# Patient Record
Sex: Male | Born: 1955 | Race: Black or African American | Hispanic: No | Marital: Married | Smoking: Former smoker
Health system: Southern US, Community
[De-identification: ages and names within clinical notes are randomized; demographics above are authoritative.]

## PROBLEM LIST (undated history)

## (undated) DIAGNOSIS — J189 Pneumonia, unspecified organism: Secondary | ICD-10-CM

## (undated) DIAGNOSIS — N183 Chronic kidney disease, stage 3 unspecified: Secondary | ICD-10-CM

## (undated) DIAGNOSIS — I509 Heart failure, unspecified: Secondary | ICD-10-CM

## (undated) DIAGNOSIS — I10 Essential (primary) hypertension: Secondary | ICD-10-CM

## (undated) DIAGNOSIS — N186 End stage renal disease: Secondary | ICD-10-CM

## (undated) DIAGNOSIS — E1142 Type 2 diabetes mellitus with diabetic polyneuropathy: Secondary | ICD-10-CM

## (undated) DIAGNOSIS — I4891 Unspecified atrial fibrillation: Secondary | ICD-10-CM

## (undated) DIAGNOSIS — Z992 Dependence on renal dialysis: Secondary | ICD-10-CM

## (undated) DIAGNOSIS — G4730 Sleep apnea, unspecified: Secondary | ICD-10-CM

## (undated) DIAGNOSIS — R011 Cardiac murmur, unspecified: Secondary | ICD-10-CM

## (undated) DIAGNOSIS — E119 Type 2 diabetes mellitus without complications: Secondary | ICD-10-CM

## (undated) DIAGNOSIS — F1011 Alcohol abuse, in remission: Secondary | ICD-10-CM

## (undated) DIAGNOSIS — R569 Unspecified convulsions: Secondary | ICD-10-CM

## (undated) HISTORY — DX: Chronic kidney disease, stage 3 (moderate): N18.3

## (undated) HISTORY — DX: Chronic kidney disease, stage 3 unspecified: N18.30

## (undated) HISTORY — DX: Type 2 diabetes mellitus without complications: E11.9

## (undated) HISTORY — DX: Unspecified convulsions: R56.9

## (undated) HISTORY — DX: Alcohol abuse, in remission: F10.11

## (undated) HISTORY — DX: Unspecified atrial fibrillation: I48.91

## (undated) HISTORY — DX: End stage renal disease: N18.6

## (undated) HISTORY — DX: Heart failure, unspecified: I50.9

## (undated) HISTORY — DX: Essential (primary) hypertension: I10

## (undated) HISTORY — PX: COLONOSCOPY W/ BIOPSIES AND POLYPECTOMY: SHX1376

## (undated) HISTORY — DX: Dependence on renal dialysis: Z99.2

## (undated) HISTORY — DX: Type 2 diabetes mellitus with diabetic polyneuropathy: E11.42

---

## 1998-03-29 ENCOUNTER — Ambulatory Visit (HOSPITAL_COMMUNITY): Admission: RE | Admit: 1998-03-29 | Discharge: 1998-03-29 | Payer: Self-pay | Admitting: Cardiology

## 1998-03-29 ENCOUNTER — Encounter: Payer: Self-pay | Admitting: Cardiology

## 2000-01-05 ENCOUNTER — Emergency Department (HOSPITAL_COMMUNITY): Admission: EM | Admit: 2000-01-05 | Discharge: 2000-01-05 | Payer: Self-pay | Admitting: Emergency Medicine

## 2008-07-17 ENCOUNTER — Emergency Department (HOSPITAL_COMMUNITY): Admission: EM | Admit: 2008-07-17 | Discharge: 2008-07-18 | Payer: Self-pay | Admitting: Emergency Medicine

## 2009-08-26 ENCOUNTER — Ambulatory Visit (HOSPITAL_COMMUNITY): Admission: RE | Admit: 2009-08-26 | Discharge: 2009-08-26 | Payer: Self-pay | Admitting: Psychiatry

## 2009-08-28 ENCOUNTER — Other Ambulatory Visit (HOSPITAL_COMMUNITY): Admission: RE | Admit: 2009-08-28 | Discharge: 2009-10-31 | Payer: Self-pay | Admitting: Psychiatry

## 2009-09-25 ENCOUNTER — Ambulatory Visit: Payer: Self-pay | Admitting: Psychiatry

## 2010-07-06 LAB — URINE DRUGS OF ABUSE SCREEN W ALC, ROUTINE (REF LAB)
Amphetamine Screen, Ur: NEGATIVE
Amphetamine Screen, Ur: NEGATIVE
Amphetamine Screen, Ur: NEGATIVE
Barbiturate Quant, Ur: NEGATIVE
Barbiturate Quant, Ur: NEGATIVE
Barbiturate Quant, Ur: NEGATIVE
Benzodiazepines.: NEGATIVE
Benzodiazepines.: NEGATIVE
Benzodiazepines.: NEGATIVE
Cocaine Metabolites: NEGATIVE
Cocaine Metabolites: NEGATIVE
Cocaine Metabolites: NEGATIVE
Creatinine,U: 88.4 mg/dL
Creatinine,U: 102 mg/dL
Creatinine,U: 82.9 mg/dL
Ethyl Alcohol: <10 mg/dL (ref <10)
Ethyl Alcohol: <10 mg/dL (ref <10)
Ethyl Alcohol: <10 mg/dL (ref <10)
Marijuana Metabolite: NEGATIVE
Marijuana Metabolite: NEGATIVE
Marijuana Metabolite: NEGATIVE
Methadone: NEGATIVE
Methadone: NEGATIVE
Methadone: NEGATIVE
Opiate Screen, Urine: NEGATIVE
Opiate Screen, Urine: NEGATIVE
Opiate Screen, Urine: NEGATIVE
Phencyclidine (PCP): NEGATIVE
Phencyclidine (PCP): NEGATIVE
Phencyclidine (PCP): NEGATIVE
Propoxyphene: NEGATIVE
Propoxyphene: NEGATIVE
Propoxyphene: NEGATIVE

## 2010-07-07 LAB — URINE DRUGS OF ABUSE SCREEN W ALC, ROUTINE (REF LAB)
Amphetamine Screen, Ur: NEGATIVE
Amphetamine Screen, Ur: NEGATIVE
Amphetamine Screen, Ur: NEGATIVE
Amphetamine Screen, Ur: NEGATIVE
Amphetamine Screen, Ur: NEGATIVE
Barbiturate Quant, Ur: NEGATIVE
Barbiturate Quant, Ur: NEGATIVE
Barbiturate Quant, Ur: NEGATIVE
Barbiturate Quant, Ur: NEGATIVE
Benzodiazepines.: NEGATIVE
Benzodiazepines.: NEGATIVE
Benzodiazepines.: NEGATIVE
Benzodiazepines.: NEGATIVE
Benzodiazepines.: NEGATIVE
Cocaine Metabolites: NEGATIVE
Cocaine Metabolites: NEGATIVE
Cocaine Metabolites: NEGATIVE
Cocaine Metabolites: NEGATIVE
Cocaine Metabolites: NEGATIVE
Creatinine,U: 138.7 mg/dL
Creatinine,U: 88.4 mg/dL
Creatinine,U: 75.7 mg/dL
Creatinine,U: 84.7 mg/dL
Ethyl Alcohol: <10 mg/dL
Ethyl Alcohol: <10 mg/dL
Ethyl Alcohol: <10 mg/dL
Ethyl Alcohol: <10 mg/dL
Marijuana Metabolite: NEGATIVE
Marijuana Metabolite: NEGATIVE
Marijuana Metabolite: NEGATIVE
Marijuana Metabolite: NEGATIVE
Marijuana Metabolite: NEGATIVE
Methadone: NEGATIVE
Methadone: NEGATIVE
Methadone: NEGATIVE
Methadone: NEGATIVE
Methadone: NEGATIVE
Opiate Screen, Urine: NEGATIVE
Opiate Screen, Urine: NEGATIVE
Opiate Screen, Urine: NEGATIVE
Opiate Screen, Urine: NEGATIVE
Opiate Screen, Urine: NEGATIVE
Phencyclidine (PCP): NEGATIVE
Phencyclidine (PCP): NEGATIVE
Phencyclidine (PCP): NEGATIVE
Phencyclidine (PCP): NEGATIVE
Propoxyphene: NEGATIVE
Propoxyphene: NEGATIVE
Propoxyphene: NEGATIVE
Propoxyphene: NEGATIVE
Propoxyphene: NEGATIVE

## 2011-04-25 ENCOUNTER — Other Ambulatory Visit: Payer: Self-pay | Admitting: Cardiology

## 2011-06-13 ENCOUNTER — Other Ambulatory Visit: Payer: Self-pay | Admitting: Cardiology

## 2011-07-16 ENCOUNTER — Other Ambulatory Visit: Payer: Self-pay | Admitting: Cardiology

## 2011-09-21 ENCOUNTER — Encounter: Payer: Self-pay | Admitting: *Deleted

## 2011-09-24 ENCOUNTER — Ambulatory Visit (INDEPENDENT_AMBULATORY_CARE_PROVIDER_SITE_OTHER): Payer: Medicare HMO | Admitting: Internal Medicine

## 2011-09-24 ENCOUNTER — Encounter: Payer: Self-pay | Admitting: Internal Medicine

## 2011-09-24 VITALS — BP 118/86 | HR 91 | Ht 72.0 in | Wt 232.0 lb

## 2011-09-24 DIAGNOSIS — I4891 Unspecified atrial fibrillation: Secondary | ICD-10-CM

## 2011-09-24 DIAGNOSIS — I10 Essential (primary) hypertension: Secondary | ICD-10-CM

## 2011-09-24 DIAGNOSIS — E119 Type 2 diabetes mellitus without complications: Secondary | ICD-10-CM

## 2011-09-24 DIAGNOSIS — E1121 Type 2 diabetes mellitus with diabetic nephropathy: Secondary | ICD-10-CM | POA: Insufficient documentation

## 2011-09-24 DIAGNOSIS — R609 Edema, unspecified: Secondary | ICD-10-CM

## 2011-09-24 MED ORDER — APIXABAN 5 MG PO TABS: 5.0000 mg | ORAL_TABLET | Freq: Two times a day (BID) | ORAL | Status: DC

## 2011-09-24 MED ORDER — LISINOPRIL-HYDROCHLOROTHIAZIDE 20-12.5 MG PO TABS: ORAL_TABLET | ORAL | Status: DC

## 2011-09-24 NOTE — Assessment & Plan Note (Signed)
Pt identified with asymptomatic atrial fibrillation   His CHADS score is 2 and thus he is appropriatedly treated with oral anticoagulation and I would choose a NOAC as opposed to coumadin  Apparently his Cr may be about 2 so  apixoban or Rivaroxaban would be better than coumadin  We will review lab work and see if a primary cause is idenified, ie thyroid or anemia and do an echo to look at LV and LA structure  We discussed side effects of blood thinners and would plan DCCV in 4 weeks or so

## 2011-09-24 NOTE — Patient Instructions (Signed)
Your physician has recommended you make the following change in your medication:  1) Stop exforge. 2) Start lisinopril/hct 20/12.5 mg one tablet by mouth twice daily. 3) You are being samples of Eliquis 5 mg samples to take- this will be twice daily. **Do not start Eliquis until we are able to obtain your lab work from Dr. Magda Kiel office.  Your physician has requested that you have an echocardiogram. Echocardiography is a painless test that uses sound waves to create images of your heart. It provides your doctor with information about the size and shape of your heart and how well your heart's chambers and valves are working. This procedure takes approximately one hour. There are no restrictions for this procedure.  Your physician has recommended that you have a Cardioversion (DCCV). Electrical Cardioversion uses a jolt of electricity to your heart either through paddles or wired patches attached to your chest. This is a controlled, usually prescheduled, procedure. Defibrillation is done under light anesthesia in the hospital, and you usually go home the day of the procedure. This is done to get your heart back into a normal rhythm. You are not awake for the procedure. Please see the instruction sheet given to you today.- Dr. Odessa Fleming nurse will be in touch with you about scheduling this.

## 2011-09-24 NOTE — Assessment & Plan Note (Addendum)
This may be related to his amlodipine. We will change his blood pressure medications to discontinue the amlodipine and put him on lisinopril/HCT. We will begin this following use of his current prescription

## 2011-09-24 NOTE — Progress Notes (Signed)
CARDIOLOGY CONSULT NOTE  Patient ID: Joseph [name], MRN: [id number], DOB/AGE: 56-Jul-1957 56 y.o. Admit date: (Not on file) Date of Consult: 09/24/2011  Primary Physician: No primary provider on file. Primary Cardiologist: JSpruill  Chief Complaint: Afib   HPI Joseph [name] is a 56 y.o. male : Seen at the request of Dr. Shana Chute because of newly identified asymptomatic atrial fibrillation.  He is a past history of diabetes and hypertension which has been complicated by peripheral edema with the increasing dosing of his CHADS score is 2.  Been no change in his exercise tolerance. He snores but has no daytime somnolence or poor resting.    Past Medical History  Diagnosis Date  . Atrial fibrillation     newly diagnosed?  . H/O alcohol abuse   . Diabetes mellitus   . Hypertension       Surgical History: No past surgical history on file.   Home Meds: Prior to Admission medications   Medication Sig Start Date End Date Taking? Authorizing Provider  APIDRA 100 UNIT/ML injection Inject 25 Units into the skin daily.  09/09/11  Yes Historical Provider, MD  DUTOPROL 100-12.5 MG TB24 Take 1 tablet by mouth daily.  09/19/11  Yes Historical Provider, MD  EXFORGE 10-320 MG per tablet Take 1 tablet by mouth daily.  09/19/11  Yes Historical Provider, MD  LANTUS SOLOSTAR 100 UNIT/ML injection Inject into the skin. Sliding scale  09/19/11  Yes Historical Provider, MD  LIPITOR 20 MG tablet Take 20 mg by mouth daily.  09/12/11  Yes Historical Provider, MD    Allergies: No Known Allergies  History   Social History  . Marital Status: Married    Spouse Name: N/A    Number of Children: N/A  . Years of Education: N/A   Occupational History  . Not on file.   Social History Main Topics  . Smoking status: Former Games developer  . Smokeless tobacco: Not on file   Comment: some in college  . Alcohol Use: Yes     occasionally beer  . Drug Use: No  . Sexually Active: Not on file   Other Topics Concern  .  Not on file   Social History Narrative  . No narrative on file     Family History  Problem Relation Age of Onset  . Heart disease Mother      ROS:  Please see the history of present illness.   Negative except glasses  All other systems reviewed and negative.    Physical Exam: Blood pressure 118/86, pulse 91, height 6' (1.829 m), weight 232 lb (105.235 kg), SpO2 99.00%. General: Well developed, well nourished male in no acute distress. Head: Normocephalic, atraumatic, sclera non-icteric, no xanthomas, nares are without discharge. Lymph Nodes:  none Neck: Negative for carotid bruits. JVD not elevated. Lungs: Clear bilaterally to auscultation without wheezes, rales, or rhonchi. Breathing is unlabored. Heart: Irregular oh RR with S1 S2. No murmurs, rubs, or gallops appreciated. Abdomen: Soft, non-tender, non-distended with normoactive bowel sounds. No hepatomegaly. No rebound/guarding. No obvious abdominal masses. Msk:  Strength and tone appear normal for age. Extremities: No clubbing or cyanosis.  1+ edema.  Distal pedal pulses are 2+ and equal bilaterally. Skin: Warm and Dry Neuro: Alert and oriented X 3. CN III-XII intact Grossly normal sensory and motor function . Psych:  Responds to questions appropriately with a normal affect.       EKG: Atrial fibrillation with a controlled ventricular response   Assessment and Plan:  [name]

## 2011-09-24 NOTE — Assessment & Plan Note (Signed)
As above.

## 2011-09-28 ENCOUNTER — Telehealth: Payer: Self-pay | Admitting: *Deleted

## 2011-09-28 NOTE — Telephone Encounter (Signed)
I spoke with the patient about his lab results and that it is ok to start Eliquis 5 mg twice daily per Dr. Graciela Husbands. I have scheduled him for his echo to be done on 10/05/11. I will call him back tomorrow about setting up his DCCV for 4 weeks outl. He will see if 10/30/11 will work with his wife's schedule.

## 2011-09-28 NOTE — Telephone Encounter (Signed)
Left VM for patient to call to schedule Echocardiogram.

## 2011-09-29 ENCOUNTER — Emergency Department (HOSPITAL_COMMUNITY)
Admission: EM | Admit: 2011-09-29 | Discharge: 2011-09-29 | Disposition: A | Discharge status: Home / Self Care | Payer: Managed Care, Other (non HMO) | Attending: Emergency Medicine | Admitting: Emergency Medicine

## 2011-09-29 ENCOUNTER — Encounter (HOSPITAL_COMMUNITY): Payer: Self-pay | Admitting: *Deleted

## 2011-09-29 DIAGNOSIS — H3320 Serous retinal detachment, unspecified eye: Secondary | ICD-10-CM | POA: Insufficient documentation

## 2011-09-29 DIAGNOSIS — Z87891 Personal history of nicotine dependence: Secondary | ICD-10-CM | POA: Insufficient documentation

## 2011-09-29 DIAGNOSIS — H3322 Serous retinal detachment, left eye: Secondary | ICD-10-CM

## 2011-09-29 DIAGNOSIS — I10 Essential (primary) hypertension: Secondary | ICD-10-CM | POA: Insufficient documentation

## 2011-09-29 DIAGNOSIS — Z794 Long term (current) use of insulin: Secondary | ICD-10-CM | POA: Insufficient documentation

## 2011-09-29 DIAGNOSIS — E119 Type 2 diabetes mellitus without complications: Secondary | ICD-10-CM | POA: Insufficient documentation

## 2011-09-29 DIAGNOSIS — I4891 Unspecified atrial fibrillation: Secondary | ICD-10-CM | POA: Insufficient documentation

## 2011-09-29 LAB — GLUCOSE, CAPILLARY: Glucose-Capillary: 200 mg/dL — ABNORMAL HIGH (ref 70–99)

## 2011-09-29 NOTE — ED Provider Notes (Signed)
History     CSN: [id number]  Arrival date & time 09/29/11  4098   First MD Initiated Contact with Patient 09/29/11 0530      Chief Complaint  Patient presents with  . Eye Problem    (Consider location/radiation/quality/duration/timing/severity/associated sxs/prior treatment) HPI 56 yo male presents to the ER with complaint of left eye vision problems.  Pt reports he woke this morning around 3 am, and noticed while watching TV thought the screen was dirty.  When patient tried to wipe off the screen, he realized that it was his vision.  Pt reports a general haze in his left eye vision along with black lines.  Pt with h/o diabetes, has seen ophtho in the past and was seen by an eye specialist, but has not had recent visit and does not know their names.  Pt with poor vision at baseline, but does report his left eye is worse than usual.  No prior h/o floaters or retinal detachment.  Pt reports he feels like the arteries in his eye have "blown up".    Past Medical History  Diagnosis Date  . Atrial fibrillation     newly diagnosed?  . H/O alcohol abuse   . Hypertension   . Diabetes mellitus     History reviewed. No pertinent past surgical history.  Family History  Problem Relation Age of Onset  . Heart disease Mother     History  Substance Use Topics  . Smoking status: Former Games developer  . Smokeless tobacco: Not on file   Comment: some in college  . Alcohol Use: Yes     occasionally beer      Review of Systems  All other systems reviewed and are negative.    Allergies  Review of patient's allergies indicates no known allergies.  Home Medications   Current Outpatient Rx  Name Route Sig Dispense Refill  . AMLODIPINE BESYLATE-VALSARTAN 10-320 MG PO TABS Oral Take 1 tablet by mouth daily.    . APIDRA 100 UNIT/ML IJ SOLN Subcutaneous Inject 25 Units into the skin daily.     . APIXABAN 5 MG PO TABS Oral Take 1 tablet (5 mg total) by mouth 2 (two) times daily. 60 tablet 11    . DUTOPROL 100-12.5 MG PO TB24 Oral Take 1 tablet by mouth daily.     Marland Kitchen LANTUS SOLOSTAR 100 UNIT/ML  SOLN Subcutaneous Inject into the skin. Sliding scale     . LIPITOR 20 MG PO TABS Oral Take 20 mg by mouth daily.       BP 143/90  Pulse 69  Temp(Src) 97.5 F (36.4 C) (Oral)  Resp 19  SpO2 97%  Physical Exam  Constitutional: He is oriented to person, place, and time. He appears well-developed and well-nourished. No distress.  HENT:  Head: Normocephalic and atraumatic.  Nose: Nose normal.  Mouth/Throat: Oropharynx is clear and moist. No oropharyngeal exudate.  Eyes: Conjunctivae and EOM are normal. Pupils are equal, round, and reactive to light. Right eye exhibits no discharge. Left eye exhibits no discharge. No scleral icterus.       Fundoscopic exam shows av nicking bilaterally, cotton wool spots.  Left eye with possible retinal detachment, but pt not tolerating exam well  Neck: Normal range of motion. Neck supple. No JVD present. No tracheal deviation present. No thyromegaly present.  Pulmonary/Chest: No stridor.  Musculoskeletal: Normal range of motion. He exhibits no edema and no tenderness.  Lymphadenopathy:    He has no cervical adenopathy.  Neurological: He  is alert and oriented to person, place, and time. He exhibits normal muscle tone. Coordination normal.  Skin: Skin is warm and dry. No rash noted. He is not diaphoretic. No erythema. No pallor.    ED Course  Procedures (including critical care time)  Labs Reviewed - No data to display No results found.   1. Retinal detachment, left       MDM  56 yo male with acute onset of reduced visual acuity, concern for retinal detachment.  D/w Dr Luciana Axe who will see the patient in his office at 0830.  He requests the patient not eat or drink until being seen by him.  Pt requesting later appointment so that he can have breakfast, as he hasn't eaten all night.  Explained to patient the need for NPO given potential  intervention this am.  Will check cbg.         Olivia Mackie, MD 09/29/11 [phone]

## 2011-09-29 NOTE — ED Notes (Signed)
Pt ambulated with a steady gait; VSS; A&Ox3; no signs of distress; respirations even and unlabored; skin warm and dry; no questions at this time; pt will follow up today.

## 2011-09-29 NOTE — ED Notes (Signed)
Pt presented to ED with lt eye blurred vision .Pt was watching TV and experienced sudden change with vision of the left eye.No other complains.Pt wears eye glasses all the time.

## 2011-09-29 NOTE — Discharge Instructions (Signed)
Please go to Dr Rankin's office this morning to be seen at 0830.  It is very important to keep this appointment.  DO NOT EAT OR DRINK PRIOR TO THIS APPOINTMENT  Retinal Detachment The retina is a thin light-sensitive layer that lines the inside of the back of the eye. The retina changes visual images into nerve impulses which are sent to the brain by the optic nerve to produce sight. It must be attached to the back of the eye in order to function. All detailed vision and color perception happens at a precise pin size point of focus on the retina called the macula. The other 99.9% of retinal surface is for side vision (what your eye can see just off to the side of what you are looking at). A detached retina is the separation of the retina from the back of the eye. A total retinal detachment is when the entire retina separates from the layers below it, and includes the macula. This causes complete blindness in the affected eye. It is more common for just a portion of the retina to detach, in many cases leaving the macula in place. When this happens, central, focused vision is maintained, but a portion of side vision corresponding to the part of the retina that detached is lost. This results in a portion of side vision that is black - like a curtain covering a portion of side vision. CAUSES   One or many small holes or tears in the retinal surface. These holes and tears allow the fluids inside the eyeball (vitreous fluid) to get through the opening and lift the retina up and away from its underlying layers. Since the vitreous fluid is jelly-like in nature and constricts with time, it has the affect of pulling on the retina and can cause holes or tears. People who are very near sighted are at a higher risk for retinal holes or tears.   Trauma to the eye.   Certain diseases (diabetes, blood clotting disorders and others).   Certain physical abnormalities of the eye.  SYMPTOMS   Flashes of lights that are  caused by the vitreous pulling on the retina.   Floating specks or cobwebs (floaters) seen in front of your eye. These may be caused by bleeding in the eye from a hole or tear of the retina.   A black area or "curtain" that you cannot see through in any part of your side vision.   Detailed vision becomes fuzzy which may be caused by the vitreous pulling on the macula.   A floating empty circle in front of your vision which may be caused by the vitreous pulling away from the optic nerve. This is very common and usually does not affect vision (posterior vitreal detachment).  DIAGNOSIS  The diagnosis is made with an exam by an ophthalmologist. The pupils of the patient are made larger (dilated). Retinal detachment can be more easily found if the detachment is large. If it is small or flat, the detachment may be harder to find. Location of retinal holes and tears need an extremely high degree of training and skill. They may need the expertise of an ophthalmologist who specializes in diseases of the retina. Both eyes should be thoroughly examined since holes or tears may exist in both eyes. TREATMENT  Treatment depends on the location, size and nature of the retinal detachment.  Small, localized detachments may be treated on an outpatient basis using a laser.   Larger detachments need surgery to  drain the fluid and use a freezing probe to create scar tissue. The scar tissue will help make the retina adhere to its underlying tissues once it flattens out.   Methods also exist which involve the injection of fluid or air into the vitreous cavity.  Visual improvement or return depends on:  How long the retina was detached.   Whether or not the macula was involved.   How well any blood within the vitreous cavity clears with time - and many other factors.  HOME CARE INSTRUCTIONS   If you have a retinal detachment, and you have not been examined by an eye specialist (either an Ophthalmologist or a  Retinal Specialist), you must be examined by one of these specialists as soon as possible.   Home care instruction should be given by the retina specialist depending on the type of detachment and the surgery or method that was used to treat it.  SEEK IMMEDIATE MEDICAL CARE IF:   You see the sudden onset of flashing lights, floaters and/or a dark area (like a curtain) in any area of your field of version.   The dark area of visual loss is in the lower part of the visual field. This means that the upper retina may have detached. Since fluid runs down, the risk is greater in this type of detachment that the fluid may work its way downward to detach the macula. Any detachment that involves the macula makes the risk of permanent visual loss much higher, even with treatment.  Document Released: 04/06/2005 Document Revised: 03/26/2011 Document Reviewed: 03/08/2009 [facility] Patient Information 2012 [place], [place].

## 2011-09-29 NOTE — ED Notes (Signed)
The pt has floaters in his lt eye.  He has strings in his lt eye vision that appears to be blood

## 2011-09-29 NOTE — Telephone Encounter (Signed)
Per Soledad Gerlach at checkin, there should not be a co-pay for the patient's echo. I have left a message for him to call about this and to schedule a DCCV for 4 weeks out.

## 2011-10-05 ENCOUNTER — Ambulatory Visit (HOSPITAL_COMMUNITY): Payer: Managed Care, Other (non HMO) | Attending: Cardiology

## 2011-10-05 DIAGNOSIS — I4891 Unspecified atrial fibrillation: Secondary | ICD-10-CM

## 2011-10-05 DIAGNOSIS — E119 Type 2 diabetes mellitus without complications: Secondary | ICD-10-CM | POA: Insufficient documentation

## 2011-10-05 DIAGNOSIS — I10 Essential (primary) hypertension: Secondary | ICD-10-CM

## 2011-10-05 DIAGNOSIS — R609 Edema, unspecified: Secondary | ICD-10-CM

## 2011-10-05 DIAGNOSIS — F1010 Alcohol abuse, uncomplicated: Secondary | ICD-10-CM | POA: Insufficient documentation

## 2011-10-05 NOTE — Progress Notes (Signed)
Echocardiogram performed.  

## 2011-10-27 ENCOUNTER — Ambulatory Visit: Payer: Managed Care, Other (non HMO) | Admitting: Internal Medicine

## 2011-10-27 ENCOUNTER — Encounter: Payer: Self-pay | Admitting: Internal Medicine

## 2011-10-27 ENCOUNTER — Ambulatory Visit (INDEPENDENT_AMBULATORY_CARE_PROVIDER_SITE_OTHER): Payer: Managed Care, Other (non HMO) | Admitting: Internal Medicine

## 2011-10-27 ENCOUNTER — Encounter: Payer: Self-pay | Admitting: *Deleted

## 2011-10-27 VITALS — BP 132/90 | HR 88 | Resp 18 | Ht 72.0 in | Wt 227.8 lb

## 2011-10-27 DIAGNOSIS — I4891 Unspecified atrial fibrillation: Secondary | ICD-10-CM

## 2011-10-27 DIAGNOSIS — I10 Essential (primary) hypertension: Secondary | ICD-10-CM

## 2011-10-27 DIAGNOSIS — N289 Disorder of kidney and ureter, unspecified: Secondary | ICD-10-CM

## 2011-10-27 DIAGNOSIS — E785 Hyperlipidemia, unspecified: Secondary | ICD-10-CM

## 2011-10-27 DIAGNOSIS — G4733 Obstructive sleep apnea (adult) (pediatric): Secondary | ICD-10-CM

## 2011-10-27 DIAGNOSIS — G4730 Sleep apnea, unspecified: Secondary | ICD-10-CM

## 2011-10-27 NOTE — Patient Instructions (Addendum)
Your physician recommends that you have lab work today: bmp/tsh  Your physician has recommended that you have a sleep study (home study). This test records several body functions during sleep, including: brain activity, eye movement, oxygen and carbon dioxide blood levels, heart rate and rhythm, breathing rate and rhythm, the flow of air through your mouth and nose, snoring, body muscle movements, and chest and belly movement.  You have been referred to : Nephrology (renal).  Your physician has recommended that you have a Cardioversion (DCCV). Electrical Cardioversion uses a jolt of electricity to your heart either through paddles or wired patches attached to your chest. This is a controlled, usually prescheduled, procedure. Defibrillation is done under light anesthesia in the hospital, and you usually go home the day of the procedure. This is done to get your heart back into a normal rhythm. You are not awake for the procedure. Please see the instruction sheet given to you today.

## 2011-10-27 NOTE — Assessment & Plan Note (Signed)
Clinically has a diagnosis. We will try and see if he qualifies for home sleep study; otherwise we will send him to the hospital lab

## 2011-10-27 NOTE — Progress Notes (Signed)
  HPI  Joseph [name] is a 56 y.o. male  Seen in followup for atrial fibrillation newly identified in June. We anticipate cardioversion on the introduction of apixoban . Echocardiogram demonstrated normal left ventricular function with mild left atrial enlargement.   He is modest renal insufficiency with a creatinine of 2.2; estimated creatinine clearance is 53  When I asked his wife whether he snores, she simply frowned and laughted  Past Medical History  Diagnosis Date  . Atrial fibrillation     newly diagnosed?  . H/O alcohol abuse   . Hypertension   . Diabetes mellitus     No past surgical history on file.  Current Outpatient Prescriptions  Medication Sig Dispense Refill  . APIDRA 100 UNIT/ML injection Inject 25 Units into the skin daily.       . apixaban (ELIQUIS) 5 MG TABS tablet Take 1 tablet (5 mg total) by mouth 2 (two) times daily.  60 tablet  11  . DUTOPROL 100-12.5 MG TB24 Take 1 tablet by mouth daily.       . LANTUS SOLOSTAR 100 UNIT/ML injection Inject 50 Units into the skin. Sliding scale      . LIPITOR 20 MG tablet Take 20 mg by mouth daily.       . lisinopril-hydrochlorothiazide (PRINZIDE,ZESTORETIC) 20-12.5 MG per tablet         No Known Allergies  Review of Systems negative except from HPI and PMH  Physical Exam BP 132/90  Pulse 88  Resp 18  Ht 6' (1.829 m)  Wt 227 lb 12.8 oz (103.329 kg)  BMI 30.90 kg/m2  SpO2 99% Well developed and well nourished in no acute distress HENT normal E scleral and icterus clear Neck Supple JVP flat; carotids brisk and full Clear to ausculation irRegular rate and rhythm, no murmurs gallops or rub Soft with active bowel sounds No clubbing cyanosis none Edema Alert and oriented, grossly normal motor and sensory function Skin Warm and Dry    Assessment and  Plan  

## 2011-10-27 NOTE — Assessment & Plan Note (Signed)
Improved on current medications.

## 2011-10-27 NOTE — Assessment & Plan Note (Signed)
With his diabetic nephropathy, we will refer him to nephrology

## 2011-10-27 NOTE — Assessment & Plan Note (Signed)
LDL was over 200. We'll need to review his status

## 2011-10-27 NOTE — Assessment & Plan Note (Signed)
We'll check his TSH. We'll plan to proceed with cardioversion. He is currently taking apixoban and we need to keep track of his creatinine as it may affect his dosing

## 2011-10-28 LAB — BASIC METABOLIC PANEL
CO2: 26 mEq/L (ref 19–32)
Calcium: 8.4 mg/dL (ref 8.4–10.5)
Chloride: 106 mEq/L (ref 96–112)
Potassium: 4.3 mEq/L (ref 3.5–5.1)
Sodium: 139 mEq/L (ref 135–145)

## 2011-10-30 NOTE — Addendum Note (Signed)
Addended by: Lacie Scotts on: 10/30/2011 04:04 PM   Modules accepted: Orders

## 2011-11-02 ENCOUNTER — Encounter (HOSPITAL_COMMUNITY): Payer: Self-pay | Admitting: Pharmacy Technician

## 2011-11-02 ENCOUNTER — Encounter: Payer: Self-pay | Admitting: Internal Medicine

## 2011-11-03 ENCOUNTER — Telehealth: Payer: Self-pay | Admitting: Internal Medicine

## 2011-11-03 NOTE — Telephone Encounter (Signed)
I spoke with Victorino Dike at Dr. Ephriam Knuckles office. The patient is needing clearance to undergo a vitrectomy endo laser due to viseral hemorrhage and diabetic retinopathy. This would be done under local anesthesia and the patient would not need to stop his eliquis per Dr. Luciana Axe. Plans would be to do this next Wednesday. I advised I would discuss with Dr. Graciela Husbands for clearance and be back in touch with their office on Friday.

## 2011-11-03 NOTE — Telephone Encounter (Signed)
New problem:  Need cardiac clearance for rectum surgery.

## 2011-11-04 NOTE — Telephone Encounter (Signed)
Pt is currently on track for cardioversion  It is ok from cardiac perspective to have his retinal surgery, but if it is done, it will cause to reset his DCCV schedule to 4 weeks post resumpton of the anticoagulation

## 2011-11-06 ENCOUNTER — Encounter: Payer: Self-pay | Admitting: *Deleted

## 2011-11-06 NOTE — Telephone Encounter (Signed)
I called Dr. Ephriam Knuckles office to verify the fax number we have of [phone] is correct. This is the right number, but I have been unable to get this fax to go through for clearance x 2 tries. I will re-attempt.

## 2011-11-06 NOTE — Telephone Encounter (Signed)
Letter faxed.

## 2011-11-09 ENCOUNTER — Other Ambulatory Visit: Payer: Self-pay | Admitting: Internal Medicine

## 2011-11-09 DIAGNOSIS — I4891 Unspecified atrial fibrillation: Secondary | ICD-10-CM

## 2011-11-11 ENCOUNTER — Other Ambulatory Visit: Payer: Self-pay | Admitting: Internal Medicine

## 2011-11-11 HISTORY — PX: OTHER SURGICAL HISTORY: SHX169

## 2011-11-17 ENCOUNTER — Telehealth: Payer: Self-pay | Admitting: *Deleted

## 2011-11-17 MED ORDER — SODIUM CHLORIDE 0.9 % IV SOLN: INTRAVENOUS | Status: AC

## 2011-11-17 NOTE — Telephone Encounter (Signed)
PT AWARE OF TIME CHANGE FOR CARDIOVERSION  FROM 10:00 TO 11:00 AM  PT TO ARRIVE AT HOSPITAL AT 9:00 AM INSTEAD OF 8:00 AM PT SOUNDED UPSET OVER PHONE RE CHANGING PROCEDURE INFORMED PT THAT MD  DOING PROCEDURE COULD NOT BE THERE AT  10:00AM PT HUNG UP ON THIS NURSE  WHEN WAS ABOUT  TO ASK PT IF NEEDED TO  CHANGE TO ANOTHER DAY .

## 2011-11-18 ENCOUNTER — Ambulatory Visit (HOSPITAL_COMMUNITY)
Admission: RE | Admit: 2011-11-18 | Discharge: 2011-11-18 | Disposition: A | Discharge status: Home / Self Care | Payer: Managed Care, Other (non HMO) | Source: Ambulatory Visit | Attending: Cardiovascular Disease | Admitting: Cardiovascular Disease

## 2011-11-18 ENCOUNTER — Encounter (HOSPITAL_COMMUNITY): Payer: Self-pay | Admitting: Critical Care Medicine

## 2011-11-18 ENCOUNTER — Ambulatory Visit (HOSPITAL_COMMUNITY): Payer: Managed Care, Other (non HMO) | Admitting: Critical Care Medicine

## 2011-11-18 ENCOUNTER — Encounter (HOSPITAL_COMMUNITY)
Admission: RE | Disposition: A | Discharge status: Home / Self Care | Payer: Self-pay | Source: Ambulatory Visit | Attending: Cardiovascular Disease

## 2011-11-18 ENCOUNTER — Encounter (HOSPITAL_COMMUNITY): Payer: Self-pay | Admitting: Gastroenterology

## 2011-11-18 DIAGNOSIS — I4891 Unspecified atrial fibrillation: Secondary | ICD-10-CM | POA: Insufficient documentation

## 2011-11-18 DIAGNOSIS — I10 Essential (primary) hypertension: Secondary | ICD-10-CM | POA: Insufficient documentation

## 2011-11-18 DIAGNOSIS — E119 Type 2 diabetes mellitus without complications: Secondary | ICD-10-CM | POA: Insufficient documentation

## 2011-11-18 HISTORY — PX: CARDIOVERSION: SHX1299

## 2011-11-18 HISTORY — DX: Cardiac murmur, unspecified: R01.1

## 2011-11-18 SURGERY — CARDIOVERSION
Anesthesia: General

## 2011-11-18 MED ORDER — MIDAZOLAM HCL 10 MG/2ML IJ SOLN
INTRAMUSCULAR | Status: AC
  Filled 2011-11-18: qty 2

## 2011-11-18 MED ORDER — SODIUM CHLORIDE 0.9 % IV SOLN
INTRAVENOUS | Status: DC
  Administered 2011-11-18: 500 mL via INTRAVENOUS

## 2011-11-18 MED ORDER — FENTANYL CITRATE 0.05 MG/ML IJ SOLN
INTRAMUSCULAR | Status: AC
  Filled 2011-11-18: qty 2

## 2011-11-18 MED ORDER — PROPOFOL 10 MG/ML IV EMUL
INTRAVENOUS | Status: DC | PRN
  Administered 2011-11-18 (×2): 80 mg via INTRAVENOUS

## 2011-11-18 NOTE — CV Procedure (Signed)
NPO On Rx Eloquis BS 174 Rhythm Atrial fibrillation/flutter rate 110 Anesthesia Dr Randa Evens 120 mg Propofol Single 150J biphasic shock Converted to NSR rate 72 No immediate neurologic sequelae  F/U Dr Blaine Hamper in 3-4 weeks  Charlton Haws 11:26 AM 11/18/2011

## 2011-11-18 NOTE — Interval H&P Note (Signed)
History and Physical Interval Note:  11/18/2011 10:52 AM  [name]  has presented today for surgery, with the diagnosis of AFIB  The various methods of treatment have been discussed with the patient and family. After consideration of risks, benefits and other options for treatment, the patient has consented to  Procedure(s) (LRB): CARDIOVERSION (N/A) as a surgical intervention .  The patient's history has been reviewed, patient examined, no change in status, stable for surgery.  I have reviewed the patient's chart and labs.  Questions were answered to the patient's satisfaction.     Joseph [name]

## 2011-11-18 NOTE — Transfer of Care (Addendum)
Immediate Anesthesia Transfer of Care Note  Patient: Joseph [name]  Procedure(s) Performed: Procedure(s) (LRB): CARDIOVERSION (N/A)  Patient Location: endoscopy unit  Anesthesia Type: General  Level of Consciousness: awake, alert  and oriented  Airway & Oxygen Therapy: Patient Spontanous Breathing and Patient connected to nasal cannula oxygen  Post-op Assessment: Report given to PACU RN, Post -op Vital signs reviewed and stable and Patient moving all extremities X 4  Post vital signs: Reviewed and stable  Complications: No apparent anesthesia complications

## 2011-11-18 NOTE — Anesthesia Postprocedure Evaluation (Signed)
  Anesthesia Post-op Note  Patient: Joseph [name]  Procedure(s) Performed: Procedure(s) (LRB): CARDIOVERSION (N/A)  Patient Location: PACU and Endoscopy Unit  Anesthesia Type: General  Level of Consciousness: awake, alert  and oriented  Airway and Oxygen Therapy: Patient Spontanous Breathing and Patient connected to nasal cannula oxygen  Post-op Pain: none  Post-op Assessment: Post-op Vital signs reviewed, Patient's Cardiovascular Status Stable, Respiratory Function Stable, Patent Airway and No signs of Nausea or vomiting  Post-op Vital Signs: Reviewed and stable  Complications: No apparent anesthesia complications

## 2011-11-18 NOTE — Anesthesia Preprocedure Evaluation (Addendum)
Anesthesia Evaluation  Patient identified by MRN, date of birth, ID band Patient awake    Reviewed: Allergy & Precautions, H&P , NPO status , Patient's Chart, lab work & pertinent test results  Airway Mallampati: II TM Distance: >3 FB Neck ROM: Full    Dental  (+) Dental Advisory Given, Lower Dentures and Upper Dentures   Pulmonary sleep apnea ,  breath sounds clear to auscultation        Cardiovascular hypertension, Pt. on medications + dysrhythmias Atrial Fibrillation Rhythm:Regular Rate:Normal     Neuro/Psych    GI/Hepatic negative GI ROS, Neg liver ROS,   Endo/Other    Renal/GU negative Renal ROS     Musculoskeletal   Abdominal   Peds  Hematology   Anesthesia Other Findings   Reproductive/Obstetrics                          Anesthesia Physical Anesthesia Plan  ASA: III  Anesthesia Plan: General   Post-op Pain Management:    Induction: Intravenous  Airway Management Planned: Mask  Additional Equipment:   Intra-op Plan:   Post-operative Plan:   Informed Consent: I have reviewed the patients History and Physical, chart, labs and discussed the procedure including the risks, benefits and alternatives for the proposed anesthesia with the patient or authorized representative who has indicated his/her understanding and acceptance.   Dental advisory given  Plan Discussed with: Anesthesiologist, Surgeon and CRNA  Anesthesia Plan Comments:        Anesthesia Quick Evaluation

## 2011-11-18 NOTE — Preoperative (Signed)
Beta Blockers   Reason not to administer Beta Blockers:Not Applicable 

## 2011-11-18 NOTE — H&P (View-Only) (Signed)
  HPI  Joseph [name] is a 56 y.o. male  Seen in followup for atrial fibrillation newly identified in June. We anticipate cardioversion on the introduction of apixoban . Echocardiogram demonstrated normal left ventricular function with mild left atrial enlargement.   He is modest renal insufficiency with a creatinine of 2.2; estimated creatinine clearance is 53  When I asked his wife whether he snores, she simply frowned and laughted  Past Medical History  Diagnosis Date  . Atrial fibrillation     newly diagnosed?  . H/O alcohol abuse   . Hypertension   . Diabetes mellitus     No past surgical history on file.  Current Outpatient Prescriptions  Medication Sig Dispense Refill  . APIDRA 100 UNIT/ML injection Inject 25 Units into the skin daily.       Marland Kitchen apixaban (ELIQUIS) 5 MG TABS tablet Take 1 tablet (5 mg total) by mouth 2 (two) times daily.  60 tablet  11  . DUTOPROL 100-12.5 MG TB24 Take 1 tablet by mouth daily.       Marland Kitchen LANTUS SOLOSTAR 100 UNIT/ML injection Inject 50 Units into the skin. Sliding scale      . LIPITOR 20 MG tablet Take 20 mg by mouth daily.       Marland Kitchen lisinopril-hydrochlorothiazide (PRINZIDE,ZESTORETIC) 20-12.5 MG per tablet         No Known Allergies  Review of Systems negative except from HPI and PMH  Physical Exam BP 132/90  Pulse 88  Resp 18  Ht 6' (1.829 m)  Wt 227 lb 12.8 oz (103.329 kg)  BMI 30.90 kg/m2  SpO2 99% Well developed and well nourished in no acute distress HENT normal E scleral and icterus clear Neck Supple JVP flat; carotids brisk and full Clear to ausculation irRegular rate and rhythm, no murmurs gallops or rub Soft with active bowel sounds No clubbing cyanosis none Edema Alert and oriented, grossly normal motor and sensory function Skin Warm and Dry    Assessment and  Plan

## 2011-11-19 ENCOUNTER — Encounter (HOSPITAL_COMMUNITY): Payer: Self-pay | Admitting: Cardiovascular Disease

## 2011-12-01 ENCOUNTER — Telehealth: Payer: Self-pay | Admitting: *Deleted

## 2011-12-01 NOTE — Telephone Encounter (Signed)
The patient is aware of his sleep study results. 

## 2011-12-01 NOTE — Telephone Encounter (Signed)
I left a message at the patient's home and cell #'s to call regarding his sleep study. Reviewed by Dr. Graciela Husbands today- very mild sleep apnea. No further follow up of this needed.

## 2011-12-02 ENCOUNTER — Telehealth: Payer: Self-pay | Admitting: Internal Medicine

## 2011-12-02 ENCOUNTER — Other Ambulatory Visit: Payer: Self-pay | Admitting: *Deleted

## 2011-12-02 DIAGNOSIS — I4891 Unspecified atrial fibrillation: Secondary | ICD-10-CM

## 2011-12-02 MED ORDER — APIXABAN 5 MG PO TABS: 5.0000 mg | ORAL_TABLET | Freq: Two times a day (BID) | ORAL | Status: DC

## 2011-12-02 NOTE — Telephone Encounter (Signed)
Notified pt that 90 day RX has been sent to [facility] in [place].  He said thank you.     Nikea Settle, CMA

## 2011-12-02 NOTE — Telephone Encounter (Signed)
New Problem:    Patient called in needing a 90 day refill for his apixaban (ELIQUIS) 5 MG TABS tablet.  Please call once the order has been placed.

## 2011-12-16 ENCOUNTER — Telehealth: Payer: Self-pay | Admitting: *Deleted

## 2011-12-16 ENCOUNTER — Ambulatory Visit (INDEPENDENT_AMBULATORY_CARE_PROVIDER_SITE_OTHER): Payer: Managed Care, Other (non HMO) | Admitting: Nurse Practitioner

## 2011-12-16 ENCOUNTER — Encounter: Payer: Self-pay | Admitting: Nurse Practitioner

## 2011-12-16 VITALS — BP 184/102 | HR 102 | Ht 72.0 in | Wt 229.0 lb

## 2011-12-16 DIAGNOSIS — I10 Essential (primary) hypertension: Secondary | ICD-10-CM

## 2011-12-16 DIAGNOSIS — F1010 Alcohol abuse, uncomplicated: Secondary | ICD-10-CM

## 2011-12-16 DIAGNOSIS — I4891 Unspecified atrial fibrillation: Secondary | ICD-10-CM

## 2011-12-16 LAB — BASIC METABOLIC PANEL
BUN: 43 mg/dL — ABNORMAL HIGH (ref 6–23)
Creatinine, Ser: 2.4 mg/dL — ABNORMAL HIGH (ref 0.4–1.5)
GFR: 36.29 mL/min — ABNORMAL LOW (ref >60.00)
Glucose, Bld: 259 mg/dL — ABNORMAL HIGH (ref 70–99)
Potassium: 4.3 mEq/L (ref 3.5–5.1)

## 2011-12-16 MED ORDER — METOPROLOL TARTRATE 100 MG PO TABS: 100.0000 mg | ORAL_TABLET | Freq: Two times a day (BID) | ORAL | Status: DC

## 2011-12-16 NOTE — Progress Notes (Signed)
Patient Name: Joseph [name] Date of Encounter: 12/16/2011  Primary Care Provider:  Pola Corn, MD Primary Cardiologist:  Odessa Fleming, MD  Patient Profile  56 y/o male with h/o afib, dx in June 2013, who is s/p dccv and presents today for f/u.  He is back in afib.  Problem List   Past Medical History  Diagnosis Date  . Atrial fibrillation     a. 11/18/2011 s/p DCCV - 150J;  b. Anticoagulation w/ Apixaban;  c. 11/2011 back in afib->asymptomatic.  . H/O alcohol abuse     a. drinks heavily on the weekends.  . Hypertension   . Diabetes mellitus   . Heart murmur     a. 09/2011 Echo: EF 55-60%, Triv AI, Mild MR, mildly dil LA.  . Diabetic peripheral neuropathy   . CKD (chronic kidney disease), stage III    Past Surgical History  Procedure Date  . Eye sx 11/11/2011    left eye  . Cardioversion 11/18/2011    Procedure: CARDIOVERSION;  Surgeon: Wendall Stade, MD;  Location: [facility] ENDOSCOPY;  Service: Cardiovascular;  Laterality: N/A;    Allergies  No Known Allergies  HPI  56 y/o male with the above problem list.  Earlier this summer he was dx with afib, which was asymptomatic.  He was placed on apixaban 5mg  bid and maintained on dutoprol 100 daily.  He subsequently underwent successful DCCV on 7/31.  Since DCCV, he has done well.  He denies chest pain, doe, palpitations, presyncope/syncope, edema, or early satiety.  He continues to drink a six pack of beer on the weekends though says he doesn't drink during the week.  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  He is also back in afib today.  He is asymptomatic.  He reports compliance with his meds.  Home Medications  Prior to Admission medications   Medication Sig Start Date End Date Taking? Authorizing Provider  APIDRA 100 UNIT/ML injection Inject 25 Units into the skin daily.  09/09/11  Yes Historical Provider, MD  apixaban (ELIQUIS) 5 MG TABS tablet Take 1  tablet (5 mg total) by mouth 2 (two) times daily. 12/02/11  Yes Duke Salvia, MD  LANTUS SOLOSTAR 100 UNIT/ML injection Inject 50 Units into the skin. Sliding scale 09/19/11  Yes Historical Provider, MD  LIPITOR 20 MG tablet Take 20 mg by mouth daily.  09/12/11  Yes Historical Provider, MD  lisinopril-hydrochlorothiazide (PRINZIDE,ZESTORETIC) 20-12.5 MG per tablet  09/29/11  Yes Historical Provider, MD  metoprolol (LOPRESSOR) 100 MG tablet Take 1 tablet (100 mg total) by mouth 2 (two) times daily. 12/16/11 12/15/12  Ok Anis, NP    Review of Systems  Pt has been feeling well.  No chest pain, sob, n, v, dizziness, syncope, edema, early satiety, dysuria, dark stools, blood in stools, diarrhea, rash/skin changes, fevers, chills, wt loss/gain.  Otherwise all systems reviewed and negative.  Physical Exam  Blood pressure 184/102, pulse 102, height 6' (1.829 m), weight 229 lb (103.874 kg).  General: Pleasant, NAD Psych: Normal affect. Neuro: Alert and oriented X 3. Moves all extremities spontaneously. HEENT: Normal  Neck: Supple without bruits or JVD. Lungs:  Resp regular and unlabored, CTA. Heart: IR, IR, tachy, no s3, s4, or murmurs. Abdomen: Soft, non-tender, non-distended, BS + x 4.  Extremities: No clubbing, cyanosis or edema. DP/PT/Radials 2+ and equal bilaterally.  Accessory Clinical Findings  ECG -  afib, 102, inferolateral twi (old).  No acute st/t changes.  Assessment & Plan  1.  Atrial Fibrillation:  Pt presents today with recurrent, asymptomatic afib.  Rates are in the low 100's @ rest.  He is anticoagulated on apixaban therapy.  I discussed his case with Dr. Graciela Husbands.  We will d/c dutoprol in favor of metoprolol 100mg  bid (pt was receiving hctz both in dutoprol and lisinopril-hctz) with a goal of improved rate control.  I've counseled him on the importance of etoh cessation.  He has f/u with his PCP next week @ which time hr & bp can be reassessed.  He will f/u with Dr. Graciela Husbands in  4-6 wks.  If ultimately we cannot rate control him, we may consider an antiarrhythmic vs RFCA.  2.  HTN:  BP markedly elevated today.  Titrating bb as above.  He has f/u with pcp next week.  3.  ETOH abuse:  Cessation advised.  4.  DM: per pcp.  5.  CKD III:  Pt plan to see nephrology.  Will check bmet today.  Nicolasa Ducking, NP 12/16/2011, 1:22 PM

## 2011-12-16 NOTE — Patient Instructions (Addendum)
Your physician recommends that you schedule a follow-up appointment in: 6 weeks with Dr Graciela Husbands Your physician recommends that you have lab work drawn today (BMP) Your physician has recommended you make the following change in your medication: STOP Dutoprolol and START Metoprolol Tartrate 100 mg twice daily

## 2011-12-16 NOTE — Telephone Encounter (Signed)
Per Ward Givens, NP - labs are stable; left message on his voice mail if he had questions he could call us.

## 2012-01-22 ENCOUNTER — Telehealth: Payer: Self-pay | Admitting: Internal Medicine

## 2012-01-22 NOTE — Telephone Encounter (Signed)
New Problem:    Called in wanting to receive surgical clearance for eye surgery on 01/27/12.  Please call back.

## 2012-01-22 NOTE — Telephone Encounter (Signed)
Left a message tobcall back.

## 2012-01-25 NOTE — Telephone Encounter (Signed)
Dr Graciela Husbands and corey--this pt needs surgical clearance for eye surgery--please write clearance letter and fax to dr rankin--if pt needs appoint please let us know

## 2012-01-25 NOTE — Telephone Encounter (Signed)
F/U  Office calling regarding cardiac clearance

## 2012-01-25 NOTE — Telephone Encounter (Signed)
Mr. Joseph [name] is, in the absence of symptoms of chest pain or heart failure, acceptable risk to proceeding with his eye surgery. If he is unstable symptoms please have them call our office Jerrell Belfast, MD 01/25/2012 5:33 PM

## 2012-01-26 ENCOUNTER — Telehealth: Payer: Self-pay | Admitting: Internal Medicine

## 2012-01-26 NOTE — Telephone Encounter (Signed)
New problem:  Having surgery on tomorrow. Need to stop eliquis. Office is asking for answer today.

## 2012-01-26 NOTE — Telephone Encounter (Signed)
Per Dr. Graciela Husbands:  Pt is o.k. To hold Eliquis for 24 hours prior to surgery.

## 2012-01-28 ENCOUNTER — Ambulatory Visit (INDEPENDENT_AMBULATORY_CARE_PROVIDER_SITE_OTHER): Payer: Managed Care, Other (non HMO) | Admitting: Internal Medicine

## 2012-01-28 ENCOUNTER — Encounter: Payer: Self-pay | Admitting: Internal Medicine

## 2012-01-28 VITALS — BP 188/113 | HR 66 | Ht 72.0 in | Wt 232.6 lb

## 2012-01-28 DIAGNOSIS — I10 Essential (primary) hypertension: Secondary | ICD-10-CM

## 2012-01-28 DIAGNOSIS — I4891 Unspecified atrial fibrillation: Secondary | ICD-10-CM

## 2012-01-28 MED ORDER — SPIRONOLACTONE 25 MG PO TABS: 12.5000 mg | ORAL_TABLET | Freq: Every day | ORAL | Status: DC

## 2012-01-28 MED ORDER — AMLODIPINE BESYLATE 5 MG PO TABS: 5.0000 mg | ORAL_TABLET | Freq: Every day | ORAL | Status: DC

## 2012-01-28 NOTE — Assessment & Plan Note (Signed)
Refer him to nephrology; with his diabetes and renal insufficiency and hypertension unfortunately dialysis is likely in his future

## 2012-01-28 NOTE — Assessment & Plan Note (Signed)
Poorly controlled hypertension. His creatinine most recently was 2.4 and that this into guidelines acceptable for the use of Aldactone particularly emphasis HF. We will plan to follow his potassium carefully over the next 6 weeks or so. I should note that his potassium was in the low fours. In addition we have added low-dose amlodipine conjunction with his current medical regime.

## 2012-01-28 NOTE — Progress Notes (Signed)
Patient Care Team: Pola Corn, MD as PCP - General (Cardiology)   HPI  Joseph [name] is a 56 y.o. male Seen in followup for atrial fibrillation newly identified in June. We undertook cardioversion following introduction of apixoban; he was seen shortly thereafter with reversion to atrial fibrillation  it was elected at that time to pursue a strategy of rate control. He continues to drink  Echocardiogram demonstrated normal left ventricular function with mild left atrial enlargement.   He is modest renal insufficiency with a creatinine of 2.2; estimated creatinine clearance is 53     He did not end up seeing the renal team. He comes in today without complaints of shortness of breath. He is able to climb 9 flights of stairs at the [place] [employer] last weekend without difficulty. He has not had edema. His blood pressure however is very concerning to  Past Medical History  Diagnosis Date  . Atrial fibrillation     a. 11/18/2011 s/p DCCV - 150J;  b. Anticoagulation w/ Apixaban;  c. 11/2011 back in afib->asymptomatic.  . H/O alcohol abuse     a. drinks heavily on the weekends.  . Hypertension   . Diabetes mellitus   . Heart murmur     a. 09/2011 Echo: EF 55-60%, Triv AI, Mild MR, mildly dil LA.  . Diabetic peripheral neuropathy   . CKD (chronic kidney disease), stage III     Past Surgical History  Procedure Date  . Eye sx 11/11/2011    left eye  . Cardioversion 11/18/2011    Procedure: CARDIOVERSION;  Surgeon: Wendall Stade, MD;  Location: [facility] ENDOSCOPY;  Service: Cardiovascular;  Laterality: N/A;    Current Outpatient Prescriptions  Medication Sig Dispense Refill  . APIDRA 100 UNIT/ML injection Inject 25 Units into the skin daily.       Marland Kitchen apixaban (ELIQUIS) 5 MG TABS tablet Take 1 tablet (5 mg total) by mouth 2 (two) times daily.  180 tablet  2  . LANTUS SOLOSTAR 100 UNIT/ML injection Inject 50 Units into the skin. Sliding scale      . LIPITOR 20 MG tablet Take 20 mg by mouth  daily.       Marland Kitchen lisinopril-hydrochlorothiazide (PRINZIDE,ZESTORETIC) 20-12.5 MG per tablet       . metoprolol (LOPRESSOR) 100 MG tablet Take 1 tablet (100 mg total) by mouth 2 (two) times daily.  180 tablet  3    No Known Allergies  Review of Systems negative except from HPI and PMH  Physical Exam BP 188/113  Pulse 66  Ht 6' (1.829 m)  Wt 232 lb 9.6 oz (105.507 kg)  BMI 31.55 kg/m2 Well developed and well nourished in no acute distress HENT normal E scleral and icterus clear Neck Supple JVP flat; carotids brisk and full Clear to ausculation .irregular rate and rhythm, no murmurs gallops or rub Soft with active bowel sounds No clubbing cyanosis trace Edema Alert and oriented, grossly normal motor and sensory function Skin Warm and Dry    Assessment and  Plan

## 2012-01-28 NOTE — Assessment & Plan Note (Signed)
Atrial fibrillation is persistent. However, rate control seems to be adequate and he is anticoagulated with apixaban

## 2012-01-28 NOTE — Patient Instructions (Addendum)
Start Aldactone 12.5mg  daily.  Start Amlodipine 5mg  daily.  You have been referred to Nephrology for hypertension.  BMET:  October 17, October 31, and November 21.  Your physician recommends that you schedule a follow-up appointment in: 3 months with Dr. Graciela Husbands.

## 2012-02-04 ENCOUNTER — Other Ambulatory Visit (INDEPENDENT_AMBULATORY_CARE_PROVIDER_SITE_OTHER): Payer: Managed Care, Other (non HMO)

## 2012-02-04 DIAGNOSIS — I10 Essential (primary) hypertension: Secondary | ICD-10-CM

## 2012-02-04 LAB — BASIC METABOLIC PANEL
CO2: 29 mEq/L (ref 19–32)
Calcium: 8.7 mg/dL (ref 8.4–10.5)
Creatinine, Ser: 2.3 mg/dL — ABNORMAL HIGH (ref 0.4–1.5)
GFR: 37.35 mL/min — ABNORMAL LOW (ref >60.00)
Sodium: 141 mEq/L (ref 135–145)

## 2012-02-16 ENCOUNTER — Other Ambulatory Visit: Payer: Self-pay | Admitting: Nephrology

## 2012-02-17 NOTE — Progress Notes (Signed)
Notified. 

## 2012-02-18 ENCOUNTER — Other Ambulatory Visit (INDEPENDENT_AMBULATORY_CARE_PROVIDER_SITE_OTHER): Payer: Managed Care, Other (non HMO)

## 2012-02-18 DIAGNOSIS — I10 Essential (primary) hypertension: Secondary | ICD-10-CM

## 2012-02-18 LAB — BASIC METABOLIC PANEL
BUN: 59 mg/dL — ABNORMAL HIGH (ref 6–23)
Creatinine, Ser: 2.6 mg/dL — ABNORMAL HIGH (ref 0.4–1.5)
GFR: 32.33 mL/min — ABNORMAL LOW (ref >60.00)
Potassium: 4.4 mEq/L (ref 3.5–5.1)

## 2012-02-19 ENCOUNTER — Other Ambulatory Visit: Payer: Managed Care, Other (non HMO)

## 2012-02-22 ENCOUNTER — Ambulatory Visit
Admission: RE | Admit: 2012-02-22 | Discharge: 2012-02-22 | Disposition: A | Discharge status: Home / Self Care | Payer: Managed Care, Other (non HMO) | Source: Ambulatory Visit | Attending: Nephrology | Admitting: Nephrology

## 2012-03-08 ENCOUNTER — Encounter (HOSPITAL_COMMUNITY): Payer: Self-pay | Admitting: Emergency Medicine

## 2012-03-08 ENCOUNTER — Emergency Department (HOSPITAL_COMMUNITY)
Admission: EM | Admit: 2012-03-08 | Discharge: 2012-03-08 | Disposition: A | Discharge status: Home / Self Care | Payer: Managed Care, Other (non HMO) | Attending: Emergency Medicine | Admitting: Emergency Medicine

## 2012-03-08 DIAGNOSIS — I129 Hypertensive chronic kidney disease with stage 1 through stage 4 chronic kidney disease, or unspecified chronic kidney disease: Secondary | ICD-10-CM | POA: Insufficient documentation

## 2012-03-08 DIAGNOSIS — Z8679 Personal history of other diseases of the circulatory system: Secondary | ICD-10-CM | POA: Insufficient documentation

## 2012-03-08 DIAGNOSIS — Z79899 Other long term (current) drug therapy: Secondary | ICD-10-CM | POA: Insufficient documentation

## 2012-03-08 DIAGNOSIS — N183 Chronic kidney disease, stage 3 unspecified: Secondary | ICD-10-CM | POA: Insufficient documentation

## 2012-03-08 DIAGNOSIS — F1010 Alcohol abuse, uncomplicated: Secondary | ICD-10-CM | POA: Insufficient documentation

## 2012-03-08 DIAGNOSIS — E1149 Type 2 diabetes mellitus with other diabetic neurological complication: Secondary | ICD-10-CM | POA: Insufficient documentation

## 2012-03-08 DIAGNOSIS — S335XXA Sprain of ligaments of lumbar spine, initial encounter: Secondary | ICD-10-CM | POA: Insufficient documentation

## 2012-03-08 DIAGNOSIS — S139XXA Sprain of joints and ligaments of unspecified parts of neck, initial encounter: Secondary | ICD-10-CM | POA: Insufficient documentation

## 2012-03-08 DIAGNOSIS — Z87891 Personal history of nicotine dependence: Secondary | ICD-10-CM | POA: Insufficient documentation

## 2012-03-08 DIAGNOSIS — Y9389 Activity, other specified: Secondary | ICD-10-CM | POA: Insufficient documentation

## 2012-03-08 DIAGNOSIS — T148XXA Other injury of unspecified body region, initial encounter: Secondary | ICD-10-CM

## 2012-03-08 DIAGNOSIS — G909 Disorder of the autonomic nervous system, unspecified: Secondary | ICD-10-CM | POA: Insufficient documentation

## 2012-03-08 MED ORDER — METHOCARBAMOL 500 MG PO TABS: 1000.0000 mg | ORAL_TABLET | Freq: Four times a day (QID) | ORAL | Status: DC
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
## 2012-03-08 NOTE — ED Provider Notes (Signed)
Medical screening examination/treatment/procedure(s) were performed by non-physician practitioner and as supervising physician I was immediately available for consultation/collaboration.   Gavin Pound. Oletta Lamas, MD 03/08/12 [phone]
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
## 2012-03-10 ENCOUNTER — Other Ambulatory Visit (INDEPENDENT_AMBULATORY_CARE_PROVIDER_SITE_OTHER): Payer: Managed Care, Other (non HMO)

## 2012-03-10 DIAGNOSIS — I10 Essential (primary) hypertension: Secondary | ICD-10-CM

## 2012-03-10 LAB — BASIC METABOLIC PANEL
CO2: 28 mEq/L (ref 19–32)
Calcium: 8.9 mg/dL (ref 8.4–10.5)
GFR: 27.47 mL/min — ABNORMAL LOW (ref >60.00)
Potassium: 4.4 mEq/L (ref 3.5–5.1)
Sodium: 138 mEq/L (ref 135–145)

## 2012-03-14 ENCOUNTER — Other Ambulatory Visit: Payer: Self-pay | Admitting: *Deleted

## 2012-03-14 MED ORDER — LISINOPRIL 20 MG PO TABS: 20.0000 mg | ORAL_TABLET | Freq: Every day | ORAL | Status: DC

## 2012-04-29 ENCOUNTER — Ambulatory Visit: Payer: Managed Care, Other (non HMO) | Admitting: Internal Medicine

## 2012-05-04 ENCOUNTER — Ambulatory Visit: Payer: Managed Care, Other (non HMO) | Admitting: Internal Medicine

## 2012-06-10 ENCOUNTER — Encounter: Payer: Self-pay | Admitting: Internal Medicine

## 2012-06-10 ENCOUNTER — Ambulatory Visit (INDEPENDENT_AMBULATORY_CARE_PROVIDER_SITE_OTHER): Payer: Managed Care, Other (non HMO) | Admitting: Internal Medicine

## 2012-06-10 VITALS — BP 160/100 | HR 88 | Ht 72.0 in | Wt 237.4 lb

## 2012-06-10 NOTE — Patient Instructions (Signed)
Your physician wants you to follow-up in: 1 year with Dr. Klein. You will receive a reminder letter in the mail two months in advance. If you don't receive a letter, please call our office to schedule the follow-up appointment.  Your physician recommends that you continue on your current medications as directed. Please refer to the Current Medication list given to you today.  

## 2012-06-10 NOTE — Progress Notes (Signed)
Patient Care Team: Pola Corn, MD as PCP - General (Cardiology)   HPI  Joseph [name] is a 57 y.o. male Seen in followup for atrial fibrillation newly identified in June. We undertook cardioversion following introduction of apixoban; he was seen shortly thereafter with reversion to atrial fibrillation  it was elected at that time to pursue a strategy of rate control.    Echocardiogram 7/13 demonstrated normal left ventricular function with mild left atrial enlargement.   Renal function January 2014 and creatinine of 2.26 with a potassium of 4.7 he saw Dr. Lowell Guitar at that time. He is to see Dr. Shana Chute tomorrow. He currently has a cold. He is taking decongestants. Past Medical History  Diagnosis Date  . Atrial fibrillation     a. 11/18/2011 s/p DCCV - 150J;  b. Anticoagulation w/ Apixaban;  c. 11/2011 back in afib->asymptomatic.  . H/O alcohol abuse     a. drinks heavily on the weekends.  . Hypertension   . Diabetes mellitus   . Heart murmur     a. 09/2011 Echo: EF 55-60%, Triv AI, Mild MR, mildly dil LA.  . Diabetic peripheral neuropathy   . CKD (chronic kidney disease), stage III     Past Surgical History  Procedure Laterality Date  . Eye sx  11/11/2011    left eye  . Cardioversion  11/18/2011    Procedure: CARDIOVERSION;  Surgeon: Wendall Stade, MD;  Location: [facility] ENDOSCOPY;  Service: Cardiovascular;  Laterality: N/A;    Current Outpatient Prescriptions  Medication Sig Dispense Refill  . amLODipine (NORVASC) 5 MG tablet Take 1 tablet (5 mg total) by mouth daily.  180 tablet  3  . APIDRA 100 UNIT/ML injection Inject 25 Units into the skin daily.       Marland Kitchen apixaban (ELIQUIS) 5 MG TABS tablet Take 1 tablet (5 mg total) by mouth 2 (two) times daily.  180 tablet  2  . LANTUS SOLOSTAR 100 UNIT/ML injection Inject 50 Units into the skin. Sliding scale      . LIPITOR 20 MG tablet Take 20 mg by mouth daily.       Marland Kitchen lisinopril (PRINIVIL,ZESTRIL) 20 MG tablet Take 1 tablet (20 mg total)  by mouth daily.  90 tablet  3  . methocarbamol (ROBAXIN) 500 MG tablet Take 2 tablets (1,000 mg total) by mouth 4 (four) times daily.  20 tablet  0  . metoprolol (LOPRESSOR) 100 MG tablet Take 1 tablet (100 mg total) by mouth 2 (two) times daily.  180 tablet  3  . spironolactone (ALDACTONE) 25 MG tablet Take 0.5 tablets (12.5 mg total) by mouth daily.  90 tablet  3   No current facility-administered medications for this visit.    No Known Allergies  Review of Systems negative except from HPI and PMH  Physical Exam BP 186/111  Pulse 88  Ht 6' (1.829 m)  Wt 237 lb 6.4 oz (107.684 kg)  BMI 32.19 kg/m2 ToWell developed and well nourished in no acute distress HENT normal E scleral and icterus clear Neck Supple JVP flat; carotids brisk and full Clear to ausculation Irregularly irregular rate and rhythm, no murmurs gallops or rub Soft with active bowel sounds No clubbing cyanosis none Edema Alert and oriented, grossly normal motor and sensory function Skin Warm and Dry  ECG demonstrates atrial fibrillation at 86 intervals-/08/38  Assessment and  Plan

## 2012-06-10 NOTE — Assessment & Plan Note (Signed)
Rate control appears adequate. Continue anticoagulation with apixaban

## 2012-06-10 NOTE — Assessment & Plan Note (Signed)
Blood pressure is elevated. He is to see Dr. Shana Chute on Monday. I will have him stop his cold medicines. I will defer blood pressure management is Dr. Shana Chute and Lowell Guitar.

## 2012-06-20 ENCOUNTER — Other Ambulatory Visit: Payer: Self-pay | Admitting: Nephrology

## 2012-06-23 ENCOUNTER — Ambulatory Visit
Admission: RE | Admit: 2012-06-23 | Discharge: 2012-06-23 | Disposition: A | Discharge status: Home / Self Care | Payer: 59 | Source: Ambulatory Visit | Attending: Nephrology | Admitting: Nephrology

## 2012-10-10 ENCOUNTER — Other Ambulatory Visit: Payer: Self-pay | Admitting: Internal Medicine

## 2012-10-10 ENCOUNTER — Other Ambulatory Visit: Payer: Self-pay | Admitting: *Deleted

## 2012-10-20 ENCOUNTER — Encounter: Payer: Self-pay | Admitting: Internal Medicine

## 2012-12-19 ENCOUNTER — Other Ambulatory Visit: Payer: Self-pay | Admitting: Internal Medicine

## 2013-01-18 ENCOUNTER — Other Ambulatory Visit: Payer: Self-pay | Admitting: Nurse Practitioner

## 2013-02-24 ENCOUNTER — Other Ambulatory Visit: Payer: Self-pay

## 2013-02-24 MED ORDER — LISINOPRIL 20 MG PO TABS: 20.0000 mg | ORAL_TABLET | Freq: Every day | ORAL | Status: DC

## 2013-04-27 ENCOUNTER — Other Ambulatory Visit: Payer: Self-pay | Admitting: Internal Medicine

## 2013-05-01 ENCOUNTER — Other Ambulatory Visit: Payer: Self-pay | Admitting: *Deleted

## 2013-05-01 MED ORDER — SPIRONOLACTONE 25 MG PO TABS: 12.5000 mg | ORAL_TABLET | Freq: Every day | ORAL | Status: DC

## 2013-07-04 ENCOUNTER — Ambulatory Visit: Payer: 59 | Admitting: Internal Medicine

## 2013-07-17 ENCOUNTER — Other Ambulatory Visit: Payer: Self-pay | Admitting: Internal Medicine

## 2013-07-20 ENCOUNTER — Ambulatory Visit: Payer: 59 | Admitting: Internal Medicine

## 2013-07-24 ENCOUNTER — Other Ambulatory Visit: Payer: Self-pay | Admitting: Internal Medicine

## 2013-08-15 ENCOUNTER — Ambulatory Visit (INDEPENDENT_AMBULATORY_CARE_PROVIDER_SITE_OTHER): Payer: 59 | Admitting: Internal Medicine

## 2013-08-15 ENCOUNTER — Encounter: Payer: Self-pay | Admitting: Internal Medicine

## 2013-08-15 VITALS — BP 159/92 | HR 72 | Ht 72.0 in | Wt 213.0 lb

## 2013-08-15 DIAGNOSIS — I4891 Unspecified atrial fibrillation: Secondary | ICD-10-CM

## 2013-08-15 NOTE — Progress Notes (Signed)
Patient Care Team: Patricia Nettle, MD as PCP - General (Cardiology)   HPI  Joseph [name] is a 58 y.o. male Seen in followup for atrial fibrillation newly identified in June 2013 We undertook cardioversion following introduction of apixoban; he was seen shortly thereafter with reversion to atrial fibrillation it was elected at that time to pursue a strategy of rate control.   Echocardiogram 7/13 demonstrated normal left ventricular function with mild left atrial enlargement.   Renal function msot recently creatinine of 3.9 with a potassium of 4.7 he saw Dr. Florene Glen at that time.   He has complaints of dyspnea on exertion as well as nocturnal dyspnea and peripheral edema. Recently his Aldactone and ACE inhibitors have been discontinued.  Past Medical History  Diagnosis Date  . Atrial fibrillation     a. 11/18/2011 s/p DCCV - 150J;  b. Anticoagulation w/ Apixaban;  c. 11/2011 back in afib->asymptomatic.  . H/O alcohol abuse     a. drinks heavily on the weekends.  . Hypertension   . Diabetes mellitus   . Heart murmur     a. 09/2011 Echo: EF 55-60%, Triv AI, Mild MR, mildly dil LA.  . Diabetic peripheral neuropathy   . CKD (chronic kidney disease), stage III     Past Surgical History  Procedure Laterality Date  . Eye sx  11/11/2011    left eye  . Cardioversion  11/18/2011    Procedure: CARDIOVERSION;  Surgeon: Josue Hector, MD;  Location: [facility] ENDOSCOPY;  Service: Cardiovascular;  Laterality: N/A;    Current Outpatient Prescriptions  Medication Sig Dispense Refill  . amLODipine (NORVASC) 10 MG tablet TAKE 1 TABLET BY MOUTH EVERY DAY  30 tablet  6  . atorvastatin (LIPITOR) 40 MG tablet Take 40 mg by mouth daily.      . colchicine 0.6 MG tablet 2 (two) times daily.      Marland Kitchen ELIQUIS 5 MG TABS tablet TAKE 1 TABLET BY MOUTH TWICE A DAY  180 tablet  0  . furosemide (LASIX) 80 MG tablet 160 mg 2 (two) times daily.      Marland Kitchen HYDROcodone-acetaminophen (NORCO) 7.5-325 MG per tablet as  needed.      Marland Kitchen ibuprofen (ADVIL,MOTRIN) 600 MG tablet Take 600 mg by mouth every 6 (six) hours as needed.      Marland Kitchen LANTUS SOLOSTAR 100 UNIT/ML injection Inject 50 Units into the skin. Sliding scale      . methocarbamol (ROBAXIN) 500 MG tablet Take 2 tablets (1,000 mg total) by mouth 4 (four) times daily.  20 tablet  0  . metoprolol (LOPRESSOR) 100 MG tablet TAKE 1 TABLET BY MOUTH 2 TIMES DAILY.  180 tablet  0   No current facility-administered medications for this visit.    No Known Allergies  Review of Systems negative except from HPI and PMH  Physical Exam BP 159/92  Pulse 72  Ht 6' (1.829 m)  Wt 213 lb (96.616 kg)  BMI 28.88 kg/m2 Well developed and well nourished in no acute distress HENT normal E scleral and icterus clear Neck Supple JVP 8-10 with positive HJR  carotids brisk and full Clear to ausculation  Regular rate and rhythm, PMI not palpable Soft with active bowel sounds No clubbing cyanosis 2+  Edema Alert and oriented, grossly normal motor and sensory function Skin Warm and Dry  ECG demonstrates atrial flutter with 41 conduction  Assessment and  Plan  Congestive heart failure-acute  Atrial flutter/fibrillation-persistent  Renal insufficiency  He has evidence of heart failure   i spoke with dr Florene Glen who will arrange to see pt tomorrow   He suggested that we could go ahead with diuresis   He will see Dr Florene Glen in am

## 2013-08-15 NOTE — Patient Instructions (Signed)
Your physician recommends that you continue on your current medications as directed. Please refer to the Current Medication list given to you today.  Your physician has requested that you have an echocardiogram. Echocardiography is a painless test that uses sound waves to create images of your heart. It provides your doctor with information about the size and shape of your heart and how well your heart's chambers and valves are working. This procedure takes approximately one hour. There are no restrictions for this procedure.  Follow up to be determined after echo results.

## 2013-08-17 ENCOUNTER — Ambulatory Visit (HOSPITAL_COMMUNITY): Payer: 59 | Attending: Internal Medicine | Admitting: Radiology

## 2013-08-17 ENCOUNTER — Telehealth: Payer: Self-pay | Admitting: Internal Medicine

## 2013-08-17 DIAGNOSIS — I4891 Unspecified atrial fibrillation: Secondary | ICD-10-CM | POA: Insufficient documentation

## 2013-08-17 NOTE — Progress Notes (Signed)
Echocardiogram performed.  

## 2013-08-17 NOTE — Telephone Encounter (Signed)
Spoke with Dr Florene Glen who arrange d followup tomorrow  Spoke with pt this pm and advised an extra 80 mg lasix tonight and in am He voiced understanding

## 2013-08-31 ENCOUNTER — Telehealth: Payer: Self-pay | Admitting: Internal Medicine

## 2013-08-31 NOTE — Telephone Encounter (Signed)
Walk In pt Form " Pt Needs Call back About Echo & when he is to See Dr.Klein" Will give To K Sherri On Friday 5.14.15/kdm

## 2013-09-01 ENCOUNTER — Telehealth: Payer: Self-pay | Admitting: Internal Medicine

## 2013-09-01 NOTE — Telephone Encounter (Signed)
New message     Pt want to talk to [facility]

## 2013-09-01 NOTE — Telephone Encounter (Signed)
A user error has taken place: encounter opened in error, closed for administrative reasons.   (see echo documentation)

## 2013-09-05 ENCOUNTER — Encounter: Payer: Self-pay | Admitting: Internal Medicine

## 2013-09-05 NOTE — Telephone Encounter (Signed)
error 

## 2013-09-05 NOTE — Telephone Encounter (Signed)
This encounter was created in error - please disregard.

## 2013-09-21 ENCOUNTER — Encounter (HOSPITAL_COMMUNITY): Payer: Self-pay | Admitting: *Deleted

## 2013-09-21 ENCOUNTER — Encounter (HOSPITAL_COMMUNITY): Payer: Self-pay

## 2013-09-21 ENCOUNTER — Ambulatory Visit (HOSPITAL_COMMUNITY)
Admission: RE | Admit: 2013-09-21 | Discharge: 2013-09-21 | Disposition: A | Discharge status: Home / Self Care | Payer: 59 | Source: Ambulatory Visit | Attending: Internal Medicine | Admitting: Internal Medicine

## 2013-09-21 VITALS — BP 152/78 | HR 98 | Wt 210.8 lb

## 2013-09-21 DIAGNOSIS — N189 Chronic kidney disease, unspecified: Secondary | ICD-10-CM | POA: Insufficient documentation

## 2013-09-21 DIAGNOSIS — N183 Chronic kidney disease, stage 3 unspecified: Secondary | ICD-10-CM

## 2013-09-21 DIAGNOSIS — I5032 Chronic diastolic (congestive) heart failure: Secondary | ICD-10-CM | POA: Insufficient documentation

## 2013-09-21 DIAGNOSIS — Z87891 Personal history of nicotine dependence: Secondary | ICD-10-CM | POA: Insufficient documentation

## 2013-09-21 DIAGNOSIS — I10 Essential (primary) hypertension: Secondary | ICD-10-CM

## 2013-09-21 DIAGNOSIS — I129 Hypertensive chronic kidney disease with stage 1 through stage 4 chronic kidney disease, or unspecified chronic kidney disease: Secondary | ICD-10-CM | POA: Insufficient documentation

## 2013-09-21 DIAGNOSIS — I509 Heart failure, unspecified: Secondary | ICD-10-CM | POA: Insufficient documentation

## 2013-09-21 DIAGNOSIS — I4891 Unspecified atrial fibrillation: Secondary | ICD-10-CM | POA: Insufficient documentation

## 2013-09-21 DIAGNOSIS — E119 Type 2 diabetes mellitus without complications: Secondary | ICD-10-CM | POA: Insufficient documentation

## 2013-09-21 MED ORDER — HYDRALAZINE HCL 25 MG PO TABS: 25.0000 mg | ORAL_TABLET | Freq: Three times a day (TID) | ORAL | Status: DC

## 2013-09-21 MED ORDER — METOLAZONE 2.5 MG PO TABS: 2.5000 mg | ORAL_TABLET | ORAL | Status: DC

## 2013-09-21 NOTE — Patient Instructions (Signed)
Start Hydralazine 25 mg Three times a day   Start Metolazone 2.5 mg every Monday, take 30 minutes prior to your Furosemide  Please have Dr Abel Presto office send [place] a copy of your labs  Labs in 2 weeks  Your physician recommends that you schedule a follow-up appointment in: 1 month

## 2013-09-22 ENCOUNTER — Other Ambulatory Visit: Payer: Self-pay | Admitting: *Deleted

## 2013-09-22 DIAGNOSIS — I5032 Chronic diastolic (congestive) heart failure: Secondary | ICD-10-CM | POA: Insufficient documentation

## 2013-09-22 NOTE — Addendum Note (Signed)
Encounter addended by: Asencion Gowda, CCT on: 09/22/2013  8:10 AM<BR>     Documentation filed: Charges VN

## 2013-09-22 NOTE — Progress Notes (Signed)
Patient ID: Joseph [name], male   DOB: Jan 16, 1956, 58 y.o.   MRN: [id number] PCP: Dr. Montez Morita Referring MD: Dr. Caryl Comes  58 yo with history of CKD, chronic atrial fibrillation, and chronic diastolic CHF was referred to CHF clinic for evaluation of CHF and elevated PA pressure on echo.  Patient's creatinine is around 3.7 baseline, probably due to HTN and diabetes.  He follows with Dr. Florene Glen.  He has been in atrial fibrillation since 6/13 when he failed a cardioversion and it was decided to leave him in atrial fibrillation.  He has been on Eliquis.  Most recent echo in 4/15 showed EF 76-54% with PA systolic pressure 85 mmHg.  He has had difficulty recently with volume overload.  Several months ago, he had significant orthopnea and was short of breath with short distances.  Lasix was increased to 160 mg bid and he has felt better since then.  He is still short of breath walking up steps or up inclines.  He does well on flat ground now, can walk up to 1/2 mile.  He is able to lie flat without orthopnea.  No PND.  He is able to push mow his yard.  No chest pain.  BP at home continues to run high, SBP 150s most of the time.   ECG: atypical atrial flutter at 77 with inferior and anterolateral T wave inversions  Labs (8/14): K 4.4, creatinine 3.7  PMH: 1. CKD: Followed by Dr. Florene Glen, likely due to HTN and diabetes.  2. Atrial fibrillation: Chronic.  DCCV attempted in 6/13 but reverted to atrial fibrillation.  Rate control since that time.  3. H/o heavy ETOH, now does not drink. 4. Type II diabetes 5. HTN 6. Chronic diastolic CHF with elevated PA pressure: Echo (4/15) with EF 55-60%, mild MR, PA systolic pressure 85 mmHg, RV normal per report.   SH: On disability, prior smoker but quit, prior heavy ETOH but quit, lives in [place].  FH: Mother with valve replacement, sister with ESRD.   ROS: All systems reviewed and negative except as per HPI.   Current Outpatient Prescriptions  Medication Sig Dispense  Refill  . amLODipine (NORVASC) 10 MG tablet TAKE 1 TABLET BY MOUTH EVERY DAY  30 tablet  6  . atorvastatin (LIPITOR) 40 MG tablet Take 40 mg by mouth daily.      . colchicine 0.6 MG tablet 2 (two) times daily.      Marland Kitchen ELIQUIS 5 MG TABS tablet TAKE 1 TABLET BY MOUTH TWICE A DAY  180 tablet  0  . furosemide (LASIX) 80 MG tablet 160 mg 2 (two) times daily.      Marland Kitchen HYDROcodone-acetaminophen (NORCO) 7.5-325 MG per tablet as needed.      Marland Kitchen ibuprofen (ADVIL,MOTRIN) 600 MG tablet Take 600 mg by mouth every 6 (six) hours as needed.      Marland Kitchen LANTUS SOLOSTAR 100 UNIT/ML injection Inject 50 Units into the skin. Sliding scale      . methocarbamol (ROBAXIN) 500 MG tablet Take 2 tablets (1,000 mg total) by mouth 4 (four) times daily.  20 tablet  0  . metoprolol (LOPRESSOR) 100 MG tablet TAKE 1 TABLET BY MOUTH 2 TIMES DAILY.  180 tablet  0  . hydrALAZINE (APRESOLINE) 25 MG tablet Take 1 tablet (25 mg total) by mouth 3 (three) times daily.  90 tablet  3  . metolazone (ZAROXOLYN) 2.5 MG tablet Take 1 tablet (2.5 mg total) by mouth once a week. Take every Monday 30 minutes  prior to Furosemide  5 tablet  3   No current facility-administered medications for this encounter.    BP 152/78  Pulse 98  Wt 210 lb 12.8 oz (95.618 kg)  SpO2 97% General: NAD Neck: JVP 10 cm, no thyromegaly or thyroid nodule.  Lungs: Clear to auscultation bilaterally with normal respiratory effort. CV: Nondisplaced PMI.  Heart irregular S1/S2, no S3/S4, 1/6 SEM RUSB.  1+ ankle edema.  No carotid bruit.  Normal pedal pulses.  Abdomen: Soft, nontender, no hepatosplenomegaly, no distention.  Skin: Intact without lesions or rashes.  Neurologic: Alert and oriented x 3.  Psych: Normal affect. Extremities: No clubbing or cyanosis.  HEENT: Normal.   Assessment/Plan: 1. Atrial fibrillation: Chronic.  Good rate control.  He has been in atrial fibrillation for about 2 years after failing a cardioversion.  I think he would be unlikely to come out  of atrial fibrillation.  Continue rate control/anticoagulation strategy.  - Eliquis 5 mg bid (creatinine > 1.5 but no other criteria to lower the Eliquis dose are present).  - Continue current metoprolol. 2. HTN: BP has been difficult to control in setting of significant CKD and volume retention.  He is volume overloaded, lowering fluid level may help with BP.  I will also add hydralazine 25 mg tid that can be titrated up as needed.  3. CKD: Follows with Dr. Florene Glen.  To get BMET at his office tomorrow, will ask that a copy is sent here.  4. CHF: Chronic diastolic CHF with elevated PA pressure on echo.  PA systolic pressure 85 mmHg on last echo.  This may be due to combination of left heart failure (pulmonary venous hypertension) and perhaps sleep apnea.   - I will arrange right heart cath for measurement of filling pressures and PA pressure.  Should have sleep study if there is a component of pulmonary arterial HTN.   - Patient is significantly volume overloaded.  I will add metolazone 2.5 mg 30 minutes before am Lasix.  This will be taken only on Mondays (once weekly).  He will need a BMET in 2 wks.   Larey Dresser 09/22/2013

## 2013-10-11 ENCOUNTER — Encounter (HOSPITAL_COMMUNITY)
Admission: RE | Disposition: A | Discharge status: Home / Self Care | Payer: Self-pay | Source: Ambulatory Visit | Attending: Cardiology

## 2013-10-11 ENCOUNTER — Telehealth (HOSPITAL_COMMUNITY): Payer: Self-pay | Admitting: Cardiology

## 2013-10-11 ENCOUNTER — Other Ambulatory Visit: Payer: Self-pay | Admitting: Physician Assistant

## 2013-10-11 ENCOUNTER — Ambulatory Visit (HOSPITAL_COMMUNITY)
Admission: RE | Admit: 2013-10-11 | Discharge: 2013-10-11 | Disposition: A | Discharge status: Home / Self Care | Payer: 59 | Source: Ambulatory Visit | Attending: Cardiology | Admitting: Cardiology

## 2013-10-11 DIAGNOSIS — N183 Chronic kidney disease, stage 3 (moderate): Secondary | ICD-10-CM

## 2013-10-11 DIAGNOSIS — Z87891 Personal history of nicotine dependence: Secondary | ICD-10-CM | POA: Insufficient documentation

## 2013-10-11 DIAGNOSIS — G4733 Obstructive sleep apnea (adult) (pediatric): Secondary | ICD-10-CM

## 2013-10-11 DIAGNOSIS — I129 Hypertensive chronic kidney disease with stage 1 through stage 4 chronic kidney disease, or unspecified chronic kidney disease: Secondary | ICD-10-CM | POA: Insufficient documentation

## 2013-10-11 DIAGNOSIS — I509 Heart failure, unspecified: Secondary | ICD-10-CM | POA: Insufficient documentation

## 2013-10-11 DIAGNOSIS — Z7901 Long term (current) use of anticoagulants: Secondary | ICD-10-CM | POA: Insufficient documentation

## 2013-10-11 DIAGNOSIS — I4891 Unspecified atrial fibrillation: Secondary | ICD-10-CM | POA: Insufficient documentation

## 2013-10-11 DIAGNOSIS — I5032 Chronic diastolic (congestive) heart failure: Secondary | ICD-10-CM | POA: Insufficient documentation

## 2013-10-11 DIAGNOSIS — I2789 Other specified pulmonary heart diseases: Secondary | ICD-10-CM | POA: Insufficient documentation

## 2013-10-11 DIAGNOSIS — N189 Chronic kidney disease, unspecified: Secondary | ICD-10-CM | POA: Insufficient documentation

## 2013-10-11 DIAGNOSIS — I279 Pulmonary heart disease, unspecified: Secondary | ICD-10-CM

## 2013-10-11 DIAGNOSIS — E119 Type 2 diabetes mellitus without complications: Secondary | ICD-10-CM | POA: Insufficient documentation

## 2013-10-11 DIAGNOSIS — F1011 Alcohol abuse, in remission: Secondary | ICD-10-CM | POA: Insufficient documentation

## 2013-10-11 HISTORY — PX: RIGHT HEART CATHETERIZATION: SHX5447

## 2013-10-11 LAB — CBC
HCT: 37.2 % — ABNORMAL LOW (ref 39.0–52.0)
HEMOGLOBIN: 11.6 g/dL — AB (ref 13.0–17.0)
MCH: 26.8 pg (ref 26.0–34.0)
MCHC: 31.2 g/dL (ref 30.0–36.0)
MCV: 85.9 fL (ref 78.0–100.0)
PLATELETS: 364 10*3/uL (ref 150–400)
RBC: 4.33 MIL/uL (ref 4.22–5.81)
RDW: 14.2 % (ref 11.5–15.5)
WBC: 8.7 10*3/uL (ref 4.0–10.5)

## 2013-10-11 LAB — BASIC METABOLIC PANEL
BUN: 107 mg/dL — ABNORMAL HIGH (ref 6–23)
CO2: 28 mEq/L (ref 19–32)
Calcium: 9.4 mg/dL (ref 8.4–10.5)
Chloride: 100 mEq/L (ref 96–112)
Creatinine, Ser: 4.73 mg/dL — ABNORMAL HIGH (ref 0.50–1.35)
GFR, EST AFRICAN AMERICAN: 14 mL/min — AB (ref >90)
GFR, EST NON AFRICAN AMERICAN: 12 mL/min — AB (ref >90)
Glucose, Bld: 107 mg/dL — ABNORMAL HIGH (ref 70–99)
Potassium: 3.9 mEq/L (ref 3.7–5.3)
SODIUM: 144 meq/L (ref 137–147)

## 2013-10-11 LAB — POCT I-STAT 3, VENOUS BLOOD GAS (G3P V)
Acid-Base Excess: 3 mmol/L — ABNORMAL HIGH (ref 0.0–2.0)
Bicarbonate: 28.1 mEq/L — ABNORMAL HIGH (ref 20.0–24.0)
O2 SAT: 76 %
PCO2 VEN: 42.1 mmHg — AB (ref 45.0–50.0)
PO2 VEN: 40 mmHg (ref 30.0–45.0)
TCO2: 29 mmol/L (ref 0–100)
pH, Ven: 7.433 — ABNORMAL HIGH (ref 7.250–7.300)

## 2013-10-11 LAB — GLUCOSE, CAPILLARY: Glucose-Capillary: 124 mg/dL — ABNORMAL HIGH (ref 70–99)

## 2013-10-11 LAB — PROTIME-INR
INR: 1.09 (ref 0.00–1.49)
PROTHROMBIN TIME: 13.9 s (ref 11.6–15.2)

## 2013-10-11 SURGERY — RIGHT HEART CATH
Anesthesia: LOCAL

## 2013-10-11 MED ORDER — ACETAMINOPHEN 325 MG PO TABS: 650.0000 mg | ORAL_TABLET | ORAL | Status: DC | PRN

## 2013-10-11 MED ORDER — ASPIRIN 81 MG PO CHEW
CHEWABLE_TABLET | ORAL | Status: AC
  Administered 2013-10-11: 81 mg via ORAL
  Filled 2013-10-11: qty 1

## 2013-10-11 MED ORDER — FENTANYL CITRATE 0.05 MG/ML IJ SOLN
INTRAMUSCULAR | Status: AC
  Filled 2013-10-11: qty 2

## 2013-10-11 MED ORDER — SODIUM CHLORIDE 0.9 % IV SOLN
INTRAVENOUS | Status: DC
  Administered 2013-10-11: 08:00:00 via INTRAVENOUS

## 2013-10-11 MED ORDER — SODIUM CHLORIDE 0.9 % IJ SOLN: 3.0000 mL | Freq: Two times a day (BID) | INTRAMUSCULAR | Status: DC

## 2013-10-11 MED ORDER — LIDOCAINE HCL (PF) 1 % IJ SOLN
INTRAMUSCULAR | Status: AC
  Filled 2013-10-11: qty 30

## 2013-10-11 MED ORDER — ONDANSETRON HCL 4 MG/2ML IJ SOLN: 4.0000 mg | Freq: Four times a day (QID) | INTRAMUSCULAR | Status: DC | PRN

## 2013-10-11 MED ORDER — ASPIRIN 81 MG PO CHEW
81.0000 mg | CHEWABLE_TABLET | ORAL | Status: AC
  Administered 2013-10-11: 81 mg via ORAL

## 2013-10-11 MED ORDER — SODIUM CHLORIDE 0.9 % IV SOLN: 250.0000 mL | INTRAVENOUS | Status: DC | PRN

## 2013-10-11 MED ORDER — MIDAZOLAM HCL 2 MG/2ML IJ SOLN
INTRAMUSCULAR | Status: AC
  Filled 2013-10-11: qty 2

## 2013-10-11 MED ORDER — SODIUM CHLORIDE 0.9 % IJ SOLN: 3.0000 mL | INTRAMUSCULAR | Status: DC | PRN

## 2013-10-11 MED ORDER — HEPARIN (PORCINE) IN NACL 2-0.9 UNIT/ML-% IJ SOLN
INTRAMUSCULAR | Status: AC
  Filled 2013-10-11: qty 500

## 2013-10-11 NOTE — Interval H&P Note (Signed)
History and Physical Interval Note:  10/11/2013 10:36 AM  Joseph [name]  has presented today for surgery, with the diagnosis of heart failure  The various methods of treatment have been discussed with the patient and family. After consideration of risks, benefits and other options for treatment, the patient has consented to  Procedure(s): RIGHT HEART CATH (N/A) as a surgical intervention .  The patient's history has been reviewed, patient examined, no change in status, stable for surgery.  I have reviewed the patient's chart and labs.  Questions were answered to the patient's satisfaction.     Dalton [employer]

## 2013-10-11 NOTE — Telephone Encounter (Signed)
Sleep study/pulm referral placed and appt 11/14/13 @3 :15p with Dr.Alva Pt aware of referral Letter mailed as well

## 2013-10-11 NOTE — H&P (View-Only) (Signed)
Patient ID: Joseph [name], male   DOB: 1956-02-12, 58 y.o.   MRN: [id number] PCP: Dr. Montez Morita Referring MD: Dr. Caryl Comes  58 yo with history of CKD, chronic atrial fibrillation, and chronic diastolic CHF was referred to CHF clinic for evaluation of CHF and elevated PA pressure on echo.  Patient's creatinine is around 3.7 baseline, probably due to HTN and diabetes.  He follows with Dr. Florene Glen.  He has been in atrial fibrillation since 6/13 when he failed a cardioversion and it was decided to leave him in atrial fibrillation.  He has been on Eliquis.  Most recent echo in 4/15 showed EF 61-44% with PA systolic pressure 85 mmHg.  He has had difficulty recently with volume overload.  Several months ago, he had significant orthopnea and was short of breath with short distances.  Lasix was increased to 160 mg bid and he has felt better since then.  He is still short of breath walking up steps or up inclines.  He does well on flat ground now, can walk up to 1/2 mile.  He is able to lie flat without orthopnea.  No PND.  He is able to push mow his yard.  No chest pain.  BP at home continues to run high, SBP 150s most of the time.   ECG: atypical atrial flutter at 77 with inferior and anterolateral T wave inversions  Labs (8/14): K 4.4, creatinine 3.7  PMH: 1. CKD: Followed by Dr. Florene Glen, likely due to HTN and diabetes.  2. Atrial fibrillation: Chronic.  DCCV attempted in 6/13 but reverted to atrial fibrillation.  Rate control since that time.  3. H/o heavy ETOH, now does not drink. 4. Type II diabetes 5. HTN 6. Chronic diastolic CHF with elevated PA pressure: Echo (4/15) with EF 55-60%, mild MR, PA systolic pressure 85 mmHg, RV normal per report.   SH: On disability, prior smoker but quit, prior heavy ETOH but quit, lives in [place].  FH: Mother with valve replacement, sister with ESRD.   ROS: All systems reviewed and negative except as per HPI.   Current Outpatient Prescriptions  Medication Sig Dispense  Refill  . amLODipine (NORVASC) 10 MG tablet TAKE 1 TABLET BY MOUTH EVERY DAY  30 tablet  6  . atorvastatin (LIPITOR) 40 MG tablet Take 40 mg by mouth daily.      . colchicine 0.6 MG tablet 2 (two) times daily.      Marland Kitchen ELIQUIS 5 MG TABS tablet TAKE 1 TABLET BY MOUTH TWICE A DAY  180 tablet  0  . furosemide (LASIX) 80 MG tablet 160 mg 2 (two) times daily.      Marland Kitchen HYDROcodone-acetaminophen (NORCO) 7.5-325 MG per tablet as needed.      Marland Kitchen ibuprofen (ADVIL,MOTRIN) 600 MG tablet Take 600 mg by mouth every 6 (six) hours as needed.      Marland Kitchen LANTUS SOLOSTAR 100 UNIT/ML injection Inject 50 Units into the skin. Sliding scale      . methocarbamol (ROBAXIN) 500 MG tablet Take 2 tablets (1,000 mg total) by mouth 4 (four) times daily.  20 tablet  0  . metoprolol (LOPRESSOR) 100 MG tablet TAKE 1 TABLET BY MOUTH 2 TIMES DAILY.  180 tablet  0  . hydrALAZINE (APRESOLINE) 25 MG tablet Take 1 tablet (25 mg total) by mouth 3 (three) times daily.  90 tablet  3  . metolazone (ZAROXOLYN) 2.5 MG tablet Take 1 tablet (2.5 mg total) by mouth once a week. Take every Monday 30 minutes  prior to Furosemide  5 tablet  3   No current facility-administered medications for this encounter.    BP 152/78  Pulse 98  Wt 210 lb 12.8 oz (95.618 kg)  SpO2 97% General: NAD Neck: JVP 10 cm, no thyromegaly or thyroid nodule.  Lungs: Clear to auscultation bilaterally with normal respiratory effort. CV: Nondisplaced PMI.  Heart irregular S1/S2, no S3/S4, 1/6 SEM RUSB.  1+ ankle edema.  No carotid bruit.  Normal pedal pulses.  Abdomen: Soft, nontender, no hepatosplenomegaly, no distention.  Skin: Intact without lesions or rashes.  Neurologic: Alert and oriented x 3.  Psych: Normal affect. Extremities: No clubbing or cyanosis.  HEENT: Normal.   Assessment/Plan: 1. Atrial fibrillation: Chronic.  Good rate control.  He has been in atrial fibrillation for about 2 years after failing a cardioversion.  I think he would be unlikely to come out  of atrial fibrillation.  Continue rate control/anticoagulation strategy.  - Eliquis 5 mg bid (creatinine > 1.5 but no other criteria to lower the Eliquis dose are present).  - Continue current metoprolol. 2. HTN: BP has been difficult to control in setting of significant CKD and volume retention.  He is volume overloaded, lowering fluid level may help with BP.  I will also add hydralazine 25 mg tid that can be titrated up as needed.  3. CKD: Follows with Dr. Florene Glen.  To get BMET at his office tomorrow, will ask that a copy is sent here.  4. CHF: Chronic diastolic CHF with elevated PA pressure on echo.  PA systolic pressure 85 mmHg on last echo.  This may be due to combination of left heart failure (pulmonary venous hypertension) and perhaps sleep apnea.   - I will arrange right heart cath for measurement of filling pressures and PA pressure.  Should have sleep study if there is a component of pulmonary arterial HTN.   - Patient is significantly volume overloaded.  I will add metolazone 2.5 mg 30 minutes before am Lasix.  This will be taken only on Mondays (once weekly).  He will need a BMET in 2 wks.   Larey Dresser 09/22/2013

## 2013-10-11 NOTE — Discharge Instructions (Signed)
Wound Care Wound care helps prevent pain and infection.   HOME CARE   Only take medicine as told by your doctor.  Clean the wound daily with mild soap and water.  Keep all doctor visits as told. GET HELP RIGHT AWAY IF:   Yellowish-white fluid (pus) comes from the wound.  There is a red streak going away from the wound.  You have a fever. MAKE SURE YOU:   Understand these instructions.  Will watch your condition.  Will get help right away if you are not doing well or get worse. Document Released: 01/14/2008 Document Revised: 06/29/2011 Document Reviewed: 08/10/2010 [facility] Patient Information 2015 [place], [place]. This information is not intended to replace advice given to you by your health care provider. Make sure you discuss any questions you have with your health care provider.

## 2013-10-11 NOTE — CV Procedure (Signed)
    Cardiac Catheterization Procedure Note  Name: Joseph [name] MRN: [id number] DOB: [birth year]  Procedure: Right Heart Cath  Indication: Pulmonary hypertension by echo, CHF   Procedural Details: A peripheral IV was present in the right brachial area.  This was changed out for a 8F venous sheath. A Swan-Ganz catheter was used for the right heart catheterization. Standard protocol was followed for recording of right heart pressures and sampling of oxygen saturations. Fick cardiac output was calculated. There were no immediate procedural complications. The patient was transferred to the post catheterization recovery area for further monitoring.  Procedural Findings: Hemodynamics (mmHg) RA mean 12 RV 63/11 PA 64/28, mean 44 PCWP mean 23, prominent V-waves  Oxygen saturations: PA 76% AO 100%  Cardiac Output (Fick) 7.6  Cardiac Index (Fick) 3.5  PVR 2.8 WU  Final Conclusions:  Mild-moderate elevation left and right heart filling pressures.  There is moderate pulmonary hypertension but PVR 2.8 WU, suggesting that this may be primarily pulmonary venous hypertension.  Would also consider contribution from OSA.   Recommendations: Will arrange sleep study to look for OSA. Creatinine up from 3.7 to 4.7.  Therefore, I am going to have him stop metolazone for now, repeat BMET in 1 week.  It is going to be difficult to balance treatment of diastolic CHF/volume overload with minimizing renal dysfunction.  I will have him restart Eliquis tonight.   [name] 10/11/2013, 11:13 AM

## 2013-10-11 NOTE — Telephone Encounter (Signed)
Message copied by JEFFRIES, Sharlot Gowda on Wed Oct 11, 2013  2:29 PM ------      Message from: Larey Dresser      Created: Wed Oct 11, 2013 11:22 AM       Mr Lamoureaux needs to get a repeat BMET in 1 week (he is to stop metolazone).  He will followup in CHF clinic as scheduled (should be couple weeks).  He needs to have a sleep study arranged.        Thanks.  ------

## 2013-10-17 ENCOUNTER — Other Ambulatory Visit: Payer: 59

## 2013-10-17 DIAGNOSIS — N183 Chronic kidney disease, stage 3 (moderate): Secondary | ICD-10-CM

## 2013-10-17 LAB — BASIC METABOLIC PANEL
BUN: 109 mg/dL — AB (ref 6–23)
CHLORIDE: 99 meq/L (ref 96–112)
CO2: 23 mEq/L (ref 19–32)
Calcium: 8.8 mg/dL (ref 8.4–10.5)
Creat: 4.88 mg/dL — ABNORMAL HIGH (ref 0.50–1.35)
GLUCOSE: 223 mg/dL — AB (ref 70–99)
Potassium: 4.5 mEq/L (ref 3.5–5.3)
Sodium: 132 mEq/L — ABNORMAL LOW (ref 135–145)

## 2013-10-19 ENCOUNTER — Other Ambulatory Visit: Payer: Self-pay | Admitting: Internal Medicine

## 2013-10-30 ENCOUNTER — Ambulatory Visit (HOSPITAL_COMMUNITY)
Admission: RE | Admit: 2013-10-30 | Discharge: 2013-10-30 | Disposition: A | Discharge status: Home / Self Care | Payer: 59 | Source: Ambulatory Visit | Attending: Internal Medicine | Admitting: Internal Medicine

## 2013-10-30 VITALS — BP 136/78 | HR 70 | Wt 216.2 lb

## 2013-10-30 DIAGNOSIS — I4891 Unspecified atrial fibrillation: Secondary | ICD-10-CM

## 2013-10-30 DIAGNOSIS — N183 Chronic kidney disease, stage 3 unspecified: Secondary | ICD-10-CM | POA: Insufficient documentation

## 2013-10-30 DIAGNOSIS — I129 Hypertensive chronic kidney disease with stage 1 through stage 4 chronic kidney disease, or unspecified chronic kidney disease: Secondary | ICD-10-CM | POA: Insufficient documentation

## 2013-10-30 DIAGNOSIS — I509 Heart failure, unspecified: Secondary | ICD-10-CM | POA: Insufficient documentation

## 2013-10-30 DIAGNOSIS — Z87891 Personal history of nicotine dependence: Secondary | ICD-10-CM | POA: Insufficient documentation

## 2013-10-30 DIAGNOSIS — G4733 Obstructive sleep apnea (adult) (pediatric): Secondary | ICD-10-CM

## 2013-10-30 DIAGNOSIS — I5032 Chronic diastolic (congestive) heart failure: Secondary | ICD-10-CM | POA: Insufficient documentation

## 2013-10-30 DIAGNOSIS — Z794 Long term (current) use of insulin: Secondary | ICD-10-CM | POA: Insufficient documentation

## 2013-10-30 DIAGNOSIS — E119 Type 2 diabetes mellitus without complications: Secondary | ICD-10-CM | POA: Insufficient documentation

## 2013-10-30 DIAGNOSIS — Z7901 Long term (current) use of anticoagulants: Secondary | ICD-10-CM | POA: Insufficient documentation

## 2013-10-30 LAB — BASIC METABOLIC PANEL
Anion gap: 16 — ABNORMAL HIGH (ref 5–15)
BUN: 72 mg/dL — AB (ref 6–23)
CALCIUM: 8.6 mg/dL (ref 8.4–10.5)
CO2: 22 mEq/L (ref 19–32)
Chloride: 105 mEq/L (ref 96–112)
Creatinine, Ser: 4.25 mg/dL — ABNORMAL HIGH (ref 0.50–1.35)
GFR calc Af Amer: 16 mL/min — ABNORMAL LOW (ref >90)
GFR, EST NON AFRICAN AMERICAN: 14 mL/min — AB (ref >90)
GLUCOSE: 148 mg/dL — AB (ref 70–99)
Potassium: 4.1 mEq/L (ref 3.7–5.3)
SODIUM: 143 meq/L (ref 137–147)

## 2013-10-30 MED ORDER — METOLAZONE 2.5 MG PO TABS: 2.5000 mg | ORAL_TABLET | ORAL | Status: DC

## 2013-10-30 NOTE — Progress Notes (Signed)
Patient ID: Joseph [name], male   DOB: [birth year], 58 y.o.   MRN: [id number] PCP: Dr. Montez Morita Referring MD: Dr. Caryl Comes  58 yo with history of CKD, chronic atrial fibrillation, and chronic diastolic CHF was referred to CHF clinic for evaluation of CHF and elevated PA pressure on echo.  Patient's creatinine has been as high as 4.9, probably due to HTN and diabetes.  He follows with Dr. Florene Glen.  He has been in atrial fibrillation since 6/13 when he failed a cardioversion and it was decided to leave him in atrial fibrillation.  He has been on Eliquis.  Most recent echo in 4/15 showed EF 14-43% with PA systolic pressure 85 mmHg.  He has had difficulty recently with volume overload.    At last appointment, he looked volume overloaded so I started metolazone once a week.  His creatinine rose to 4.88 so I had him stop it.  He went for RHC in 6/15 showing elevated right and left heart filling pressures with moderate pulmonary hypertension, primarily pulmonary venous hypertension.  Since last appointment, weight is up 6 lbs.  He says that he has been eating a lot of fried fish and fried chicken.  Breathing has been worse.  He is short of breath with moderate activity such as walking across a store or pushing a lawnmower.  His abdomen has become more distended.  Oxygen level was low today, 93% at rest and 83% with exertion.   ECG: atrial flutter with inferior and anterolateral T wave inversions  Labs (8/14): K 4.4, creatinine 3.7 Labs (6/15): K 4.5, creatinine 4.88  PMH: 1. CKD: Followed by Dr. Florene Glen, likely due to HTN and diabetes.  2. Atrial fibrillation: Chronic.  DCCV attempted in 6/13 but reverted to atrial fibrillation.  Rate control since that time.  3. H/o heavy ETOH, now does not drink. 4. Type II diabetes 5. HTN 6. Chronic diastolic CHF with elevated PA pressure: Echo (4/15) with EF 55-60%, mild MR, PA systolic pressure 85 mmHg, RV normal per report. RHC (6/15) with mean RA 12, PA 68/28 mean 44, mean  PCWP 23 with prominent v-waves, CI 3.5, PVR 2.8 WU.   SH: On disability, prior smoker but quit, prior heavy ETOH but quit, lives in [place].  FH: Mother with valve replacement, sister with ESRD.   ROS: All systems reviewed and negative except as per HPI.   Current Outpatient Prescriptions  Medication Sig Dispense Refill  . amLODipine (NORVASC) 10 MG tablet Take 10 mg by mouth 2 (two) times daily.      Marland Kitchen apixaban (ELIQUIS) 5 MG TABS tablet Take 5 mg by mouth 2 (two) times daily.      Marland Kitchen atorvastatin (LIPITOR) 40 MG tablet Take 40 mg by mouth daily.      . colchicine 0.6 MG tablet Take 0.6 mg by mouth 2 (two) times daily.       Marland Kitchen ELIQUIS 5 MG TABS tablet TAKE 1 TABLET BY MOUTH TWICE A DAY  180 tablet  0  . furosemide (LASIX) 80 MG tablet Take 160 mg by mouth 2 (two) times daily.       . hydrALAZINE (APRESOLINE) 25 MG tablet Take 25 mg by mouth 3 (three) times daily.      Marland Kitchen HYDROcodone-acetaminophen (NORCO) 7.5-325 MG per tablet Take 0.5 tablets by mouth daily as needed for moderate pain.       Marland Kitchen insulin glulisine (APIDRA) 100 UNIT/ML injection Inject 25 Units into the skin daily.      Marland Kitchen  LANTUS SOLOSTAR 100 UNIT/ML injection Inject 50 Units into the skin daily. Sliding scale      . metoprolol (LOPRESSOR) 100 MG tablet TAKE 1 TABLET BY MOUTH 2 TIMES DAILY.  180 tablet  0  . Multiple Vitamin (MULTIVITAMIN) tablet Take 1 tablet by mouth daily.      Marland Kitchen OVER THE COUNTER MEDICATION Take 1 tablet by mouth daily. For joints      . metolazone (ZAROXOLYN) 2.5 MG tablet Take 1 tablet (2.5 mg total) by mouth once a week. Every Tuesday  5 tablet  3   No current facility-administered medications for this encounter.    BP 136/78  Pulse 70  Wt 216 lb 4 oz (98.09 kg)  SpO2 98% General: NAD Neck: JVP 14 cm with HJR, no thyromegaly or thyroid nodule.  Lungs: Clear to auscultation bilaterally with normal respiratory effort. CV: Nondisplaced PMI.  Heart irregular S1/S2, no S3/S4, 1/6 SEM RUSB.  1+ edema  1/3 up lower legs bilaterally.  No carotid bruit.  Normal pedal pulses.  Abdomen: Soft, nontender, no hepatosplenomegaly, mild distention.  Skin: Intact without lesions or rashes.  Neurologic: Alert and oriented x 3.  Psych: Normal affect. Extremities: No clubbing or cyanosis.   Assessment/Plan: 1. Atrial fibrillation: Chronic.  Good rate control.  He has been in atrial fibrillation for about 2 years after failing a cardioversion.  I think he would be unlikely to come out of atrial fibrillation.  Continue rate control/anticoagulation strategy.  - Eliquis 5 mg bid (creatinine > 1.5 but no other criteria to lower the Eliquis dose are present).  - Continue current metoprolol. 2. HTN: BP is under better control after starting hydralazine.   3. CKD: Creatinine has been up to 4.88.  He remains volume overloaded on exam today with NYHA class III symptoms.  I am not sure that we are going to be able to effectively remove fluid to decongest him without HD. I am going to have him go back to once weekly metolazone and will follow creatinine closely, with BMET today and in 2 wks.  He will need close followup with Dr. Florene Glen.  4. CHF: Chronic diastolic CHF with elevated PA pressure on echo.  RHC demonstrated elevated right and left heart filling pressures and moderate pulmonary hypertension.  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  As above, he remains volume overloaded with NYHA class III symptoms.  He is hypoxic with activity.  - Restart metolazone 2.5 mg once weekly 30 minutes before am Lasix.  BMET today and in 2 wks.  - As above, suspect he will ultimately need HD for fluid removal.   - I will start home oxygen for use with activity.  - I will arrange for sleep study.   Followup in 1 month.   [name] 10/30/2013

## 2013-10-30 NOTE — Progress Notes (Signed)
Walking into clinic pt was very dyspneic, O2 sat was 78%, pt was placed on 2L O2 via [place] and sats improved to 98%

## 2013-10-30 NOTE — Progress Notes (Signed)
SATURATION QUALIFICATIONS: (This note is used to comply with regulatory documentation for home oxygen)  Patient Saturations on Room Air at Rest = 93%  Patient Saturations on Room Air while Ambulating = 83%  Patient Saturations on 2 Liters of oxygen while Ambulating = 94%

## 2013-10-30 NOTE — Patient Instructions (Addendum)
Lab today and in 2 weeks  Start Metolazone 2.5 mg every Tuesday  Your physician recommends that you schedule a follow-up appointment in: 1 month  You have been referred to [facility] for home oxygen

## 2013-10-31 NOTE — Addendum Note (Signed)
Encounter addended by: Evalee Mutton, CCT on: 10/31/2013 10:02 AM<BR>     Documentation filed: Charges VN

## 2013-11-08 ENCOUNTER — Telehealth (HOSPITAL_COMMUNITY): Payer: Self-pay | Admitting: Vascular Surgery

## 2013-11-08 NOTE — Telephone Encounter (Signed)
Please call pt about medication Metolazone.. Missed it on Tuesday .Marland Kitchen Pt wants to know if he can still take the medication Today

## 2013-11-08 NOTE — Telephone Encounter (Signed)
Spoke w/pt advised ok to go ahead and take today then next week resume taking every Tuesday he is agreeable

## 2013-11-14 ENCOUNTER — Encounter (INDEPENDENT_AMBULATORY_CARE_PROVIDER_SITE_OTHER): Payer: Self-pay

## 2013-11-14 ENCOUNTER — Encounter: Payer: Self-pay | Admitting: Pulmonary Disease

## 2013-11-14 ENCOUNTER — Ambulatory Visit (INDEPENDENT_AMBULATORY_CARE_PROVIDER_SITE_OTHER): Payer: 59 | Admitting: Pulmonary Disease

## 2013-11-14 VITALS — BP 140/78 | HR 72 | Temp 98.0°F | Ht 72.0 in | Wt 206.1 lb

## 2013-11-14 DIAGNOSIS — G4733 Obstructive sleep apnea (adult) (pediatric): Secondary | ICD-10-CM

## 2013-11-14 NOTE — Progress Notes (Signed)
Subjective:    Patient ID: Joseph [name], male    DOB: 05-Feb-1956, 58 y.o..   MRN: [id number]  HPI 58 year old African American man with CKD, chronic atrial fibrillation, and chronic diastolic CHF referred for evaluation of Sleep disordered breathing His  creatinine has been as high as 4.9 ( Dr. Florene Glen.)  He is maintained on eliquis for atrial fibrillation. He has NYHA class III symptoms. He is hypoxic with activity- 2 wks ago -started on O2  echo 07/2013 showed EF 36-14% with PA systolic pressure 85 mmHg.  Epworth sleepiness score is 6/24 Bedtime is around 11 PM, he reports that his sleep habits are variable and he sometimes wakes up around 2:30 AM. He sleeps on his side with 2 pillows, reports 4-5 nocturnal awakenings due to nocturia and is out of bed by 7 AM feeling tired with occasional dryness of mouth, denies headaches. He underwent a home study in 10/2011 with night watch. This showed AHI of 5.6, 20.6 and 4.3 on 3 consecutive nights. This was reported as positive for mild OSA. His overall status has worsened over the last 2 years and he has had multiple admissions for volume overload.  His wife has noted loud snoring, he denies witnessed apneas. There is no history suggestive of cataplexy, sleep paralysis or parasomnias   Past Medical History  Diagnosis Date  . Atrial fibrillation     a. 11/18/2011 s/p DCCV - 150J;  b. Anticoagulation w/ Apixaban;  c. 11/2011 back in afib->asymptomatic.  . H/O alcohol abuse     a. drinks heavily on the weekends.  . Hypertension   . Diabetes mellitus   . Heart murmur     a. 09/2011 Echo: EF 55-60%, Triv AI, Mild MR, mildly dil LA.  . Diabetic peripheral neuropathy   . CKD (chronic kidney disease), stage III     Past Surgical History  Procedure Laterality Date  . Eye sx  11/11/2011    left eye  . Cardioversion  11/18/2011    Procedure: CARDIOVERSION;  Surgeon: Josue Hector, MD;  Location: [facility] ENDOSCOPY;  Service: Cardiovascular;  Laterality:  N/A;    No Known Allergies  History   Social History  . Marital Status: Married    Spouse Name: N/A    Number of Children: N/A  . Years of Education: N/A   Occupational History  . Not on file.   Social History Main Topics  . Smoking status: Former Smoker -- 0.01 packs/day for 5 years    Types: Cigarettes    Quit date: 04/20/1994  . Smokeless tobacco: Not on file     Comment: some in college  . Alcohol Use: Yes     Comment: occasionally beer  . Drug Use: No  . Sexual Activity: Not on file   Other Topics Concern  . Not on file   Social History Narrative  . No narrative on file    Family History  Problem Relation Age of Onset  . Heart disease Mother        Review of Systems  Constitutional: Negative for fever and unexpected weight change.  HENT: Negative for congestion, dental problem, ear pain, nosebleeds, postnasal drip, rhinorrhea, sinus pressure, sneezing, sore throat and trouble swallowing.   Eyes: Negative for redness and itching.  Respiratory: Positive for shortness of breath. Negative for cough, chest tightness and wheezing.   Cardiovascular: Positive for palpitations. Negative for leg swelling.  Gastrointestinal: Negative for nausea and vomiting.  Genitourinary: Negative for dysuria.  Musculoskeletal:  Negative for joint swelling.  Skin: Negative for rash.  Neurological: Negative for headaches.  Hematological: Does not bruise/bleed easily.  Psychiatric/Behavioral: Negative for dysphoric mood. The patient is not nervous/anxious.        Objective:   Physical Exam  Gen. Pleasant,  in no distress, normal affect ENT - no lesions, no post nasal drip, class 2-3 airway Neck: No JVD, no thyromegaly, no carotid bruits Lungs: no use of accessory muscles, no dullness to percussion, decreased without rales or rhonchi  Cardiovascular: Rhythm regular, heart sounds  normal, no murmurs or gallops, no peripheral edema Abdomen: soft and non-tender, no  hepatosplenomegaly, BS normal. Musculoskeletal: No deformities, no cyanosis or clubbing Neuro:  alert, non focal, no tremors       Assessment & Plan:

## 2013-11-14 NOTE — Patient Instructions (Signed)
We will set you up for a sleep study

## 2013-11-14 NOTE — Assessment & Plan Note (Addendum)
Given excessive daytime somnolence, narrow pharyngeal exam, witnessed apneas & loud snoring, obstructive sleep apnea is very likely & an overnight polysomnogram will be scheduled as a split study. The pathophysiology of obstructive sleep apnea , it's cardiovascular consequences & modes of treatment including CPAP were discused with the patient in detail & they evidenced understanding.  Given his comorbidities and the variability noted on his prior home studies, would suggest that we proceed with an attended polysomnogram

## 2013-11-30 ENCOUNTER — Encounter (HOSPITAL_COMMUNITY): Payer: 59

## 2013-12-28 ENCOUNTER — Telehealth: Payer: Self-pay | Admitting: Internal Medicine

## 2013-12-28 NOTE — Telephone Encounter (Signed)
Spoke w/pt he is upset that we have never called him regarding lab work, advised pt he missed appt with [place] on 8/13 he states he had forgot what day appt was and that his nephrologist told him to stop the metolazone, attempted to sch pt an appt with Dr Aundra Dubin however he states he is busy right now and can not sch appt to call him back tomorrow, advised I would send mess to [facility] and she will call him back to resch to remain off metolazone for now if nephrologist had advised him to do so, he is agreeable

## 2013-12-28 NOTE — Telephone Encounter (Signed)
Patient tells me that his urologist wants him to discuss stopping Metalaxone with his cardiologist -- he states that Dr. Aundra Dubin started him on this medication. I explained that I would forward this to his nurse in the heart failure clinic to address with him.

## 2013-12-28 NOTE — Telephone Encounter (Signed)
New message     Talk to a nurse.  Pt saw his urologist and he said he need to stop one of his medications.  Please call

## 2014-01-01 ENCOUNTER — Other Ambulatory Visit: Payer: Self-pay

## 2014-01-01 DIAGNOSIS — N184 Chronic kidney disease, stage 4 (severe): Secondary | ICD-10-CM

## 2014-01-01 DIAGNOSIS — Z01810 Encounter for preprocedural cardiovascular examination: Secondary | ICD-10-CM

## 2014-01-03 NOTE — Progress Notes (Signed)
Patient ID: Joseph [name], male   DOB: 09/23/1955, 58 y.o.   MRN: [id number]   PCP: Dr. Montez Morita Referring MD: Dr. Caryl Comes Nephrology: Dr Florene Glen   58 yo with history of CKD, chronic atrial fibrillation, and chronic diastolic CHF was referred to CHF clinic for evaluation of CHF and elevated PA pressure on echo.  Patient's creatinine has been as high as 4.9, probably due to HTN and diabetes.  He follows with Dr. Florene Glen.  He has been in atrial fibrillation since 6/13 when he failed a cardioversion and it was decided to leave him in atrial fibrillation.  He has been on Eliquis.  Most recent echo in 4/15 showed EF 40-98% with PA systolic pressure 85 mmHg.  He has had difficulty recently with volume overload.     RHC in 6/15 showing elevated right and left heart filling pressures with moderate pulmonary hypertension, primarily pulmonary venous hypertension.    He returns for follow up. Last visit Metolazone was started weekly and he was started on oxygen due to hypoxia.  Says he uses oxygen as needed at night. Denies SOB/PND/Orthopnea. Mild dyspnea with steps. Since the last visit Dr Florene Glen stopped his metolazone due to elevated renal function. Marland KitchenHas follow with vascular surgeon for fistula. Weight at home  208-210 pounds.Has sleep study next week.    Labs (8/14): K 4.4, creatinine 3.7 Labs (6/15): K 4.5, creatinine 4.88 Labs (10/30/13) : 4.1 Creatinine 4.22  PMH: 1. CKD: Followed by Dr. Florene Glen, likely due to HTN and diabetes.  2. Atrial fibrillation: Chronic.  DCCV attempted in 6/13 but reverted to atrial fibrillation.  Rate control since that time.  3. H/o heavy ETOH, now does not drink. 4. Type II diabetes 5. HTN 6. Chronic diastolic CHF with elevated PA pressure: Echo (4/15) with EF 55-60%, mild MR, PA systolic pressure 85 mmHg, RV normal per report. RHC (6/15) with mean RA 12, PA 68/28 mean 44, mean PCWP 23 with prominent v-waves, CI 3.5, PVR 2.8 WU.   SH: On disability, prior smoker but quit, prior  heavy ETOH but quit, lives in [place].  FH: Mother with valve replacement, sister with ESRD.   ROS: All systems reviewed and negative except as per HPI.   Current Outpatient Prescriptions  Medication Sig Dispense Refill  . amLODipine (NORVASC) 10 MG tablet Take 10 mg by mouth 2 (two) times daily.      Marland Kitchen atorvastatin (LIPITOR) 40 MG tablet Take 40 mg by mouth daily.      . colchicine 0.6 MG tablet Take 0.6 mg by mouth 2 (two) times daily.       Marland Kitchen ELIQUIS 5 MG TABS tablet TAKE 1 TABLET BY MOUTH TWICE A DAY  180 tablet  0  . furosemide (LASIX) 80 MG tablet Take 160 mg by mouth 2 (two) times daily.       . hydrALAZINE (APRESOLINE) 25 MG tablet Take 25 mg by mouth 3 (three) times daily.      Marland Kitchen HYDROcodone-acetaminophen (NORCO) 7.5-325 MG per tablet Take 0.5 tablets by mouth daily as needed for moderate pain.       Marland Kitchen insulin glulisine (APIDRA) 100 UNIT/ML injection Inject 25 Units into the skin daily.      Marland Kitchen LANTUS SOLOSTAR 100 UNIT/ML injection Inject 50 Units into the skin daily. Sliding scale      . metoprolol (LOPRESSOR) 100 MG tablet TAKE 1 TABLET BY MOUTH 2 TIMES DAILY.  180 tablet  0  . Multiple Vitamin (MULTIVITAMIN) tablet Take 1 tablet  by mouth daily.      Marland Kitchen OVER THE COUNTER MEDICATION Take 1 tablet by mouth daily. For joints       No current facility-administered medications for this encounter.    BP 152/92  Pulse 92  Resp 18  Wt 210 lb 6 oz (95.425 kg)  SpO2 95% General: NAD Neck: JVP 5-6 cm with HJR, no thyromegaly or thyroid nodule.  Lungs: Clear to auscultation bilaterally with normal respiratory effort. CV: Nondisplaced PMI.  Heart irregular S1/S2, no S3/S4, 1/6 SEM RUSB.  No edema.  No carotid bruit.  Normal pedal pulses.  Abdomen: Soft, nontender, no hepatosplenomegaly, Non distended.  Skin: Intact without lesions or rashes.  Neurologic: Alert and oriented x 3.  Psych: Normal affect. Extremities: No clubbing or cyanosis.   Assessment/Plan: 1. Atrial  fibrillation: Chronic.  Has been in atrial fibrillation for about 58 years after failing a cardioversion.  Continue rate control/anticoagulation strategy.  - Eliquis 5 mg bid (creatinine > 1.5 but no other criteria to lower the Eliquis dose are present).  - Continue current metoprolol 100 mg twice a day.  2. HTN: BP elevated. Increase hydralazine 37.5 mg three times a day.    3. CKD: Followed closely  by Dr Florene Glen.   4. CHF: Chronic diastolic CHF with elevated PA pressure on echo.  RHC demonstrated elevated right and left heart filling pressures and moderate pulmonary hypertension.  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   NYHA II-III. Volume status stable. Continue lasix 160 mg twice a day. Metolazone discontinued by Dr Florene Glen. So far he is doing ok off it.  - Will likely need HD for fluid removal.  Has appointment with vascular Surgeon for fistula.  -On home oxygen .  -Sleep study scheduled next week.    Follow up in 6 weeks with Dr Aundra Dubin.  Rebbeca Sheperd NP-C  01/04/2014

## 2014-01-04 ENCOUNTER — Ambulatory Visit (HOSPITAL_COMMUNITY)
Admission: RE | Admit: 2014-01-04 | Discharge: 2014-01-04 | Disposition: A | Discharge status: Home / Self Care | Payer: 59 | Source: Ambulatory Visit | Attending: Internal Medicine | Admitting: Internal Medicine

## 2014-01-04 ENCOUNTER — Encounter (HOSPITAL_COMMUNITY): Payer: Self-pay

## 2014-01-04 VITALS — BP 152/92 | HR 92 | Resp 18 | Wt 210.4 lb

## 2014-01-04 DIAGNOSIS — I4891 Unspecified atrial fibrillation: Secondary | ICD-10-CM | POA: Diagnosis not present

## 2014-01-04 DIAGNOSIS — E785 Hyperlipidemia, unspecified: Secondary | ICD-10-CM | POA: Insufficient documentation

## 2014-01-04 DIAGNOSIS — F1011 Alcohol abuse, in remission: Secondary | ICD-10-CM | POA: Diagnosis not present

## 2014-01-04 DIAGNOSIS — E119 Type 2 diabetes mellitus without complications: Secondary | ICD-10-CM | POA: Diagnosis not present

## 2014-01-04 DIAGNOSIS — N184 Chronic kidney disease, stage 4 (severe): Secondary | ICD-10-CM | POA: Insufficient documentation

## 2014-01-04 DIAGNOSIS — I10 Essential (primary) hypertension: Secondary | ICD-10-CM

## 2014-01-04 DIAGNOSIS — G4733 Obstructive sleep apnea (adult) (pediatric): Secondary | ICD-10-CM | POA: Diagnosis not present

## 2014-01-04 DIAGNOSIS — N183 Chronic kidney disease, stage 3 unspecified: Secondary | ICD-10-CM

## 2014-01-04 DIAGNOSIS — Z79899 Other long term (current) drug therapy: Secondary | ICD-10-CM | POA: Diagnosis not present

## 2014-01-04 DIAGNOSIS — I2789 Other specified pulmonary heart diseases: Secondary | ICD-10-CM | POA: Diagnosis not present

## 2014-01-04 DIAGNOSIS — I129 Hypertensive chronic kidney disease with stage 1 through stage 4 chronic kidney disease, or unspecified chronic kidney disease: Secondary | ICD-10-CM | POA: Diagnosis not present

## 2014-01-04 DIAGNOSIS — Z794 Long term (current) use of insulin: Secondary | ICD-10-CM | POA: Diagnosis not present

## 2014-01-04 DIAGNOSIS — I509 Heart failure, unspecified: Secondary | ICD-10-CM

## 2014-01-04 DIAGNOSIS — R0902 Hypoxemia: Secondary | ICD-10-CM | POA: Diagnosis present

## 2014-01-04 DIAGNOSIS — Z9981 Dependence on supplemental oxygen: Secondary | ICD-10-CM | POA: Diagnosis not present

## 2014-01-04 DIAGNOSIS — Z87891 Personal history of nicotine dependence: Secondary | ICD-10-CM | POA: Diagnosis not present

## 2014-01-04 DIAGNOSIS — I5032 Chronic diastolic (congestive) heart failure: Secondary | ICD-10-CM

## 2014-01-04 DIAGNOSIS — Z7901 Long term (current) use of anticoagulants: Secondary | ICD-10-CM | POA: Insufficient documentation

## 2014-01-04 DIAGNOSIS — I4819 Other persistent atrial fibrillation: Secondary | ICD-10-CM

## 2014-01-04 MED ORDER — HYDRALAZINE HCL 25 MG PO TABS: 37.5000 mg | ORAL_TABLET | Freq: Three times a day (TID) | ORAL | Status: DC

## 2014-01-04 NOTE — Patient Instructions (Signed)
Follow up 6 weeks  Increase hydralazine to 37.5 mg three times a day. This is 1 1/2 tablets    Do the following things EVERYDAY: 1) Weigh yourself in the morning before breakfast. Write it down and keep it in a log. 2) Take your medicines as prescribed 3) Eat low salt foods-Limit salt (sodium) to 2000 mg per day.  4) Stay as active as you can everyday 5) Limit all fluids for the day to less than 2 liters

## 2014-01-08 ENCOUNTER — Ambulatory Visit (HOSPITAL_BASED_OUTPATIENT_CLINIC_OR_DEPARTMENT_OTHER): Payer: 59

## 2014-01-15 ENCOUNTER — Ambulatory Visit (HOSPITAL_COMMUNITY)
Admission: RE | Admit: 2014-01-15 | Discharge: 2014-01-15 | Disposition: A | Discharge status: Home / Self Care | Payer: 59 | Source: Ambulatory Visit | Attending: Internal Medicine | Admitting: Internal Medicine

## 2014-01-15 ENCOUNTER — Encounter (HOSPITAL_COMMUNITY): Payer: Self-pay

## 2014-01-15 VITALS — BP 138/88 | HR 111 | Resp 26 | Wt 215.2 lb

## 2014-01-15 DIAGNOSIS — R0609 Other forms of dyspnea: Secondary | ICD-10-CM | POA: Diagnosis not present

## 2014-01-15 DIAGNOSIS — Z794 Long term (current) use of insulin: Secondary | ICD-10-CM | POA: Insufficient documentation

## 2014-01-15 DIAGNOSIS — I4891 Unspecified atrial fibrillation: Secondary | ICD-10-CM | POA: Diagnosis not present

## 2014-01-15 DIAGNOSIS — I10 Essential (primary) hypertension: Secondary | ICD-10-CM

## 2014-01-15 DIAGNOSIS — I509 Heart failure, unspecified: Secondary | ICD-10-CM | POA: Diagnosis not present

## 2014-01-15 DIAGNOSIS — I2789 Other specified pulmonary heart diseases: Secondary | ICD-10-CM | POA: Diagnosis not present

## 2014-01-15 DIAGNOSIS — N189 Chronic kidney disease, unspecified: Secondary | ICD-10-CM | POA: Insufficient documentation

## 2014-01-15 DIAGNOSIS — F1011 Alcohol abuse, in remission: Secondary | ICD-10-CM | POA: Diagnosis not present

## 2014-01-15 DIAGNOSIS — R0989 Other specified symptoms and signs involving the circulatory and respiratory systems: Principal | ICD-10-CM

## 2014-01-15 DIAGNOSIS — I5032 Chronic diastolic (congestive) heart failure: Secondary | ICD-10-CM

## 2014-01-15 DIAGNOSIS — I129 Hypertensive chronic kidney disease with stage 1 through stage 4 chronic kidney disease, or unspecified chronic kidney disease: Secondary | ICD-10-CM | POA: Diagnosis not present

## 2014-01-15 DIAGNOSIS — Z87891 Personal history of nicotine dependence: Secondary | ICD-10-CM | POA: Diagnosis not present

## 2014-01-15 DIAGNOSIS — E119 Type 2 diabetes mellitus without complications: Secondary | ICD-10-CM | POA: Insufficient documentation

## 2014-01-15 DIAGNOSIS — N183 Chronic kidney disease, stage 3 unspecified: Secondary | ICD-10-CM

## 2014-01-15 DIAGNOSIS — I482 Chronic atrial fibrillation, unspecified: Secondary | ICD-10-CM

## 2014-01-15 LAB — BASIC METABOLIC PANEL
Anion gap: 17 — ABNORMAL HIGH (ref 5–15)
BUN: 83 mg/dL — AB (ref 6–23)
CALCIUM: 9 mg/dL (ref 8.4–10.5)
CO2: 22 mEq/L (ref 19–32)
Chloride: 102 mEq/L (ref 96–112)
Creatinine, Ser: 5.57 mg/dL — ABNORMAL HIGH (ref 0.50–1.35)
GFR calc Af Amer: 12 mL/min — ABNORMAL LOW (ref >90)
GFR, EST NON AFRICAN AMERICAN: 10 mL/min — AB (ref >90)
GLUCOSE: 90 mg/dL (ref 70–99)
Potassium: 3.8 mEq/L (ref 3.7–5.3)
Sodium: 141 mEq/L (ref 137–147)

## 2014-01-15 LAB — PRO B NATRIURETIC PEPTIDE: Pro B Natriuretic peptide (BNP): 9297 pg/mL — ABNORMAL HIGH (ref 0–125)

## 2014-01-15 MED ORDER — FUROSEMIDE 10 MG/ML IJ SOLN
80.0000 mg | Freq: Once | INTRAMUSCULAR | Status: AC
  Administered 2014-01-15: 80 mg via INTRAVENOUS

## 2014-01-15 MED ORDER — TORSEMIDE 20 MG PO TABS: 80.0000 mg | ORAL_TABLET | Freq: Two times a day (BID) | ORAL | Status: DC

## 2014-01-15 NOTE — Progress Notes (Signed)
Patient ID: Joseph [name], male   DOB: 1955-09-13, 58 y.o.   MRN: [id number]   PCP: Dr. Montez Morita Referring MD: Dr. Caryl Comes Nephrology: Dr Florene Glen   58 yo with history of CKD, chronic atrial fibrillation, and chronic diastolic CHF was referred to CHF clinic for evaluation of CHF and elevated PA pressure on echo.  Patient's creatinine has been as high as 4.9, probably due to HTN and diabetes.  He follows with Dr. Florene Glen.  He has been in atrial fibrillation since 6/13 when he failed a cardioversion and it was decided to leave him in atrial fibrillation.  He has been on Eliquis.  Most recent echo in 4/15 showed EF 69-48% with PA systolic pressure 85 mmHg.  He has had difficulty recently with volume overload.     RHC in 6/15 showing elevated right and left heart filling pressures with moderate pulmonary hypertension, primarily pulmonary venous hypertension.    He returns for an acute work in due to increased dyspnea. Earlier today he was at his eye doctors appointment for a procedure however he refused due to increased dyspnea. He was instructed to come to the HF clinic for an evaluation. At his last visit hydralazine was increased 37.5 mg tid. Weight at home 209-210. SOB with exertion. Uses oxygen at night. Increaed abdominal bloating. + orthopnea. Increased cough over the last week. Not much urine output with lasix. Taking all medications.    Labs (8/14): K 4.4, creatinine 3.7 Labs (6/15): K 4.5, creatinine 4.88 Labs (10/30/13) : 4.1 Creatinine 4.22  PMH: 1. CKD: Followed by Dr. Florene Glen, likely due to HTN and diabetes.  2. Atrial fibrillation: Chronic.  DCCV attempted in 6/13 but reverted to atrial fibrillation.  Rate control since that time.  3. H/o heavy ETOH, now does not drink. 4. Type II diabetes 5. HTN 6. Chronic diastolic CHF with elevated PA pressure: Echo (4/15) with EF 55-60%, mild MR, PA systolic pressure 85 mmHg, RV normal per report. RHC (6/15) with mean RA 12, PA 68/28 mean 44, mean PCWP 23  with prominent v-waves, CI 3.5, PVR 2.8 WU.   SH: On disability, prior smoker but quit, prior heavy ETOH but quit, lives in [place].  FH: Mother with valve replacement, sister with ESRD.   ROS: All systems reviewed and negative except as per HPI.   Current Outpatient Prescriptions  Medication Sig Dispense Refill  . amLODipine (NORVASC) 10 MG tablet Take 10 mg by mouth 2 (two) times daily.      Marland Kitchen atorvastatin (LIPITOR) 40 MG tablet Take 40 mg by mouth daily.      . colchicine 0.6 MG tablet Take 0.6 mg by mouth 2 (two) times daily.       Marland Kitchen ELIQUIS 5 MG TABS tablet TAKE 1 TABLET BY MOUTH TWICE A DAY  180 tablet  0  . furosemide (LASIX) 80 MG tablet Take 160 mg by mouth 2 (two) times daily.       . hydrALAZINE (APRESOLINE) 25 MG tablet Take 1.5 tablets (37.5 mg total) by mouth 3 (three) times daily.  120 tablet  9  . HYDROcodone-acetaminophen (NORCO) 7.5-325 MG per tablet Take 0.5 tablets by mouth daily as needed for moderate pain.       Marland Kitchen insulin glulisine (APIDRA) 100 UNIT/ML injection Inject 25 Units into the skin daily.      Marland Kitchen LANTUS SOLOSTAR 100 UNIT/ML injection Inject 50 Units into the skin daily. Sliding scale      . metoprolol (LOPRESSOR) 100 MG tablet TAKE  1 TABLET BY MOUTH 2 TIMES DAILY.  180 tablet  0  . Multiple Vitamin (MULTIVITAMIN) tablet Take 1 tablet by mouth daily.      Marland Kitchen OVER THE COUNTER MEDICATION Take 1 tablet by mouth daily. For joints       No current facility-administered medications for this encounter.    BP 138/88  Pulse 111  Resp 26  Wt 215 lb 4 oz (97.637 kg)  SpO2 78% General: NAD Dyspnea with exertion.  Neck: JVP to jaw cm.  no thyromegaly or thyroid nodule.  Lungs: Clear Cardiac: Irregular.  No edema.  No carotid bruit.  Normal pedal pulses.  Abdomen: Soft, nontender, no hepatosplenomegaly, ++distended.  Skin: Intact without lesions or rashes.  Neurologic: Alert and oriented x 3.  Psych: Normal affect. Extremities: No clubbing or cyanosis.    EKG: Afib 95 bpm   Assessment/Plan: 1. Atrial fibrillation: Chronic.  Has been in atrial fibrillation for about 2 years after failing a cardioversion.  Continue rate control/anticoagulation strategy.  - Eliquis 5 mg bid (creatinine > 1.5 but no other criteria to lower the Eliquis dose are present).  - Continue current metoprolol 100 mg twice a day.  2. HTN: BP ok. Continue hydralazine 37.5 mg three times a day.    3. CKD: Followed closely  by Dr Florene Glen.  Creatinine up to 5.5. Contacted Dr Ulice Bold office for appoiontment. They will contact him to schedule appointment this week.  4. CHF: Chronic diastolic CHF with elevated PA pressure on echo.  RHC demonstrated elevated right and left heart filling pressures and moderate pulmonary hypertension.  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   Functional decline. NYHA III-IV. Volume status elevated. Weight up 5 pounds. Give 80 mg IV lasix in HF clinic--> 260 c urine out.  Stop Lasix and switch to torsemide 80 mg twice a day.  Check BMET and pro bnp now.  - Will likely need HD for fluid removal.  Has appointment with vascular Surgeon for fistula.  -On home oxygen at night   -Sleep study scheduled next week.   5. Dyspnea- Likely related to volume overload. Oxygen saturation on room air 78% but 93% on 2 liters. Will need 2 liters continuous oxygen. Contacted [facility] for home oxygen.   Creatinine today 5.57 K 3.8 Discussed with Dr McLean--> Needs follow up with Nephrology ASAP.   Follow up in 1 month  Raha Tennison NP-C  01/15/2014

## 2014-01-15 NOTE — Progress Notes (Addendum)
Patient added on after phone call received from optomologist office stating patient was severely SOB.  Patient sats 78% on RA upon arrival, increased WOB and SOB, RR 28-30.  Patient placed on 2L continuous O2 via [place], sats up to 93%, RR now 20.  Per Amy Clegg NP-C fluid level up, 22g PIV started in RLW x 1 attempt, 80 IV lasix administered, patient tolerated well.  Patient resting comfortably on stretcher in pod 3 of Adv[facility] CHF clinic with call bell and urinal in reach.  Will continue to monitor closely.  Total Urinary Output: 260cc  PIV removed and bandage placed before patient discharged from clinic visit.  Renee Pain

## 2014-01-15 NOTE — Patient Instructions (Addendum)
Follow up 1 month   Stop lasix  Take torsemide 80 mg twice a day  Do the following things EVERYDAY: 1) Weigh yourself in the morning before breakfast. Write it down and keep it in a log. 2) Take your medicines as prescribed 3) Eat low salt foods-Limit salt (sodium) to 2000 mg per day.  4) Stay as active as you can everyday 5) Limit all fluids for the day to less than 2 liters

## 2014-01-17 ENCOUNTER — Other Ambulatory Visit: Payer: Self-pay | Admitting: Internal Medicine

## 2014-01-19 ENCOUNTER — Encounter (HOSPITAL_COMMUNITY): Payer: 59

## 2014-01-23 ENCOUNTER — Encounter (HOSPITAL_COMMUNITY): Payer: 59

## 2014-01-23 ENCOUNTER — Other Ambulatory Visit (HOSPITAL_COMMUNITY): Payer: 59

## 2014-01-23 ENCOUNTER — Ambulatory Visit: Payer: 59 | Admitting: Vascular Surgery

## 2014-01-26 ENCOUNTER — Other Ambulatory Visit (HOSPITAL_COMMUNITY): Payer: Self-pay | Admitting: Cardiology

## 2014-01-30 ENCOUNTER — Encounter: Payer: Self-pay | Admitting: Vascular Surgery

## 2014-01-31 ENCOUNTER — Encounter: Payer: Self-pay | Admitting: *Deleted

## 2014-01-31 ENCOUNTER — Ambulatory Visit (INDEPENDENT_AMBULATORY_CARE_PROVIDER_SITE_OTHER): Payer: 59 | Admitting: Vascular Surgery

## 2014-01-31 ENCOUNTER — Encounter: Payer: Self-pay | Admitting: Vascular Surgery

## 2014-01-31 ENCOUNTER — Ambulatory Visit (HOSPITAL_COMMUNITY)
Admission: RE | Admit: 2014-01-31 | Discharge: 2014-01-31 | Disposition: A | Discharge status: Home / Self Care | Payer: 59 | Source: Ambulatory Visit | Attending: Vascular Surgery | Admitting: Vascular Surgery

## 2014-01-31 ENCOUNTER — Other Ambulatory Visit: Payer: Self-pay | Admitting: *Deleted

## 2014-01-31 ENCOUNTER — Ambulatory Visit (INDEPENDENT_AMBULATORY_CARE_PROVIDER_SITE_OTHER)
Admission: RE | Admit: 2014-01-31 | Discharge: 2014-01-31 | Disposition: A | Discharge status: Home / Self Care | Payer: 59 | Source: Ambulatory Visit | Attending: Vascular Surgery | Admitting: Vascular Surgery

## 2014-01-31 VITALS — BP 134/76 | HR 91 | Ht 72.0 in | Wt 208.0 lb

## 2014-01-31 DIAGNOSIS — N184 Chronic kidney disease, stage 4 (severe): Secondary | ICD-10-CM

## 2014-01-31 DIAGNOSIS — Z01810 Encounter for preprocedural cardiovascular examination: Secondary | ICD-10-CM

## 2014-01-31 NOTE — Patient Instructions (Signed)
Vascular Access for Hemodialysis A vascular access is a connection between two blood vessels that allows blood to be easily removed from the body and returned to the body during hemodialysis. Hemodialysis is a procedure in which a machine outside of the body filters the blood. There are three types of vascular accesses:   Arteriovenous fistula. This is a connection between an artery and a vein (usually in the arm) that is made by sewing them together. Blood in the artery flows directly into the vein, causing it to get larger over time. This makes it easier for the vein to be used for hemodialysis. An arteriovenous fistula takes 1-6 months to develop after surgery.   Arteriovenous graft. This is a connection between an artery and a vein in the arm that is made with a tube. An arteriovenous graft can be used within 2-3 weeks of surgery.   Venous catheter. This is a thin, flexible tube that is placed in a large vein (usually in the neck, chest, or groin). A venous catheter for hemodialysis contains two tubes that come out of the skin. A venous catheter can be used right away. It is usually used as a temporary access if you need hemodialysis before a fistula or graft has developed. It may also be used as a permanent access if a fistula or graft cannot be created. WHICH TYPE OF ACCESS IS BEST FOR ME? The type of access that is best for you depends on the size and strength of your veins.  A fistula is usually the preferred type of access. It can last several years and is less likely than the other types of accesses to become infected or to cause blood clots within a blood vessel (thrombosis). However, a fistula is not an option for everyone. If your veins are not the right size, a graft may be used instead. Grafts require you to have strong veins. If your veins are not strong enough for a graft, a catheter may be used. Catheters are more likely than fistulas and grafts to become infected or to have thrombosis.   Sometimes, only one type of access is an option. Your health care provider will help you determine which type of access is best for you.  HOW IS A VASCULAR ACCESS USED? The way the access is used depends on the type of access:   If the access is a fistula or graft, two needles are inserted through the skin into the access before each hemodialysis session. Blood leaves the body through one of the needles and travels through a tube to the hemodialysis machine (dialyzer). It then flows through another tube and returns to the body through the second needle.   If the access is a catheter, one tube is connected directly to the tube that leads to the dialyzer and the other is connected to a tube that leads away from the dialyzer. Blood leaves the body through one tube and returns to the body through the other.  WHAT KIND OF PROBLEMS CAN OCCUR WITH VASCULAR ACCESSES?  Blood clots within a blood vessel (thrombosis). Thrombosis can lead to a narrowing of a blood vessel or tube (stenosis). If thrombosis occurs frequently, another access site may be created as a backup.   Infection.  These problems are most likely to occur with a venous catheter and least likely to occur with an arteriovenous fistula.  HOW DO I CARE FOR MY VASCULAR ACCESS? Wear a medical alert bracelet. This tells health care providers that you are   a dialysis patient in the case of an emergency and allows them to care for your veins appropriately. If you have a graft or fistula:   A "bruit" is a noise that is heard with a stethoscope and a "thrill" is a vibration felt over the graft or fistula. The presence of the bruit and thrill indicates that the access is working. You will be taught to feel for the thrill each day. If this is not felt, the access may be clotted. Call your health care provider.   You may use the arm where your vascular access is located freely after the site heals. Keep the following in mind:   Avoid pressure on  the arm.   Avoid lifting heavy objects with the arm.   Avoid sleeping on the arm.   Avoid wearing tight-sleeved shirts or jewelry around the graft or fistula.   Do not allow blood pressure monitoring or needle punctures on the side where the graft or fistula is located.   With permission from your health care provider, you may do exercises to help with blood flow through a fistula. These exercises involve squeezing a rubber ball or other soft objects as instructed. SEEK MEDICAL CARE IF:   Chills develop.   You have an oral temperature above 102 F (38.9 C).  Swelling around the graft or fistula gets worse.   New pain develops.   Pus or other fluid (drainage) is seen at the vascular access site.   Skin redness or red streaking is seen on the skin around, above, or below the vascular access. SEEK IMMEDIATE MEDICAL CARE IF:   Pain, numbness, or an unusual pale skin color develops in the hand on the side of your fistula.   Dizziness or weakness develops that you have not had before.   The vascular access has bleeding that cannot be easily controlled. Document Released: 06/27/2002 Document Revised: 08/21/2013 Document Reviewed: 08/23/2012 [employer] Patient Information 2015 [employer]. This information is not intended to replace advice given to you by your health care provider. Make sure you discuss any questions you have with your health care provider.  

## 2014-01-31 NOTE — Assessment & Plan Note (Signed)
Based on his vein mapping, he does not appear to be a candidate for a fistula. However, I explained I would look at his veins myself with ultrasound at the time of surgery. If at all possible certainly we will place a fistula. However his veins are not adequate I would plan on placement of an AV graft given that his GFR is only 10. I have explained the indications for placement of an AV fistula or AV graft. I've explained that if at all possible we will place an AV fistula.  I have reviewed the risks of placement of an AV fistula including but not limited to: failure of the fistula to mature, need for subsequent interventions, and thrombosis. In addition I have reviewed the potential complications of placement of an AV graft. These risks include, but are not limited to, graft thrombosis, graft infection, wound healing problems, bleeding, arm swelling, and steal syndrome. All the patient's questions were answered and they are agreeable to proceed with surgery. The surgery is scheduled for 02/13/2014. We will stop his Eliquis 3 days prior to surgery.

## 2014-01-31 NOTE — Progress Notes (Signed)
Patient ID: Joseph [name], male   DOB: 1955-06-07, 58 y.o.   MRN: [id number]  Reason for Consult: Evaluate for hemodialysis access   Referred by Dr. Erling Cruz  Subjective:     HPI:  Joseph [name] is a 58 y.o. male with stage IV chronic kidney disease. He is not yet on dialysis. He is nearing dialysis. We were asked to place hemodialysis access. He is right-handed. His renal failure is secondary to diabetic nephropathy.  He has had some fluid retention. He denies any other uremic symptoms including nausea, vomiting, fatigue, anorexia, or palpitations. He does have a history of atrial for relation and is on Eliquis.   Past Medical History  Diagnosis Date  . Atrial fibrillation     a. 11/18/2011 s/p DCCV - 150J;  b. Anticoagulation w/ Apixaban;  c. 11/2011 back in afib->asymptomatic.  . H/O alcohol abuse     a. drinks heavily on the weekends.  . Hypertension   . Diabetes mellitus   . Heart murmur     a. 09/2011 Echo: EF 55-60%, Triv AI, Mild MR, mildly dil LA.  . Diabetic peripheral neuropathy   . CKD (chronic kidney disease), stage III   . CHF (congestive heart failure)    Family History  Problem Relation Age of Onset  . Heart disease Mother     before age 42  . Diabetes Father   . Diabetes Sister   . Peripheral vascular disease Sister     amputation   Past Surgical History  Procedure Laterality Date  . Eye sx  11/11/2011    left eye  . Cardioversion  11/18/2011    Procedure: CARDIOVERSION;  Surgeon: Josue Hector, MD;  Location: [facility] ENDOSCOPY;  Service: Cardiovascular;  Laterality: N/A;    Short Social History:  History  Substance Use Topics  . Smoking status: Former Smoker -- 0.01 packs/day for 5 years    Types: Cigarettes    Quit date: 04/20/1994  . Smokeless tobacco: Not on file     Comment: some in college  . Alcohol Use: Yes     Comment: occasionally beer    No Known Allergies  Current Outpatient Prescriptions  Medication Sig Dispense Refill  . amLODipine  (NORVASC) 10 MG tablet Take 10 mg by mouth 2 (two) times daily.      Marland Kitchen atorvastatin (LIPITOR) 40 MG tablet Take 40 mg by mouth daily.      . colchicine 0.6 MG tablet Take 0.6 mg by mouth 2 (two) times daily.       Marland Kitchen ELIQUIS 5 MG TABS tablet TAKE 1 TABLET BY MOUTH TWICE A DAY  180 tablet  0  . furosemide (LASIX) 80 MG tablet Take 80 mg by mouth 2 (two) times daily.       . hydrALAZINE (APRESOLINE) 25 MG tablet Take 1.5 tablets (37.5 mg total) by mouth 3 (three) times daily.  120 tablet  9  . hydrALAZINE (APRESOLINE) 25 MG tablet Take 1.5 tablets (37.5 mg total) by mouth 3 (three) times daily.  120 tablet  3  . insulin glulisine (APIDRA) 100 UNIT/ML injection Inject 25 Units into the skin daily.      Marland Kitchen LANTUS SOLOSTAR 100 UNIT/ML injection Inject 50 Units into the skin daily. Sliding scale      . metoprolol (LOPRESSOR) 100 MG tablet TAKE 1 TABLET BY MOUTH 2 TIMES DAILY.  180 tablet  0  . Multiple Vitamin (MULTIVITAMIN) tablet Take 1 tablet by mouth daily.      Marland Kitchen  OVER THE COUNTER MEDICATION Take 1 tablet by mouth daily. For joints      . HYDROcodone-acetaminophen (NORCO) 7.5-325 MG per tablet Take 0.5 tablets by mouth daily as needed for moderate pain.       Marland Kitchen torsemide (DEMADEX) 20 MG tablet Take 4 tablets (80 mg total) by mouth 2 (two) times daily.  240 tablet  6   No current facility-administered medications for this visit.    Review of Systems  Constitutional: Negative for chills and fever.  Eyes: Negative for loss of vision.  Respiratory: Negative for cough and wheezing.  Cardiovascular: Negative for chest pain, chest tightness, claudication, dyspnea with exertion, orthopnea and palpitations.  GI: Negative for blood in stool and vomiting.  GU: Negative for dysuria and hematuria.  Musculoskeletal: Negative for leg pain, joint pain and myalgias.  Skin: Negative for rash and wound.  Neurological: Negative for dizziness and speech difficulty.  Hematologic: Negative for bruises/bleeds  easily. Psychiatric: Negative for depressed mood.        Objective:  Objective  Filed Vitals:   01/31/14 0951  BP: 134/76  Pulse: 91  Height: 6' (1.829 m)  Weight: 208 lb (94.348 kg)  SpO2: 93%   Body mass index is 28.2 kg/(m^2).  Physical Exam  Constitutional: He is oriented to person, place, and time. He appears well-developed and well-nourished.  HENT:  Head: Normocephalic and atraumatic.  Neck: Neck supple. No JVD present. No thyromegaly present.  Cardiovascular: Normal rate, regular rhythm and normal heart sounds.  Exam reveals no friction rub.   No murmur heard. Pulses:      Radial pulses are 2+ on the right side, and 2+ on the left side.  I do not detect carotid bruits.  Pulmonary/Chest: Breath sounds normal. He has no wheezes. He has no rales.  Abdominal: Soft. Bowel sounds are normal. There is no tenderness.  Musculoskeletal: Normal range of motion. He exhibits no edema.  Lymphadenopathy:    He has no cervical adenopathy.  Neurological: He is alert and oriented to person, place, and time. He has normal strength. No sensory deficit.  Skin: No lesion and no rash noted.  Psychiatric: He has a normal mood and affect.    Data: ARTERIAL DOPPLER: I have independently interpreted his arterial Doppler study which shows triphasic waveforms in the radial and ulnar positions bilaterally.  VEIN MAP: I have independently interpreted his vein mapping of both upper extremities. He does not appear to have an adequate cephalic vein or basilic vein in either arm for a fistula.  I have reviewed his records from [facility]. The patient has stage IV chronic kidney disease. On 12/26/2013 GFR was 10      Assessment/Plan:     Chronic kidney disease (CKD), stage IV (severe) Based on his vein mapping, he does not appear to be a candidate for a fistula. However, I explained I would look at his veins myself with ultrasound at the time of surgery. If at all possible  certainly we will place a fistula. However his veins are not adequate I would plan on placement of an AV graft given that his GFR is only 10. I have explained the indications for placement of an AV fistula or AV graft. I've explained that if at all possible we will place an AV fistula.  I have reviewed the risks of placement of an AV fistula including but not limited to: failure of the fistula to mature, need for subsequent interventions, and thrombosis. In addition I have  reviewed the potential complications of placement of an AV graft. These risks include, but are not limited to, graft thrombosis, graft infection, wound healing problems, bleeding, arm swelling, and steal syndrome. All the patient's questions were answered and they are agreeable to proceed with surgery. The surgery is scheduled for 02/13/2014. We will stop his Eliquis 3 days prior to surgery.      Angelia Mould MD [facility]

## 2014-02-12 ENCOUNTER — Encounter (HOSPITAL_COMMUNITY): Payer: Self-pay | Admitting: *Deleted

## 2014-02-12 MED ORDER — DEXTROSE 5 % IV SOLN
1.5000 g | INTRAVENOUS | Status: AC
  Administered 2014-02-13: 1.5 g via INTRAVENOUS
  Filled 2014-02-12: qty 1.5

## 2014-02-12 MED ORDER — SODIUM CHLORIDE 0.9 % IV SOLN: INTRAVENOUS | Status: DC

## 2014-02-12 NOTE — Progress Notes (Signed)
02/12/14 1844  OBSTRUCTIVE SLEEP APNEA  Have you ever been diagnosed with sleep apnea through a sleep study? No  Do you snore loudly (loud enough to be heard through closed doors)?  1  Do you often feel tired, fatigued, or sleepy during the daytime? 0  Has anyone observed you stop breathing during your sleep? 1  Do you have, or are you being treated for high blood pressure? 1  BMI more than 35 kg/m2? 0  Age over 58 years old? 1  Gender: 1  Obstructive Sleep Apnea Score 5

## 2014-02-12 NOTE — Progress Notes (Signed)
Pt denies SOB and chest pain but is under the care of Dr. Algernon Huxley ( cardiology).  Pt stated that he had a chest x ray recently while incarcerated and was  diagnosed with pneumonia. Pt stated that he stopped Eliquis on Saturday, 02/10/14 as advised by MD.

## 2014-02-13 ENCOUNTER — Encounter (HOSPITAL_COMMUNITY): Payer: Self-pay | Admitting: Anesthesiology

## 2014-02-13 ENCOUNTER — Encounter (HOSPITAL_COMMUNITY): Payer: 59

## 2014-02-13 ENCOUNTER — Ambulatory Visit (HOSPITAL_COMMUNITY)
Admission: RE | Admit: 2014-02-13 | Discharge: 2014-02-13 | Disposition: A | Discharge status: Home / Self Care | Payer: 59 | Source: Ambulatory Visit | Attending: Vascular Surgery | Admitting: Vascular Surgery

## 2014-02-13 ENCOUNTER — Ambulatory Visit (HOSPITAL_COMMUNITY): Payer: 59

## 2014-02-13 ENCOUNTER — Encounter (HOSPITAL_COMMUNITY): Payer: 59 | Admitting: Anesthesiology

## 2014-02-13 ENCOUNTER — Encounter (HOSPITAL_COMMUNITY)
Admission: RE | Disposition: A | Discharge status: Home / Self Care | Payer: Self-pay | Source: Ambulatory Visit | Attending: Vascular Surgery

## 2014-02-13 ENCOUNTER — Ambulatory Visit (HOSPITAL_COMMUNITY): Payer: 59 | Admitting: Anesthesiology

## 2014-02-13 DIAGNOSIS — I4891 Unspecified atrial fibrillation: Secondary | ICD-10-CM | POA: Insufficient documentation

## 2014-02-13 DIAGNOSIS — I129 Hypertensive chronic kidney disease with stage 1 through stage 4 chronic kidney disease, or unspecified chronic kidney disease: Secondary | ICD-10-CM | POA: Diagnosis not present

## 2014-02-13 DIAGNOSIS — N184 Chronic kidney disease, stage 4 (severe): Secondary | ICD-10-CM | POA: Diagnosis present

## 2014-02-13 DIAGNOSIS — E1142 Type 2 diabetes mellitus with diabetic polyneuropathy: Secondary | ICD-10-CM | POA: Diagnosis not present

## 2014-02-13 DIAGNOSIS — I10 Essential (primary) hypertension: Secondary | ICD-10-CM

## 2014-02-13 DIAGNOSIS — E1121 Type 2 diabetes mellitus with diabetic nephropathy: Secondary | ICD-10-CM | POA: Diagnosis not present

## 2014-02-13 DIAGNOSIS — N183 Chronic kidney disease, stage 3 unspecified: Secondary | ICD-10-CM

## 2014-02-13 DIAGNOSIS — R011 Cardiac murmur, unspecified: Secondary | ICD-10-CM | POA: Insufficient documentation

## 2014-02-13 DIAGNOSIS — Z87891 Personal history of nicotine dependence: Secondary | ICD-10-CM | POA: Insufficient documentation

## 2014-02-13 DIAGNOSIS — I509 Heart failure, unspecified: Secondary | ICD-10-CM | POA: Insufficient documentation

## 2014-02-13 HISTORY — PX: AV FISTULA PLACEMENT: SHX1204

## 2014-02-13 HISTORY — DX: Pneumonia, unspecified organism: J18.9

## 2014-02-13 LAB — POCT I-STAT 4, (NA,K, GLUC, HGB,HCT)
Glucose, Bld: 151 mg/dL — ABNORMAL HIGH (ref 70–99)
HEMATOCRIT: 27 % — AB (ref 39.0–52.0)
Hemoglobin: 9.2 g/dL — ABNORMAL LOW (ref 13.0–17.0)
Potassium: 3.7 mEq/L (ref 3.7–5.3)
SODIUM: 144 meq/L (ref 137–147)

## 2014-02-13 LAB — PROTIME-INR
INR: 1.27 (ref 0.00–1.49)
Prothrombin Time: 16 seconds — ABNORMAL HIGH (ref 11.6–15.2)

## 2014-02-13 LAB — APTT: APTT: 42 s — AB (ref 24–37)

## 2014-02-13 LAB — GLUCOSE, CAPILLARY
Glucose-Capillary: 158 mg/dL — ABNORMAL HIGH (ref 70–99)
Glucose-Capillary: 142 mg/dL — ABNORMAL HIGH (ref 70–99)

## 2014-02-13 SURGERY — INSERTION OF ARTERIOVENOUS (AV) GORE-TEX GRAFT ARM
Anesthesia: Monitor Anesthesia Care | Site: Arm Upper | Laterality: Left

## 2014-02-13 MED ORDER — PROPOFOL 10 MG/ML IV BOLUS
INTRAVENOUS | Status: AC
  Filled 2014-02-13: qty 20

## 2014-02-13 MED ORDER — MIDAZOLAM HCL 2 MG/2ML IJ SOLN
INTRAMUSCULAR | Status: AC
  Filled 2014-02-13: qty 2

## 2014-02-13 MED ORDER — PROPOFOL 10 MG/ML IV BOLUS
INTRAVENOUS | Status: DC | PRN
  Administered 2014-02-13: 20 mg via INTRAVENOUS

## 2014-02-13 MED ORDER — THROMBIN 20000 UNITS EX SOLR
CUTANEOUS | Status: AC
  Filled 2014-02-13: qty 20000

## 2014-02-13 MED ORDER — SODIUM CHLORIDE 0.9 % IV SOLN
INTRAVENOUS | Status: DC | PRN
  Administered 2014-02-13: 07:00:00 via INTRAVENOUS

## 2014-02-13 MED ORDER — PROTAMINE SULFATE 10 MG/ML IV SOLN
INTRAVENOUS | Status: AC
  Filled 2014-02-13: qty 5

## 2014-02-13 MED ORDER — ONDANSETRON HCL 4 MG/2ML IJ SOLN
INTRAMUSCULAR | Status: AC
  Filled 2014-02-13: qty 2

## 2014-02-13 MED ORDER — FENTANYL CITRATE 0.05 MG/ML IJ SOLN
INTRAMUSCULAR | Status: DC | PRN
  Administered 2014-02-13 (×3): 50 ug via INTRAVENOUS

## 2014-02-13 MED ORDER — LIDOCAINE-EPINEPHRINE (PF) 1 %-1:200000 IJ SOLN
INTRAMUSCULAR | Status: DC | PRN
  Administered 2014-02-13: 30 mL

## 2014-02-13 MED ORDER — HYDROCODONE-ACETAMINOPHEN 7.5-325 MG PO TABS: 0.5000 | ORAL_TABLET | Freq: Every day | ORAL | Status: DC | PRN

## 2014-02-13 MED ORDER — PROPOFOL INFUSION 10 MG/ML OPTIME
INTRAVENOUS | Status: DC | PRN
  Administered 2014-02-13: 25 ug/kg/min via INTRAVENOUS

## 2014-02-13 MED ORDER — EPHEDRINE SULFATE 50 MG/ML IJ SOLN
INTRAMUSCULAR | Status: AC
  Filled 2014-02-13: qty 1

## 2014-02-13 MED ORDER — ONDANSETRON HCL 4 MG/2ML IJ SOLN
INTRAMUSCULAR | Status: DC | PRN
  Administered 2014-02-13: 4 mg via INTRAVENOUS

## 2014-02-13 MED ORDER — HEPARIN SODIUM (PORCINE) 1000 UNIT/ML IJ SOLN
INTRAMUSCULAR | Status: DC | PRN
  Administered 2014-02-13: 7000 [IU] via INTRAVENOUS

## 2014-02-13 MED ORDER — LIDOCAINE HCL (CARDIAC) 20 MG/ML IV SOLN
INTRAVENOUS | Status: DC | PRN
  Administered 2014-02-13: 30 mg via INTRAVENOUS

## 2014-02-13 MED ORDER — LIDOCAINE HCL (CARDIAC) 20 MG/ML IV SOLN
INTRAVENOUS | Status: AC
  Filled 2014-02-13: qty 5

## 2014-02-13 MED ORDER — SODIUM CHLORIDE 0.9 % IJ SOLN
INTRAMUSCULAR | Status: AC
  Filled 2014-02-13: qty 10

## 2014-02-13 MED ORDER — HEPARIN SODIUM (PORCINE) 1000 UNIT/ML IJ SOLN
INTRAMUSCULAR | Status: AC
Start: 2014-02-13 — End: 2014-02-13
  Filled 2014-02-13: qty 1

## 2014-02-13 MED ORDER — LIDOCAINE HCL (PF) 1 % IJ SOLN
INTRAMUSCULAR | Status: AC
  Filled 2014-02-13: qty 30

## 2014-02-13 MED ORDER — 0.9 % SODIUM CHLORIDE (POUR BTL) OPTIME
TOPICAL | Status: DC | PRN
  Administered 2014-02-13: 1000 mL

## 2014-02-13 MED ORDER — SODIUM CHLORIDE 0.9 % IR SOLN
Status: DC | PRN
  Administered 2014-02-13: 09:00:00

## 2014-02-13 MED ORDER — FENTANYL CITRATE 0.05 MG/ML IJ SOLN
INTRAMUSCULAR | Status: AC
  Filled 2014-02-13: qty 5

## 2014-02-13 MED ORDER — LIDOCAINE HCL (PF) 1 % IJ SOLN
INTRAMUSCULAR | Status: DC | PRN
  Administered 2014-02-13: 17 mL

## 2014-02-13 MED ORDER — LIDOCAINE-EPINEPHRINE (PF) 1 %-1:200000 IJ SOLN
INTRAMUSCULAR | Status: AC
  Filled 2014-02-13: qty 10

## 2014-02-13 MED ORDER — PROTAMINE SULFATE 10 MG/ML IV SOLN
INTRAVENOUS | Status: DC | PRN
  Administered 2014-02-13: 40 mg via INTRAVENOUS

## 2014-02-13 MED ORDER — MIDAZOLAM HCL 5 MG/5ML IJ SOLN
INTRAMUSCULAR | Status: DC | PRN
  Administered 2014-02-13: 2 mg via INTRAVENOUS

## 2014-02-13 SURGICAL SUPPLY — 39 items
ADH SKN CLS APL DERMABOND .7 (GAUZE/BANDAGES/DRESSINGS) ×4
ARMBAND PINK RESTRICT EXTREMIT (MISCELLANEOUS) ×4 IMPLANT
BLADE SURG 10 STRL SS (BLADE) ×8 IMPLANT
CANISTER SUCTION 2500CC (MISCELLANEOUS) ×4 IMPLANT
CLIP TI MEDIUM 6 (CLIP) ×4 IMPLANT
CLIP TI WIDE RED SMALL 6 (CLIP) ×7 IMPLANT
COVER PROBE W GEL 5X96 (DRAPES) ×8 IMPLANT
COVER SURGICAL LIGHT HANDLE (MISCELLANEOUS) ×4 IMPLANT
DECANTER SPIKE VIAL GLASS SM (MISCELLANEOUS) ×4 IMPLANT
DERMABOND ADVANCED (GAUZE/BANDAGES/DRESSINGS) ×4
DERMABOND ADVANCED .7 DNX12 (GAUZE/BANDAGES/DRESSINGS) ×3 IMPLANT
DRAIN PENROSE 1/2X12 LTX STRL (WOUND CARE) IMPLANT
ELECT REM PT RETURN 9FT ADLT (ELECTROSURGICAL) ×4
ELECTRODE REM PT RTRN 9FT ADLT (ELECTROSURGICAL) ×2 IMPLANT
GLOVE BIO SURGEON STRL SZ 6.5 (GLOVE) ×2 IMPLANT
GLOVE BIO SURGEON STRL SZ7.5 (GLOVE) ×4 IMPLANT
GLOVE BIO SURGEONS STRL SZ 6.5 (GLOVE) ×1
GLOVE BIOGEL PI IND STRL 6.5 (GLOVE) ×2 IMPLANT
GLOVE BIOGEL PI IND STRL 8 (GLOVE) ×2 IMPLANT
GLOVE BIOGEL PI INDICATOR 6.5 (GLOVE) ×4
GLOVE BIOGEL PI INDICATOR 8 (GLOVE) ×2
GLOVE SURG SS PI 7.0 STRL IVOR (GLOVE) ×9 IMPLANT
GOWN STRL REUS W/ TWL LRG LVL3 (GOWN DISPOSABLE) ×5 IMPLANT
GOWN STRL REUS W/ TWL XL LVL3 (GOWN DISPOSABLE) ×2 IMPLANT
GOWN STRL REUS W/TWL LRG LVL3 (GOWN DISPOSABLE) ×8
GOWN STRL REUS W/TWL XL LVL3 (GOWN DISPOSABLE) ×4
GRAFT GORETEX STRT 4-7X45 (Vascular Products) ×3 IMPLANT
KIT BASIN OR (CUSTOM PROCEDURE TRAY) ×4 IMPLANT
KIT ROOM TURNOVER OR (KITS) ×4 IMPLANT
NS IRRIG 1000ML POUR BTL (IV SOLUTION) ×4 IMPLANT
PACK CV ACCESS (CUSTOM PROCEDURE TRAY) ×4 IMPLANT
PAD ARMBOARD 7.5X6 YLW CONV (MISCELLANEOUS) ×8 IMPLANT
SPONGE SURGIFOAM ABS GEL 100 (HEMOSTASIS) IMPLANT
SUT PROLENE 6 0 BV (SUTURE) ×16 IMPLANT
SUT VIC AB 3-0 SH 27 (SUTURE) ×8
SUT VIC AB 3-0 SH 27X BRD (SUTURE) ×3 IMPLANT
SUT VICRYL 4-0 PS2 18IN ABS (SUTURE) ×7 IMPLANT
UNDERPAD 30X30 INCONTINENT (UNDERPADS AND DIAPERS) ×4 IMPLANT
WATER STERILE IRR 1000ML POUR (IV SOLUTION) ×4 IMPLANT

## 2014-02-13 NOTE — Discharge Instructions (Signed)

## 2014-02-13 NOTE — Anesthesia Postprocedure Evaluation (Signed)
  Anesthesia Post-op Note  Patient: Joseph [name]  Procedure(s) Performed: Procedure(s): INSERTION OF ARTERIOVENOUS (AV) GORE-TEX GRAFT ARM (Left)  Patient Location: PACU  Anesthesia Type:MAC  Level of Consciousness: awake, alert , oriented and patient cooperative  Airway and Oxygen Therapy: Patient Spontanous Breathing  Post-op Pain: none  Post-op Assessment: Post-op Vital signs reviewed, Patient's Cardiovascular Status Stable, Respiratory Function Stable, Patent Airway, No signs of Nausea or vomiting and Pain level controlled  Post-op Vital Signs: Reviewed and stable  Last Vitals:  Filed Vitals:   02/13/14 1100  BP:   Pulse:   Temp: 36.7 C  Resp:     Complications: No apparent anesthesia complications

## 2014-02-13 NOTE — Op Note (Signed)
    NAME: FLYNN GWYN    MRN: [id number] DOB: [birth year]    DATE OF OPERATION: 02/13/2014  PREOP DIAGNOSIS: Stage IV chronic kidney disease  POSTOP DIAGNOSIS: Same  PROCEDURE: Left forearm AV graft  SURGEON: Judeth Cornfield. Scot Dock, MD, FACS  ASSIST: Silva Bandy, [facility]  ANESTHESIA: local with sedation   EBL: minimal  INDICATIONS: Joseph [name] is a 58 y.o. male who presents for new access. Preoperative vein mapping did not show any usable veins for a fistula.  FINDINGS: My own duplex confirmed that there was no adequate cephalic or basilic vein for a fistula. The brachial vein was 3.5 mm. Brachial artery was 3.5 mm.  TECHNIQUE: The patient was taken to the operative room and sedated by anesthesia. The left upper extremity was prepped and draped in usual sterile fashion. After the skin was infiltrated with functional lidocaine, a longitudinal incision was made just above the antecubital level. Here the brachial vein and adjacent brachial artery were dissected free and controlled with vessel loops.  The vein was approximately 3.5 mm. Using 1 distal counterincision, a 4-7 mm PTFE graft was tunneled in a loop fashion in the forearm with the arterial aspect of the graft tunneled along the lateral aspect of the forearm. The patient was then heparinized. The brachial artery was clamped proximally and distally and a longitudinal arteriotomy was made. A segment of the 4 mm end of the graft was excised, the graft slightly spatulated, and sewn into side to the brachial artery using continuous 6-0 Prolene suture. The graft and for the appropriate length for anastomosis to the brachial vein. The vein was ligated distally and spatulated proximally. The graft was cut to appropriate length, spatulated, and sewn into into the vein using continuous 6-0 Prolene suture. At the completion was a good thrill in the graft and a palpable radial pulse. The heparin was partially reversed with protamine. The wound was closed  with deeper 3-0 Vicryl and the skin closed with 4-0 Vicryl. Dermabond was applied. The patient tolerated the procedure well and was transferred to the recovery room in stable condition. All needle and sponge counts were correct.  Deitra Mayo, MD, FACS [facility]  DATE OF DICTATION:   02/13/2014

## 2014-02-13 NOTE — Interval H&P Note (Signed)
History and Physical Interval Note:  02/13/2014 7:20 AM  Joseph [name]  has presented today for surgery, with the diagnosis of Chronic kidney disease, stage 5  The various methods of treatment have been discussed with the patient and family. After consideration of risks, benefits and other options for treatment, the patient has consented to  Procedure(s): ARTERIOVENOUS (AV) FISTULA CREATION (Left) as a surgical intervention .  The patient's history has been reviewed, patient examined, no change in status, stable for surgery.  I have reviewed the patient's chart and labs.  Questions were answered to the patient's satisfaction.     DICKSON,CHRISTOPHER S

## 2014-02-13 NOTE — Transfer of Care (Signed)
Immediate Anesthesia Transfer of Care Note  Patient: Joseph [name]  Procedure(s) Performed: Procedure(s): INSERTION OF ARTERIOVENOUS (AV) GORE-TEX GRAFT ARM (Left)  Patient Location: PACU  Anesthesia Type:MAC  Level of Consciousness: awake, alert , oriented and sedated  Airway & Oxygen Therapy: Patient Spontanous Breathing and Patient connected to nasal cannula oxygen  Post-op Assessment: Report given to PACU RN, Post -op Vital signs reviewed and stable and Patient moving all extremities  Post vital signs: Reviewed and stable  Complications: No apparent anesthesia complications

## 2014-02-13 NOTE — Addendum Note (Signed)
Addendum created 02/13/14 1457 by Purvis Kilts, CRNA   Modules edited: Anesthesia Events

## 2014-02-13 NOTE — Anesthesia Preprocedure Evaluation (Addendum)
Anesthesia Evaluation  Patient identified by MRN, date of birth, ID band Patient awake    Reviewed: Allergy & Precautions, H&P , NPO status , Patient's Chart, lab work & pertinent test results  History of Anesthesia Complications Negative for: history of anesthetic complications  Airway Mallampati: II  TM Distance: >3 FB Neck ROM: Full    Dental  (+) Edentulous Upper, Edentulous Lower   Pulmonary sleep apnea , pneumonia -, resolved, COPD oxygen dependent, former smoker (quit 1996),  breath sounds clear to auscultation        Cardiovascular hypertension, Pt. on medications and Pt. on home beta blockers +CHF (diastolic) + dysrhythmias Atrial Fibrillation Rhythm:Irregular Rate:Normal  Echo (4/15) with EF 55-60%, mild MR, PA systolic pressure 85 mmHg, RV normal per report.     Neuro/Psych negative neurological ROS     GI/Hepatic (+)     substance abuse  alcohol use,   Endo/Other  diabetes (glu 151), Insulin Dependent  Renal/GU CRFRenal disease (K+ 3.7)     Musculoskeletal   Abdominal   Peds  Hematology  (+) Blood dyscrasia (Hb 9.2, eliquis), ,   Anesthesia Other Findings   Reproductive/Obstetrics                            Anesthesia Physical Anesthesia Plan  ASA: III  Anesthesia Plan: MAC   Post-op Pain Management:    Induction: Intravenous  Airway Management Planned: Simple Face Mask and Natural Airway  Additional Equipment:   Intra-op Plan:   Post-operative Plan:   Informed Consent: I have reviewed the patients History and Physical, chart, labs and discussed the procedure including the risks, benefits and alternatives for the proposed anesthesia with the patient or authorized representative who has indicated his/her understanding and acceptance.     Plan Discussed with: CRNA and Surgeon  Anesthesia Plan Comments: (Plan routine monitors, MAC)        Anesthesia Quick  Evaluation

## 2014-02-13 NOTE — H&P (View-Only) (Signed)
Patient ID: Joseph [name], male   DOB: 1955/10/07, 58 y.o.   MRN: [id number]  Reason for Consult: Evaluate for hemodialysis access   Referred by Dr. Erling Cruz  Subjective:     HPI:  Joseph [name] is a 58 y.o. male with stage IV chronic kidney disease. He is not yet on dialysis. He is nearing dialysis. We were asked to place hemodialysis access. He is right-handed. His renal failure is secondary to diabetic nephropathy.  He has had some fluid retention. He denies any other uremic symptoms including nausea, vomiting, fatigue, anorexia, or palpitations. He does have a history of atrial for relation and is on Eliquis.   Past Medical History  Diagnosis Date  . Atrial fibrillation     a. 11/18/2011 s/p DCCV - 150J;  b. Anticoagulation w/ Apixaban;  c. 11/2011 back in afib->asymptomatic.  . H/O alcohol abuse     a. drinks heavily on the weekends.  . Hypertension   . Diabetes mellitus   . Heart murmur     a. 09/2011 Echo: EF 55-60%, Triv AI, Mild MR, mildly dil LA.  . Diabetic peripheral neuropathy   . CKD (chronic kidney disease), stage III   . CHF (congestive heart failure)    Family History  Problem Relation Age of Onset  . Heart disease Mother     before age 10  . Diabetes Father   . Diabetes Sister   . Peripheral vascular disease Sister     amputation   Past Surgical History  Procedure Laterality Date  . Eye sx  11/11/2011    left eye  . Cardioversion  11/18/2011    Procedure: CARDIOVERSION;  Surgeon: Josue Hector, MD;  Location: [facility] ENDOSCOPY;  Service: Cardiovascular;  Laterality: N/A;    Short Social History:  History  Substance Use Topics  . Smoking status: Former Smoker -- 0.01 packs/day for 5 years    Types: Cigarettes    Quit date: 04/20/1994  . Smokeless tobacco: Not on file     Comment: some in college  . Alcohol Use: Yes     Comment: occasionally beer    No Known Allergies  Current Outpatient Prescriptions  Medication Sig Dispense Refill  . amLODipine  (NORVASC) 10 MG tablet Take 10 mg by mouth 2 (two) times daily.      Marland Kitchen atorvastatin (LIPITOR) 40 MG tablet Take 40 mg by mouth daily.      . colchicine 0.6 MG tablet Take 0.6 mg by mouth 2 (two) times daily.       Marland Kitchen ELIQUIS 5 MG TABS tablet TAKE 1 TABLET BY MOUTH TWICE A DAY  180 tablet  0  . furosemide (LASIX) 80 MG tablet Take 80 mg by mouth 2 (two) times daily.       . hydrALAZINE (APRESOLINE) 25 MG tablet Take 1.5 tablets (37.5 mg total) by mouth 3 (three) times daily.  120 tablet  9  . hydrALAZINE (APRESOLINE) 25 MG tablet Take 1.5 tablets (37.5 mg total) by mouth 3 (three) times daily.  120 tablet  3  . insulin glulisine (APIDRA) 100 UNIT/ML injection Inject 25 Units into the skin daily.      Marland Kitchen LANTUS SOLOSTAR 100 UNIT/ML injection Inject 50 Units into the skin daily. Sliding scale      . metoprolol (LOPRESSOR) 100 MG tablet TAKE 1 TABLET BY MOUTH 2 TIMES DAILY.  180 tablet  0  . Multiple Vitamin (MULTIVITAMIN) tablet Take 1 tablet by mouth daily.      Marland Kitchen  OVER THE COUNTER MEDICATION Take 1 tablet by mouth daily. For joints      . HYDROcodone-acetaminophen (NORCO) 7.5-325 MG per tablet Take 0.5 tablets by mouth daily as needed for moderate pain.       Marland Kitchen torsemide (DEMADEX) 20 MG tablet Take 4 tablets (80 mg total) by mouth 2 (two) times daily.  240 tablet  6   No current facility-administered medications for this visit.    Review of Systems  Constitutional: Negative for chills and fever.  Eyes: Negative for loss of vision.  Respiratory: Negative for cough and wheezing.  Cardiovascular: Negative for chest pain, chest tightness, claudication, dyspnea with exertion, orthopnea and palpitations.  GI: Negative for blood in stool and vomiting.  GU: Negative for dysuria and hematuria.  Musculoskeletal: Negative for leg pain, joint pain and myalgias.  Skin: Negative for rash and wound.  Neurological: Negative for dizziness and speech difficulty.  Hematologic: Negative for bruises/bleeds  easily. Psychiatric: Negative for depressed mood.        Objective:  Objective  Filed Vitals:   01/31/14 0951  BP: 134/76  Pulse: 91  Height: 6' (1.829 m)  Weight: 208 lb (94.348 kg)  SpO2: 93%   Body mass index is 28.2 kg/(m^2).  Physical Exam  Constitutional: He is oriented to person, place, and time. He appears well-developed and well-nourished.  HENT:  Head: Normocephalic and atraumatic.  Neck: Neck supple. No JVD present. No thyromegaly present.  Cardiovascular: Normal rate, regular rhythm and normal heart sounds.  Exam reveals no friction rub.   No murmur heard. Pulses:      Radial pulses are 2+ on the right side, and 2+ on the left side.  I do not detect carotid bruits.  Pulmonary/Chest: Breath sounds normal. He has no wheezes. He has no rales.  Abdominal: Soft. Bowel sounds are normal. There is no tenderness.  Musculoskeletal: Normal range of motion. He exhibits no edema.  Lymphadenopathy:    He has no cervical adenopathy.  Neurological: He is alert and oriented to person, place, and time. He has normal strength. No sensory deficit.  Skin: No lesion and no rash noted.  Psychiatric: He has a normal mood and affect.    Data: ARTERIAL DOPPLER: I have independently interpreted his arterial Doppler study which shows triphasic waveforms in the radial and ulnar positions bilaterally.  VEIN MAP: I have independently interpreted his vein mapping of both upper extremities. He does not appear to have an adequate cephalic vein or basilic vein in either arm for a fistula.  I have reviewed his records from [facility]. The patient has stage IV chronic kidney disease. On 12/26/2013 GFR was 10      Assessment/Plan:     Chronic kidney disease (CKD), stage IV (severe) Based on his vein mapping, he does not appear to be a candidate for a fistula. However, I explained I would look at his veins myself with ultrasound at the time of surgery. If at all possible  certainly we will place a fistula. However his veins are not adequate I would plan on placement of an AV graft given that his GFR is only 10. I have explained the indications for placement of an AV fistula or AV graft. I've explained that if at all possible we will place an AV fistula.  I have reviewed the risks of placement of an AV fistula including but not limited to: failure of the fistula to mature, need for subsequent interventions, and thrombosis. In addition I have  reviewed the potential complications of placement of an AV graft. These risks include, but are not limited to, graft thrombosis, graft infection, wound healing problems, bleeding, arm swelling, and steal syndrome. All the patient's questions were answered and they are agreeable to proceed with surgery. The surgery is scheduled for 02/13/2014. We will stop his Eliquis 3 days prior to surgery.      Angelia Mould MD [facility]

## 2014-02-14 ENCOUNTER — Encounter (HOSPITAL_COMMUNITY): Payer: Self-pay | Admitting: Vascular Surgery

## 2014-02-15 ENCOUNTER — Inpatient Hospital Stay (HOSPITAL_COMMUNITY): Admission: RE | Admit: 2014-02-15 | Payer: 59 | Source: Ambulatory Visit

## 2014-02-20 ENCOUNTER — Emergency Department (HOSPITAL_COMMUNITY): Payer: 59

## 2014-02-20 ENCOUNTER — Other Ambulatory Visit: Payer: Self-pay

## 2014-02-20 ENCOUNTER — Encounter (HOSPITAL_COMMUNITY): Payer: Self-pay

## 2014-02-20 ENCOUNTER — Encounter (HOSPITAL_COMMUNITY): Payer: Self-pay | Admitting: Emergency Medicine

## 2014-02-20 ENCOUNTER — Inpatient Hospital Stay (HOSPITAL_COMMUNITY)
Admission: EM | Admit: 2014-02-20 | Discharge: 2014-03-03 | DRG: 291 | Disposition: A | Discharge status: Home / Self Care | Payer: 59 | Attending: Internal Medicine | Admitting: Internal Medicine

## 2014-02-20 ENCOUNTER — Ambulatory Visit (HOSPITAL_BASED_OUTPATIENT_CLINIC_OR_DEPARTMENT_OTHER)
Admission: RE | Admit: 2014-02-20 | Discharge: 2014-02-20 | Disposition: A | Discharge status: Home / Self Care | Payer: 59 | Source: Ambulatory Visit | Attending: Internal Medicine | Admitting: Internal Medicine

## 2014-02-20 VITALS — BP 128/66 | HR 90 | Resp 18 | Wt 213.5 lb

## 2014-02-20 DIAGNOSIS — R0600 Dyspnea, unspecified: Secondary | ICD-10-CM

## 2014-02-20 DIAGNOSIS — E8889 Other specified metabolic disorders: Secondary | ICD-10-CM | POA: Diagnosis present

## 2014-02-20 DIAGNOSIS — I272 Other secondary pulmonary hypertension: Secondary | ICD-10-CM | POA: Diagnosis present

## 2014-02-20 DIAGNOSIS — J9600 Acute respiratory failure, unspecified whether with hypoxia or hypercapnia: Secondary | ICD-10-CM | POA: Insufficient documentation

## 2014-02-20 DIAGNOSIS — I5033 Acute on chronic diastolic (congestive) heart failure: Secondary | ICD-10-CM | POA: Insufficient documentation

## 2014-02-20 DIAGNOSIS — N179 Acute kidney failure, unspecified: Secondary | ICD-10-CM | POA: Diagnosis present

## 2014-02-20 DIAGNOSIS — E119 Type 2 diabetes mellitus without complications: Secondary | ICD-10-CM | POA: Insufficient documentation

## 2014-02-20 DIAGNOSIS — I482 Chronic atrial fibrillation: Secondary | ICD-10-CM | POA: Diagnosis present

## 2014-02-20 DIAGNOSIS — E876 Hypokalemia: Secondary | ICD-10-CM | POA: Diagnosis not present

## 2014-02-20 DIAGNOSIS — D631 Anemia in chronic kidney disease: Secondary | ICD-10-CM | POA: Diagnosis present

## 2014-02-20 DIAGNOSIS — R0602 Shortness of breath: Secondary | ICD-10-CM

## 2014-02-20 DIAGNOSIS — Z7901 Long term (current) use of anticoagulants: Secondary | ICD-10-CM | POA: Diagnosis not present

## 2014-02-20 DIAGNOSIS — E785 Hyperlipidemia, unspecified: Secondary | ICD-10-CM | POA: Diagnosis present

## 2014-02-20 DIAGNOSIS — E1121 Type 2 diabetes mellitus with diabetic nephropathy: Secondary | ICD-10-CM | POA: Diagnosis present

## 2014-02-20 DIAGNOSIS — I4892 Unspecified atrial flutter: Secondary | ICD-10-CM | POA: Diagnosis not present

## 2014-02-20 DIAGNOSIS — J9601 Acute respiratory failure with hypoxia: Secondary | ICD-10-CM

## 2014-02-20 DIAGNOSIS — N185 Chronic kidney disease, stage 5: Secondary | ICD-10-CM | POA: Diagnosis present

## 2014-02-20 DIAGNOSIS — E875 Hyperkalemia: Secondary | ICD-10-CM | POA: Diagnosis present

## 2014-02-20 DIAGNOSIS — N189 Chronic kidney disease, unspecified: Secondary | ICD-10-CM

## 2014-02-20 DIAGNOSIS — J9621 Acute and chronic respiratory failure with hypoxia: Secondary | ICD-10-CM | POA: Insufficient documentation

## 2014-02-20 DIAGNOSIS — E1142 Type 2 diabetes mellitus with diabetic polyneuropathy: Secondary | ICD-10-CM | POA: Diagnosis present

## 2014-02-20 DIAGNOSIS — I10 Essential (primary) hypertension: Secondary | ICD-10-CM | POA: Diagnosis present

## 2014-02-20 DIAGNOSIS — I120 Hypertensive chronic kidney disease with stage 5 chronic kidney disease or end stage renal disease: Secondary | ICD-10-CM | POA: Diagnosis present

## 2014-02-20 DIAGNOSIS — Z79899 Other long term (current) drug therapy: Secondary | ICD-10-CM | POA: Diagnosis not present

## 2014-02-20 DIAGNOSIS — I4891 Unspecified atrial fibrillation: Secondary | ICD-10-CM | POA: Diagnosis present

## 2014-02-20 DIAGNOSIS — G4733 Obstructive sleep apnea (adult) (pediatric): Secondary | ICD-10-CM | POA: Diagnosis present

## 2014-02-20 DIAGNOSIS — M109 Gout, unspecified: Secondary | ICD-10-CM | POA: Diagnosis not present

## 2014-02-20 DIAGNOSIS — Z87891 Personal history of nicotine dependence: Secondary | ICD-10-CM

## 2014-02-20 DIAGNOSIS — E1122 Type 2 diabetes mellitus with diabetic chronic kidney disease: Secondary | ICD-10-CM | POA: Diagnosis present

## 2014-02-20 DIAGNOSIS — Z794 Long term (current) use of insulin: Secondary | ICD-10-CM | POA: Diagnosis not present

## 2014-02-20 DIAGNOSIS — Z9981 Dependence on supplemental oxygen: Secondary | ICD-10-CM

## 2014-02-20 DIAGNOSIS — N183 Chronic kidney disease, stage 3 unspecified: Secondary | ICD-10-CM | POA: Insufficient documentation

## 2014-02-20 DIAGNOSIS — I509 Heart failure, unspecified: Secondary | ICD-10-CM | POA: Insufficient documentation

## 2014-02-20 DIAGNOSIS — N184 Chronic kidney disease, stage 4 (severe): Secondary | ICD-10-CM

## 2014-02-20 DIAGNOSIS — E1021 Type 1 diabetes mellitus with diabetic nephropathy: Secondary | ICD-10-CM

## 2014-02-20 LAB — BASIC METABOLIC PANEL
ANION GAP: 18 — AB (ref 5–15)
BUN: 86 mg/dL — ABNORMAL HIGH (ref 6–23)
CALCIUM: 9 mg/dL (ref 8.4–10.5)
CO2: 21 mEq/L (ref 19–32)
CREATININE: 3.98 mg/dL — AB (ref 0.50–1.35)
Chloride: 103 mEq/L (ref 96–112)
GFR, EST AFRICAN AMERICAN: 18 mL/min — AB (ref >90)
GFR, EST NON AFRICAN AMERICAN: 15 mL/min — AB (ref >90)
Glucose, Bld: 124 mg/dL — ABNORMAL HIGH (ref 70–99)
Potassium: 4.1 mEq/L (ref 3.7–5.3)
SODIUM: 142 meq/L (ref 137–147)

## 2014-02-20 LAB — CBC WITH DIFFERENTIAL/PLATELET
BASOS ABS: 0 10*3/uL (ref 0.0–0.1)
BASOS PCT: 0 % (ref 0–1)
EOS ABS: 0.2 10*3/uL (ref 0.0–0.7)
EOS PCT: 3 % (ref 0–5)
HEMATOCRIT: 29.4 % — AB (ref 39.0–52.0)
Hemoglobin: 9.2 g/dL — ABNORMAL LOW (ref 13.0–17.0)
Lymphocytes Relative: 12 % (ref 12–46)
Lymphs Abs: 1.2 10*3/uL (ref 0.7–4.0)
MCH: 27.4 pg (ref 26.0–34.0)
MCHC: 31.3 g/dL (ref 30.0–36.0)
MCV: 87.5 fL (ref 78.0–100.0)
MONO ABS: 0.7 10*3/uL (ref 0.1–1.0)
Monocytes Relative: 8 % (ref 3–12)
Neutro Abs: 7.2 10*3/uL (ref 1.7–7.7)
Neutrophils Relative %: 77 % (ref 43–77)
PLATELETS: 296 10*3/uL (ref 150–400)
RBC: 3.36 MIL/uL — ABNORMAL LOW (ref 4.22–5.81)
RDW: 19.4 % — AB (ref 11.5–15.5)
WBC: 9.3 10*3/uL (ref 4.0–10.5)

## 2014-02-20 LAB — PRO B NATRIURETIC PEPTIDE: PRO B NATRI PEPTIDE: 15468 pg/mL — AB (ref 0–125)

## 2014-02-20 LAB — CBG MONITORING, ED: Glucose-Capillary: 127 mg/dL — ABNORMAL HIGH (ref 70–99)

## 2014-02-20 LAB — I-STAT TROPONIN, ED: Troponin i, poc: 0.03 ng/mL (ref 0.00–0.08)

## 2014-02-20 MED ORDER — ONDANSETRON HCL 4 MG/2ML IJ SOLN
4.0000 mg | Freq: Four times a day (QID) | INTRAMUSCULAR | Status: DC | PRN
  Administered 2014-02-25 – 2014-03-02 (×3): 4 mg via INTRAVENOUS
  Filled 2014-02-20 (×4): qty 2

## 2014-02-20 MED ORDER — HYDRALAZINE HCL 25 MG PO TABS
37.5000 mg | ORAL_TABLET | Freq: Three times a day (TID) | ORAL | Status: DC
  Administered 2014-02-21 – 2014-02-22 (×6): 37.5 mg via ORAL
  Filled 2014-02-20 (×9): qty 1.5

## 2014-02-20 MED ORDER — ACETAMINOPHEN 650 MG RE SUPP: 650.0000 mg | Freq: Four times a day (QID) | RECTAL | Status: DC | PRN

## 2014-02-20 MED ORDER — METOPROLOL TARTRATE 100 MG PO TABS
100.0000 mg | ORAL_TABLET | Freq: Two times a day (BID) | ORAL | Status: DC
  Administered 2014-02-20 – 2014-03-03 (×22): 100 mg via ORAL
  Filled 2014-02-20 (×5): qty 1
  Filled 2014-02-20: qty 4
  Filled 2014-02-20 (×17): qty 1

## 2014-02-20 MED ORDER — SODIUM CHLORIDE 0.9 % IJ SOLN
3.0000 mL | Freq: Two times a day (BID) | INTRAMUSCULAR | Status: DC
  Administered 2014-02-20 – 2014-03-03 (×13): 3 mL via INTRAVENOUS
  Filled 2014-02-20: qty 3

## 2014-02-20 MED ORDER — IPRATROPIUM BROMIDE 0.02 % IN SOLN
0.5000 mg | Freq: Four times a day (QID) | RESPIRATORY_TRACT | Status: DC
  Administered 2014-02-20 – 2014-02-21 (×4): 0.5 mg via RESPIRATORY_TRACT
  Filled 2014-02-20 (×4): qty 2.5

## 2014-02-20 MED ORDER — AMLODIPINE BESYLATE 10 MG PO TABS
10.0000 mg | ORAL_TABLET | Freq: Two times a day (BID) | ORAL | Status: DC
  Administered 2014-02-20 – 2014-02-22 (×4): 10 mg via ORAL
  Filled 2014-02-20 (×3): qty 1
  Filled 2014-02-20: qty 2
  Filled 2014-02-20: qty 1

## 2014-02-20 MED ORDER — INSULIN ASPART 100 UNIT/ML ~~LOC~~ SOLN
0.0000 [IU] | Freq: Three times a day (TID) | SUBCUTANEOUS | Status: DC
  Administered 2014-02-21 – 2014-02-22 (×2): 3 [IU] via SUBCUTANEOUS
  Administered 2014-02-22: 2 [IU] via SUBCUTANEOUS
  Administered 2014-02-23: 3 [IU] via SUBCUTANEOUS
  Administered 2014-02-23 – 2014-02-24 (×3): 5 [IU] via SUBCUTANEOUS
  Administered 2014-02-25: 3 [IU] via SUBCUTANEOUS
  Administered 2014-02-25: 2 [IU] via SUBCUTANEOUS
  Administered 2014-02-26: 3 [IU] via SUBCUTANEOUS
  Administered 2014-02-26: 5 [IU] via SUBCUTANEOUS
  Administered 2014-02-26 – 2014-02-27 (×2): 3 [IU] via SUBCUTANEOUS
  Administered 2014-02-27: 5 [IU] via SUBCUTANEOUS
  Administered 2014-02-27: 8 [IU] via SUBCUTANEOUS
  Administered 2014-02-28: 3 [IU] via SUBCUTANEOUS
  Administered 2014-02-28 (×2): 5 [IU] via SUBCUTANEOUS
  Administered 2014-02-28 – 2014-03-01 (×2): 3 [IU] via SUBCUTANEOUS
  Administered 2014-03-01: 2 [IU] via SUBCUTANEOUS
  Administered 2014-03-02: 5 [IU] via SUBCUTANEOUS
  Administered 2014-03-02 (×2): 2 [IU] via SUBCUTANEOUS
  Administered 2014-03-03 (×2): 3 [IU] via SUBCUTANEOUS

## 2014-02-20 MED ORDER — SENNA 8.6 MG PO TABS
1.0000 | ORAL_TABLET | Freq: Two times a day (BID) | ORAL | Status: DC
  Administered 2014-02-21 – 2014-03-03 (×21): 8.6 mg via ORAL
  Filled 2014-02-20 (×22): qty 1

## 2014-02-20 MED ORDER — HYDROCODONE-ACETAMINOPHEN 7.5-325 MG PO TABS
0.5000 | ORAL_TABLET | Freq: Every day | ORAL | Status: DC | PRN
  Administered 2014-02-25 – 2014-03-02 (×4): 0.5 via ORAL
  Filled 2014-02-20 (×4): qty 1

## 2014-02-20 MED ORDER — ACETAMINOPHEN 325 MG PO TABS: 650.0000 mg | ORAL_TABLET | Freq: Four times a day (QID) | ORAL | Status: DC | PRN

## 2014-02-20 MED ORDER — FUROSEMIDE 10 MG/ML IJ SOLN
120.0000 mg | Freq: Three times a day (TID) | INTRAVENOUS | Status: DC
  Administered 2014-02-21 – 2014-02-23 (×7): 120 mg via INTRAVENOUS
  Filled 2014-02-20 (×10): qty 12

## 2014-02-20 MED ORDER — ONDANSETRON HCL 4 MG PO TABS: 4.0000 mg | ORAL_TABLET | Freq: Four times a day (QID) | ORAL | Status: DC | PRN

## 2014-02-20 MED ORDER — FUROSEMIDE 10 MG/ML IJ SOLN: 60.0000 mg | Freq: Once | INTRAMUSCULAR | Status: DC

## 2014-02-20 MED ORDER — NITROGLYCERIN 2 % TD OINT
0.5000 [in_us] | TOPICAL_OINTMENT | Freq: Once | TRANSDERMAL | Status: AC
  Administered 2014-02-20: 0.5 [in_us] via TOPICAL
  Filled 2014-02-20: qty 1

## 2014-02-20 MED ORDER — ADULT MULTIVITAMIN W/MINERALS CH
ORAL_TABLET | Freq: Every day | ORAL | Status: DC
Start: 2014-02-20 — End: 2014-03-03
  Administered 2014-02-21 – 2014-03-03 (×11): 1 via ORAL
  Filled 2014-02-20 (×11): qty 1

## 2014-02-20 MED ORDER — ALBUTEROL SULFATE (2.5 MG/3ML) 0.083% IN NEBU: 2.5000 mg | INHALATION_SOLUTION | RESPIRATORY_TRACT | Status: DC | PRN

## 2014-02-20 MED ORDER — FUROSEMIDE 10 MG/ML IJ SOLN
120.0000 mg | Freq: Two times a day (BID) | INTRAVENOUS | Status: DC
  Filled 2014-02-20: qty 12

## 2014-02-20 MED ORDER — DOCUSATE SODIUM 100 MG PO CAPS
100.0000 mg | ORAL_CAPSULE | Freq: Two times a day (BID) | ORAL | Status: DC
  Administered 2014-02-20 – 2014-03-03 (×22): 100 mg via ORAL
  Filled 2014-02-20 (×23): qty 1

## 2014-02-20 MED ORDER — APIXABAN 5 MG PO TABS
5.0000 mg | ORAL_TABLET | Freq: Two times a day (BID) | ORAL | Status: DC
  Administered 2014-02-20 – 2014-02-26 (×12): 5 mg via ORAL
  Filled 2014-02-20 (×13): qty 1

## 2014-02-20 MED ORDER — ATORVASTATIN CALCIUM 40 MG PO TABS
40.0000 mg | ORAL_TABLET | Freq: Every day | ORAL | Status: DC
  Administered 2014-02-21 – 2014-03-03 (×11): 40 mg via ORAL
  Filled 2014-02-20 (×11): qty 1

## 2014-02-20 MED ORDER — INSULIN GLARGINE 100 UNIT/ML SOLOSTAR PEN: 35.0000 [IU] | PEN_INJECTOR | Freq: Every day | SUBCUTANEOUS | Status: DC

## 2014-02-20 MED ORDER — COLCHICINE 0.6 MG PO TABS
0.6000 mg | ORAL_TABLET | Freq: Two times a day (BID) | ORAL | Status: DC
  Administered 2014-02-20 – 2014-02-21 (×2): 0.6 mg via ORAL
  Filled 2014-02-20 (×3): qty 1

## 2014-02-20 MED ORDER — FUROSEMIDE 10 MG/ML IJ SOLN: 120.0000 mg | Freq: Three times a day (TID) | INTRAVENOUS | Status: DC

## 2014-02-20 MED ORDER — FUROSEMIDE 10 MG/ML IJ SOLN
100.0000 mg | Freq: Once | INTRAVENOUS | Status: AC
  Administered 2014-02-20: 100 mg via INTRAVENOUS
  Filled 2014-02-20 (×2): qty 10

## 2014-02-20 MED ORDER — ALBUTEROL SULFATE (2.5 MG/3ML) 0.083% IN NEBU
2.5000 mg | INHALATION_SOLUTION | Freq: Four times a day (QID) | RESPIRATORY_TRACT | Status: DC
  Administered 2014-02-20 – 2014-02-21 (×4): 2.5 mg via RESPIRATORY_TRACT
  Filled 2014-02-20 (×4): qty 3

## 2014-02-20 NOTE — H&P (Signed)
Patient Demographics  Joseph [name], is a 58 y.o. male  MRN: [id number]   DOB - Jun 05, 1955  Admit Date - 02/20/2014  Outpatient Primary MD for the patient is Patricia Nettle, MD   With History of -  Past Medical History  Diagnosis Date  . Atrial fibrillation     a. 11/18/2011 s/p DCCV - 150J;  b. Anticoagulation w/ Apixaban;  c. 11/2011 back in afib->asymptomatic.  . H/O alcohol abuse     a. drinks heavily on the weekends.  . Hypertension   . Diabetes mellitus   . Heart murmur     a. 09/2011 Echo: EF 55-60%, Triv AI, Mild MR, mildly dil LA.  . Diabetic peripheral neuropathy   . CKD (chronic kidney disease), stage III   . CHF (congestive heart failure)   . Pneumonia       Past Surgical History  Procedure Laterality Date  . Eye sx  11/11/2011    left eye  . Cardioversion  11/18/2011    Procedure: CARDIOVERSION;  Surgeon: Josue Hector, MD;  Location: [facility] ENDOSCOPY;  Service: Cardiovascular;  Laterality: N/A;  . Colonoscopy w/ biopsies and polypectomy    . Av fistula placement Left 02/13/2014    Procedure: INSERTION OF ARTERIOVENOUS (AV) GORE-TEX GRAFT ARM;  Surgeon: Angelia Mould, MD;  Location: [place];  Service: Vascular;  Laterality: Left;    in for   Chief Complaint  Patient presents with  . Shortness of Breath     HPI  Joseph [name]  is a 59 y.o. male, 58 yo with history of CKD, chronic atrial fibrillation, and chronic diastolic CHF , has been in atrial fibrillation since 6/13 when he failed a cardioversion and it was decided to leave him in atrial fibrillation. He has been on Eliquis. Most recent echo in 4/15 showed EF 95-63% with PA systolic pressure 85 mmHg,as well patient is being followed by Dr. Florene Glen from nephrology as he is known to have chronic kidney disease with a creatinine around 4, presents today to CHF clinic with volume overload picture, increased O2 demand, and significant dyspnea, so patient was transferred to ED from CHF clinic for further  management of his CHF. Patient has increase in O2 demand from 2 at baseline to 4 L, had 5 pounds weight gain over the last week, patient had AV graft placed last week in anticipation of need for hemodialysis. Patient labs show a significantly elevated BNP, chest x-ray showing pulmonary edema picture, reports worsening lower extremity edema, reports significant orthopnea. Patient received 80 mg of IV Lasix in between [facility] office, and another 100 mg of IV Lasix in ED.   Review of Systems    In addition to the HPI above,  No Fever-chills, No Headache, No changes with Vision or hearing, No problems swallowing food or Liquids, No Chest pain, complaints of Cough and Shortness of Breath, No Abdominal pain, No Nausea or Vommitting, Bowel movements are regular, No Blood in stool or Urine, No dysuria, No new skin rashes or bruises, No new joints pains-aches,ports worsening lower extremity edema No new weakness, tingling, numbness in any extremity, No recent weight gain or loss, No polyuria, polydypsia or polyphagia, No significant Mental Stressors.  A full 10 point Review of Systems was done, except as stated above, all other Review of Systems were negative.   Social History History  Substance Use Topics  . Smoking status: Former Smoker -- 0.01 packs/day for 5 years    Types: Cigarettes  Quit date: 04/20/1994  . Smokeless tobacco: Never Used     Comment: some in college  . Alcohol Use: No     Family History Family History  Problem Relation Age of Onset  . Heart disease Mother     before age 32  . Diabetes Father   . Diabetes Sister   . Peripheral vascular disease Sister     amputation     Prior to Admission medications   Medication Sig Start Date End Date Taking? Authorizing Provider  amLODipine (NORVASC) 10 MG tablet Take 10 mg by mouth 2 (two) times daily.   Yes Historical Provider, MD  apixaban (ELIQUIS) 5 MG TABS tablet Take 5 mg by mouth 2 (two) times daily.   Yes  Historical Provider, MD  atorvastatin (LIPITOR) 40 MG tablet Take 40 mg by mouth daily.   Yes Historical Provider, MD  colchicine 0.6 MG tablet Take 0.6 mg by mouth 2 (two) times daily.  07/12/13  Yes Historical Provider, MD  furosemide (LASIX) 80 MG tablet Take 160 mg by mouth 2 (two) times daily.  11/13/13  Yes Historical Provider, MD  hydrALAZINE (APRESOLINE) 25 MG tablet Take 1.5 tablets (37.5 mg total) by mouth 3 (three) times daily. 01/29/14  Yes Jolaine Artist, MD  HYDROcodone-acetaminophen (NORCO) 7.5-325 MG per tablet Take 0.5 tablets by mouth daily as needed for moderate pain. 02/13/14  Yes Kimberly A Trinh, PA-C  LANTUS SOLOSTAR 100 UNIT/ML injection Inject 50 Units into the skin daily. Sliding scale 09/19/11  Yes Historical Provider, MD  metoprolol (LOPRESSOR) 100 MG tablet Take 100 mg by mouth 2 (two) times daily.   Yes Historical Provider, MD  Multiple Vitamin (MULTIVITAMIN) tablet Take 1 tablet by mouth daily.   Yes Historical Provider, MD  OVER THE COUNTER MEDICATION Take 1 tablet by mouth daily. For joints   Yes Historical Provider, MD  insulin glulisine (APIDRA) 100 UNIT/ML injection Inject 25 Units into the skin daily.    Historical Provider, MD    No Known Allergies  Physical Exam  Vitals  Blood pressure 148/87, pulse 44, resp. rate 19, SpO2 97 %.   1. General well-nourished male lying in bed in  mild respiratory distress, but able to speak full sentences  2. Normal affect and insight, Not Suicidal or Homicidal, Awake Alert, Oriented X 3.  3. No F.N deficits, ALL C.Nerves Intact, Strength 5/5 all 4 extremities, Sensation intact all 4 extremities, Plantars down going.  4. Ears and Eyes appear Normal, Conjunctivae clear, PERRLA. dry Oral Mucosa.  5. Supple Neck, JVD +8 cm, No cervical lymphadenopathy appriciated, No Carotid Bruits.  6. Symmetrical Chest wall movement, Good air movement bilaterally, bibasilar crackles.  7.  No Gallops, Rubs or Murmurs, No  Parasternal Heave.  8. Positive Bowel Sounds, Abdomen Soft, No tenderness, No organomegaly appriciated,No rebound -guarding or rigidity.  9.  No Cyanosis, Normal Skin Turgor, No Skin Rash or Bruise.  10. Good muscle tone,  joints appear normal , no effusions, Normal ROM.+3/4 edema bilaterally  11. No Palpable Lymph Nodes in Neck or Axillae    Data Review  CBC  Recent Labs Lab 02/20/14 1515  WBC 9.3  HGB 9.2*  HCT 29.4*  PLT 296  MCV 87.5  MCH 27.4  MCHC 31.3  RDW 19.4*  LYMPHSABS 1.2  MONOABS 0.7  EOSABS 0.2  BASOSABS 0.0   ------------------------------------------------------------------------------------------------------------------  Chemistries   Recent Labs Lab 02/20/14 1515  NA 142  K 4.1  CL 103  CO2 21  GLUCOSE 124*  BUN 86*  CREATININE 3.98*  CALCIUM 9.0   ------------------------------------------------------------------------------------------------------------------ estimated creatinine clearance is 24.4 mL/min (by C-G formula based on Cr of 3.98). ------------------------------------------------------------------------------------------------------------------ No results for input(s): TSH, T4TOTAL, T3FREE, THYROIDAB in the last 72 hours.  Invalid input(s): FREET3   Coagulation profile No results for input(s): INR, PROTIME in the last 168 hours. ------------------------------------------------------------------------------------------------------------------- No results for input(s): DDIMER in the last 72 hours. -------------------------------------------------------------------------------------------------------------------  Cardiac Enzymes No results for input(s): CKMB, TROPONINI, MYOGLOBIN in the last 168 hours.  Invalid input(s): CK ------------------------------------------------------------------------------------------------------------------ Invalid input(s):  POCBNP   ---------------------------------------------------------------------------------------------------------------  Urinalysis No results found for: COLORURINE, APPEARANCEUR, LABSPEC, PHURINE, GLUCOSEU, HGBUR, BILIRUBINUR, KETONESUR, PROTEINUR, UROBILINOGEN, NITRITE, LEUKOCYTESUR  ----------------------------------------------------------------------------------------------------------------  Imaging results:   Dg Chest 2 View  02/20/2014   CLINICAL DATA:  Shortness of breath and cough for 8 days.  EXAM: CHEST  2 VIEW  COMPARISON:  02/13/2014  FINDINGS: Stable borderline cardiomegaly.  Negative aortic contours and hila.  Diffuse interstitial opacity persists, with Kerley lines. No significant pleural effusion. There is a newly seen focal opacity in the right upper lung which is asymmetric.  IMPRESSION: 1. Persistent pulmonary edema. 2. Focal opacity in the right upper lung may reflect asymmetry alveolar edema, but pneumonia cannot be excluded.   Electronically Signed   By: Jorje Guild M.D.   On: 02/20/2014 16:00    My personal review of EKG: Rhythm a flutter, Rate  90 beats/min,     Assessment & Plan  Active Problems:   Atrial fibrillation-permanent   Diabetes mellitus   Hypertension   Hyperlipidemia   Obstructive sleep apnea   CKD (chronic kidney disease), stage III   Acute respiratory failure   Acute on chronic diastolic congestive heart failure    Acute on chronic diastolic CHF -Most recent echo in April showing EF 02%, with diastolic dysfunction, patient is on 160 mg oral Lasix 2 times a day, and despite that he appears to be volume overloaded. - Will admit to stepdown unit, start on IV Lasix 120 mg oral 2 times daily,daily weights, strict ins and outs, consult cardiology.  Acute on chronic respiratory failure -this is secondary to volume overload due to his diastolic CHF, and possibly progression of his renal disease, what it seems he might need hemodialysis  soon. -Patient has increased demand on his O2 requirement,will start on BiPAP.  Chronic kidney disease -remains around baseline with a creatinine of 4. -Has AV graft placed last week -We'll consult nephrology in a.m.  Atrial fibrillation -Rate controlled, with amiodarone and metoprolol -Continue with the Eliquis for anticoagulation  Diabetes mellitus -We'll start patient on insulin sliding scale, will lower his Lantus dose from 50 subcutaneous daily to 35 subcutaneous daily, and add insulin sliding scale  Hyperlipidemia -Continue with statin  DVT Prophylaxis on Eliquis, SCDs  AM Labs Ordered, also please review Full Orders  Family Communication: Admission, patients condition and plan of care including tests being ordered have been discussed with the patient and wife at bedside who indicate understanding and agree with the plan and Code Status.  Code Status Full  Disposition plan; admit to stepdown  Condition GUARDED    Time spent in minutes : 65 minutes.    [name], Joseph Urwin M.D on 02/20/2014 at 5:27 PM  Between 7am to 7pm - Pager - [phone]  After 7pm go to [email or web address] - password [email or web address]  And look for the night coverage person covering me after hours  [facility] Office  [phone]   **Disclaimer: This  note may have been dictated with voice recognition software. Similar sounding words can inadvertently be transcribed and this note may contain transcription errors which may not have been corrected upon publication of note.**

## 2014-02-20 NOTE — ED Notes (Signed)
Pt went to a dr app today and was  So sob pt is normally on o2 at 2 lnc but had not worn it to dr , pt just had dialysis graft placed last week was placed on 2 ncl fpr 02 sats in the 70s then bumped up to 4 when he only came up to the 85 pt sob  whjen he btalks and when he exerts himself

## 2014-02-20 NOTE — ED Notes (Signed)
PT placed on regular bed.

## 2014-02-20 NOTE — ED Notes (Signed)
*  Past due med order of multivitamin, atorvastatin, and others still unverified.

## 2014-02-20 NOTE — Consult Note (Signed)
58 yo diabetic male with a history of stage 4 CKD prob due to diabetes mellitus, s/p AVF LUE 10/27, chronic atrial fibrillation Rx apixiban, and chronic diastolic CHF.He is admitted on this occasion with CHF.  He reports medication compliance but it was Homecoming Weekend" so perhaps there was some dietary indiscretion.  He has had some challenges recently with fluid overload.  Echo in April reportedly showed a 55-60% EF. Creatinines noted below.   Results for MATHEWS, STUHR (MRN [id number]) as of 02/20/2014 20:07  Ref. Range 10/11/2013 08:28 10/17/2013 10:27 10/30/2013 13:28 01/15/2014 12:21 02/20/2014 15:15  Creatinine Latest Range: 0.50-1.35 mg/dL 4.73 (H) 4.88 (H) 4.25 (H) 5.57 (H) 3.98 (H)   Past Medical History  Diagnosis Date  . Atrial fibrillation     a. 11/18/2011 s/p DCCV - 150J;  b. Anticoagulation w/ Apixaban;  c. 11/2011 back in afib->asymptomatic.  . H/O alcohol abuse     a. drinks heavily on the weekends.  . Hypertension   . Diabetes mellitus   . Heart murmur     a. 09/2011 Echo: EF 55-60%, Triv AI, Mild MR, mildly dil LA.  . Diabetic peripheral neuropathy   . CKD (chronic kidney disease), stage III   . CHF (congestive heart failure)   . Pneumonia    Past Surgical History  Procedure Laterality Date  . Eye sx  11/11/2011    left eye  . Cardioversion  11/18/2011    Procedure: CARDIOVERSION;  Surgeon: Josue Hector, MD;  Location: [facility] ENDOSCOPY;  Service: Cardiovascular;  Laterality: N/A;  . Colonoscopy w/ biopsies and polypectomy    . Av fistula placement Left 02/13/2014    Procedure: INSERTION OF ARTERIOVENOUS (AV) GORE-TEX GRAFT ARM;  Surgeon: Angelia Mould, MD;  Location: [place];  Service: Vascular;  Laterality: Left;   Social History:  reports that he quit smoking about 19 years ago. His smoking use included Cigarettes. He has a .05 pack-year smoking history. He has never used smokeless tobacco. He reports that he does not drink alcohol or use illicit drugs. Allergies: No  Known Allergies Family History  Problem Relation Age of Onset  . Heart disease Mother     before age 55  . Diabetes Father   . Diabetes Sister   . Peripheral vascular disease Sister     amputation    Medications:  Prior to Admission:  (Not in a hospital admission) Scheduled: . albuterol  2.5 mg Nebulization Q6H  . amLODipine  10 mg Oral BID  . apixaban  5 mg Oral BID  . atorvastatin  40 mg Oral Daily  . colchicine  0.6 mg Oral BID  . docusate sodium  100 mg Oral BID  . hydrALAZINE  37.5 mg Oral TID  . [START ON 02/21/2014] insulin aspart  0-15 Units Subcutaneous TID WC  . [START ON 02/21/2014] Insulin Glargine  35 Units Subcutaneous QAC breakfast  . ipratropium  0.5 mg Nebulization Q6H  . metoprolol  100 mg Oral BID  . multivitamin  1 tablet Oral Daily  . senna  1 tablet Oral BID  . sodium chloride  3 mL Intravenous Q12H   ROS: unable to obtain while he is wearing the BiPAP  Blood pressure 139/75, pulse 75, resp. rate 16, SpO2 99 %.  General appearance: alert and cooperative Head: Normocephalic, without obvious abnormality, atraumatic, wearing BiPAP Eyes: negative Ears: normal  Throat: cannot examine on BiPAP Resp: diminished breath sounds bilaterally Chest wall: no tenderness Cardio: regular rate and rhythm, S1,  S2 normal, no murmur, click, rub or gallop GI: soft, non-tender; bowel sounds normal; no masses,  no organomegaly Extremities: edema 1-2+ pretibial LUE swollen s/p antecubital fossa surgey for AVF Skin: Skin color, texture, turgor normal. No rashes or lesions Neurologic: Grossly normal Results for orders placed or performed during the hospital encounter of 02/20/14 (from the past 48 hour(s))  CBC with Differential     Status: Abnormal   Collection Time: 02/20/14  3:15 PM  Result Value Ref Range   WBC 9.3 4.0 - 10.5 K/uL   RBC 3.36 (L) 4.22 - 5.81 MIL/uL   Hemoglobin 9.2 (L) 13.0 - 17.0 g/dL   HCT 29.4 (L) 39.0 - 52.0 %   MCV 87.5 78.0 - 100.0 fL   MCH 27.4  26.0 - 34.0 pg   MCHC 31.3 30.0 - 36.0 g/dL   RDW 19.4 (H) 11.5 - 15.5 %   Platelets 296 150 - 400 K/uL   Neutrophils Relative % 77 43 - 77 %   Neutro Abs 7.2 1.7 - 7.7 K/uL   Lymphocytes Relative 12 12 - 46 %   Lymphs Abs 1.2 0.7 - 4.0 K/uL   Monocytes Relative 8 3 - 12 %   Monocytes Absolute 0.7 0.1 - 1.0 K/uL   Eosinophils Relative 3 0 - 5 %   Eosinophils Absolute 0.2 0.0 - 0.7 K/uL   Basophils Relative 0 0 - 1 %   Basophils Absolute 0.0 0.0 - 0.1 K/uL  Basic metabolic panel     Status: Abnormal   Collection Time: 02/20/14  3:15 PM  Result Value Ref Range   Sodium 142 137 - 147 mEq/L   Potassium 4.1 3.7 - 5.3 mEq/L   Chloride 103 96 - 112 mEq/L   CO2 21 19 - 32 mEq/L   Glucose, Bld 124 (H) 70 - 99 mg/dL   BUN 86 (H) 6 - 23 mg/dL   Creatinine, Ser 3.98 (H) 0.50 - 1.35 mg/dL   Calcium 9.0 8.4 - 10.5 mg/dL   GFR calc non Af Amer 15 (L) >90 mL/min   GFR calc Af Amer 18 (L) >90 mL/min    Comment: (NOTE) The eGFR has been calculated using the CKD EPI equation. This calculation has not been validated in all clinical situations. eGFR's persistently <90 mL/min signify possible Chronic Kidney Disease.    Anion gap 18 (H) 5 - 15  Pro b natriuretic peptide     Status: Abnormal   Collection Time: 02/20/14  3:15 PM  Result Value Ref Range   Pro B Natriuretic peptide (BNP) 15468.0 (H) 0 - 125 pg/mL  I-stat troponin, ED     Status: None   Collection Time: 02/20/14  3:46 PM  Result Value Ref Range   Troponin i, poc 0.03 0.00 - 0.08 ng/mL   Comment 3            Comment: Due to the release kinetics of cTnI, a negative result within the first hours of the onset of symptoms does not rule out myocardial infarction with certainty. If myocardial infarction is still suspected, repeat the test at appropriate intervals.    Dg Chest 2 View  02/20/2014   CLINICAL DATA:  Shortness of breath and cough for 8 days.  EXAM: CHEST  2 VIEW  COMPARISON:  02/13/2014  FINDINGS: Stable borderline  cardiomegaly.  Negative aortic contours and hila.  Diffuse interstitial opacity persists, with Kerley lines. No significant pleural effusion. There is a newly seen focal opacity in the right  upper lung which is asymmetric.  IMPRESSION: 1. Persistent pulmonary edema. 2. Focal opacity in the right upper lung may reflect asymmetry alveolar edema, but pneumonia cannot be excluded.   Electronically Signed   By: Jorje Guild M.D.   On: 02/20/2014 16:00    Renal Assessment:  1 Stage 4/5 CKD S/P AVF LUE 10/27 2 CHF/Vol overload, r/o PNA  Rec: 1 Diurese with IV furosemide and keep on dry side then convert to PO loop diuretic.  We discussed need for home weight-diuretic sliding scale management 2 F/U RUL region of CXR 3 I suspect he will be able to avoid dialysis a little while longer with proper diet and medication adherence  Octavion Mollenkopf C 02/20/2014, 8:24 PM

## 2014-02-20 NOTE — ED Notes (Signed)
MD Walden at bedside 

## 2014-02-20 NOTE — ED Notes (Signed)
Pt regular bed arrived.

## 2014-02-20 NOTE — ED Provider Notes (Signed)
CSN: [id number]     Arrival date & time 02/20/14  1442 History   First MD Initiated Contact with Patient 02/20/14 1451     Chief Complaint  Patient presents with  . Shortness of Breath    HPI  Patient is a 58 y.o. Male who presents to the ED with worsening shortness of breath x 2 weeks.  Patient states that he just had a graft put in his left arm on Tuesday of last week.  Since that time he has had worsening shortness of breath.  Patient has a history of CHF, a fib, and HTN and was sent to the ED by his cardiologist who he saw in the office today.  Patient also notes worsening leg swelling, some PND, and minimal wheezing.  Patient states compliance with medications at home and is currently on high dose lasix.  Patient is currently on apixaban.    Past Medical History  Diagnosis Date  . Atrial fibrillation     a. 11/18/2011 s/p DCCV - 150J;  b. Anticoagulation w/ Apixaban;  c. 11/2011 back in afib->asymptomatic.  . H/O alcohol abuse     a. drinks heavily on the weekends.  . Hypertension   . Diabetes mellitus   . Heart murmur     a. 09/2011 Echo: EF 55-60%, Triv AI, Mild MR, mildly dil LA.  . Diabetic peripheral neuropathy   . CKD (chronic kidney disease), stage III   . CHF (congestive heart failure)   . Pneumonia    Past Surgical History  Procedure Laterality Date  . Eye sx  11/11/2011    left eye  . Cardioversion  11/18/2011    Procedure: CARDIOVERSION;  Surgeon: Josue Hector, MD;  Location: [facility] ENDOSCOPY;  Service: Cardiovascular;  Laterality: N/A;  . Colonoscopy w/ biopsies and polypectomy    . Av fistula placement Left 02/13/2014    Procedure: INSERTION OF ARTERIOVENOUS (AV) GORE-TEX GRAFT ARM;  Surgeon: Angelia Mould, MD;  Location: [facility] OR;  Service: Vascular;  Laterality: Left;   Family History  Problem Relation Age of Onset  . Heart disease Mother     before age 59  . Diabetes Father   . Diabetes Sister   . Peripheral vascular disease Sister     amputation    History  Substance Use Topics  . Smoking status: Former Smoker -- 0.01 packs/day for 5 years    Types: Cigarettes    Quit date: 04/20/1994  . Smokeless tobacco: Never Used     Comment: some in college  . Alcohol Use: No    Review of Systems  Constitutional: Positive for fatigue. Negative for fever and chills.  Respiratory: Positive for cough and shortness of breath. Negative for chest tightness.   Cardiovascular: Positive for leg swelling. Negative for chest pain and palpitations.  Gastrointestinal: Negative for nausea, vomiting, abdominal pain, diarrhea, constipation and blood in stool.  Genitourinary: Negative for dysuria, urgency, frequency, hematuria and difficulty urinating.  All other systems reviewed and are negative.     Allergies  Review of patient's allergies indicates no known allergies.  Home Medications   Prior to Admission medications   Medication Sig Start Date End Date Taking? Authorizing Provider  amLODipine (NORVASC) 10 MG tablet Take 10 mg by mouth 2 (two) times daily.   Yes Historical Provider, MD  apixaban (ELIQUIS) 5 MG TABS tablet Take 5 mg by mouth 2 (two) times daily.   Yes Historical Provider, MD  atorvastatin (LIPITOR) 40 MG tablet Take 40 mg  by mouth daily.   Yes Historical Provider, MD  colchicine 0.6 MG tablet Take 0.6 mg by mouth 2 (two) times daily.  07/12/13  Yes Historical Provider, MD  furosemide (LASIX) 80 MG tablet Take 160 mg by mouth 2 (two) times daily.  11/13/13  Yes Historical Provider, MD  hydrALAZINE (APRESOLINE) 25 MG tablet Take 1.5 tablets (37.5 mg total) by mouth 3 (three) times daily. 01/29/14  Yes Jolaine Artist, MD  HYDROcodone-acetaminophen (NORCO) 7.5-325 MG per tablet Take 0.5 tablets by mouth daily as needed for moderate pain. 02/13/14  Yes Kimberly A Trinh, PA-C  LANTUS SOLOSTAR 100 UNIT/ML injection Inject 50 Units into the skin daily. Sliding scale 09/19/11  Yes Historical Provider, MD  metoprolol (LOPRESSOR) 100 MG  tablet Take 100 mg by mouth 2 (two) times daily.   Yes Historical Provider, MD  Multiple Vitamin (MULTIVITAMIN) tablet Take 1 tablet by mouth daily.   Yes Historical Provider, MD  OVER THE COUNTER MEDICATION Take 1 tablet by mouth daily. For joints   Yes Historical Provider, MD  insulin glulisine (APIDRA) 100 UNIT/ML injection Inject 25 Units into the skin daily.    Historical Provider, MD   BP 135/73 mmHg  Pulse 49  Resp 17  SpO2 94% Physical Exam  Constitutional: He is oriented to person, place, and time. He appears well-developed and well-nourished. No distress.  HENT:  Head: Normocephalic and atraumatic.  Mouth/Throat: Oropharynx is clear and moist. No oropharyngeal exudate.  Eyes: Conjunctivae and EOM are normal. Pupils are equal, round, and reactive to light. No scleral icterus.  Neck: Normal range of motion. Neck supple. No JVD present. No thyromegaly present.  Cardiovascular: Normal rate, regular rhythm, normal heart sounds and intact distal pulses.  Exam reveals no gallop and no friction rub.   No murmur heard. Pulmonary/Chest: Breath sounds normal. Accessory muscle usage present. He is in respiratory distress. He has no decreased breath sounds. He has no wheezes. He has no rhonchi. He has no rales. He exhibits no tenderness.  Abdominal: Soft. Bowel sounds are normal. He exhibits no distension and no mass. There is no tenderness. There is no rebound and no guarding.  Musculoskeletal: Normal range of motion.  Lymphadenopathy:    He has no cervical adenopathy.  Neurological: He is alert and oriented to person, place, and time. He has normal strength. No cranial nerve deficit or sensory deficit. Coordination normal.  Skin: Skin is warm and dry. He is not diaphoretic.  Psychiatric: He has a normal mood and affect. His behavior is normal. Judgment and thought content normal.  Nursing note and vitals reviewed.   ED Course  Procedures (including critical care time) Labs Review Labs  Reviewed  CBC WITH DIFFERENTIAL - Abnormal; Notable for the following:    RBC 3.36 (*)    Hemoglobin 9.2 (*)    HCT 29.4 (*)    RDW 19.4 (*)    All other components within normal limits  BASIC METABOLIC PANEL - Abnormal; Notable for the following:    Glucose, Bld 124 (*)    BUN 86 (*)    Creatinine, Ser 3.98 (*)    GFR calc non Af Amer 15 (*)    GFR calc Af Amer 18 (*)    Anion gap 18 (*)    All other components within normal limits  PRO B NATRIURETIC PEPTIDE - Abnormal; Notable for the following:    Pro B Natriuretic peptide (BNP) 15468.0 (*)    All other components within normal limits  Randolm Idol, ED    Imaging Review Dg Chest 2 View  02/20/2014   CLINICAL DATA:  Shortness of breath and cough for 8 days.  EXAM: CHEST  2 VIEW  COMPARISON:  02/13/2014  FINDINGS: Stable borderline cardiomegaly.  Negative aortic contours and hila.  Diffuse interstitial opacity persists, with Kerley lines. No significant pleural effusion. There is a newly seen focal opacity in the right upper lung which is asymmetric.  IMPRESSION: 1. Persistent pulmonary edema. 2. Focal opacity in the right upper lung may reflect asymmetry alveolar edema, but pneumonia cannot be excluded.   Electronically Signed   By: Jorje Guild M.D.   On: 02/20/2014 16:00     EKG Interpretation None      MDM   Final diagnoses:  Shortness of breath  Acute decompensated heart failure  CKD (chronic kidney disease), unspecified stage   Patient is a 58 y.o male with decompensated heart failure with chronic kidney disease presents from the office with worsening shortness of breath.  Physical exam reveals a patient who is breathing comfortably on 4L O2 [place].  Istat troponin is negative.  CXR shows bilateral pulmonary edema with right upper lobe consolidation suspect given normal white count that this is likely alveolar edema over pneumonia.  CBC reveals chronic anemia which appears to be stable.  BNP is elevated.  Patient to be  admitted to medicine by Dr. Waldron Labs to the [facility] unit.   Cardiology will consult and will get in touch with renal.  Patient does not require bipap at this time but may require it if he has increasing work and 100 mg IV lasix provides no relief.  Patient was seen by Dr. Mingo Amber who agrees with the above workup and plan.      Cherylann Parr, PA-C 02/20/14 1741  Evelina Bucy, MD 02/21/14 2317

## 2014-02-20 NOTE — Consult Note (Signed)
CONSULTATION NOTE  Reason for Consult: Congestive heart failure, renal failure  Requesting Physician: Dr. Waldron Labs  Cardiologist: Dr. Haroldine Laws  HPI: This is a 58 y.o. male with a past medical history significant for CKD, chronic atrial fibrillation, and chronic diastolic CHF was referred to CHF clinic for evaluation of CHF and elevated PA pressure on echo. Patient's creatinine has been as high as 4.9, probably due to HTN and diabetes. He follows with Dr. Florene Glen. He has been in atrial fibrillation since 6/13 when he failed a cardioversion and it was decided to leave him in atrial fibrillation. He has been on Eliquis. Most recent echo in 4/15 showed EF 00-92% with PA systolic pressure 85 mmHg. He has had difficulty recently with volume overload.   RHC in 6/15 showing elevated right and left heart filling pressures with moderate pulmonary hypertension, primarily pulmonary venous hypertension.   Follow-up: Saw Amy Clegg here for increased dyspnea. Give 80 mg IV lasix in HF clinic--> 260 c urine out. She switched him to torsemide but Dr. Florene Glen said stick with lasix but increase dose to 120 bid. Weight up at home 5 pounds to 213. + marked SOB at rest. AV graft placed last week. + bloating/orthopnea. Increased cough over the last week - pink phlegm. No fevers/chills. In clinic O2 sats 71% on RA 83% on 2L  Seen in the office today by Dr. Haroldine Laws and felt to be in decompensated heart failure and progressive renal failure. Not responsive to higher dose diuretics.  Suspicion that he may to start dialysis sooner than later.  PMHx:  Past Medical History  Diagnosis Date  . Atrial fibrillation     a. 11/18/2011 s/p DCCV - 150J;  b. Anticoagulation w/ Apixaban;  c. 11/2011 back in afib->asymptomatic.  . H/O alcohol abuse     a. drinks heavily on the weekends.  . Hypertension   . Diabetes mellitus   . Heart murmur     a. 09/2011 Echo: EF 55-60%, Triv AI, Mild MR, mildly dil LA.  .  Diabetic peripheral neuropathy   . CKD (chronic kidney disease), stage III   . CHF (congestive heart failure)   . Pneumonia    Past Surgical History  Procedure Laterality Date  . Eye sx  11/11/2011    left eye  . Cardioversion  11/18/2011    Procedure: CARDIOVERSION;  Surgeon: Josue Hector, MD;  Location: [facility] ENDOSCOPY;  Service: Cardiovascular;  Laterality: N/A;  . Colonoscopy w/ biopsies and polypectomy    . Av fistula placement Left 02/13/2014    Procedure: INSERTION OF ARTERIOVENOUS (AV) GORE-TEX GRAFT ARM;  Surgeon: Angelia Mould, MD;  Location: [facility] OR;  Service: Vascular;  Laterality: Left;    FAMHx: Family History  Problem Relation Age of Onset  . Heart disease Mother     before age 50  . Diabetes Father   . Diabetes Sister   . Peripheral vascular disease Sister     amputation    SOCHx:  reports that he quit smoking about 19 years ago. His smoking use included Cigarettes. He has a .05 pack-year smoking history. He has never used smokeless tobacco. He reports that he does not drink alcohol or use illicit drugs.  ALLERGIES: No Known Allergies  ROS: A comprehensive review of systems was negative except for: Constitutional: positive for fatigue and weight gain Respiratory: positive for cough and dyspnea on exertion Cardiovascular: positive for lower extremity edema  HOME MEDICATIONS:   Medication List    ASK  your doctor about these medications        amLODipine 10 MG tablet  Commonly known as:  NORVASC  Take 10 mg by mouth 2 (two) times daily.     APIDRA 100 UNIT/ML injection  Generic drug:  insulin glulisine  Inject 25 Units into the skin daily.     atorvastatin 40 MG tablet  Commonly known as:  LIPITOR  Take 40 mg by mouth daily.     colchicine 0.6 MG tablet  Take 0.6 mg by mouth 2 (two) times daily.     ELIQUIS 5 MG Tabs tablet  Generic drug:  apixaban  Take 5 mg by mouth 2 (two) times daily.     furosemide 80 MG tablet  Commonly known as:   LASIX  Take 160 mg by mouth 2 (two) times daily.     hydrALAZINE 25 MG tablet  Commonly known as:  APRESOLINE  Take 1.5 tablets (37.5 mg total) by mouth 3 (three) times daily.     HYDROcodone-acetaminophen 7.5-325 MG per tablet  Commonly known as:  NORCO  Take 0.5 tablets by mouth daily as needed for moderate pain.     LANTUS SOLOSTAR 100 UNIT/ML Solostar Pen  Generic drug:  Insulin Glargine  Inject 50 Units into the skin daily. Sliding scale     metoprolol 100 MG tablet  Commonly known as:  LOPRESSOR  Take 100 mg by mouth 2 (two) times daily.     multivitamin tablet  Take 1 tablet by mouth daily.     OVER THE COUNTER MEDICATION  Take 1 tablet by mouth daily. For joints       HOSPITAL MEDICATIONS: None yet  VITALS: Blood pressure 148/87, pulse 95, resp. rate 17, SpO2 100 %.  PHYSICAL EXAM: General appearance: alert, moderate distress and moderately obese Neck: JVD - 7 cm above sternal notch, no carotid bruit, supple, symmetrical, trachea midline and thyroid not enlarged, symmetric, no tenderness/mass/nodules Lungs: diminished breath sounds bilaterally and rales bibasilar Heart: regularly irregular rhythm and S1, S2 normal Abdomen: soft, non-tender; bowel sounds normal; no masses,  no organomegaly and obese Extremities: extremities normal, atraumatic, no cyanosis or edema Pulses: 1+ pulses Skin: Skin color, texture, turgor normal. No rashes or lesions Neurologic: Mental status: Alert, oriented, thought content appropriate Psych: appropriately anxious  LABS: Results for orders placed or performed during the hospital encounter of 02/20/14 (from the past 48 hour(s))  CBC with Differential     Status: Abnormal   Collection Time: 02/20/14  3:15 PM  Result Value Ref Range   WBC 9.3 4.0 - 10.5 K/uL   RBC 3.36 (L) 4.22 - 5.81 MIL/uL   Hemoglobin 9.2 (L) 13.0 - 17.0 g/dL   HCT 29.4 (L) 39.0 - 52.0 %   MCV 87.5 78.0 - 100.0 fL   MCH 27.4 26.0 - 34.0 pg   MCHC 31.3 30.0 -  36.0 g/dL   RDW 19.4 (H) 11.5 - 15.5 %   Platelets 296 150 - 400 K/uL   Neutrophils Relative % 77 43 - 77 %   Neutro Abs 7.2 1.7 - 7.7 K/uL   Lymphocytes Relative 12 12 - 46 %   Lymphs Abs 1.2 0.7 - 4.0 K/uL   Monocytes Relative 8 3 - 12 %   Monocytes Absolute 0.7 0.1 - 1.0 K/uL   Eosinophils Relative 3 0 - 5 %   Eosinophils Absolute 0.2 0.0 - 0.7 K/uL   Basophils Relative 0 0 - 1 %   Basophils Absolute 0.0 0.0 -  0.1 K/uL  Basic metabolic panel     Status: Abnormal   Collection Time: 02/20/14  3:15 PM  Result Value Ref Range   Sodium 142 137 - 147 mEq/L   Potassium 4.1 3.7 - 5.3 mEq/L   Chloride 103 96 - 112 mEq/L   CO2 21 19 - 32 mEq/L   Glucose, Bld 124 (H) 70 - 99 mg/dL   BUN 86 (H) 6 - 23 mg/dL   Creatinine, Ser 3.98 (H) 0.50 - 1.35 mg/dL   Calcium 9.0 8.4 - 10.5 mg/dL   GFR calc non Af Amer 15 (L) >90 mL/min   GFR calc Af Amer 18 (L) >90 mL/min    Comment: (NOTE) The eGFR has been calculated using the CKD EPI equation. This calculation has not been validated in all clinical situations. eGFR's persistently <90 mL/min signify possible Chronic Kidney Disease.    Anion gap 18 (H) 5 - 15  Pro b natriuretic peptide     Status: Abnormal   Collection Time: 02/20/14  3:15 PM  Result Value Ref Range   Pro B Natriuretic peptide (BNP) 15468.0 (H) 0 - 125 pg/mL  I-stat troponin, ED     Status: None   Collection Time: 02/20/14  3:46 PM  Result Value Ref Range   Troponin i, poc 0.03 0.00 - 0.08 ng/mL   Comment 3            Comment: Due to the release kinetics of cTnI, a negative result within the first hours of the onset of symptoms does not rule out myocardial infarction with certainty. If myocardial infarction is still suspected, repeat the test at appropriate intervals.     IMAGING: Dg Chest 2 View  02/20/2014   CLINICAL DATA:  Shortness of breath and cough for 8 days.  EXAM: CHEST  2 VIEW  COMPARISON:  02/13/2014  FINDINGS: Stable borderline cardiomegaly.  Negative  aortic contours and hila.  Diffuse interstitial opacity persists, with Kerley lines. No significant pleural effusion. There is a newly seen focal opacity in the right upper lung which is asymmetric.  IMPRESSION: 1. Persistent pulmonary edema. 2. Focal opacity in the right upper lung may reflect asymmetry alveolar edema, but pneumonia cannot be excluded.   Electronically Signed   By: Jorje Guild M.D.   On: 02/20/2014 16:00    HOSPITAL DIAGNOSES: Principal Problem:   Acute on chronic diastolic congestive heart failure Active Problems:   Atrial fibrillation-permanent   Diabetes mellitus   Hypertension   Hyperlipidemia   Obstructive sleep apnea   Chronic kidney disease (CKD), stage IV (severe)   Acute respiratory failure   IMPRESSION: 1. Acute decompensated diastolic congestive heart failure, NYHA Class IV symptoms 2. CKD Stage IV 3. Permanent atrial fibrillation 4. DM2 5. HTN 6. HPL 7. Acute respiratory failure - now on BIPAP  RECOMMENDATION: 1. Acute decompensated diastolic congestive heart failure:  Clear volume overload with elevated jugular venous pressure, marked dyspnea with accessory muscle use, however appears more comfortable on BiPAP. I agree with aggressive diuresis. Will increase diuretics to 120 mg Lasix 3 times a day. Follow creatinine closely, but high likelihood he may need temporary dialysis catheter placement for ultrafiltration. 2. Permanent Atrial fibrillation: continue current rate control medicines. Based on his current decompensated heart failure, would not try to uptitrate AV nodal blockers at this time. Continue anticoagulation with Eliquis. 3. Acute respiratory failure: Secondary to acute diastolic heart failure and pulmonary edema in the setting of acute on chronic renal failure. Continue diuretics  and try to wean BiPAP as tolerated. 4. Diabetes type 2: Appreciate internal medicine management of diabetes  Advanced heart failure service to follow in the  morning.   Time Spent Directly with Patient: 60 minutes  Pixie Casino, MD, [facility] Attending Cardiologist [facility]  HILTY,Kenneth C 02/20/2014, 6:15 PM

## 2014-02-20 NOTE — Progress Notes (Signed)
Patient ID: Joseph [name], male   DOB: 1955/10/17, 58 y.o.   MRN: [id number]  PCP: Dr. Montez Morita Referring MD: Dr. Caryl Comes Nephrology: Dr Florene Glen   58 yo with history of CKD, chronic atrial fibrillation, and chronic diastolic CHF was referred to CHF clinic for evaluation of CHF and elevated PA pressure on echo.  Patient's creatinine has been as high as 4.9, probably due to HTN and diabetes.  He follows with Dr. Florene Glen.  He has been in atrial fibrillation since 6/13 when he failed a cardioversion and it was decided to leave him in atrial fibrillation.  He has been on Eliquis.  Most recent echo in 4/15 showed EF 97-67% with PA systolic pressure 85 mmHg.  He has had difficulty recently with volume overload.     RHC in 6/15 showing elevated right and left heart filling pressures with moderate pulmonary hypertension, primarily pulmonary venous hypertension.    Follow-up: Saw Amy Clegg here for increased dyspnea. Give 80 mg IV lasix in HF clinic--> 260 c urine out. She switched him to torsemide but Dr. Florene Glen said stick with lasix but increase dose to 120 bid. Weight up at home 5 pounds to 213. + marked SOB at rest. AV graft placed last week. + bloating/orthopnea. Increased cough over the last week - pink phlegm. No fevers/chills. In clinic O2 sats 71% on RA  83% on 2L   Labs (8/14): K 4.4, creatinine 3.7 Labs (6/15): K 4.5, creatinine 4.88 Labs (10/30/13) : 4.1 Creatinine 4.22 Labs: 01/15/14 K 3.8 creatinine 5.57  PMH: 1. CKD: Followed by Dr. Florene Glen, likely due to HTN and diabetes.  2. Atrial fibrillation: Chronic.  DCCV attempted in 6/13 but reverted to atrial fibrillation.  Rate control since that time.  3. H/o heavy ETOH, now does not drink. 4. Type II diabetes 5. HTN 6. Chronic diastolic CHF with elevated PA pressure: Echo (4/15) with EF 55-60%, mild MR, PA systolic pressure 85 mmHg, RV normal per report. RHC (6/15) with mean RA 12, PA 68/28 mean 44, mean PCWP 23 with prominent v-waves, CI 3.5, PVR 2.8  WU.   SH: On disability, prior smoker but quit, prior heavy ETOH but quit, lives in [place].  FH: Mother with valve replacement, sister with ESRD.   ROS: All systems reviewed and negative except as per HPI.   Current Outpatient Prescriptions  Medication Sig Dispense Refill  . amLODipine (NORVASC) 10 MG tablet Take 10 mg by mouth 2 (two) times daily.    Marland Kitchen apixaban (ELIQUIS) 5 MG TABS tablet Take 5 mg by mouth 2 (two) times daily.    Marland Kitchen atorvastatin (LIPITOR) 40 MG tablet Take 40 mg by mouth daily.    . colchicine 0.6 MG tablet Take 0.6 mg by mouth 2 (two) times daily.     . furosemide (LASIX) 80 MG tablet Take 160 mg by mouth 2 (two) times daily.     . hydrALAZINE (APRESOLINE) 25 MG tablet Take 1.5 tablets (37.5 mg total) by mouth 3 (three) times daily. 120 tablet 3  . HYDROcodone-acetaminophen (NORCO) 7.5-325 MG per tablet Take 0.5 tablets by mouth daily as needed for moderate pain. 20 tablet 0  . insulin glulisine (APIDRA) 100 UNIT/ML injection Inject 25 Units into the skin daily.    Marland Kitchen LANTUS SOLOSTAR 100 UNIT/ML injection Inject 50 Units into the skin daily. Sliding scale    . metoprolol (LOPRESSOR) 100 MG tablet Take 100 mg by mouth 2 (two) times daily.    . Multiple Vitamin (MULTIVITAMIN) tablet  Take 1 tablet by mouth daily.    Marland Kitchen OVER THE COUNTER MEDICATION Take 1 tablet by mouth daily. For joints     No current facility-administered medications for this encounter.    BP 128/66 mmHg  Pulse 90  Resp 18  Wt 213 lb 8 oz (96.843 kg)  SpO2 71% General: NAD Dyspnea at rest Neck: JVP to jaw cm.  no thyromegaly or thyroid nodule.  Lungs: Clear decreased at bases Cardiac: Irregular.  No edema.  No carotid bruit.  Normal pedal pulses.  Abdomen: Soft, nontender, no hepatosplenomegaly, ++distended.  Skin: Intact without lesions or rashes.  Neurologic: Alert and oriented x 3.  Psych: Normal affect. Extremities: No clubbing or cyanosis.  2-3+ edema   Assessment/Plan: 1. A/c  diastolic HF 2. A/c renal failure 3. Acute respiratory failure 4. Atrial fibrillation: Chronic.  Has been in atrial fibrillation for about 2 years after failing a cardioversion.  Continue rate control/anticoagulation strategy.  - Eliquis 5 mg bid (creatinine > 1.5 but no other criteria to lower the Eliquis dose are present).  - Continue current metoprolol 100 mg twice a day.  5. HTN: BP ok.   He has decompensated HF with significant volume overload and respiratory distress with minimal response to high dose diuretics. He will need to be admitted. Renal function is getting worse and I think we will need to begin HD soon. Will contact renal. Ask [facility] to help with admit.   Breshae Belcher,MD 2:30 PM

## 2014-02-21 ENCOUNTER — Encounter (HOSPITAL_COMMUNITY): Payer: Self-pay | Admitting: *Deleted

## 2014-02-21 DIAGNOSIS — I481 Persistent atrial fibrillation: Secondary | ICD-10-CM

## 2014-02-21 LAB — GLUCOSE, CAPILLARY
Glucose-Capillary: 91 mg/dL (ref 70–99)
Glucose-Capillary: 65 mg/dL — ABNORMAL LOW (ref 70–99)
Glucose-Capillary: 104 mg/dL — ABNORMAL HIGH (ref 70–99)
Glucose-Capillary: 172 mg/dL — ABNORMAL HIGH (ref 70–99)
Glucose-Capillary: 181 mg/dL — ABNORMAL HIGH (ref 70–99)

## 2014-02-21 LAB — BASIC METABOLIC PANEL
Anion gap: 15 (ref 5–15)
BUN: 84 mg/dL — AB (ref 6–23)
CHLORIDE: 106 meq/L (ref 96–112)
CO2: 23 meq/L (ref 19–32)
Calcium: 8.6 mg/dL (ref 8.4–10.5)
Creatinine, Ser: 3.94 mg/dL — ABNORMAL HIGH (ref 0.50–1.35)
GFR calc non Af Amer: 15 mL/min — ABNORMAL LOW (ref >90)
GFR, EST AFRICAN AMERICAN: 18 mL/min — AB (ref >90)
Glucose, Bld: 87 mg/dL (ref 70–99)
POTASSIUM: 3.7 meq/L (ref 3.7–5.3)
Sodium: 144 mEq/L (ref 137–147)

## 2014-02-21 LAB — CBC
HCT: 27 % — ABNORMAL LOW (ref 39.0–52.0)
HEMOGLOBIN: 8.4 g/dL — AB (ref 13.0–17.0)
MCH: 27.2 pg (ref 26.0–34.0)
MCHC: 31.1 g/dL (ref 30.0–36.0)
MCV: 87.4 fL (ref 78.0–100.0)
Platelets: 281 10*3/uL (ref 150–400)
RBC: 3.09 MIL/uL — ABNORMAL LOW (ref 4.22–5.81)
RDW: 19.3 % — AB (ref 11.5–15.5)
WBC: 8.7 10*3/uL (ref 4.0–10.5)

## 2014-02-21 LAB — MRSA PCR SCREENING: MRSA BY PCR: NEGATIVE

## 2014-02-21 MED ORDER — METOLAZONE 5 MG PO TABS
5.0000 mg | ORAL_TABLET | Freq: Two times a day (BID) | ORAL | Status: DC
  Administered 2014-02-21 – 2014-03-02 (×18): 5 mg via ORAL
  Filled 2014-02-21 (×21): qty 1

## 2014-02-21 MED ORDER — CETYLPYRIDINIUM CHLORIDE 0.05 % MT LIQD
7.0000 mL | Freq: Two times a day (BID) | OROMUCOSAL | Status: DC
  Administered 2014-02-21 – 2014-03-03 (×10): 7 mL via OROMUCOSAL

## 2014-02-21 MED ORDER — COLCHICINE 0.6 MG PO TABS
0.3000 mg | ORAL_TABLET | Freq: Every day | ORAL | Status: DC
  Administered 2014-02-22 – 2014-02-26 (×5): 0.3 mg via ORAL
  Filled 2014-02-21 (×6): qty 0.5

## 2014-02-21 MED ORDER — INSULIN GLARGINE 100 UNIT/ML ~~LOC~~ SOLN
35.0000 [IU] | Freq: Every day | SUBCUTANEOUS | Status: DC
  Administered 2014-02-21 – 2014-02-25 (×5): 35 [IU] via SUBCUTANEOUS
  Filled 2014-02-21 (×7): qty 0.35

## 2014-02-21 MED ORDER — LEVALBUTEROL HCL 0.63 MG/3ML IN NEBU: 0.6300 mg | INHALATION_SOLUTION | RESPIRATORY_TRACT | Status: DC | PRN

## 2014-02-21 MED ORDER — IPRATROPIUM BROMIDE 0.02 % IN SOLN
0.5000 mg | Freq: Three times a day (TID) | RESPIRATORY_TRACT | Status: DC
Start: 2014-02-21 — End: 2014-02-22
  Administered 2014-02-21: 0.5 mg via RESPIRATORY_TRACT
  Filled 2014-02-21: qty 2.5

## 2014-02-21 MED ORDER — CHLORHEXIDINE GLUCONATE 0.12 % MT SOLN
15.0000 mL | Freq: Two times a day (BID) | OROMUCOSAL | Status: DC
  Administered 2014-02-21 – 2014-03-03 (×12): 15 mL via OROMUCOSAL
  Filled 2014-02-21 (×15): qty 15

## 2014-02-21 MED ORDER — LEVALBUTEROL HCL 0.63 MG/3ML IN NEBU
0.6300 mg | INHALATION_SOLUTION | Freq: Three times a day (TID) | RESPIRATORY_TRACT | Status: DC
  Administered 2014-02-21: 0.63 mg via RESPIRATORY_TRACT
  Filled 2014-02-21 (×4): qty 3

## 2014-02-21 MED ORDER — DARBEPOETIN ALFA 100 MCG/0.5ML IJ SOSY
100.0000 ug | PREFILLED_SYRINGE | INTRAMUSCULAR | Status: DC
  Administered 2014-02-21: 100 ug via SUBCUTANEOUS
  Filled 2014-02-21 (×2): qty 0.5

## 2014-02-21 NOTE — Progress Notes (Signed)
Advanced Heart Failure Rounding Note   Subjective:   PCP: Dr. Montez Morita Referring MD: Dr. Caryl Comes Nephrology: Dr Florene Glen   58 yo with history of CKD, chronic atrial fibrillation, and chronic diastolic CHF was referred to CHF clinic for evaluation of CHF and elevated PA pressure on echo. Patient's creatinine has been as high as 4.9, probably due to HTN and diabetes. He follows with Dr. Florene Glen. He has been in atrial fibrillation since 6/13 when he failed a cardioversion and it was decided to leave him in atrial fibrillation. He has been on Eliquis. Most recent echo in 4/15 showed EF 32-99% with PA systolic pressure 85 mmHg. He has had difficulty recently with volume overload.   RHC in 6/15 showing elevated right and left heart filling pressures with moderate pulmonary hypertension, primarily pulmonary venous hypertension.   Admitted last night with decompensated HF and progressive renal failure. Placed on lasix 120 mg IV three times a day. 900 cc urine output. Requiring BiPap.   Renal evaluated with recommendations for IV diuretics.   Creatinine 3.94   Objective:   Weight Range:  Vital Signs:   Temp:  [98 F (36.7 C)-98.9 F (37.2 C)] 98 F (36.7 C) (11/04 0813) Pulse Rate:  [41-117] 92 (11/04 0813) Resp:  [13-27] 20 (11/04 0813) BP: (126-158)/(63-92) 144/69 mmHg (11/04 0813) SpO2:  [71 %-100 %] 97 % (11/04 0813) FiO2 (%):  [40 %] 40 % (11/04 0813) Weight:  [207 lb 7.3 oz (94.1 kg)-213 lb 8 oz (96.843 kg)] 207 lb 7.3 oz (94.1 kg) (11/04 0040)    Weight change: Filed Weights   02/21/14 0040  Weight: 207 lb 7.3 oz (94.1 kg)    Intake/Output:   Intake/Output Summary (Last 24 hours) at 02/21/14 0818 Last data filed at 02/21/14 0257  Gross per 24 hour  Intake     62 ml  Output    900 ml  Net   -838 ml     Physical Exam: General:  Chronically ill appearing. On Bipap HEENT: normal Neck: supple. JVP to jaw  Carotids 2+ bilat; no bruits. No lymphadenopathy or  thryomegaly appreciated. Cor: PMI nondisplaced. Irregular rate & rhythm. No rubs, gallops or murmurs. Lungs: clear On BiPaP  Abdomen: soft, nontender, distended. No hepatosplenomegaly. No bruits or masses. Good bowel sounds. Extremities: no cyanosis, clubbing, rash, R and LLE 1+ edema Neuro: alert & orientedx3, cranial nerves grossly intact. moves all 4 extremities w/o difficulty. Affect pleasant  Telemetry: A flutter 90s   Labs: Basic Metabolic Panel:  Recent Labs Lab 02/20/14 1515 02/21/14 0344  NA 142 144  K 4.1 3.7  CL 103 106  CO2 21 23  GLUCOSE 124* 87  BUN 86* 84*  CREATININE 3.98* 3.94*  CALCIUM 9.0 8.6    Liver Function Tests: No results for input(s): AST, ALT, ALKPHOS, BILITOT, PROT, ALBUMIN in the last 168 hours. No results for input(s): LIPASE, AMYLASE in the last 168 hours. No results for input(s): AMMONIA in the last 168 hours.  CBC:  Recent Labs Lab 02/20/14 1515 02/21/14 0344  WBC 9.3 8.7  NEUTROABS 7.2  --   HGB 9.2* 8.4*  HCT 29.4* 27.0*  MCV 87.5 87.4  PLT 296 281    Cardiac Enzymes: No results for input(s): CKTOTAL, CKMB, CKMBINDEX, TROPONINI in the last 168 hours.  BNP: BNP (last 3 results)  Recent Labs  01/15/14 1221 02/20/14 1515  PROBNP 9297.0* 15468.0*     Other results:  EKG:   Imaging: Dg Chest 2 View  02/20/2014   CLINICAL DATA:  Shortness of breath and cough for 8 days.  EXAM: CHEST  2 VIEW  COMPARISON:  02/13/2014  FINDINGS: Stable borderline cardiomegaly.  Negative aortic contours and hila.  Diffuse interstitial opacity persists, with Kerley lines. No significant pleural effusion. There is a newly seen focal opacity in the right upper lung which is asymmetric.  IMPRESSION: 1. Persistent pulmonary edema. 2. Focal opacity in the right upper lung may reflect asymmetry alveolar edema, but pneumonia cannot be excluded.   Electronically Signed   By: Jorje Guild M.D.   On: 02/20/2014 16:00      Medications:      Scheduled Medications: . albuterol  2.5 mg Nebulization Q6H  . amLODipine  10 mg Oral BID  . antiseptic oral rinse  7 mL Mouth Rinse q12n4p  . apixaban  5 mg Oral BID  . atorvastatin  40 mg Oral Daily  . chlorhexidine  15 mL Mouth Rinse BID  . colchicine  0.6 mg Oral BID  . docusate sodium  100 mg Oral BID  . furosemide  120 mg Intravenous Q8H  . hydrALAZINE  37.5 mg Oral TID  . insulin aspart  0-15 Units Subcutaneous TID WC  . insulin glargine  35 Units Subcutaneous QHS  . ipratropium  0.5 mg Nebulization Q6H  . metoprolol  100 mg Oral BID  . multivitamin with minerals   Oral Daily  . senna  1 tablet Oral BID  . sodium chloride  3 mL Intravenous Q12H     Infusions:     PRN Medications:  acetaminophen **OR** acetaminophen, albuterol, HYDROcodone-acetaminophen, ondansetron **OR** ondansetron (ZOFRAN) IV   Assessment:  1. Acute decompensated diastolic congestive heart failure, NYHA Class IV symptoms 2. CKD Stage IV 3. Permanent atrial fibrillation/AFL 4. DM2 5. HTN 6. HPL 7. Acute respiratory failure - now on BIPAP  Plan/Discussion:   Marked volume overload. Continue IV lasix 120 mg tid. + metolazone 5 mg daily.    In A flutter with controlled rate. Has been in A fib for 2 years after failing cardioversion. On eliquis.     Renal following.    Length of Stay: 1  CLEGG,AMY 02/21/2014, 8:18 AM   Patient seen and examined with Darrick Grinder, NP. We discussed all aspects of the encounter. I agree with the assessment and plan as stated above.   Remains on bipap. Only moderate response to high dose IV diuretics. Will add metoalzone. I am not convinced that we will be able to find an oral regimen that we will be able to keep him dry as an outpatient. If Renal feels they can do this would ask them to assume management of inpatient and outpatient diuretic regimen. (We had previously changed him to torsemide in Clinic prior to admission and he was quickly switched back to  lasix by his nephrologist).   Benay Spice 8:43 AM  Advanced Heart Failure Team Pager [phone] (M-F; 7a - 4p)  Please contact [facility] for night-coverage after hours (4p -7a ) and weekends on [email or web address]

## 2014-02-21 NOTE — Progress Notes (Signed)
02/21/2014 1430 UR completed. Chart reviewed. Jonnie Finner RN CCM CCM Case Mgmt

## 2014-02-21 NOTE — Progress Notes (Signed)
[facility] TEAM 1 - Stepdown/ICU TEAM Progress Note  Joseph [name]:[id number] DOB: Jun 11, 1955 DOA: 02/20/2014 PCP: Patricia Nettle, MD  Admit HPI / Brief Narrative: 58 yo with history of CKD, chronic atrial fibrillation, and chronic diastolic CHF, has been in atrial fibrillation since 6/13 when he failed a cardioversion and it was decided to leave him in atrial fibrillation. He has been on Eliquis. Most recent echo in 4/15 showed EF 65-03% with PA systolic pressure 85 mmHg.  Patient is being followed by Dr. Florene Glen from Nephrology as he is known to have chronic kidney disease with a creatinine around 4.  The pt presented to the CHF Clinic with volume overload picture, increased O2 demands, and significant dyspnea, so patient was transferred to ED.  In the ED he had a significantly elevated BNP, chest x-ray noted pulmonary edema, and exam noted worsening lower extremity edema, with significant orthopnea.   HPI/Subjective: Pt states he feels "about the same" with no significant improvement in his dyspnea thus far.  He denies cp, n/v, or abdom pain at this time.    Assessment/Plan:  Acute decompensation of chronic diastolic CHF -Most recent echo in April showing EF 54%, with diastolic dysfunction -patient on 160 mg oral Lasix 2 times a day, and despite that he appears to be volume overloaded -CHF Team and Nephrology both assisting w/ care - unclear at this time if HD will be required  Acute on chronic hypoxic respiratory failure -secondary to volume overload due to his diastolic CHF and progression of his renal disease -now off BIPAP but requiring high level O2 via FM  Chronic kidney disease stage 3 -remains around baseline with a creatinine of 4. -AV graft placed last week -care as per Nephrology   ?RUL infiltrate -no subjective sx to suggest pulmonary infection, apart from dyspnea - will repeat CXR in 2-3 days after diuresed further   Chronic Atrial fibrillation -rate controlled  with current medical tx  -Eliquis for anticoagulation per CHF Team   Diabetes mellitus -CBG currently well controlled - follow trend   Hyperlipidemia -Continue with statin  HTN  Code Status: FULL Family Communication: no family present at time of exam Disposition Plan: SDY  Consultants: CHF Team  Nephrology   Procedures: none  Antibiotics: none  DVT prophylaxis: Eliquis  Objective: Blood pressure 167/78, pulse 104, temperature 98 F (36.7 C), temperature source Axillary, resp. rate 18, height 6' (1.829 m), weight 94.1 kg (207 lb 7.3 oz), SpO2 92 %.  Intake/Output Summary (Last 24 hours) at 02/21/14 1119 Last data filed at 02/21/14 0930  Gross per 24 hour  Intake    262 ml  Output    900 ml  Net   -638 ml   Exam: General: No acute respiratory distress, but requiring high flow O2 to be comfortable  Lungs: fine crackles th/o  Cardiovascular: Regular rate without murmur gallop or rub normal S1 and S2 Abdomen: Nontender, nondistended, soft, bowel sounds positive, no rebound, no ascites, no appreciable mass Extremities: No significant cyanosis, clubbing, or edema bilateral lower extremities  Data Reviewed: Basic Metabolic Panel:  Recent Labs Lab 02/20/14 1515 02/21/14 0344  NA 142 144  K 4.1 3.7  CL 103 106  CO2 21 23  GLUCOSE 124* 87  BUN 86* 84*  CREATININE 3.98* 3.94*  CALCIUM 9.0 8.6    Liver Function Tests: No results for input(s): AST, ALT, ALKPHOS, BILITOT, PROT, ALBUMIN in the last 168 hours. No results for input(s): LIPASE, AMYLASE in the last  168 hours. No results for input(s): AMMONIA in the last 168 hours.   CBC:  Recent Labs Lab 02/20/14 1515 02/21/14 0344  WBC 9.3 8.7  NEUTROABS 7.2  --   HGB 9.2* 8.4*  HCT 29.4* 27.0*  MCV 87.5 87.4  PLT 296 281   CBG:  Recent Labs Lab 02/20/14 2259 02/21/14 0209  GLUCAP 127* 91    Recent Results (from the past 240 hour(s))  MRSA PCR Screening     Status: None   Collection Time:  02/21/14 12:46 AM  Result Value Ref Range Status   MRSA by PCR NEGATIVE NEGATIVE Final    Comment:        The GeneXpert MRSA Assay (FDA approved for NASAL specimens only), is one component of a comprehensive MRSA colonization surveillance program. It is not intended to diagnose MRSA infection nor to guide or monitor treatment for MRSA infections.      Studies:  Recent x-ray studies have been reviewed in detail by the Attending Physician  Scheduled Meds:  Scheduled Meds: . albuterol  2.5 mg Nebulization Q6H  . amLODipine  10 mg Oral BID  . antiseptic oral rinse  7 mL Mouth Rinse q12n4p  . apixaban  5 mg Oral BID  . atorvastatin  40 mg Oral Daily  . chlorhexidine  15 mL Mouth Rinse BID  . colchicine  0.6 mg Oral BID  . docusate sodium  100 mg Oral BID  . furosemide  120 mg Intravenous Q8H  . hydrALAZINE  37.5 mg Oral TID  . insulin aspart  0-15 Units Subcutaneous TID WC  . insulin glargine  35 Units Subcutaneous QHS  . ipratropium  0.5 mg Nebulization Q6H  . metolazone  5 mg Oral BID  . metoprolol  100 mg Oral BID  . multivitamin with minerals   Oral Daily  . senna  1 tablet Oral BID  . sodium chloride  3 mL Intravenous Q12H    Time spent on care of this patient: 35 mins   MCCLUNG,JEFFREY T , MD   [facility] Office  [phone] Pager - Text Page per Shea Evans as per below:  On-Call/Text Page:      [email or web address]      password [email or web address]  If 7PM-7AM, please contact night-coverage [email or web address] Password [email or web address] 02/21/2014, 11:19 AM   LOS: 1 day

## 2014-02-21 NOTE — Progress Notes (Signed)
[facility] Rounding Note Subjective:  Dyspnea - on venti mask Says feels better than he did on admission Relates his "decompensation" occurred post AVG placement and "Homecoming" Making more urine on IV lasix  Objective Vital signs in last 24 hours: Filed Vitals:   02/21/14 0848 02/21/14 0945 02/21/14 1023 02/21/14 1202  BP:   167/78 147/76  Pulse:  64 104 95  Temp:    97.3 F (36.3 C)  TempSrc:    Axillary  Resp:  18  25  Height:      Weight:      SpO2: 92% 92%  90%   Weight change:   Intake/Output Summary (Last 24 hours) at 02/21/14 1229 Last data filed at 02/21/14 0930  Gross per 24 hour  Intake    262 ml  Output    900 ml  Net   -638 ml   Physical Exam:  BP 147/76 mmHg  Pulse 95  Temp(Src) 97.3 F (36.3 C) (Axillary)  Resp 25  Ht 6' (1.829 m)  Wt 94.1 kg (207 lb 7.3 oz)  BMI 28.13 kg/m2  SpO2 90%  General appearance: alert and cooperative Head: Normocephalic, without obvious abnormality, atraumatic, wearing ventimask + JVD to jaw angle Resp: diminished breath sounds bilaterally Chest wall: no tenderness Cardio:Irregularly irregular rate 100's, S1, S2 normal, no murmur, click, rub or gallop GI: Mild distension, protuberant. Soft. No focal tenderness. Liver edge down Small umbilical hernia Extremities: edema 1-2+ pretibial  Left FA AVG + bruit  Arm edematous Incision glued, clean and dry Skin: Skin color, texture, turgor normal. No rashes or lesions Neurologic: Grossly normal  Weight Trending 11/4 94.1 kg  Labs: Basic Metabolic Panel:  Recent Labs Lab 02/20/14 1515 02/21/14 0344  NA 142 144  K 4.1 3.7  CL 103 106  CO2 21 23  GLUCOSE 124* 87  BUN 86* 84*  CREATININE 3.98* 3.94*  CALCIUM 9.0 8.6   Recent Labs Lab 02/20/14 1515 02/21/14 0344  WBC 9.3 8.7  NEUTROABS 7.2  --   HGB 9.2* 8.4*  HCT 29.4* 27.0*  MCV 87.5 87.4  PLT 296 281   Recent Labs Lab 02/20/14 2259 02/21/14 0209 02/21/14 1037 02/21/14 1210  GLUCAP  127* 91 65* 104*    Studies/Results: Dg Chest 2 View  02/20/2014   CLINICAL DATA:  Shortness of breath and cough for 8 days.  EXAM: CHEST  2 VIEW  COMPARISON:  02/13/2014  FINDINGS: Stable borderline cardiomegaly.  Negative aortic contours and hila.  Diffuse interstitial opacity persists, with Kerley lines. No significant pleural effusion. There is a newly seen focal opacity in the right upper lung which is asymmetric.  IMPRESSION: 1. Persistent pulmonary edema. 2. Focal opacity in the right upper lung may reflect asymmetry alveolar edema, but pneumonia cannot be excluded.   Electronically Signed   By: Jorje Guild M.D.   On: 02/20/2014 16:00   Medications:   . albuterol  2.5 mg Nebulization Q6H  . amLODipine  10 mg Oral BID  . antiseptic oral rinse  7 mL Mouth Rinse q12n4p  . apixaban  5 mg Oral BID  . atorvastatin  40 mg Oral Daily  . chlorhexidine  15 mL Mouth Rinse BID  . [START ON 02/22/2014] colchicine  0.3 mg Oral Daily  . docusate sodium  100 mg Oral BID  . furosemide  120 mg Intravenous Q8H  . hydrALAZINE  37.5 mg Oral TID  . insulin aspart  0-15 Units Subcutaneous TID WC  . insulin  glargine  35 Units Subcutaneous QHS  . ipratropium  0.5 mg Nebulization Q6H  . metolazone  5 mg Oral BID  . metoprolol  100 mg Oral BID  . multivitamin with minerals   Oral Daily  . senna  1 tablet Oral BID  . sodium chloride  3 mL Intravenous Q12H    BACKGROUND 58 yo diabetic male with a history of stage 4 CKD prob due to diabetes mellitus, (followed by Dr. Florene Glen) s/p L FA AVG 10/27, chronic atrial fibrillation Rx apixiban, and chronic diastolic CHF.He is admitted on this occasion with decompenstaed CHF. He reports medication compliance but it was Homecoming Weekend" so perhaps some dietary indiscretion. Has had some challenges recently with fluid overload. Echo in April showed a 55-60% EF/ pulm HTN with elevated PA pressures 80's. Baseline renal function - creatinines most recently in the  4-5.5 range (4.25 10/2013, 5.57 01/15/14, 3.98 on admission 11/3).  Nephrology asked to see for diuretic management.  Assessment/Recommdations   Stage 4/5 CKD d/t DM -  S/P left FA AVG 10/27  CHF/Vol overload - on IV lasix 120 TID. Cards has added metolazone 5 mg today. Still room to increase IV lasix if inadequate diuresis.  Dr. Florene Glen has indicated he is willing to manage the diuretics as an outpt (in response to Dr. Damaris Schooner query - I spoke with Dr. Florene Glen today by phone)  He thinks/hopes pt will be able to avoid dialysis a little while longer with proper diet and medication adherence (at least until AVG mature which will be late November)  r/o PNA - will need repeat CXR after diuresis to reevaluate RUL abnl  Pulmonary hypertension  Permanent atrial fibrillation - on Eliquis  Anemia - needs Fe studies, start Aranesp 100/week  CKD-MBD - check PTH, phosphorus   Jamal Maes, MD [place] pager 02/21/2014, 12:29 PM

## 2014-02-21 NOTE — Progress Notes (Signed)
Patient transported to [facility] from ER on vent (Bipap) with no complications.

## 2014-02-21 NOTE — Progress Notes (Signed)
Placed on VM due to low sats

## 2014-02-22 DIAGNOSIS — I270 Primary pulmonary hypertension: Secondary | ICD-10-CM

## 2014-02-22 DIAGNOSIS — N183 Chronic kidney disease, stage 3 unspecified: Secondary | ICD-10-CM | POA: Insufficient documentation

## 2014-02-22 DIAGNOSIS — E785 Hyperlipidemia, unspecified: Secondary | ICD-10-CM | POA: Insufficient documentation

## 2014-02-22 DIAGNOSIS — I509 Heart failure, unspecified: Secondary | ICD-10-CM | POA: Insufficient documentation

## 2014-02-22 DIAGNOSIS — E119 Type 2 diabetes mellitus without complications: Secondary | ICD-10-CM | POA: Insufficient documentation

## 2014-02-22 DIAGNOSIS — J9621 Acute and chronic respiratory failure with hypoxia: Secondary | ICD-10-CM | POA: Insufficient documentation

## 2014-02-22 DIAGNOSIS — I272 Pulmonary hypertension, unspecified: Secondary | ICD-10-CM | POA: Insufficient documentation

## 2014-02-22 LAB — RENAL FUNCTION PANEL
Albumin: 2.8 g/dL — ABNORMAL LOW (ref 3.5–5.2)
Anion gap: 18 — ABNORMAL HIGH (ref 5–15)
BUN: 80 mg/dL — ABNORMAL HIGH (ref 6–23)
CO2: 21 meq/L (ref 19–32)
Calcium: 9 mg/dL (ref 8.4–10.5)
Chloride: 105 mEq/L (ref 96–112)
Creatinine, Ser: 3.96 mg/dL — ABNORMAL HIGH (ref 0.50–1.35)
GFR calc non Af Amer: 15 mL/min — ABNORMAL LOW (ref >90)
GFR, EST AFRICAN AMERICAN: 18 mL/min — AB (ref >90)
Glucose, Bld: 115 mg/dL — ABNORMAL HIGH (ref 70–99)
Phosphorus: 4.8 mg/dL — ABNORMAL HIGH (ref 2.3–4.6)
Potassium: 4.1 mEq/L (ref 3.7–5.3)
SODIUM: 144 meq/L (ref 137–147)

## 2014-02-22 LAB — GLUCOSE, CAPILLARY
GLUCOSE-CAPILLARY: 188 mg/dL — AB (ref 70–99)
Glucose-Capillary: 80 mg/dL (ref 70–99)
Glucose-Capillary: 148 mg/dL — ABNORMAL HIGH (ref 70–99)
Glucose-Capillary: 235 mg/dL — ABNORMAL HIGH (ref 70–99)

## 2014-02-22 LAB — IRON AND TIBC
IRON: 22 ug/dL — AB (ref 42–135)
Saturation Ratios: 8 % — ABNORMAL LOW (ref 20–55)
TIBC: 269 ug/dL (ref 215–435)
UIBC: 247 ug/dL (ref 125–400)

## 2014-02-22 LAB — CBC
HEMATOCRIT: 29 % — AB (ref 39.0–52.0)
HEMOGLOBIN: 8.8 g/dL — AB (ref 13.0–17.0)
MCH: 27.2 pg (ref 26.0–34.0)
MCHC: 30.3 g/dL (ref 30.0–36.0)
MCV: 89.8 fL (ref 78.0–100.0)
Platelets: 283 10*3/uL (ref 150–400)
RBC: 3.23 MIL/uL — ABNORMAL LOW (ref 4.22–5.81)
RDW: 19.2 % — ABNORMAL HIGH (ref 11.5–15.5)
WBC: 9 10*3/uL (ref 4.0–10.5)

## 2014-02-22 LAB — HEPATIC FUNCTION PANEL
ALBUMIN: 2.8 g/dL — AB (ref 3.5–5.2)
ALK PHOS: 77 U/L (ref 39–117)
ALT: 25 U/L (ref 0–53)
AST: 25 U/L (ref 0–37)
Bilirubin, Direct: 0.2 mg/dL (ref 0.0–0.3)
Indirect Bilirubin: 0.8 mg/dL (ref 0.3–0.9)
Total Bilirubin: 1 mg/dL (ref 0.3–1.2)
Total Protein: 6.9 g/dL (ref 6.0–8.3)

## 2014-02-22 LAB — PARATHYROID HORMONE, INTACT (NO CA): PTH: 248 pg/mL — ABNORMAL HIGH (ref 14–64)

## 2014-02-22 MED ORDER — CHLORHEXIDINE GLUCONATE CLOTH 2 % EX PADS: 6.0000 | MEDICATED_PAD | Freq: Once | CUTANEOUS | Status: DC

## 2014-02-22 MED ORDER — LEVALBUTEROL HCL 0.63 MG/3ML IN NEBU
0.6300 mg | INHALATION_SOLUTION | Freq: Three times a day (TID) | RESPIRATORY_TRACT | Status: DC
  Administered 2014-02-22 – 2014-02-24 (×7): 0.63 mg via RESPIRATORY_TRACT
  Filled 2014-02-22 (×13): qty 3

## 2014-02-22 MED ORDER — DILTIAZEM HCL 30 MG PO TABS
30.0000 mg | ORAL_TABLET | Freq: Four times a day (QID) | ORAL | Status: DC
  Administered 2014-02-22 – 2014-02-23 (×4): 30 mg via ORAL
  Filled 2014-02-22 (×8): qty 1

## 2014-02-22 MED ORDER — IPRATROPIUM BROMIDE 0.02 % IN SOLN
0.5000 mg | Freq: Three times a day (TID) | RESPIRATORY_TRACT | Status: DC
  Administered 2014-02-22 – 2014-02-24 (×7): 0.5 mg via RESPIRATORY_TRACT
  Filled 2014-02-22 (×7): qty 2.5

## 2014-02-22 MED ORDER — OXYCODONE HCL 5 MG/5ML PO SOLN: 5.0000 mg | Freq: Once | ORAL | Status: AC | PRN

## 2014-02-22 MED ORDER — OXYCODONE HCL 5 MG PO TABS: 5.0000 mg | ORAL_TABLET | Freq: Once | ORAL | Status: AC | PRN

## 2014-02-22 MED ORDER — PROMETHAZINE HCL 25 MG/ML IJ SOLN: 6.2500 mg | INTRAMUSCULAR | Status: DC | PRN

## 2014-02-22 MED ORDER — MIDAZOLAM HCL 2 MG/2ML IJ SOLN: 0.5000 mg | Freq: Once | INTRAMUSCULAR | Status: AC | PRN

## 2014-02-22 MED ORDER — MEPERIDINE HCL 25 MG/ML IJ SOLN: 6.2500 mg | INTRAMUSCULAR | Status: DC | PRN

## 2014-02-22 MED ORDER — FENTANYL CITRATE 0.05 MG/ML IJ SOLN: 25.0000 ug | INTRAMUSCULAR | Status: DC | PRN

## 2014-02-22 NOTE — Progress Notes (Signed)
[facility] Rounding Note Subjective:  Feeling better but still DOE Excellent diuretic response with UOP 3.4 liters/24 hours Pt feels left arm also less swollen  Objective Vital signs in last 24 hours: Filed Vitals:   02/21/14 2300 02/22/14 0000 02/22/14 0300 02/22/14 0400  BP:   137/74 137/74  Pulse: 101 81 73 75  Temp: 97.8 F (36.6 C)  97.8 F (36.6 C)   TempSrc: Oral  Oral   Resp: 20 21 22    Height:      Weight:   93.7 kg (206 lb 9.1 oz)   SpO2: 94% 90% 90% 89%   Weight change: -0.4 kg (-14.1 oz)  Intake/Output Summary (Last 24 hours) at 02/22/14 0721 Last data filed at 02/22/14 0600  Gross per 24 hour  Intake    840 ml  Output   3475 ml  Net  -2635 ml   Physical Exam:  BP 137/74 mmHg  Pulse 75  Temp(Src) 97.8 F (36.6 C) (Oral)  Resp 22  Ht 6' (1.829 m)  Wt 93.7 kg (206 lb 9.1 oz)  BMI 28.01 kg/m2  SpO2 89%  General appearance: alert and cooperative Head: Normocephalic, without obvious abnormality, atraumatic, wearing ventimask + JVD to jaw angle Resp: diminished breath sounds bilaterally still with crackles Cardio:Irregularly irregular rate 100's, S1, S2 normal, no murmur, click, rub or gallop GI: Mild distension, protuberant. Soft. No focal tenderness. Liver edge down Small umbilical hernia Extremities: edema 1+ pretibial Left FA AVG + bruit with perhaps sl less arm edema. Incision remains clean and dry Neurologic: Grossly normal  Weight Trending  11/4 94.1 kg 11/5 93.7  Labs: Basic Metabolic Panel:  Recent Labs Lab 02/20/14 1515 02/21/14 0344 02/22/14 0244  NA 142 144 144  K 4.1 3.7 4.1  CL 103 106 105  CO2 21 23 21   GLUCOSE 124* 87 115*  BUN 86* 84* 80*  CREATININE 3.98* 3.94* 3.96*  CALCIUM 9.0 8.6 9.0  PHOS  --   --  4.8*    Recent Labs Lab 02/20/14 1515 02/21/14 0344 02/22/14 0244  WBC 9.3 8.7 9.0  NEUTROABS 7.2  --   --   HGB 9.2* 8.4* 8.8*  HCT 29.4* 27.0* 29.0*  MCV 87.5 87.4 89.8  PLT 296 281 283     Recent Labs Lab 02/21/14 0209 02/21/14 1037 02/21/14 1210 02/21/14 1645 02/21/14 2113  GLUCAP 91 65* 104* 172* 181*    Studies/Results: Dg Chest 2 View  02/20/2014   CLINICAL DATA:  Shortness of breath and cough for 8 days.  EXAM: CHEST  2 VIEW  COMPARISON:  02/13/2014  FINDINGS: Stable borderline cardiomegaly.  Negative aortic contours and hila.  Diffuse interstitial opacity persists, with Kerley lines. No significant pleural effusion. There is a newly seen focal opacity in the right upper lung which is asymmetric.  IMPRESSION: 1. Persistent pulmonary edema. 2. Focal opacity in the right upper lung may reflect asymmetry alveolar edema, but pneumonia cannot be excluded.   Electronically Signed   By: Jorje Guild M.D.   On: 02/20/2014 16:00   Medications:   . amLODipine  10 mg Oral BID  . antiseptic oral rinse  7 mL Mouth Rinse q12n4p  . apixaban  5 mg Oral BID  . atorvastatin  40 mg Oral Daily  . chlorhexidine  15 mL Mouth Rinse BID  . Chlorhexidine Gluconate Cloth  6 each Topical Once   And  . Chlorhexidine Gluconate Cloth  6 each Topical Once  . colchicine  0.3  mg Oral Daily  . darbepoetin (ARANESP) injection - NON-DIALYSIS  100 mcg Subcutaneous Q Wed-1800  . docusate sodium  100 mg Oral BID  . furosemide  120 mg Intravenous Q8H  . hydrALAZINE  37.5 mg Oral TID  . insulin aspart  0-15 Units Subcutaneous TID WC  . insulin glargine  35 Units Subcutaneous QHS  . ipratropium  0.5 mg Nebulization TID  . levalbuterol  0.63 mg Nebulization TID  . metolazone  5 mg Oral BID  . metoprolol  100 mg Oral BID  . multivitamin with minerals   Oral Daily  . senna  1 tablet Oral BID  . sodium chloride  3 mL Intravenous Q12H    BACKGROUND 58 yo diabetic male with a history of stage 4 CKD prob due to diabetes mellitus, (followed by Dr. Florene Glen) s/p L FA AVG 10/27, chronic atrial fibrillation Rx apixiban, and chronic diastolic CHF.He is admitted on this occasion with decompenstaed  CHF. He reports medication compliance but it was Homecoming Weekend" so perhaps some dietary indiscretion. Has had some challenges recently with fluid overload. Echo in April showed a 55-60% EF/ pulm HTN with elevated PA pressures 80's. Baseline renal function - creatinines most recently in the 4-5.5 range (4.25 10/2013, 5.57 01/15/14, 3.98 on admission 11/3).  Nephrology asked to see for diuretic management.  Assessment/Recommdations   Stage 4/5 CKD d/t DM -  S/P left FA AVG 10/27. Stable GFR so far in face of diuresis  CHF/Vol overload - on IV lasix 120 TID plus metolazone 5 mg/day and having good diuresis now.   Still room to increase IV lasix if we need to.  Dr. Florene Glen has indicated he is willing to manage the diuretics as an outpt (in response to Dr. Damaris Schooner query - I spoke with Dr. Florene Glen by phone)  He thinks/hopes pt will be able to avoid dialysis a little while longer with proper diet and medication adherence (at least until AVG mature which will be late November)  r/o PNA - will need repeat CXR after diuresis to reevaluate RUL abnl  Pulmonary hypertension  Permanent atrial fibrillation - on Eliquis  Anemia - iron studies pending, started Aranesp 100/week Tawny Asal)  CKD-MBD - check PTH (pending), phosphorus is fine (4.8)   Jamal Maes, MD [facility] [phone] pager 02/22/2014, 7:21 AM

## 2014-02-22 NOTE — Progress Notes (Signed)
Advanced Heart Failure Rounding Note   Subjective:   PCP: Dr. Montez Morita Referring MD: Dr. Caryl Comes Nephrology: Dr Florene Glen   58 yo with history of CKD, chronic atrial fibrillation, and chronic diastolic CHF was referred to CHF clinic for evaluation of CHF and elevated PA pressure on echo. Patient's creatinine has been as high as 4.9, probably due to HTN and diabetes. He follows with Dr. Florene Glen. He has been in atrial fibrillation since 6/13 when he failed a cardioversion and it was decided to leave him in atrial fibrillation. He has been on Eliquis. Most recent echo in 4/15 showed EF 29-93% with PA systolic pressure 85 mmHg. He has had difficulty recently with volume overload.   RHC in 6/15 showing elevated right and left heart filling pressures with moderate pulmonary hypertension, primarily pulmonary venous hypertension.   Admitted with decompensated HF and progressive renal failure. Placed on lasix 120 mg IV three times a day. Metolazone added. Weight down only 1 pound. Still requiring high flow O2. AF rate ~110  Creatinine stable at 3.9   Objective:   Weight Range:  Vital Signs:   Temp:  [97.3 F (36.3 C)-98.2 F (36.8 C)] 98.2 F (36.8 C) (11/05 0744) Pulse Rate:  [73-145] 80 (11/05 0800) Resp:  [18-26] 20 (11/05 0800) BP: (137-165)/(73-97) 147/82 mmHg (11/05 0800) SpO2:  [85 %-94 %] 94 % (11/05 0855) FiO2 (%):  [55 %-66 %] 66 % (11/05 0855) Weight:  [206 lb 9.1 oz (93.7 kg)] 206 lb 9.1 oz (93.7 kg) (11/05 0300)    Weight change: Filed Weights   02/21/14 0040 02/22/14 0300  Weight: 207 lb 7.3 oz (94.1 kg) 206 lb 9.1 oz (93.7 kg)    Intake/Output:   Intake/Output Summary (Last 24 hours) at 02/22/14 1142 Last data filed at 02/22/14 0854  Gross per 24 hour  Intake    740 ml  Output   2775 ml  Net  -2035 ml     Physical Exam: General:  Lying in bed on FM O2. +dyspneic HEENT: normal Neck: supple. JVP to jaw  Carotids 2+ bilat; no bruits. No lymphadenopathy or  thryomegaly appreciated. Cor: PMI nondisplaced. Tachy Irregular rate & rhythm. No rubs, gallops or murmurs. Lungs: clear On BiPaP  Abdomen: soft, nontender, distended. No hepatosplenomegaly. No bruits or masses. Good bowel sounds. Extremities: no cyanosis, clubbing, rash, R and LLE 1+ edema Neuro: alert & orientedx3, cranial nerves grossly intact. moves all 4 extremities w/o difficulty. Affect pleasant  Telemetry: A flutter 100-115  Labs: Basic Metabolic Panel:  Recent Labs Lab 02/20/14 1515 02/21/14 0344 02/22/14 0244  NA 142 144 144  K 4.1 3.7 4.1  CL 103 106 105  CO2 21 23 21   GLUCOSE 124* 87 115*  BUN 86* 84* 80*  CREATININE 3.98* 3.94* 3.96*  CALCIUM 9.0 8.6 9.0  PHOS  --   --  4.8*    Liver Function Tests:  Recent Labs Lab 02/22/14 0244  AST 25  ALT 25  ALKPHOS 77  BILITOT 1.0  PROT 6.9  ALBUMIN 2.8*  2.8*   No results for input(s): LIPASE, AMYLASE in the last 168 hours. No results for input(s): AMMONIA in the last 168 hours.  CBC:  Recent Labs Lab 02/20/14 1515 02/21/14 0344 02/22/14 0244  WBC 9.3 8.7 9.0  NEUTROABS 7.2  --   --   HGB 9.2* 8.4* 8.8*  HCT 29.4* 27.0* 29.0*  MCV 87.5 87.4 89.8  PLT 296 281 283    Cardiac Enzymes: No results for  input(s): CKTOTAL, CKMB, CKMBINDEX, TROPONINI in the last 168 hours.  BNP: BNP (last 3 results)  Recent Labs  01/15/14 1221 02/20/14 1515  PROBNP 9297.0* 15468.0*     Other results:  EKG:   Imaging: Dg Chest 2 View  02/20/2014   CLINICAL DATA:  Shortness of breath and cough for 8 days.  EXAM: CHEST  2 VIEW  COMPARISON:  02/13/2014  FINDINGS: Stable borderline cardiomegaly.  Negative aortic contours and hila.  Diffuse interstitial opacity persists, with Kerley lines. No significant pleural effusion. There is a newly seen focal opacity in the right upper lung which is asymmetric.  IMPRESSION: 1. Persistent pulmonary edema. 2. Focal opacity in the right upper lung may reflect asymmetry alveolar  edema, but pneumonia cannot be excluded.   Electronically Signed   By: Jorje Guild M.D.   On: 02/20/2014 16:00     Medications:     Scheduled Medications: . amLODipine  10 mg Oral BID  . antiseptic oral rinse  7 mL Mouth Rinse q12n4p  . apixaban  5 mg Oral BID  . atorvastatin  40 mg Oral Daily  . chlorhexidine  15 mL Mouth Rinse BID  . Chlorhexidine Gluconate Cloth  6 each Topical Once   And  . Chlorhexidine Gluconate Cloth  6 each Topical Once  . colchicine  0.3 mg Oral Daily  . darbepoetin (ARANESP) injection - NON-DIALYSIS  100 mcg Subcutaneous Q Wed-1800  . docusate sodium  100 mg Oral BID  . furosemide  120 mg Intravenous Q8H  . hydrALAZINE  37.5 mg Oral TID  . insulin aspart  0-15 Units Subcutaneous TID WC  . insulin glargine  35 Units Subcutaneous QHS  . ipratropium  0.5 mg Nebulization TID  . levalbuterol  0.63 mg Nebulization TID  . metolazone  5 mg Oral BID  . metoprolol  100 mg Oral BID  . multivitamin with minerals   Oral Daily  . senna  1 tablet Oral BID  . sodium chloride  3 mL Intravenous Q12H    Infusions:    PRN Medications: acetaminophen **OR** acetaminophen, fentaNYL, HYDROcodone-acetaminophen, levalbuterol, meperidine (DEMEROL) injection, midazolam, ondansetron **OR** ondansetron (ZOFRAN) IV, oxyCODONE **OR** oxyCODONE, promethazine   Assessment:  1. Acute decompensated diastolic congestive heart failure, NYHA Class IV symptoms 2. CKD Stage IV 3. Permanent atrial fibrillation/AFL 4. DM2 5. HTN 6. HPL 7. Acute respiratory failure - now on BIPAP  Plan/Discussion:    Diuresing but still volume overloaded with high O2 requirement. Renal managing diuretics (thank you).   AF rate is up with add cardizem 30 q6hr. (stop amlodipine) Continue Eliquis -> may need to switch to heparin soon if creatinine getting worse.   Daniel Bensimhon,MD 11:42 AM  Advanced Heart Failure Team Pager [phone] (M-F; 7a - 4p)  Please contact [facility] for  night-coverage after hours (4p -7a ) and weekends on [email or web address]

## 2014-02-22 NOTE — Progress Notes (Signed)
[place] TEAM 1 - Stepdown/ICU TEAM Progress Note  Joseph [name] KDX:[id number] DOB: 12/20/1955 DOA: 02/20/2014 PCP: Patricia Nettle, MD  Admit HPI / Brief Narrative: 58 yo BM PMHx CKD, chronic atrial fibrillation, and chronic diastolic CHF, has been in atrial fibrillation since 6/13 when he failed a cardioversion and it was decided to leave him in atrial fibrillation. Chronic  Respiratory Failure on 2L O2 @ hm.   He has been on Eliquis. Most recent echo in 4/15 showed EF 97-67% with PA systolic pressure 85 mmHg. Patient is being followed by Dr. Florene Glen from Nephrology as he is known to have chronic kidney disease with a creatinine around 4. The pt presented to the CHF Clinic with volume overload picture, increased O2 demands, and significant dyspnea, so patient was transferred to ED.  In the ED he had a significantly elevated BNP, chest x-ray noted pulmonary edema, and exam noted worsening lower extremity edema, with significant orthopnea.   HPI/Subjective: 11/5 states up until a few days ago was only using his regular home O2; 2 L O2 via [place].  Assessment/Plan: Acute decompensation of chronic diastolic CHF -Most recent echo in April showing EF 34%, with diastolic dysfunction, and severe pulmonary hypertension -per cardiology Lasix 120 mg TID + Metolazone 5 mg BID -CHF Team and Nephrology both assisting w/ care - unclear at this time if HD will be required -strict in and out ; since admission -3.8 L -Daily a.m. Weight; admission weight= 96.8 kg; 11/5 bed weight= 93.7 kg  Severe pulmonary hypertension - see above  Chronic Atrial fibrillation -rate controlled with diltiazem 30 mg QID   -Eliquis for anticoagulation per CHF Team   HTN -slightly above AHA guidelines, however will not adding further medication at this time.  Acute on chronic hypoxic respiratory failure -secondary to volume overload due to his diastolic CHF and progression of his renal disease -now off BIPAP but  requiring high level O2 via FM -Continue Xopenex TID   Chronic kidney disease stage 3 -remains around baseline with a creatinine of 4. -AV graft placed last week -care as per Nephrology   ?RUL infiltrate -no subjective sx to suggest pulmonary infection, apart from dyspnea - will repeat CXR in 2-3 days after diuresed further   Diabetes mellitus -CBG currently well controlled - follow trend -obtain hemoglobin A1c   Hyperlipidemia -Continue with statin -obtain lipid panel    Code Status: FULL Family Communication: no family present at time of exam Disposition Plan: SDY  Consultants: Dr. Jamal Maes (nephrology) Dr. Quillian Quince Bensimhon(cardiology heart failure)  Procedure/Significant Events: 4/30 echocardiogram;Left ventricle: moderate concentric hypertrophy.LVEF= 55% to 60%.  - Pulmonary arteries: peak pressure: 83mm Hg (S).   Culture NA  Antibiotics: NA  DVT prophylaxis: Eliquis   Devices    LINES / TUBES:      Continuous Infusions:   Objective: VITAL SIGNS: Temp: 98.1 F (36.7 C) (11/05 1157) Temp Source: Oral (11/05 1157) BP: 136/71 mmHg (11/05 1157) Pulse Rate: 94 (11/05 1157) SPO2;92% on Ventimask; 14 L/min FIO2:66%   Intake/Output Summary (Last 24 hours) at 02/22/14 1245 Last data filed at 02/22/14 1201  Gross per 24 hour  Intake    740 ml  Output   3175 ml  Net  -2435 ml     Exam: General:A/O x 4, Mod Acute respiratory distress,  Lungs: Poor air movement all lung fields, Mild Expiratory wheezes  Cardiovascular: Iregular Iregular Rhythum and rate, without murmur gallop or rub normal S1 and S2 Abdomen: Nontender, nondistended, soft,  bowel sounds positive, no rebound, no ascites, no appreciable mass Extremities: No significant cyanosis, clubbing, or edema bilateral lower extremities  Data Reviewed: Basic Metabolic Panel:  Recent Labs Lab 02/20/14 1515 02/21/14 0344 02/22/14 0244  NA 142 144 144  K 4.1 3.7 4.1  CL 103 106 105    CO2 21 23 21   GLUCOSE 124* 87 115*  BUN 86* 84* 80*  CREATININE 3.98* 3.94* 3.96*  CALCIUM 9.0 8.6 9.0  PHOS  --   --  4.8*   Liver Function Tests:  Recent Labs Lab 02/22/14 0244  AST 25  ALT 25  ALKPHOS 77  BILITOT 1.0  PROT 6.9  ALBUMIN 2.8*  2.8*   No results for input(s): LIPASE, AMYLASE in the last 168 hours. No results for input(s): AMMONIA in the last 168 hours. CBC:  Recent Labs Lab 02/20/14 1515 02/21/14 0344 02/22/14 0244  WBC 9.3 8.7 9.0  NEUTROABS 7.2  --   --   HGB 9.2* 8.4* 8.8*  HCT 29.4* 27.0* 29.0*  MCV 87.5 87.4 89.8  PLT 296 281 283   Cardiac Enzymes: No results for input(s): CKTOTAL, CKMB, CKMBINDEX, TROPONINI in the last 168 hours. BNP (last 3 results)  Recent Labs  01/15/14 1221 02/20/14 1515  PROBNP 9297.0* 15468.0*   CBG:  Recent Labs Lab 02/21/14 1210 02/21/14 1645 02/21/14 2113 02/22/14 0748 02/22/14 1200  GLUCAP 104* 172* 181* 80 148*    Recent Results (from the past 240 hour(s))  MRSA PCR Screening     Status: None   Collection Time: 02/21/14 12:46 AM  Result Value Ref Range Status   MRSA by PCR NEGATIVE NEGATIVE Final    Comment:        The GeneXpert MRSA Assay (FDA approved for NASAL specimens only), is one component of a comprehensive MRSA colonization surveillance program. It is not intended to diagnose MRSA infection nor to guide or monitor treatment for MRSA infections.      Studies:  Recent x-ray studies have been reviewed in detail by the Attending Physician  Scheduled Meds:  Scheduled Meds: . antiseptic oral rinse  7 mL Mouth Rinse q12n4p  . apixaban  5 mg Oral BID  . atorvastatin  40 mg Oral Daily  . chlorhexidine  15 mL Mouth Rinse BID  . Chlorhexidine Gluconate Cloth  6 each Topical Once   And  . Chlorhexidine Gluconate Cloth  6 each Topical Once  . colchicine  0.3 mg Oral Daily  . darbepoetin (ARANESP) injection - NON-DIALYSIS  100 mcg Subcutaneous Q Wed-1800  . diltiazem  30 mg Oral  4 times per day  . docusate sodium  100 mg Oral BID  . furosemide  120 mg Intravenous Q8H  . hydrALAZINE  37.5 mg Oral TID  . insulin aspart  0-15 Units Subcutaneous TID WC  . insulin glargine  35 Units Subcutaneous QHS  . ipratropium  0.5 mg Nebulization TID  . levalbuterol  0.63 mg Nebulization TID  . metolazone  5 mg Oral BID  . metoprolol  100 mg Oral BID  . multivitamin with minerals   Oral Daily  . senna  1 tablet Oral BID  . sodium chloride  3 mL Intravenous Q12H    Time spent on care of this patient: 40 mins   Allie Bossier , MD   [facility] Office  [phone] Pager - [phone]  On-Call/Text Page:      [email or web address]      password [email or web address]  If 7PM-7AM, please contact  night-coverage [email or web address] Password [email or web address] 02/22/2014, 12:45 PM   LOS: 2 days

## 2014-02-23 ENCOUNTER — Telehealth (HOSPITAL_COMMUNITY): Payer: Self-pay | Admitting: Vascular Surgery

## 2014-02-23 DIAGNOSIS — N184 Chronic kidney disease, stage 4 (severe): Secondary | ICD-10-CM

## 2014-02-23 DIAGNOSIS — I480 Paroxysmal atrial fibrillation: Secondary | ICD-10-CM

## 2014-02-23 LAB — HEMOGLOBIN A1C
HEMOGLOBIN A1C: 6.2 % — AB (ref <5.7)
MEAN PLASMA GLUCOSE: 131 mg/dL — AB (ref <117)

## 2014-02-23 LAB — LIPID PANEL
CHOL/HDL RATIO: 3.7 ratio
Cholesterol: 99 mg/dL (ref 0–200)
HDL: 27 mg/dL — ABNORMAL LOW (ref >39)
LDL Cholesterol: 52 mg/dL (ref 0–99)
Triglycerides: 101 mg/dL (ref <150)
VLDL: 20 mg/dL (ref 0–40)

## 2014-02-23 LAB — GLUCOSE, CAPILLARY
GLUCOSE-CAPILLARY: 111 mg/dL — AB (ref 70–99)
GLUCOSE-CAPILLARY: 176 mg/dL — AB (ref 70–99)
Glucose-Capillary: 230 mg/dL — ABNORMAL HIGH (ref 70–99)
Glucose-Capillary: 130 mg/dL — ABNORMAL HIGH (ref 70–99)

## 2014-02-23 LAB — BASIC METABOLIC PANEL
ANION GAP: 16 — AB (ref 5–15)
BUN: 89 mg/dL — ABNORMAL HIGH (ref 6–23)
CALCIUM: 8.9 mg/dL (ref 8.4–10.5)
CO2: 25 mEq/L (ref 19–32)
CREATININE: 4.73 mg/dL — AB (ref 0.50–1.35)
Chloride: 101 mEq/L (ref 96–112)
GFR calc Af Amer: 14 mL/min — ABNORMAL LOW (ref >90)
GFR, EST NON AFRICAN AMERICAN: 12 mL/min — AB (ref >90)
Glucose, Bld: 136 mg/dL — ABNORMAL HIGH (ref 70–99)
Potassium: 3.9 mEq/L (ref 3.7–5.3)
SODIUM: 142 meq/L (ref 137–147)

## 2014-02-23 LAB — MAGNESIUM: MAGNESIUM: 2.2 mg/dL (ref 1.5–2.5)

## 2014-02-23 MED ORDER — HYDRALAZINE HCL 50 MG PO TABS
50.0000 mg | ORAL_TABLET | Freq: Three times a day (TID) | ORAL | Status: DC
  Administered 2014-02-23 – 2014-03-03 (×25): 50 mg via ORAL
  Filled 2014-02-23 (×27): qty 1

## 2014-02-23 MED ORDER — FUROSEMIDE 10 MG/ML IJ SOLN
160.0000 mg | Freq: Four times a day (QID) | INTRAVENOUS | Status: DC
  Administered 2014-02-23 – 2014-02-26 (×11): 160 mg via INTRAVENOUS
  Filled 2014-02-23 (×16): qty 16

## 2014-02-23 MED ORDER — DILTIAZEM HCL ER COATED BEADS 120 MG PO CP24
120.0000 mg | ORAL_CAPSULE | Freq: Every day | ORAL | Status: DC
  Administered 2014-02-23 – 2014-03-03 (×9): 120 mg via ORAL
  Filled 2014-02-23 (×9): qty 1

## 2014-02-23 MED ORDER — SODIUM CHLORIDE 0.9 % IV SOLN
510.0000 mg | INTRAVENOUS | Status: AC
  Administered 2014-02-23 – 2014-02-26 (×2): 510 mg via INTRAVENOUS
  Filled 2014-02-23 (×3): qty 17

## 2014-02-23 MED ORDER — CALCITRIOL 0.25 MCG PO CAPS
0.2500 ug | ORAL_CAPSULE | ORAL | Status: DC
  Administered 2014-02-23 – 2014-03-02 (×4): 0.25 ug via ORAL
  Filled 2014-02-23 (×4): qty 1

## 2014-02-23 NOTE — Progress Notes (Signed)
[facility] Rounding Note Subjective:  Feeling better but still DOE Excellent diuretic response with UOP 3.4 liters first 24 hours but has dropped off last 24 hours and creatinine is rising as well (not unexpected) Pt feels left arm also less swollen  Objective Vital signs in last 24 hours: Filed Vitals:   02/23/14 0400 02/23/14 0625 02/23/14 0739 02/23/14 0800  BP: 148/108 126/65 150/71 147/71  Pulse: 75  56 79  Temp:   99 F (37.2 C) 98.6 F (37 C)  TempSrc:   Oral Oral  Resp: 21  18 18   Height:      Weight:      SpO2: 95%  93% 93%   Weight change: -3.3 kg (-7 lb 4.4 oz)  Intake/Output Summary (Last 24 hours) at 02/23/14 0847 Last data filed at 02/23/14 0704  Gross per 24 hour  Intake   1170 ml  Output   2600 ml  Net  -1430 ml   Physical Exam:  BP 147/71 mmHg  Pulse 79  Temp(Src) 98.6 F (37 C) (Oral)  Resp 18  Ht 6' (1.829 m)  Wt 90.4 kg (199 lb 4.7 oz)  BMI 27.02 kg/m2  SpO2 93%  General appearance: alert and cooperative Still appears dyspneic at rest Head: Normocephalic, without obvious abnormality, atraumatic, wearing ventimask + JVD to jaw angle Resp: diminished breath sounds bilaterally still with crackles Cardio:Irregularly irregular rate 100's, S1, S2 normal, no murmur, click, rub or gallop GI: Mild distension, protuberant. Soft. No focal tenderness. Liver edge down Small umbilical hernia Extremities: edema 1+ pretibial Left FA AVG + bruit with perhaps sl less arm edema. Incision remains clean and dry Neurologic: Grossly normal  Weight Trending  11/4 94.1 kg 11/5 93.7 11/6 90.4  Labs: Basic Metabolic Panel:  Recent Labs Lab 02/20/14 1515 02/21/14 0344 02/22/14 0244 02/23/14 0547  NA 142 144 144 142  K 4.1 3.7 4.1 3.9  CL 103 106 105 101  CO2 21 23 21 25   GLUCOSE 124* 87 115* 136*  BUN 86* 84* 80* 89*  CREATININE 3.98* 3.94* 3.96* 4.73*  CALCIUM 9.0 8.6 9.0 8.9  PHOS  --   --  4.8*  --     Recent Labs Lab  02/20/14 1515 02/21/14 0344 02/22/14 0244  WBC 9.3 8.7 9.0  NEUTROABS 7.2  --   --   HGB 9.2* 8.4* 8.8*  HCT 29.4* 27.0* 29.0*  MCV 87.5 87.4 89.8  PLT 296 281 283    Recent Labs Lab 02/22/14 0748 02/22/14 1200 02/22/14 1644 02/22/14 2106 02/23/14 0738  GLUCAP 80 148* 188* 235* 111*   Results for Joseph [name], Joseph [name] (MRN [id number]) as of 02/23/2014 08:51  Ref. Range 02/22/2014 02:44  Iron Latest Range: 42-135 ug/dL 22 (L)  UIBC Latest Range: 125-400 ug/dL 247  TIBC Latest Range: 215-435 ug/dL 269  Saturation Ratios Latest Range: 20-55 % 8 (L)   Results for Joseph [name], Joseph [name] (MRN [id number]) as of 02/23/2014 08:51  Ref. Range 02/21/2014 13:32  PTH Latest Range: 14-64 pg/mL 248 (H)    Studies/Results: No results found. Medications:   . antiseptic oral rinse  7 mL Mouth Rinse q12n4p  . apixaban  5 mg Oral BID  . atorvastatin  40 mg Oral Daily  . chlorhexidine  15 mL Mouth Rinse BID  . colchicine  0.3 mg Oral Daily  . darbepoetin (ARANESP) injection - NON-DIALYSIS  100 mcg Subcutaneous Q Wed-1800  . diltiazem  30 mg Oral 4 times per day  .  docusate sodium  100 mg Oral BID  . furosemide  160 mg Intravenous Q6H  . hydrALAZINE  50 mg Oral TID  . insulin aspart  0-15 Units Subcutaneous TID WC  . insulin glargine  35 Units Subcutaneous QHS  . ipratropium  0.5 mg Nebulization TID  . levalbuterol  0.63 mg Nebulization TID  . metolazone  5 mg Oral BID  . metoprolol  100 mg Oral BID  . multivitamin with minerals   Oral Daily  . senna  1 tablet Oral BID  . sodium chloride  3 mL Intravenous Q12H    BACKGROUND 58 yo diabetic male with a history of stage 4 CKD prob due to diabetes mellitus, (followed by Dr. Florene Glen) s/p L FA AVG 10/27, chronic atrial fibrillation Rx apixiban, and chronic diastolic CHF.He is admitted on this occasion with decompenstaed CHF. He reports medication compliance but it was Homecoming Weekend" so perhaps some dietary indiscretion. Has had some challenges  recently with fluid overload. Echo in April showed a 55-60% EF/ pulm HTN with elevated PA pressures 80's. Baseline renal function - creatinines most recently in the 4-5.5 range (4.25 10/2013, 5.57 01/15/14, 3.98 on admission 11/3).  Nephrology asked to see for diuretic management.  Assessment/Recommdations   Stage 4/5 CKD d/t DM -  S/P left FA AVG 10/27. Creatinine starting to rise in face of diuresis  CHF/Vol overload - on IV lasix 120 TID plus metolazone 5 mg BID and diuretic response a little less last 24 hours. Increase Lasix to 160 Q6H. I am hoping we can get him out of trouble with the diuretics and not have to initiate dialysis until his graft is mature (just placed 10/27) but if he cannot maintain good diuretic response on escalated doses of lasix and metolazone may need a tunnelled catheter and initiation of HD - the next 2-3 days will tell the story                   R/o PNA - will need repeat CXR after diuresis to reevaluate RUL abnl  Pulmonary hypertension  Permanent atrial fibrillation - on Eliquis. Cardizem added for rate control yesterday  Anemia - Started Aranesp 100/week Tawny Asal). 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 Start low dose calcitriol;  phosphorus is fine (4.8)   Jamal Maes, MD [facility] [phone] pager 02/23/2014, 8:47 AM

## 2014-02-23 NOTE — Telephone Encounter (Signed)
Per Dr Aundra Dubin she needs to speak to kidney MDs as they are the ones managing meds and fluid status, pt's wife is aware

## 2014-02-23 NOTE — Progress Notes (Signed)
Advanced Heart Failure Rounding Note   Subjective:   PCP: Dr. Montez Morita Referring MD: Dr. Caryl Comes Nephrology: Dr Florene Glen   58 yo with history of CKD, chronic atrial fibrillation, and chronic diastolic CHF was referred to CHF clinic for evaluation of CHF and elevated PA pressure on echo. Patient's creatinine has been as high as 4.9, probably due to HTN and diabetes. He follows with Dr. Florene Glen. He has been in atrial fibrillation since 6/13 when he failed a cardioversion and it was decided to leave him in atrial fibrillation. He has been on Eliquis. Most recent echo in 4/15 showed EF 10-93% with PA systolic pressure 85 mmHg. He has had difficulty recently with volume overload.   RHC in 6/15 showing elevated right and left heart filling pressures with moderate pulmonary hypertension, primarily pulmonary venous hypertension.   Admitted with decompensated HF and progressive renal failure. Placed on lasix 120 mg IV three times a day. Metolazone added. Weight down 8 pounds. Still requiring high flow O2  Creatinine trending up table at 3.9>4.73   Objective:   Weight Range:  Vital Signs:   Temp:  [97.7 F (36.5 C)-99.7 F (37.6 C)] 99.7 F (37.6 C) (11/06 0339) Pulse Rate:  [35-110] 75 (11/06 0400) Resp:  [12-31] 21 (11/06 0400) BP: (126-167)/(65-108) 126/65 mmHg (11/06 0625) SpO2:  [86 %-97 %] 95 % (11/06 0400) FiO2 (%):  [55 %-66 %] 55 % (11/06 0400) Weight:  [199 lb 4.7 oz (90.4 kg)] 199 lb 4.7 oz (90.4 kg) (11/06 0339)    Weight change: Filed Weights   02/21/14 0040 02/22/14 0300 02/23/14 0339  Weight: 207 lb 7.3 oz (94.1 kg) 206 lb 9.1 oz (93.7 kg) 199 lb 4.7 oz (90.4 kg)    Intake/Output:   Intake/Output Summary (Last 24 hours) at 02/23/14 0738 Last data filed at 02/23/14 0704  Gross per 24 hour  Intake   1170 ml  Output   2600 ml  Net  -1430 ml     Physical Exam: General:  Lying in bed on FM O2. +dyspneic HEENT: normal Neck: supple. JVP to jaw  Carotids 2+ bilat;  no bruits. No lymphadenopathy or thryomegaly appreciated. Cor: PMI nondisplaced. Irregular rate & rhythm. No rubs, gallops or murmurs. Lungs: clear On BiPaP  Abdomen: soft, nontender, distended. No hepatosplenomegaly. No bruits or masses. Good bowel sounds. Extremities: no cyanosis, clubbing, rash, R and LLE 1+ edema Neuro: alert & orientedx3, cranial nerves grossly intact. moves all 4 extremities w/o difficulty. Affect pleasant  Telemetry: A flutter 100-115  Labs: Basic Metabolic Panel:  Recent Labs Lab 02/20/14 1515 02/21/14 0344 02/22/14 0244 02/23/14 0547  NA 142 144 144 142  K 4.1 3.7 4.1 3.9  CL 103 106 105 101  CO2 21 23 21 25   GLUCOSE 124* 87 115* 136*  BUN 86* 84* 80* 89*  CREATININE 3.98* 3.94* 3.96* 4.73*  CALCIUM 9.0 8.6 9.0 8.9  MG  --   --   --  2.2  PHOS  --   --  4.8*  --     Liver Function Tests:  Recent Labs Lab 02/22/14 0244  AST 25  ALT 25  ALKPHOS 77  BILITOT 1.0  PROT 6.9  ALBUMIN 2.8*  2.8*   No results for input(s): LIPASE, AMYLASE in the last 168 hours. No results for input(s): AMMONIA in the last 168 hours.  CBC:  Recent Labs Lab 02/20/14 1515 02/21/14 0344 02/22/14 0244  WBC 9.3 8.7 9.0  NEUTROABS 7.2  --   --  HGB 9.2* 8.4* 8.8*  HCT 29.4* 27.0* 29.0*  MCV 87.5 87.4 89.8  PLT 296 281 283    Cardiac Enzymes: No results for input(s): CKTOTAL, CKMB, CKMBINDEX, TROPONINI in the last 168 hours.  BNP: BNP (last 3 results)  Recent Labs  01/15/14 1221 02/20/14 1515  PROBNP 9297.0* 15468.0*     Other results:  EKG:   Imaging: No results found.   Medications:     Scheduled Medications: . antiseptic oral rinse  7 mL Mouth Rinse q12n4p  . apixaban  5 mg Oral BID  . atorvastatin  40 mg Oral Daily  . chlorhexidine  15 mL Mouth Rinse BID  . Chlorhexidine Gluconate Cloth  6 each Topical Once   And  . Chlorhexidine Gluconate Cloth  6 each Topical Once  . colchicine  0.3 mg Oral Daily  . darbepoetin (ARANESP)  injection - NON-DIALYSIS  100 mcg Subcutaneous Q Wed-1800  . diltiazem  30 mg Oral 4 times per day  . docusate sodium  100 mg Oral BID  . furosemide  120 mg Intravenous Q8H  . hydrALAZINE  37.5 mg Oral TID  . insulin aspart  0-15 Units Subcutaneous TID WC  . insulin glargine  35 Units Subcutaneous QHS  . ipratropium  0.5 mg Nebulization TID  . levalbuterol  0.63 mg Nebulization TID  . metolazone  5 mg Oral BID  . metoprolol  100 mg Oral BID  . multivitamin with minerals   Oral Daily  . senna  1 tablet Oral BID  . sodium chloride  3 mL Intravenous Q12H    Infusions:    PRN Medications: acetaminophen **OR** acetaminophen, fentaNYL, HYDROcodone-acetaminophen, levalbuterol, meperidine (DEMEROL) injection, ondansetron **OR** ondansetron (ZOFRAN) IV, promethazine   Assessment:  1. Acute decompensated diastolic congestive heart failure, NYHA Class IV symptoms 2. CKD Stage IV 3. Permanent atrial fibrillation/AFL 4. DM2 5. HTN 6. HPL 7. Acute respiratory failure - now on BIPAP  Plan/Discussion:    Diuresing but still volume overloaded with high O2 requirement. Renal managing diuretics (thank you).   AF rate controlled with cardizem 30 q6hr.  Continue Eliquis -> may need to switch to heparin soon if creatinine getting worse.   Increase hydralazine to 50 mg three times days.  CLEGG,AMY,NP-C  7:38 AM  Advanced Heart Failure Team Pager [phone] (M-F; [place])  Please contact [facility] for night-coverage after hours (4p -7a ) and weekends on [email or web address]  Patient seen with NP, agree with the above note.  Reasonable UOP but creatinine up again.  Still volume overloaded.  Continue diuretics per renal.  Hoping to avoid HD until fistula matures.  Will consolidate diltiazem today to diltiazem CD.   [name] 02/23/2014 8:00 AM

## 2014-02-23 NOTE — Progress Notes (Signed)
Patient ID: Joseph [name], male   DOB: [birth year], 58 y.o.   MRN: [id number]  [facility] PROGRESS NOTE  Joseph [name]:[id number] DOB: 1955-11-24 DOA: 02/20/2014 PCP: Joseph Nettle, MD  Brief narrative: 58 yo BM PMHx CKD stage III with baseline Cr ~ 4 (follows with Dr. Florene [name]), chronic diastolic CHF, chronic atrial fibrillation since 6/13, failed cardioversion (on Eliquis), most recent 2 D ECHO with EF 55 - 60 %. Pt presented to [facility] with dyspnea and noted to be volume overloaded with increased oxygen demand, CXR consistent with pulmonary edema.   Assessment and Plan:   Acute decompensation of chronic diastolic CHF - currently on Lasix 120 mg TID + Metolazone 5 mg BID - good response to diuretic with weight trend: 207 lbs -- > 206 lbs --> 199 lbs - appreciate nephrology and cardiology assistance  - pr clinically improving and reports less dyspnea this AM CKD, stage IV - V - S/P left FA AVG 10/27 - Creatinine increasing secondary to diuresis: 3.94 --> 3.96 --> 4.73 - continue to monitor and repeat BMP in AM Acute on chronic hypoxic respiratory failure - multifactorial and secondary to volume overload due to diastolic CHF and progression of his renal disease, atrial fibrillation  - now off BIPAP but requiring oxygen via [place], maintaining oxygen saturation at target range  - Continue BD's  - plan on repeat CXr to evaluate for RUL infiltrate, r/o PNA Chronic atrial fibrillation - rate controlled on Cardizem 120 mg PO QD - continue to monitor on telemetry  - continue Eliquis  Severe pulmonary HTN - noted on recent 2 D ECHO  HTN - stable this AM, 133/84 - continue Cardizem, lasix, metoprolol, hydralazine  Anemia of chronic kidney disease - started aransep per nephrology team - Hg stable at 8.8 this AM, repeat CBC in AM ? RUL infiltrate - will repeat CXR in next 24 - 48 hours to follow up on resolution  Diabetes mellitus - CBG currently well controlled - follow  trend Hyperlipidemia - Continue with statin   DVT prophylaxis  Eliquis   Code Status: Full Family Communication: Pt at bedside Disposition Plan: Home when medically stable  IV Access:   Peripheral IV Procedures and diagnostic studies:    4/30 echocardiogram;Left ventricle: moderate concentric hypertrophy.LVEF= 55% to 60%. Pulmonary arteries: peak pressure: 8mm Hg (S). Medical Consultants:   Nephrology Cardiology  Other Consultants:   None   Anti-Infectives:   None   Joseph Ramsay, MD  Joseph [name] Pager [phone]  If 7PM-7AM, please contact night-coverage [email or web address] Password [email or web address] 02/23/2014, 3:01 PM   LOS: 3 days   HPI/Subjective: No events overnight.   Objective: Filed Vitals:   02/23/14 0908 02/23/14 1046 02/23/14 1208 02/23/14 1421  BP:  147/63 125/67 133/84  Pulse:   89   Temp:   98.4 F (36.9 C)   TempSrc:   Oral   Resp:   25   Height:      Weight:      SpO2: 96%  94%     Intake/Output Summary (Last 24 hours) at 02/23/14 1501 Last data filed at 02/23/14 1100  Gross per 24 hour  Intake    780 ml  Output   2050 ml  Net  -1270 ml    Exam:   General:  Pt is alert, follows commands appropriately, not in acute distress  Cardiovascular: Irregular rate and rhythm, no rubs, no gallops  Respiratory: Diminished air movement bilaterally, crackles at bases   Abdomen: Soft, non tender,  non distended, bowel sounds present, no guarding  Extremities: +2 bilateral LE edema, pulses DP and PT palpable bilaterally  Neuro: Grossly nonfocal  Data Reviewed: Basic Metabolic Panel:  Recent Labs Lab 02/20/14 1515 02/21/14 0344 02/22/14 0244 02/23/14 0547  NA 142 144 144 142  K 4.1 3.7 4.1 3.9  CL 103 106 105 101  CO2 21 23 21 25   GLUCOSE 124* 87 115* 136*  BUN 86* 84* 80* 89*  CREATININE 3.98* 3.94* 3.96* 4.73*  CALCIUM 9.0 8.6 9.0 8.9  MG  --   --   --  2.2  PHOS  --   --  4.8*  --    Liver Function Tests:  Recent Labs Lab 02/22/14 0244   AST 25  ALT 25  ALKPHOS 77  BILITOT 1.0  PROT 6.9  ALBUMIN 2.8*  2.8*   CBC:  Recent Labs Lab 02/20/14 1515 02/21/14 0344 02/22/14 0244  WBC 9.3 8.7 9.0  NEUTROABS 7.2  --   --   HGB 9.2* 8.4* 8.8*  HCT 29.4* 27.0* 29.0*  MCV 87.5 87.4 89.8  PLT 296 281 283   CBG:  Recent Labs Lab 02/22/14 1200 02/22/14 1644 02/22/14 2106 02/23/14 0738 02/23/14 1211  GLUCAP 148* 188* 235* 111* 230*    Recent Results (from the past 240 hour(s))  MRSA PCR Screening     Status: None   Collection Time: 02/21/14 12:46 AM  Result Value Ref Range Status   MRSA by PCR NEGATIVE NEGATIVE Final    Comment:        The GeneXpert MRSA Assay (FDA approved for NASAL specimens only), is one component of a comprehensive MRSA colonization surveillance program. It is not intended to diagnose MRSA infection nor to guide or monitor treatment for MRSA infections.      Scheduled Meds: . antiseptic oral rinse  7 mL Mouth Rinse q12n4p  . apixaban  5 mg Oral BID  . atorvastatin  40 mg Oral Daily  . calcitRIOL  0.25 mcg Oral Q M,W,F  . chlorhexidine  15 mL Mouth Rinse BID  . colchicine  0.3 mg Oral Daily  . darbepoetin (ARANESP) injection - NON-DIALYSIS  100 mcg Subcutaneous Q Wed-1800  . diltiazem  120 mg Oral Daily  . docusate sodium  100 mg Oral BID  . ferumoxytol  510 mg Intravenous Q3 days  . furosemide  160 mg Intravenous Q6H  . hydrALAZINE  50 mg Oral TID  . insulin aspart  0-15 Units Subcutaneous TID WC  . insulin glargine  35 Units Subcutaneous QHS  . ipratropium  0.5 mg Nebulization TID  . levalbuterol  0.63 mg Nebulization TID  . metolazone  5 mg Oral BID  . metoprolol  100 mg Oral BID  . multivitamin with minerals   Oral Daily  . senna  1 tablet Oral BID  . sodium chloride  3 mL Intravenous Q12H   Continuous Infusions:

## 2014-02-23 NOTE — Telephone Encounter (Signed)
PT wife called pt is in the hospital wife wants to talk to Dr. Haroldine Laws about her husbands conditions.. please dvise

## 2014-02-24 ENCOUNTER — Inpatient Hospital Stay (HOSPITAL_COMMUNITY): Payer: 59

## 2014-02-24 DIAGNOSIS — J9621 Acute and chronic respiratory failure with hypoxia: Secondary | ICD-10-CM

## 2014-02-24 DIAGNOSIS — E119 Type 2 diabetes mellitus without complications: Secondary | ICD-10-CM

## 2014-02-24 LAB — CBC
HCT: 27.1 % — ABNORMAL LOW (ref 39.0–52.0)
Hemoglobin: 8.3 g/dL — ABNORMAL LOW (ref 13.0–17.0)
MCH: 26.6 pg (ref 26.0–34.0)
MCHC: 30.6 g/dL (ref 30.0–36.0)
MCV: 86.9 fL (ref 78.0–100.0)
Platelets: 253 10*3/uL (ref 150–400)
RBC: 3.12 MIL/uL — AB (ref 4.22–5.81)
RDW: 18.1 % — ABNORMAL HIGH (ref 11.5–15.5)
WBC: 8.3 10*3/uL (ref 4.0–10.5)

## 2014-02-24 LAB — RENAL FUNCTION PANEL
ANION GAP: 19 — AB (ref 5–15)
Albumin: 2.6 g/dL — ABNORMAL LOW (ref 3.5–5.2)
BUN: 92 mg/dL — ABNORMAL HIGH (ref 6–23)
CALCIUM: 8.8 mg/dL (ref 8.4–10.5)
CO2: 23 mEq/L (ref 19–32)
Chloride: 96 mEq/L (ref 96–112)
Creatinine, Ser: 4.75 mg/dL — ABNORMAL HIGH (ref 0.50–1.35)
GFR calc Af Amer: 14 mL/min — ABNORMAL LOW (ref >90)
GFR calc non Af Amer: 12 mL/min — ABNORMAL LOW (ref >90)
GLUCOSE: 52 mg/dL — AB (ref 70–99)
Phosphorus: 5.1 mg/dL — ABNORMAL HIGH (ref 2.3–4.6)
Potassium: 3.8 mEq/L (ref 3.7–5.3)
SODIUM: 138 meq/L (ref 137–147)

## 2014-02-24 LAB — GLUCOSE, CAPILLARY
GLUCOSE-CAPILLARY: 50 mg/dL — AB (ref 70–99)
GLUCOSE-CAPILLARY: 133 mg/dL — AB (ref 70–99)
Glucose-Capillary: 92 mg/dL (ref 70–99)
Glucose-Capillary: 81 mg/dL (ref 70–99)
Glucose-Capillary: 239 mg/dL — ABNORMAL HIGH (ref 70–99)
Glucose-Capillary: 202 mg/dL — ABNORMAL HIGH (ref 70–99)

## 2014-02-24 LAB — PRO B NATRIURETIC PEPTIDE: Pro B Natriuretic peptide (BNP): 10410 pg/mL — ABNORMAL HIGH (ref 0–125)

## 2014-02-24 MED ORDER — IPRATROPIUM BROMIDE 0.02 % IN SOLN
0.5000 mg | Freq: Two times a day (BID) | RESPIRATORY_TRACT | Status: DC
  Administered 2014-02-24 – 2014-03-01 (×9): 0.5 mg via RESPIRATORY_TRACT
  Filled 2014-02-24 (×10): qty 2.5

## 2014-02-24 MED ORDER — LEVALBUTEROL HCL 0.63 MG/3ML IN NEBU
0.6300 mg | INHALATION_SOLUTION | Freq: Two times a day (BID) | RESPIRATORY_TRACT | Status: DC
  Administered 2014-02-24 – 2014-03-02 (×11): 0.63 mg via RESPIRATORY_TRACT
  Filled 2014-02-24 (×25): qty 3

## 2014-02-24 NOTE — Progress Notes (Signed)
Labs this morning showing glucose of 52. Checked glucose on floor and reading was 50. Orange juice x 2 given PO. Rechecked glucose and reading was 92. Patient stated he feels much better.  Will continue to monitor closely.

## 2014-02-24 NOTE — Progress Notes (Addendum)
Patient ID: Joseph [name], male   DOB: Jul 09, 1955, 58 y.o.   MRN: [id number]  [facility] PROGRESS NOTE  Joseph [name] HTD:[id number] DOB: [birth year] DOA: 02/20/2014 PCP: Patricia Nettle, MD  Brief narrative: 58 yo BM PMHx CKD stage III with baseline Cr ~ 4 (follows with Dr. Florene Glen), chronic diastolic CHF, chronic atrial fibrillation since 6/13, failed cardioversion (on Eliquis), most recent 2 D ECHO with EF 55 - 60 %. Pt presented to [facility] with dyspnea and noted to be volume overloaded with increased oxygen demand, CXR consistent with pulmonary edema.   Assessment and Plan:   Acute decompensation of chronic diastolic CHF - currently on Lasix 160 mg IV Q6 + Metolazone 5 mg PO BID - good response to diuretic with weight trend: 207 lbs -- > 206 lbs --> 199 lbs --> 198 lbs this AM - appreciate nephrology and cardiology assistance  - pr clinically improving and reports less dyspnea this AM, still on Ventimask  CKD, stage IV - V - S/P left FA AVG 10/27 - Creatinine increasing secondary to diuresis: 3.94 --> 3.96 --> 4.73 --> 4.75 - continue to monitor and repeat BMP in AM Acute on chronic hypoxic respiratory failure - multifactorial and secondary to volume overload due to diastolic CHF and progression of his renal disease, atrial fibrillation  - now off BIPAP but requiring oxygen via Ventimask, maintaining oxygen saturation at target range  - Continue BD's  - repeat CXR this AM, no RUL opacity noted as previously reported on initial CXR  Chronic atrial fibrillation - rate controlled on Cardizem 120 mg PO QD - monitor on telemetry  - continue Eliquis  Severe pulmonary HTN - noted on recent 2 D ECHO  HTN - stable this AM, 130's/60's - currently on Lasix 160 mg IV Q6, Cardizem 120 PO QD, Hydralazine 50 mg PO TID, Metoprolol 100 mg PO BID Anemia of chronic kidney disease - started aransep per nephrology team - Hg stable ~8 ? RUL infiltrate - repeat CXR with no focal opacity in the  RUL noted  Diabetes mellitus - CBG currently well controlled, <100 over the past 24 hours  - continue Lantus along with SSI  Hyperlipidemia - Continue with statin  DVT prophylaxis  Eliquis  Code Status: Full Family Communication: Pt at bedside Disposition Plan: Home when medically stable  IV Access:    Peripheral IV Procedures and diagnostic studies:    4/30 echocardiogram;Left ventricle: moderate concentric hypertrophy.LVEF= 55% to 60%. Pulmonary arteries: peak pressure: 85mm Hg (S). Dg Chest 2 View  02/24/2014 Pulmonary edema and small pleural effusions. Previously noted focal opacity in the right upper lobe is no longer visible.    Medical Consultants:    Nephrology  Cardiology  Other Consultants:    None  Anti-Infectives:    None  Faye Ramsay, MD  [facility] Pager [phone]  If 7PM-7AM, please contact night-coverage [email or web address] Password [email or web address] 02/24/2014, 11:29 AM   LOS: 4 days   HPI/Subjective: No events overnight.   Objective: Filed Vitals:   02/24/14 0343 02/24/14 0751 02/24/14 0800 02/24/14 0805  BP: 134/64 133/72 136/67   Pulse: 74 74 68   Temp: 98.5 F (36.9 C) 98.1 F (36.7 C)    TempSrc: Oral Other (Comment)    Resp: 20 20 19 20   Height:      Weight: 90.1 kg (198 lb 10.2 oz)     SpO2: 90% 94% 95% 94%    Intake/Output Summary (Last 24 hours) at 02/24/14 1129 Last data filed at  02/24/14 0952  Gross per 24 hour  Intake    200 ml  Output   3550 ml  Net  -3350 ml    Exam:   General:  Pt is alert, follows commands appropriately, not in acute distress  Cardiovascular: Irregular rate and rhythm, no rubs, no gallops  Respiratory: Clear to auscultation bilaterally, no wheezing, crackles at bases   Abdomen: Soft, non tender, non distended, bowel sounds present, no guarding  Extremities:  pulses DP and PT palpable bilaterally, LE bilateral pitting edema 1+, left FA AVG + bruit withless arm edema  Neuro: Grossly  nonfocal  Data Reviewed: Basic Metabolic Panel:  Recent Labs Lab 02/20/14 1515 02/21/14 0344 02/22/14 0244 02/23/14 0547 02/24/14 0230  NA 142 144 144 142 138  K 4.1 3.7 4.1 3.9 3.8  CL 103 106 105 101 96  CO2 21 23 21 25 23   GLUCOSE 124* 87 115* 136* 52*  BUN 86* 84* 80* 89* 92*  CREATININE 3.98* 3.94* 3.96* 4.73* 4.75*  CALCIUM 9.0 8.6 9.0 8.9 8.8  MG  --   --   --  2.2  --   PHOS  --   --  4.8*  --  5.1*   Liver Function Tests:  Recent Labs Lab 02/22/14 0244 02/24/14 0230  AST 25  --   ALT 25  --   ALKPHOS 77  --   BILITOT 1.0  --   PROT 6.9  --   ALBUMIN 2.8*  2.8* 2.6*   CBC:  Recent Labs Lab 02/20/14 1515 02/21/14 0344 02/22/14 0244 02/24/14 0230  WBC 9.3 8.7 9.0 8.3  NEUTROABS 7.2  --   --   --   HGB 9.2* 8.4* 8.8* 8.3*  HCT 29.4* 27.0* 29.0* 27.1*  MCV 87.5 87.4 89.8 86.9  PLT 296 281 283 253   CBG:  Recent Labs Lab 02/23/14 1637 02/23/14 2212 02/24/14 0559 02/24/14 0631 02/24/14 0753  GLUCAP 176* 130* 50* 92 81    Recent Results (from the past 240 hour(s))  MRSA PCR Screening     Status: None   Collection Time: 02/21/14 12:46 AM  Result Value Ref Range Status   MRSA by PCR NEGATIVE NEGATIVE Final    Comment:        The GeneXpert MRSA Assay (FDA approved for NASAL specimens only), is one component of a comprehensive MRSA colonization surveillance program. It is not intended to diagnose MRSA infection nor to guide or monitor treatment for MRSA infections.      Scheduled Meds: . apixaban  5 mg Oral BID  . atorvastatin  40 mg Oral Daily  . calcitRIOL  0.25 mcg Oral Q M,W,F  . colchicine  0.3 mg Oral Daily  . diltiazem  120 mg Oral Daily  . docusate sodium  100 mg Oral BID  . ferumoxytol  510 mg Intravenous Q3 days  . furosemide  160 mg Intravenous Q6H  . hydrALAZINE  50 mg Oral TID  . insulin aspart  0-15 Units Subcutaneous TID WC  . insulin glargine  35 Units Subcutaneous QHS  . ipratropium  0.5 mg Nebulization TID   . levalbuterol  0.63 mg Nebulization TID  . metolazone  5 mg Oral BID  . metoprolol  100 mg Oral BID   Continuous Infusions:

## 2014-02-24 NOTE — Progress Notes (Signed)
Progress Note   Subjective:    58 yo with history of CKD, chronic atrial fibrillation, and chronic diastolic CHF was referred to CHF clinic for evaluation of CHF and elevated PA pressure on echo. Patient's creatinine has been as high as 4.9, probably due to HTN and diabetes. He follows with Dr. Florene Glen. He has been in atrial fibrillation since 6/13 when he failed a cardioversion and it was decided to leave him in atrial fibrillation. He has been on Eliquis. Most recent echo in 4/15 showed EF 93-81% with PA systolic pressure 85 mmHg. He has had difficulty recently with volume overload.   RHC in 6/15 showing elevated right and left heart filling pressures with moderate pulmonary hypertension, primarily pulmonary venous hypertension.   Admitted with decompensated HF and progressive renal failure. Placed on lasix 120 mg IV three times a day. Metolazone added. Weight down 8 pounds. Still requiring high flow O2  Creatinine trending up table at 3.9>4.73--> 4.75  The patient states that he feels much better. Eating breakfast, looking comfortable.  Objective:    Vital Signs:   Temp:  [98 F (36.7 C)-98.6 F (37 C)] 98.1 F (36.7 C) (11/07 0751) Pulse Rate:  [70-89] 74 (11/07 0751) Resp:  [17-25] 20 (11/07 0751) BP: (124-147)/(62-84) 133/72 mmHg (11/07 0751) SpO2:  [90 %-99 %] 95 % (11/07 0800) FiO2 (%):  [50 %-55 %] 55 % (11/07 0800) Weight:  [198 lb 10.2 oz (90.1 kg)] 198 lb 10.2 oz (90.1 kg) (11/07 0343) Last BM Date: 02/22/14  Weight change: Filed Weights   02/22/14 0300 02/23/14 0339 02/24/14 0343  Weight: 206 lb 9.1 oz (93.7 kg) 199 lb 4.7 oz (90.4 kg) 198 lb 10.2 oz (90.1 kg)   Intake/Output:   Intake/Output Summary (Last 24 hours) at 02/24/14 0818 Last data filed at 02/24/14 0755  Gross per 24 hour  Intake      0 ml  Output   3175 ml  Net  -3175 ml     Physical Exam: General:  Lying in bed on FM O2. +dyspneic HEENT: normal Neck: supple. JVP+ 4. Carotids 2+  bilat; no bruits. No lymphadenopathy or thryomegaly appreciated. Cor: PMI nondisplaced. Irregular rate & rhythm. No rubs, gallops or murmurs. Lungs: clear On BiPaP  Abdomen: soft, nontender, distended. No hepatosplenomegaly. No bruits or masses. Good bowel sounds. Extremities: no cyanosis, clubbing, rash, R and LLE trace edema Neuro: alert & orientedx3, cranial nerves grossly intact. moves all 4 extremities w/o difficulty. Affect pleasant  Telemetry: A flutter 100-115  Labs: Basic Metabolic Panel:  Recent Labs Lab 02/20/14 1515 02/21/14 0344 02/22/14 0244 02/23/14 0547 02/24/14 0230  NA 142 144 144 142 138  K 4.1 3.7 4.1 3.9 3.8  CL 103 106 105 101 96  CO2 21 23 21 25 23   GLUCOSE 124* 87 115* 136* 52*  BUN 86* 84* 80* 89* 92*  CREATININE 3.98* 3.94* 3.96* 4.73* 4.75*  CALCIUM 9.0 8.6 9.0 8.9 8.8  MG  --   --   --  2.2  --   PHOS  --   --  4.8*  --  5.1*   Liver Function Tests:  Recent Labs Lab 02/22/14 0244 02/24/14 0230  AST 25  --   ALT 25  --   ALKPHOS 77  --   BILITOT 1.0  --   PROT 6.9  --   ALBUMIN 2.8*  2.8* 2.6*   CBC:  Recent Labs Lab 02/20/14 1515 02/21/14 0344 02/22/14 0244 02/24/14 0230  WBC 9.3  8.7 9.0 8.3  NEUTROABS 7.2  --   --   --   HGB 9.2* 8.4* 8.8* 8.3*  HCT 29.4* 27.0* 29.0* 27.1*  MCV 87.5 87.4 89.8 86.9  PLT 296 281 283 253    Cardiac Enzymes: No results for input(s): CKTOTAL, CKMB, CKMBINDEX, TROPONINI in the last 168 hours.  BNP: BNP (last 3 results)  Recent Labs  01/15/14 1221 02/20/14 1515  PROBNP 9297.0* 15468.0*     Other results:  EKG:   Imaging: No results found.   Medications:     Scheduled Medications: . antiseptic oral rinse  7 mL Mouth Rinse q12n4p  . apixaban  5 mg Oral BID  . atorvastatin  40 mg Oral Daily  . calcitRIOL  0.25 mcg Oral Q M,W,F  . chlorhexidine  15 mL Mouth Rinse BID  . colchicine  0.3 mg Oral Daily  . darbepoetin (ARANESP) injection - NON-DIALYSIS  100 mcg Subcutaneous Q  Wed-1800  . diltiazem  120 mg Oral Daily  . docusate sodium  100 mg Oral BID  . ferumoxytol  510 mg Intravenous Q3 days  . furosemide  160 mg Intravenous Q6H  . hydrALAZINE  50 mg Oral TID  . insulin aspart  0-15 Units Subcutaneous TID WC  . insulin glargine  35 Units Subcutaneous QHS  . ipratropium  0.5 mg Nebulization TID  . levalbuterol  0.63 mg Nebulization TID  . metolazone  5 mg Oral BID  . metoprolol  100 mg Oral BID  . multivitamin with minerals   Oral Daily  . senna  1 tablet Oral BID  . sodium chloride  3 mL Intravenous Q12H   Infusions:    PRN Medications: acetaminophen **OR** acetaminophen, fentaNYL, HYDROcodone-acetaminophen, levalbuterol, meperidine (DEMEROL) injection, ondansetron **OR** ondansetron (ZOFRAN) IV, promethazine    Assessment:  1. Acute decompensated diastolic congestive heart failure, NYHA Class IV symptoms 2. CKD Stage IV 3. Permanent atrial fibrillation/AFL 4. DM2 5. HTN 6. HPL 7. Acute respiratory failure - now on BIPAP  Plan/Discussion:    Diuresing but still volume overloaded with high O2 requirement. Renal managing diuretics (thank you).   AF rate controlled with cardizem CD 120 mg PO daily.  Continue Eliquis -> may need to switch to heparin soon if creatinine getting worse.   Increase hydralazine to 50 mg three times days. BP elevated at times, if high by tomorrow, increase hydralazine to 75 mg po TID.  Creatinine stable since yesterday but overall worsened.  Significant diuresis in  The last 48 hours -8 lbs and negative 4 liters. Only minimally fluid overloaded on physical exam.  Continue diuretics per renal.  Hoping to avoid HD until fistula matures.    Dorothy Spark 02/24/2014 8:18 AM

## 2014-02-24 NOTE — Progress Notes (Signed)
[facility] Rounding Note Subjective:   Says his breathing is better  Still on ventimask Appetite OK 3.3 liter uop yesterday  Objective Vital signs in last 24 hours: Filed Vitals:   02/24/14 0343 02/24/14 0751 02/24/14 0800 02/24/14 0805  BP: 134/64 133/72 136/67   Pulse: 74 74 68   Temp: 98.5 F (36.9 C) 98.1 F (36.7 C)    TempSrc: Oral Other (Comment)    Resp: 20 20 19 20   Height:      Weight: 90.1 kg (198 lb 10.2 oz)     SpO2: 90% 94% 95% 94%   Weight change: -0.3 kg (-10.6 oz)  Intake/Output Summary (Last 24 hours) at 02/24/14 1131 Last data filed at 02/24/14 0952  Gross per 24 hour  Intake    200 ml  Output   3550 ml  Net  -3350 ml   Physical Exam:  BP 136/67 mmHg  Pulse 68  Temp(Src) 98.1 F (36.7 C) (Other (Comment))  Resp 20  Ht 6' (1.829 m)  Wt 90.1 kg (198 lb 10.2 oz)  BMI 26.93 kg/m2  SpO2 94% Weight Trending 11/4 94.1 kg 11/5 93.7 11/6 90.4 11/7 90.1  General appearance: alert and cooperative Still appears dyspneic at rest Head: Normocephalic, without obvious abnormality, atraumatic, wearing ventimask + JVD 4-5 cm Resp: diminished breath sounds post. Ant clear Cardio:Irregularly irregular rate 100's, S1, S2 normal, no murmur, click, rub or gallop GI: Mild distension, protuberant. Soft. No focal tenderness. Liver edge down Small umbilical hernia Extremities: edema 1+ pretibial Left FA AVG + bruit with  less arm edema. Incision remains clean and dry Neurologic: Grossly normal  Labs:  Recent Labs Lab 02/20/14 1515 02/21/14 0344 02/22/14 0244 02/23/14 0547 02/24/14 0230  NA 142 144 144 142 138  K 4.1 3.7 4.1 3.9 3.8  CL 103 106 105 101 96  CO2 21 23 21 25 23   GLUCOSE 124* 87 115* 136* 52*  BUN 86* 84* 80* 89* 92*  CREATININE 3.98* 3.94* 3.96* 4.73* 4.75*  CALCIUM 9.0 8.6 9.0 8.9 8.8  PHOS  --   --  4.8*  --  5.1*    Recent Labs Lab 02/20/14 1515 02/21/14 0344 02/22/14 0244 02/24/14 0230  WBC 9.3 8.7 9.0 8.3   NEUTROABS 7.2  --   --   --   HGB 9.2* 8.4* 8.8* 8.3*  HCT 29.4* 27.0* 29.0* 27.1*  MCV 87.5 87.4 89.8 86.9  PLT 296 281 283 253    Recent Labs Lab 02/23/14 1637 02/23/14 2212 02/24/14 0559 02/24/14 0631 02/24/14 0753  GLUCAP 176* 130* 50* 92 81   Results for ELDRICK, PENICK (MRN [id number]) as of 02/23/2014 08:51  Ref. Range 02/22/2014 02:44  Iron Latest Range: 42-135 ug/dL 22 (L)  UIBC Latest Range: 125-400 ug/dL 247  TIBC Latest Range: 215-435 ug/dL 269  Saturation Ratios Latest Range: 20-55 % 8 (L)   Results for JERMARIO, KALMAR (MRN [id number]) as of 02/23/2014 08:51  Ref. Range 02/21/2014 13:32  PTH Latest Range: 14-64 pg/mL 248 (H)    Studies/Results: Dg Chest 2 View  02/24/2014   CLINICAL DATA:  Followup bone right upper lobe to make sure No pneumonia. Dyspnea.  EXAM: CHEST  2 VIEW  COMPARISON:  02/20/2014  FINDINGS: Increasing diffuse interstitial opacity with small bilateral pleural effusion. Mild cardiomegaly. Stable aortic contours. The previously noted focal right upper lobe opacity is now not visible. No pneumothorax.  IMPRESSION: 1. Pulmonary edema and small pleural effusions. 2. Previously noted focal  opacity in the right upper lobe is no longer visible.   Electronically Signed   By: Jorje Guild M.D.   On: 02/24/2014 09:10   Medications:   . antiseptic oral rinse  7 mL Mouth Rinse q12n4p  . apixaban  5 mg Oral BID  . atorvastatin  40 mg Oral Daily  . calcitRIOL  0.25 mcg Oral Q M,W,F  . chlorhexidine  15 mL Mouth Rinse BID  . colchicine  0.3 mg Oral Daily  . darbepoetin (ARANESP) injection - NON-DIALYSIS  100 mcg Subcutaneous Q Wed-1800  . diltiazem  120 mg Oral Daily  . docusate sodium  100 mg Oral BID  . ferumoxytol  510 mg Intravenous Q3 days  . furosemide  160 mg Intravenous Q6H  . hydrALAZINE  50 mg Oral TID  . insulin aspart  0-15 Units Subcutaneous TID WC  . insulin glargine  35 Units Subcutaneous QHS  . ipratropium  0.5 mg Nebulization TID  .  levalbuterol  0.63 mg Nebulization TID  . metolazone  5 mg Oral BID  . metoprolol  100 mg Oral BID  . multivitamin with minerals   Oral Daily  . senna  1 tablet Oral BID  . sodium chloride  3 mL Intravenous Q12H    BACKGROUND 58 yo diabetic male with a history of stage 4 CKD prob due to diabetes mellitus, (followed by Dr. Florene Glen) s/p L FA AVG 10/27, chronic atrial fibrillation Rx apixiban, and chronic diastolic CHF.He is admitted on this occasion with decompenstaed CHF. He reports medication compliance but it was Homecoming Weekend" so perhaps some dietary indiscretion. Has had some challenges recently with fluid overload. Echo in April showed a 55-60% EF/ pulm HTN with elevated PA pressures 80's. Baseline renal function - creatinines most recently in the 4-5.5 range (4.25 10/2013, 5.57 01/15/14, 3.98 on admission 11/3).  Nephrology asked to see for diuretic management.  Assessment/Recommdations   Stage 4/5 CKD d/t DM -  S/P left FA AVG 10/27. Creatinine 3.98->4.7 Stable last couple of days  Acute decompensated diastolic CHF/Vol overload - on IV lasix 160 QID (increased 11/6 from 120 TID) plus metolazone 5 mg BID. Fair diuretic response. Weight ? Down about 4 kg so far.Marland KitchenMarland KitchenStill overloaded.  I am hoping we can get him out of trouble with the diuretics and not have to initiate dialysis until his graft is mature (just placed 10/27) but if he cannot maintain good diuretic response on escalated doses of lasix and metolazone may need a tunnelled catheter and initiation of HD - the next 2-3 days will tell the story                   R/o PNA - will need repeat CXR after diuresis to reevaluate RUL abnl  Pulmonary hypertension  Permanent atrial fibrillation - on Eliquis. Cardizem switched short -- >long acting by cards yesterday  Anemia - Started Aranesp 100/week Tawny Asal). 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 Started low dose calcitriol;  phosphorus OK  without binders so far   Jamal Maes, MD [facility] [phone] pager 02/24/2014, 11:31 AM

## 2014-02-25 DIAGNOSIS — I509 Heart failure, unspecified: Secondary | ICD-10-CM

## 2014-02-25 DIAGNOSIS — N189 Chronic kidney disease, unspecified: Secondary | ICD-10-CM

## 2014-02-25 LAB — CBC
HCT: 26.9 % — ABNORMAL LOW (ref 39.0–52.0)
HEMOGLOBIN: 8.3 g/dL — AB (ref 13.0–17.0)
MCH: 26.6 pg (ref 26.0–34.0)
MCHC: 30.9 g/dL (ref 30.0–36.0)
MCV: 86.2 fL (ref 78.0–100.0)
Platelets: 301 10*3/uL (ref 150–400)
RBC: 3.12 MIL/uL — ABNORMAL LOW (ref 4.22–5.81)
RDW: 17.8 % — AB (ref 11.5–15.5)
WBC: 9.6 10*3/uL (ref 4.0–10.5)

## 2014-02-25 LAB — GLUCOSE, CAPILLARY
GLUCOSE-CAPILLARY: 164 mg/dL — AB (ref 70–99)
Glucose-Capillary: 83 mg/dL (ref 70–99)
Glucose-Capillary: 147 mg/dL — ABNORMAL HIGH (ref 70–99)
Glucose-Capillary: 247 mg/dL — ABNORMAL HIGH (ref 70–99)

## 2014-02-25 LAB — RENAL FUNCTION PANEL
ALBUMIN: 2.6 g/dL — AB (ref 3.5–5.2)
ANION GAP: 17 — AB (ref 5–15)
BUN: 96 mg/dL — AB (ref 6–23)
CO2: 28 meq/L (ref 19–32)
Calcium: 8.7 mg/dL (ref 8.4–10.5)
Chloride: 90 mEq/L — ABNORMAL LOW (ref 96–112)
Creatinine, Ser: 4.8 mg/dL — ABNORMAL HIGH (ref 0.50–1.35)
GFR calc Af Amer: 14 mL/min — ABNORMAL LOW (ref >90)
GFR calc non Af Amer: 12 mL/min — ABNORMAL LOW (ref >90)
GLUCOSE: 161 mg/dL — AB (ref 70–99)
POTASSIUM: 3.3 meq/L — AB (ref 3.7–5.3)
Phosphorus: 5.1 mg/dL — ABNORMAL HIGH (ref 2.3–4.6)
Sodium: 135 mEq/L — ABNORMAL LOW (ref 137–147)

## 2014-02-25 MED ORDER — PREDNISONE 20 MG PO TABS
20.0000 mg | ORAL_TABLET | Freq: Every day | ORAL | Status: AC
  Administered 2014-02-25: 20 mg via ORAL
  Filled 2014-02-25: qty 1

## 2014-02-25 MED ORDER — PREDNISONE 10 MG PO TABS
10.0000 mg | ORAL_TABLET | Freq: Every day | ORAL | Status: AC
  Administered 2014-02-28: 10 mg via ORAL
  Filled 2014-02-25 (×2): qty 1

## 2014-02-25 MED ORDER — PREDNISONE 10 MG PO TABS
10.0000 mg | ORAL_TABLET | Freq: Every day | ORAL | Status: AC
  Administered 2014-02-27: 10 mg via ORAL
  Filled 2014-02-25: qty 1

## 2014-02-25 MED ORDER — HYDROMORPHONE HCL 1 MG/ML IJ SOLN
1.0000 mg | INTRAMUSCULAR | Status: DC | PRN
  Administered 2014-02-25 (×2): 1 mg via INTRAVENOUS
  Filled 2014-02-25 (×2): qty 1

## 2014-02-25 MED ORDER — PREDNISONE 5 MG PO TABS
5.0000 mg | ORAL_TABLET | Freq: Every day | ORAL | Status: AC
  Administered 2014-03-02: 5 mg via ORAL
  Filled 2014-02-25 (×2): qty 1

## 2014-02-25 MED ORDER — POTASSIUM CHLORIDE CRYS ER 20 MEQ PO TBCR
40.0000 meq | EXTENDED_RELEASE_TABLET | Freq: Once | ORAL | Status: AC
  Administered 2014-02-25: 40 meq via ORAL
  Filled 2014-02-25: qty 2

## 2014-02-25 MED ORDER — HYDROMORPHONE HCL 1 MG/ML IJ SOLN
0.5000 mg | INTRAMUSCULAR | Status: DC | PRN
  Administered 2014-03-02: 0.5 mg via INTRAVENOUS
  Filled 2014-02-25: qty 1

## 2014-02-25 MED ORDER — PREDNISONE 5 MG PO TABS
5.0000 mg | ORAL_TABLET | Freq: Every day | ORAL | Status: AC
  Administered 2014-03-01: 5 mg via ORAL
  Filled 2014-02-25: qty 1

## 2014-02-25 MED ORDER — PREDNISONE 20 MG PO TABS
20.0000 mg | ORAL_TABLET | Freq: Every day | ORAL | Status: AC
  Administered 2014-02-26: 20 mg via ORAL
  Filled 2014-02-25: qty 1

## 2014-02-25 NOTE — Progress Notes (Signed)
Report called to [place] nurse on unit 3E

## 2014-02-25 NOTE — Progress Notes (Signed)
Patient ID: WYLEY HACK, male   DOB: 16-Jun-1955, 58 y.o.   MRN: [id number] [facility] PROGRESS NOTE  LADERIUS VALBUENA EHU:[id number] DOB: 1955-05-20 DOA: 02/20/2014 PCP: Patricia Nettle, MD  Brief narrative: 58 year old male with PMHx CKD stage III with baseline Cr ~ 4 (follows with Dr. Florene Glen), chronic diastolic CHF, chronic atrial fibrillation since 6/13, failed cardioversion (on Eliquis), most recent 2 D ECHO with EF 55 - 60 %. Pt presented to [facility] with dyspnea and noted to be volume overloaded with increased oxygen demand, CXR consistent with pulmonary edema.   Assessment/Plan:    Acute decompensation of chronic diastolic CHF / Hypokalemia - continue Lasix 160 mg IV Q 6 hours + Metolazone 5 mg PO BID - continue to replete potassium  - overall weight down with recent trend upward in past 24 hours: 207 lbs -- > 206 lbs --> 199 lbs --> 198 --> 201 - appreciate nephrology and cardiology following  - pt transitioned off Ventimask  CKD, stage IV - V - S/P left FA AVG 10/27 - Creatinine trending up secondary to diuresis: 3.94 --> 3.96 --> 4.73 --> 4.80 Acute on chronic hypoxic respiratory failure - multifactorial and secondary to volume overload due to diastolic CHF and progression of his renal disease, atrial fibrillation  - stable respiratory status remains stable - Continue nebulizer treatment with Albuterol and Atrovent BID scheduled  - repeat CXR 11/7, no RUL opacity noted as previously reported on initial CXR  Chronic atrial fibrillation - rate controlled on Cardizem 120 mg PO QD - continue anticoagulation with Eliquis - continue telemetry monitoring; still has slight hypokalemia and because pt is on multiple BP lowering agents and needs closer monitoring of hemodynamic status - cardiology following  Anemia of chronic kidney disease - hemoglobin is 8.3 - continue aranesp per renal recommendations  Severe pulmonary HTN - noted on recent 2 D ECHO  Essential hypertension  -  currently on Lasix 160 mg IV Q6, Cardizem 120 PO QD, Hydralazine 50 mg PO TID, Metoprolol 100 mg PO BID Anemia of chronic kidney disease - started aransep per nephrology team - Hg stable ~8 ? RUL infiltrate - repeat CXR with no focal opacity in the RUL noted  Diabetes mellitus - CBG in past 24 hours: 83, 164, 147 - continue current insulin regimen with Lantus 35 units at bedtime and SSI Hyperlipidemia - Continue with statin Right elbow gout pain - added dilaudid 0.5 mg IV every 4 hours for better pain control since norco did not provide significant pain relief   DVT prophylaxis  Eliquis  Code Status: Full Family Communication: Pt at bedside Disposition Plan: transfer to telemetry   IV Access:    Peripheral IV Procedures and diagnostic studies:    4/30 echocardiogram;Left ventricle: moderate concentric hypertrophy.LVEF= 55% to 60%. Pulmonary arteries: peak pressure: 3mm Hg (S).  Dg Chest 2 View 02/24/2014 Pulmonary edema and small pleural effusions. Previously noted focal opacity in the right upper lobe is no longer visible.  Medical Consultants:    Nephrology  Cardiology  Other Consultants:    None  Anti-Infectives:    None   Leisa Lenz, MD  [facility] Pager [phone]  If 7PM-7AM, please contact night-coverage [email or web address] Password [email or web address] 02/25/2014, 6:02 PM   LOS: 5 days    HPI/Subjective: No acute overnight events.  Objective: Filed Vitals:   02/25/14 0748 02/25/14 0800 02/25/14 1214 02/25/14 1429  BP:  122/83 140/90 105/64  Pulse:  91 85 82  Temp:  98.7  F (37.1 C) 97.4 F (36.3 C) 97.8 F (36.6 C)  TempSrc:  Oral Oral Oral  Resp:  21 10 16   Height:    6' (1.829 m)  Weight:    91.3 kg (201 lb 4.5 oz)  SpO2: 97% 98% 95% 99%    Intake/Output Summary (Last 24 hours) at 02/25/14 1802 Last data filed at 02/25/14 1300  Gross per 24 hour  Intake      0 ml  Output   1725 ml  Net  -1725 ml    Exam:   General:   Pt is alert, follows commands appropriately, not in acute distress  Cardiovascular: irregular rhythm, S1/S2, no murmurs  Respiratory: diminished breath sounds, no wheezing   Abdomen: Softly distended, bowel sounds present  Extremities: +1 pretibial edema, pulses DP and PT palpable bilaterally; left FA AVG with bruit  Neuro: Grossly nonfocal  General appearance: alert and cooperative Looks comfortable on nasal cannula + JVD 4-5 cm Resp: diminished breath sounds post. Ant clear Cardio:Irregularly irregular rate 86 S1, S2 normal, no murmur, click, rub or gallop GI: Mild distension, protuberant. Soft. No focal tenderness. Liver edge down Small umbilical hernia Extremities: edema 1+ pretibial Left FA AVG + bruit with less arm edema.  Incision remains clean and dry Neurologic: Grossly normal  Data Reviewed: Basic Metabolic Panel:  Recent Labs Lab 02/21/14 0344 02/22/14 0244 02/23/14 0547 02/24/14 0230 02/25/14 0307  NA 144 144 142 138 135*  K 3.7 4.1 3.9 3.8 3.3*  CL 106 105 101 96 90*  CO2 23 21 25 23 28   GLUCOSE 87 115* 136* 52* 161*  BUN 84* 80* 89* 92* 96*  CREATININE 3.94* 3.96* 4.73* 4.75* 4.80*  CALCIUM 8.6 9.0 8.9 8.8 8.7  MG  --   --  2.2  --   --   PHOS  --  4.8*  --  5.1* 5.1*   Liver Function Tests:  Recent Labs Lab 02/22/14 0244 02/24/14 0230 02/25/14 0307  AST 25  --   --   ALT 25  --   --   ALKPHOS 77  --   --   BILITOT 1.0  --   --   PROT 6.9  --   --   ALBUMIN 2.8*  2.8* 2.6* 2.6*   No results for input(s): LIPASE, AMYLASE in the last 168 hours. No results for input(s): AMMONIA in the last 168 hours. CBC:  Recent Labs Lab 02/20/14 1515 02/21/14 0344 02/22/14 0244 02/24/14 0230 02/25/14 0307  WBC 9.3 8.7 9.0 8.3 9.6  NEUTROABS 7.2  --   --   --   --   HGB 9.2* 8.4* 8.8* 8.3* 8.3*  HCT 29.4* 27.0* 29.0* 27.1* 26.9*  MCV 87.5 87.4 89.8 86.9 86.2  PLT 296 281 283 253 301   Cardiac Enzymes: No results for input(s): CKTOTAL, CKMB,  CKMBINDEX, TROPONINI in the last 168 hours. BNP: Invalid input(s): POCBNP CBG:  Recent Labs Lab 02/24/14 1729 02/24/14 2230 02/25/14 0844 02/25/14 1217 02/25/14 1616  GLUCAP 202* 133* 83 164* 147*    Recent Results (from the past 240 hour(s))  MRSA PCR Screening     Status: None   Collection Time: 02/21/14 12:46 AM  Result Value Ref Range Status   MRSA by PCR NEGATIVE NEGATIVE Final    Comment:           Scheduled Meds: . apixaban  5 mg Oral BID  . atorvastatin  40 mg Oral Daily  . calcitRIOL  0.25  mcg Oral Q M,W,F  . chlorhexidine  15 mL Mouth Rinse BID  . colchicine  0.3 mg Oral Daily  . darbepoetin   100 mcg Subcutaneous Q Wed-1800  . diltiazem  120 mg Oral Daily  . docusate sodium  100 mg Oral BID  . ferumoxytol  510 mg Intravenous Q3 days  . furosemide  160 mg Intravenous Q6H  . hydrALAZINE  50 mg Oral TID  . insulin aspart  0-15 Units Subcutaneous TID WC  . insulin glargine  35 Units Subcutaneous QHS  . ipratropium  0.5 mg Nebulization BID  . levalbuterol  0.63 mg Nebulization BID  . metolazone  5 mg Oral BID  . metoprolol  100 mg Oral BID  . multivitamin w   Oral Daily  .  predniSONE regimen   20 mg Oral Q breakfast  . senna  1 tablet Oral BID

## 2014-02-25 NOTE — Progress Notes (Signed)
Progress Note   Subjective:    58 yo with history of CKD, chronic atrial fibrillation, and chronic diastolic CHF was referred to CHF clinic for evaluation of CHF and elevated PA pressure on echo. Patient's creatinine has been as high as 4.9, probably due to HTN and diabetes. He follows with Dr. Florene Glen. He has been in atrial fibrillation since 6/13 when he failed a cardioversion and it was decided to leave him in atrial fibrillation. He has been on Eliquis. Most recent echo in 4/15 showed EF 50-35% with PA systolic pressure 85 mmHg. He has had difficulty recently with volume overload.   RHC in 6/15 showing elevated right and left heart filling pressures with moderate pulmonary hypertension, primarily pulmonary venous hypertension.   Admitted with decompensated HF and progressive renal failure. Placed on lasix 120 mg IV three times a day. Metolazone added. Weight down 8 pounds. Still requiring high flow O2  Creatinine trending up table at 3.9>4.73--> 4.75>4.8  The patient states that he feels almost back to baseline.   Objective:    Vital Signs:   Temp:  [97.5 F (36.4 C)-98.1 F (36.7 C)] 98 F (36.7 C) (11/07 1600) Pulse Rate:  [68-90] 78 (11/07 2221) Resp:  [13-20] 13 (11/07 2000) BP: (119-136)/(59-82) 119/59 mmHg (11/07 2221) SpO2:  [93 %-99 %] 93 % (11/07 2000) FiO2 (%):  [55 %] 55 % (11/07 2000) Last BM Date: 02/23/14  Weight change: Filed Weights   02/22/14 0300 02/23/14 0339 02/24/14 0343  Weight: 206 lb 9.1 oz (93.7 kg) 199 lb 4.7 oz (90.4 kg) 198 lb 10.2 oz (90.1 kg)   Intake/Output:   Intake/Output Summary (Last 24 hours) at 02/25/14 0648 Last data filed at 02/25/14 0027  Gross per 24 hour  Intake    440 ml  Output   2301 ml  Net  -1861 ml     Physical Exam: General:  Lying in bed on FM O2. +dyspneic HEENT: normal Neck: supple. JVP+ 4. Carotids 2+ bilat; no bruits. No lymphadenopathy or thryomegaly appreciated. Cor: PMI nondisplaced. Irregular rate &  rhythm. No rubs, gallops or murmurs. Lungs: clear On BiPaP  Abdomen: soft, nontender, distended. No hepatosplenomegaly. No bruits or masses. Good bowel sounds. Extremities: no cyanosis, clubbing, rash, R and LLE trace edema Neuro: alert & orientedx3, cranial nerves grossly intact. moves all 4 extremities w/o difficulty. Affect pleasant  Telemetry: A flutter 100-115  Labs: Basic Metabolic Panel:  Recent Labs Lab 02/21/14 0344 02/22/14 0244 02/23/14 0547 02/24/14 0230 02/25/14 0307  NA 144 144 142 138 135*  K 3.7 4.1 3.9 3.8 3.3*  CL 106 105 101 96 90*  CO2 23 21 25 23 28   GLUCOSE 87 115* 136* 52* 161*  BUN 84* 80* 89* 92* 96*  CREATININE 3.94* 3.96* 4.73* 4.75* 4.80*  CALCIUM 8.6 9.0 8.9 8.8 8.7  MG  --   --  2.2  --   --   PHOS  --  4.8*  --  5.1* 5.1*   Liver Function Tests:  Recent Labs Lab 02/22/14 0244 02/24/14 0230 02/25/14 0307  AST 25  --   --   ALT 25  --   --   ALKPHOS 77  --   --   BILITOT 1.0  --   --   PROT 6.9  --   --   ALBUMIN 2.8*  2.8* 2.6* 2.6*   CBC:  Recent Labs Lab 02/20/14 1515 02/21/14 0344 02/22/14 0244 02/24/14 0230 02/25/14 0307  WBC 9.3 8.7 9.0  8.3 9.6  NEUTROABS 7.2  --   --   --   --   HGB 9.2* 8.4* 8.8* 8.3* 8.3*  HCT 29.4* 27.0* 29.0* 27.1* 26.9*  MCV 87.5 87.4 89.8 86.9 86.2  PLT 296 281 283 253 301    Cardiac Enzymes: No results for input(s): CKTOTAL, CKMB, CKMBINDEX, TROPONINI in the last 168 hours.  BNP: BNP (last 3 results)  Recent Labs  01/15/14 1221 02/20/14 1515 02/24/14 1145  PROBNP 9297.0* 15468.0* 10410.0*     Other results:  EKG:   Imaging: Dg Chest 2 View  02/24/2014   CLINICAL DATA:  Followup bone right upper lobe to make sure No pneumonia. Dyspnea.  EXAM: CHEST  2 VIEW  COMPARISON:  02/20/2014  FINDINGS: Increasing diffuse interstitial opacity with small bilateral pleural effusion. Mild cardiomegaly. Stable aortic contours. The previously noted focal right upper lobe opacity is now not  visible. No pneumothorax.  IMPRESSION: 1. Pulmonary edema and small pleural effusions. 2. Previously noted focal opacity in the right upper lobe is no longer visible.   Electronically Signed   By: Jorje Guild M.D.   On: 02/24/2014 09:10     Medications:     Scheduled Medications: . antiseptic oral rinse  7 mL Mouth Rinse q12n4p  . apixaban  5 mg Oral BID  . atorvastatin  40 mg Oral Daily  . calcitRIOL  0.25 mcg Oral Q M,W,F  . chlorhexidine  15 mL Mouth Rinse BID  . colchicine  0.3 mg Oral Daily  . darbepoetin (ARANESP) injection - NON-DIALYSIS  100 mcg Subcutaneous Q Wed-1800  . diltiazem  120 mg Oral Daily  . docusate sodium  100 mg Oral BID  . ferumoxytol  510 mg Intravenous Q3 days  . furosemide  160 mg Intravenous Q6H  . hydrALAZINE  50 mg Oral TID  . insulin aspart  0-15 Units Subcutaneous TID WC  . insulin glargine  35 Units Subcutaneous QHS  . ipratropium  0.5 mg Nebulization BID  . levalbuterol  0.63 mg Nebulization BID  . metolazone  5 mg Oral BID  . metoprolol  100 mg Oral BID  . multivitamin with minerals   Oral Daily  . senna  1 tablet Oral BID  . sodium chloride  3 mL Intravenous Q12H   Infusions:    PRN Medications: acetaminophen **OR** acetaminophen, fentaNYL, HYDROcodone-acetaminophen, levalbuterol, meperidine (DEMEROL) injection, ondansetron **OR** ondansetron (ZOFRAN) IV, promethazine    Assessment:  1. Acute decompensated diastolic congestive heart failure, NYHA Class IV symptoms 2. CKD Stage IV 3. Permanent atrial fibrillation/AFL 4. DM2 5. HTN 6. HPL 7. Acute respiratory failure - now on BIPAP  Plan/Discussion:    Diuresing well, only mildly  volume overloaded with decreased O2 requirement. Renal managing diuretics (thank you).  BNP decreased from 15.000>10.000.  AF rate controlled with cardizem CD 120 mg PO daily.  Continue Eliquis -> may need to switch to heparin soon if creatinine getting worse.   BP controlled with increased  hydralazine to 50 mg three times days.   Creatinine slowly increasing.  Significant diuresis in  the last 72 hours negative 6 liters, no weights today yet. Only minimally fluid overloaded on physical exam.  Continue diuretics per renal.  Hoping to avoid HD until fistula matures.    He can be transferred to the floor.  Dorothy Spark 02/25/2014 6:48 AM

## 2014-02-25 NOTE — Progress Notes (Signed)
[facility] Rounding Note Subjective:  Has finally transitioned from ventimask to nasal cannula Breathing continues to improve with diuresis Still on IV lasix 160 Q6H and metolazone 5 mg BID 2.3 liters UOP/24 hours CXR still shows interstitial edema  Biggest c/o today is acute gout in his right elbow and wrist Received IV dilaudid I see on med list he is on  Colchicine but not on allopurinol or Uloric  Weight Trending 11/4 94.1 kg 11/5 93.7 11/6 90.4 11/7 90.1 11/8 no weight done  Physical examination BP 119/59 mmHg  Pulse 78  Temp(Src) 98 F (36.7 C) (Oral)  Resp 13  Ht 6' (1.829 m)  Wt 90.1 kg (198 lb 10.2 oz)  BMI 26.93 kg/m2  SpO2 97%  General appearance: alert and cooperative Looks comfortable on nasal cannula + JVD 4-5 cm Resp: diminished breath sounds post. Ant clear Cardio:Irregularly irregular rate 86  S1, S2 normal, no murmur, click, rub or gallop GI: Mild distension, protuberant. Soft. No focal tenderness. Liver edge down Small umbilical hernia Extremities: edema 1+ pretibial Left FA AVG + bruit with  less arm edema.  Incision remains clean and dry Neurologic: Grossly normal  Labs:  Recent Labs Lab 02/20/14 1515 02/21/14 0344 02/22/14 0244 02/23/14 0547 02/24/14 0230 02/25/14 0307  NA 142 144 144 142 138 135*  K 4.1 3.7 4.1 3.9 3.8 3.3*  CL 103 106 105 101 96 90*  CO2 21 23 21 25 23 28   GLUCOSE 124* 87 115* 136* 52* 161*  BUN 86* 84* 80* 89* 92* 96*  CREATININE 3.98* 3.94* 3.96* 4.73* 4.75* 4.80*  CALCIUM 9.0 8.6 9.0 8.9 8.8 8.7  PHOS  --   --  4.8*  --  5.1* 5.1*    Recent Labs Lab 02/20/14 1515 02/21/14 0344 02/22/14 0244 02/24/14 0230 02/25/14 0307  WBC 9.3 8.7 9.0 8.3 9.6  NEUTROABS 7.2  --   --   --   --   HGB 9.2* 8.4* 8.8* 8.3* 8.3*  HCT 29.4* 27.0* 29.0* 27.1* 26.9*  MCV 87.5 87.4 89.8 86.9 86.2  PLT 296 281 283 253 301    Recent Labs Lab 02/24/14 0631 02/24/14 0753 02/24/14 1226 02/24/14 1729  02/24/14 2230  GLUCAP 92 81 239* 202* 133*   Results for KIMSEY, DEMAREE (MRN [id number]) as of 02/23/2014 08:51  Ref. Range 02/22/2014 02:44  Iron Latest Range: 42-135 ug/dL 22 (L)  UIBC Latest Range: 125-400 ug/dL 247  TIBC Latest Range: 215-435 ug/dL 269  Saturation Ratios Latest Range: 20-55 % 8 (L)   Results for EDIE, DARLEY (MRN [id number]) as of 02/23/2014 08:51  Ref. Range 02/21/2014 13:32  PTH Latest Range: 14-64 pg/mL 248 (H)    Studies/Results: Dg Chest 2 View  02/24/2014   CLINICAL DATA:  Followup bone right upper lobe to make sure No pneumonia. Dyspnea.  EXAM: CHEST  2 VIEW  COMPARISON:  02/20/2014  FINDINGS: Increasing diffuse interstitial opacity with small bilateral pleural effusion. Mild cardiomegaly. Stable aortic contours. The previously noted focal right upper lobe opacity is now not visible. No pneumothorax.  IMPRESSION: 1. Pulmonary edema and small pleural effusions. 2. Previously noted focal opacity in the right upper lobe is no longer visible.   Electronically Signed   By: Jorje Guild M.D.   On: 02/24/2014 09:10   Medications:   . antiseptic oral rinse  7 mL Mouth Rinse q12n4p  . apixaban  5 mg Oral BID  . atorvastatin  40 mg Oral Daily  .  calcitRIOL  0.25 mcg Oral Q M,W,F  . chlorhexidine  15 mL Mouth Rinse BID  . colchicine  0.3 mg Oral Daily  . darbepoetin (ARANESP) injection - NON-DIALYSIS  100 mcg Subcutaneous Q Wed-1800  . diltiazem  120 mg Oral Daily  . docusate sodium  100 mg Oral BID  . ferumoxytol  510 mg Intravenous Q3 days  . furosemide  160 mg Intravenous Q6H  . hydrALAZINE  50 mg Oral TID  . insulin aspart  0-15 Units Subcutaneous TID WC  . insulin glargine  35 Units Subcutaneous QHS  . ipratropium  0.5 mg Nebulization BID  . levalbuterol  0.63 mg Nebulization BID  . metolazone  5 mg Oral BID  . metoprolol  100 mg Oral BID  . multivitamin with minerals   Oral Daily  . senna  1 tablet Oral BID  . sodium chloride  3 mL Intravenous Q12H   acetaminophen **OR** acetaminophen, HYDROcodone-acetaminophen, HYDROmorphone (DILAUDID) injection, levalbuterol, meperidine (DEMEROL) injection, ondansetron **OR** ondansetron (ZOFRAN) IV, promethazine  BACKGROUND 58 yo diabetic male with a history of stage 4 CKD prob due to diabetes mellitus, (followed by Dr. Florene Glen) s/p L FA AVG 10/27, chronic atrial fibrillation Rx apixiban, and chronic diastolic CHF.He is admitted on this occasion with decompensated diastolic CHF. He reports medication compliance perhaps some dietary indiscretion. Has had challenges recently with fluid overload. Echo 07/2013  55-60% EF/ pulm HTN/ elevated PA pressures 80's. Baseline renal function -> creatinines most recently in the 4-5.5 range (4.25 10/2013, 5.57 01/15/14, 3.98 on admission 11/3, around 4.7-4.7 now).  Nephrology asked to see for diuretic management.  Assessment/Recommdations   Stage 5 CKD d/t DM -  S/P left FA AVG 10/27. Creatinine Stable last couple of days 4.7-4.8. Responding to diuretics albeit pretty max doses. AVG will not be ready for use until late Nov. I am hoping we can get/keep him out of trouble with the diuretics and not have to initiate dialysis until his graft is mature but if he cannot maintain adequate diuretic response on escalated doses of lasix and metolazone may need a tunnelled catheter and initiation of HD this admission. So far we are "maintaining" with imp in SOB  but have not cleared CXR.  Not ready for change to po.  Acute decompensated diastolic CHF/Vol overload - on IV lasix 160 QID (increased 11/6 from 120 TID) plus metolazone 5 mg BID. Fair diuretic response. Weight ? Down about 4 kg so far.Marland KitchenMarland KitchenStill overloaded by exam and x-ray   Hypokalemia - d/t diuretics. KDur 40 po today X1                  R/o PNA - RUL abnl no longer present on CXR 11/7 (just edema)  Pulmonary hypertension  Permanent atrial fibrillation - on Eliquis. Cardizem switched short -- >long acting by  cards  Anemia - Started Aranesp 100/week Tawny Asal). 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 Started low dose calcitriol;  phosphorus OK without binders so far  Acute gout - on colchicine (dose adjusted). He isn't sure if ever on allopurinol or Uloric. Required IV dilauded for wrist and elbow pain. Can't use NSAIDS. Check uric acid. Quick pred taper (20-20-10-10-5-5-0). Continue colchicine.   Jamal Maes, MD [facility] [phone] pager 02/25/2014, 8:05 AM

## 2014-02-26 LAB — PROTIME-INR
INR: 1.66 — ABNORMAL HIGH (ref 0.00–1.49)
PROTHROMBIN TIME: 19.8 s — AB (ref 11.6–15.2)

## 2014-02-26 LAB — GLUCOSE, CAPILLARY
Glucose-Capillary: 173 mg/dL — ABNORMAL HIGH (ref 70–99)
Glucose-Capillary: 169 mg/dL — ABNORMAL HIGH (ref 70–99)
Glucose-Capillary: 214 mg/dL — ABNORMAL HIGH (ref 70–99)
Glucose-Capillary: 374 mg/dL — ABNORMAL HIGH (ref 70–99)

## 2014-02-26 LAB — CBC
HEMATOCRIT: 24.6 % — AB (ref 39.0–52.0)
HEMOGLOBIN: 7.7 g/dL — AB (ref 13.0–17.0)
MCH: 27.3 pg (ref 26.0–34.0)
MCHC: 31.3 g/dL (ref 30.0–36.0)
MCV: 87.2 fL (ref 78.0–100.0)
Platelets: 278 10*3/uL (ref 150–400)
RBC: 2.82 MIL/uL — ABNORMAL LOW (ref 4.22–5.81)
RDW: 18 % — ABNORMAL HIGH (ref 11.5–15.5)
WBC: 12.3 10*3/uL — ABNORMAL HIGH (ref 4.0–10.5)

## 2014-02-26 LAB — BASIC METABOLIC PANEL
Anion gap: 18 — ABNORMAL HIGH (ref 5–15)
BUN: 104 mg/dL — AB (ref 6–23)
CHLORIDE: 88 meq/L — AB (ref 96–112)
CO2: 28 mEq/L (ref 19–32)
Calcium: 8.8 mg/dL (ref 8.4–10.5)
Creatinine, Ser: 5.29 mg/dL — ABNORMAL HIGH (ref 0.50–1.35)
GFR calc Af Amer: 13 mL/min — ABNORMAL LOW (ref >90)
GFR calc non Af Amer: 11 mL/min — ABNORMAL LOW (ref >90)
GLUCOSE: 240 mg/dL — AB (ref 70–99)
Potassium: 3.6 mEq/L — ABNORMAL LOW (ref 3.7–5.3)
Sodium: 134 mEq/L — ABNORMAL LOW (ref 137–147)

## 2014-02-26 LAB — HEPARIN LEVEL (UNFRACTIONATED): Heparin Unfractionated: 2.01 IU/mL — ABNORMAL HIGH (ref 0.30–0.70)

## 2014-02-26 LAB — URIC ACID: Uric Acid, Serum: 16.6 mg/dL — ABNORMAL HIGH (ref 4.0–7.8)

## 2014-02-26 LAB — APTT: aPTT: 44 seconds — ABNORMAL HIGH (ref 24–37)

## 2014-02-26 MED ORDER — COUMADIN BOOK
Freq: Once | Status: AC
  Administered 2014-02-26: 14:00:00
  Filled 2014-02-26: qty 1

## 2014-02-26 MED ORDER — INSULIN GLARGINE 100 UNIT/ML ~~LOC~~ SOLN
40.0000 [IU] | Freq: Every day | SUBCUTANEOUS | Status: DC
  Administered 2014-02-26: 40 [IU] via SUBCUTANEOUS
  Filled 2014-02-26 (×2): qty 0.4

## 2014-02-26 MED ORDER — HEPARIN (PORCINE) IN NACL 100-0.45 UNIT/ML-% IJ SOLN
1200.0000 [IU]/h | INTRAMUSCULAR | Status: DC
  Administered 2014-02-26: 1200 [IU]/h via INTRAVENOUS
  Filled 2014-02-26: qty 250

## 2014-02-26 MED ORDER — POTASSIUM CHLORIDE CRYS ER 20 MEQ PO TBCR
40.0000 meq | EXTENDED_RELEASE_TABLET | Freq: Once | ORAL | Status: AC
  Administered 2014-02-26: 40 meq via ORAL
  Filled 2014-02-26: qty 2

## 2014-02-26 MED ORDER — WARFARIN SODIUM 10 MG PO TABS
10.0000 mg | ORAL_TABLET | Freq: Once | ORAL | Status: AC
  Administered 2014-02-26: 10 mg via ORAL
  Filled 2014-02-26: qty 1

## 2014-02-26 MED ORDER — WARFARIN - PHARMACIST DOSING INPATIENT
Freq: Every day | Status: DC
  Administered 2014-02-26 – 2014-03-01 (×3)

## 2014-02-26 MED ORDER — WARFARIN VIDEO: Freq: Once | Status: DC

## 2014-02-26 MED ORDER — COLCHICINE 0.6 MG PO TABS
0.6000 mg | ORAL_TABLET | Freq: Two times a day (BID) | ORAL | Status: DC
  Administered 2014-02-26 – 2014-03-03 (×10): 0.6 mg via ORAL
  Filled 2014-02-26 (×11): qty 1

## 2014-02-26 MED ORDER — FUROSEMIDE 80 MG PO TABS
160.0000 mg | ORAL_TABLET | Freq: Three times a day (TID) | ORAL | Status: DC
  Administered 2014-02-26 – 2014-03-03 (×16): 160 mg via ORAL
  Filled 2014-02-26 (×18): qty 2

## 2014-02-26 NOTE — Progress Notes (Signed)
UR completed Rashanna Christiana K. Laytoya Ion, RN, BSN, [place], CCM  02/26/2014 4:19 PM

## 2014-02-26 NOTE — Progress Notes (Signed)
[facility] Rounding Note Subjective:  Remains on nasal cannula Breathing continues to improve with diuresis Still on IV lasix 160 Q6H and metolazone 5 mg BID 2 liters UOP/24 hours CXR still shows interstitial edema  Hand is a little better but still hurts   Weight Trending 11/4 94.1 kg 11/5 93.7 11/6 90.4 11/7 90.1 11/8 no weight done 11/9 86.9  Physical examination BP 129/74 mmHg  Pulse 98  Temp(Src) 98.9 F (37.2 C) (Oral)  Resp 18  Ht 6' (1.829 m)  Wt 86.909 kg (191 lb 9.6 oz)  BMI 25.98 kg/m2  SpO2 96%  General appearance: alert and cooperative Looks comfortable on nasal cannula + JVD 4-5 cm Resp: diminished breath sounds post. Ant clear Cardio:Irregularly irregular rate 86  S1, S2 normal, no murmur, click, rub or gallop GI: Mild distension, protuberant. Soft. No focal tenderness. Liver edge down Small umbilical hernia Extremities: edema 1+ pretibial Left FA AVG + bruit with  less arm edema.  Incision remains clean and dry Neurologic: Grossly normal  Labs:  Recent Labs Lab 02/20/14 1515 02/21/14 0344 02/22/14 0244 02/23/14 0547 02/24/14 0230 02/25/14 0307 02/26/14 0401  NA 142 144 144 142 138 135* 134*  K 4.1 3.7 4.1 3.9 3.8 3.3* 3.6*  CL 103 106 105 101 96 90* 88*  CO2 21 23 21 25 23 28 28   GLUCOSE 124* 87 115* 136* 52* 161* 240*  BUN 86* 84* 80* 89* 92* 96* 104*  CREATININE 3.98* 3.94* 3.96* 4.73* 4.75* 4.80* 5.29*  CALCIUM 9.0 8.6 9.0 8.9 8.8 8.7 8.8  PHOS  --   --  4.8*  --  5.1* 5.1*  --     Recent Labs Lab 02/20/14 1515  02/22/14 0244 02/24/14 0230 02/25/14 0307 02/26/14 0401  WBC 9.3  < > 9.0 8.3 9.6 12.3*  NEUTROABS 7.2  --   --   --   --   --   HGB 9.2*  < > 8.8* 8.3* 8.3* 7.7*  HCT 29.4*  < > 29.0* 27.1* 26.9* 24.6*  MCV 87.5  < > 89.8 86.9 86.2 87.2  PLT 296  < > 283 253 301 278  < > = values in this interval not displayed.  Recent Labs Lab 02/25/14 1217 02/25/14 1616 02/25/14 2146 02/26/14 0610  02/26/14 1050  GLUCAP 164* 147* 247* 173* 169*   Results for ZEBADIAH, WILLERT (MRN [id number]) as of 02/23/2014 08:51  Ref. Range 02/22/2014 02:44  Iron Latest Range: 42-135 ug/dL 22 (L)  UIBC Latest Range: 125-400 ug/dL 247  TIBC Latest Range: 215-435 ug/dL 269  Saturation Ratios Latest Range: 20-55 % 8 (L)   Results for MIRL, HILLERY (MRN [id number]) as of 02/23/2014 08:51  Ref. Range 02/21/2014 13:32  PTH Latest Range: 14-64 pg/mL 248 (H)    Studies/Results: No results found. Medications:   . antiseptic oral rinse  7 mL Mouth Rinse q12n4p  . apixaban  5 mg Oral BID  . atorvastatin  40 mg Oral Daily  . calcitRIOL  0.25 mcg Oral Q M,W,F  . chlorhexidine  15 mL Mouth Rinse BID  . colchicine  0.3 mg Oral Daily  . darbepoetin (ARANESP) injection - NON-DIALYSIS  100 mcg Subcutaneous Q Wed-1800  . diltiazem  120 mg Oral Daily  . docusate sodium  100 mg Oral BID  . ferumoxytol  510 mg Intravenous Q3 days  . furosemide  160 mg Intravenous Q6H  . hydrALAZINE  50 mg Oral TID  . insulin aspart  0-15 Units Subcutaneous TID WC  . insulin glargine  35 Units Subcutaneous QHS  . ipratropium  0.5 mg Nebulization BID  . levalbuterol  0.63 mg Nebulization BID  . metolazone  5 mg Oral BID  . metoprolol  100 mg Oral BID  . multivitamin with minerals   Oral Daily  . [START ON 02/27/2014] predniSONE  10 mg Oral Q breakfast   Followed by  . [START ON 02/28/2014] predniSONE  10 mg Oral Q breakfast   Followed by  . [START ON 03/01/2014] predniSONE  5 mg Oral Q breakfast   Followed by  . [START ON 03/02/2014] predniSONE  5 mg Oral Q breakfast  . senna  1 tablet Oral BID  . sodium chloride  3 mL Intravenous Q12H  acetaminophen **OR** acetaminophen, HYDROcodone-acetaminophen, HYDROmorphone (DILAUDID) injection, levalbuterol, ondansetron **OR** ondansetron (ZOFRAN) IV  BACKGROUND 58 yo diabetic male with a history of stage 4 CKD prob due to diabetes mellitus, (followed by Dr. Florene Glen) s/p L FA AVG  10/27, chronic atrial fibrillation Rx apixiban, and chronic diastolic CHF.He is admitted on this occasion with decompensated diastolic CHF. He reports medication compliance perhaps some dietary indiscretion. Has had challenges recently with fluid overload. Echo 07/2013  55-60% EF/ pulm HTN/ elevated PA pressures 80's. Baseline renal function -> creatinines most recently in the 4-5.5 range (4.25 10/2013, 5.57 01/15/14, 3.98 on admission 11/3, around 4.7-4.7 now).  Nephrology asked to see for diuretic management.  Assessment/Recommdations   Stage 5 CKD d/t DM -  S/P left FA AVG 10/27. Creatinine Stable last couple of days 4.7-4.8. Responding to diuretics albeit pretty max doses. AVG will not be ready for use until late Nov. I am hoping we can get/keep him out of trouble with the diuretics and not have to initiate dialysis until his graft is mature but if he cannot maintain adequate diuretic response on escalated doses of lasix and metolazone may need a tunnelled catheter and initiation of HD this admission. So far we are "maintaining" with imp in SOB  but have not cleared CXR.  I would like to change to PO's today.  We can get him "primed" to start HD the week of Thanksgiving  Acute decompensated diastolic CHF/Vol overload - on IV lasix 160 QID (increased 11/6 from 120 TID) plus metolazone 5 mg BID. Fair diuretic response. Weight definitely down- will change to PO  Hypokalemia - d/t diuretics. S/p repletion- giving 40 daily             R/o PNA - RUL abnl no longer present on CXR 11/7 (just edema)  Pulmonary hypertension  Permanent atrial fibrillation - on Eliquis. Cardizem switched short -- >long acting by cards- going to change to coumadin  Anemia - Started Aranesp 100/week Tawny Asal). 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 Started low dose calcitriol;  phosphorus OK without binders so far  Acute gout - on colchicine (dose adjusted). He isn't sure if ever on  allopurinol or Uloric. Required IV dilauded for wrist and elbow pain. Can't use NSAIDS. Check uric acid. Quick pred taper (20-20-10-10-5-5-0). Continue colchicine, increase dose. Uric acid 16 !!! Will eventually need uloric once flare clears    [facility] [phone] pager 02/26/2014, 11:27 AM

## 2014-02-26 NOTE — Care Management Note (Addendum)
    Page 1 of 2   03/03/2014     12:17:10 PM CARE MANAGEMENT NOTE 03/03/2014  Patient:  Joseph [name], Joseph [name]   Account Number:  [id number]  Date Initiated:  02/21/2014  Documentation initiated by:  [facility]  Subjective/Objective Assessment:   CHF     Action/Plan:   CM to follow for disposition needs   Anticipated DC Date:  02/28/2014   Anticipated DC Plan:  [place]  CM consult      [facility] Choice  NA   Choice offered to / List presented to:  NA   DME arranged  OXYGEN      DME agency  [place] arranged  [place] - 11 Patient Refused      [place]   Status of service:  Completed, signed off Medicare Important Message given?  NO (If response is "NO", the following Medicare IM given date fields will be blank) Date Medicare IM given:   Medicare IM given by:   Date Additional Medicare IM given:   Additional Medicare IM given by:    Discharge Disposition:  HOME/SELF CARE  Per UR Regulation:  Reviewed for med. necessity/level of care/duration of stay  If discussed at [place] of Stay Meetings, dates discussed:    Comments:  11/14 12:05 CM met with pt in room to confirm [facility] services with home health agency.  Pt [place] he does NOT want any home health services and please leave his room. CM called [facility] rep, Joseph [name] to notify.  CM notes home oxygen tank in room of pt.  No other CM needs were communicated.  Mariane Masters, BSN, [place].  Crystal Hutchinson RN, BSN, MSHL, CCM  Nurse - Case Manager,  (Unit [place] [phone]  02/28/2014 Social:  From home with wife.  AVG/Fistula in place; will mature around late Nov. Meds:  Eliquis active prior to this admission Home DME:  Home O2 active HFT: active Dispo Plan:  Home / HHS:  RN  [facility] / mary notified)  CM to notify [facility] of final d/c date for [facility] needs. ([place] case determination per LOS RECS on 02/27/2014 - designation to  [employer])   Crystal Hutchinson RN, BSN, MSHL, CCM  Nurse - Case Manager,  (Unit [phone]  02/26/2014 Social:  From home with wife.  AVG/Fistula in place; will mature around late Nov. HFT: active IV Lasix q 6 hours Dispo Plan:  Home / self care     02/21/2014 1430 UR completed. Chart reviewed. Jonnie Finner RN CCM CCM Case Mgmt

## 2014-02-26 NOTE — Plan of Care (Signed)
Problem: Phase I Progression Outcomes Goal: Voiding-avoid urinary catheter unless indicated Outcome: Completed/Met Date Met:  02/26/14     

## 2014-02-26 NOTE — Plan of Care (Signed)
Problem: Phase I Progression Outcomes Goal: Pain controlled with appropriate interventions Outcome: Completed/Met Date Met:  02/26/14     

## 2014-02-26 NOTE — Consult Note (Signed)
ANTICOAGULATION CONSULT NOTE - Initial Consult  Pharmacy Consult for Heparin and Coumadin Indication: atrial fibrillation  No Known Allergies  Patient Measurements: Height: 6' (182.9 cm) Weight: 191 lb 9.6 oz (86.909 kg) (b scale; rn in room when weighing pt on scale) IBW/kg (Calculated) : 77.6 Heparin Dosing Weight: 86kg  Vital Signs: Temp: 98.9 F (37.2 C) (11/09 0644) Temp Source: Oral (11/09 0644) BP: 129/74 mmHg (11/09 1045) Pulse Rate: 98 (11/09 1045)  Labs:  Recent Labs  02/24/14 0230 02/25/14 0307 02/26/14 0401  HGB 8.3* 8.3* 7.7*  HCT 27.1* 26.9* 24.6*  PLT 253 301 278  CREATININE 4.75* 4.80* 5.29*    Estimated Creatinine Clearance: 16.7 mL/min (by C-G formula based on Cr of 5.29).   Medical History: Past Medical History  Diagnosis Date  . Atrial fibrillation     a. 11/18/2011 s/p DCCV - 150J;  b. Anticoagulation w/ Apixaban;  c. 11/2011 back in afib->asymptomatic.  . H/O alcohol abuse     a. drinks heavily on the weekends.  . Hypertension   . Diabetes mellitus   . Heart murmur     a. 09/2011 Echo: EF 55-60%, Triv AI, Mild MR, mildly dil LA.  . Diabetic peripheral neuropathy   . CKD (chronic kidney disease), stage III   . CHF (congestive heart failure)   . Pneumonia    Assessment: 58yom on apixaban for afib, now with worsening renal function and approaching dialysis. Apixaban will be changed to coumadin with heparin bridge until INR therapeutic. Last apixaban dose this morning at 1049. Anticipate that we may see the effects of apixaban for a longer period of time given renal dysfunction. Will use aPTTs to initially monitor heparin as apixaban falsely elevates heparin levels. Coumadin score = 9.   Goal of Therapy: INR 2-3  APTT 66-102 seconds Heparin level 0.3-0.7 units/ml Monitor platelets by anticoagulation protocol: Yes   Plan:  1) Obtain baseline aPTT, heparin level, and INR 2) At 2300 tonight, start heparin at 1200 units/hr with NO bolus 3)  Check 8 hour aPTT 4) Coumadin 10mg  x 1 5) Daily aPTT, heparin level, INR 6) Coumadin education - book/video   Deboraha Sprang 02/26/2014,1:28 PM

## 2014-02-26 NOTE — Progress Notes (Signed)
Progress Note   Subjective:    58 yo with history of CKD, chronic atrial fibrillation, and chronic diastolic CHF was referred to CHF clinic for evaluation of CHF and elevated PA pressure on echo. Patient's creatinine has been as high as 4.9, probably due to HTN and diabetes. He follows with Dr. Florene Glen. He has been in atrial fibrillation since 6/13 when he failed a cardioversion and it was decided to leave him in atrial fibrillation. He has been on Eliquis. Most recent echo in 4/15 showed EF 06-30% with PA systolic pressure 85 mmHg. He has had difficulty recently with volume overload.   RHC in 6/15 showing elevated right and left heart filling pressures with moderate pulmonary hypertension, primarily pulmonary venous hypertension.   Admitted with decompensated HF and progressive renal failure. Placed on lasix 120 mg IV three times a day. Metolazone added. Weight down 10 pounds. Denies SOB, orthopnea or CP.  Creatinine trending up table at 3.9>4.73--> 4.75>4.8>5.4   Objective:    Vital Signs:   Temp:  [97.4 F (36.3 C)-98.9 F (37.2 C)] 98.9 F (37.2 C) (11/09 0644) Pulse Rate:  [82-103] 84 (11/09 0644) Resp:  [10-18] 18 (11/09 0644) BP: (105-140)/(59-90) 121/66 mmHg (11/09 0644) SpO2:  [95 %-99 %] 99 % (11/09 0644) Weight:  [191 lb 9.6 oz (86.909 kg)-201 lb 4.5 oz (91.3 kg)] 191 lb 9.6 oz (86.909 kg) (11/09 0644) Last BM Date: 02/23/14  Weight change: Filed Weights   02/24/14 0343 02/25/14 1429 02/26/14 0644  Weight: 198 lb 10.2 oz (90.1 kg) 201 lb 4.5 oz (91.3 kg) 191 lb 9.6 oz (86.909 kg)   Intake/Output:   Intake/Output Summary (Last 24 hours) at 02/26/14 0829 Last data filed at 02/26/14 0500  Gross per 24 hour  Intake    120 ml  Output   1625 ml  Net  -1505 ml     Physical Exam: General:  Sitting on side of bed; NAD Neck: supple. JVP 8;. Carotids 2+ bilat; no bruits. No lymphadenopathy or thryomegaly appreciated. Cor: PMI nondisplaced. Irregular rate &  rhythm. No rubs, gallops or murmurs. Lungs: clear, on 3L [place] Abdomen: soft, nontender, distended. No hepatosplenomegaly. No bruits or masses. Good bowel sounds. Extremities: no cyanosis, clubbing, rash, no edema Neuro: alert & orientedx3, cranial nerves grossly intact. moves all 4 extremities w/o difficulty. Affect pleasant  Telemetry: A flutter 80-90s  Labs: Basic Metabolic Panel:  Recent Labs Lab 02/22/14 0244 02/23/14 0547 02/24/14 0230 02/25/14 0307 02/26/14 0401  NA 144 142 138 135* 134*  K 4.1 3.9 3.8 3.3* 3.6*  CL 105 101 96 90* 88*  CO2 21 25 23 28 28   GLUCOSE 115* 136* 52* 161* 240*  BUN 80* 89* 92* 96* 104*  CREATININE 3.96* 4.73* 4.75* 4.80* 5.29*  CALCIUM 9.0 8.9 8.8 8.7 8.8  MG  --  2.2  --   --   --   PHOS 4.8*  --  5.1* 5.1*  --    Liver Function Tests:  Recent Labs Lab 02/22/14 0244 02/24/14 0230 02/25/14 0307  AST 25  --   --   ALT 25  --   --   ALKPHOS 77  --   --   BILITOT 1.0  --   --   PROT 6.9  --   --   ALBUMIN 2.8*  2.8* 2.6* 2.6*   CBC:  Recent Labs Lab 02/20/14 1515 02/21/14 0344 02/22/14 0244 02/24/14 0230 02/25/14 0307 02/26/14 0401  WBC 9.3 8.7 9.0 8.3 9.6 12.3*  NEUTROABS 7.2  --   --   --   --   --   HGB 9.2* 8.4* 8.8* 8.3* 8.3* 7.7*  HCT 29.4* 27.0* 29.0* 27.1* 26.9* 24.6*  MCV 87.5 87.4 89.8 86.9 86.2 87.2  PLT 296 281 283 253 301 278    Cardiac Enzymes: No results for input(s): CKTOTAL, CKMB, CKMBINDEX, TROPONINI in the last 168 hours.  BNP: BNP (last 3 results)  Recent Labs  01/15/14 1221 02/20/14 1515 02/24/14 1145  PROBNP 9297.0* 15468.0* 10410.0*     Other results:  EKG:   Imaging: Dg Chest 2 View  02/24/2014   CLINICAL DATA:  Followup bone right upper lobe to make sure No pneumonia. Dyspnea.  EXAM: CHEST  2 VIEW  COMPARISON:  02/20/2014  FINDINGS: Increasing diffuse interstitial opacity with small bilateral pleural effusion. Mild cardiomegaly. Stable aortic contours. The previously noted focal  right upper lobe opacity is now not visible. No pneumothorax.  IMPRESSION: 1. Pulmonary edema and small pleural effusions. 2. Previously noted focal opacity in the right upper lobe is no longer visible.   Electronically Signed   By: Jorje Guild M.D.   On: 02/24/2014 09:10     Medications:     Scheduled Medications: . antiseptic oral rinse  7 mL Mouth Rinse q12n4p  . apixaban  5 mg Oral BID  . atorvastatin  40 mg Oral Daily  . calcitRIOL  0.25 mcg Oral Q M,W,F  . chlorhexidine  15 mL Mouth Rinse BID  . colchicine  0.3 mg Oral Daily  . darbepoetin (ARANESP) injection - NON-DIALYSIS  100 mcg Subcutaneous Q Wed-1800  . diltiazem  120 mg Oral Daily  . docusate sodium  100 mg Oral BID  . ferumoxytol  510 mg Intravenous Q3 days  . furosemide  160 mg Intravenous Q6H  . hydrALAZINE  50 mg Oral TID  . insulin aspart  0-15 Units Subcutaneous TID WC  . insulin glargine  35 Units Subcutaneous QHS  . ipratropium  0.5 mg Nebulization BID  . levalbuterol  0.63 mg Nebulization BID  . metolazone  5 mg Oral BID  . metoprolol  100 mg Oral BID  . multivitamin with minerals   Oral Daily  . predniSONE  20 mg Oral Q breakfast   Followed by  . [START ON 02/27/2014] predniSONE  10 mg Oral Q breakfast   Followed by  . [START ON 02/28/2014] predniSONE  10 mg Oral Q breakfast   Followed by  . [START ON 03/01/2014] predniSONE  5 mg Oral Q breakfast   Followed by  . [START ON 03/02/2014] predniSONE  5 mg Oral Q breakfast  . senna  1 tablet Oral BID  . sodium chloride  3 mL Intravenous Q12H   Infusions:    PRN Medications: acetaminophen **OR** acetaminophen, HYDROcodone-acetaminophen, HYDROmorphone (DILAUDID) injection, levalbuterol, ondansetron **OR** ondansetron (ZOFRAN) IV    Assessment:  1. Acute decompensated diastolic congestive heart failure, NYHA Class IV symptoms 2. CKD Stage IV 3. Permanent atrial fibrillation/AFL 4. DM2 5. HTN 6. HPL 7. Acute respiratory failure - now on  BIPAP  Plan/Discussion:    Weight down 10 lbs from yesterday. Volume status appears at baseline and renal function continues to trend up. Renal dosing diuretics. Hoping to avoid HD until fistula matures.   With the plan in the future probably going to be HD will transition patient from Eliquis to coumadin. Pharmacy will provide coumadin teachin.\g.  AF rate more controlled with cardizem CD 120 mg PO daily, HR  80-90s. SBP controlled.  Gout flare. He is on prednisone and colchicine. Will add allopurinol.   Rande Brunt NP-C 02/26/2014 8:29 AM  Patient seen and examined with Junie Bame, NP. We discussed all aspects of the encounter. I agree with the assessment and plan as stated above.   AF well rate controlled. Volume status improved but creatinine up significantly. It seems like he is getting close to HD. Will stop Eliquis and bridge with heparin over to coumadin.   Daniel Bensimhon,MD 10:00 AM

## 2014-02-26 NOTE — Progress Notes (Addendum)
[place] TEAM 1 - Stepdown/ICU TEAM Progress Note  HA PLACERES NOB:[id number] DOB: [birth year] DOA: 02/20/2014 PCP: Patricia Nettle, MD  Admit HPI / Brief Narrative: 58 yo with history of CKD, chronic atrial fibrillation, and chronic diastolic CHF, has been in atrial fibrillation since 6/13 when he failed a cardioversion and it was decided to leave him in atrial fibrillation. He has been on Eliquis. Most recent echo in 4/15 showed EF 94-76% with PA systolic pressure 85 mmHg.  Patient is being followed by Dr. Florene Glen from Nephrology as he is known to have chronic kidney disease with a creatinine around 4.  The pt presented to the CHF Clinic with volume overload picture, increased O2 demands, and significant dyspnea, so patient was transferred to ED.  In the ED he had a significantly elevated BNP, chest x-ray noted pulmonary edema, and exam noted worsening lower extremity edema, with significant orthopnea.   Since admission both the CHF Team and Nephrology have been following. He has been undergoing aggressive diuresis, with slow but steady clinical improvement.  His crt has also been rising as well, which is becoming the rate limiting issue.  An acute gout flair has become an issue over the last 2-3 days, and has thus far been recalcitrant to tx.    HPI/Subjective: C/o unrelenting pain in his R hand due to gout.  Current measures have not helped at all per his report.  He wants to take "what I take at home" though he can't tell me the name of the med.  He denies current cp, sob, n/v, or abdom pain.    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 -AV graft placed 10/27 but not yet ready for use -care as per Nephrology  -creatinine trending up secondary to diuresis: 3.94 > 3.96 > 4.73 > 4.80 > 5.4  ?RUL infiltrate - no subjective sx to suggest pulmonary infection, apart from dyspnea - repeat CXR with no focal opacity in the RUL noted therefore now off abx   Anemia of chronic kidney disease - continue aranesp and iron replacement per Renal  Severe pulmonary HTN - noted on recent 2 D ECHO   Chronic Atrial fibrillation -rate controlled with current medical tx  -Eliquis being stopped due to advancing renal failure, w/ transition to coumadin instead   Diabetes mellitus -CBG currently reasonably controlled - follow trend w/ steroid tx for gout   Hyperlipidemia -Continue with statin  HTN  Right elbow gout pain - UA 16 - on rapid steroid taper - pt uses cochicine at home - Nephrology dosing colchicine here as well - not much else to add at this time   Code Status: FULL Family Communication: no family present at time of exam Disposition Plan: tele bed   Consultants: CHF Team  Nephrology   Procedures: none  Antibiotics: none  DVT prophylaxis: Coumadin   Objective: Blood pressure 116/61, pulse 85, temperature 98.8 F (37.1 C), temperature source Oral, resp. rate 16, height 6' (1.829 m), weight 86.909 kg (191 lb 9.6 oz), SpO2 95 %.  Intake/Output Summary (Last 24 hours) at 02/26/14 1412 Last data filed at 02/26/14 1334  Gross per 24 hour  Intake   1143 ml  Output   1625 ml  Net   -482 ml   Exam: General: No acute respiratory distress - stable on nasal cannula only  Lungs: CTA th/o w/o wheeze  Cardiovascular: Regular rate without murmur gallop or rub normal S1 and S2 Abdomen: Nontender, nondistended, soft, bowel sounds positive, no rebound, no ascites, no appreciable mass Extremities: No  significant cyanosis, clubbing, or edema bilateral lower extremities  Data Reviewed: Basic Metabolic Panel:  Recent Labs Lab 02/22/14 0244 02/23/14 0547 02/24/14 0230 02/25/14 0307 02/26/14 0401  NA 144 142 138 135* 134*  K 4.1 3.9 3.8 3.3* 3.6*  CL 105 101 96 90* 88*  CO2 21 25 23 28 28   GLUCOSE 115* 136* 52* 161* 240*  BUN 80* 89* 92* 96* 104*  CREATININE 3.96* 4.73* 4.75* 4.80* 5.29*  CALCIUM 9.0 8.9 8.8 8.7 8.8  MG  --  2.2  --   --   --   PHOS 4.8*  --  5.1* 5.1*  --     Liver Function Tests:  Recent Labs Lab 02/22/14 0244 02/24/14 0230 02/25/14 0307  AST 25  --   --   ALT 25  --   --   ALKPHOS 77  --   --   BILITOT 1.0  --   --   PROT 6.9  --   --   ALBUMIN 2.8*  2.8* 2.6* 2.6*    CBC:  Recent Labs Lab 02/20/14 1515 02/21/14 0344 02/22/14 0244 02/24/14 0230 02/25/14 0307 02/26/14 0401  WBC 9.3 8.7 9.0 8.3 9.6 12.3*  NEUTROABS 7.2  --   --   --   --   --   HGB 9.2* 8.4* 8.8* 8.3* 8.3* 7.7*  HCT 29.4* 27.0* 29.0* 27.1* 26.9* 24.6*  MCV 87.5 87.4 89.8 86.9 86.2 87.2  PLT 296 281 283 253 301 278   CBG:  Recent Labs Lab 02/25/14 1217 02/25/14 1616 02/25/14 2146 02/26/14 0610 02/26/14 1050  GLUCAP 164* 147* 247* 173* 169*    Recent Results (from the past 240 hour(s))  MRSA PCR Screening     Status: None   Collection Time: 02/21/14 12:46 AM  Result Value Ref Range Status   MRSA by PCR NEGATIVE NEGATIVE Final    Comment:        The GeneXpert MRSA Assay (FDA approved for NASAL specimens only), is one component of a comprehensive MRSA colonization surveillance program. It is not intended to diagnose MRSA infection nor to guide or monitor treatment for MRSA infections.      Studies:  Recent x-ray studies have been reviewed in detail by the Attending Physician  Scheduled Meds:  Scheduled Meds: . antiseptic oral rinse  7 mL Mouth Rinse q12n4p  . atorvastatin  40 mg Oral Daily  . calcitRIOL  0.25 mcg Oral Q M,W,F  .  chlorhexidine  15 mL Mouth Rinse BID  . colchicine  0.6 mg Oral BID  . coumadin book   Does not apply Once  . darbepoetin (ARANESP) injection - NON-DIALYSIS  100 mcg Subcutaneous Q Wed-1800  . diltiazem  120 mg Oral Daily  . docusate sodium  100 mg Oral BID  . furosemide  160 mg Oral TID  . hydrALAZINE  50 mg Oral TID  . insulin aspart  0-15 Units Subcutaneous TID WC  . insulin glargine  35 Units Subcutaneous  QHS  . ipratropium  0.5 mg Nebulization BID  . levalbuterol  0.63 mg Nebulization BID  . metolazone  5 mg Oral BID  . metoprolol  100 mg Oral BID  . multivitamin with minerals   Oral Daily  . [START ON 02/27/2014] predniSONE  10 mg Oral Q breakfast   Followed by  . [START ON 02/28/2014] predniSONE  10 mg Oral Q breakfast   Followed by  . [START ON 03/01/2014] predniSONE  5 mg Oral Q breakfast   Followed by  . [START ON 03/02/2014] predniSONE  5 mg Oral Q breakfast  . senna  1 tablet Oral BID  . sodium chloride  3 mL Intravenous Q12H  . warfarin  10 mg Oral ONCE-1800  . warfarin   Does not apply Once  . Warfarin - Pharmacist Dosing Inpatient   Does not apply q1800    Time spent on care of this patient: 35 mins   [facility] T , MD   [facility] Office  [phone] Pager - Text Page per Amion as per below:  On-Call/Text Page:      [email or web address]      password [email or web address]  If 7PM-7AM, please contact night-coverage [email or web address] Password [email or web address] 02/26/2014, 2:12 PM   LOS: 6 days

## 2014-02-27 LAB — RENAL FUNCTION PANEL
Albumin: 2.7 g/dL — ABNORMAL LOW (ref 3.5–5.2)
Anion gap: 20 — ABNORMAL HIGH (ref 5–15)
BUN: 113 mg/dL — ABNORMAL HIGH (ref 6–23)
CO2: 29 mEq/L (ref 19–32)
Calcium: 9.1 mg/dL (ref 8.4–10.5)
Chloride: 88 mEq/L — ABNORMAL LOW (ref 96–112)
Creatinine, Ser: 5.73 mg/dL — ABNORMAL HIGH (ref 0.50–1.35)
GFR calc Af Amer: 11 mL/min — ABNORMAL LOW (ref >90)
GFR calc non Af Amer: 10 mL/min — ABNORMAL LOW (ref >90)
Glucose, Bld: 198 mg/dL — ABNORMAL HIGH (ref 70–99)
Phosphorus: 6.6 mg/dL — ABNORMAL HIGH (ref 2.3–4.6)
Potassium: 3.7 mEq/L (ref 3.7–5.3)
Sodium: 137 mEq/L (ref 137–147)

## 2014-02-27 LAB — GLUCOSE, CAPILLARY
GLUCOSE-CAPILLARY: 235 mg/dL — AB (ref 70–99)
GLUCOSE-CAPILLARY: 196 mg/dL — AB (ref 70–99)
GLUCOSE-CAPILLARY: 356 mg/dL — AB (ref 70–99)
Glucose-Capillary: 285 mg/dL — ABNORMAL HIGH (ref 70–99)

## 2014-02-27 LAB — HEPARIN LEVEL (UNFRACTIONATED): Heparin Unfractionated: 1.44 IU/mL — ABNORMAL HIGH (ref 0.30–0.70)

## 2014-02-27 LAB — PROTIME-INR
INR: 1.29 (ref 0.00–1.49)
PROTHROMBIN TIME: 16.2 s — AB (ref 11.6–15.2)

## 2014-02-27 LAB — HEPATITIS B SURFACE ANTIGEN: Hepatitis B Surface Ag: NEGATIVE

## 2014-02-27 LAB — APTT
aPTT: 60 seconds — ABNORMAL HIGH (ref 24–37)
aPTT: 53 seconds — ABNORMAL HIGH (ref 24–37)
aPTT: 80 seconds — ABNORMAL HIGH (ref 24–37)

## 2014-02-27 MED ORDER — HEPARIN (PORCINE) IN NACL 100-0.45 UNIT/ML-% IJ SOLN
1450.0000 [IU]/h | INTRAMUSCULAR | Status: DC
  Administered 2014-02-27: 1350 [IU]/h via INTRAVENOUS
  Administered 2014-02-28 – 2014-03-02 (×4): 1450 [IU]/h via INTRAVENOUS
  Filled 2014-02-27 (×7): qty 250

## 2014-02-27 MED ORDER — INSULIN GLARGINE 100 UNIT/ML ~~LOC~~ SOLN
44.0000 [IU] | Freq: Every day | SUBCUTANEOUS | Status: DC
  Administered 2014-02-27: 44 [IU] via SUBCUTANEOUS
  Filled 2014-02-27 (×2): qty 0.44

## 2014-02-27 MED ORDER — WARFARIN SODIUM 10 MG PO TABS
10.0000 mg | ORAL_TABLET | Freq: Once | ORAL | Status: AC
  Administered 2014-02-27: 10 mg via ORAL
  Filled 2014-02-27: qty 1

## 2014-02-27 MED ORDER — CALCIUM ACETATE 667 MG PO CAPS
667.0000 mg | ORAL_CAPSULE | Freq: Three times a day (TID) | ORAL | Status: DC
  Administered 2014-02-27 – 2014-03-03 (×12): 667 mg via ORAL
  Filled 2014-02-27 (×14): qty 1

## 2014-02-27 NOTE — Plan of Care (Signed)
Problem: Consults Goal: Heart Failure Patient Education (See Patient Education module for education specifics.)  Outcome: Completed/Met Date Met:  02/27/14 Goal: Tobacco Cessation referral if indicated Outcome: Not Applicable Date Met:  59/56/38 Goal: Diabetes Guidelines if Diabetic/Glucose > 140 If diabetic or lab glucose is > 140 mg/dl - Initiate Diabetes/Hyperglycemia Guidelines & Document Interventions  Outcome: Completed/Met Date Met:  02/27/14  Problem: Phase II Progression Outcomes Goal: Pain controlled Outcome: Completed/Met Date Met:  02/27/14 Goal: Dyspnea controlled with activity Outcome: Progressing Goal: Tolerating diet Outcome: Completed/Met Date Met:  02/27/14 Goal: Other Phase II Outcomes/Goals Outcome: Not Applicable Date Met:  75/64/33

## 2014-02-27 NOTE — Progress Notes (Addendum)
[facility] TEAM 1 - Stepdown/ICU TEAM Progress Note  Joseph [name] SNK:[id number] DOB: 1955-07-19 DOA: 02/20/2014 PCP: Patricia Nettle, MD  Admit HPI / Brief Narrative: 59 yo with history of CKD, chronic atrial fibrillation, and chronic diastolic CHF, has been in atrial fibrillation since 6/13 when he failed a cardioversion and it was decided to leave him in atrial fibrillation. He has been on Eliquis. Most recent echo in 4/15 showed EF 93-79% with PA systolic pressure 85 mmHg.  Patient is being followed by Dr. Florene Glen from Nephrology as he is known to have chronic kidney disease with a creatinine around 4.  The pt presented to the CHF Clinic with volume overload picture, increased O2 demands, and significant dyspnea, so patient was transferred to ED.  In the ED he had a significantly elevated BNP, chest x-ray noted pulmonary edema, and exam noted worsening lower extremity edema, with significant orthopnea.   Since admission both the CHF Team and Nephrology have been following. He has been undergoing aggressive diuresis, with slow but steady clinical improvement.  His crt has also been rising as well, which is becoming the rate limiting issue.  An acute gout flair has become an issue over the last 2-3 days, and has thus far been recalcitrant to tx.    HPI/Subjective: Feels like pain in his hand is improving.   Assessment/Plan:  Acute decompensation of chronic diastolic CHF -Most recent echo in April showing EF 02%, with diastolic dysfunction -CHF Team and Nephrology both assisting w/ care - Lasix transitioned to oral yesterday. 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 -AV graft placed 10/27 but not yet ready for use -care as per Nephrology  -creatinine trending up secondary to diuresis: 3.94 > 3.96 > 4.73 > 4.80 > 5.4  ?RUL infiltrate - no subjective sx to suggest pulmonary infection, apart from dyspnea - repeat CXR with no focal opacity in the RUL noted therefore now off abx   Anemia of chronic kidney disease - continue aranesp and iron replacement per Renal  Severe pulmonary HTN - noted on recent 2 D ECHO   Chronic Atrial fibrillation -rate controlled with current medical tx  -Eliquis being stopped due to advancing renal failure, w/ transition to coumadin instead - will need to be bridged with Heparin or Lovenox until therapeutic- patient not willing to do Lovenox injections at home therefore will need to keep in hospital until INR therapeutic.    Diabetes mellitus -CBG currently reasonably controlled - will increase Lantus today as sugars elevated this AM  Hyperlipidemia -Continue with statin  HTN  Right hand gout  - UA 16 - on rapid steroid taper - pt uses cochicine at home - Nephrology dosing colchicine here as well - not much else to add at this time   Code Status: FULL Family Communication: no family present at time of exam Disposition Plan: tele bed   Consultants: CHF Team  Nephrology   Procedures: none  Antibiotics: none  DVT prophylaxis: Coumadin   Objective: Blood pressure 124/65, pulse 63, temperature 98.4 F (36.9 C), temperature source Oral, resp. rate 18, height 6' (1.829 m), weight 86.955 kg (191  lb 11.2 oz), SpO2 93 %.  Intake/Output Summary (Last 24 hours) at 02/27/14 1514 Last data filed at 02/27/14 1352  Gross per 24 hour  Intake 1903.6 ml  Output   2000 ml  Net  -96.4 ml   Exam: General: No acute respiratory distress - stable on nasal cannula only  Lungs: CTA th/o w/o wheeze  Cardiovascular: Regular rate without murmur gallop or rub normal S1 and S2 Abdomen:  Nontender, nondistended, soft, bowel sounds positive, no rebound, no ascites, no appreciable mass Extremities: No significant cyanosis, clubbing, or edema bilateral lower extremities- mild erythema and tenderness on dorsum or right hand  Data Reviewed: Basic Metabolic Panel:  Recent Labs Lab 02/22/14 0244 02/23/14 0547 02/24/14 0230 02/25/14 0307 02/26/14 0401 02/27/14 0600  NA 144 142 138 135* 134* 137  K 4.1 3.9 3.8 3.3* 3.6* 3.7  CL 105 101 96 90* 88* 88*  CO2 21 25 23 28 28 29   GLUCOSE 115* 136* 52* 161* 240* 198*  BUN 80* 89* 92* 96* 104* 113*  CREATININE 3.96* 4.73* 4.75* 4.80* 5.29* 5.73*  CALCIUM 9.0 8.9 8.8 8.7 8.8 9.1  MG  --  2.2  --   --   --   --   PHOS 4.8*  --  5.1* 5.1*  --  6.6*    Liver Function Tests:  Recent Labs Lab 02/22/14 0244 02/24/14 0230 02/25/14 0307 02/27/14 0600  AST 25  --   --   --   ALT 25  --   --   --   ALKPHOS 77  --   --   --   BILITOT 1.0  --   --   --   PROT 6.9  --   --   --   ALBUMIN 2.8*  2.8* 2.6* 2.6* 2.7*    CBC:  Recent Labs Lab 02/20/14 1515 02/21/14 0344 02/22/14 0244 02/24/14 0230 02/25/14 0307 02/26/14 0401  WBC 9.3 8.7 9.0 8.3 9.6 12.3*  NEUTROABS 7.2  --   --   --   --   --   HGB 9.2* 8.4* 8.8* 8.3* 8.3* 7.7*  HCT 29.4* 27.0* 29.0* 27.1* 26.9* 24.6*  MCV 87.5 87.4 89.8 86.9 86.2 87.2  PLT 296 281 283 253 301 278   CBG:  Recent Labs Lab 02/26/14 1050 02/26/14 1606 02/26/14 2115 02/27/14 0547 02/27/14 1109  GLUCAP 169* 214* 374* 235* 196*    Recent Results (from the past 240 hour(s))  MRSA PCR Screening     Status: None   Collection Time: 02/21/14 12:46 AM  Result Value Ref Range Status   MRSA by PCR NEGATIVE NEGATIVE Final    Comment:        The GeneXpert MRSA Assay (FDA approved for NASAL specimens only), is one component of a comprehensive MRSA colonization surveillance program. It is not intended to diagnose MRSA infection nor to guide or monitor treatment for MRSA infections.       Studies:  Recent x-ray studies have been reviewed in detail by the Attending Physician  Scheduled Meds:  Scheduled Meds: . antiseptic oral rinse  7 mL Mouth Rinse q12n4p  . atorvastatin  40 mg Oral Daily  . calcitRIOL  0.25 mcg Oral Q M,W,F  . calcium acetate  667 mg Oral TID WC  . chlorhexidine  15 mL Mouth Rinse BID  . colchicine  0.6 mg Oral BID  . darbepoetin (ARANESP) injection - NON-DIALYSIS  100 mcg Subcutaneous Q Wed-1800  . diltiazem  120 mg  Oral Daily  . docusate sodium  100 mg Oral BID  . furosemide  160 mg Oral TID  . hydrALAZINE  50 mg Oral TID  . insulin aspart  0-15 Units Subcutaneous TID WC  . insulin glargine  40 Units Subcutaneous QHS  . ipratropium  0.5 mg Nebulization BID  . levalbuterol  0.63 mg Nebulization BID  . metolazone  5 mg Oral BID  . metoprolol  100 mg Oral BID  . multivitamin with minerals   Oral Daily  . [START ON 02/28/2014] predniSONE  10 mg Oral Q breakfast   Followed by  . [START ON 03/01/2014] predniSONE  5 mg Oral Q breakfast   Followed by  . [START ON 03/02/2014] predniSONE  5 mg Oral Q breakfast  . senna  1 tablet Oral BID  . sodium chloride  3 mL Intravenous Q12H  . warfarin  10 mg Oral ONCE-1800  . warfarin   Does not apply Once  . Warfarin - Pharmacist Dosing Inpatient   Does not apply q1800    Time spent on care of this patient: 63 mins   Debbe Odea , MD   [facility] Office  [phone] Pager - Text Page per Amion as per below:  On-Call/Text Page:      [email or web address]      password [email or web address]  If 7PM-7AM, please contact night-coverage [email or web address] Password [email or web address] 02/27/2014, 3:14 PM   LOS: 7 days

## 2014-02-27 NOTE — Plan of Care (Signed)
Problem: Phase I Progression Outcomes Goal: Up in chair, BRP Outcome: Completed/Met Date Met:  02/27/14 Goal: Initial discharge plan identified Outcome: Completed/Met Date Met:  02/27/14 Goal: Hemodynamically stable Outcome: Completed/Met Date Met:  02/27/14

## 2014-02-27 NOTE — Progress Notes (Signed)
ANTICOAGULATION CONSULT NOTE - Follow Up Consult  Pharmacy Consult for Heparin  Indication: atrial fibrillation  No Known Allergies  Patient Measurements: Height: 6' (182.9 cm) Weight: 191 lb 11.2 oz (86.955 kg) (b scale) IBW/kg (Calculated) : 77.6  Vital Signs: Temp: 98 F (36.7 C) (11/10 2114) Temp Source: Oral (11/10 2114) BP: 122/73 mmHg (11/10 2114) Pulse Rate: 85 (11/10 2114)  Labs:  Recent Labs  02/25/14 0307 02/26/14 0401  02/26/14 1610 02/27/14 0600 02/27/14 1515 02/27/14 2305  HGB 8.3* 7.7*  --   --   --   --   --   HCT 26.9* 24.6*  --   --   --   --   --   PLT 301 278  --   --   --   --   --   APTT  --   --   < > 44* 60* 53* 80*  LABPROT  --   --   --  19.8* 16.2*  --   --   INR  --   --   --  1.66* 1.29  --   --   HEPARINUNFRC  --   --   --  2.01* 1.44*  --   --   CREATININE 4.80* 5.29*  --   --  5.73*  --   --   < > = values in this interval not displayed.  Estimated Creatinine Clearance: 15.4 mL/min (by C-G formula based on Cr of 5.73).   Assessment: Heparin/warfarin for afib, using aPTT to guide heparin for now given previous apixaban. APTT is good at 80 tonight, other labs as above.  Goal of Therapy:  aPTT 66-102 seconds Monitor platelets by anticoagulation protocol: Yes   Plan:  -Continue heparin at 1450 units/hr  -APTT with AM labs -Daily aPTT/HL/CBC -Monitor for bleeding  Narda Bonds 02/27/2014,11:58 PM

## 2014-02-27 NOTE — Progress Notes (Signed)
Pt ambulated in room on 2L o2, pt dropped to 83%.  Pt placed back on 3L and o2 sats came up to 95%.  Pt states he feels better on 3L.  MD notified, will continue to monitor.

## 2014-02-27 NOTE — Consult Note (Addendum)
ANTICOAGULATION CONSULT NOTE - Follow up [facility] for Heparin and Coumadin Indication: atrial fibrillation  No Known Allergies  Patient Measurements: Height: 6' (182.9 cm) Weight: 191 lb 11.2 oz (86.955 kg) (b scale) IBW/kg (Calculated) : 77.6 Heparin Dosing Weight: 86kg  Vital Signs: Temp: 98 F (36.7 C) (11/10 1500) Temp Source: Oral (11/10 1500) BP: 127/70 mmHg (11/10 1500) Pulse Rate: 66 (11/10 1500)  Labs:  Recent Labs  02/25/14 0307 02/26/14 0401 02/26/14 1610 02/27/14 0600 02/27/14 1515  HGB 8.3* 7.7*  --   --   --   HCT 26.9* 24.6*  --   --   --   PLT 301 278  --   --   --   APTT  --   --  44* 60* 53*  LABPROT  --   --  19.8* 16.2*  --   INR  --   --  1.66* 1.29  --   HEPARINUNFRC  --   --  2.01* 1.44*  --   CREATININE 4.80* 5.29*  --  5.73*  --     Estimated Creatinine Clearance: 15.4 mL/min (by C-G formula based on Cr of 5.73).   Medical History: Past Medical History  Diagnosis Date  . Atrial fibrillation     a. 11/18/2011 s/p DCCV - 150J;  b. Anticoagulation w/ Apixaban;  c. 11/2011 back in afib->asymptomatic.  . H/O alcohol abuse     a. drinks heavily on the weekends.  . Hypertension   . Diabetes mellitus   . Heart murmur     a. 09/2011 Echo: EF 55-60%, Triv AI, Mild MR, mildly dil LA.  . Diabetic peripheral neuropathy   . CKD (chronic kidney disease), stage III   . CHF (congestive heart failure)   . Pneumonia    Medications: Heparin @ 1350 units/hr  Assessment: 58yom switched from apixaban to heparin and coumadin yesterday as he is approaching dialysis. Baseline heparin level and INR were elevated as expected with apixaban on board.   Initial aPTT is slightly below goal at 60. Heparin level today remains elevated but trending down appropriately. INR is now normalized after first dose of coumadin.  Anticipate that we will need to use aPTTs for heparin monitoring for at least another 24 hours.   Goal of Therapy: INR 2-3   APTT 66-102 seconds Heparin level 0.3-0.7 units/ml Monitor platelets by anticoagulation protocol: Yes   Plan:  1) Increase heparin to 1350 units/hr (done at 0734) 2) Check aPTT in 8 hours 3) Repeat coumadin 10mg  x 1 4) Follow up daily aPTT, heparin level, and INR   Georgina Peer 02/27/2014,3:56 PM  Addendum:   Aptt recheck is still below goal at 53. No bleeding issues noted. Will increase rate to 1450 units/hr.   Recheck aptt/HL in am.  Erin Hearing PharmD., BCPS Clinical Pharmacist Pager [phone] 02/27/2014 3:58 PM

## 2014-02-27 NOTE — Progress Notes (Signed)
Inpatient Diabetes Program Recommendations  AACE/ADA: New Consensus Statement on Inpatient Glycemic Control (2013)  Target Ranges:  Prepandial:   less than 140 mg/dL      Peak postprandial:   less than 180 mg/dL (1-2 hours)      Critically ill patients:  140 - 180 mg/dL   Results for MAKELL, CYR (MRN [id number]) as of 02/27/2014 11:29  Ref. Range 02/26/2014 06:10 02/26/2014 10:50 02/26/2014 16:06 02/26/2014 21:15 02/27/2014 05:47  Glucose-Capillary Latest Range: 70-99 mg/dL 173 (H) 169 (H) 214 (H) 374 (H) 235 (H)    Reason for Visit: elevated CBG  Diabetes history: Type 2 Outpatient Diabetes medications: Lantus 35 units/day Current orders for Inpatient glycemic control: Lantus 40units at hs, Novolog 0-15 units tid with meals   May want to consider adding mealtime insulin; Novolog 3 units tid with meals (along with current Novolog correction at meals) and add Novolog correction scale, 0-5 unit at hs.   Gentry Fitz, RN, BA, MHA, CDE Diabetes Coordinator Inpatient Diabetes Program  [phone] (Team Pager) [phone] [facility] Office) 02/27/2014 11:35 AM

## 2014-02-27 NOTE — Plan of Care (Signed)
Problem: Phase I Progression Outcomes Goal: Dyspnea controlled at rest (HF) Outcome: Completed/Met Date Met:  02/27/14 Goal: EF % per last Echo/documented,Core Reminder form on chart Outcome: Completed/Met Date Met:  02/27/14 EF 55-60% as of 07/2013 Goal: Other Phase I Outcomes/Goals Outcome: Not Applicable Date Met:  97/47/18

## 2014-02-27 NOTE — Progress Notes (Signed)
[facility] Rounding Note Subjective:  Remains on nasal cannula Breathing continues to improve with diuresis Changes yesterday to Po lasix 160 TID , continued metolazone Had 1400 UOP   Hand is better  Weight Trending 11/4 94.1 kg 11/5 93.7 11/6 90.4 11/7 90.1 11/8 no weight done 11/9 86.9 11/10 86.9  Physical examination BP 124/65 mmHg  Pulse 63  Temp(Src) 98.4 F (36.9 C) (Oral)  Resp 18  Ht 6' (1.829 m)  Wt 86.955 kg (191 lb 11.2 oz)  BMI 25.99 kg/m2  SpO2 93%  General appearance: alert and cooperative Looks comfortable on nasal cannula + JVD 4-5 cm Resp: diminished breath sounds post. Ant clear Cardio:Irregularly irregular rate 86  S1, S2 normal, no murmur, click, rub or gallop GI: Mild distension, protuberant. Soft. No focal tenderness. Liver edge down Small umbilical hernia Extremities: edema 1+ pretibial Left FA AVG + bruit with  less arm edema.  Incision remains clean and dry Neurologic: Grossly normal  Labs:  Recent Labs Lab 02/21/14 0344 02/22/14 0244 02/23/14 0547 02/24/14 0230 02/25/14 0307 02/26/14 0401 02/27/14 0600  NA 144 144 142 138 135* 134* 137  K 3.7 4.1 3.9 3.8 3.3* 3.6* 3.7  CL 106 105 101 96 90* 88* 88*  CO2 23 21 25 23 28 28 29   GLUCOSE 87 115* 136* 52* 161* 240* 198*  BUN 84* 80* 89* 92* 96* 104* 113*  CREATININE 3.94* 3.96* 4.73* 4.75* 4.80* 5.29* 5.73*  CALCIUM 8.6 9.0 8.9 8.8 8.7 8.8 9.1  PHOS  --  4.8*  --  5.1* 5.1*  --  6.6*    Recent Labs Lab 02/20/14 1515  02/22/14 0244 02/24/14 0230 02/25/14 0307 02/26/14 0401  WBC 9.3  < > 9.0 8.3 9.6 12.3*  NEUTROABS 7.2  --   --   --   --   --   HGB 9.2*  < > 8.8* 8.3* 8.3* 7.7*  HCT 29.4*  < > 29.0* 27.1* 26.9* 24.6*  MCV 87.5  < > 89.8 86.9 86.2 87.2  PLT 296  < > 283 253 301 278  < > = values in this interval not displayed.  Recent Labs Lab 02/26/14 1050 02/26/14 1606 02/26/14 2115 02/27/14 0547 02/27/14 1109  GLUCAP 169* 214* 374* 235* 196*    Results for BREVEN, GUIDROZ (MRN [id number]) as of 02/23/2014 08:51  Ref. Range 02/22/2014 02:44  Iron Latest Range: 42-135 ug/dL 22 (L)  UIBC Latest Range: 125-400 ug/dL 247  TIBC Latest Range: 215-435 ug/dL 269  Saturation Ratios Latest Range: 20-55 % 8 (L)   Results for LESLEY, GALENTINE (MRN [id number]) as of 02/23/2014 08:51  Ref. Range 02/21/2014 13:32  PTH Latest Range: 14-64 pg/mL 248 (H)    Studies/Results: No results found. Medications: . heparin 1,350 Units/hr (02/27/14 0734)   . antiseptic oral rinse  7 mL Mouth Rinse q12n4p  . atorvastatin  40 mg Oral Daily  . calcitRIOL  0.25 mcg Oral Q M,W,F  . chlorhexidine  15 mL Mouth Rinse BID  . colchicine  0.6 mg Oral BID  . darbepoetin (ARANESP) injection - NON-DIALYSIS  100 mcg Subcutaneous Q Wed-1800  . diltiazem  120 mg Oral Daily  . docusate sodium  100 mg Oral BID  . furosemide  160 mg Oral TID  . hydrALAZINE  50 mg Oral TID  . insulin aspart  0-15 Units Subcutaneous TID WC  . insulin glargine  40 Units Subcutaneous QHS  . ipratropium  0.5 mg Nebulization BID  .  levalbuterol  0.63 mg Nebulization BID  . metolazone  5 mg Oral BID  . metoprolol  100 mg Oral BID  . multivitamin with minerals   Oral Daily  . [START ON 02/28/2014] predniSONE  10 mg Oral Q breakfast   Followed by  . [START ON 03/01/2014] predniSONE  5 mg Oral Q breakfast   Followed by  . [START ON 03/02/2014] predniSONE  5 mg Oral Q breakfast  . senna  1 tablet Oral BID  . sodium chloride  3 mL Intravenous Q12H  . warfarin  10 mg Oral ONCE-1800  . warfarin   Does not apply Once  . Warfarin - Pharmacist Dosing Inpatient   Does not apply q1800  acetaminophen **OR** acetaminophen, HYDROcodone-acetaminophen, HYDROmorphone (DILAUDID) injection, levalbuterol, ondansetron **OR** ondansetron (ZOFRAN) IV  BACKGROUND 58 yo diabetic male with a history of stage 4 CKD prob due to diabetes mellitus, (followed by Dr. Florene Glen) s/p L FA AVG 10/27, chronic atrial  fibrillation Rx apixiban, and chronic diastolic CHF.He is admitted on this occasion with decompensated diastolic CHF. He reports medication compliance perhaps some dietary indiscretion. Has had challenges recently with fluid overload.   Assessment/Recommdations   Stage 5 CKD d/t DM -  Now is essentially ESRD-  S/P left FA AVG 10/27. Creatinine increasing but not really uremic. Responding to diuretics albeit pretty max doses. AVG will not be ready for use for a couple weeks.  I am hoping we can get/keep him out of trouble with the diuretics and set him up for OP dialysis the week of Thanksgiving- 2 weeks away. So far we are "maintaining" with imp in SOB.  He has O2 at home.   We can get him "primed" to start HD the week of Thanksgiving- I have called the [place] office to arrange   Acute decompensated diastolic CHF/Vol overload - on PO lasix 160 TID  plus metolazone 5 mg BID. Fair diuretic response. Weight definitely down-   Hypokalemia - d/t diuretics. S/p repletion- now on none       R/o PNA - RUL abnl no longer present on CXR 11/7 (just edema)  Pulmonary hypertension  Permanent atrial fibrillation - on Eliquis. Cardizem switched short -- >long acting by cards- going to change to coumadin  Anemia - Started Aranesp 100/week Tawny Asal). 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 Started low dose calcitriol;  phosphorus up- needs binder- will start phoslo   Acute gout - on colchicine (dose adjusted). He isn't sure if ever on allopurinol or Uloric. Required IV dilauded for wrist and elbow pain. Can't use NSAIDS.  Quick pred taper (20-20-10-10-5-5-0). Continue colchicine, increased dose. Uric acid 16 !!! Will eventually need uloric once flare clears   From renal standpoint, plan as above- getting arrangements made to start HD the week of November 23 (13 days away) Would continue diuretcs at current doses-  continue calcitriol/phoslo as OP.  Could be discharged when stable from other sources.  Renal will follow at a distance, call us if specific questions   [place] [phone] pager 02/27/2014, 12:16 PM

## 2014-02-27 NOTE — Progress Notes (Signed)
Progress Note   Subjective:    58 yo with history of CKD, chronic atrial fibrillation, and chronic diastolic CHF was referred to CHF clinic for evaluation of CHF and elevated PA pressure on echo. Patient's creatinine has been as high as 4.9, probably due to HTN and diabetes. He follows with Dr. Florene Glen. He has been in atrial fibrillation since 6/13 when he failed a cardioversion and it was decided to leave him in atrial fibrillation. He has been on Eliquis. Most recent echo in 4/15 showed EF 24-82% with PA systolic pressure 85 mmHg. He has had difficulty recently with volume overload.   RHC in 6/15 showing elevated right and left heart filling pressures with moderate pulmonary hypertension, primarily pulmonary venous hypertension.   Admitted with decompensated HF and progressive renal failure. Placed on lasix 120 mg IV three times a day. Metolazone added. Changed to PO lasix yesterday. Weight stable. Denies SOB, orthopnea or CP. Started on couamdin  Creatinine trending up table at 3.9>4.73--> 4.75>4.8>5.4>5.7   Objective:    Vital Signs:   Temp:  [98.4 F (36.9 C)-98.8 F (37.1 C)] 98.4 F (36.9 C) (11/10 0551) Pulse Rate:  [63-104] 63 (11/10 0551) Resp:  [16-18] 18 (11/10 0551) BP: (116-129)/(61-74) 124/65 mmHg (11/10 0551) SpO2:  [93 %-99 %] 93 % (11/10 0934) Weight:  [191 lb 11.2 oz (86.955 kg)] 191 lb 11.2 oz (86.955 kg) (11/10 0551) Last BM Date: 02/24/14  Weight change: Filed Weights   02/25/14 1429 02/26/14 0644 02/27/14 0551  Weight: 201 lb 4.5 oz (91.3 kg) 191 lb 9.6 oz (86.909 kg) 191 lb 11.2 oz (86.955 kg)   Intake/Output:   Intake/Output Summary (Last 24 hours) at 02/27/14 0955 Last data filed at 02/27/14 0910  Gross per 24 hour  Intake 1906.6 ml  Output   1875 ml  Net   31.6 ml     Physical Exam: General:  Sitting on side of bed; NAD Neck: supple. JVP 8-9;. Carotids 2+ bilat; no bruits. No lymphadenopathy or thryomegaly appreciated. Cor: PMI  nondisplaced. Irregular rate & rhythm. No rubs, gallops or murmurs. Lungs: clear, on 3L [place] Abdomen: soft, nontender, distended. No hepatosplenomegaly. No bruits or masses. Good bowel sounds. Extremities: no cyanosis, clubbing, rash, no edema Neuro: alert & orientedx3, cranial nerves grossly intact. moves all 4 extremities w/o difficulty. Affect pleasant  Telemetry: A flutter 80-90s  Labs: Basic Metabolic Panel:  Recent Labs Lab 02/22/14 0244 02/23/14 0547 02/24/14 0230 02/25/14 0307 02/26/14 0401 02/27/14 0600  NA 144 142 138 135* 134* 137  K 4.1 3.9 3.8 3.3* 3.6* 3.7  CL 105 101 96 90* 88* 88*  CO2 21 25 23 28 28 29   GLUCOSE 115* 136* 52* 161* 240* 198*  BUN 80* 89* 92* 96* 104* 113*  CREATININE 3.96* 4.73* 4.75* 4.80* 5.29* 5.73*  CALCIUM 9.0 8.9 8.8 8.7 8.8 9.1  MG  --  2.2  --   --   --   --   PHOS 4.8*  --  5.1* 5.1*  --  6.6*   Liver Function Tests:  Recent Labs Lab 02/22/14 0244 02/24/14 0230 02/25/14 0307 02/27/14 0600  AST 25  --   --   --   ALT 25  --   --   --   ALKPHOS 77  --   --   --   BILITOT 1.0  --   --   --   PROT 6.9  --   --   --   ALBUMIN 2.8*  2.8* 2.6* 2.6* 2.7*   CBC:  Recent Labs Lab 02/20/14 1515 02/21/14 0344 02/22/14 0244 02/24/14 0230 02/25/14 0307 02/26/14 0401  WBC 9.3 8.7 9.0 8.3 9.6 12.3*  NEUTROABS 7.2  --   --   --   --   --   HGB 9.2* 8.4* 8.8* 8.3* 8.3* 7.7*  HCT 29.4* 27.0* 29.0* 27.1* 26.9* 24.6*  MCV 87.5 87.4 89.8 86.9 86.2 87.2  PLT 296 281 283 253 301 278    Cardiac Enzymes: No results for input(s): CKTOTAL, CKMB, CKMBINDEX, TROPONINI in the last 168 hours.  BNP: BNP (last 3 results)  Recent Labs  01/15/14 1221 02/20/14 1515 02/24/14 1145  PROBNP 9297.0* 15468.0* 10410.0*     Other results:  EKG:   Imaging: No results found.   Medications:     Scheduled Medications: . antiseptic oral rinse  7 mL Mouth Rinse q12n4p  . atorvastatin  40 mg Oral Daily  . calcitRIOL  0.25 mcg Oral Q  M,W,F  . chlorhexidine  15 mL Mouth Rinse BID  . colchicine  0.6 mg Oral BID  . darbepoetin (ARANESP) injection - NON-DIALYSIS  100 mcg Subcutaneous Q Wed-1800  . diltiazem  120 mg Oral Daily  . docusate sodium  100 mg Oral BID  . furosemide  160 mg Oral TID  . hydrALAZINE  50 mg Oral TID  . insulin aspart  0-15 Units Subcutaneous TID WC  . insulin glargine  40 Units Subcutaneous QHS  . ipratropium  0.5 mg Nebulization BID  . levalbuterol  0.63 mg Nebulization BID  . metolazone  5 mg Oral BID  . metoprolol  100 mg Oral BID  . multivitamin with minerals   Oral Daily  . predniSONE  10 mg Oral Q breakfast   Followed by  . [START ON 02/28/2014] predniSONE  10 mg Oral Q breakfast   Followed by  . [START ON 03/01/2014] predniSONE  5 mg Oral Q breakfast   Followed by  . [START ON 03/02/2014] predniSONE  5 mg Oral Q breakfast  . senna  1 tablet Oral BID  . sodium chloride  3 mL Intravenous Q12H  . warfarin   Does not apply Once  . Warfarin - Pharmacist Dosing Inpatient   Does not apply q1800   Infusions: . heparin 1,350 Units/hr (02/27/14 0734)    PRN Medications: acetaminophen **OR** acetaminophen, HYDROcodone-acetaminophen, HYDROmorphone (DILAUDID) injection, levalbuterol, ondansetron **OR** ondansetron (ZOFRAN) IV    Assessment:  1. Acute decompensated diastolic congestive heart failure, NYHA Class IV symptoms 2. CKD Stage IV 3. Permanent atrial fibrillation/AFL 4. DM2 5. HTN 6. HPL 7. Acute respiratory failure - now on BIPAP  Plan/Discussion:    Transitioned to PO lasix yesterday and weight stable. Unfortunately renal function continues to trend up and he still appears mildly volume overloaded. Renal dosing diuretics. We are hopign to get him out of the hospital and home for a couple of weeks for fistula to mature (around Thanksgiving) before needing to use.   He is now on Coumadin and pharmacy will provide education later today.   AF rate control on cardizem. Continue  to follow.   Gout flare. He is on prednisone and colchicine. Will add allopurinol.   Junie Bame B NP-C 02/27/2014 9:55 AM  Patient seen and examined with Junie Bame, NP. We discussed all aspects of the encounter. I agree with the assessment and plan as stated above.   AF rate controlled. Switching Eliqiuis to coumadin with heparin bridge. Volume management per  Renal team.   Benay Spice 11:42 AM

## 2014-02-28 DIAGNOSIS — N179 Acute kidney failure, unspecified: Secondary | ICD-10-CM

## 2014-02-28 LAB — RENAL FUNCTION PANEL
Albumin: 2.7 g/dL — ABNORMAL LOW (ref 3.5–5.2)
Anion gap: 17 — ABNORMAL HIGH (ref 5–15)
BUN: 117 mg/dL — ABNORMAL HIGH (ref 6–23)
CALCIUM: 8.9 mg/dL (ref 8.4–10.5)
CO2: 31 mEq/L (ref 19–32)
CREATININE: 5.05 mg/dL — AB (ref 0.50–1.35)
Chloride: 86 mEq/L — ABNORMAL LOW (ref 96–112)
GFR calc Af Amer: 13 mL/min — ABNORMAL LOW (ref >90)
GFR, EST NON AFRICAN AMERICAN: 11 mL/min — AB (ref >90)
Glucose, Bld: 251 mg/dL — ABNORMAL HIGH (ref 70–99)
Phosphorus: 5.3 mg/dL — ABNORMAL HIGH (ref 2.3–4.6)
Potassium: 3.4 mEq/L — ABNORMAL LOW (ref 3.7–5.3)
Sodium: 134 mEq/L — ABNORMAL LOW (ref 137–147)

## 2014-02-28 LAB — GLUCOSE, CAPILLARY
GLUCOSE-CAPILLARY: 217 mg/dL — AB (ref 70–99)
Glucose-Capillary: 197 mg/dL — ABNORMAL HIGH (ref 70–99)
Glucose-Capillary: 201 mg/dL — ABNORMAL HIGH (ref 70–99)
Glucose-Capillary: 391 mg/dL — ABNORMAL HIGH (ref 70–99)

## 2014-02-28 LAB — APTT: aPTT: 80 seconds — ABNORMAL HIGH (ref 24–37)

## 2014-02-28 LAB — PROTIME-INR
INR: 1.43 (ref 0.00–1.49)
Prothrombin Time: 17.6 seconds — ABNORMAL HIGH (ref 11.6–15.2)

## 2014-02-28 LAB — HEPARIN LEVEL (UNFRACTIONATED): Heparin Unfractionated: 0.75 IU/mL — ABNORMAL HIGH (ref 0.30–0.70)

## 2014-02-28 MED ORDER — WARFARIN SODIUM 10 MG PO TABS
10.0000 mg | ORAL_TABLET | Freq: Once | ORAL | Status: AC
  Administered 2014-02-28: 10 mg via ORAL
  Filled 2014-02-28: qty 1

## 2014-02-28 MED ORDER — INSULIN GLARGINE 100 UNIT/ML ~~LOC~~ SOLN
50.0000 [IU] | Freq: Every day | SUBCUTANEOUS | Status: DC
  Administered 2014-02-28 – 2014-03-02 (×3): 50 [IU] via SUBCUTANEOUS
  Filled 2014-02-28 (×4): qty 0.5

## 2014-02-28 MED ORDER — POTASSIUM CHLORIDE CRYS ER 20 MEQ PO TBCR
40.0000 meq | EXTENDED_RELEASE_TABLET | Freq: Two times a day (BID) | ORAL | Status: DC
  Administered 2014-02-28 – 2014-03-02 (×6): 40 meq via ORAL
  Filled 2014-02-28 (×8): qty 2

## 2014-02-28 NOTE — Progress Notes (Signed)
Patient and wife refused Aranesp. Stated that they wanted to speak to the MD and get a better understanding about what is doing on and the new medications that are being prescribed.

## 2014-02-28 NOTE — Discharge Instructions (Signed)

## 2014-02-28 NOTE — Progress Notes (Signed)
[facility] PROGRESS NOTE  Joseph [name] OZH:[id number] DOB: October 22, 1955 DOA: 02/20/2014 PCP: Patricia Nettle, MD  Assessment/Plan: 1. Acutely decompensated diastolic CHF 1. CHF team following 2. On PO lasix 2. Acute on chronic hypoxic respiratory failure 1. Pt with 2LNC baseline O2 2. Cont wean O2 as tolerated 3. CKD 4-5 1. Nephrology was consulted and had been following 4. Questionable RUL infiltrate 1. Monitor for now as no focal process was identified on f/u cxr 5. Anemia of chronic diseases 1. On aranesp and cont on iron replacement 6. Pulm HTN 1. O2 dependent 7. Chronic Afib 1. Rate controlled 2. Pt transitioned to coumadin 3. Pt not willing to take lovenox, thus is on heparin gtt until INR therapeutic 8. DM 1. Glucose stable, albeit suboptimal 2. Gradually titrate lantus as tolerated 3. On SSI 9. HLD 1. On statin 10. HTN 1. bp stable 2. Cont monitor 11. Gout 1. Given steroid taper with colchicine 12. DVT prophylaxis 1. Therapeutic anticoagulation per above  Code Status: Full Family Communication: Pt in room Disposition Plan: Pending   Consultants:  Nephrology  Cardiology  Procedures:    Antibiotics:    HPI/Subjective: No complaints. No acute events noted  Objective: Filed Vitals:   02/28/14 0631 02/28/14 0824 02/28/14 1019 02/28/14 1400  BP: 128/77  129/69 145/73  Pulse: 50  75 75  Temp: 98 F (36.7 C)   97.8 F (36.6 C)  TempSrc: Oral   Oral  Resp: 18   18  Height:      Weight: 87.045 kg (191 lb 14.4 oz)     SpO2: 100% 98%  100%    Intake/Output Summary (Last 24 hours) at 02/28/14 1606 Last data filed at 02/28/14 1433  Gross per 24 hour  Intake 1110.14 ml  Output   4075 ml  Net -2964.86 ml   Filed Weights   02/26/14 0644 02/27/14 0551 02/28/14 0631  Weight: 86.909 kg (191 lb 9.6 oz) 86.955 kg (191 lb 11.2 oz) 87.045 kg (191 lb 14.4 oz)    Exam:   General:  Awake, in nad  Cardiovascular: regular, s1,  s2  Respiratory: normal resp effort, no wheezing  Abdomen: soft,nondistended  Musculoskeletal: perfused, no clubbing   Data Reviewed: Basic Metabolic Panel:  Recent Labs Lab 02/22/14 0244 02/23/14 0547 02/24/14 0230 02/25/14 0307 02/26/14 0401 02/27/14 0600 02/28/14 0453  NA 144 142 138 135* 134* 137 134*  K 4.1 3.9 3.8 3.3* 3.6* 3.7 3.4*  CL 105 101 96 90* 88* 88* 86*  CO2 21 25 23 28 28 29 31   GLUCOSE 115* 136* 52* 161* 240* 198* 251*  BUN 80* 89* 92* 96* 104* 113* 117*  CREATININE 3.96* 4.73* 4.75* 4.80* 5.29* 5.73* 5.05*  CALCIUM 9.0 8.9 8.8 8.7 8.8 9.1 8.9  MG  --  2.2  --   --   --   --   --   PHOS 4.8*  --  5.1* 5.1*  --  6.6* 5.3*   Liver Function Tests:  Recent Labs Lab 02/22/14 0244 02/24/14 0230 02/25/14 0307 02/27/14 0600 02/28/14 0453  AST 25  --   --   --   --   ALT 25  --   --   --   --   ALKPHOS 77  --   --   --   --   BILITOT 1.0  --   --   --   --   PROT 6.9  --   --   --   --  ALBUMIN 2.8*  2.8* 2.6* 2.6* 2.7* 2.7*   No results for input(s): LIPASE, AMYLASE in the last 168 hours. No results for input(s): AMMONIA in the last 168 hours. CBC:  Recent Labs Lab 02/22/14 0244 02/24/14 0230 02/25/14 0307 02/26/14 0401  WBC 9.0 8.3 9.6 12.3*  HGB 8.8* 8.3* 8.3* 7.7*  HCT 29.0* 27.1* 26.9* 24.6*  MCV 89.8 86.9 86.2 87.2  PLT 283 253 301 278   Cardiac Enzymes: No results for input(s): CKTOTAL, CKMB, CKMBINDEX, TROPONINI in the last 168 hours. BNP (last 3 results)  Recent Labs  01/15/14 1221 02/20/14 1515 02/24/14 1145  PROBNP 9297.0* 15468.0* 10410.0*   CBG:  Recent Labs Lab 02/27/14 1109 02/27/14 1638 02/27/14 2120 02/28/14 0613 02/28/14 1140  GLUCAP 196* 285* 356* 197* 217*    Recent Results (from the past 240 hour(s))  MRSA PCR Screening     Status: None   Collection Time: 02/21/14 12:46 AM  Result Value Ref Range Status   MRSA by PCR NEGATIVE NEGATIVE Final    Comment:        The GeneXpert MRSA Assay  (FDA approved for NASAL specimens only), is one component of a comprehensive MRSA colonization surveillance program. It is not intended to diagnose MRSA infection nor to guide or monitor treatment for MRSA infections.      Studies: No results found.  Scheduled Meds: . antiseptic oral rinse  7 mL Mouth Rinse q12n4p  . atorvastatin  40 mg Oral Daily  . calcitRIOL  0.25 mcg Oral Q M,W,F  . calcium acetate  667 mg Oral TID WC  . chlorhexidine  15 mL Mouth Rinse BID  . colchicine  0.6 mg Oral BID  . darbepoetin (ARANESP) injection - NON-DIALYSIS  100 mcg Subcutaneous Q Wed-1800  . diltiazem  120 mg Oral Daily  . docusate sodium  100 mg Oral BID  . furosemide  160 mg Oral TID  . hydrALAZINE  50 mg Oral TID  . insulin aspart  0-15 Units Subcutaneous TID WC  . insulin glargine  44 Units Subcutaneous QHS  . ipratropium  0.5 mg Nebulization BID  . levalbuterol  0.63 mg Nebulization BID  . metolazone  5 mg Oral BID  . metoprolol  100 mg Oral BID  . multivitamin with minerals   Oral Daily  . potassium chloride  40 mEq Oral BID  . [START ON 03/01/2014] predniSONE  5 mg Oral Q breakfast   Followed by  . [START ON 03/02/2014] predniSONE  5 mg Oral Q breakfast  . senna  1 tablet Oral BID  . sodium chloride  3 mL Intravenous Q12H  . warfarin  10 mg Oral ONCE-1800  . warfarin   Does not apply Once  . Warfarin - Pharmacist Dosing Inpatient   Does not apply q1800   Continuous Infusions: . heparin 1,450 Units/hr (02/28/14 1308)    Principal Problem:   Acute on chronic diastolic congestive heart failure Active Problems:   Atrial fibrillation-permanent   Diabetes mellitus with nephropathy   Essential hypertension   Hyperlipidemia   Obstructive sleep apnea   Chronic kidney disease (CKD), stage IV (severe)   Acute respiratory failure   Acute decompensated heart failure   Pulmonary hypertension   Acute on chronic respiratory failure with hypoxia   Chronic kidney disease, stage III  (moderate)   Diabetes mellitus, controlled   HLD (hyperlipidemia)   CKD (chronic kidney disease)  Time spent: 60min  Daylynn Stumpp, [facility] Pager [phone]. If 7PM-7AM,  please contact night-coverage at [email or web address], password [facility] 02/28/2014, 4:06 PM  LOS: 8 days

## 2014-02-28 NOTE — Progress Notes (Signed)
Report given to receiving RN. Patient in bed resting with family at bedside. No verbal complaints and no signs of distress noted.

## 2014-02-28 NOTE — Consult Note (Signed)
ANTICOAGULATION CONSULT NOTE - Follow up [place] for Heparin and Coumadin Indication: atrial fibrillation  No Known Allergies  Patient Measurements: Height: 6' (182.9 cm) Weight: 191 lb 14.4 oz (87.045 kg) IBW/kg (Calculated) : 77.6 Heparin Dosing Weight: 86kg  Vital Signs: Temp: 98 F (36.7 C) (11/11 0631) Temp Source: Oral (11/11 0631) BP: 129/69 mmHg (11/11 1019) Pulse Rate: 75 (11/11 1019)  Labs:  Recent Labs  02/26/14 0401  02/26/14 1610 02/27/14 0600 02/27/14 1515 02/27/14 2305 02/28/14 0453  HGB 7.7*  --   --   --   --   --   --   HCT 24.6*  --   --   --   --   --   --   PLT 278  --   --   --   --   --   --   APTT  --   < > 44* 60* 53* 80* 80*  LABPROT  --   --  19.8* 16.2*  --   --  17.6*  INR  --   --  1.66* 1.29  --   --  1.43  HEPARINUNFRC  --   --  2.01* 1.44*  --   --  0.75*  CREATININE 5.29*  --   --  5.73*  --   --  5.05*  < > = values in this interval not displayed.  Estimated Creatinine Clearance: 17.5 mL/min (by C-G formula based on Cr of 5.05).  Medications: Heparin @ 1450 units/hr  Assessment: 58yom switched from apixaban to heparin and coumadin as he is approaching dialysis. Baseline heparin level and INR were elevated as expected with apixaban on board.   APTT is therapeutic at 80. Heparin level is slightly above goal - anticipate that the aPTT and heparin level will correlate tomorrow and then can use just heparin levels for monitoring. INR remains below goal but is trending up with 10mg  doses. No CBC. No bleeding reported.  Goal of Therapy: INR 2-3  APTT 66-102 seconds Heparin level 0.3-0.7 units/ml Monitor platelets by anticoagulation protocol: Yes   Plan:  1) Continue heparin at 1450 units/hr 2) Repeat coumadin 10mg  x 1 4) Follow up daily aPTT, heparin level, and INR   Deboraha Sprang 02/28/2014,11:50 AM  Addendum:   Aptt recheck is still below goal at 53. No bleeding issues noted. Will increase rate  to 1450 units/hr.   Recheck aptt/HL in am.  Erin Hearing PharmD., BCPS Clinical Pharmacist Pager [phone] 02/28/2014 11:50 AM

## 2014-02-28 NOTE — Progress Notes (Addendum)
Inpatient Diabetes Program Recommendations  AACE/ADA: New Consensus Statement on Inpatient Glycemic Control (2013)  Target Ranges:  Prepandial:   less than 140 mg/dL      Peak postprandial:   less than 180 mg/dL (1-2 hours)      Critically ill patients:  140 - 180 mg/dL   Results for NAFTULA, DONAHUE (MRN [id number]) as of 02/28/2014 08:38  Ref. Range 02/27/2014 05:47 02/27/2014 11:09 02/27/2014 16:38 02/27/2014 21:20 02/28/2014 06:13  Glucose-Capillary Latest Range: 70-99 mg/dL 235 (H) 196 (H) 285 (H) 356 (H) 197 (H)    Reason for Visit: elevated CBG  Diabetes history: Type 2 Outpatient Diabetes medications: Lantus 35 units/day Current orders for Inpatient glycemic control: Lantus 44units at hs, Novolog 0-15 units tid with meals  Based on current renal function, recent increase in Lantus to 44 units, and prednisone decrease over the next few days, no recommendations at this time.  If post prandial blood sugars remain above 200mg /dl, may consider adding Novolog 2-3 units tid with meals (along with current Novolog correction at meals).  Gentry Fitz, RN, BA, MHA, CDE Diabetes Coordinator Inpatient Diabetes Program  [phone] (Team Pager) [phone] [facility]) 02/28/2014 8:46 AM

## 2014-02-28 NOTE — Progress Notes (Signed)
Non-compliant with fluid restriction.

## 2014-02-28 NOTE — Progress Notes (Signed)
Progress Note   Subjective:    Denies dyspnea. Volume status stable. Cr 5.1   Objective:    Vital Signs:   Temp:  [98 F (36.7 C)] 98 F (36.7 C) (11/11 0631) Pulse Rate:  [50-85] 50 (11/11 0631) Resp:  [18] 18 (11/11 0631) BP: (122-128)/(70-77) 128/77 mmHg (11/11 0631) SpO2:  [93 %-100 %] 100 % (11/11 0631) Weight:  [87.045 kg (191 lb 14.4 oz)] 87.045 kg (191 lb 14.4 oz) (11/11 0631) Last BM Date: 02/24/14  Weight change: Filed Weights   02/26/14 0644 02/27/14 0551 02/28/14 0631  Weight: 86.909 kg (191 lb 9.6 oz) 86.955 kg (191 lb 11.2 oz) 87.045 kg (191 lb 14.4 oz)   Intake/Output:   Intake/Output Summary (Last 24 hours) at 02/28/14 0819 Last data filed at 02/28/14 0659  Gross per 24 hour  Intake 1350.14 ml  Output   3125 ml  Net -1774.86 ml     Physical Exam: General:  Sitting on side of bed; NAD Neck: supple. JVP 8;. Carotids 2+ bilat; no bruits. No lymphadenopathy or thryomegaly appreciated. Cor: PMI nondisplaced. Irregular rate & rhythm. No rubs, gallops or murmurs. Lungs: clear, on 3L [place] Abdomen: soft, nontender, distended. No hepatosplenomegaly. No bruits or masses. Good bowel sounds. Extremities: no cyanosis, clubbing, rash, no edema Neuro: alert & orientedx3, cranial nerves grossly intact. moves all 4 extremities w/o difficulty. Affect pleasant  Telemetry: A flutter 80-90s  Labs: Basic Metabolic Panel:  Recent Labs Lab 02/22/14 0244 02/23/14 0547 02/24/14 0230 02/25/14 0307 02/26/14 0401 02/27/14 0600 02/28/14 0453  NA 144 142 138 135* 134* 137 134*  K 4.1 3.9 3.8 3.3* 3.6* 3.7 3.4*  CL 105 101 96 90* 88* 88* 86*  CO2 21 25 23 28 28 29 31   GLUCOSE 115* 136* 52* 161* 240* 198* 251*  BUN 80* 89* 92* 96* 104* 113* 117*  CREATININE 3.96* 4.73* 4.75* 4.80* 5.29* 5.73* 5.05*  CALCIUM 9.0 8.9 8.8 8.7 8.8 9.1 8.9  MG  --  2.2  --   --   --   --   --   PHOS 4.8*  --  5.1* 5.1*  --  6.6* 5.3*   Liver Function Tests:  Recent Labs Lab  02/22/14 0244 02/24/14 0230 02/25/14 0307 02/27/14 0600 02/28/14 0453  AST 25  --   --   --   --   ALT 25  --   --   --   --   ALKPHOS 77  --   --   --   --   BILITOT 1.0  --   --   --   --   PROT 6.9  --   --   --   --   ALBUMIN 2.8*  2.8* 2.6* 2.6* 2.7* 2.7*   CBC:  Recent Labs Lab 02/22/14 0244 02/24/14 0230 02/25/14 0307 02/26/14 0401  WBC 9.0 8.3 9.6 12.3*  HGB 8.8* 8.3* 8.3* 7.7*  HCT 29.0* 27.1* 26.9* 24.6*  MCV 89.8 86.9 86.2 87.2  PLT 283 253 301 278    Cardiac Enzymes: No results for input(s): CKTOTAL, CKMB, CKMBINDEX, TROPONINI in the last 168 hours.  BNP: BNP (last 3 results)  Recent Labs  01/15/14 1221 02/20/14 1515 02/24/14 1145  PROBNP 9297.0* 15468.0* 10410.0*     Other results:    Imaging: No results found.   Medications:     Scheduled Medications: . antiseptic oral rinse  7 mL Mouth Rinse q12n4p  . atorvastatin  40 mg Oral Daily  .  calcitRIOL  0.25 mcg Oral Q M,W,F  . calcium acetate  667 mg Oral TID WC  . chlorhexidine  15 mL Mouth Rinse BID  . colchicine  0.6 mg Oral BID  . darbepoetin (ARANESP) injection - NON-DIALYSIS  100 mcg Subcutaneous Q Wed-1800  . diltiazem  120 mg Oral Daily  . docusate sodium  100 mg Oral BID  . furosemide  160 mg Oral TID  . hydrALAZINE  50 mg Oral TID  . insulin aspart  0-15 Units Subcutaneous TID WC  . insulin glargine  44 Units Subcutaneous QHS  . ipratropium  0.5 mg Nebulization BID  . levalbuterol  0.63 mg Nebulization BID  . metolazone  5 mg Oral BID  . metoprolol  100 mg Oral BID  . multivitamin with minerals   Oral Daily  . predniSONE  10 mg Oral Q breakfast   Followed by  . [START ON 03/01/2014] predniSONE  5 mg Oral Q breakfast   Followed by  . [START ON 03/02/2014] predniSONE  5 mg Oral Q breakfast  . senna  1 tablet Oral BID  . sodium chloride  3 mL Intravenous Q12H  . warfarin   Does not apply Once  . Warfarin - Pharmacist Dosing Inpatient   Does not apply q1800    Infusions: . heparin 1,450 Units/hr (02/27/14 1658)    PRN Medications: acetaminophen **OR** acetaminophen, HYDROcodone-acetaminophen, HYDROmorphone (DILAUDID) injection, levalbuterol, ondansetron **OR** ondansetron (ZOFRAN) IV    Assessment:  1. Acute decompensated diastolic congestive heart failure, NYHA Class IV symptoms 2. CKD Stage IV 3. Permanent atrial fibrillation/AFL 4. DM2 5. HTN 6. HPL 7. Acute respiratory failure - now on BIPAP  Plan/Discussion:     AF rate controlled. Switching Eliqiuis to coumadin with heparin bridge would continue heparin until INR >= 2.0. Volume management per Renal team. He will need close f/u in [facility] to assess volume status until HD started.   We will follow at a distance. Call with questions.   Briella Hobday,MD 8:19 AM

## 2014-03-01 DIAGNOSIS — E1121 Type 2 diabetes mellitus with diabetic nephropathy: Secondary | ICD-10-CM

## 2014-03-01 LAB — GLUCOSE, CAPILLARY
GLUCOSE-CAPILLARY: 110 mg/dL — AB (ref 70–99)
Glucose-Capillary: 136 mg/dL — ABNORMAL HIGH (ref 70–99)
Glucose-Capillary: 188 mg/dL — ABNORMAL HIGH (ref 70–99)
Glucose-Capillary: 282 mg/dL — ABNORMAL HIGH (ref 70–99)

## 2014-03-01 LAB — APTT: aPTT: 110 seconds — ABNORMAL HIGH (ref 24–37)

## 2014-03-01 LAB — CBC
HCT: 29.2 % — ABNORMAL LOW (ref 39.0–52.0)
HEMOGLOBIN: 9.1 g/dL — AB (ref 13.0–17.0)
MCH: 27.6 pg (ref 26.0–34.0)
MCHC: 31.2 g/dL (ref 30.0–36.0)
MCV: 88.5 fL (ref 78.0–100.0)
Platelets: 412 10*3/uL — ABNORMAL HIGH (ref 150–400)
RBC: 3.3 MIL/uL — ABNORMAL LOW (ref 4.22–5.81)
RDW: 18.5 % — AB (ref 11.5–15.5)
WBC: 13.4 10*3/uL — ABNORMAL HIGH (ref 4.0–10.5)

## 2014-03-01 LAB — PROTIME-INR
INR: 1.66 — ABNORMAL HIGH (ref 0.00–1.49)
Prothrombin Time: 19.8 seconds — ABNORMAL HIGH (ref 11.6–15.2)

## 2014-03-01 LAB — HEPARIN LEVEL (UNFRACTIONATED): HEPARIN UNFRACTIONATED: 0.54 [IU]/mL (ref 0.30–0.70)

## 2014-03-01 MED ORDER — LEVALBUTEROL HCL 0.63 MG/3ML IN NEBU: 0.6300 mg | INHALATION_SOLUTION | Freq: Four times a day (QID) | RESPIRATORY_TRACT | Status: DC | PRN

## 2014-03-01 MED ORDER — WARFARIN SODIUM 10 MG PO TABS
10.0000 mg | ORAL_TABLET | Freq: Once | ORAL | Status: AC
Start: 2014-03-01 — End: 2014-03-01
  Administered 2014-03-01: 10 mg via ORAL
  Filled 2014-03-01: qty 1

## 2014-03-01 MED ORDER — IPRATROPIUM BROMIDE 0.02 % IN SOLN: 0.5000 mg | Freq: Four times a day (QID) | RESPIRATORY_TRACT | Status: DC | PRN

## 2014-03-01 NOTE — Progress Notes (Signed)
Inpatient Diabetes Program Recommendations  AACE/ADA: New Consensus Statement on Inpatient Glycemic Control (2013)  Target Ranges:  Prepandial:   less than 140 mg/dL      Peak postprandial:   less than 180 mg/dL (1-2 hours)      Critically ill patients:  140 - 180 mg/dL   Results for Joseph [name], Joseph [name] (MRN [id number]) as of 03/01/2014 09:12  Ref. Range 02/28/2014 06:13 02/28/2014 11:40 02/28/2014 16:56 02/28/2014 21:09 03/01/2014 06:23  Glucose-Capillary Latest Range: 70-99 mg/dL 197 (H) 217 (H) 201 (H) 391 (H) 110 (H)   Reason for Visit: elevated CBG  Diabetes history: Type 2 Outpatient Diabetes medications: Lantus 35 units/day Current orders for Inpatient glycemic control: Lantus 44units at hs, Novolog 0-15 units tid with meals  Noted** Lantus increased to 50 units. Post prandial blood sugars elevated may consider adding Novolog 2-3 units tid with meals (along with current Novolog correction at meals).   Gentry Fitz, RN, BA, MHA, CDE Diabetes Coordinator Inpatient Diabetes Program  [phone] (Team Pager) [phone] [facility]) 03/01/2014 9:18 AM

## 2014-03-01 NOTE — Progress Notes (Signed)
Seems to be holding his own as far as volume status.  I have arranged a follow up for him at [place] to be evaluated by the nurses on Tuesday 11/17 at 9:30 AM to make sure he will be OK to wait to start dialysis around November 23 or 24th  Dewey Viens A

## 2014-03-01 NOTE — Progress Notes (Signed)
[facility] PROGRESS NOTE  Joseph [name] WSF:[id number] DOB: 06/03/1955 DOA: 02/20/2014 PCP: Patricia Nettle, MD  Assessment/Plan: 1. Acutely decompensated diastolic CHF 1. CHF team following 2. On PO lasix 2. Acute on chronic hypoxic respiratory failure 1. Pt with 2LNC baseline O2 2. Cont wean O2 as tolerated 3. CKD 4-5 1. Nephrology was consulted and had been following 4. Questionable RUL infiltrate 1. Monitor for now as no focal process was identified on f/u cxr 5. Anemia of chronic diseases 1. On aranesp and cont on iron replacement 6. Pulm HTN 1. O2 dependent 7. Chronic Afib 1. Rate controlled 2. Pt transitioned to coumadin 3. Pt not willing to take lovenox, thus is on heparin gtt until INR therapeutic 8. DM 1. Glucose stable, albeit suboptimal 2. Gradually titrate lantus as tolerated 3. On SSI 9. HLD 1. On statin 10. HTN 1. bp stable 2. Cont monitor 11. Gout 1. Given steroid taper with colchicine 12. DVT prophylaxis 1. Therapeutic anticoagulation per above  Code Status: Full Family Communication: Pt in room Disposition Plan: Pending   Consultants:  Nephrology  Cardiology  Procedures:    Antibiotics:    HPI/Subjective: No acute events noted overnight  Objective: Filed Vitals:   03/01/14 0506 03/01/14 0742 03/01/14 1023 03/01/14 1457  BP:   127/82 124/74  Pulse:   87 87  Temp:    98 F (36.7 C)  TempSrc:    Oral  Resp:    18  Height:      Weight: 84.46 kg (186 lb 3.2 oz)     SpO2:  99%  100%    Intake/Output Summary (Last 24 hours) at 03/01/14 1725 Last data filed at 03/01/14 1633  Gross per 24 hour  Intake    855 ml  Output   2500 ml  Net  -1645 ml   Filed Weights   02/27/14 0551 02/28/14 0631 03/01/14 0506  Weight: 86.955 kg (191 lb 11.2 oz) 87.045 kg (191 lb 14.4 oz) 84.46 kg (186 lb 3.2 oz)    Exam:   General:  Awake, in nad  Cardiovascular: regular, s1, s2  Respiratory: normal resp effort, no  wheezing  Abdomen: soft,nondistended  Musculoskeletal: perfused, no clubbing   Data Reviewed: Basic Metabolic Panel:  Recent Labs Lab 02/23/14 0547 02/24/14 0230 02/25/14 0307 02/26/14 0401 02/27/14 0600 02/28/14 0453  NA 142 138 135* 134* 137 134*  K 3.9 3.8 3.3* 3.6* 3.7 3.4*  CL 101 96 90* 88* 88* 86*  CO2 25 23 28 28 29 31   GLUCOSE 136* 52* 161* 240* 198* 251*  BUN 89* 92* 96* 104* 113* 117*  CREATININE 4.73* 4.75* 4.80* 5.29* 5.73* 5.05*  CALCIUM 8.9 8.8 8.7 8.8 9.1 8.9  MG 2.2  --   --   --   --   --   PHOS  --  5.1* 5.1*  --  6.6* 5.3*   Liver Function Tests:  Recent Labs Lab 02/24/14 0230 02/25/14 0307 02/27/14 0600 02/28/14 0453  ALBUMIN 2.6* 2.6* 2.7* 2.7*   No results for input(s): LIPASE, AMYLASE in the last 168 hours. No results for input(s): AMMONIA in the last 168 hours. CBC:  Recent Labs Lab 02/24/14 0230 02/25/14 0307 02/26/14 0401 03/01/14 0425  WBC 8.3 9.6 12.3* 13.4*  HGB 8.3* 8.3* 7.7* 9.1*  HCT 27.1* 26.9* 24.6* 29.2*  MCV 86.9 86.2 87.2 88.5  PLT 253 301 278 412*   Cardiac Enzymes: No results for input(s): CKTOTAL, CKMB, CKMBINDEX, TROPONINI in the last 168 hours.  BNP (last 3 results)  Recent Labs  01/15/14 1221 02/20/14 1515 02/24/14 1145  PROBNP 9297.0* 15468.0* 10410.0*   CBG:  Recent Labs Lab 02/28/14 1656 02/28/14 2109 03/01/14 0623 03/01/14 1059 03/01/14 1558  GLUCAP 201* 391* 110* 136* 188*    Recent Results (from the past 240 hour(s))  MRSA PCR Screening     Status: None   Collection Time: 02/21/14 12:46 AM  Result Value Ref Range Status   MRSA by PCR NEGATIVE NEGATIVE Final    Comment:        The GeneXpert MRSA Assay (FDA approved for NASAL specimens only), is one component of a comprehensive MRSA colonization surveillance program. It is not intended to diagnose MRSA infection nor to guide or monitor treatment for MRSA infections.      Studies: No results found.  Scheduled Meds: .  antiseptic oral rinse  7 mL Mouth Rinse q12n4p  . atorvastatin  40 mg Oral Daily  . calcitRIOL  0.25 mcg Oral Q M,W,F  . calcium acetate  667 mg Oral TID WC  . chlorhexidine  15 mL Mouth Rinse BID  . colchicine  0.6 mg Oral BID  . darbepoetin (ARANESP) injection - NON-DIALYSIS  100 mcg Subcutaneous Q Wed-1800  . diltiazem  120 mg Oral Daily  . docusate sodium  100 mg Oral BID  . furosemide  160 mg Oral TID  . hydrALAZINE  50 mg Oral TID  . insulin aspart  0-15 Units Subcutaneous TID WC  . insulin glargine  50 Units Subcutaneous QHS  . levalbuterol  0.63 mg Nebulization BID  . metolazone  5 mg Oral BID  . metoprolol  100 mg Oral BID  . multivitamin with minerals   Oral Daily  . potassium chloride  40 mEq Oral BID  . [START ON 03/02/2014] predniSONE  5 mg Oral Q breakfast  . senna  1 tablet Oral BID  . sodium chloride  3 mL Intravenous Q12H  . warfarin  10 mg Oral ONCE-1800  . warfarin   Does not apply Once  . Warfarin - Pharmacist Dosing Inpatient   Does not apply q1800   Continuous Infusions: . heparin 1,450 Units/hr (03/01/14 1023)    Principal Problem:   Acute on chronic diastolic congestive heart failure Active Problems:   Atrial fibrillation-permanent   Diabetes mellitus with nephropathy   Essential hypertension   Hyperlipidemia   Obstructive sleep apnea   Chronic kidney disease (CKD), stage IV (severe)   Acute respiratory failure   Acute decompensated heart failure   Pulmonary hypertension   Acute on chronic respiratory failure with hypoxia   Chronic kidney disease, stage III (moderate)   Diabetes mellitus, controlled   HLD (hyperlipidemia)   CKD (chronic kidney disease)  Time spent: 34min  [facility], [facility] Pager [phone]. If 7PM-7AM, please contact night-coverage at [email or web address], password [facility] 03/01/2014, 5:25 PM  LOS: 9 days

## 2014-03-01 NOTE — Progress Notes (Signed)
ANTICOAGULATION CONSULT NOTE - Follow Up Consult  Pharmacy Consult for Heparin and Coumadin Indication: atrial fibrillation  No Known Allergies  Patient Measurements: Height: 6' (182.9 cm) Weight: 186 lb 3.2 oz (84.46 kg) (scale B) IBW/kg (Calculated) : 77.6 Heparin Dosing Weight: 86kg  Vital Signs: Temp: 98 F (36.7 C) (11/12 0415) Temp Source: Oral (11/12 0415) BP: 127/82 mmHg (11/12 1023) Pulse Rate: 87 (11/12 1023)  Labs:  Recent Labs  02/27/14 0600  02/27/14 2305 02/28/14 0453 03/01/14 0425  HGB  --   --   --   --  9.1*  HCT  --   --   --   --  29.2*  PLT  --   --   --   --  412*  APTT 60*  < > 80* 80* 110*  LABPROT 16.2*  --   --  17.6* 19.8*  INR 1.29  --   --  1.43 1.66*  HEPARINUNFRC 1.44*  --   --  0.75* 0.54  CREATININE 5.73*  --   --  5.05*  --   < > = values in this interval not displayed.  Estimated Creatinine Clearance: 17.5 mL/min (by C-G formula based on Cr of 5.05).   Medications: Heparin @ 1450 units/hr  Assessment: 58yom switched from apixaban to heparin and coumadin as he is approaching dialysis. Baseline heparin level and INR were elevated as expected with apixaban on board.   This morning's aPTT is slightly above goal at 110, but heparin level is therapeutic at 0.54. Will use just heparin levels from now on. INR remains below goal but trending up appropriately with 10mg  doses. Hgb improved. No bleeding reported.  Goal of Therapy: INR 2-3  APTT 66-102 seconds Heparin level 0.3-0.7 units/ml Monitor platelets by anticoagulation protocol: Yes  Plan:  1) Continue heparin at 1450 units/hr 2) Repeat coumadin 10mg  x 1 4) Follow up daily heparin level, and INR  Deboraha Sprang 03/01/2014,2:44 PM

## 2014-03-01 NOTE — Progress Notes (Signed)
Report given to receiving RN. Patient in bed sleeping. No signs of distress noted.

## 2014-03-01 NOTE — Progress Notes (Signed)
UR completed Tuyen Uncapher K. Caitlin Hillmer, RN, BSN, [place], CCM  03/01/2014 2:35 PM

## 2014-03-01 NOTE — Progress Notes (Signed)
PT Cancellation Note  Patient Details Name: Joseph [name] MRN: [id number] DOB: Aug 13, 1955   Cancelled Treatment:    Reason Eval/Treat Not Completed: Medical issues which prohibited therapy (gout). Will see tomorrow AM.   Suanne Marker 03/01/2014, 3:49 PM  [facility] PT [phone]

## 2014-03-01 NOTE — Progress Notes (Signed)
Pt refuses neb tx-pt states he does not take neb txs at [facility].  BBS clr. Pt in no distress

## 2014-03-02 DIAGNOSIS — E0821 Diabetes mellitus due to underlying condition with diabetic nephropathy: Secondary | ICD-10-CM

## 2014-03-02 LAB — CBC
HEMATOCRIT: 32.9 % — AB (ref 39.0–52.0)
HEMOGLOBIN: 10.2 g/dL — AB (ref 13.0–17.0)
MCH: 27.9 pg (ref 26.0–34.0)
MCHC: 31 g/dL (ref 30.0–36.0)
MCV: 89.9 fL (ref 78.0–100.0)
Platelets: 451 10*3/uL — ABNORMAL HIGH (ref 150–400)
RBC: 3.66 MIL/uL — ABNORMAL LOW (ref 4.22–5.81)
RDW: 18.8 % — ABNORMAL HIGH (ref 11.5–15.5)
WBC: 13.2 10*3/uL — ABNORMAL HIGH (ref 4.0–10.5)

## 2014-03-02 LAB — GLUCOSE, CAPILLARY
GLUCOSE-CAPILLARY: 139 mg/dL — AB (ref 70–99)
GLUCOSE-CAPILLARY: 134 mg/dL — AB (ref 70–99)
Glucose-Capillary: 220 mg/dL — ABNORMAL HIGH (ref 70–99)
Glucose-Capillary: 329 mg/dL — ABNORMAL HIGH (ref 70–99)

## 2014-03-02 LAB — HEPARIN LEVEL (UNFRACTIONATED): HEPARIN UNFRACTIONATED: 0.45 [IU]/mL (ref 0.30–0.70)

## 2014-03-02 LAB — PROTIME-INR
INR: 1.91 — ABNORMAL HIGH (ref 0.00–1.49)
PROTHROMBIN TIME: 22.1 s — AB (ref 11.6–15.2)

## 2014-03-02 MED ORDER — WARFARIN SODIUM 10 MG PO TABS
10.0000 mg | ORAL_TABLET | Freq: Once | ORAL | Status: AC
  Filled 2014-03-02: qty 1

## 2014-03-02 MED ORDER — METOLAZONE 2.5 MG PO TABS
2.5000 mg | ORAL_TABLET | Freq: Every day | ORAL | Status: DC
  Administered 2014-03-03: 2.5 mg via ORAL
  Filled 2014-03-02: qty 1

## 2014-03-02 MED ORDER — POLYETHYLENE GLYCOL 3350 17 G PO PACK
17.0000 g | PACK | Freq: Every day | ORAL | Status: DC
  Administered 2014-03-03: 17 g via ORAL
  Filled 2014-03-02: qty 1

## 2014-03-02 MED ORDER — METHYLPREDNISOLONE SODIUM SUCC 125 MG IJ SOLR
60.0000 mg | INTRAMUSCULAR | Status: AC
  Administered 2014-03-02: 60 mg via INTRAVENOUS
  Filled 2014-03-02: qty 0.96

## 2014-03-02 MED ORDER — WARFARIN SODIUM 10 MG PO TABS
10.0000 mg | ORAL_TABLET | Freq: Once | ORAL | Status: AC
  Administered 2014-03-02: 10 mg via ORAL
  Filled 2014-03-02: qty 1

## 2014-03-02 MED ORDER — HYDROCODONE-ACETAMINOPHEN 7.5-325 MG PO TABS: 1.0000 | ORAL_TABLET | ORAL | Status: DC | PRN

## 2014-03-02 MED ORDER — LEVALBUTEROL HCL 0.63 MG/3ML IN NEBU: 0.6300 mg | INHALATION_SOLUTION | Freq: Four times a day (QID) | RESPIRATORY_TRACT | Status: DC | PRN

## 2014-03-02 NOTE — Progress Notes (Signed)
ANTICOAGULATION CONSULT NOTE - Follow Up Consult  Pharmacy Consult for Heparin and Coumadin Indication: atrial fibrillation  No Known Allergies  Patient Measurements: Height: 6' (182.9 cm) Weight: 185 lb 14.4 oz (84.324 kg) IBW/kg (Calculated) : 77.6 Heparin Dosing Weight: 86kg  Vital Signs: Temp: 98.4 F (36.9 C) (11/13 0444) Temp Source: Oral (11/13 0444) BP: 117/74 mmHg (11/13 0444) Pulse Rate: 105 (11/13 0444)  Labs:  Recent Labs  02/27/14 2305 02/28/14 0453 03/01/14 0425 03/02/14 0348  HGB  --   --  9.1* 10.2*  HCT  --   --  29.2* 32.9*  PLT  --   --  412* 451*  APTT 80* 80* 110*  --   LABPROT  --  17.6* 19.8* 22.1*  INR  --  1.43 1.66* 1.91*  HEPARINUNFRC  --  0.75* 0.54 0.45  CREATININE  --  5.05*  --   --     Estimated Creatinine Clearance: 17.5 mL/min (by C-G formula based on Cr of 5.05).   Medications: Heparin @ 1450 units/hr  Assessment: 58yom switched from apixaban to heparin and coumadin as he is approaching dialysis. Baseline heparin level and INR were elevated as expected with apixaban on board.   This morning's heparin level is therapeutic at 0.45. INR remains below goal but trending up appropriately with 10mg  doses. Hgb improved. No bleeding reported.  Goal of Therapy: INR 2-3  Heparin level 0.3-0.7 units/ml Monitor platelets by anticoagulation protocol: Yes  Plan:  1) Continue heparin at 1450 units/hr 2) Repeat coumadin 10mg  x 1 4) Follow up daily heparin level, and INR  Erin Hearing PharmD., BCPS Clinical Pharmacist Pager [phone] 03/02/2014 8:47 AM

## 2014-03-02 NOTE — Progress Notes (Signed)
SATURATION QUALIFICATIONS: (This note is used to comply with regulatory documentation for home oxygen)  Patient Saturations on Room Air at Rest = 94%  Patient Saturations on Room Air while Ambulating = 86%  Patient Saturations on 2 Liters of oxygen while Ambulating = >90%%  Please briefly explain why patient needs home oxygen: Patient currently on home O2, pt reports 2-4 liters. Patient desaturated when ambulating on room air.    Joseph [name], [place] DPT  [phone]

## 2014-03-02 NOTE — Evaluation (Signed)
Physical Therapy Evaluation Patient Details Name: Joseph [name] MRN: [id number] DOB: 1956-02-06 Today's Date: 03/02/2014   History of Present Illness  Patient is a 58 yo male with complex medical history presents with Acute decompensated diastolic congestive heart failure.  Clinical Impression  Patient ambulated without assist >200 ft.  O2 saturations assessment performed. O2 on 4 liters at rest 99%, decreased to 2 liters and evaluated after 2 mins without activity 96%, decreased to Room air and evaluated after 2 mins without activity 94%.  Ambulated on room air 50' 92%, 100' 90%, 200' 86%. Educated on pursed lip breathing and mobility expectations. No further acute PT needs, will sign off. Encouraged ambulation with nsg.     Follow Up Recommendations Supervision/Assistance - 24 hour    Equipment Recommendations  None recommended by PT    Recommendations for Other Services       Precautions / Restrictions Precautions Precautions:  (oxygen at home) Restrictions Weight Bearing Restrictions: No      Mobility  Bed Mobility Overal bed mobility: Modified Independent             General bed mobility comments: increased time to perform  Transfers Overall transfer level: Modified independent                  Ambulation/Gait Ambulation/Gait assistance: Modified independent (Device/Increase time) Ambulation Distance (Feet): 240 Feet Assistive device:  (pushing IV pole) Gait Pattern/deviations: Step-through pattern;Antalgic;Narrow base of support Gait velocity: modestly decreased Gait velocity interpretation: Below normal speed for age/gender General Gait Details: steady despite overall antalgic gait and RLE knee pain  Stairs            Wheelchair Mobility    Modified Rankin (Stroke Patients Only)       Balance Overall balance assessment: Modified Independent                                           Pertinent Vitals/Pain Pain  Assessment: 0-10 Pain Score: 8  Pain Location: right knee (gout pain) Pain Descriptors / Indicators: Constant;Aching;Discomfort Pain Intervention(s): Monitored during session;Repositioned    Home Living Family/patient expects to be discharged to:: Private residence Living Arrangements: Spouse/significant other Available Help at Discharge: Family Type of Home: House Home Access: Level entry     Home Layout: One level Home Equipment: Cane - single point      Prior Function Level of Independence: Independent         Comments: used 2 liters home O2     Hand Dominance   Dominant Hand: Right    Extremity/Trunk Assessment   Upper Extremity Assessment: Overall WFL for tasks assessed (Right elbow pain from gout)           Lower Extremity Assessment: RLE deficits/detail;Overall WFL for tasks assessed         Communication   Communication: No difficulties  Cognition Arousal/Alertness: Awake/alert Behavior During Therapy: WFL for tasks assessed/performed Overall Cognitive Status: Within Functional Limits for tasks assessed                      General Comments General comments (skin integrity, edema, etc.): performed O2 saturation assessment during mobility, Patient education on pursed lip breathing as well as mobility expectations and safety.    Exercises        Assessment/Plan    PT Assessment Patent does not need any further  PT services  PT Diagnosis Difficulty walking;Abnormality of gait;Acute pain   PT Problem List    PT Treatment Interventions     PT Goals (Current goals can be found in the Care Plan section) Acute Rehab PT Goals PT Goal Formulation: All assessment and education complete, DC therapy    Frequency     Barriers to discharge        Co-evaluation               End of Session Equipment Utilized During Treatment: Gait belt;Oxygen Activity Tolerance: Patient tolerated treatment well;Patient limited by pain Patient left:  in chair;with call bell/phone within reach;with family/visitor present Nurse Communication: Mobility status         Time: 0175-1025 PT Time Calculation (min) (ACUTE ONLY): 26 min   Charges:   PT Evaluation $Initial PT Evaluation Tier I: 1 Procedure PT Treatments $Gait Training: 8-22 mins $Self Care/Home Management: 8-22   PT G CodesDuncan Dull 03/02/2014, 9:37 AM Alben Deeds, PT DPT  [phone]

## 2014-03-02 NOTE — Plan of Care (Signed)
Problem: Phase III Progression Outcomes Goal: Pain controlled on oral analgesia Outcome: Completed/Met Date Met:  03/02/14 Goal: Activity at appropriate level-compared to baseline (UP IN CHAIR FOR HEMODIALYSIS)  Outcome: Completed/Met Date Met:  03/02/14 Goal: Discharge plan remains appropriate-arrangements made Outcome: Completed/Met Date Met:  03/02/14

## 2014-03-02 NOTE — Progress Notes (Signed)
[facility] PROGRESS NOTE  Joseph [name] FAO:[id number] DOB: 1955/06/15 DOA: 02/20/2014 PCP: Patricia Nettle, MD  Assessment/Plan: 1. Acutely decompensated diastolic CHF 1. CHF team following 2. Currently on PO lasix 2. Acute on chronic hypoxic respiratory failure 1. Pt with 2LNC baseline O2 2. Cont wean O2 as tolerated 3. CKD 4-5 1. Nephrology was consulted and had been following 4. Questionable RUL infiltrate 1. Cont to monitor for now as no focal process was identified on f/u cxr 5. Anemia of chronic diseases 1. On aranesp and cont on iron replacement 6. Pulm HTN 1. O2 dependent 7. Chronic Afib 1. Rate controlled 2. Pt transitioned to coumadin 3. Pt not willing to take lovenox, thus is on heparin gtt until INR therapeutic 4. INR 1.9 today 8. DM 1. Glucose stable, albeit suboptimal 2. Gradually titrate lantus as tolerated 3. On SSI 9. HLD 1. On statin 10. HTN 1. bp stable 2. Cont monitor 11. Gout 1. Given steroid taper with colchicine 12. DVT prophylaxis 1. Therapeutic anticoagulation per above  Code Status: Full Family Communication: Pt in room Disposition Plan: Pending   Consultants:  Nephrology  Cardiology  Procedures:    Antibiotics:    HPI/Subjective: No acute events noted overnight. Eager to go home  Objective: Filed Vitals:   03/02/14 0444 03/02/14 0923 03/02/14 1023 03/02/14 1426  BP: 117/74  102/69 115/67  Pulse: 105  98 81  Temp: 98.4 F (36.9 C)   98.4 F (36.9 C)  TempSrc: Oral   Oral  Resp: 18   20  Height:      Weight: 84.324 kg (185 lb 14.4 oz)     SpO2: 100% 98%  100%    Intake/Output Summary (Last 24 hours) at 03/02/14 1649 Last data filed at 03/02/14 1345  Gross per 24 hour  Intake 2010.74 ml  Output   2125 ml  Net -114.26 ml   Filed Weights   02/28/14 0631 03/01/14 0506 03/02/14 0444  Weight: 87.045 kg (191 lb 14.4 oz) 84.46 kg (186 lb 3.2 oz) 84.324 kg (185 lb 14.4 oz)    Exam:   General:  Awake, in  nad  Cardiovascular: regular, s1, s2  Respiratory: normal resp effort, no wheezing  Abdomen: soft,nondistended  Musculoskeletal: perfused, no clubbing   Data Reviewed: Basic Metabolic Panel:  Recent Labs Lab 02/24/14 0230 02/25/14 0307 02/26/14 0401 02/27/14 0600 02/28/14 0453  NA 138 135* 134* 137 134*  K 3.8 3.3* 3.6* 3.7 3.4*  CL 96 90* 88* 88* 86*  CO2 23 28 28 29 31   GLUCOSE 52* 161* 240* 198* 251*  BUN 92* 96* 104* 113* 117*  CREATININE 4.75* 4.80* 5.29* 5.73* 5.05*  CALCIUM 8.8 8.7 8.8 9.1 8.9  PHOS 5.1* 5.1*  --  6.6* 5.3*   Liver Function Tests:  Recent Labs Lab 02/24/14 0230 02/25/14 0307 02/27/14 0600 02/28/14 0453  ALBUMIN 2.6* 2.6* 2.7* 2.7*   No results for input(s): LIPASE, AMYLASE in the last 168 hours. No results for input(s): AMMONIA in the last 168 hours. CBC:  Recent Labs Lab 02/24/14 0230 02/25/14 0307 02/26/14 0401 03/01/14 0425 03/02/14 0348  WBC 8.3 9.6 12.3* 13.4* 13.2*  HGB 8.3* 8.3* 7.7* 9.1* 10.2*  HCT 27.1* 26.9* 24.6* 29.2* 32.9*  MCV 86.9 86.2 87.2 88.5 89.9  PLT 253 301 278 412* 451*   Cardiac Enzymes: No results for input(s): CKTOTAL, CKMB, CKMBINDEX, TROPONINI in the last 168 hours. BNP (last 3 results)  Recent Labs  01/15/14 1221 02/20/14 1515 02/24/14 1145  PROBNP 9297.0* 15468.0* 10410.0*   CBG:  Recent Labs Lab 03/01/14 1558 03/01/14 2118 03/02/14 0710 03/02/14 1108 03/02/14 1607  GLUCAP 188* 282* 139* 134* 220*    Recent Results (from the past 240 hour(s))  MRSA PCR Screening     Status: None   Collection Time: 02/21/14 12:46 AM  Result Value Ref Range Status   MRSA by PCR NEGATIVE NEGATIVE Final    Comment:        The GeneXpert MRSA Assay (FDA approved for NASAL specimens only), is one component of a comprehensive MRSA colonization surveillance program. It is not intended to diagnose MRSA infection nor to guide or monitor treatment for MRSA infections.      Studies: No results  found.  Scheduled Meds: . antiseptic oral rinse  7 mL Mouth Rinse q12n4p  . atorvastatin  40 mg Oral Daily  . calcitRIOL  0.25 mcg Oral Q M,W,F  . calcium acetate  667 mg Oral TID WC  . chlorhexidine  15 mL Mouth Rinse BID  . colchicine  0.6 mg Oral BID  . darbepoetin (ARANESP) injection - NON-DIALYSIS  100 mcg Subcutaneous Q Wed-1800  . diltiazem  120 mg Oral Daily  . docusate sodium  100 mg Oral BID  . furosemide  160 mg Oral TID  . hydrALAZINE  50 mg Oral TID  . insulin aspart  0-15 Units Subcutaneous TID WC  . insulin glargine  50 Units Subcutaneous QHS  . levalbuterol  0.63 mg Nebulization BID  . [START ON 03/03/2014] metolazone  2.5 mg Oral Daily  . metoprolol  100 mg Oral BID  . multivitamin with minerals   Oral Daily  . [START ON 03/03/2014] polyethylene glycol  17 g Oral Daily  . potassium chloride  40 mEq Oral BID  . senna  1 tablet Oral BID  . sodium chloride  3 mL Intravenous Q12H  . [COMPLETED] warfarin  10 mg Oral ONCE-1800  . warfarin  10 mg Oral ONCE-1800  . warfarin   Does not apply Once  . Warfarin - Pharmacist Dosing Inpatient   Does not apply q1800   Continuous Infusions: . heparin 1,450 Units/hr (03/02/14 0130)    Principal Problem:   Acute on chronic diastolic congestive heart failure Active Problems:   Atrial fibrillation-permanent   Diabetes mellitus with nephropathy   Essential hypertension   Hyperlipidemia   Obstructive sleep apnea   Chronic kidney disease (CKD), stage IV (severe)   Acute respiratory failure   Acute decompensated heart failure   Pulmonary hypertension   Acute on chronic respiratory failure with hypoxia   Chronic kidney disease, stage III (moderate)    Diabetes mellitus, controlled   HLD (hyperlipidemia)   CKD (chronic kidney disease)  Time spent: 31min  CHIU, [facility] Pager [phone]. If 7PM-7AM, please contact night-coverage at [email or web address], password [facility] 03/02/2014, 4:49 PM  LOS: 10 days

## 2014-03-02 NOTE — Progress Notes (Signed)
I came to see patient today as there was some confusion regarding the plan.  He is diuresing well (maybe too well ) on current regimen.  I am going to decrease metolazone to 2.5 daily but keep lasix the same.  I have [name] follow up with him at [facility] on November 17 and then he is slated to start HD at the [facility] on November 23rd.  I believe family is now on board with this plan.  Patient is uremic so is forgetting details.  That should get better witih initiation of HD  Joseph [name]

## 2014-03-02 NOTE — Progress Notes (Signed)
Report given to receiving RN. Patient in bed resting watching TV, with family at bedside. No signs of distress or discomfort noted. 

## 2014-03-03 LAB — GLUCOSE, CAPILLARY
GLUCOSE-CAPILLARY: 169 mg/dL — AB (ref 70–99)
Glucose-Capillary: 161 mg/dL — ABNORMAL HIGH (ref 70–99)

## 2014-03-03 LAB — CBC
HCT: 32.7 % — ABNORMAL LOW (ref 39.0–52.0)
HEMOGLOBIN: 10 g/dL — AB (ref 13.0–17.0)
MCH: 28 pg (ref 26.0–34.0)
MCHC: 30.6 g/dL (ref 30.0–36.0)
MCV: 91.6 fL (ref 78.0–100.0)
Platelets: 419 10*3/uL — ABNORMAL HIGH (ref 150–400)
RBC: 3.57 MIL/uL — AB (ref 4.22–5.81)
RDW: 18.9 % — ABNORMAL HIGH (ref 11.5–15.5)
WBC: 16.4 10*3/uL — ABNORMAL HIGH (ref 4.0–10.5)

## 2014-03-03 LAB — RENAL FUNCTION PANEL
Albumin: 2.9 g/dL — ABNORMAL LOW (ref 3.5–5.2)
Anion gap: 16 — ABNORMAL HIGH (ref 5–15)
BUN: 128 mg/dL — AB (ref 6–23)
CALCIUM: 9.9 mg/dL (ref 8.4–10.5)
CO2: 30 meq/L (ref 19–32)
Chloride: 91 mEq/L — ABNORMAL LOW (ref 96–112)
Creatinine, Ser: 5.61 mg/dL — ABNORMAL HIGH (ref 0.50–1.35)
GFR calc Af Amer: 12 mL/min — ABNORMAL LOW (ref >90)
GFR calc non Af Amer: 10 mL/min — ABNORMAL LOW (ref >90)
GLUCOSE: 214 mg/dL — AB (ref 70–99)
PHOSPHORUS: 5.1 mg/dL — AB (ref 2.3–4.6)
Potassium: 5.7 mEq/L — ABNORMAL HIGH (ref 3.7–5.3)
Sodium: 137 mEq/L (ref 137–147)

## 2014-03-03 LAB — HEPARIN LEVEL (UNFRACTIONATED): Heparin Unfractionated: 0.52 IU/mL (ref 0.30–0.70)

## 2014-03-03 LAB — PROTIME-INR
INR: 2.66 — ABNORMAL HIGH (ref 0.00–1.49)
Prothrombin Time: 28.6 seconds — ABNORMAL HIGH (ref 11.6–15.2)

## 2014-03-03 MED ORDER — DILTIAZEM HCL ER COATED BEADS 120 MG PO CP24: 120.0000 mg | ORAL_CAPSULE | Freq: Every day | ORAL | Status: DC

## 2014-03-03 MED ORDER — HYDRALAZINE HCL 50 MG PO TABS: 50.0000 mg | ORAL_TABLET | Freq: Three times a day (TID) | ORAL | Status: DC

## 2014-03-03 MED ORDER — WARFARIN SODIUM 5 MG PO TABS: 5.0000 mg | ORAL_TABLET | Freq: Every day | ORAL | Status: DC

## 2014-03-03 MED ORDER — METOLAZONE 2.5 MG PO TABS: 2.5000 mg | ORAL_TABLET | Freq: Every day | ORAL | Status: DC

## 2014-03-03 MED ORDER — FUROSEMIDE 80 MG PO TABS: 160.0000 mg | ORAL_TABLET | Freq: Three times a day (TID) | ORAL | Status: DC

## 2014-03-03 NOTE — Plan of Care (Signed)
Problem: Discharge Progression Outcomes Goal: Activity appropriate for discharge plan Outcome: Completed/Met Date Met:  03/03/14     

## 2014-03-03 NOTE — Progress Notes (Signed)
Informed earlier this am by secretary Virgilio Belling that Dr Haroldine Laws called and instructed to d/c pt after he is seen by Renal doctor today.  Call hemodialysis and spoke with Georgann Housekeeper, nurse asked if Dr. Moshe Cipro  Had seen been to see pt today.  Georgann Housekeeper verified with MD &  instructed that she had done so and pt can be d/c today.  Karie Kirks, Therapist, sports.

## 2014-03-03 NOTE — Progress Notes (Signed)
All d/c instructions explained and given to pt & spouse.  Verbalized understanding.  D/c off floor via w/c to awaiting transportation @ 1441.  Karie Kirks, Therapist, sports.

## 2014-03-03 NOTE — Plan of Care (Signed)
Problem: Discharge Progression Outcomes Goal: Pain controlled with appropriate interventions Outcome: Completed/Met Date Met:  03/03/14 Goal: Hemodynamically stable Outcome: Completed/Met Date Met:  31/49/70 Goal: Complications resolved/controlled Outcome: Completed/Met Date Met:  03/03/14

## 2014-03-03 NOTE — Discharge Summary (Addendum)
Physician Discharge Summary  Joseph [name] YJE:[id number] DOB: 1956-04-08 DOA: 02/20/2014  PCP: Patricia Nettle, MD  Admit date: 02/20/2014 Discharge date: 03/03/2014  Time spent: 35 minutes  Recommendations for Outpatient Follow-up:  1. Follow up with pcp in 1-2 weeks 2. Follow up with Nephrology and initiation of HD as scheduled 3. Repeat BMET within one week  Discharge Diagnoses:  Principal Problem:   Acute on chronic diastolic congestive heart failure Active Problems:   Atrial fibrillation-permanent   Diabetes mellitus with nephropathy   Essential hypertension   Hyperlipidemia   Obstructive sleep apnea   Chronic kidney disease (CKD), stage IV (severe)   Acute respiratory failure   Acute decompensated heart failure   Pulmonary hypertension   Acute on chronic respiratory failure with hypoxia   Chronic kidney disease, stage III (moderate)   Diabetes mellitus, controlled   HLD (hyperlipidemia)   CKD (chronic kidney disease)   Discharge Condition: Stable  Diet recommendation: Heart healthy/renal  Filed Weights   03/01/14 0506 03/02/14 0444 03/03/14 0602  Weight: 84.46 kg (186 lb 3.2 oz) 84.324 kg (185 lb 14.4 oz) 84.687 kg (186 lb 11.2 oz)    History of present illness:  Please see admit h and p from 11/3 for details. Briefly, pt presents with SOB with increase from home 2L to 4L o2 with over 5lb wt gain the week prior to admit. The patient was admitted for further work up.  Hospital Course:  1. Acutely decompensated diastolic CHF 1. CHF team following 2. The patient has noted improvement during this admission with aggressive diuresis 3. Currently on PO lasix with zaroxolyn  2. Acute on chronic hypoxic respiratory failure 1. Pt with 2LNC baseline O2 2. Pt continues to be O2 dependent by day of discharge 3. CKD 4-5 1. Nephrology was consulted and had been following 2. The patient is scheduled for initiation of HD in just over a week from discharge 3. Pt is to follow  up closely with [facility] 4. Questionable RUL infiltrate 1. No focal process was identified on f/u cxr. 2. This was monitored and the patient remained quite stable without abx 5. Anemia of chronic diseases 1. On aranesp and cont on iron replacement per Nephrology 6. Pulm HTN 1. O2 dependent at 2L 7. Chronic Afib 1. Rate controlled 2. Pt transitioned to coumadin per Cardiology 3. Pt not willing to take lovenox, thus was on heparin gtt until INR therapeutic 4. INR 2.6 on day of discharge 8. DM 1. Glucose stable, albeit suboptimal 2. On lantus as tolerated 3. On SSI 9. HLD 1. On statin 10. HTN 1. bp stable 2. Cont monitor 11. Gout 1. Given steroid taper with colchicine 12. DVT prophylaxis 1. Therapeutic anticoagulation per above 13. Hyperkalemia 1. Pt had been on daily K replacement 2. This has been d/c'd on discharge 3. Discussed with Cardiology as well as Nephrology. Pt is to be seen very soon after discharge and BMET will be followed up. Patient is clear to be discharged per Nephrology.  Consultations:  Cardiology  Nephrology  Discharge Exam: Filed Vitals:   03/02/14 2300 03/03/14 0602 03/03/14 0947 03/03/14 0948  BP: 123/73 110/68 121/62   Pulse: 106 70  76  Temp:  98.1 F (36.7 C)    TempSrc:  Oral    Resp:  18    Height:      Weight:  84.687 kg (186 lb 11.2 oz)    SpO2:  98%      General: awake, in nad Cardiovascular:  regular, s1, s2 Respiratory: normal resp effort, no wheezing  Discharge Instructions     Medication List    STOP taking these medications        amLODipine 10 MG tablet  Commonly known as:  NORVASC     APIDRA 100 UNIT/ML injection  Generic drug:  insulin glulisine     ELIQUIS 5 MG Tabs tablet  Generic drug:  apixaban      TAKE these medications        atorvastatin 40 MG tablet  Commonly known as:  LIPITOR  Take 40 mg by mouth daily.     colchicine 0.6 MG tablet  Take 0.6 mg by mouth 2 (two) times daily.      diltiazem 120 MG 24 hr capsule  Commonly known as:  CARDIZEM CD  Take 1 capsule (120 mg total) by mouth daily.     furosemide 80 MG tablet  Commonly known as:  LASIX  Take 2 tablets (160 mg total) by mouth 3 (three) times daily.     hydrALAZINE 50 MG tablet  Commonly known as:  APRESOLINE  Take 1 tablet (50 mg total) by mouth 3 (three) times daily.     HYDROcodone-acetaminophen 7.5-325 MG per tablet  Commonly known as:  NORCO  Take 0.5 tablets by mouth daily as needed for moderate pain.     LANTUS SOLOSTAR 100 UNIT/ML Solostar Pen  Generic drug:  Insulin Glargine  Inject 50 Units into the skin daily. Sliding scale     metolazone 2.5 MG tablet  Commonly known as:  ZAROXOLYN  Take 1 tablet (2.5 mg total) by mouth daily.     metoprolol 100 MG tablet  Commonly known as:  LOPRESSOR  Take 100 mg by mouth 2 (two) times daily.     multivitamin tablet  Take 1 tablet by mouth daily.     OVER THE COUNTER MEDICATION  Take 1 tablet by mouth daily. For joints     warfarin 5 MG tablet  Commonly known as:  COUMADIN  Take 1 tablet (5 mg total) by mouth daily.       Allergies  Allergen Reactions  . Dilaudid [Hydromorphone] Nausea And Vomiting   Follow-up Information    Follow up with [facility].   Why:  Registered Nurse Services to start within 24-48 hours of discharge   Contact information:   [place] [address] [phone]       Follow up with [facility] On 03/06/2014.   Why:  Go to [facility] 11/17 at 9:30 AM for check    Contact information:   [place] [address] [phone]       Follow up with Patricia Nettle, MD. Schedule an appointment as soon as possible for a visit in 1 week.   Specialty:  Cardiology   Contact information:   [place] [address] [phone]        The results of significant diagnostics from this hospitalization (including imaging, microbiology,  ancillary and laboratory) are listed below for reference.    Significant Diagnostic Studies: Dg Chest 2 View  02/24/2014   CLINICAL DATA:  Followup bone right upper lobe to make sure No pneumonia. Dyspnea.  EXAM: CHEST  2 VIEW  COMPARISON:  02/20/2014  FINDINGS: Increasing diffuse interstitial opacity with small bilateral pleural effusion. Mild cardiomegaly. Stable aortic contours. The previously noted focal right upper lobe opacity is now not visible. No pneumothorax.  IMPRESSION: 1.  Pulmonary edema and small pleural effusions. 2. Previously noted focal opacity in the right upper lobe is no longer visible.   Electronically Signed   By: Jorje Guild M.D.   On: 02/24/2014 09:10   Dg Chest 2 View  02/20/2014   CLINICAL DATA:  Shortness of breath and cough for 8 days.  EXAM: CHEST  2 VIEW  COMPARISON:  02/13/2014  FINDINGS: Stable borderline cardiomegaly.  Negative aortic contours and hila.  Diffuse interstitial opacity persists, with Kerley lines. No significant pleural effusion. There is a newly seen focal opacity in the right upper lung which is asymmetric.  IMPRESSION: 1. Persistent pulmonary edema. 2. Focal opacity in the right upper lung may reflect asymmetry alveolar edema, but pneumonia cannot be excluded.   Electronically Signed   By: Jorje Guild M.D.   On: 02/20/2014 16:00   Dg Chest 2 View  02/13/2014   CLINICAL DATA:  Pre operative respiratory exam. For dialysis shunt. Stage III chronic kidney disease.  EXAM: CHEST  2 VIEW  COMPARISON:  07/18/2008  FINDINGS: There is bilateral interstitial pulmonary edema with Kerley B-lines at both lung bases with tiny bilateral effusions. Heart size and pulmonary vascularity are normal. There is focal alveolar edema in the right upper lobe.  No significant osseous abnormality.  IMPRESSION: Pulmonary edema with tiny effusions.   Electronically Signed   By: Rozetta Nunnery M.D.   On: 02/13/2014 07:13    Microbiology: No results found for this or any  previous visit (from the past 240 hour(s)).   Labs: Basic Metabolic Panel:  Recent Labs Lab 02/25/14 0307 02/26/14 0401 02/27/14 0600 02/28/14 0453 03/03/14 0502  NA 135* 134* 137 134* 137  K 3.3* 3.6* 3.7 3.4* 5.7*  CL 90* 88* 88* 86* 91*  CO2 28 28 29 31 30   GLUCOSE 161* 240* 198* 251* 214*  BUN 96* 104* 113* 117* 128*  CREATININE 4.80* 5.29* 5.73* 5.05* 5.61*  CALCIUM 8.7 8.8 9.1 8.9 9.9  PHOS 5.1*  --  6.6* 5.3* 5.1*   Liver Function Tests:  Recent Labs Lab 02/25/14 0307 02/27/14 0600 02/28/14 0453 03/03/14 0502  ALBUMIN 2.6* 2.7* 2.7* 2.9*   No results for input(s): LIPASE, AMYLASE in the last 168 hours. No results for input(s): AMMONIA in the last 168 hours. CBC:  Recent Labs Lab 02/25/14 0307 02/26/14 0401 03/01/14 0425 03/02/14 0348 03/03/14 0502  WBC 9.6 12.3* 13.4* 13.2* 16.4*  HGB 8.3* 7.7* 9.1* 10.2* 10.0*  HCT 26.9* 24.6* 29.2* 32.9* 32.7*  MCV 86.2 87.2 88.5 89.9 91.6  PLT 301 278 412* 451* 419*   Cardiac Enzymes: No results for input(s): CKTOTAL, CKMB, CKMBINDEX, TROPONINI in the last 168 hours. BNP: BNP (last 3 results)  Recent Labs  01/15/14 1221 02/20/14 1515 02/24/14 1145  PROBNP 9297.0* 15468.0* 10410.0*   CBG:  Recent Labs Lab 03/02/14 0710 03/02/14 1108 03/02/14 1607 03/02/14 2114 03/03/14 0548  GLUCAP 139* 134* 220* 329* 169*    Signed:  CHIU, STEPHEN K  [facility] 03/03/2014, 10:09 AM

## 2014-03-06 ENCOUNTER — Telehealth (HOSPITAL_COMMUNITY): Payer: Self-pay | Admitting: Vascular Surgery

## 2014-03-06 NOTE — Telephone Encounter (Signed)
Nurse from [facility] called she wants to know when Dr. Aundra Dubin want pt INR checked.. Please advise

## 2014-03-07 ENCOUNTER — Telehealth: Payer: Self-pay | Admitting: Cardiology

## 2014-03-07 NOTE — Telephone Encounter (Signed)
New Msg    Olivia Mackie the RN that is seeing patient would like to have a call back in regards to PTN. Please contact at [phone]. States patient has been home since last Saturday and next visit is scheduled for Friday 11/20.

## 2014-03-08 ENCOUNTER — Telehealth: Payer: Self-pay | Admitting: Internal Medicine

## 2014-03-08 NOTE — Telephone Encounter (Signed)
Spoke w/pt's wife she states pt was switched from eliquis to coumadin in the hospital.  He was d/c'd on 11/14 on 5 mg coumadin daily but no follow up and she is concerned, she is agreeable to be set up with coumadin clinic at [address] [facility], pt does currently have [facility] home [facility] and they are schedule to see pt 11/20, order faxed to [facility] at [phone] to get INR 11/20 and call results to [facility]

## 2014-03-08 NOTE — Telephone Encounter (Signed)
New Message        Pt's wife calling stating that pt's d/c instructions state he is to have his INR checked. She is needing to know when he is to start getting it checked and if he will be getting it checked at the CHF or at [facility]. Please call back and advise.

## 2014-03-08 NOTE — Telephone Encounter (Signed)
There is no phone number listed to return Tracy's call, have spoken w/pt's wife see phone note 11/19

## 2014-03-08 NOTE — Telephone Encounter (Signed)
Left message to call back  

## 2014-03-10 ENCOUNTER — Telehealth: Payer: Self-pay | Admitting: Physician Assistant

## 2014-03-10 NOTE — Telephone Encounter (Signed)
Home health RN called to manage his Coumadin. His INR today was 1.7. He is currently taking 5 mg daily.  Requested he take an extra half tablet today, totaling 7.5 mg, recheck Tuesday at dialysis.

## 2014-03-12 ENCOUNTER — Encounter (HOSPITAL_BASED_OUTPATIENT_CLINIC_OR_DEPARTMENT_OTHER): Payer: 59

## 2014-03-12 ENCOUNTER — Telehealth (HOSPITAL_COMMUNITY): Payer: Self-pay | Admitting: Vascular Surgery

## 2014-03-12 NOTE — Telephone Encounter (Signed)
Spoke with wife and gave instructions to give pt 7.5mg s today and tomorrow and per Dr Hedy Camara, [facility] Go will recheck him on Wednesday.

## 2014-03-12 NOTE — Telephone Encounter (Signed)
Spoke w/pt's wife, she states pt took 7.5 mg Sat and Sun as directed by PA and wants to know if he should go back to 5 mg today, advised would send mess to [facility] for them to address

## 2014-03-12 NOTE — Telephone Encounter (Signed)
Pt wife called pt INR levels are really low, medication was changed.Joseph [name] She wants to know what DR. Bensimhon wants pt to do.. Please advie

## 2014-03-14 ENCOUNTER — Ambulatory Visit (INDEPENDENT_AMBULATORY_CARE_PROVIDER_SITE_OTHER): Payer: 59 | Admitting: Pharmacist

## 2014-03-14 DIAGNOSIS — I482 Chronic atrial fibrillation, unspecified: Secondary | ICD-10-CM

## 2014-03-14 LAB — POCT INR: INR: 3.4

## 2014-03-20 ENCOUNTER — Ambulatory Visit (INDEPENDENT_AMBULATORY_CARE_PROVIDER_SITE_OTHER): Payer: 59 | Admitting: *Deleted

## 2014-03-20 DIAGNOSIS — I482 Chronic atrial fibrillation, unspecified: Secondary | ICD-10-CM

## 2014-03-20 LAB — POCT INR: INR: 3.7

## 2014-03-27 ENCOUNTER — Ambulatory Visit (INDEPENDENT_AMBULATORY_CARE_PROVIDER_SITE_OTHER): Payer: 59 | Admitting: Cardiovascular Disease

## 2014-03-27 ENCOUNTER — Encounter (HOSPITAL_COMMUNITY): Payer: Self-pay

## 2014-03-27 ENCOUNTER — Ambulatory Visit (HOSPITAL_COMMUNITY)
Admit: 2014-03-27 | Discharge: 2014-03-27 | Disposition: A | Discharge status: Home / Self Care | Payer: 59 | Source: Ambulatory Visit | Attending: Internal Medicine | Admitting: Internal Medicine

## 2014-03-27 VITALS — BP 100/60 | HR 98 | Wt 184.0 lb

## 2014-03-27 DIAGNOSIS — E119 Type 2 diabetes mellitus without complications: Secondary | ICD-10-CM | POA: Diagnosis not present

## 2014-03-27 DIAGNOSIS — N186 End stage renal disease: Secondary | ICD-10-CM | POA: Diagnosis not present

## 2014-03-27 DIAGNOSIS — Z87891 Personal history of nicotine dependence: Secondary | ICD-10-CM | POA: Diagnosis not present

## 2014-03-27 DIAGNOSIS — I270 Primary pulmonary hypertension: Secondary | ICD-10-CM

## 2014-03-27 DIAGNOSIS — I272 Pulmonary hypertension, unspecified: Secondary | ICD-10-CM

## 2014-03-27 DIAGNOSIS — Z79899 Other long term (current) drug therapy: Secondary | ICD-10-CM | POA: Diagnosis not present

## 2014-03-27 DIAGNOSIS — I482 Chronic atrial fibrillation, unspecified: Secondary | ICD-10-CM

## 2014-03-27 DIAGNOSIS — I120 Hypertensive chronic kidney disease with stage 5 chronic kidney disease or end stage renal disease: Secondary | ICD-10-CM | POA: Insufficient documentation

## 2014-03-27 DIAGNOSIS — Z7901 Long term (current) use of anticoagulants: Secondary | ICD-10-CM | POA: Diagnosis not present

## 2014-03-27 DIAGNOSIS — Z79891 Long term (current) use of opiate analgesic: Secondary | ICD-10-CM | POA: Diagnosis not present

## 2014-03-27 DIAGNOSIS — N183 Chronic kidney disease, stage 3 unspecified: Secondary | ICD-10-CM

## 2014-03-27 DIAGNOSIS — J9621 Acute and chronic respiratory failure with hypoxia: Secondary | ICD-10-CM

## 2014-03-27 DIAGNOSIS — I5032 Chronic diastolic (congestive) heart failure: Secondary | ICD-10-CM | POA: Diagnosis present

## 2014-03-27 LAB — CBC
HCT: 35.9 % — ABNORMAL LOW (ref 39.0–52.0)
HEMOGLOBIN: 11.2 g/dL — AB (ref 13.0–17.0)
MCH: 27.3 pg (ref 26.0–34.0)
MCHC: 31.2 g/dL (ref 30.0–36.0)
MCV: 87.3 fL (ref 78.0–100.0)
Platelets: 333 10*3/uL (ref 150–400)
RBC: 4.11 MIL/uL — ABNORMAL LOW (ref 4.22–5.81)
RDW: 15.5 % (ref 11.5–15.5)
WBC: 9.2 10*3/uL (ref 4.0–10.5)

## 2014-03-27 LAB — POCT INR: INR: 2.9

## 2014-03-27 NOTE — Progress Notes (Signed)
Patient ID: Joseph [name], male   DOB: 1955-12-05, 58 y.o.   MRN: [id number] Referring MD: Dr. Caryl Comes Nephrology: Dr Florene Glen   58 yo with history of CKD, chronic atrial fibrillation, and chronic diastolic CHF was referred to CHF clinic for evaluation of CHF and elevated PA pressure on echo.  Patient has had renal failure, probably due to HTN and diabetes.  He follows with Dr. Florene Glen.  He has been in atrial fibrillation since 6/13 when he failed a cardioversion and it was decided to leave him in atrial fibrillation.  He is now on coumadin.  Most recent echo in 4/15 showed EF 68-34% with PA systolic pressure 85 mmHg.   RHC in 6/15 showing elevated right and left heart filling pressures with moderate pulmonary hypertension, primarily pulmonary venous hypertension.    He continued to struggle with renal failure and volume overload.  He was admitted in 11/15 with acute on chronic diastolic CHF and AKI on CKD. He was started on oxygen.  He was started on hemodialysis for volume removal at that time.  He is currently getting HD 3 times a week and weight is down  No exertional dypsnea, orthopnea, PND, or chest pain now.  He wants to stop oxygen.   Labs (8/14): K 4.4, creatinine 3.7 Labs (6/15): K 4.5, creatinine 4.88 Labs (10/30/13) : K 4.1 Creatinine 4.22 Labs (01/15/14): K 3.8 creatinine 5.57  PMH: 1. CKD: Followed by Dr. Florene Glen, likely due to HTN and diabetes. Progression to ESRD in 11/15.  2. Atrial fibrillation: Chronic.  DCCV attempted in 6/13 but reverted to atrial fibrillation.  Rate control since that time.  3. H/o heavy ETOH, now does not drink. 4. Type II diabetes 5. HTN 6. Chronic diastolic CHF with elevated PA pressure: Echo (4/15) with EF 55-60%, mild MR, PA systolic pressure 85 mmHg, RV normal per report. RHC (6/15) with mean RA 12, PA 68/28 mean 44, mean PCWP 23 with prominent v-waves, CI 3.5, PVR 2.8 WU.   SH: On disability, prior smoker but quit, prior heavy ETOH but quit, lives in  [place].  FH: Mother with valve replacement, sister with ESRD.   ROS: All systems reviewed and negative except as per HPI.   Current Outpatient Prescriptions  Medication Sig Dispense Refill  . atorvastatin (LIPITOR) 40 MG tablet Take 40 mg by mouth daily.    Marland Kitchen b complex-vitamin c-folic acid (NEPHRO-VITE) 0.8 MG TABS tablet Take 1 tablet by mouth daily.    . colchicine 0.6 MG tablet Take 0.6 mg by mouth 2 (two) times daily.     Marland Kitchen diltiazem (CARDIZEM CD) 120 MG 24 hr capsule Take 1 capsule (120 mg total) by mouth daily. 30 capsule 0  . HYDROcodone-acetaminophen (NORCO) 7.5-325 MG per tablet Take 0.5 tablets by mouth daily as needed for moderate pain. 20 tablet 0  . LANTUS SOLOSTAR 100 UNIT/ML injection Inject 50 Units into the skin daily. Sliding scale    . metoprolol (LOPRESSOR) 100 MG tablet Take 100 mg by mouth 2 (two) times daily.    Marland Kitchen warfarin (COUMADIN) 5 MG tablet Take 1 tablet (5 mg total) by mouth daily. 30 tablet 0   No current facility-administered medications for this encounter.    BP 100/60 mmHg  Pulse 98  Wt 184 lb (83.462 kg)  SpO2 95% General: NAD Dyspnea at rest Neck: No JVD.  no thyromegaly or thyroid nodule.  Lungs: CTAB Cardiac: Irregular S1S2, no S3S4.  No edema.  No carotid bruit.  Normal pedal pulses.  Abdomen: Soft, nontender, no hepatosplenomegaly, ++distended.  Skin: Intact without lesions or rashes.  Neurologic: Alert and oriented x 3.  Psych: Normal affect. Extremities: No clubbing or cyanosis.  2-3+ edema   Assessment/Plan: 1. Chronic diastolic CHF: This is well-controlled by HD.  He feels much better with HD initiation. He is euvolemic on exam.  We walked him off oxygen today, oxygen saturation was in the 96-98% range.  He can stop using oxygen during the day, would continue it at night for now.  2. Atrial fibrillation: Chronic. He is on metoprolol and ?diltiazem for rate control (not sure of meds).  HR is ok today. He is on warfarin, will get CBC  today.  3. Medication confusion: Patient does not remember what meds he is taking.  He did not bring them with him today.  He will call back later today to review his pill bottles.  I asked him to always bring his meds to appointments.  Horris Speros,MD 03/27/2014

## 2014-03-27 NOTE — Progress Notes (Signed)
I walked with Mr. Joseph [name] for approx. 500 feet without oxygen.  His Oxygen saturations ranged from 96-89% on RA.

## 2014-03-27 NOTE — Patient Instructions (Addendum)
We will set up a follow-up with Joseph [name] at [facility] today F/U 3 months with [facility] Failure clinic

## 2014-03-29 ENCOUNTER — Encounter (HOSPITAL_COMMUNITY): Payer: Self-pay | Admitting: Cardiology

## 2014-04-03 ENCOUNTER — Ambulatory Visit (INDEPENDENT_AMBULATORY_CARE_PROVIDER_SITE_OTHER): Payer: 59 | Admitting: Pharmacist

## 2014-04-03 DIAGNOSIS — I482 Chronic atrial fibrillation, unspecified: Secondary | ICD-10-CM

## 2014-04-03 LAB — POCT INR: INR: 4

## 2014-04-10 ENCOUNTER — Ambulatory Visit (INDEPENDENT_AMBULATORY_CARE_PROVIDER_SITE_OTHER): Payer: 59 | Admitting: Pharmacist Clinician (PhC)/ Clinical Pharmacy Specialist

## 2014-04-10 DIAGNOSIS — I482 Chronic atrial fibrillation, unspecified: Secondary | ICD-10-CM

## 2014-04-10 LAB — POCT INR: INR: 3.3

## 2014-04-17 ENCOUNTER — Ambulatory Visit (INDEPENDENT_AMBULATORY_CARE_PROVIDER_SITE_OTHER): Payer: 59 | Admitting: Pharmacist

## 2014-04-17 DIAGNOSIS — I482 Chronic atrial fibrillation, unspecified: Secondary | ICD-10-CM

## 2014-04-17 LAB — POCT INR: INR: 2.5

## 2014-04-19 ENCOUNTER — Ambulatory Visit (INDEPENDENT_AMBULATORY_CARE_PROVIDER_SITE_OTHER): Payer: 59 | Admitting: Podiatrist

## 2014-04-19 DIAGNOSIS — E119 Type 2 diabetes mellitus without complications: Secondary | ICD-10-CM

## 2014-04-19 DIAGNOSIS — I739 Peripheral vascular disease, unspecified: Secondary | ICD-10-CM

## 2014-04-19 DIAGNOSIS — B351 Tinea unguium: Secondary | ICD-10-CM

## 2014-04-19 DIAGNOSIS — L84 Corns and callosities: Secondary | ICD-10-CM

## 2014-04-19 DIAGNOSIS — M79676 Pain in unspecified toe(s): Secondary | ICD-10-CM

## 2014-04-19 DIAGNOSIS — Q828 Other specified congenital malformations of skin: Secondary | ICD-10-CM

## 2014-04-19 DIAGNOSIS — M216X1 Other acquired deformities of right foot: Secondary | ICD-10-CM

## 2014-04-19 NOTE — Progress Notes (Signed)
Bilateral nails, on dialysis, kidney disase, neuropathy  Chief Complaint  Patient presents with  . Follow-up    check my feet and my nails trimmed      HPI: Patient is 58 y.o. male who presents today for general foot check as well as a toenail and callus trim.  He is on dialysis MWF.  States he has numbness in his feet.    Allergies  Allergen Reactions  . Dilaudid [Hydromorphone] Nausea And Vomiting    Physical Exam  Patient is awake, alert, and oriented x 3.  In no acute distress.  Vascular status is decreased with palpable pedal pulses at 0/4 DP and 1/4 PT bilateral and capillary refill time increased. Neurological sensation is also intact bilaterally via Semmes Weinstein monofilament at 2/5 sites. Light touch, vibratory sensation, Achilles tendon reflex is decreased. Dermatological exam reveals hyperkeratotic callus submetatarsal 5 of the right foot. Patient's toenails 1, 2, 3, 4 are elongated, thickened, discolored, dystrophic and mycotic as well as painful with pressure. No open lesions present.  Musculature intact with dorsiflexion, plantarflexion, inversion, eversion.  Assessment: Pre-ulcerative callus submetatarsal 5 right foot, symptomatic mycotic toenails 1, 2, 3, 4 bilateral feet  Plan: Debridement of the callus is accomplished today without complication. No iatrogenic bleeding is noted. Debridement of the of the nails is accomplished as well today without complication. He'll be seen back for routine care in 3-6 months or as needed.

## 2014-04-19 NOTE — Patient Instructions (Signed)
Diabetes and Foot Care Diabetes may cause you to have problems because of poor blood supply (circulation) to your feet and legs. This may cause the skin on your feet to become thinner, break easier, and heal more slowly. Your skin may become dry, and the skin may peel and crack. You may also have nerve damage in your legs and feet causing decreased feeling in them. You may not notice minor injuries to your feet that could lead to infections or more serious problems. Taking care of your feet is one of the most important things you can do for yourself.  HOME CARE INSTRUCTIONS  Wear shoes at all times, even in the house. Do not go barefoot. Bare feet are easily injured.  Check your feet daily for blisters, cuts, and redness. If you cannot see the bottom of your feet, use a mirror or ask someone for help.  Wash your feet with warm water (do not use hot water) and mild soap. Then pat your feet and the areas between your toes until they are completely dry. Do not soak your feet as this can dry your skin.  Apply a moisturizing lotion or petroleum jelly (that does not contain alcohol and is unscented) to the skin on your feet and to dry, brittle toenails. Do not apply lotion between your toes.  Trim your toenails straight across. Do not dig under them or around the cuticle. File the edges of your nails with an emery board or nail file.  Do not cut corns or calluses or try to remove them with medicine.  Wear clean socks or stockings every day. Make sure they are not too tight. Do not wear knee-high stockings since they may decrease blood flow to your legs.  Wear shoes that fit properly and have enough cushioning. To break in new shoes, wear them for just a few hours a day. This prevents you from injuring your feet. Always look in your shoes before you put them on to be sure there are no objects inside.  Do not cross your legs. This may decrease the blood flow to your feet.  If you find a minor scrape,  cut, or break in the skin on your feet, keep it and the skin around it clean and dry. These areas may be cleansed with mild soap and water. Do not cleanse the area with peroxide, alcohol, or iodine.  When you remove an adhesive bandage, be sure not to damage the skin around it.  If you have a wound, look at it several times a day to make sure it is healing.  Do not use heating pads or hot water bottles. They may burn your skin. If you have lost feeling in your feet or legs, you may not know it is happening until it is too late.  Make sure your health care provider performs a complete foot exam at least annually or more often if you have foot problems. Report any cuts, sores, or bruises to your health care provider immediately. SEEK MEDICAL CARE IF:   You have an injury that is not healing.  You have cuts or breaks in the skin.  You have an ingrown nail.  You notice redness on your legs or feet.  You feel burning or tingling in your legs or feet.  You have pain or cramps in your legs and feet.  Your legs or feet are numb.  Your feet always feel cold. SEEK IMMEDIATE MEDICAL CARE IF:   There is increasing redness,   swelling, or pain in or around a wound.  There is a red line that goes up your leg.  Pus is coming from a wound.  You develop a fever or as directed by your health care provider.  You notice a bad smell coming from an ulcer or wound. Document Released: 04/03/2000 Document Revised: 12/07/2012 Document Reviewed: 09/13/2012 ExitCare Patient Information 2015 [employer]. This information is not intended to replace advice given to you by your health care provider. Make sure you discuss any questions you have with your health care provider.  

## 2014-04-24 ENCOUNTER — Ambulatory Visit (INDEPENDENT_AMBULATORY_CARE_PROVIDER_SITE_OTHER): Payer: Self-pay | Admitting: *Deleted

## 2014-04-24 ENCOUNTER — Telehealth: Payer: Self-pay | Admitting: Internal Medicine

## 2014-04-24 DIAGNOSIS — I482 Chronic atrial fibrillation, unspecified: Secondary | ICD-10-CM

## 2014-04-24 LAB — POCT INR: INR: 2.5

## 2014-04-24 NOTE — Telephone Encounter (Signed)
New Msg        Michelle from [facility] calling, PT/INR completed today INR 2.5 and PT 29.6.    Last one completed on  04/18/15   INR 2.5  And PT was 29.8    Pt on warafin 5mg  everyday except Sunday he is on  7.5mg .    Does anything need to be changed as far as meds?   Pt being discharged and should be contacted at next appt.   Please contact [place] from [facility].

## 2014-04-24 NOTE — Telephone Encounter (Signed)
Returned call to [place], Therapist, sports with [facility] advised no changes in Warfarin dosing, will call pt to dose Coumadin and schedule f/u appt.  See anticoagulation note in Epic.

## 2014-05-03 ENCOUNTER — Ambulatory Visit (INDEPENDENT_AMBULATORY_CARE_PROVIDER_SITE_OTHER): Payer: 59 | Admitting: Pharmacist

## 2014-05-03 DIAGNOSIS — I482 Chronic atrial fibrillation, unspecified: Secondary | ICD-10-CM

## 2014-05-03 LAB — POCT INR: INR: 1.3

## 2014-05-10 ENCOUNTER — Ambulatory Visit (INDEPENDENT_AMBULATORY_CARE_PROVIDER_SITE_OTHER): Payer: 59 | Admitting: Pharmacist

## 2014-05-10 DIAGNOSIS — I482 Chronic atrial fibrillation, unspecified: Secondary | ICD-10-CM

## 2014-05-10 LAB — POCT INR: INR: 1.8

## 2014-05-16 ENCOUNTER — Ambulatory Visit: Payer: 59 | Admitting: Internal Medicine

## 2014-05-17 ENCOUNTER — Encounter: Payer: Self-pay | Admitting: Internal Medicine

## 2014-05-17 ENCOUNTER — Ambulatory Visit (INDEPENDENT_AMBULATORY_CARE_PROVIDER_SITE_OTHER): Payer: 59 | Admitting: *Deleted

## 2014-05-17 ENCOUNTER — Ambulatory Visit (INDEPENDENT_AMBULATORY_CARE_PROVIDER_SITE_OTHER): Payer: 59 | Admitting: Internal Medicine

## 2014-05-17 VITALS — BP 106/72 | HR 95 | Temp 98.5°F | Wt 192.0 lb

## 2014-05-17 DIAGNOSIS — E119 Type 2 diabetes mellitus without complications: Secondary | ICD-10-CM

## 2014-05-17 DIAGNOSIS — N529 Male erectile dysfunction, unspecified: Secondary | ICD-10-CM

## 2014-05-17 DIAGNOSIS — N184 Chronic kidney disease, stage 4 (severe): Secondary | ICD-10-CM

## 2014-05-17 DIAGNOSIS — I482 Chronic atrial fibrillation, unspecified: Secondary | ICD-10-CM

## 2014-05-17 DIAGNOSIS — I5032 Chronic diastolic (congestive) heart failure: Secondary | ICD-10-CM

## 2014-05-17 DIAGNOSIS — E785 Hyperlipidemia, unspecified: Secondary | ICD-10-CM

## 2014-05-17 DIAGNOSIS — M1A30X0 Chronic gout due to renal impairment, unspecified site, without tophus (tophi): Secondary | ICD-10-CM

## 2014-05-17 DIAGNOSIS — I10 Essential (primary) hypertension: Secondary | ICD-10-CM

## 2014-05-17 LAB — POCT INR: INR: 1.9

## 2014-05-17 MED ORDER — COLCHICINE 0.6 MG PO TABS: 0.6000 mg | ORAL_TABLET | Freq: Two times a day (BID) | ORAL | Status: DC

## 2014-05-17 NOTE — Progress Notes (Signed)
Pre visit review using our clinic review tool, if applicable. No additional management support is needed unless otherwise documented below in the visit note. 

## 2014-05-17 NOTE — Progress Notes (Signed)
HPI  Pt presents to the clinic today to establish care and for management of the conditions listed below. He is transferring care from Dr. Montez Morita.  HTN: BP today 106/72. He is not taking any meds specifically for the HTN. He does have a cardiologist that he follows with, Dr. Cathie Olden.  DM2: Last A1c 6.2%. He is taking the Lantus 50 units daily. He does check his sugars but can't remember the range and did not bring his meter with him today. He does see an eye doctor yearly. Flu shot is UTD. He does not take pneumonia shots.  Afib: Rate and rhythm controlled on Diltiazem and Metoprolol. He is on coumadin and his INR are being followed by Dr. Cathie Olden.  CKD: Last creatinine 5.61, GFR 12. He is getting hemodialysis through a left forearm AV graft. He follows with nephrology at this dialysis clinic, Dr. Clover Mealy.  CHF: Compensated. BP controlled. Not on diuretic at this time.  HLD: Total cholesterol 99, HD 27L, LDL 52, Triglycerides 101. Denies myalgias on Lipitor.  Gout: Only 1 flare over the last year. He reports he is taking colchicine daily but that the medication is very expensive 60$ per month. He would like to know if there is a cheaper alternative.  ED: Uses Viagra when needed.  Past Medical History  Diagnosis Date  . Atrial fibrillation     a. 11/18/2011 s/p DCCV - 150J;  b. Anticoagulation w/ Apixaban;  c. 11/2011 back in afib->asymptomatic.  . H/O alcohol abuse     a. drinks heavily on the weekends.  . Hypertension   . Diabetes mellitus   . Heart murmur     a. 09/2011 Echo: EF 55-60%, Triv AI, Mild MR, mildly dil LA.  . Diabetic peripheral neuropathy   . CKD (chronic kidney disease), stage III   . CHF (congestive heart failure)   . Pneumonia     Current Outpatient Prescriptions  Medication Sig Dispense Refill  . atorvastatin (LIPITOR) 40 MG tablet Take 40 mg by mouth daily.    Marland Kitchen b complex-vitamin c-folic acid (NEPHRO-VITE) 0.8 MG TABS tablet Take 1 tablet by mouth daily.     . colchicine 0.6 MG tablet Take 0.6 mg by mouth 2 (two) times daily.     Marland Kitchen diltiazem (CARDIZEM CD) 120 MG 24 hr capsule Take 1 capsule (120 mg total) by mouth daily. 30 capsule 0  . ethyl chloride spray   6  . HYDROcodone-acetaminophen (NORCO) 7.5-325 MG per tablet Take 0.5 tablets by mouth daily as needed for moderate pain. 20 tablet 0  . LANTUS SOLOSTAR 100 UNIT/ML injection Inject 50 Units into the skin daily. Sliding scale    . metoprolol (LOPRESSOR) 100 MG tablet Take 100 mg by mouth 2 (two) times daily.    Glory Rosebush VERIO test strip     . RENVELA 800 MG tablet     . sildenafil (VIAGRA) 100 MG tablet Take 100 mg by mouth daily as needed for erectile dysfunction.    Marland Kitchen warfarin (COUMADIN) 5 MG tablet Take 1 tablet (5 mg total) by mouth daily. 30 tablet 0   No current facility-administered medications for this visit.    Allergies  Allergen Reactions  . Dilaudid [Hydromorphone] Nausea And Vomiting    Family History  Problem Relation Age of Onset  . Heart disease Mother     before age 6  . Diabetes Father   . Diabetes Sister   . Peripheral vascular disease Sister     amputation  History   Social History  . Marital Status: Married    Spouse Name: N/A    Number of Children: N/A  . Years of Education: N/A   Occupational History  . Not on file.   Social History Main Topics  . Smoking status: Former Smoker -- 0.01 packs/day for 5 years    Types: Cigarettes    Quit date: 04/20/1994  . Smokeless tobacco: Never Used     Comment: some in college  . Alcohol Use: No  . Drug Use: No  . Sexual Activity: Not on file   Other Topics Concern  . Not on file   Social History Narrative    ROS:  Constitutional: Denies fever, malaise, fatigue, headache or abrupt weight changes.  HEENT: Denies eye pain, eye redness, ear pain, ringing in the ears, wax buildup, runny nose, nasal congestion, bloody nose, or sore throat. Respiratory: Pt reports occasional shortness of breath.  Denies difficulty breathing, cough or sputum production.   Cardiovascular: Denies chest pain, chest tightness, palpitations or swelling in the hands or feet.  Gastrointestinal: Denies abdominal pain, bloating, constipation, diarrhea or blood in the stool.  GU: Denies frequency, urgency, pain with urination, blood in urine, odor or discharge. Musculoskeletal: Denies decrease in range of motion, difficulty with gait, muscle pain or joint pain and swelling.  Skin: Denies redness, rashes, lesions or ulcercations.  Neurological: Denies dizziness, difficulty with memory, difficulty with speech or problems with balance and coordination.  Psych: Pt denies anxiety, depression, SI/HI.  No other specific complaints in a complete review of systems (except as listed in HPI above).  PE:  BP 106/72 mmHg  Pulse 95  Temp(Src) 98.5 F (36.9 C) (Oral)  Wt 192 lb (87.091 kg)  SpO2 98% Wt Readings from Last 3 Encounters:  05/17/14 192 lb (87.091 kg)  03/27/14 184 lb (83.462 kg)  03/03/14 186 lb 11.2 oz (84.687 kg)    General: Appears his stated age, well developed, well nourished in NAD. Skin: Warm, dry and intact. No rashes, lesions or ulcerations noted. HEENT: Head: normal shape and size; Eyes: sclera white, no icterus, conjunctiva pink, PERRLA and EOMs intact;  Neck: Neck supple, trachea midline. No masses, lumps or thyromegaly present.  Cardiovascular: Normal rate and rhythm. S1,S2 noted.  No murmur, rubs or gallops noted. No JVD or BLE edema. No carotid bruits noted. Left AV fistula with good bruit/thrill. Pulmonary/Chest: Normal effort and positive vesicular breath sounds. No respiratory distress. No wheezes, rales or ronchi noted.  Abdomen: Soft and nontender. Normal bowel sounds, no bruits noted. No distention or masses noted. Liver, spleen and kidneys non palpable. Neurological: Alert and oriented. Sensation intact to BLE. Psychiatric: Mood and affect normal. Behavior is normal. Judgment and  thought content normal.     BMET    Component Value Date/Time   NA 137 03/03/2014 0502   K 5.7* 03/03/2014 0502   CL 91* 03/03/2014 0502   CO2 30 03/03/2014 0502   GLUCOSE 214* 03/03/2014 0502   BUN 128* 03/03/2014 0502   CREATININE 5.61* 03/03/2014 0502   CREATININE 4.88* 10/17/2013 1027   CALCIUM 9.9 03/03/2014 0502   GFRNONAA 10* 03/03/2014 0502   GFRAA 12* 03/03/2014 0502    Lipid Panel     Component Value Date/Time   CHOL 99 02/23/2014 0547   TRIG 101 02/23/2014 0547   HDL 27* 02/23/2014 0547   CHOLHDL 3.7 02/23/2014 0547   VLDL 20 02/23/2014 0547   LDLCALC 52 02/23/2014 0547    CBC  Component Value Date/Time   WBC 9.2 03/27/2014 1428   RBC 4.11* 03/27/2014 1428   HGB 11.2* 03/27/2014 1428   HCT 35.9* 03/27/2014 1428   PLT 333 03/27/2014 1428   MCV 87.3 03/27/2014 1428   MCH 27.3 03/27/2014 1428   MCHC 31.2 03/27/2014 1428   RDW 15.5 03/27/2014 1428   LYMPHSABS 1.2 02/20/2014 1515   MONOABS 0.7 02/20/2014 1515   EOSABS 0.2 02/20/2014 1515   BASOSABS 0.0 02/20/2014 1515    Hgb A1C Lab Results  Component Value Date   HGBA1C 6.2* 02/23/2014     Assessment and Plan:

## 2014-05-18 DIAGNOSIS — N529 Male erectile dysfunction, unspecified: Secondary | ICD-10-CM | POA: Insufficient documentation

## 2014-05-18 DIAGNOSIS — M109 Gout, unspecified: Secondary | ICD-10-CM | POA: Insufficient documentation

## 2014-05-18 NOTE — Assessment & Plan Note (Signed)
No recent flares on colchicine He is concerned about cost of med- I found him a coupon where he could get it for 15$ monthly RX refilled today Will check uric acid level in 6 months.

## 2014-05-18 NOTE — Assessment & Plan Note (Signed)
BP fine today No meds Will monitor

## 2014-05-18 NOTE — Assessment & Plan Note (Signed)
Viagra prn 

## 2014-05-18 NOTE — Assessment & Plan Note (Signed)
LDL at goal on Lipitor Will repeat lipid profile and CMET in 6 months

## 2014-05-18 NOTE — Assessment & Plan Note (Signed)
Rate and rhythm controlled on Diltiazem and Metoprolol.  Continue Coumadin Reviewed most recent INR Continue to follow with cardiology

## 2014-05-18 NOTE — Patient Instructions (Signed)

## 2014-05-18 NOTE — Assessment & Plan Note (Signed)
On HD via left forearm AV fistula Continue to follow with nephrology

## 2014-05-18 NOTE — Assessment & Plan Note (Signed)
Euvolemic on exam today BP controlled Continue to follow with cardiology

## 2014-05-18 NOTE — Assessment & Plan Note (Signed)
Last A1C reviewed Flu UTD He declines Pneumonia vaccine today Foot exam today Encouraged him to continue with yearly eye exams Continue Lantus 50 units daily Repeat A1C in 6 months

## 2014-05-21 ENCOUNTER — Telehealth (HOSPITAL_COMMUNITY): Payer: Self-pay | Admitting: Vascular Surgery

## 2014-05-21 ENCOUNTER — Encounter (HOSPITAL_BASED_OUTPATIENT_CLINIC_OR_DEPARTMENT_OTHER): Payer: 59

## 2014-05-21 NOTE — Telephone Encounter (Signed)
Pt called he has equipment that needs to be picked up by [facility] , [facility] told him they need a order from the doctor before they can pick up the equipment.. Please advise

## 2014-05-23 ENCOUNTER — Other Ambulatory Visit: Payer: Self-pay

## 2014-05-23 NOTE — Telephone Encounter (Signed)
Pt left v/m requesting rx hydrocodone apap. Call when ready for pick up. Pts previous doctor prescribed hydrocodone apap for both arms hurting; lt arm is worse.Pt is out of med. Pt request cb.

## 2014-05-23 NOTE — Telephone Encounter (Signed)
When I met with him at his new pt appt, He told me he was not taking Hydrocodone. Why is he requesting this medication now?

## 2014-05-24 NOTE — Telephone Encounter (Signed)
Pt left v/m requesting cb about hydrocodone apap.

## 2014-05-25 NOTE — Telephone Encounter (Signed)
Patient reuqests oxygen supplies picked up as he no longer needs/uses them.  Per Joseph [name] ok to send in order.  Renee Pain

## 2014-05-31 ENCOUNTER — Ambulatory Visit (INDEPENDENT_AMBULATORY_CARE_PROVIDER_SITE_OTHER): Payer: 59 | Admitting: *Deleted

## 2014-05-31 DIAGNOSIS — I482 Chronic atrial fibrillation, unspecified: Secondary | ICD-10-CM

## 2014-05-31 LAB — POCT INR: INR: 2

## 2014-06-21 ENCOUNTER — Ambulatory Visit (INDEPENDENT_AMBULATORY_CARE_PROVIDER_SITE_OTHER): Payer: 59 | Admitting: *Deleted

## 2014-06-21 DIAGNOSIS — I4891 Unspecified atrial fibrillation: Secondary | ICD-10-CM

## 2014-06-21 DIAGNOSIS — I482 Chronic atrial fibrillation, unspecified: Secondary | ICD-10-CM

## 2014-06-21 LAB — POCT INR: INR: 1.7

## 2014-07-03 ENCOUNTER — Ambulatory Visit (HOSPITAL_COMMUNITY)
Admission: RE | Admit: 2014-07-03 | Discharge: 2014-07-03 | Disposition: A | Discharge status: Home / Self Care | Payer: 59 | Source: Ambulatory Visit | Attending: Cardiology | Admitting: Cardiology

## 2014-07-03 ENCOUNTER — Ambulatory Visit (HOSPITAL_COMMUNITY)
Admission: RE | Admit: 2014-07-03 | Discharge: 2014-07-03 | Disposition: A | Discharge status: Home / Self Care | Payer: 59 | Source: Ambulatory Visit | Attending: Internal Medicine | Admitting: Internal Medicine

## 2014-07-03 VITALS — BP 120/58 | HR 132 | Wt 200.8 lb

## 2014-07-03 DIAGNOSIS — I5032 Chronic diastolic (congestive) heart failure: Secondary | ICD-10-CM

## 2014-07-03 DIAGNOSIS — R0602 Shortness of breath: Secondary | ICD-10-CM | POA: Diagnosis present

## 2014-07-03 DIAGNOSIS — I509 Heart failure, unspecified: Secondary | ICD-10-CM | POA: Diagnosis not present

## 2014-07-03 DIAGNOSIS — I272 Other secondary pulmonary hypertension: Secondary | ICD-10-CM | POA: Diagnosis not present

## 2014-07-03 DIAGNOSIS — I270 Primary pulmonary hypertension: Secondary | ICD-10-CM

## 2014-07-03 DIAGNOSIS — I4819 Other persistent atrial fibrillation: Secondary | ICD-10-CM

## 2014-07-03 DIAGNOSIS — I481 Persistent atrial fibrillation: Secondary | ICD-10-CM

## 2014-07-03 DIAGNOSIS — N186 End stage renal disease: Secondary | ICD-10-CM | POA: Insufficient documentation

## 2014-07-03 MED ORDER — METOPROLOL TARTRATE 100 MG PO TABS: ORAL_TABLET | ORAL | Status: DC

## 2014-07-03 NOTE — Progress Notes (Signed)
Patient ID: Joseph [name], male   DOB: [birth year], 60 y.o.   MRN: [id number] Referring MD: Dr. Caryl Comes Nephrology: Dr Florene Glen   59 yo with history of CKD, chronic atrial fibrillation, and chronic diastolic CHF was referred to CHF clinic for evaluation of CHF and elevated PA pressure on echo.  Patient has had renal failure, probably due to HTN and diabetes.  He follows with Dr. Florene Glen.  He has been in atrial fibrillation since 6/13 when he failed a cardioversion and it was decided to leave him in atrial fibrillation.  He is now on coumadin.  Most recent echo in 4/15 showed EF 83-38% with PA systolic pressure 85 mmHg.   RHC in 6/15 showing elevated right and left heart filling pressures with moderate pulmonary hypertension, primarily pulmonary venous hypertension.    He continued to struggle with renal failure and volume overload.  He was admitted in 11/15 with acute on chronic diastolic CHF and AKI on CKD. He was started on oxygen.  He was started on hemodialysis for volume removal at that time.    HR has been higher recently.  Metoprolol was cut back to just 50 mg qhs due to low BP at HD.  He also says that his dry weight may have been raised recently due to cramping.  He has been short of breath for a week.  No fever or cough.  He is short of breath with hills or inclines, he has orthopnea.  His oxygen saturation when he checks at home will drop at times to the low 80s.   Labs (8/14): K 4.4, creatinine 3.7 Labs (6/15): K 4.5, creatinine 4.88 Labs (10/30/13) : K 4.1 Creatinine 4.22 Labs (01/15/14): K 3.8 creatinine 5.57 Labs (12/15): HCT 30.9  PMH: 1. CKD: Followed by Dr. Florene Glen, likely due to HTN and diabetes. Progression to ESRD in 11/15.  2. Atrial fibrillation: Chronic.  DCCV attempted in 6/13 but reverted to atrial fibrillation.  Rate control since that time.  3. H/o heavy ETOH, now does not drink. 4. Type II diabetes 5. HTN 6. Chronic diastolic CHF with elevated PA pressure: Echo (4/15) with EF  55-60%, mild MR, PA systolic pressure 85 mmHg, RV normal per report. RHC (6/15) with mean RA 12, PA 68/28 mean 44, mean PCWP 23 with prominent v-waves, CI 3.5, PVR 2.8 WU.   SH: On disability, prior smoker but quit, prior heavy ETOH but quit, lives in [place].  FH: Mother with valve replacement, sister with ESRD.   ROS: All systems reviewed and negative except as per HPI.   Current Outpatient Prescriptions  Medication Sig Dispense Refill  . atorvastatin (LIPITOR) 40 MG tablet Take 40 mg by mouth daily.    Marland Kitchen b complex-vitamin c-folic acid (NEPHRO-VITE) 0.8 MG TABS tablet Take 1 tablet by mouth daily.    Marland Kitchen diltiazem (CARDIZEM CD) 120 MG 24 hr capsule Take 1 capsule (120 mg total) by mouth daily. 30 capsule 0  . LANTUS SOLOSTAR 100 UNIT/ML injection Inject 50 Units into the skin daily. Sliding scale    . metoprolol (LOPRESSOR) 100 MG tablet Take 1 tablet twice a day except dialysis days only take 1 tablet. 60 tablet 6  . ONETOUCH VERIO test strip     . warfarin (COUMADIN) 5 MG tablet Take 1 tablet (5 mg total) by mouth daily. 30 tablet 0  . ethyl chloride spray   6  . RENVELA 800 MG tablet     . sildenafil (VIAGRA) 100 MG tablet Take 100 mg  by mouth daily as needed for erectile dysfunction.     No current facility-administered medications for this encounter.    BP 120/58 mmHg  Pulse 132  Wt 200 lb 12.8 oz (91.082 kg)  SpO2 93% General: NAD Dyspnea at rest Neck: Thick, JVP 14 cm.  no thyromegaly or thyroid nodule.  Lungs: CTAB Cardiac: Irregular S1S2, no S3S4.  1+ ankle edema.  No carotid bruit.  Normal pedal pulses.  Abdomen: Soft, nontender, no hepatosplenomegaly, nondistended.  Skin: Intact without lesions or rashes.  Neurologic: Alert and oriented x 3.  Psych: Normal affect. Extremities: No clubbing or cyanosis.   Assessment/Plan: 1. Chronic diastolic CHF: He is volume overloaded on exam with NYHA class III symptoms despite HD.  Apparently, they are not taking as much fluid  off now due to cramping.   - I think that his dry weight needs to be lowered.  He will talk to the dialysis staff when he is there tomorrow.   - I will get echocardiogram.  - CXR, rule out PNA.  2. Atrial fibrillation: Chronic. He is on metoprolol and diltiazem CD for rate control.  HR high today, he has cut back on metoprolol considerably.  I will have him increase metoprolol back to 100 mg bid.  The only difference will be that he will not take the morning metoprolol dose on HD days. He is on warfarin, will get CBC today.  3. OSA: Suspected. Will arrange sleep study.   [name],MD 07/03/2014

## 2014-07-03 NOTE — Patient Instructions (Addendum)
INCREASE Metoprolol to 1 tablet twice a day except dialysis days take 1 tablet in the evening.    Your provider has requested a chest x-ray.  Your provider has requested an  Echo.  Your provider has requested a Sleep study.  FOLLOW UP in 6 months.

## 2014-07-05 ENCOUNTER — Ambulatory Visit (INDEPENDENT_AMBULATORY_CARE_PROVIDER_SITE_OTHER): Payer: 59 | Admitting: *Deleted

## 2014-07-05 DIAGNOSIS — I482 Chronic atrial fibrillation, unspecified: Secondary | ICD-10-CM

## 2014-07-05 DIAGNOSIS — I4819 Other persistent atrial fibrillation: Secondary | ICD-10-CM

## 2014-07-05 DIAGNOSIS — I481 Persistent atrial fibrillation: Secondary | ICD-10-CM

## 2014-07-05 LAB — POCT INR: INR: 1.7

## 2014-07-19 ENCOUNTER — Ambulatory Visit (INDEPENDENT_AMBULATORY_CARE_PROVIDER_SITE_OTHER): Payer: 59 | Admitting: *Deleted

## 2014-07-19 DIAGNOSIS — I482 Chronic atrial fibrillation, unspecified: Secondary | ICD-10-CM

## 2014-07-19 DIAGNOSIS — I4819 Other persistent atrial fibrillation: Secondary | ICD-10-CM

## 2014-07-19 DIAGNOSIS — I4891 Unspecified atrial fibrillation: Secondary | ICD-10-CM

## 2014-07-19 DIAGNOSIS — I481 Persistent atrial fibrillation: Secondary | ICD-10-CM | POA: Diagnosis not present

## 2014-07-19 LAB — POCT INR: INR: 2.6

## 2014-07-31 DIAGNOSIS — F1010 Alcohol abuse, uncomplicated: Secondary | ICD-10-CM | POA: Insufficient documentation

## 2014-07-31 DIAGNOSIS — N186 End stage renal disease: Secondary | ICD-10-CM | POA: Insufficient documentation

## 2014-07-31 DIAGNOSIS — E119 Type 2 diabetes mellitus without complications: Secondary | ICD-10-CM | POA: Insufficient documentation

## 2014-07-31 DIAGNOSIS — D649 Anemia, unspecified: Secondary | ICD-10-CM | POA: Insufficient documentation

## 2014-07-31 DIAGNOSIS — Z5181 Encounter for therapeutic drug level monitoring: Secondary | ICD-10-CM | POA: Insufficient documentation

## 2014-07-31 DIAGNOSIS — I10 Essential (primary) hypertension: Secondary | ICD-10-CM | POA: Insufficient documentation

## 2014-07-31 DIAGNOSIS — G453 Amaurosis fugax: Secondary | ICD-10-CM | POA: Insufficient documentation

## 2014-08-07 ENCOUNTER — Ambulatory Visit (HOSPITAL_COMMUNITY)
Admission: RE | Admit: 2014-08-07 | Discharge: 2014-08-07 | Disposition: A | Discharge status: Home / Self Care | Payer: 59 | Source: Ambulatory Visit | Attending: Cardiology | Admitting: Cardiology

## 2014-08-07 DIAGNOSIS — I5032 Chronic diastolic (congestive) heart failure: Secondary | ICD-10-CM | POA: Diagnosis present

## 2014-08-07 DIAGNOSIS — I509 Heart failure, unspecified: Secondary | ICD-10-CM | POA: Diagnosis not present

## 2014-08-07 DIAGNOSIS — I270 Primary pulmonary hypertension: Secondary | ICD-10-CM | POA: Insufficient documentation

## 2014-08-07 DIAGNOSIS — I272 Pulmonary hypertension, unspecified: Secondary | ICD-10-CM

## 2014-08-07 NOTE — Progress Notes (Signed)
Echocardiogram 2D Echocardiogram has been performed.  Joseph [name] 08/07/2014, 9:08 AM

## 2014-08-09 ENCOUNTER — Ambulatory Visit (INDEPENDENT_AMBULATORY_CARE_PROVIDER_SITE_OTHER): Payer: 59 | Admitting: *Deleted

## 2014-08-09 DIAGNOSIS — I482 Chronic atrial fibrillation, unspecified: Secondary | ICD-10-CM

## 2014-08-09 DIAGNOSIS — I481 Persistent atrial fibrillation: Secondary | ICD-10-CM

## 2014-08-09 DIAGNOSIS — I4819 Other persistent atrial fibrillation: Secondary | ICD-10-CM

## 2014-08-09 LAB — POCT INR: INR: 2.2

## 2014-08-14 ENCOUNTER — Other Ambulatory Visit: Payer: Self-pay | Admitting: Internal Medicine

## 2014-08-14 NOTE — Telephone Encounter (Signed)
Last filled 05/17/2014 with 2 refills--please advise

## 2014-09-06 ENCOUNTER — Ambulatory Visit (INDEPENDENT_AMBULATORY_CARE_PROVIDER_SITE_OTHER): Payer: 59 | Admitting: *Deleted

## 2014-09-06 DIAGNOSIS — I482 Chronic atrial fibrillation, unspecified: Secondary | ICD-10-CM

## 2014-09-06 DIAGNOSIS — Z5181 Encounter for therapeutic drug level monitoring: Secondary | ICD-10-CM | POA: Diagnosis not present

## 2014-09-06 DIAGNOSIS — I481 Persistent atrial fibrillation: Secondary | ICD-10-CM

## 2014-09-06 DIAGNOSIS — I4819 Other persistent atrial fibrillation: Secondary | ICD-10-CM

## 2014-09-06 LAB — POCT INR: INR: 2

## 2014-10-01 ENCOUNTER — Ambulatory Visit (HOSPITAL_BASED_OUTPATIENT_CLINIC_OR_DEPARTMENT_OTHER): Payer: 59 | Attending: Cardiology

## 2014-10-15 ENCOUNTER — Other Ambulatory Visit: Payer: Self-pay

## 2014-10-18 ENCOUNTER — Encounter: Payer: Self-pay | Admitting: Podiatry

## 2014-10-18 ENCOUNTER — Ambulatory Visit (INDEPENDENT_AMBULATORY_CARE_PROVIDER_SITE_OTHER): Payer: 59 | Admitting: *Deleted

## 2014-10-18 ENCOUNTER — Ambulatory Visit (INDEPENDENT_AMBULATORY_CARE_PROVIDER_SITE_OTHER): Payer: 59 | Admitting: Podiatry

## 2014-10-18 DIAGNOSIS — L84 Corns and callosities: Secondary | ICD-10-CM

## 2014-10-18 DIAGNOSIS — I481 Persistent atrial fibrillation: Secondary | ICD-10-CM

## 2014-10-18 DIAGNOSIS — L851 Acquired keratosis [keratoderma] palmaris et plantaris: Secondary | ICD-10-CM

## 2014-10-18 DIAGNOSIS — Z5181 Encounter for therapeutic drug level monitoring: Secondary | ICD-10-CM

## 2014-10-18 DIAGNOSIS — I482 Chronic atrial fibrillation, unspecified: Secondary | ICD-10-CM

## 2014-10-18 DIAGNOSIS — M79676 Pain in unspecified toe(s): Secondary | ICD-10-CM | POA: Diagnosis not present

## 2014-10-18 DIAGNOSIS — B351 Tinea unguium: Secondary | ICD-10-CM

## 2014-10-18 DIAGNOSIS — E1140 Type 2 diabetes mellitus with diabetic neuropathy, unspecified: Secondary | ICD-10-CM

## 2014-10-18 DIAGNOSIS — M79674 Pain in right toe(s): Secondary | ICD-10-CM

## 2014-10-18 DIAGNOSIS — M79675 Pain in left toe(s): Secondary | ICD-10-CM

## 2014-10-18 DIAGNOSIS — I4819 Other persistent atrial fibrillation: Secondary | ICD-10-CM

## 2014-10-18 DIAGNOSIS — E1149 Type 2 diabetes mellitus with other diabetic neurological complication: Secondary | ICD-10-CM

## 2014-10-18 LAB — PROTIME-INR
INR: 12.9 ratio — AB (ref 0.8–1.0)
Prothrombin Time: 132.9 s (ref 9.6–13.1)

## 2014-10-18 LAB — POCT INR: INR: >8

## 2014-10-18 MED ORDER — PHYTONADIONE 5 MG PO TABS: 5.0000 mg | ORAL_TABLET | Freq: Once | ORAL | Status: DC

## 2014-10-18 NOTE — Progress Notes (Signed)
Patient ID: Joseph [name], male   DOB: [birth year], 60 y.o.   MRN: [id number] Complaint:  Visit Type: Patient returns to my office for continued preventative foot care services. Complaint: Patient states" my nails have grown long and thick and become painful to walk and wear shoes" Patient has been diagnosed with DM with neuropathy. He presents for preventative foot care services. No changes to ROS.  He has skin lesion under fifth tie right foot.  No drainage noted  Podiatric Exam: Vascular: dorsalis pedis and posterior tibial pulses are palpable bilateral. Capillary return is immediate. Temperature gradient is WNL. Skin turgor WNL  Sensorium: Diminished Semmes Weinstein monofilament test. Normal tactile sensation bilaterally. Nail Exam: Pt has thick disfigured discolored nails with subungual debris noted bilateral entire nail hallux through fifth toenails.  His second toenail left foot self avulsed due to it being unattached.  His fourth toenail right foot is half avulsed distally. Ulcer Exam: There is no evidence of ulcer or pre-ulcerative changes or infection. Orthopedic Exam: Muscle tone and strength are WNL. No limitations in general ROM. No crepitus or effusions noted. Foot type and digits show no abnormalities. Bony prominences are unremarkable. Skin: No Porokeratosis. No infection or ulcers.  He has a circumscribed black skin lesion with callus noted under fifth toe.  No signs of redness or swelling.  Diagnosis:  Tinea unguium, Pain in right toe, pain in left toes  Treatment & Plan Procedures and Treatment: Consent by patient was obtained for treatment procedures. The patient understood the discussion of treatment and procedures well. All questions were answered thoroughly reviewed. Debridement of mycotic and hypertrophic toenails, 1 through 5 bilateral and clearing of subungual debris. No ulceration, no infection noted.  Return Visit-Office Procedure: Patient instructed to return to the office  for a follow up visit 3 months for continued evaluation and treatment. I questioned his neuropathy and he told me to look it up in his previous notes.  It was said belligerantly.  I then proceeded to treat the patient through debridement.  Upon leaving,  said i was the best doctor he saw here and he was making up for his belligerent remark.  I therefore left the room to continue my work and he took offence to my leaving as he left.

## 2014-10-20 ENCOUNTER — Ambulatory Visit (INDEPENDENT_AMBULATORY_CARE_PROVIDER_SITE_OTHER): Payer: 59 | Admitting: Family Medicine

## 2014-10-20 ENCOUNTER — Encounter: Payer: Self-pay | Admitting: Family Medicine

## 2014-10-20 VITALS — BP 138/90 | HR 80 | Temp 98.1°F | Ht 72.0 in | Wt 198.0 lb

## 2014-10-20 DIAGNOSIS — L282 Other prurigo: Secondary | ICD-10-CM

## 2014-10-20 MED ORDER — PREDNISONE 20 MG PO TABS: ORAL_TABLET | ORAL | Status: DC

## 2014-10-20 NOTE — Progress Notes (Signed)
Pre visit review using our clinic review tool, if applicable. No additional management support is needed unless otherwise documented below in the visit note. 

## 2014-10-20 NOTE — Progress Notes (Signed)
PCP: Webb Silversmith, NP  Subjective:  Joseph [name] is a 59 y.o. year old very pleasant male patient who presents with itching/rash/hives -Dialysis yesterday (MWF). ITching with visible hives for 3 weeks. Had been placed on gabapentin for itching without relief as well as  hydroxyzine later. Tried Benadryl, zyrtec. Itch primarily on trunk and arms. No involvement in face. Multiple excoriations and has even made himself bleed. Renal Thought phosphorus related at first potentially.   ROS-not ill appearing, no fever/chills.  Not immunocompromised. No mucus membrane involvement.   Pertinent Past Medical History- a fib on coumadin, diastolic CHF, ESRD, DM with last a1c 6.2  Medications- reviewed  Current Outpatient Prescriptions  Medication Sig Dispense Refill  . atorvastatin (LIPITOR) 40 MG tablet Take 40 mg by mouth daily.    Marland Kitchen b complex-vitamin c-folic acid (NEPHRO-VITE) 0.8 MG TABS tablet Take 1 tablet by mouth daily.    . colchicine 0.6 MG tablet TAKE 1 TABLET (0.6 MG TOTAL) BY MOUTH 2 (TWO) TIMES DAILY. 60 tablet 2  . diltiazem (CARDIZEM CD) 120 MG 24 hr capsule Take 1 capsule (120 mg total) by mouth daily. 30 capsule 0  . ethyl chloride spray   6  . LANTUS SOLOSTAR 100 UNIT/ML injection Inject 50 Units into the skin daily. Sliding scale    . metoprolol (LOPRESSOR) 100 MG tablet Take 1 tablet twice a day except dialysis days only take 1 tablet. 60 tablet 6  . ONETOUCH VERIO test strip     . phytonadione (VITAMIN K) 5 MG tablet Take 1 tablet (5 mg total) by mouth once. 1 tablet 0  . RENVELA 800 MG tablet     . sildenafil (VIAGRA) 100 MG tablet Take 100 mg by mouth daily as needed for erectile dysfunction.    Marland Kitchen warfarin (COUMADIN) 5 MG tablet Take 1 tablet (5 mg total) by mouth daily. 30 tablet 0   Objective: BP 138/90 mmHg  Pulse 80  Temp(Src) 98.1 F (36.7 C) (Oral)  Ht 6' (1.829 m)  Wt 198 lb (89.812 kg)  BMI 26.85 kg/m2  SpO2 90% Gen: NAD, resting comfortably HEENT: oropharynx  normal with no lip or tongue swelling CV: RRR no murmurs rubs or gallops Lungs: CTAB no crackles, wheeze, rhonchi Abdomen: soft/nontender/nondistended/normal bowel sounds. No rebound or guarding.  Ext: trace edema Skin: appears uncomfortable throughout visit, scratches constantly at back with hands or shirt There are several excoriations on upper back, some areas he has even opened. No hives. Itches in other areas and no visible lesions, some erythematous papules on bilateral arms not scratching at   Assessment/Plan:  Pruritic rash (though rash not visible today, just excoriation) with reported hives Failed therapy with zyrtec, benadryl, hydroxyzine, gabapentin all through renal. Unclear etiology. 3 weeks in duration. Do not see any actual hives today but that is what is reported. We will trial an 8 day course of prednisone recognizing well controlled DM. Will call PCP for help with insulin sliding scale if needed. He will return to PCP week after next to reassess or sooner if worsening. Depending on findings at that time, could consider dermatology referral (looks more like papules on arms) vs. Allergy referral (if truly hives) vs embarking on PCP workup for chronic urticaria. Fortunately no red flags like lip swelling, trouble breathing.   Sooner Return precautions advised.   Meds ordered this encounter  Medications  . gabapentin (NEURONTIN) 100 MG capsule    Sig: Take 100 mg by mouth 3 (three) times daily.  Marland Kitchen  hydrOXYzine (ATARAX/VISTARIL) 25 MG tablet    Sig: Take 25 mg by mouth 3 (three) times daily as needed.  . predniSONE (DELTASONE) 20 MG tablet    Sig: Take 2 tabs daily for 4 days, then 1 tab daily for 4 days then stop    Dispense:  12 tablet    Refill:  0

## 2014-10-20 NOTE — Patient Instructions (Signed)
Itch/rash  Unclear cause  Mainly just see the areas that have been intensely scratched today  Trial prednisone 8 days  Follow up with PCP week after next  If new or worsening symptoms, be seen sooner Also primary care can help you with managing sugar

## 2014-10-23 ENCOUNTER — Telehealth: Payer: Self-pay

## 2014-10-23 ENCOUNTER — Ambulatory Visit (INDEPENDENT_AMBULATORY_CARE_PROVIDER_SITE_OTHER): Payer: 59 | Admitting: *Deleted

## 2014-10-23 DIAGNOSIS — R21 Rash and other nonspecific skin eruption: Secondary | ICD-10-CM

## 2014-10-23 DIAGNOSIS — I482 Chronic atrial fibrillation, unspecified: Secondary | ICD-10-CM

## 2014-10-23 DIAGNOSIS — Z5181 Encounter for therapeutic drug level monitoring: Secondary | ICD-10-CM | POA: Diagnosis not present

## 2014-10-23 DIAGNOSIS — I4819 Other persistent atrial fibrillation: Secondary | ICD-10-CM

## 2014-10-23 DIAGNOSIS — I481 Persistent atrial fibrillation: Secondary | ICD-10-CM | POA: Diagnosis not present

## 2014-10-23 LAB — POCT INR: INR: 3.8

## 2014-10-23 NOTE — Telephone Encounter (Signed)
Joseph [name] (do not see DPR signed) left v/m requesting referral to dermatologist for rash and itching; pt seen 10/20/14 at Sat clinic. F/U appt scheduled with Dr Glori Bickers 10/24/14 was cancelled by pt. Please advise.

## 2014-10-23 NOTE — Telephone Encounter (Signed)
I will do ref in light of PCP absence

## 2014-10-23 NOTE — Telephone Encounter (Signed)
Pt wife, [name] aware of appt scheduled Thursday July, 7th at 230pm with Lennie Odor, PA at [facility].

## 2014-10-24 ENCOUNTER — Ambulatory Visit: Payer: 59 | Admitting: Family Medicine

## 2014-10-28 ENCOUNTER — Emergency Department (HOSPITAL_COMMUNITY)
Admission: EM | Admit: 2014-10-28 | Discharge: 2014-10-28 | Disposition: A | Discharge status: Home / Self Care | Payer: 59 | Attending: Emergency Medicine | Admitting: Emergency Medicine

## 2014-10-28 ENCOUNTER — Encounter (HOSPITAL_COMMUNITY): Payer: Self-pay | Admitting: Emergency Medicine

## 2014-10-28 DIAGNOSIS — Z8701 Personal history of pneumonia (recurrent): Secondary | ICD-10-CM | POA: Diagnosis not present

## 2014-10-28 DIAGNOSIS — N183 Chronic kidney disease, stage 3 (moderate): Secondary | ICD-10-CM | POA: Diagnosis not present

## 2014-10-28 DIAGNOSIS — R011 Cardiac murmur, unspecified: Secondary | ICD-10-CM | POA: Insufficient documentation

## 2014-10-28 DIAGNOSIS — I509 Heart failure, unspecified: Secondary | ICD-10-CM | POA: Insufficient documentation

## 2014-10-28 DIAGNOSIS — L298 Other pruritus: Secondary | ICD-10-CM | POA: Insufficient documentation

## 2014-10-28 DIAGNOSIS — E1342 Other specified diabetes mellitus with diabetic polyneuropathy: Secondary | ICD-10-CM | POA: Insufficient documentation

## 2014-10-28 DIAGNOSIS — Z87891 Personal history of nicotine dependence: Secondary | ICD-10-CM | POA: Diagnosis not present

## 2014-10-28 DIAGNOSIS — Z7901 Long term (current) use of anticoagulants: Secondary | ICD-10-CM | POA: Insufficient documentation

## 2014-10-28 DIAGNOSIS — Z79899 Other long term (current) drug therapy: Secondary | ICD-10-CM | POA: Insufficient documentation

## 2014-10-28 DIAGNOSIS — R0602 Shortness of breath: Secondary | ICD-10-CM | POA: Diagnosis present

## 2014-10-28 DIAGNOSIS — I129 Hypertensive chronic kidney disease with stage 1 through stage 4 chronic kidney disease, or unspecified chronic kidney disease: Secondary | ICD-10-CM | POA: Insufficient documentation

## 2014-10-28 DIAGNOSIS — L299 Pruritus, unspecified: Secondary | ICD-10-CM

## 2014-10-28 LAB — CBC WITH DIFFERENTIAL/PLATELET
Basophils Absolute: 0 10*3/uL (ref 0.0–0.1)
Basophils Relative: 0 % (ref 0–1)
EOS ABS: 0.3 10*3/uL (ref 0.0–0.7)
EOS PCT: 3 % (ref 0–5)
HCT: 36.8 % — ABNORMAL LOW (ref 39.0–52.0)
Hemoglobin: 11.8 g/dL — ABNORMAL LOW (ref 13.0–17.0)
LYMPHS ABS: 1.5 10*3/uL (ref 0.7–4.0)
Lymphocytes Relative: 16 % (ref 12–46)
MCH: 31.6 pg (ref 26.0–34.0)
MCHC: 32.1 g/dL (ref 30.0–36.0)
MCV: 98.4 fL (ref 78.0–100.0)
MONO ABS: 1.1 10*3/uL — AB (ref 0.1–1.0)
Monocytes Relative: 12 % (ref 3–12)
Neutro Abs: 6.5 10*3/uL (ref 1.7–7.7)
Neutrophils Relative %: 69 % (ref 43–77)
PLATELETS: 115 10*3/uL — AB (ref 150–400)
RBC: 3.74 MIL/uL — ABNORMAL LOW (ref 4.22–5.81)
RDW: 18.5 % — ABNORMAL HIGH (ref 11.5–15.5)
WBC: 9.4 10*3/uL (ref 4.0–10.5)

## 2014-10-28 LAB — COMPREHENSIVE METABOLIC PANEL
ALBUMIN: 3.1 g/dL — AB (ref 3.5–5.0)
ALK PHOS: 113 U/L (ref 38–126)
ALT: 78 U/L — AB (ref 17–63)
AST: 98 U/L — ABNORMAL HIGH (ref 15–41)
Anion gap: 13 (ref 5–15)
BUN: 79 mg/dL — AB (ref 6–20)
CHLORIDE: 96 mmol/L — AB (ref 101–111)
CO2: 26 mmol/L (ref 22–32)
CREATININE: 6.7 mg/dL — AB (ref 0.61–1.24)
Calcium: 8.3 mg/dL — ABNORMAL LOW (ref 8.9–10.3)
GFR calc non Af Amer: 8 mL/min — ABNORMAL LOW (ref >60)
GFR, EST AFRICAN AMERICAN: 9 mL/min — AB (ref >60)
Glucose, Bld: 189 mg/dL — ABNORMAL HIGH (ref 65–99)
Potassium: 4.2 mmol/L (ref 3.5–5.1)
Sodium: 135 mmol/L (ref 135–145)
TOTAL PROTEIN: 6 g/dL — AB (ref 6.5–8.1)
Total Bilirubin: 1.3 mg/dL — ABNORMAL HIGH (ref 0.3–1.2)

## 2014-10-28 LAB — LIPASE, BLOOD: Lipase: 105 U/L — ABNORMAL HIGH (ref 22–51)

## 2014-10-28 MED ORDER — DIPHENHYDRAMINE HCL 25 MG PO CAPS
50.0000 mg | ORAL_CAPSULE | Freq: Once | ORAL | Status: AC
  Administered 2014-10-28: 50 mg via ORAL
  Filled 2014-10-28: qty 2

## 2014-10-28 MED ORDER — DIAZEPAM 5 MG PO TABS: 5.0000 mg | ORAL_TABLET | Freq: Four times a day (QID) | ORAL | Status: DC | PRN

## 2014-10-28 MED ORDER — DIAZEPAM 2 MG PO TABS
2.0000 mg | ORAL_TABLET | Freq: Once | ORAL | Status: AC
  Administered 2014-10-28: 2 mg via ORAL
  Filled 2014-10-28: qty 1

## 2014-10-28 NOTE — Discharge Instructions (Signed)
Pruritus  °Pruritus is an itch. There are many different problems that can cause an itch. Dry skin is one of the most common causes of itching. Most cases of itching do not require medical attention.  °HOME CARE INSTRUCTIONS  °Make sure your skin is moistened on a regular basis. A moisturizer that contains petroleum jelly is best for keeping moisture in your skin. If you develop a rash, you may try the following for relief:  °· Use corticosteroid cream. °· Apply cool compresses to the affected areas. °· Bathe with Epsom salts or baking soda in the bathwater. °· Soak in colloidal oatmeal baths. These are available at your pharmacy. °· Apply baking soda paste to the rash. Stir water into baking soda until it reaches a paste-like consistency. °· Use an anti-itch lotion. °· Take over-the-counter diphenhydramine medicine by mouth as the instructions direct. °· Avoid scratching. Scratching may cause the rash to become infected. If itching is very bad, your caregiver may suggest prescription lotions or creams to lessen your symptoms. °· Avoid hot showers, which can make itching worse. A cold shower may help with itching as long as you use a moisturizer after the shower. °SEEK MEDICAL CARE IF: °The itching does not go away after several days. °Document Released: 12/17/2010 Document Revised: 08/21/2013 Document Reviewed: 12/17/2010 °Exit[employer]® Patient Information ©2015 [employer]. This information is not intended to replace advice given to you by your health care provider. Make sure you discuss any questions you have with your health care provider. ° °

## 2014-10-28 NOTE — ED Notes (Signed)
Pt reports feeling a little nauseated from not eating and taking pills. Pt given a sand which bag.

## 2014-10-28 NOTE — ED Notes (Signed)
Pt here for evaluation of itching X 1 month. Pt is a dialysis patient and states that he has dialysis tomorrow and can barely tolerate sitting through it due to itching. Pt reports his MD tried several different meds for itching and none are working. PT denies SOB at present but does sound short of breathe while talking. Pt admits to O2 sats dropping at home to 82% when he is lying down at home. Pt used to be on O2 but they took him off of it after he started dialysis in the winter.

## 2014-10-28 NOTE — ED Provider Notes (Signed)
CSN: [id number]     Arrival date & time 10/28/14  0715 History   First MD Initiated Contact with Patient 10/28/14 0725     Chief Complaint  Patient presents with  . Urticaria  . Shortness of Breath    at times*     (Consider location/radiation/quality/duration/timing/severity/associated sxs/prior Treatment) Patient is a 59 y.o. male presenting with general illness.  Illness Location:  Generalized, most prominent in back and upper extremities Quality:  Pruritis Severity:  Moderate Onset quality:  Gradual Timing:  Constant Progression:  Waxing and waning Chronicity:  New Context:  Seen by other providers without relief Relieved by:  Nothing Ineffective treatments:  Prednisone course, atarax Associated symptoms: no abdominal pain, no fever, no loss of consciousness and no shortness of breath     Past Medical History  Diagnosis Date  . Atrial fibrillation     a. 11/18/2011 s/p DCCV - 150J;  b. Anticoagulation w/ Apixaban;  c. 11/2011 back in afib->asymptomatic.  . H/O alcohol abuse     a. drinks heavily on the weekends.  . Hypertension   . Diabetes mellitus   . Heart murmur     a. 09/2011 Echo: EF 55-60%, Triv AI, Mild MR, mildly dil LA.  . Diabetic peripheral neuropathy   . CKD (chronic kidney disease), stage III   . CHF (congestive heart failure)   . Pneumonia    Past Surgical History  Procedure Laterality Date  . Eye sx  11/11/2011    left eye  . Cardioversion  11/18/2011    Procedure: CARDIOVERSION;  Surgeon: Josue Hector, MD;  Location[facility] ENDOSCOPY;  Service: Cardiovascular;  Laterality: N/A;  . Colonoscopy w/ biopsies and polypectomy    . Av fistula placement Left 02/13/2014    Procedure: INSERTION OF ARTERIOVENOUS (AV) GORE-TEX GRAFT ARM;  Surgeon: Angelia Mould, MD;  Location: [facility];  Service: Vascular;  Laterality: Left;  . Right heart catheterization N/A 10/11/2013    Procedure: RIGHT HEART CATH;  Surgeon: Larey Dresser, MD;  Location: [facility] CATH LAB;   Service: Cardiovascular;  Laterality: N/A;   Family History  Problem Relation Age of Onset  . Heart disease Mother     before age 34  . Diabetes Father   . Diabetes Sister   . Peripheral vascular disease Sister     amputation  . Cancer Neg Hx   . Stroke Neg Hx    History  Substance Use Topics  . Smoking status: Former Smoker -- 0.01 packs/day for 5 years    Types: Cigarettes    Quit date: 04/20/1994  . Smokeless tobacco: Never Used     Comment: some in college  . Alcohol Use: No    Review of Systems  Constitutional: Negative for fever.  Respiratory: Negative for shortness of breath.   Gastrointestinal: Negative for abdominal pain.  Neurological: Negative for loss of consciousness.  All other systems reviewed and are negative.     Allergies  Dilaudid  Home Medications   Prior to Admission medications   Medication Sig Start Date End Date Taking? Authorizing Provider  atorvastatin (LIPITOR) 40 MG tablet Take 40 mg by mouth daily.   Yes Historical Provider, MD  b complex-vitamin c-folic acid (NEPHRO-VITE) 0.8 MG TABS tablet Take 1 tablet by mouth daily.   Yes Historical Provider, MD  colchicine 0.6 MG tablet TAKE 1 TABLET (0.6 MG TOTAL) BY MOUTH 2 (TWO) TIMES DAILY. 08/14/14  Yes Jearld Fenton, NP  diltiazem (CARDIZEM CD) 120 MG 24  hr capsule Take 1 capsule (120 mg total) by mouth daily. 03/03/14  Yes Donne Hazel, MD  ethyl chloride spray Apply 1 application topically every other day. Mon Wed Fri - done with dialysis 05/12/14  Yes Historical Provider, MD  LANTUS SOLOSTAR 100 UNIT/ML injection Inject 50 Units into the skin daily. Sliding scale 09/19/11  Yes Historical Provider, MD  metoprolol (LOPRESSOR) 100 MG tablet Take 1 tablet twice a day except dialysis days only take 1 tablet. 07/03/14  Yes Amy Estrella Deeds, NP  ONETOUCH VERIO test strip  03/05/14  Yes Historical Provider, MD  RENVELA 800 MG tablet Take 1,600 mg by mouth 2 (two) times daily.  05/16/14  Yes Historical  Provider, MD  sildenafil (VIAGRA) 100 MG tablet Take 100 mg by mouth daily as needed for erectile dysfunction.   Yes Historical Provider, MD  warfarin (COUMADIN) 5 MG tablet Take 1 tablet (5 mg total) by mouth daily. Patient taking differently: Take 5 mg by mouth daily. 7.5 mg Sun and Tues. 5 mg all other days 03/03/14  Yes Donne Hazel, MD  diazepam (VALIUM) 5 MG tablet Take 1 tablet (5 mg total) by mouth every 6 (six) hours as needed for sedation (itching). 10/28/14   Debby Freiberg, MD   BP 160/87 mmHg  Pulse 85  Temp(Src) 97.7 F (36.5 C) (Oral)  Resp 22  Ht 6' (1.829 m)  Wt 198 lb (89.812 kg)  BMI 26.85 kg/m2  SpO2 96% Physical Exam  Constitutional: He is oriented to person, place, and time. He appears well-developed and well-nourished.  HENT:  Head: Normocephalic and atraumatic.  Eyes: Conjunctivae and EOM are normal.  Neck: Normal range of motion. Neck supple.  Cardiovascular: Normal rate, regular rhythm and normal heart sounds.   Pulmonary/Chest: Effort normal and breath sounds normal. No respiratory distress.  Abdominal: He exhibits no distension. There is no tenderness. There is no rebound and no guarding.  Musculoskeletal: Normal range of motion.  Neurological: He is alert and oriented to person, place, and time.  Skin: Skin is warm and dry.  No urticaria.  Pt has papular lesions with central ulceration over upper extremities and upper back.  Multiple excoriations in various stages of healing  Vitals reviewed.   ED Course  Procedures (including critical care time) Labs Review Labs Reviewed  COMPREHENSIVE METABOLIC PANEL - Abnormal; Notable for the following:    Chloride 96 (*)    Glucose, Bld 189 (*)    BUN 79 (*)    Creatinine, Ser 6.70 (*)    Calcium 8.3 (*)    Total Protein 6.0 (*)    Albumin 3.1 (*)    AST 98 (*)    ALT 78 (*)    Total Bilirubin 1.3 (*)    GFR calc non Af Amer 8 (*)    GFR calc Af Amer 9 (*)    All other components within normal limits   CBC WITH DIFFERENTIAL/PLATELET - Abnormal; Notable for the following:    RBC 3.74 (*)    Hemoglobin 11.8 (*)    HCT 36.8 (*)    RDW 18.5 (*)    Platelets 115 (*)    Monocytes Absolute 1.1 (*)    All other components within normal limits  LIPASE, BLOOD - Abnormal; Notable for the following:    Lipase 105 (*)    All other components within normal limits    Imaging Review No results found.   EKG Interpretation None      MDM  Final diagnoses:  Uremic pruritus    59 y.o. male with pertinent PMH of ESRD (MWF), CHF, Afib presents with continued pruritis x 1 month.  Has tried numerous therapies without relief.  Has also seen dermatology for a skin biopsy, results pending.  On arrival vitals and physical exam as above.  Given benadryl and small amount of valium for sleep.  Very small relief of symptoms.  I had a detailed discussion with pt and family re: likely uremic pruritis, but also with strict return precautions for other symptoms.  No dyspnea on my history or signs of respiratory distress.  Wu with thrombocytopenia, however pt has had numerous changes in platelets in the past.  DC home in stable condition  I have reviewed all laboratory and imaging studies if ordered as above  1. Uremic pruritus         Debby Freiberg, MD 10/28/14 [phone]

## 2014-10-30 ENCOUNTER — Ambulatory Visit (INDEPENDENT_AMBULATORY_CARE_PROVIDER_SITE_OTHER): Payer: 59 | Admitting: *Deleted

## 2014-10-30 DIAGNOSIS — Z5181 Encounter for therapeutic drug level monitoring: Secondary | ICD-10-CM

## 2014-10-30 DIAGNOSIS — I4819 Other persistent atrial fibrillation: Secondary | ICD-10-CM

## 2014-10-30 DIAGNOSIS — I482 Chronic atrial fibrillation, unspecified: Secondary | ICD-10-CM

## 2014-10-30 DIAGNOSIS — I4891 Unspecified atrial fibrillation: Secondary | ICD-10-CM

## 2014-10-30 DIAGNOSIS — I481 Persistent atrial fibrillation: Secondary | ICD-10-CM | POA: Diagnosis not present

## 2014-10-30 LAB — POCT INR: INR: 4.3

## 2014-11-01 ENCOUNTER — Ambulatory Visit: Payer: 59 | Admitting: Internal Medicine

## 2014-11-05 ENCOUNTER — Encounter (HOSPITAL_COMMUNITY): Payer: Self-pay | Admitting: *Deleted

## 2014-11-05 ENCOUNTER — Emergency Department (HOSPITAL_COMMUNITY): Payer: 59

## 2014-11-05 ENCOUNTER — Inpatient Hospital Stay (HOSPITAL_COMMUNITY)
Admission: EM | Admit: 2014-11-05 | Discharge: 2014-11-09 | DRG: 291 | Disposition: A | Discharge status: Home / Self Care | Payer: 59 | Attending: Internal Medicine | Admitting: Internal Medicine

## 2014-11-05 DIAGNOSIS — E1122 Type 2 diabetes mellitus with diabetic chronic kidney disease: Secondary | ICD-10-CM | POA: Diagnosis present

## 2014-11-05 DIAGNOSIS — R791 Abnormal coagulation profile: Secondary | ICD-10-CM | POA: Insufficient documentation

## 2014-11-05 DIAGNOSIS — A047 Enterocolitis due to Clostridium difficile: Secondary | ICD-10-CM | POA: Diagnosis present

## 2014-11-05 DIAGNOSIS — M109 Gout, unspecified: Secondary | ICD-10-CM | POA: Diagnosis present

## 2014-11-05 DIAGNOSIS — E119 Type 2 diabetes mellitus without complications: Secondary | ICD-10-CM

## 2014-11-05 DIAGNOSIS — Z87891 Personal history of nicotine dependence: Secondary | ICD-10-CM

## 2014-11-05 DIAGNOSIS — G4733 Obstructive sleep apnea (adult) (pediatric): Secondary | ICD-10-CM | POA: Diagnosis present

## 2014-11-05 DIAGNOSIS — Z992 Dependence on renal dialysis: Secondary | ICD-10-CM

## 2014-11-05 DIAGNOSIS — E1142 Type 2 diabetes mellitus with diabetic polyneuropathy: Secondary | ICD-10-CM | POA: Diagnosis present

## 2014-11-05 DIAGNOSIS — Y95 Nosocomial condition: Secondary | ICD-10-CM | POA: Diagnosis present

## 2014-11-05 DIAGNOSIS — A0472 Enterocolitis due to Clostridium difficile, not specified as recurrent: Secondary | ICD-10-CM | POA: Diagnosis present

## 2014-11-05 DIAGNOSIS — E1165 Type 2 diabetes mellitus with hyperglycemia: Secondary | ICD-10-CM | POA: Diagnosis present

## 2014-11-05 DIAGNOSIS — I120 Hypertensive chronic kidney disease with stage 5 chronic kidney disease or end stage renal disease: Secondary | ICD-10-CM | POA: Diagnosis present

## 2014-11-05 DIAGNOSIS — E8770 Fluid overload, unspecified: Secondary | ICD-10-CM | POA: Diagnosis present

## 2014-11-05 DIAGNOSIS — I272 Other secondary pulmonary hypertension: Secondary | ICD-10-CM | POA: Diagnosis present

## 2014-11-05 DIAGNOSIS — Z23 Encounter for immunization: Secondary | ICD-10-CM | POA: Diagnosis not present

## 2014-11-05 DIAGNOSIS — Z9981 Dependence on supplemental oxygen: Secondary | ICD-10-CM

## 2014-11-05 DIAGNOSIS — L299 Pruritus, unspecified: Secondary | ICD-10-CM | POA: Diagnosis present

## 2014-11-05 DIAGNOSIS — R945 Abnormal results of liver function studies: Secondary | ICD-10-CM

## 2014-11-05 DIAGNOSIS — I10 Essential (primary) hypertension: Secondary | ICD-10-CM | POA: Diagnosis present

## 2014-11-05 DIAGNOSIS — I482 Chronic atrial fibrillation: Secondary | ICD-10-CM | POA: Diagnosis present

## 2014-11-05 DIAGNOSIS — K759 Inflammatory liver disease, unspecified: Secondary | ICD-10-CM | POA: Diagnosis present

## 2014-11-05 DIAGNOSIS — Z6826 Body mass index (BMI) 26.0-26.9, adult: Secondary | ICD-10-CM

## 2014-11-05 DIAGNOSIS — R188 Other ascites: Secondary | ICD-10-CM | POA: Diagnosis not present

## 2014-11-05 DIAGNOSIS — R21 Rash and other nonspecific skin eruption: Secondary | ICD-10-CM | POA: Diagnosis present

## 2014-11-05 DIAGNOSIS — I4891 Unspecified atrial fibrillation: Secondary | ICD-10-CM | POA: Diagnosis present

## 2014-11-05 DIAGNOSIS — E43 Unspecified severe protein-calorie malnutrition: Secondary | ICD-10-CM

## 2014-11-05 DIAGNOSIS — I5033 Acute on chronic diastolic (congestive) heart failure: Principal | ICD-10-CM | POA: Diagnosis present

## 2014-11-05 DIAGNOSIS — N186 End stage renal disease: Secondary | ICD-10-CM

## 2014-11-05 DIAGNOSIS — R0602 Shortness of breath: Secondary | ICD-10-CM | POA: Diagnosis present

## 2014-11-05 DIAGNOSIS — Z885 Allergy status to narcotic agent status: Secondary | ICD-10-CM

## 2014-11-05 DIAGNOSIS — Z794 Long term (current) use of insulin: Secondary | ICD-10-CM

## 2014-11-05 DIAGNOSIS — R7989 Other specified abnormal findings of blood chemistry: Secondary | ICD-10-CM | POA: Diagnosis present

## 2014-11-05 DIAGNOSIS — Z7901 Long term (current) use of anticoagulants: Secondary | ICD-10-CM

## 2014-11-05 DIAGNOSIS — Z7952 Long term (current) use of systemic steroids: Secondary | ICD-10-CM

## 2014-11-05 DIAGNOSIS — J189 Pneumonia, unspecified organism: Secondary | ICD-10-CM | POA: Diagnosis present

## 2014-11-05 DIAGNOSIS — R0600 Dyspnea, unspecified: Secondary | ICD-10-CM | POA: Diagnosis present

## 2014-11-05 HISTORY — DX: Sleep apnea, unspecified: G47.30

## 2014-11-05 LAB — CBC WITH DIFFERENTIAL/PLATELET
BASOS PCT: 0 % (ref 0–1)
Basophils Absolute: 0 10*3/uL (ref 0.0–0.1)
EOS ABS: 0 10*3/uL (ref 0.0–0.7)
EOS PCT: 0 % (ref 0–5)
HCT: 35.6 % — ABNORMAL LOW (ref 39.0–52.0)
Hemoglobin: 11.7 g/dL — ABNORMAL LOW (ref 13.0–17.0)
LYMPHS ABS: 0.7 10*3/uL (ref 0.7–4.0)
Lymphocytes Relative: 4 % — ABNORMAL LOW (ref 12–46)
MCH: 31 pg (ref 26.0–34.0)
MCHC: 32.9 g/dL (ref 30.0–36.0)
MCV: 94.2 fL (ref 78.0–100.0)
MONO ABS: 1.2 10*3/uL — AB (ref 0.1–1.0)
MONOS PCT: 8 % (ref 3–12)
NEUTROS PCT: 88 % — AB (ref 43–77)
Neutro Abs: 13.2 10*3/uL — ABNORMAL HIGH (ref 1.7–7.7)
PLATELETS: 185 10*3/uL (ref 150–400)
RBC: 3.78 MIL/uL — ABNORMAL LOW (ref 4.22–5.81)
RDW: 18.2 % — ABNORMAL HIGH (ref 11.5–15.5)
WBC: 15 10*3/uL — AB (ref 4.0–10.5)

## 2014-11-05 LAB — COMPREHENSIVE METABOLIC PANEL
ALK PHOS: 114 U/L (ref 38–126)
ALT: 94 U/L — ABNORMAL HIGH (ref 17–63)
AST: 111 U/L — ABNORMAL HIGH (ref 15–41)
Albumin: 3.1 g/dL — ABNORMAL LOW (ref 3.5–5.0)
Anion gap: 15 (ref 5–15)
BUN: 62 mg/dL — AB (ref 6–20)
CALCIUM: 7.8 mg/dL — AB (ref 8.9–10.3)
CHLORIDE: 92 mmol/L — AB (ref 101–111)
CO2: 27 mmol/L (ref 22–32)
Creatinine, Ser: 5.82 mg/dL — ABNORMAL HIGH (ref 0.61–1.24)
GFR calc non Af Amer: 10 mL/min — ABNORMAL LOW (ref >60)
GFR, EST AFRICAN AMERICAN: 11 mL/min — AB (ref >60)
Glucose, Bld: 431 mg/dL — ABNORMAL HIGH (ref 65–99)
Potassium: 4.8 mmol/L (ref 3.5–5.1)
Sodium: 134 mmol/L — ABNORMAL LOW (ref 135–145)
Total Bilirubin: 1.7 mg/dL — ABNORMAL HIGH (ref 0.3–1.2)
Total Protein: 5.9 g/dL — ABNORMAL LOW (ref 6.5–8.1)

## 2014-11-05 LAB — TROPONIN I: Troponin I: 0.07 ng/mL — ABNORMAL HIGH (ref <0.031)

## 2014-11-05 LAB — BRAIN NATRIURETIC PEPTIDE: B Natriuretic Peptide: 3626.3 pg/mL — ABNORMAL HIGH (ref 0.0–100.0)

## 2014-11-05 LAB — PROTIME-INR
INR: >10 (ref 0.00–1.49)
Prothrombin Time: 80.3 seconds — ABNORMAL HIGH (ref 11.6–15.2)

## 2014-11-05 MED ORDER — PIPERACILLIN-TAZOBACTAM IN DEX 2-0.25 GM/50ML IV SOLN
2.2500 g | Freq: Three times a day (TID) | INTRAVENOUS | Status: DC
  Administered 2014-11-06: 2.25 g via INTRAVENOUS
  Filled 2014-11-05 (×3): qty 50

## 2014-11-05 MED ORDER — PHYTONADIONE 5 MG PO TABS
5.0000 mg | ORAL_TABLET | Freq: Once | ORAL | Status: AC
  Administered 2014-11-06: 5 mg via ORAL
  Filled 2014-11-05: qty 1

## 2014-11-05 MED ORDER — PIPERACILLIN-TAZOBACTAM 3.375 G IVPB 30 MIN
3.3750 g | Freq: Once | INTRAVENOUS | Status: AC
  Administered 2014-11-05: 3.375 g via INTRAVENOUS
  Filled 2014-11-05: qty 50

## 2014-11-05 MED ORDER — METOCLOPRAMIDE HCL 5 MG/ML IJ SOLN: 5.0000 mg | Freq: Once | INTRAMUSCULAR | Status: DC

## 2014-11-05 MED ORDER — VANCOMYCIN HCL IN DEXTROSE 1-5 GM/200ML-% IV SOLN: 1000.0000 mg | INTRAVENOUS | Status: DC

## 2014-11-05 MED ORDER — VANCOMYCIN HCL 10 G IV SOLR
2000.0000 mg | Freq: Once | INTRAVENOUS | Status: AC
  Administered 2014-11-06: 2000 mg via INTRAVENOUS
  Filled 2014-11-05: qty 2000

## 2014-11-05 MED ORDER — DIPHENHYDRAMINE HCL 50 MG/ML IJ SOLN: 25.0000 mg | Freq: Once | INTRAMUSCULAR | Status: DC

## 2014-11-05 NOTE — ED Notes (Signed)
Patient presents stating has had diarrhea for "quite some time now" , feeling more SOB (was on oxygen but was doing better so they took him off of it), bilateral leg and abd swelling  Also c/o itch to back

## 2014-11-05 NOTE — Progress Notes (Signed)
ANTIBIOTIC CONSULT NOTE - INITIAL  Pharmacy Consult for Vancomycin and Zosyn Indication: rule out pneumonia  Allergies  Allergen Reactions  . Dilaudid [Hydromorphone] Nausea And Vomiting    Patient Measurements:   Adjusted Body Weight:   Vital Signs: Temp: 97.6 F (36.4 C) (07/18 2045) BP: 115/77 mmHg (07/18 2108) Pulse Rate: 72 (07/18 2108) Intake/Output from previous day:   Intake/Output from this shift:    Labs:  Recent Labs  11/05/14 2052  WBC 15.0*  HGB 11.7*  PLT 185  CREATININE 5.82*   Estimated Creatinine Clearance: 15 mL/min (by C-G formula based on Cr of 5.82). No results for input(s): VANCOTROUGH, VANCOPEAK, VANCORANDOM, GENTTROUGH, GENTPEAK, GENTRANDOM, TOBRATROUGH, TOBRAPEAK, TOBRARND, AMIKACINPEAK, AMIKACINTROU, AMIKACIN in the last 72 hours.   Microbiology: No results found for this or any previous visit (from the past 720 hour(s)).  Medical History: Past Medical History  Diagnosis Date  . Atrial fibrillation     a. 11/18/2011 s/p DCCV - 150J;  b. Anticoagulation w/ Apixaban;  c. 11/2011 back in afib->asymptomatic.  . H/O alcohol abuse     a. drinks heavily on the weekends.  . Hypertension   . Diabetes mellitus   . Heart murmur     a. 09/2011 Echo: EF 55-60%, Triv AI, Mild MR, mildly dil LA.  . Diabetic peripheral neuropathy   . CKD (chronic kidney disease), stage III   . CHF (congestive heart failure)   . Pneumonia     Medications:   (Not in a hospital admission) Scheduled:  Infusions:   Assessment: 59yo male with history of ESRD on HD MWF presents with SOB, diarrhea and leg swelling. Pharmacy is consulted to dose vancomycin and zosyn for suspected HCAP. Pt is afebrile, WBC 15, sCr 5.8.  Pt underwent HD today.  Goal of Therapy:  Vancomycin target pre-HD level 15-25 mcg/ml  Plan:  Vancomycin 2g IV once followed by 1g qHD Zosyn 3.375g IV once followed by 2.25g q8h Expected duration 7 days with resolution of temperature and/or  normalization of WBC Measure antibiotic drug levels at steady state Follow up culture results, renal function, and clinical course  Andrey Cota. Diona Foley, PharmD Clinical Pharmacist Pager [phone] 11/05/2014,10:18 PM

## 2014-11-05 NOTE — ED Notes (Signed)
Patient C/O itching and having bumps on his back.  Onset was 4 to 5 weeks ago. C/o diarrhea beginning  At the same time.  States that he has seen his doctors about the itching and has been given prescriptions.  He states that nothiing has helped.

## 2014-11-05 NOTE — ED Provider Notes (Signed)
CSN: [id number]     Arrival date & time 11/05/14  2006 History   First MD Initiated Contact with Patient 11/05/14 2049     Chief Complaint  Patient presents with  . Diarrhea  . Shortness of Breath  . Leg Swelling     (Consider location/radiation/quality/duration/timing/severity/associated sxs/prior Treatment) HPI  59 year old male with a history of CK D on dialysis as well as CHF presents with diarrhea 1 month. Patient states that he presented today specifically because he is now feeling weak and fatigued. This is been ongoing for several days. Patient does endorse shortness of breath, used to be on oxygen but had this taken off in February because he is doing much better. His legs have been swelling as well as his abdomen. He last got dialysis today which was typical for him. He states that they wait him after dialysis and he is at his typical dry weight. Denies any chest pain or cough. Does have trouble laying back due to dyspnea. Left leg is more swollen than the right. Patient has also been having itching for several weeks with bumps on his back. Nothing seems to help the itching.   Past Medical History  Diagnosis Date  . Atrial fibrillation     a. 11/18/2011 s/p DCCV - 150J;  b. Anticoagulation w/ Apixaban;  c. 11/2011 back in afib->asymptomatic.  . H/O alcohol abuse     a. drinks heavily on the weekends.  . Hypertension   . Diabetes mellitus   . Heart murmur     a. 09/2011 Echo: EF 55-60%, Triv AI, Mild MR, mildly dil LA.  . Diabetic peripheral neuropathy   . CKD (chronic kidney disease), stage III   . CHF (congestive heart failure)   . Pneumonia    Past Surgical History  Procedure Laterality Date  . Eye sx  11/11/2011    left eye  . Cardioversion  11/18/2011    Procedure: CARDIOVERSION;  Surgeon: Josue Hector, MD;  Location: [facility] ENDOSCOPY;  Service: Cardiovascular;  Laterality: N/A;  . Colonoscopy w/ biopsies and polypectomy    . Av fistula placement Left 02/13/2014   Procedure: INSERTION OF ARTERIOVENOUS (AV) GORE-TEX GRAFT ARM;  Surgeon: Angelia Mould, MD;  Location: [place];  Service: Vascular;  Laterality: Left;  . Right heart catheterization N/A 10/11/2013    Procedure: RIGHT HEART CATH;  Surgeon: Larey Dresser, MD;  Location: [facility] CATH LAB;  Service: Cardiovascular;  Laterality: N/A;   Family History  Problem Relation Age of Onset  . Heart disease Mother     before age 56  . Diabetes Father   . Diabetes Sister   . Peripheral vascular disease Sister     amputation  . Cancer Neg Hx   . Stroke Neg Hx    History  Substance Use Topics  . Smoking status: Former Smoker -- 0.01 packs/day for 5 years    Types: Cigarettes    Quit date: 04/20/1994  . Smokeless tobacco: Never Used     Comment: some in college  . Alcohol Use: No    Review of Systems  Constitutional: Negative for fever.  Respiratory: Positive for shortness of breath. Negative for cough.   Cardiovascular: Positive for leg swelling. Negative for chest pain.  Gastrointestinal: Positive for diarrhea and abdominal distention. Negative for abdominal pain.  Neurological: Positive for weakness.  All other systems reviewed and are negative.     Allergies  Dilaudid  Home Medications   Prior to Admission medications  Medication Sig Start Date End Date Taking? Authorizing Provider  atorvastatin (LIPITOR) 40 MG tablet Take 40 mg by mouth daily.    Historical Provider, MD  b complex-vitamin c-folic acid (NEPHRO-VITE) 0.8 MG TABS tablet Take 1 tablet by mouth daily.    Historical Provider, MD  colchicine 0.6 MG tablet TAKE 1 TABLET (0.6 MG TOTAL) BY MOUTH 2 (TWO) TIMES DAILY. 08/14/14   Jearld Fenton, NP  diazepam (VALIUM) 5 MG tablet Take 1 tablet (5 mg total) by mouth every 6 (six) hours as needed for sedation (itching). 10/28/14   Debby Freiberg, MD  diltiazem (CARDIZEM CD) 120 MG 24 hr capsule Take 1 capsule (120 mg total) by mouth daily. 03/03/14   Donne Hazel, MD  ethyl  chloride spray Apply 1 application topically every other day. Mon Wed Fri - done with dialysis 05/12/14   Historical Provider, MD  LANTUS SOLOSTAR 100 UNIT/ML injection Inject 50 Units into the skin daily. Sliding scale 09/19/11   Historical Provider, MD  metoprolol (LOPRESSOR) 100 MG tablet Take 1 tablet twice a day except dialysis days only take 1 tablet. 07/03/14   Amy Estrella Deeds, NP  ONETOUCH VERIO test strip  03/05/14   Historical Provider, MD  RENVELA 800 MG tablet Take 1,600 mg by mouth 2 (two) times daily.  05/16/14   Historical Provider, MD  sildenafil (VIAGRA) 100 MG tablet Take 100 mg by mouth daily as needed for erectile dysfunction.    Historical Provider, MD  warfarin (COUMADIN) 5 MG tablet Take 1 tablet (5 mg total) by mouth daily. Patient taking differently: Take 5 mg by mouth daily. 7.5 mg Sun and Tues. 5 mg all other days 03/03/14   Donne Hazel, MD   BP 115/77 mmHg  Pulse 72  Temp(Src) 97.6 F (36.4 C)  Resp 16  SpO2 98% Physical Exam  Constitutional: He is oriented to person, place, and time. He appears well-developed and well-nourished.  HENT:  Head: Normocephalic and atraumatic.  Right Ear: External ear normal.  Left Ear: External ear normal.  Nose: Nose normal.  Eyes: Right eye exhibits no discharge. Left eye exhibits no discharge.  Neck: Neck supple.  Cardiovascular: Normal rate, regular rhythm, normal heart sounds and intact distal pulses.   Pulmonary/Chest: Effort normal. He has decreased breath sounds in the right lower field.  Abdominal: Soft. There is no tenderness.  Musculoskeletal: He exhibits edema (L>R lower extremity pitting edema).  Neurological: He is alert and oriented to person, place, and time.  Skin: Skin is warm and dry.  Nursing note and vitals reviewed.   ED Course  Procedures (including critical care time) Labs Review Labs Reviewed  CBC WITH DIFFERENTIAL/PLATELET - Abnormal; Notable for the following:    WBC 15.0 (*)    RBC 3.78 (*)     Hemoglobin 11.7 (*)    HCT 35.6 (*)    RDW 18.2 (*)    Neutrophils Relative % 88 (*)    Neutro Abs 13.2 (*)    Lymphocytes Relative 4 (*)    Monocytes Absolute 1.2 (*)    All other components within normal limits  COMPREHENSIVE METABOLIC PANEL - Abnormal; Notable for the following:    Sodium 134 (*)    Chloride 92 (*)    Glucose, Bld 431 (*)    BUN 62 (*)    Creatinine, Ser 5.82 (*)    Calcium 7.8 (*)    Total Protein 5.9 (*)    Albumin 3.1 (*)    AST  111 (*)    ALT 94 (*)    Total Bilirubin 1.7 (*)    GFR calc non Af Amer 10 (*)    GFR calc Af Amer 11 (*)    All other components within normal limits  TROPONIN I - Abnormal; Notable for the following:    Troponin I 0.07 (*)    All other components within normal limits  PROTIME-INR - Abnormal; Notable for the following:    Prothrombin Time 80.3 (*)    INR >10.00 (*)    All other components within normal limits  BRAIN NATRIURETIC PEPTIDE - Abnormal; Notable for the following:    B Natriuretic Peptide 3626.3 (*)    All other components within normal limits  CULTURE, BLOOD (ROUTINE X 2)  CULTURE, BLOOD (ROUTINE X 2)    Imaging Review Dg Chest 2 View  11/05/2014   CLINICAL DATA:  Dyspnea for 1 day  EXAM: CHEST  2 VIEW  COMPARISON:  July 03, 2014  FINDINGS: The heart size and mediastinal contours are stable. There is mild patchy consolidation of lateral right lung base with small right pleural effusion. There is mild chronic central pulmonary vascular congestion. The visualized skeletal structures are stable.  IMPRESSION: Chronic central pulmonary vascular congestion.  Small right pleural effusion with associated mild patchy opacity of right lung base which could be due to atelectasis but superimposed pneumonia is not excluded.   Electronically Signed   By: Abelardo Diesel M.D.   On: 11/05/2014 21:54     EKG Interpretation   Date/Time:  Monday November 05 2014 21:06:51 EDT Ventricular Rate:  60 PR Interval:    QRS Duration: 84 QT  Interval:  437 QTC Calculation: 437 R Axis:   99 Text Interpretation:  Atrial fibrillation Borderline right axis deviation  Repol abnrm suggests ischemia, diffuse leads no significant change since  Nov 2015 Confirmed by Regenia Skeeter  MD, Aluna Whiston ([id number]) on 11/05/2014 9:19:22 PM      MDM   Final diagnoses:  HCAP (healthcare-associated pneumonia)  Supratherapeutic INR    Patient's shortness of breath appears to be due to a pleural effusion and opacity this consistent with pneumonia. Given he is on dialysis he is HCAP. He appears well here, no hypotension. Was initially mildly hypoxic in the high 80s. Patient has held a white blood cell count but his electrolytes and renal function are similar to prior. Likely has an element of acute heart failure as well. At this point will treat broadly with IV antibiotics and admit to medicine. His INR is >10 without any signs of bleeding, will treat with oral vitamin K and hold coumadin.    Sherwood Gambler, MD 11/05/14 [phone]

## 2014-11-06 ENCOUNTER — Inpatient Hospital Stay (HOSPITAL_COMMUNITY): Payer: 59

## 2014-11-06 ENCOUNTER — Encounter (HOSPITAL_COMMUNITY): Payer: Self-pay | Admitting: Nephrology

## 2014-11-06 DIAGNOSIS — I4891 Unspecified atrial fibrillation: Secondary | ICD-10-CM | POA: Diagnosis not present

## 2014-11-06 DIAGNOSIS — R7989 Other specified abnormal findings of blood chemistry: Secondary | ICD-10-CM | POA: Diagnosis not present

## 2014-11-06 DIAGNOSIS — E8770 Fluid overload, unspecified: Secondary | ICD-10-CM | POA: Diagnosis present

## 2014-11-06 DIAGNOSIS — R945 Abnormal results of liver function studies: Secondary | ICD-10-CM | POA: Diagnosis present

## 2014-11-06 DIAGNOSIS — R791 Abnormal coagulation profile: Secondary | ICD-10-CM | POA: Insufficient documentation

## 2014-11-06 DIAGNOSIS — R21 Rash and other nonspecific skin eruption: Secondary | ICD-10-CM

## 2014-11-06 DIAGNOSIS — A0472 Enterocolitis due to Clostridium difficile, not specified as recurrent: Secondary | ICD-10-CM | POA: Diagnosis present

## 2014-11-06 DIAGNOSIS — J189 Pneumonia, unspecified organism: Secondary | ICD-10-CM

## 2014-11-06 DIAGNOSIS — R0600 Dyspnea, unspecified: Secondary | ICD-10-CM | POA: Diagnosis not present

## 2014-11-06 DIAGNOSIS — I5033 Acute on chronic diastolic (congestive) heart failure: Secondary | ICD-10-CM | POA: Diagnosis not present

## 2014-11-06 DIAGNOSIS — Z992 Dependence on renal dialysis: Secondary | ICD-10-CM

## 2014-11-06 DIAGNOSIS — N186 End stage renal disease: Secondary | ICD-10-CM

## 2014-11-06 DIAGNOSIS — R0602 Shortness of breath: Secondary | ICD-10-CM | POA: Diagnosis not present

## 2014-11-06 LAB — PROTIME-INR
INR: 5.43 (ref 0.00–1.49)
PROTHROMBIN TIME: 47.8 s — AB (ref 11.6–15.2)

## 2014-11-06 LAB — CBC
HCT: 37.9 % — ABNORMAL LOW (ref 39.0–52.0)
Hemoglobin: 12.5 g/dL — ABNORMAL LOW (ref 13.0–17.0)
MCH: 31.9 pg (ref 26.0–34.0)
MCHC: 33 g/dL (ref 30.0–36.0)
MCV: 96.7 fL (ref 78.0–100.0)
Platelets: 176 10*3/uL (ref 150–400)
RBC: 3.92 MIL/uL — ABNORMAL LOW (ref 4.22–5.81)
RDW: 18.6 % — ABNORMAL HIGH (ref 11.5–15.5)
WBC: 14.4 10*3/uL — ABNORMAL HIGH (ref 4.0–10.5)

## 2014-11-06 LAB — COMPREHENSIVE METABOLIC PANEL
ALBUMIN: 3.1 g/dL — AB (ref 3.5–5.0)
ALT: 96 U/L — ABNORMAL HIGH (ref 17–63)
ANION GAP: 14 (ref 5–15)
AST: 107 U/L — ABNORMAL HIGH (ref 15–41)
Alkaline Phosphatase: 112 U/L (ref 38–126)
BILIRUBIN TOTAL: 1.8 mg/dL — AB (ref 0.3–1.2)
BUN: 70 mg/dL — AB (ref 6–20)
CALCIUM: 7.8 mg/dL — AB (ref 8.9–10.3)
CO2: 27 mmol/L (ref 22–32)
Chloride: 90 mmol/L — ABNORMAL LOW (ref 101–111)
Creatinine, Ser: 6.14 mg/dL — ABNORMAL HIGH (ref 0.61–1.24)
GFR, EST AFRICAN AMERICAN: 10 mL/min — AB (ref >60)
GFR, EST NON AFRICAN AMERICAN: 9 mL/min — AB (ref >60)
GLUCOSE: 427 mg/dL — AB (ref 65–99)
POTASSIUM: 4.3 mmol/L (ref 3.5–5.1)
SODIUM: 131 mmol/L — AB (ref 135–145)
Total Protein: 6 g/dL — ABNORMAL LOW (ref 6.5–8.1)

## 2014-11-06 LAB — GLUCOSE, CAPILLARY
GLUCOSE-CAPILLARY: 337 mg/dL — AB (ref 65–99)
GLUCOSE-CAPILLARY: 226 mg/dL — AB (ref 65–99)
Glucose-Capillary: 411 mg/dL — ABNORMAL HIGH (ref 65–99)
Glucose-Capillary: 110 mg/dL — ABNORMAL HIGH (ref 65–99)
Glucose-Capillary: 133 mg/dL — ABNORMAL HIGH (ref 65–99)

## 2014-11-06 LAB — CBG MONITORING, ED: GLUCOSE-CAPILLARY: 387 mg/dL — AB (ref 65–99)

## 2014-11-06 LAB — CLOSTRIDIUM DIFFICILE BY PCR: Toxigenic C. Difficile by PCR: POSITIVE — AB

## 2014-11-06 MED ORDER — LEVOCETIRIZINE DIHYDROCHLORIDE 5 MG PO TABS: 5.0000 mg | ORAL_TABLET | Freq: Every evening | ORAL | Status: DC

## 2014-11-06 MED ORDER — INSULIN GLARGINE 100 UNIT/ML ~~LOC~~ SOLN
20.0000 [IU] | Freq: Once | SUBCUTANEOUS | Status: AC
  Administered 2014-11-06: 20 [IU] via SUBCUTANEOUS
  Filled 2014-11-06: qty 0.2

## 2014-11-06 MED ORDER — SIMETHICONE 80 MG PO CHEW
160.0000 mg | CHEWABLE_TABLET | Freq: Four times a day (QID) | ORAL | Status: DC | PRN
  Administered 2014-11-06: 160 mg via ORAL
  Filled 2014-11-06 (×2): qty 2

## 2014-11-06 MED ORDER — RENA-VITE PO TABS
1.0000 | ORAL_TABLET | Freq: Every day | ORAL | Status: DC
  Administered 2014-11-06 – 2014-11-08 (×3): 1 via ORAL
  Filled 2014-11-06 (×4): qty 1

## 2014-11-06 MED ORDER — VANCOMYCIN 50 MG/ML ORAL SOLUTION
125.0000 mg | Freq: Four times a day (QID) | ORAL | Status: DC
  Administered 2014-11-06 – 2014-11-09 (×6): 125 mg via ORAL
  Filled 2014-11-06 (×15): qty 2.5

## 2014-11-06 MED ORDER — SODIUM CHLORIDE 0.9 % IJ SOLN
3.0000 mL | Freq: Two times a day (BID) | INTRAMUSCULAR | Status: DC
  Administered 2014-11-06 – 2014-11-09 (×6): 3 mL via INTRAVENOUS

## 2014-11-06 MED ORDER — SACCHAROMYCES BOULARDII 250 MG PO CAPS
250.0000 mg | ORAL_CAPSULE | Freq: Two times a day (BID) | ORAL | Status: DC
  Administered 2014-11-06 – 2014-11-09 (×6): 250 mg via ORAL
  Filled 2014-11-06 (×8): qty 1

## 2014-11-06 MED ORDER — INSULIN ASPART 100 UNIT/ML ~~LOC~~ SOLN
0.0000 [IU] | Freq: Three times a day (TID) | SUBCUTANEOUS | Status: DC
  Administered 2014-11-06 – 2014-11-07 (×2): 7 [IU] via SUBCUTANEOUS
  Administered 2014-11-07: 1 [IU] via SUBCUTANEOUS
  Administered 2014-11-08: 9 [IU] via SUBCUTANEOUS
  Administered 2014-11-08: 3 [IU] via SUBCUTANEOUS
  Administered 2014-11-08: 2 [IU] via SUBCUTANEOUS
  Administered 2014-11-09: 3 [IU] via SUBCUTANEOUS

## 2014-11-06 MED ORDER — PREDNISONE 20 MG PO TABS
30.0000 mg | ORAL_TABLET | Freq: Every day | ORAL | Status: DC
  Administered 2014-11-06 – 2014-11-08 (×3): 30 mg via ORAL
  Filled 2014-11-06 (×4): qty 1

## 2014-11-06 MED ORDER — ACETAMINOPHEN 650 MG RE SUPP: 650.0000 mg | Freq: Four times a day (QID) | RECTAL | Status: DC | PRN

## 2014-11-06 MED ORDER — HEPARIN SODIUM (PORCINE) 1000 UNIT/ML DIALYSIS
6100.0000 [IU] | Freq: Once | INTRAMUSCULAR | Status: AC
  Administered 2014-11-06: 6100 [IU] via INTRAVENOUS_CENTRAL
  Filled 2014-11-06: qty 7

## 2014-11-06 MED ORDER — DILTIAZEM HCL ER COATED BEADS 120 MG PO CP24
120.0000 mg | ORAL_CAPSULE | Freq: Every day | ORAL | Status: DC
  Administered 2014-11-06 – 2014-11-09 (×4): 120 mg via ORAL
  Filled 2014-11-06 (×4): qty 1

## 2014-11-06 MED ORDER — CLOBETASOL PROPIONATE 0.05 % EX CREA
1.0000 "application " | TOPICAL_CREAM | Freq: Two times a day (BID) | CUTANEOUS | Status: DC
  Administered 2014-11-06 – 2014-11-09 (×8): 1 via TOPICAL
  Filled 2014-11-06 (×4): qty 15

## 2014-11-06 MED ORDER — INSULIN GLARGINE 100 UNIT/ML SOLOSTAR PEN: 50.0000 [IU] | PEN_INJECTOR | Freq: Every day | SUBCUTANEOUS | Status: DC

## 2014-11-06 MED ORDER — VITAMIN K1 10 MG/ML IJ SOLN
1.0000 mg | Freq: Once | INTRAVENOUS | Status: AC
  Administered 2014-11-06: 1 mg via INTRAVENOUS
  Filled 2014-11-06: qty 0.1

## 2014-11-06 MED ORDER — LORATADINE 10 MG PO TABS
10.0000 mg | ORAL_TABLET | Freq: Every evening | ORAL | Status: DC
  Administered 2014-11-07 – 2014-11-08 (×2): 10 mg via ORAL
  Filled 2014-11-06 (×4): qty 1

## 2014-11-06 MED ORDER — INSULIN ASPART 100 UNIT/ML ~~LOC~~ SOLN
0.0000 [IU] | Freq: Every day | SUBCUTANEOUS | Status: DC
  Administered 2014-11-07 – 2014-11-08 (×2): 5 [IU] via SUBCUTANEOUS

## 2014-11-06 MED ORDER — NEPRO/CARBSTEADY PO LIQD
237.0000 mL | Freq: Two times a day (BID) | ORAL | Status: DC
  Administered 2014-11-07 – 2014-11-09 (×3): 237 mL via ORAL
  Filled 2014-11-06 (×9): qty 237

## 2014-11-06 MED ORDER — SODIUM CHLORIDE 0.9 % IJ SOLN
3.0000 mL | Freq: Two times a day (BID) | INTRAMUSCULAR | Status: DC
  Administered 2014-11-06 – 2014-11-09 (×3): 3 mL via INTRAVENOUS

## 2014-11-06 MED ORDER — METOPROLOL TARTRATE 50 MG PO TABS
50.0000 mg | ORAL_TABLET | Freq: Two times a day (BID) | ORAL | Status: DC
  Administered 2014-11-06 – 2014-11-07 (×3): 50 mg via ORAL
  Filled 2014-11-06 (×4): qty 1

## 2014-11-06 MED ORDER — LIDOCAINE HCL (PF) 1 % IJ SOLN: 5.0000 mL | INTRAMUSCULAR | Status: DC | PRN

## 2014-11-06 MED ORDER — NEPRO/CARBSTEADY PO LIQD
237.0000 mL | ORAL | Status: DC | PRN
  Filled 2014-11-06: qty 237

## 2014-11-06 MED ORDER — BISACODYL 5 MG PO TBEC: 5.0000 mg | DELAYED_RELEASE_TABLET | Freq: Every day | ORAL | Status: DC | PRN

## 2014-11-06 MED ORDER — ACETAMINOPHEN 325 MG PO TABS
650.0000 mg | ORAL_TABLET | Freq: Four times a day (QID) | ORAL | Status: DC | PRN
  Administered 2014-11-07: 650 mg via ORAL
  Filled 2014-11-06: qty 2

## 2014-11-06 MED ORDER — INSULIN GLARGINE 100 UNIT/ML ~~LOC~~ SOLN
50.0000 [IU] | Freq: Every day | SUBCUTANEOUS | Status: DC
  Administered 2014-11-06 – 2014-11-09 (×4): 50 [IU] via SUBCUTANEOUS
  Filled 2014-11-06 (×5): qty 0.5

## 2014-11-06 MED ORDER — SODIUM CHLORIDE 0.9 % IV SOLN: 250.0000 mL | INTRAVENOUS | Status: DC | PRN

## 2014-11-06 MED ORDER — LIDOCAINE-PRILOCAINE 2.5-2.5 % EX CREA
1.0000 "application " | TOPICAL_CREAM | CUTANEOUS | Status: DC | PRN
  Filled 2014-11-06: qty 5

## 2014-11-06 MED ORDER — SODIUM CHLORIDE 0.9 % IV SOLN: 100.0000 mL | INTRAVENOUS | Status: DC | PRN

## 2014-11-06 MED ORDER — ONDANSETRON HCL 4 MG/2ML IJ SOLN: 4.0000 mg | Freq: Four times a day (QID) | INTRAMUSCULAR | Status: DC | PRN

## 2014-11-06 MED ORDER — PENTAFLUOROPROP-TETRAFLUOROETH EX AERO: 1.0000 "application " | INHALATION_SPRAY | CUTANEOUS | Status: DC | PRN

## 2014-11-06 MED ORDER — HEPARIN SODIUM (PORCINE) 1000 UNIT/ML DIALYSIS
1000.0000 [IU] | INTRAMUSCULAR | Status: DC | PRN
  Filled 2014-11-06: qty 1

## 2014-11-06 MED ORDER — POLYETHYLENE GLYCOL 3350 17 G PO PACK
17.0000 g | PACK | Freq: Every day | ORAL | Status: DC | PRN
  Filled 2014-11-06: qty 1

## 2014-11-06 MED ORDER — SODIUM CHLORIDE 0.9 % IJ SOLN: 3.0000 mL | INTRAMUSCULAR | Status: DC | PRN

## 2014-11-06 MED ORDER — ONDANSETRON HCL 4 MG PO TABS: 4.0000 mg | ORAL_TABLET | Freq: Four times a day (QID) | ORAL | Status: DC | PRN

## 2014-11-06 MED ORDER — ALTEPLASE 2 MG IJ SOLR
2.0000 mg | Freq: Once | INTRAMUSCULAR | Status: DC | PRN
  Filled 2014-11-06: qty 2

## 2014-11-06 MED ORDER — CETYLPYRIDINIUM CHLORIDE 0.05 % MT LIQD
7.0000 mL | Freq: Two times a day (BID) | OROMUCOSAL | Status: DC
  Administered 2014-11-06 – 2014-11-09 (×6): 7 mL via OROMUCOSAL

## 2014-11-06 MED ORDER — DIPHENHYDRAMINE HCL 25 MG PO CAPS
25.0000 mg | ORAL_CAPSULE | ORAL | Status: DC | PRN
  Administered 2014-11-06 (×2): 25 mg via ORAL
  Administered 2014-11-07 – 2014-11-08 (×3): 50 mg via ORAL
  Filled 2014-11-06 (×2): qty 2
  Filled 2014-11-06 (×2): qty 1
  Filled 2014-11-06: qty 2

## 2014-11-06 MED ORDER — PNEUMOCOCCAL VAC POLYVALENT 25 MCG/0.5ML IJ INJ
0.5000 mL | INJECTION | INTRAMUSCULAR | Status: AC
  Administered 2014-11-07: 0.5 mL via INTRAMUSCULAR
  Filled 2014-11-06: qty 0.5

## 2014-11-06 MED ORDER — INSULIN ASPART 100 UNIT/ML ~~LOC~~ SOLN
8.0000 [IU] | Freq: Once | SUBCUTANEOUS | Status: AC
  Administered 2014-11-06: 8 [IU] via SUBCUTANEOUS

## 2014-11-06 MED ORDER — ATORVASTATIN CALCIUM 40 MG PO TABS
40.0000 mg | ORAL_TABLET | Freq: Every day | ORAL | Status: DC
  Administered 2014-11-06 – 2014-11-08 (×3): 40 mg via ORAL
  Filled 2014-11-06 (×4): qty 1

## 2014-11-06 MED ORDER — COLCHICINE 0.6 MG PO TABS
0.6000 mg | ORAL_TABLET | Freq: Every day | ORAL | Status: DC
  Administered 2014-11-06 – 2014-11-09 (×4): 0.6 mg via ORAL
  Filled 2014-11-06 (×4): qty 1

## 2014-11-06 NOTE — H&P (Signed)
[facility] History and Physical  [name] SWF:[id number] DOB: 06/19/1955 DOA: 11/05/2014  Referring physician: Dr. Regenia Skeeter PCP: Webb Silversmith, NP   Chief Complaint: SOB, leg swelling, esrd pt  HPI: Joseph [name] is a 59 y.o. male with hx of HTN, DM2, ESRD on HD MWF since Nov 2015, esrd due to DM.  Presenting to ED with SOB, abd swelling, bilat LE swelling for 1 week. Also has pruritic rash on back has seen derm MD for and is on prednisone for this. Wife says poor appetite for a while. No cough, no fevers, no chills, no CP.  +some orthopnea. No PND.    NO abd pain, n/v. +diarrhea for about a month off and on.  No joint pain.     Chart review: 5/11 - psych admission 11/15 - acute/ chron diast HF, DM/ CKD, HTN, OSA, acute resp failure > admitted with vol excess/ pulm edema, started on dialysis. Right HF by echo, LV EF 50-55%.  Pulm HTN with O2 dependence, chronic afib. Gout flare rx w colchicine.    Where does patient live at home Can patient participate in ADLs? yes  Past Medical History  Past Medical History  Diagnosis Date  . Atrial fibrillation     a. 11/18/2011 s/p DCCV - 150J;  b. Anticoagulation w/ Apixaban;  c. 11/2011 back in afib->asymptomatic.  . H/O alcohol abuse     a. drinks heavily on the weekends.  . Hypertension   . Diabetes mellitus   . Heart murmur     a. 09/2011 Echo: EF 55-60%, Triv AI, Mild MR, mildly dil LA.  . Diabetic peripheral neuropathy   . CKD (chronic kidney disease), stage III   . CHF (congestive heart failure)   . Pneumonia    Past Surgical History  Past Surgical History  Procedure Laterality Date  . Eye sx  11/11/2011    left eye  . Cardioversion  11/18/2011    Procedure: CARDIOVERSION;  Surgeon: Josue Hector, MD;  Location: [facility] ENDOSCOPY;  Service: Cardiovascular;  Laterality: N/A;  . Colonoscopy w/ biopsies and polypectomy    . Av fistula placement Left 02/13/2014    Procedure: INSERTION OF ARTERIOVENOUS (AV) GORE-TEX GRAFT ARM;   Surgeon: Angelia Mould, MD;  Location: [place];  Service: Vascular;  Laterality: Left;  . Right heart catheterization N/A 10/11/2013    Procedure: RIGHT HEART CATH;  Surgeon: Larey Dresser, MD;  Location: [facility] CATH LAB;  Service: Cardiovascular;  Laterality: N/A;   Family History  Family History  Problem Relation Age of Onset  . Heart disease Mother     before age 46  . Diabetes Father   . Diabetes Sister   . Peripheral vascular disease Sister     amputation  . Cancer Neg Hx   . Stroke Neg Hx     Social History  reports that he quit smoking about 20 years ago. His smoking use included Cigarettes. He has a .05 pack-year smoking history. He has never used smokeless tobacco. He reports that he does not drink alcohol or use illicit drugs. Allergies  Allergies  Allergen Reactions  . Dilaudid [Hydromorphone] Nausea And Vomiting   Home medications Prior to Admission medications   Medication Sig Start Date End Date Taking? Authorizing Provider  atorvastatin (LIPITOR) 40 MG tablet Take 40 mg by mouth daily.   Yes Historical Provider, MD  b complex-vitamin c-folic acid (NEPHRO-VITE) 0.8 MG TABS tablet Take 1 tablet by mouth daily.   Yes Historical  Provider, MD  cephALEXin (KEFLEX) 500 MG capsule Take 500 mg by mouth 2 (two) times daily. 11/01/14  Yes Historical Provider, MD  clobetasol cream (TEMOVATE) 9.38 % Apply 1 application topically 2 (two) times daily. 11/01/14  Yes Historical Provider, MD  colchicine 0.6 MG tablet TAKE 1 TABLET (0.6 MG TOTAL) BY MOUTH 2 (TWO) TIMES DAILY. 08/14/14  Yes Jearld Fenton, NP  diazepam (VALIUM) 5 MG tablet Take 1 tablet (5 mg total) by mouth every 6 (six) hours as needed for sedation (itching). 10/28/14  Yes Debby Freiberg, MD  diltiazem (CARDIZEM CD) 120 MG 24 hr capsule Take 1 capsule (120 mg total) by mouth daily. 03/03/14  Yes Donne Hazel, MD  LANTUS SOLOSTAR 100 UNIT/ML injection Inject 50 Units into the skin daily. Sliding scale 09/19/11  Yes  Historical Provider, MD  levocetirizine (XYZAL) 5 MG tablet Take 5 mg by mouth every evening.   Yes Historical Provider, MD  metoprolol (LOPRESSOR) 100 MG tablet Take 1 tablet twice a day except dialysis days only take 1 tablet. 07/03/14  Yes Amy D Clegg, NP  predniSONE (DELTASONE) 5 MG tablet Take 30 mg by mouth daily with breakfast.   Yes Historical Provider, MD  sildenafil (VIAGRA) 100 MG tablet Take 100 mg by mouth daily as needed for erectile dysfunction.   Yes Historical Provider, MD  warfarin (COUMADIN) 5 MG tablet Take 1 tablet (5 mg total) by mouth daily. Patient taking differently: Take 5 mg by mouth daily. 7.5 mg Sun and Tues. 5 mg all other days 03/03/14  Yes Donne Hazel, MD  ethyl chloride spray Apply 1 application topically every other day. Mon Wed Fri - done with dialysis 05/12/14   Historical Provider, MD  Glory Rosebush VERIO test strip  03/05/14   Historical Provider, MD   Liver Function Tests  Recent Labs Lab 11/05/14 2052  AST 111*  ALT 94*  ALKPHOS 114  BILITOT 1.7*  PROT 5.9*  ALBUMIN 3.1*   No results for input(s): LIPASE, AMYLASE in the last 168 hours. CBC  Recent Labs Lab 11/05/14 2052  WBC 15.0*  NEUTROABS 13.2*  HGB 11.7*  HCT 35.6*  MCV 94.2  PLT 101   Basic Metabolic Panel  Recent Labs Lab 11/05/14 2052  NA 134*  K 4.8  CL 92*  CO2 27  GLUCOSE 431*  BUN 62*  CREATININE 5.82*  CALCIUM 7.8*    CXR: independently reviewed > showed R effusion and patchy R basilar infiltrate vs atx, no gross edema  Filed Vitals:   11/05/14 2045 11/05/14 2108  BP: 101/80 115/77  Pulse: 77 72  Temp: 97.6 F (36.4 C)   Resp: 24 16  SpO2: 88% 98%   Exam: Chronically ill AAM , nasal O2, not in distress, looks uncomfortable No rash, cyanosis or gangrene Sclera anicteric, throat clear +JVD Chest faint rales R base, L clear Cor irreg rhythm, no MRG Abd distended, +ascites moderate, nontender, no HSM GU normal male Ext bilat LE edema below the knees L >  R, no cords or erythema L arm AVG patent +bruit Neuro is alert, nf, ox 3 Skin there is a papular rash peach-colored on the upper back  K 4.8 Na 134  Creat 5.8   CA 7.8   BS 431  Alb 3.1   AST 111/ ALT 94/ Tbili 1.7 WBC 15k  Hb 11.7   plt 185 BNP 3626 INR > 10.0   Assessment: 1. Dyspnea / Duncan Dull / vol overload - suspect fluid overload  is main issue, has ascites/ LE edema and likely losing body weight. Empiric abx started in case of PNA, will cont for now. Consult renal in am for extra HD and lowering of dry wt.  2. ESRD on HD MWF - had HD today, getting to his dry wt easily per pt 3. Afib on coumadin / Marland Kitchen INR 10- getting vit k (5 po, 1 mg IV), repeat am 4. DM uncontrolled - SSI + pt's long acting insulin 5. Rash - on pred, had biopsy done by local derm MD 6. HTN - dec'd MTP/dilt doses for now 7. Abnormal LFT's - unclear cause, follow 8. Diarrhea - 1 mo duration, check Cdif  Plan - admit, renal consult am,  may need extra HD, dec'd BP meds for now, correct INR. Cont pred for back rash unclear etiology.  F/u LFT's. Abx in case of PNA.    DVT Prophylaxis already over -anticoagulated  Code Status: full  Family Communication: wife in the room  Disposition Plan: home when stable    [place] D [facility] Pager [phone]  If 7PM-7AM, please contact night-coverage [email or web address] Password [email or web address] 11/06/2014, 12:00 AM

## 2014-11-06 NOTE — Progress Notes (Signed)
UR completed.    Deundra Furber W. Shelma Eiben, RN, BSN  Trauma/Neuro ICU Case Manager [phone] 

## 2014-11-06 NOTE — Progress Notes (Signed)
Pt HR elevated tonight 100-120, up to 140s nonsustained with frequent PVCs, pt asymptomatic. Cardizem 24hr was held today d/t dialysis. K Schorr notified and ordered to give cardizem now. Given to pt. Pt had 11 beat run vtach as well, asymptomatic, pt currently sleeping. Will continue to monitor. Ronnette Hila, RN

## 2014-11-06 NOTE — ED Notes (Signed)
Pt c/o itching and rash; requesting "his cream." Dr.Goldston made aware; order placed. Admitting at bedside

## 2014-11-06 NOTE — ED Notes (Signed)
CBG=[id number] 

## 2014-11-06 NOTE — Progress Notes (Signed)
Paged Dr. [place] Pink regarding critical INR result 5.43 called from lab. No new orders received. Will continue to monitor pt.      Verbal order with readback to hold 10 AM cardizem dose for HD today.   Maurene Capes RN

## 2014-11-06 NOTE — Procedures (Signed)
I was present at this dialysis session. I have reviewed the session itself and made appropriate changes.   Pearson Grippe  MD 11/06/2014, 3:42 PM

## 2014-11-06 NOTE — Progress Notes (Signed)
I witnessed and observed all medications administered by Rivka Safer, Student Nurse.

## 2014-11-06 NOTE — Consult Note (Signed)
Joseph [name] 11/06/2014 Rexene Agent Requesting Physician:  Jonnie Finner MD, Regenia Skeeter MD  Reason for Consult:  ESRD, SOB, Edema HPI:  48M ESRD MWF [place] via AVG  Pt adherent to outpt HD, attends full tx, good IDWG, achieves EDW each Tx.  Has had difficulty with rash, not only during/post HD.  Failed hydroxyzine/gabapentin.  Saw dermatology and had biopsy, but notes not sent to [facility].  Started on prednisone.  Not sure if itching has improved yet.  HD plan was to add second rinse to filter and see how he does.  Pt also has had diarrhea x42mo; has not mentioned it to me in HD unit.  C Diff positive here today.    Pertinent PMH includes chronic AFib on warfarin, chronic diastolic HF.    LFTs mildly increased here, transaminitis and inc bilirubin.  Not present before.  [place] abd ordered.  Prev heavy beer intake.  HBV SAg negative.   Filed Weights   11/06/14 0138  Weight: 88.179 kg (194 lb 6.4 oz)    I/O last 3 completed shifts: In: 130 [P.O.:80; IV Piggyback:50] Out: 0   ROS Balance of 12 systems is negative w/ exceptions as above  Outpt HD Orders Unit: [place] Days: MWF Time: 4h Dialyzer: F180 EDW: 87.5kg K/Ca: 2/2 Access: AVG Needle Size: 15g BFR/DFR: 450/800 UF Proflie: profile 2 VDRA: calcitriol 1.79mcg qTx EPO: none currently IV Fe: none currently Heparin: 6100 units qTx Most Recent Phos / PTH: 3.5 / 458 Most Recent TSAT / Ferritin: 21 / 919 Most Recent eKT/V: 1.21 Treatment Adherence: excellent  PMH  Past Medical History  Diagnosis Date  . Atrial fibrillation     a. 11/18/2011 s/p DCCV - 150J;  b. Anticoagulation w/ Apixaban;  c. 11/2011 back in afib->asymptomatic.  . H/O alcohol abuse     a. drinks heavily on the weekends.  . Hypertension   . Diabetes mellitus   . Heart murmur     a. 09/2011 Echo: EF 55-60%, Triv AI, Mild MR, mildly dil LA.  . Diabetic peripheral neuropathy   . CKD (chronic kidney disease), stage III   . CHF (congestive heart failure)   .  Pneumonia   . Sleep apnea    PSH  Past Surgical History  Procedure Laterality Date  . Eye sx  11/11/2011    left eye  . Cardioversion  11/18/2011    Procedure: CARDIOVERSION;  Surgeon: Josue Hector, MD;  Location: [facility] ENDOSCOPY;  Service: Cardiovascular;  Laterality: N/A;  . Colonoscopy w/ biopsies and polypectomy    . Av fistula placement Left 02/13/2014    Procedure: INSERTION OF ARTERIOVENOUS (AV) GORE-TEX GRAFT ARM;  Surgeon: Angelia Mould, MD;  Location: [facility];  Service: Vascular;  Laterality: Left;  . Right heart catheterization N/A 10/11/2013    Procedure: RIGHT HEART CATH;  Surgeon: Larey Dresser, MD;  Location: [facility] CATH LAB;  Service: Cardiovascular;  Laterality: N/A;   FH  Family History  Problem Relation Age of Onset  . Heart disease Mother     before age 19  . Diabetes Father   . Diabetes Sister   . Peripheral vascular disease Sister     amputation  . Cancer Neg Hx   . Stroke Neg Hx    SH  reports that he quit smoking about 20 years ago. His smoking use included Cigarettes. He has a .05 pack-year smoking history. He has never used smokeless tobacco. He reports that he does not drink alcohol or  use illicit drugs. Allergies  Allergies  Allergen Reactions  . Dilaudid [Hydromorphone] Nausea And Vomiting   Home medications Prior to Admission medications   Medication Sig Start Date End Date Taking? Authorizing Provider  atorvastatin (LIPITOR) 40 MG tablet Take 40 mg by mouth daily.   Yes Historical Provider, MD  b complex-vitamin c-folic acid (NEPHRO-VITE) 0.8 MG TABS tablet Take 1 tablet by mouth daily.   Yes Historical Provider, MD  cephALEXin (KEFLEX) 500 MG capsule Take 500 mg by mouth 2 (two) times daily. 11/01/14  Yes Historical Provider, MD  clobetasol cream (TEMOVATE) 4.40 % Apply 1 application topically 2 (two) times daily. 11/01/14  Yes Historical Provider, MD  colchicine 0.6 MG tablet TAKE 1 TABLET (0.6 MG TOTAL) BY MOUTH 2 (TWO) TIMES DAILY. 08/14/14   Yes Jearld Fenton, NP  diazepam (VALIUM) 5 MG tablet Take 1 tablet (5 mg total) by mouth every 6 (six) hours as needed for sedation (itching). 10/28/14  Yes Debby Freiberg, MD  diltiazem (CARDIZEM CD) 120 MG 24 hr capsule Take 1 capsule (120 mg total) by mouth daily. 03/03/14  Yes Donne Hazel, MD  LANTUS SOLOSTAR 100 UNIT/ML injection Inject 50 Units into the skin daily. Sliding scale 09/19/11  Yes Historical Provider, MD  levocetirizine (XYZAL) 5 MG tablet Take 5 mg by mouth every evening.   Yes Historical Provider, MD  metoprolol (LOPRESSOR) 100 MG tablet Take 1 tablet twice a day except dialysis days only take 1 tablet. 07/03/14  Yes Amy D Clegg, NP  predniSONE (DELTASONE) 5 MG tablet Take 30 mg by mouth daily with breakfast.   Yes Historical Provider, MD  sildenafil (VIAGRA) 100 MG tablet Take 100 mg by mouth daily as needed for erectile dysfunction.   Yes Historical Provider, MD  warfarin (COUMADIN) 5 MG tablet Take 1 tablet (5 mg total) by mouth daily. Patient taking differently: Take 5 mg by mouth daily. 7.5 mg Sun and Tues. 5 mg all other days 03/03/14  Yes Donne Hazel, MD  ethyl chloride spray Apply 1 application topically every other day. Mon Wed Fri - done with dialysis 05/12/14   Historical Provider, MD  Glory Rosebush VERIO test strip  03/05/14   Historical Provider, MD    Current Medications Scheduled Meds: . antiseptic oral rinse  7 mL Mouth Rinse BID  . atorvastatin  40 mg Oral q1800  . clobetasol cream  1 application Topical BID  . colchicine  0.6 mg Oral Daily  . diltiazem  120 mg Oral Daily  . insulin aspart  0-5 Units Subcutaneous QHS  . insulin aspart  0-9 Units Subcutaneous TID WC  . insulin glargine  50 Units Subcutaneous Daily  . loratadine  10 mg Oral QPM  . metoprolol  50 mg Oral BID  . multivitamin  1 tablet Oral QHS  . piperacillin-tazobactam (ZOSYN)  IV  2.25 g Intravenous Q8H  . [START ON 11/07/2014] pneumococcal 23 valent vaccine  0.5 mL Intramuscular  Tomorrow-1000  . predniSONE  30 mg Oral Q breakfast  . sodium chloride  3 mL Intravenous Q12H  . sodium chloride  3 mL Intravenous Q12H  . [START ON 11/07/2014] vancomycin  1,000 mg Intravenous Q M,W,F-HD   Continuous Infusions:  PRN Meds:.sodium chloride, acetaminophen **OR** acetaminophen, bisacodyl, diphenhydrAMINE, ondansetron **OR** ondansetron (ZOFRAN) IV, polyethylene glycol, simethicone, sodium chloride  CBC  Recent Labs Lab 11/05/14 2052 11/06/14 0315  WBC 15.0* 14.4*  NEUTROABS 13.2*  --   HGB 11.7* 12.5*  HCT 35.6* 37.9*  MCV 94.2 96.7  PLT 185 312   Basic Metabolic Panel  Recent Labs Lab 11/05/14 2052 11/06/14 0315  NA 134* 131*  K 4.8 4.3  CL 92* 90*  CO2 27 27  GLUCOSE 431* 427*  BUN 62* 70*  CREATININE 5.82* 6.14*  CALCIUM 7.8* 7.8*    Physical Exam  Blood pressure 124/76, pulse 90, temperature 98 F (36.7 C), temperature source Axillary, resp. rate 19, height 6' (1.829 m), weight 88.179 kg (194 lb 6.4 oz), SpO2 99 %. GEN: appears less well than previously ENT: NCAT, Caruthers on place EYES: EOMI CV: RRR PULM: CTAB ABD: mildly protuberant.   EXT:2-3+ Pitting LEE   A/P 1. ESRD:  1. HD today, aggressive UF 2. Cont on MWF schedule for now 3. AVG, use heparin 2. HTN/and hypervolemia: As per above, BP stable 3. Inc AST/ALT Tbili; ? Ascites 1. Hepatitis serologies pending 2. Abd [place] ordered 4. Diarrhea / C Diff: per [facility] 5. Anemia: Hb stable 6. MBD: historically doing well, phos is not cause of itching 7. Itching: rinse HD filter twice  Pearson Grippe MD 11/06/2014, 11:04 AM

## 2014-11-06 NOTE — Progress Notes (Signed)
Initial Nutrition Assessment  DOCUMENTATION CODES:   Severe malnutrition in context of chronic illness  INTERVENTION:   Nepro Shake po BID, each supplement provides 425 kcal and 19 grams protein   NUTRITION DIAGNOSIS:   Malnutrition related to chronic illness as evidenced by energy intake < or equal to 75% for > or equal to 1 month, severe depletion of muscle mass.   GOAL:   Patient will meet greater than or equal to 90% of their needs   MONITOR:   PO intake, Supplement acceptance, Skin, I & O's, Labs, Weight trends  REASON FOR ASSESSMENT:   Malnutrition Screening Tool    ASSESSMENT:   Pt with hx of HTN, DM2, ESRD on HD MWF since Nov 2015, esrd due to DM. Presenting to ED with SOB, abd swelling, bilat LE swelling for 1 week. Also has pruritic rash on back has seen derm MD for and is on prednisone for this.  Labs reviewed: c.diff positive (7/19), sodium low, BUN/Cr and glucose elevated Cbg's: 226-411 (on prednisone)  Medications reviewed and include: lantus, rena-vit, prednisone, florastor  Per wife pt has had diarrhea on and off for 1 month, his appetite has been poor x 1 1/2 months.  Breakfast: none Lunch: leftovers (small amount) Dinner: meat, starch, veggie (about 50% from what he usually eats) Pt has drank glucerna shake at home in the past but had stopped, wife unsure why but speculates potassium content Per wife pt's dry weight is 187 lb however she has noticed weight loss but fluid gain, suspect fluid accumulation has masked actual weight lost.   Diet Order:  Diet renal/carb modified with fluid restriction Diet-HS Snack?: Nothing; Room service appropriate?: Yes; Fluid consistency:: Thin; Fluid restriction:: 1200 mL Fluid  Pt consumed 100% this am.   Skin:  Wound (see comment) (rash on back)  Last BM:  7/19 - c.diff  Height:   Ht Readings from Last 1 Encounters:  11/06/14 6' (1.829 m)    Weight:   Wt Readings from Last 1 Encounters:  11/06/14 194 lb  0.1 oz (88 kg)    Ideal Body Weight:  80.9 kg  Wt Readings from Last 10 Encounters:  11/06/14 194 lb 0.1 oz (88 kg)  10/28/14 198 lb (89.812 kg)  10/20/14 198 lb (89.812 kg)  07/03/14 200 lb 12.8 oz (91.082 kg)  05/17/14 192 lb (87.091 kg)  03/27/14 184 lb (83.462 kg)  03/03/14 186 lb 11.2 oz (84.687 kg)  02/20/14 213 lb 8 oz (96.843 kg)  02/13/14 208 lb (94.348 kg)  01/31/14 208 lb (94.348 kg)    BMI:  Body mass index is 26.31 kg/(m^2).  Estimated Nutritional Needs:   Kcal:  2100-2300  Protein:  100-110 grams  Fluid:  >/= 1.2 L/day  EDUCATION NEEDS:   No education needs identified at this time  [place], [place], [place] Pager [phone] After Hours Pager

## 2014-11-06 NOTE — Progress Notes (Signed)
Pt arrived to floor in NAD, A/Ox4, standby assist. Pt and with oriented to room and floor and orders reviewed, pt placed on telemetry. Pt requesting benadryl for itching, CBG 411. NP on call notified.

## 2014-11-06 NOTE — ED Notes (Signed)
Attempted to call report

## 2014-11-06 NOTE — Progress Notes (Signed)
  Patient was admitted earlier this morning. H&P has been reviewed.  S: Patient presented with complaints of diarrhea, shortness of breath and leg swelling. Denies any pain at this time. Denies any cough or fevers.  O: Vital signs are stable. Lungs are clear to auscultation bilaterally S1, S2 is  normal regular. No S3, S4. No rubs, murmurs, or bruit. Abdomen is soft, nontender, nondistended. Bowel sounds are present. No masses or organomegaly 1-2+ pitting edema noted bilateral lower extremities. Alert and oriented 3. No focal neurological deficits.  A/P: Dyspnea is most likely secondary to fluid overload. This is consistent with edema in the lower extremities. He will be seen by nephrology today and possibly undergo dialysis. Chest x-ray reviewed. His symptoms and x-ray not particularly suggestive of pneumonia. We will stop broad spectrum antibiotics.  Diarrhea secondary to C. difficile: Initiate vancomycin.  End stage renal disease on dialysis Monday, Wednesday, Friday: Nephrology is following. He'll be dialyzed today.  History of atrial fibrillation on warfarin, with supratherapeutic INR: He was given vitamin K last night. INR has improved. No evidence of bleeding. We will allow it to decrease on its own.  History of uncontrolled diabetes: Continue with his current medications  History of skin rash: Followed by dermatology. Currently on prednisone. Would like to decrease dose considering now that he has C. Difficile.  History of essential hypertension: Continue to monitor blood pressures.  Abnormal LFTs: Could be due to hepatic congestion. Check ultrasound abdomen. Check hepatitis panel  We will continue to follow.  Estuardo Frisbee 11/06/2014 1:08 PM

## 2014-11-07 DIAGNOSIS — I481 Persistent atrial fibrillation: Secondary | ICD-10-CM | POA: Diagnosis not present

## 2014-11-07 DIAGNOSIS — A047 Enterocolitis due to Clostridium difficile: Secondary | ICD-10-CM

## 2014-11-07 DIAGNOSIS — I270 Primary pulmonary hypertension: Secondary | ICD-10-CM

## 2014-11-07 DIAGNOSIS — E8770 Fluid overload, unspecified: Secondary | ICD-10-CM

## 2014-11-07 DIAGNOSIS — I10 Essential (primary) hypertension: Secondary | ICD-10-CM | POA: Diagnosis not present

## 2014-11-07 DIAGNOSIS — R7989 Other specified abnormal findings of blood chemistry: Secondary | ICD-10-CM | POA: Diagnosis not present

## 2014-11-07 DIAGNOSIS — N186 End stage renal disease: Secondary | ICD-10-CM | POA: Diagnosis not present

## 2014-11-07 DIAGNOSIS — R0600 Dyspnea, unspecified: Secondary | ICD-10-CM | POA: Diagnosis not present

## 2014-11-07 LAB — COMPREHENSIVE METABOLIC PANEL
ALK PHOS: 143 U/L — AB (ref 38–126)
ALT: 109 U/L — ABNORMAL HIGH (ref 17–63)
AST: 124 U/L — ABNORMAL HIGH (ref 15–41)
Albumin: 3.5 g/dL (ref 3.5–5.0)
Anion gap: 16 — ABNORMAL HIGH (ref 5–15)
BILIRUBIN TOTAL: 2.3 mg/dL — AB (ref 0.3–1.2)
BUN: 52 mg/dL — ABNORMAL HIGH (ref 6–20)
CO2: 30 mmol/L (ref 22–32)
Calcium: 8.5 mg/dL — ABNORMAL LOW (ref 8.9–10.3)
Chloride: 92 mmol/L — ABNORMAL LOW (ref 101–111)
Creatinine, Ser: 5.29 mg/dL — ABNORMAL HIGH (ref 0.61–1.24)
GFR calc Af Amer: 12 mL/min — ABNORMAL LOW (ref >60)
GFR calc non Af Amer: 11 mL/min — ABNORMAL LOW (ref >60)
Glucose, Bld: 215 mg/dL — ABNORMAL HIGH (ref 65–99)
POTASSIUM: 4.1 mmol/L (ref 3.5–5.1)
Sodium: 138 mmol/L (ref 135–145)
TOTAL PROTEIN: 6.7 g/dL (ref 6.5–8.1)

## 2014-11-07 LAB — CBC
HEMATOCRIT: 42.6 % (ref 39.0–52.0)
Hemoglobin: 14.2 g/dL (ref 13.0–17.0)
MCH: 31.5 pg (ref 26.0–34.0)
MCHC: 33.3 g/dL (ref 30.0–36.0)
MCV: 94.5 fL (ref 78.0–100.0)
Platelets: 162 10*3/uL (ref 150–400)
RBC: 4.51 MIL/uL (ref 4.22–5.81)
RDW: 18.3 % — ABNORMAL HIGH (ref 11.5–15.5)
WBC: 13.2 10*3/uL — ABNORMAL HIGH (ref 4.0–10.5)

## 2014-11-07 LAB — HEPATITIS PANEL, ACUTE
HCV Ab: <0.1 s/co ratio (ref 0.0–0.9)
HEP A IGM: NEGATIVE
HEP B C IGM: NEGATIVE
Hepatitis B Surface Ag: NEGATIVE

## 2014-11-07 LAB — PROTIME-INR
INR: 2.41 — AB (ref 0.00–1.49)
Prothrombin Time: 25.9 seconds — ABNORMAL HIGH (ref 11.6–15.2)

## 2014-11-07 LAB — GLUCOSE, CAPILLARY
GLUCOSE-CAPILLARY: 378 mg/dL — AB (ref 65–99)
GLUCOSE-CAPILLARY: 144 mg/dL — AB (ref 65–99)
Glucose-Capillary: 350 mg/dL — ABNORMAL HIGH (ref 65–99)
Glucose-Capillary: 377 mg/dL — ABNORMAL HIGH (ref 65–99)
Glucose-Capillary: 437 mg/dL — ABNORMAL HIGH (ref 65–99)

## 2014-11-07 LAB — MAGNESIUM: Magnesium: 2.2 mg/dL (ref 1.7–2.4)

## 2014-11-07 MED ORDER — HEPARIN SODIUM (PORCINE) 1000 UNIT/ML DIALYSIS
100.0000 [IU]/kg | INTRAMUSCULAR | Status: DC | PRN
  Filled 2014-11-07: qty 9

## 2014-11-07 MED ORDER — METOPROLOL TARTRATE 100 MG PO TABS
100.0000 mg | ORAL_TABLET | Freq: Every day | ORAL | Status: DC
  Administered 2014-11-07 – 2014-11-08 (×2): 100 mg via ORAL
  Filled 2014-11-07 (×3): qty 1

## 2014-11-07 MED ORDER — METOPROLOL TARTRATE 50 MG PO TABS
50.0000 mg | ORAL_TABLET | Freq: Every day | ORAL | Status: DC
  Administered 2014-11-08 – 2014-11-09 (×2): 50 mg via ORAL
  Filled 2014-11-07 (×2): qty 1

## 2014-11-07 NOTE — Progress Notes (Signed)
[facility] PROGRESS NOTE  Joseph [name] VQM:[id number] DOB: 17-May-1955 DOA: 11/05/2014 PCP: Webb Silversmith, NP  Assessment/Plan: 1. Acute on chronic diastolic congestive heart failure -Patient with history of diastolic CHF, end-stage renal disease on hemodialysis presenting with clinical signs and symptoms suggestive of acute decompensated heart failure. -He underwent hemodialysis on 11/06/2014 at 11/07/2014 with subsequent improvement to symptoms. -He currently denies chest pain and reports improvement to dyspnea.  2.  Severe pulmonary hypertension -Transthoracic echocardiogram performed at [facility] on 08/21/2014 showing evidence of severe pulmonary hypertension. I discussed case with nephrology who recommended a cardiology consultation. Estimated right intraocular systolic pressure 99 mmHg, with right-sided heart failure likely leading to congestion hepatitis and significant fluid overload. -Cardiology was consulted  3. 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  -Lab work showing elevated transaminases with a.m. lab work showing an AST of 124 and ALT of 109, and total bilirubin of 2.3. -Suspect is related to congestion hepatitis  4.  End-stage renal disease on hemodialysis Mondays Wednesdays and Fridays -Nephrology consulted patient undergoing hemodialysis during this hospitalization  5.  History of atrial fibrillation -He initially presented with a supratherapeutic INR greater than 10 on 11/05/2014. INR trending down now within the therapeutic range at 2.41, pharmacy consulted for Coumadin management -Continue Cardizem 120 mg by mouth daily and metoprolol 50 mg by mouth daily  6.  Insulin dependent diabetes mellitus -Continue Lantus 50 units subcutaneous daily  7.  C. difficile colitis -Patient reporting recent antimicrobial use, reported having diarrhea -Stool coming back positive for C. difficile, continue oral vancomycin  Code Status: Full code Family  Communication:  Disposition Plan: Dysphagia discharge home when medically stable   Consultants:  Nephrology  Cardiology  Procedures:  Hemodialysis    HPI/Subjective: Patient is a pleasant 59 year old gentleman with a past medical history of hypertension, type 2 diabetes mellitus, end-stage renal disease who was admitted to the medicine service on 11/06/2014 when he presented with complaints of increasing shortness of breath associate with bilateral lower extremity edema. Symptoms were felt to be related to volume overload. Nephrology was consulted as he was seen and evaluated by Dr.Sanford. He underwent hemodialysis on 11/06/2014 and 11/07/2014, and subsequent improvement to symptoms.  Objective: Filed Vitals:   11/07/14 1614  BP: 138/86  Pulse: 90  Temp: 98 F (36.7 C)  Resp: 20    Intake/Output Summary (Last 24 hours) at 11/07/14 1711 Last data filed at 11/07/14 1614  Gross per 24 hour  Intake    440 ml  Output   6166 ml  Net  -5726 ml   Filed Weights   11/07/14 0542 11/07/14 1214 11/07/14 1614  Weight: 84.324 kg (185 lb 14.4 oz) 84.6 kg (186 lb 8.2 oz) 82 kg (180 lb 12.4 oz)    Exam:   General:  Patient is sitting at bedside chair, reports feeling better  Cardiovascular: Regular rate and rhythm normal D3-O6, 2/6 systolic ejection murmur  Respiratory: Normal respiratory effort, lungs are clear to auscultation bilaterally  Abdomen: Soft nontender nondistended positive bowel sounds  Musculoskeletal: No edema  Data Reviewed: Basic Metabolic Panel:  Recent Labs Lab 11/05/14 2052 11/06/14 0315 11/07/14 0021  NA 134* 131* 138  K 4.8 4.3 4.1  CL 92* 90* 92*  CO2 27 27 30   GLUCOSE 431* 427* 215*  BUN 62* 70* 52*  CREATININE 5.82* 6.14* 5.29*  CALCIUM 7.8* 7.8* 8.5*  MG  --   --  2.2   Liver Function Tests:  Recent Labs Lab 11/05/14 2052 11/06/14  0315 11/07/14 0021  AST 111* 107* 124*  ALT 94* 96* 109*  ALKPHOS 114 112 143*  BILITOT 1.7* 1.8*  2.3*  PROT 5.9* 6.0* 6.7  ALBUMIN 3.1* 3.1* 3.5   No results for input(s): LIPASE, AMYLASE in the last 168 hours. No results for input(s): AMMONIA in the last 168 hours. CBC:  Recent Labs Lab 11/05/14 2052 11/06/14 0315 11/07/14 0021  WBC 15.0* 14.4* 13.2*  NEUTROABS 13.2*  --   --   HGB 11.7* 12.5* 14.2  HCT 35.6* 37.9* 42.6  MCV 94.2 96.7 94.5  PLT 185 176 162   Cardiac Enzymes:  Recent Labs Lab 11/05/14 2205  TROPONINI 0.07*   BNP (last 3 results)  Recent Labs  11/05/14 2205  BNP 3626.3*    ProBNP (last 3 results)  Recent Labs  01/15/14 1221 02/20/14 1515 02/24/14 1145  PROBNP 9297.0* 15468.0* 10410.0*    CBG:  Recent Labs Lab 11/06/14 1932 11/06/14 2047 11/07/14 0548 11/07/14 1110 11/07/14 1200  GLUCAP 110* 133* 350* 378* 377*    Recent Results (from the past 240 hour(s))  Culture, blood (routine x 2)     Status: None (Preliminary result)   Collection Time: 11/05/14 10:15 PM  Result Value Ref Range Status   Specimen Description BLOOD HAND RIGHT  Final   Special Requests BOTTLES DRAWN AEROBIC AND ANAEROBIC 5CC  Final   Culture NO GROWTH 1 DAY  Final   Report Status PENDING  Incomplete  Culture, blood (routine x 2)     Status: None (Preliminary result)   Collection Time: 11/05/14 11:01 PM  Result Value Ref Range Status   Specimen Description BLOOD RIGHT FOREARM  Final   Special Requests BOTTLES DRAWN AEROBIC ONLY [place]  Final   Culture NO GROWTH 1 DAY  Final   Report Status PENDING  Incomplete  Clostridium Difficile by PCR (not at [facility])     Status: Abnormal   Collection Time: 11/06/14 11:28 AM  Result Value Ref Range Status   C difficile by pcr POSITIVE (A) NEGATIVE Final    Comment: CRITICAL RESULT CALLED TO, READ BACK BY AND VERIFIED WITH: Jaclyn Shaggy RN 12:35 11/06/14 ([email or web address])      Studies: Dg Chest 2 View  11/05/2014   CLINICAL DATA:  Dyspnea for 1 day  EXAM: CHEST  2 VIEW  COMPARISON:  July 03, 2014  FINDINGS: The heart size and  mediastinal contours are stable. There is mild patchy consolidation of lateral right lung base with small right pleural effusion. There is mild chronic central pulmonary vascular congestion. The visualized skeletal structures are stable.  IMPRESSION: Chronic central pulmonary vascular congestion.  Small right pleural effusion with associated mild patchy opacity of right lung base which could be due to atelectasis but superimposed pneumonia is not excluded.   Electronically Signed   By: Abelardo Diesel M.D.   On: 11/05/2014 21:54   US Abdomen Complete  11/06/2014   CLINICAL DATA:  Abnormal liver function tests  EXAM: ULTRASOUND ABDOMEN COMPLETE  COMPARISON:  None.  FINDINGS: Gallbladder: No gallstones or wall thickening visualized. No sonographic Murphy sign noted.  Common bile duct: Diameter: 4 mm  Liver: Minimally nodular contour with no focal abnormalities.  IVC: No abnormality visualized.  Pancreas: Visualized portion unremarkable.  Spleen: Size and appearance within normal limits.  Right Kidney: Length: 10.7 cm. Midpole cyst measuring 14 mm. Mildly increased echogenicity.  Left Kidney: Length: 10.8 cm. Lower pole cyst measures 1 cm. Mildly increased echogenicity.  Abdominal aorta: No aneurysm  identified. Limited visualization distally due to bowel gas.  Other findings: Right pleural effusion.  Small volume ascites.  IMPRESSION: Mildly nodular liver contour with small volume of ascites and right pleural effusion. Possibility of cirrhosis not excluded.  Evidence of medical renal disease.   Electronically Signed   By: Skipper Cliche M.D.   On: 11/06/2014 21:38    Scheduled Meds: . antiseptic oral rinse  7 mL Mouth Rinse BID  . atorvastatin  40 mg Oral q1800  . clobetasol cream  1 application Topical BID  . colchicine  0.6 mg Oral Daily  . diltiazem  120 mg Oral Daily  . feeding supplement (NEPRO CARB STEADY)  237 mL Oral BID BM  . insulin aspart  0-5 Units Subcutaneous QHS  . insulin aspart  0-9 Units  Subcutaneous TID WC  . insulin glargine  50 Units Subcutaneous Daily  . loratadine  10 mg Oral QPM  . metoprolol tartrate  100 mg Oral QHS  . [START ON 11/08/2014] metoprolol  50 mg Oral Daily  . multivitamin  1 tablet Oral QHS  . pneumococcal 23 valent vaccine  0.5 mL Intramuscular Tomorrow-1000  . predniSONE  30 mg Oral Q breakfast  . saccharomyces boulardii  250 mg Oral BID  . sodium chloride  3 mL Intravenous Q12H  . sodium chloride  3 mL Intravenous Q12H  . vancomycin  125 mg Oral QID   Continuous Infusions:   Principal Problem:   C. difficile diarrhea Active Problems:   Atrial fibrillation-permanent   Essential hypertension   Obstructive sleep apnea   Pulmonary hypertension   Diabetes mellitus, controlled   Rash   HCAP (healthcare-associated pneumonia)   Volume overload   ESRD on hemodialysis   Dyspnea   Abnormal LFTs   Supratherapeutic INR    Time spent: 35 min    Kelvin Cellar  [facility] Pager [phone]. If 7PM-7AM, please contact night-coverage at [email or web address], password [facility] 11/07/2014, 5:11 PM  LOS: 2 days

## 2014-11-07 NOTE — Progress Notes (Signed)
Admit: 11/05/2014 LOS: 2  19M ESRD MWF AVG admit with LE Edema, C Dif Colitis, abnormal LFTs, ? Ascites  Subjective:  HD yesterday,  3.9L UF, post weight 84.1kg Abd [place]: Mildly nodular liver contour with small volume of ascites and right pleural effusion. Possibility of cirrhosis not excluded.  Started PO Vanc for C difficile TTE from 04 and 08/2014 seen in notes: severe pulmHTN  07/19 0701 - 07/20 0700 In: 310 [P.O.:310] Out: 3902 [Urine:1; Stool:1]  Filed Weights   11/06/14 1345 11/06/14 1824 11/07/14 0542  Weight: 88 kg (194 lb 0.1 oz) 84.1 kg (185 lb 6.5 oz) 84.324 kg (185 lb 14.4 oz)    Scheduled Meds: . antiseptic oral rinse  7 mL Mouth Rinse BID  . atorvastatin  40 mg Oral q1800  . clobetasol cream  1 application Topical BID  . colchicine  0.6 mg Oral Daily  . diltiazem  120 mg Oral Daily  . feeding supplement (NEPRO CARB STEADY)  237 mL Oral BID BM  . insulin aspart  0-5 Units Subcutaneous QHS  . insulin aspart  0-9 Units Subcutaneous TID WC  . insulin glargine  50 Units Subcutaneous Daily  . loratadine  10 mg Oral QPM  . metoprolol  50 mg Oral BID  . multivitamin  1 tablet Oral QHS  . pneumococcal 23 valent vaccine  0.5 mL Intramuscular Tomorrow-1000  . predniSONE  30 mg Oral Q breakfast  . saccharomyces boulardii  250 mg Oral BID  . sodium chloride  3 mL Intravenous Q12H  . sodium chloride  3 mL Intravenous Q12H  . vancomycin  125 mg Oral QID   Continuous Infusions:  PRN Meds:.sodium chloride, acetaminophen **OR** acetaminophen, bisacodyl, diphenhydrAMINE, ondansetron **OR** ondansetron (ZOFRAN) IV, polyethylene glycol, simethicone, sodium chloride  Current Labs: reviewed    Physical Exam:  Blood pressure 131/69, pulse 95, temperature 97.9 F (36.6 C), temperature source Oral, resp. rate 18, height 6' (1.829 m), weight 84.324 kg (185 lb 14.4 oz), SpO2 99 %. GEN: appears less well than previously ENT: NCAT, [place] on place EYES: EOMI CV: RRR PULM: CTAB ABD:  mildly protuberant.  EXT:2-3+ Pitting LEE  Outpt HD Orders Unit: [place] Days: MWF Time: 4h Dialyzer: F180 EDW: 87.5kg K/Ca: 2/2 Access: AVG Needle Size: 15g BFR/DFR: 450/800 UF Proflie: profile 2 VDRA: calcitriol 1.60mcg qTx EPO: none currently IV Fe: none currently Heparin: 6100 units qTx Most Recent Phos / PTH: 3.5 / 458 Most Recent TSAT / Ferritin: 21 / 919 Most Recent eKT/V: 1.21 Treatment Adherence: excellent  A/P 1. ESRD:  1. Cont on MWF schedule for now; extra Tx today 7/20 2/2 hypervolemia 2. AVG, use heparin 2. Severe Pulm HTN: 1. Probably explains #4 and #3 2. Cont UF today with goal 4L 3. Cardiology to see 3. HTN/and hypervolemia:  1. Below outpt EDW already 2. Cont to challenge to address ascites, pleural effusion, LE Edema 3. BP stable 4. Inc AST/ALT Tbili; ? Ascites 1. Hepatitis serologies HBV, HCV, HAV negative 2. Abd [place] reviewed, ? Cirrhosis 3. Cont UF for any hepatic congestion 5. Diarrhea / C Diff: per TRH 6. Anemia: Hb stable 7. MBD: historically doing well, phos is not cause of itching 8. Itching: rinse HD filter twice, ? If related to inc bilirubin?  Pearson Grippe MD 11/07/2014, 10:40 AM   Recent Labs Lab 11/05/14 2052 11/06/14 0315 11/07/14 0021  NA 134* 131* 138  K 4.8 4.3 4.1  CL 92* 90* 92*  CO2 27 27 30   GLUCOSE  431* 427* 215*  BUN 62* 70* 52*  CREATININE 5.82* 6.14* 5.29*  CALCIUM 7.8* 7.8* 8.5*    Recent Labs Lab 11/05/14 2052 11/06/14 0315 11/07/14 0021  WBC 15.0* 14.4* 13.2*  NEUTROABS 13.2*  --   --   HGB 11.7* 12.5* 14.2  HCT 35.6* 37.9* 42.6  MCV 94.2 96.7 94.5  PLT 185 176 162

## 2014-11-07 NOTE — Consult Note (Signed)
CARDIOLOGY CONSULT NOTE   Patient ID: Joseph [name] MRN: [id number], DOB/AGE: [birth year]   Admit date: 11/05/2014 Date of Consult: 11/07/2014  Primary Physician: Webb Silversmith, NP Primary Cardiologist: Dr Aundra Dubin  Reason for consult:  CHF  Problem List  Past Medical History  Diagnosis Date  . Atrial fibrillation     a. 11/18/2011 s/p DCCV - 150J;  b. Anticoagulation w/ Apixaban;  c. 11/2011 back in afib->asymptomatic.  . H/O alcohol abuse     a. drinks heavily on the weekends.  . Hypertension   . Diabetes mellitus   . Heart murmur     a. 09/2011 Echo: EF 55-60%, Triv AI, Mild MR, mildly dil LA.  . Diabetic peripheral neuropathy   . CKD (chronic kidney disease), stage III   . CHF (congestive heart failure)   . Pneumonia   . Sleep apnea     Past Surgical History  Procedure Laterality Date  . Eye sx  11/11/2011    left eye  . Cardioversion  11/18/2011    Procedure: CARDIOVERSION;  Surgeon: Josue Hector, MD;  Location: [facility];  Service: Cardiovascular;  Laterality: N/A;  . Colonoscopy w/ biopsies and polypectomy    . Av fistula placement Left 02/13/2014    Procedure: INSERTION OF ARTERIOVENOUS (AV) GORE-TEX GRAFT ARM;  Surgeon: Angelia Mould, MD;  Location: [place];  Service: Vascular;  Laterality: Left;  . Right heart catheterization N/A 10/11/2013    Procedure: RIGHT HEART CATH;  Surgeon: Larey Dresser, MD;  Location: [facility] C[facility] LAB;  Service: Cardiovascular;  Laterality: N/A;    Allergies  Allergies  Allergen Reactions  . Dilaudid [Hydromorphone] Nausea And Vomiting   HPI   59 yo with history of CKD, chronic atrial fibrillation, and chronic diastolic CHF, followed by Dr Aundra Dubin in the CHF clinic for management of CHF and elevated PA pressure on echo. Patient has had renal failure, probably due to HTN and diabetes. He follows with Dr. Florene Glen. He has been in atrial fibrillation since 6/13 when he failed a cardioversion and it was decided to leave him in atrial  fibrillation on coumadin. Most recent echo in 4/15 showed EF 62-22% with PA systolic pressure 85 mmHg. RHC in 6/15 showing elevated right and left heart filling pressures with moderate pulmonary hypertension, primarily pulmonary venous hypertension.  He was seen by Dr Aundra Dubin on 07/03/2014 and he instructed to decrease his dry weights as he was fluid overloaded as well as increase metoprolol to 100 mg po BID.  He was doing ok until 3 weeks ago when he developed diarrheas, no nausea/vomiting, also developed LE edema, PND and DOE. No chest pain or palpitations, no syncope. He was admitted on 7/18, positive for C.diff, fluid overloaded. Elevated liver enzymes. He was started on daily hemodialysis that he has been undergoing the last 3 days.  He states that his SOB has improved.   Inpatient Medications  . antiseptic oral rinse  7 mL Mouth Rinse BID  . atorvastatin  40 mg Oral q1800  . clobetasol cream  1 application Topical BID  . colchicine  0.6 mg Oral Daily  . diltiazem  120 mg Oral Daily  . feeding supplement (NEPRO CARB STEADY)  237 mL Oral BID BM  . insulin aspart  0-5 Units Subcutaneous QHS  . insulin aspart  0-9 Units Subcutaneous TID WC  . insulin glargine  50 Units Subcutaneous Daily  . loratadine  10 mg Oral QPM  . metoprolol  50 mg Oral BID  .  multivitamin  1 tablet Oral QHS  . pneumococcal 23 valent vaccine  0.5 mL Intramuscular Tomorrow-1000  . predniSONE  30 mg Oral Q breakfast  . saccharomyces boulardii  250 mg Oral BID  . sodium chloride  3 mL Intravenous Q12H  . sodium chloride  3 mL Intravenous Q12H  . vancomycin  125 mg Oral QID   Family History Family History  Problem Relation Age of Onset  . Heart disease Mother     before age 80  . Diabetes Father   . Diabetes Sister   . Peripheral vascular disease Sister     amputation  . Cancer Neg Hx   . Stroke Neg Hx     Social History History   Social History  . Marital Status: Married    Spouse Name: N/A  .  Number of Children: N/A  . Years of Education: N/A   Occupational History  . Not on file.   Social History Main Topics  . Smoking status: Former Smoker -- 0.01 packs/day for 5 years    Types: Cigarettes    Quit date: 04/20/1994  . Smokeless tobacco: Never Used     Comment: some in college  . Alcohol Use: No  . Drug Use: No  . Sexual Activity: Yes   Other Topics Concern  . Not on file   Social History Narrative    Review of Systems  General:  No chills, fever, night sweats or weight changes.  Cardiovascular:  No chest pain, dyspnea on exertion, edema, orthopnea, palpitations, paroxysmal nocturnal dyspnea. Dermatological: No rash, lesions/masses Respiratory: No cough, dyspnea Urologic: No hematuria, dysuria Abdominal:   No nausea, vomiting, diarrhea, bright red blood per rectum, melena, or hematemesis Neurologic:  No visual changes, wkns, changes in mental status. All other systems reviewed and are otherwise negative except as noted above.  Physical Exam  Blood pressure 131/69, pulse 95, temperature 97.9 F (36.6 C), temperature source Oral, resp. rate 18, height 6' (1.829 m), weight 185 lb 14.4 oz (84.324 kg), SpO2 99 %.  General: NAD  Neck: Thick, JVP up to the jaws. no thyromegaly or thyroid nodule.  Lungs: CTAB Cardiac: Irregular S1S2, no S3S4. 2+ ankle edema. No carotid bruit. Normal pedal pulses.  Abdomen: Soft, nontender, no hepatosplenomegaly, nondistended.  Skin: Intact without lesions or rashes.  Neurologic: Alert and oriented x 3.  Psych: Normal affect. Extremities: No clubbing or cyanosis.   Labs   Recent Labs  11/05/14 2205  TROPONINI 0.07*   Lab Results  Component Value Date   WBC 13.2* 11/07/2014   HGB 14.2 11/07/2014   HCT 42.6 11/07/2014   MCV 94.5 11/07/2014   PLT 162 11/07/2014    Recent Labs Lab 11/07/14 0021  NA 138  K 4.1  CL 92*  CO2 30  BUN 52*  CREATININE 5.29*  CALCIUM 8.5*  PROT 6.7  BILITOT 2.3*  ALKPHOS 143*   ALT 109*  AST 124*  GLUCOSE 215*   Lab Results  Component Value Date   CHOL 99 02/23/2014   HDL 27* 02/23/2014   LDLCALC 52 02/23/2014   TRIG 101 02/23/2014   Radiology/Studies  Dg Chest 2 View  11/05/2014   CLINICAL DATA:  Dyspnea for 1 day  EXAM: CHEST  2 VIEW  COMPARISON:  July 03, 2014  FINDINGS: The heart size and mediastinal contours are stable. There is mild patchy consolidation of lateral right lung base with small right pleural effusion. There is mild chronic central pulmonary vascular congestion. The visualized  skeletal structures are stable.  IMPRESSION: Chronic central pulmonary vascular congestion.  Small right pleural effusion with associated mild patchy opacity of right lung base which could be due to atelectasis but superimposed pneumonia is not excluded.   Electronically Signed   By: Abelardo Diesel M.D.   On: 11/05/2014 21:54   US Abdomen Complete  11/06/2014   CLINICAL DATA:  Abnormal liver function tests  EXAM: ULTRASOUND ABDOMEN COMPLETE  COMPARISON:  None.  FINDINGS: Gallbladder: No gallstones or wall thickening visualized. No sonographic Murphy sign noted.  Common bile duct: Diameter: 4 mm  Liver: Minimally nodular contour with no focal abnormalities.  IVC: No abnormality visualized.  Pancreas: Visualized portion unremarkable.  Spleen: Size and appearance within normal limits.  Right Kidney: Length: 10.7 cm. Midpole cyst measuring 14 mm. Mildly increased echogenicity.  Left Kidney: Length: 10.8 cm. Lower pole cyst measures 1 cm. Mildly increased echogenicity.  Abdominal aorta: No aneurysm identified. Limited visualization distally due to bowel gas.  Other findings: Right pleural effusion.  Small volume ascites.  IMPRESSION: Mildly nodular liver contour with small volume of ascites and right pleural effusion. Possibility of cirrhosis not excluded.  Evidence of medical renal disease.   Electronically Signed   By: Skipper Cliche M.D.   On: 11/06/2014 21:38   Echocardiogram -  [facility] - 08/21/2014 - moderate LVH, LVEF 50-55%, mild MR, moderate TR, mildly decreased RV function.  RVSP 90 mmHg  ECG: a-fib, right axis deviation, negative T waves in the inferolateral leads present on the prior ECGs    ASSESSMENT AND PLAN  1. Acute on Chronic diastolic CHF: on admission 90 kg, baseline weight 84 kg, s/p daily hemodialysis, weights back to baseline, however persistent LE edema and some SOB. I would continue another round of HD tomorrow.  Increase metoprolol dose to 50 mg po in the AM and 100 mg po in the evening.   2. Elevated troponin- minimal, sec to CHF and ESRD.  3. Hypertension - controlled.  4. Atrial fibrillation: Chronic. He is on metoprolol and diltiazem CD for rate control.He is on warfarin.   5. C.diff colitis - management per primary team.    Signed, Dorothy Spark, MD, [facility] 11/07/2014, 11:42 AM

## 2014-11-07 NOTE — Clinical Documentation Improvement (Signed)
Registered Dietician documented 11/06/2014 "Severe malnutrition in context of chronic illness" and height 6 feet, weight 194 lbs 0.1 oz, and BMI 26.31.  Please document in your future progress notes and discharge summary if you agree with severe malnutrition as per RD.  Possible Clinical Conditions: -Severe malnutrition in context of chronic illness -Malnutrition, other severity (please specify) -Other Condition (please specify) -Unable to determine at present  Thank you, Mateo Flow, RN [phone] Clinical Documentation Specialist

## 2014-11-08 DIAGNOSIS — E119 Type 2 diabetes mellitus without complications: Secondary | ICD-10-CM

## 2014-11-08 DIAGNOSIS — N186 End stage renal disease: Secondary | ICD-10-CM | POA: Diagnosis not present

## 2014-11-08 DIAGNOSIS — A047 Enterocolitis due to Clostridium difficile: Secondary | ICD-10-CM | POA: Diagnosis not present

## 2014-11-08 DIAGNOSIS — I481 Persistent atrial fibrillation: Secondary | ICD-10-CM | POA: Diagnosis not present

## 2014-11-08 DIAGNOSIS — I10 Essential (primary) hypertension: Secondary | ICD-10-CM | POA: Diagnosis not present

## 2014-11-08 DIAGNOSIS — I270 Primary pulmonary hypertension: Secondary | ICD-10-CM | POA: Diagnosis not present

## 2014-11-08 DIAGNOSIS — Z992 Dependence on renal dialysis: Secondary | ICD-10-CM

## 2014-11-08 LAB — CBC
HCT: 37.8 % — ABNORMAL LOW (ref 39.0–52.0)
Hemoglobin: 12.3 g/dL — ABNORMAL LOW (ref 13.0–17.0)
MCH: 31.4 pg (ref 26.0–34.0)
MCHC: 32.5 g/dL (ref 30.0–36.0)
MCV: 96.4 fL (ref 78.0–100.0)
Platelets: 129 10*3/uL — ABNORMAL LOW (ref 150–400)
RBC: 3.92 MIL/uL — ABNORMAL LOW (ref 4.22–5.81)
RDW: 18.3 % — ABNORMAL HIGH (ref 11.5–15.5)
WBC: 11.1 10*3/uL — ABNORMAL HIGH (ref 4.0–10.5)

## 2014-11-08 LAB — GLUCOSE, CAPILLARY
GLUCOSE-CAPILLARY: 152 mg/dL — AB (ref 65–99)
GLUCOSE-CAPILLARY: 361 mg/dL — AB (ref 65–99)
GLUCOSE-CAPILLARY: 403 mg/dL — AB (ref 65–99)
Glucose-Capillary: 245 mg/dL — ABNORMAL HIGH (ref 65–99)

## 2014-11-08 LAB — BASIC METABOLIC PANEL
Anion gap: 14 (ref 5–15)
BUN: 55 mg/dL — ABNORMAL HIGH (ref 6–20)
CALCIUM: 8.4 mg/dL — AB (ref 8.9–10.3)
CO2: 25 mmol/L (ref 22–32)
Chloride: 100 mmol/L — ABNORMAL LOW (ref 101–111)
Creatinine, Ser: 4.93 mg/dL — ABNORMAL HIGH (ref 0.61–1.24)
GFR calc Af Amer: 14 mL/min — ABNORMAL LOW (ref >60)
GFR calc non Af Amer: 12 mL/min — ABNORMAL LOW (ref >60)
Glucose, Bld: 336 mg/dL — ABNORMAL HIGH (ref 65–99)
Potassium: 3.8 mmol/L (ref 3.5–5.1)
SODIUM: 139 mmol/L (ref 135–145)

## 2014-11-08 LAB — PROTIME-INR
INR: 1.66 — ABNORMAL HIGH (ref 0.00–1.49)
Prothrombin Time: 19.6 seconds — ABNORMAL HIGH (ref 11.6–15.2)

## 2014-11-08 LAB — HEMOGLOBIN A1C
Hgb A1c MFr Bld: 9.4 % — ABNORMAL HIGH (ref 4.8–5.6)
MEAN PLASMA GLUCOSE: 223 mg/dL

## 2014-11-08 MED ORDER — WARFARIN - PHARMACIST DOSING INPATIENT
Freq: Every day | Status: DC
  Administered 2014-11-08: 18:00:00

## 2014-11-08 MED ORDER — DIPHENHYDRAMINE HCL 25 MG PO CAPS
ORAL_CAPSULE | ORAL | Status: AC
  Administered 2014-11-08: 10:00:00
  Filled 2014-11-08: qty 2

## 2014-11-08 MED ORDER — HEPARIN SODIUM (PORCINE) 1000 UNIT/ML DIALYSIS
100.0000 [IU]/kg | INTRAMUSCULAR | Status: DC | PRN
  Filled 2014-11-08: qty 9

## 2014-11-08 MED ORDER — WARFARIN SODIUM 2.5 MG PO TABS
2.5000 mg | ORAL_TABLET | Freq: Once | ORAL | Status: AC
  Administered 2014-11-08: 2.5 mg via ORAL
  Filled 2014-11-08: qty 1

## 2014-11-08 MED ORDER — PREDNISONE 20 MG PO TABS
30.0000 mg | ORAL_TABLET | Freq: Every day | ORAL | Status: DC
  Administered 2014-11-09: 30 mg via ORAL
  Filled 2014-11-08 (×2): qty 1

## 2014-11-08 NOTE — Progress Notes (Signed)
DAILY PROGRESS NOTE  Subjective:  No events overnight. Seen at dialysis today. Feels better with UF. HR better controlled. BP stable.  Objective:  Temp:  [97.4 F (36.3 C)-98 F (36.7 C)] 97.5 F (36.4 C) (07/21 0900) Pulse Rate:  [79-93] 86 (07/21 0930) Resp:  [11-20] 11 (07/21 0930) BP: (107-141)/(68-86) 124/80 mmHg (07/21 0930) SpO2:  [95 %-99 %] 95 % (07/21 0900) Weight:  [180 lb 12.4 oz (82 kg)-186 lb 8.2 oz (84.6 kg)] 185 lb 6.5 oz (84.1 kg) (07/21 0900) Weight change: -7 lb 7.9 oz (-3.4 kg)  Intake/Output from previous day: 07/20 0701 - 07/21 0700 In: 820 [P.O.:820] Out: 2265   Intake/Output from this shift: Total I/O In: 240 [P.O.:240] Out: -   Medications: Current Facility-Administered Medications  Medication Dose Route Frequency Provider Last Rate Last Dose  . 0.9 %  sodium chloride infusion  250 mL Intravenous PRN Roney Jaffe, MD      . acetaminophen (TYLENOL) tablet 650 mg  650 mg Oral Q6H PRN Roney Jaffe, MD   650 mg at 11/07/14 2035   Or  . acetaminophen (TYLENOL) suppository 650 mg  650 mg Rectal Q6H PRN Roney Jaffe, MD      . antiseptic oral rinse (CPC / CETYLPYRIDINIUM CHLORIDE 0.05%) solution 7 mL  7 mL Mouth Rinse BID Roney Jaffe, MD   7 mL at 11/07/14 1000  . atorvastatin (LIPITOR) tablet 40 mg  40 mg Oral q1800 Roney Jaffe, MD   40 mg at 11/07/14 1819  . bisacodyl (DULCOLAX) EC tablet 5 mg  5 mg Oral Daily PRN Roney Jaffe, MD      . clobetasol cream (TEMOVATE) 3.42 % 1 application  1 application Topical BID Sherwood Gambler, MD   1 application at 87/68/11 2035  . colchicine tablet 0.6 mg  0.6 mg Oral Daily Roney Jaffe, MD   0.6 mg at 11/07/14 1108  . diltiazem (CARDIZEM CD) 24 hr capsule 120 mg  120 mg Oral Daily Roney Jaffe, MD   120 mg at 11/07/14 1113  . diphenhydrAMINE (BENADRYL) 25 mg capsule           . diphenhydrAMINE (BENADRYL) capsule 25-50 mg  25-50 mg Oral Q4H PRN Jeryl Columbia, NP   50 mg at 11/08/14 0920  .  feeding supplement (NEPRO CARB STEADY) liquid 237 mL  237 mL Oral BID BM Asencion Islam, RD   237 mL at 11/07/14 1000  . heparin injection 8,300 Units  100 Units/kg Dialysis PRN Rexene Agent, MD      . insulin aspart (novoLOG) injection 0-5 Units  0-5 Units Subcutaneous QHS Roney Jaffe, MD   5 Units at 11/07/14 2250  . insulin aspart (novoLOG) injection 0-9 Units  0-9 Units Subcutaneous TID WC Roney Jaffe, MD   3 Units at 11/08/14 [phone]  . insulin glargine (LANTUS) injection 50 Units  50 Units Subcutaneous Daily Roney Jaffe, MD   50 Units at 11/07/14 1111  . loratadine (CLARITIN) tablet 10 mg  10 mg Oral QPM Roney Jaffe, MD   10 mg at 11/07/14 1819  . metoprolol (LOPRESSOR) tablet 100 mg  100 mg Oral QHS Dorothy Spark, MD   100 mg at 11/07/14 2251  . metoprolol (LOPRESSOR) tablet 50 mg  50 mg Oral Daily Dorothy Spark, MD      . multivitamin (RENA-VIT) tablet 1 tablet  1 tablet Oral QHS Roney Jaffe, MD   1 tablet at 11/07/14 2251  . ondansetron (ZOFRAN) tablet  4 mg  4 mg Oral Q6H PRN Roney Jaffe, MD       Or  . ondansetron [facility]) injection 4 mg  4 mg Intravenous Q6H PRN Roney Jaffe, MD      . polyethylene glycol (MIRALAX / GLYCOLAX) packet 17 g  17 g Oral Daily PRN Roney Jaffe, MD      . predniSONE (DELTASONE) tablet 30 mg  30 mg Oral Q breakfast Roney Jaffe, MD   30 mg at 11/07/14 0810  . saccharomyces boulardii (FLORASTOR) capsule 250 mg  250 mg Oral BID Bonnielee Haff, MD   250 mg at 11/07/14 2251  . simethicone (MYLICON) chewable tablet 160 mg  160 mg Oral QID PRN Rhetta Mura Schorr, NP   160 mg at 11/06/14 0600  . sodium chloride 0.9 % injection 3 mL  3 mL Intravenous Q12H Roney Jaffe, MD   3 mL at 11/07/14 1116  . sodium chloride 0.9 % injection 3 mL  3 mL Intravenous Q12H Roney Jaffe, MD   3 mL at 11/07/14 2252  . sodium chloride 0.9 % injection 3 mL  3 mL Intravenous PRN Roney Jaffe, MD      . vancomycin (VANCOCIN) 50 mg/mL oral solution 125  mg  125 mg Oral QID Bonnielee Haff, MD   125 mg at 11/07/14 2252    Physical Exam: General appearance: alert and no distress Lungs: clear to auscultation bilaterally Heart: regular rate and rhythm, S1, S2 normal, no murmur, click, rub or gallop Extremities: extremities normal, atraumatic, no cyanosis or edema Skin: multiple pruritic excorations over the back Neurologic: Grossly normal  Lab Results: Results for orders placed or performed during the hospital encounter of 11/05/14 (from the past 48 hour(s))  Glucose, capillary     Status: Abnormal   Collection Time: 11/06/14 11:24 AM  Result Value Ref Range   Glucose-Capillary 226 (H) 65 - 99 mg/dL   Comment 1 Notify RN   Clostridium Difficile by PCR (not at [facility])     Status: Abnormal   Collection Time: 11/06/14 11:28 AM  Result Value Ref Range   C difficile by pcr POSITIVE (A) NEGATIVE    Comment: CRITICAL RESULT CALLED TO, READ BACK BY AND VERIFIED WITH: S. LEWIS RN 12:35 11/06/14 ([email or web address])   Glucose, capillary     Status: Abnormal   Collection Time: 11/06/14  7:32 PM  Result Value Ref Range   Glucose-Capillary 110 (H) 65 - 99 mg/dL  Glucose, capillary     Status: Abnormal   Collection Time: 11/06/14  8:47 PM  Result Value Ref Range   Glucose-Capillary 133 (H) 65 - 99 mg/dL  CBC     Status: Abnormal   Collection Time: 11/07/14 12:21 AM  Result Value Ref Range   WBC 13.2 (H) 4.0 - 10.5 K/uL   RBC 4.51 4.22 - 5.81 MIL/uL   Hemoglobin 14.2 13.0 - 17.0 g/dL   HCT 42.6 39.0 - 52.0 %   MCV 94.5 78.0 - 100.0 fL   MCH 31.5 26.0 - 34.0 pg   MCHC 33.3 30.0 - 36.0 g/dL   RDW 18.3 (H) 11.5 - 15.5 %   Platelets 162 150 - 400 K/uL  Comprehensive metabolic panel     Status: Abnormal   Collection Time: 11/07/14 12:21 AM  Result Value Ref Range   Sodium 138 135 - 145 mmol/L    Comment: DELTA CHECK NOTED   Potassium 4.1 3.5 - 5.1 mmol/L   Chloride 92 (L) 101 - 111 mmol/L  CO2 30 22 - 32 mmol/L   Glucose, Bld 215 (H) 65 - 99 mg/dL    BUN 52 (H) 6 - 20 mg/dL   Creatinine, Ser 5.29 (H) 0.61 - 1.24 mg/dL   Calcium 8.5 (L) 8.9 - 10.3 mg/dL   Total Protein 6.7 6.5 - 8.1 g/dL   Albumin 3.5 3.5 - 5.0 g/dL   AST 124 (H) 15 - 41 U/L   ALT 109 (H) 17 - 63 U/L   Alkaline Phosphatase 143 (H) 38 - 126 U/L   Total Bilirubin 2.3 (H) 0.3 - 1.2 mg/dL   GFR calc non Af Amer 11 (L) >60 mL/min   GFR calc Af Amer 12 (L) >60 mL/min    Comment: (NOTE) The eGFR has been calculated using the CKD EPI equation. This calculation has not been validated in all clinical situations. eGFR's persistently <60 mL/min signify possible Chronic Kidney Disease.    Anion gap 16 (H) 5 - 15  Protime-INR     Status: Abnormal   Collection Time: 11/07/14 12:21 AM  Result Value Ref Range   Prothrombin Time 25.9 (H) 11.6 - 15.2 seconds   INR 2.41 (H) 0.00 - 1.49  Hemoglobin A1c     Status: Abnormal   Collection Time: 11/07/14 12:21 AM  Result Value Ref Range   Hgb A1c MFr Bld 9.4 (H) 4.8 - 5.6 %    Comment: (NOTE)         Pre-diabetes: 5.7 - 6.4         Diabetes: >6.4         Glycemic control for adults with diabetes: <7.0    Mean Plasma Glucose 223 mg/dL    Comment: (NOTE) Performed At: [facility] [place], [place] [id number] Lindon Romp MD OE:[id number]   Magnesium     Status: None   Collection Time: 11/07/14 12:21 AM  Result Value Ref Range   Magnesium 2.2 1.7 - 2.4 mg/dL  Glucose, capillary     Status: Abnormal   Collection Time: 11/07/14  5:48 AM  Result Value Ref Range   Glucose-Capillary 350 (H) 65 - 99 mg/dL   Comment 1 Notify RN    Comment 2 Document in Chart   Glucose, capillary     Status: Abnormal   Collection Time: 11/07/14 11:10 AM  Result Value Ref Range   Glucose-Capillary 378 (H) 65 - 99 mg/dL  Glucose, capillary     Status: Abnormal   Collection Time: 11/07/14 12:00 PM  Result Value Ref Range   Glucose-Capillary 377 (H) 65 - 99 mg/dL   Comment 1 Notify RN    Comment 2 Document in Chart     Glucose, capillary     Status: Abnormal   Collection Time: 11/07/14  5:00 PM  Result Value Ref Range   Glucose-Capillary 144 (H) 65 - 99 mg/dL   Comment 1 Notify RN    Comment 2 Document in Chart   Glucose, capillary     Status: Abnormal   Collection Time: 11/07/14  9:18 PM  Result Value Ref Range   Glucose-Capillary 437 (H) 65 - 99 mg/dL  Basic metabolic panel     Status: Abnormal   Collection Time: 11/08/14  3:13 AM  Result Value Ref Range   Sodium 139 135 - 145 mmol/L   Potassium 3.8 3.5 - 5.1 mmol/L   Chloride 100 (L) 101 - 111 mmol/L   CO2 25 22 - 32 mmol/L   Glucose, Bld 336 (H) 65 -  99 mg/dL   BUN 55 (H) 6 - 20 mg/dL   Creatinine, Ser 4.93 (H) 0.61 - 1.24 mg/dL   Calcium 8.4 (L) 8.9 - 10.3 mg/dL   GFR calc non Af Amer 12 (L) >60 mL/min   GFR calc Af Amer 14 (L) >60 mL/min    Comment: (NOTE) The eGFR has been calculated using the CKD EPI equation. This calculation has not been validated in all clinical situations. eGFR's persistently <60 mL/min signify possible Chronic Kidney Disease.    Anion gap 14 5 - 15  CBC     Status: Abnormal   Collection Time: 11/08/14  3:13 AM  Result Value Ref Range   WBC 11.1 (H) 4.0 - 10.5 K/uL   RBC 3.92 (L) 4.22 - 5.81 MIL/uL   Hemoglobin 12.3 (L) 13.0 - 17.0 g/dL   HCT 37.8 (L) 39.0 - 52.0 %   MCV 96.4 78.0 - 100.0 fL   MCH 31.4 26.0 - 34.0 pg   MCHC 32.5 30.0 - 36.0 g/dL   RDW 18.3 (H) 11.5 - 15.5 %   Platelets 129 (L) 150 - 400 K/uL  Glucose, capillary     Status: Abnormal   Collection Time: 11/08/14  6:22 AM  Result Value Ref Range   Glucose-Capillary 245 (H) 65 - 99 mg/dL    Imaging: US Abdomen Complete  11/06/2014   CLINICAL DATA:  Abnormal liver function tests  EXAM: ULTRASOUND ABDOMEN COMPLETE  COMPARISON:  None.  FINDINGS: Gallbladder: No gallstones or wall thickening visualized. No sonographic Murphy sign noted.  Common bile duct: Diameter: 4 mm  Liver: Minimally nodular contour with no focal abnormalities.  IVC: No  abnormality visualized.  Pancreas: Visualized portion unremarkable.  Spleen: Size and appearance within normal limits.  Right Kidney: Length: 10.7 cm. Midpole cyst measuring 14 mm. Mildly increased echogenicity.  Left Kidney: Length: 10.8 cm. Lower pole cyst measures 1 cm. Mildly increased echogenicity.  Abdominal aorta: No aneurysm identified. Limited visualization distally due to bowel gas.  Other findings: Right pleural effusion.  Small volume ascites.  IMPRESSION: Mildly nodular liver contour with small volume of ascites and right pleural effusion. Possibility of cirrhosis not excluded.  Evidence of medical renal disease.   Electronically Signed   By: Skipper Cliche M.D.   On: 11/06/2014 21:38    Assessment:  1. Principal Problem: 2.   C. difficile diarrhea 3. Active Problems: 4.   Atrial fibrillation-permanent 5.   Essential hypertension 6.   Obstructive sleep apnea 7.   Pulmonary hypertension 8.   Diabetes mellitus, controlled 9.   Rash 10.   HCAP (healthcare-associated pneumonia) 86.   Volume overload 12.   ESRD on hemodialysis 13.   Dyspnea 14.   Abnormal LFTs 15.   Supratherapeutic INR 16.   Plan:  1. Continue dialysis and UF per renal. No adjustment to CHF meds at this time.  Time Spent Directly with Patient:  15 minutes  Length of Stay:  LOS: 3 days   Pixie Casino, MD, [facility] Attending Cardiologist [place] 11/08/2014, 10:21 AM

## 2014-11-08 NOTE — Progress Notes (Signed)
Inpatient Diabetes Program Recommendations  AACE/ADA: New Consensus Statement on Inpatient Glycemic Control (2013)  Target Ranges:  Prepandial:   less than 140 mg/dL      Peak postprandial:   less than 180 mg/dL (1-2 hours)      Critically ill patients:  140 - 180 mg/dL   Results for Joseph [name], Joseph [name] (MRN [id number]) as of 11/08/2014 09:14  Ref. Range 11/07/2014 05:48 11/07/2014 11:10 11/07/2014 12:00 11/07/2014 17:00 11/07/2014 21:18 11/08/2014 06:22  Glucose-Capillary Latest Ref Range: 65-99 mg/dL 350 (H) 378 (H) 377 (H) 144 (H) 437 (H) 245 (H)   Reason for Admission: CHF  Diabetes history: DM 2 Outpatient Diabetes medications: Lantus 50 units Daily Current orders for Inpatient glycemic control: Lantus 50 units, Novolog Sensitive + HS scale  Inpatient Diabetes Program Recommendations Insulin - Basal: Glucose in the 300's yesterday. Please consider increasing basal insulin to 58 units Daily. Consider additional units this am. Correction (SSI): Please increase correction to Novolog Moderate.  Thanks,  Tama Headings RN, MSN, [facility] Inpatient Diabetes Coordinator Team Pager [phone]

## 2014-11-08 NOTE — Procedures (Signed)
I was present at this dialysis session. I have reviewed the session itself and made appropriate changes.   Outpt EDW 87.5kg.  Post HD yesterday 82kg with cramps took off 2.2L.  Lytes WNL. Hb ok.  Cardiology note reviewed.  Will do 3h 3L treatment today.  HD tomorrow in AM if here.  Need new EDW upon dc.   Pearson Grippe  MD 11/08/2014, 9:19 AM

## 2014-11-08 NOTE — Progress Notes (Signed)
ANTICOAGULATION CONSULT NOTE - Initial Consult  Pharmacy Consult for Warfarin Indication: atrial fibrillation  Allergies  Allergen Reactions  . Dilaudid [Hydromorphone] Nausea And Vomiting    Patient Measurements: Height: 6' (182.9 cm) Weight: 180 lb 8.9 oz (81.9 kg) IBW/kg (Calculated) : 77.6   Vital Signs: Temp: 97 F (36.1 C) (07/21 1228) Temp Source: Oral (07/21 1228) BP: 125/83 mmHg (07/21 1228) Pulse Rate: 97 (07/21 1228)  Labs:  Recent Labs  11/05/14 2205 11/06/14 0315 11/06/14 0550 11/07/14 0021 11/08/14 0313 11/08/14 1015  HGB  --  12.5*  --  14.2 12.3*  --   HCT  --  37.9*  --  42.6 37.8*  --   PLT  --  176  --  162 129*  --   LABPROT 80.3*  --  47.8* 25.9*  --  19.6*  INR >10.00*  --  5.43* 2.41*  --  1.66*  CREATININE  --  6.14*  --  5.29* 4.93*  --   TROPONINI 0.07*  --   --   --   --   --     Estimated Creatinine Clearance: 17.7 mL/min (by C-G formula based on Cr of 4.93).   Medical History: Past Medical History  Diagnosis Date  . Atrial fibrillation     a. 11/18/2011 s/p DCCV - 150J;  b. Anticoagulation w/ Apixaban;  c. 11/2011 back in afib->asymptomatic.  . H/O alcohol abuse     a. drinks heavily on the weekends.  . Hypertension   . Diabetes mellitus   . Heart murmur     a. 09/2011 Echo: EF 55-60%, Triv AI, Mild MR, mildly dil LA.  . Diabetic peripheral neuropathy   . CKD (chronic kidney disease), stage III   . CHF (congestive heart failure)   . Pneumonia   . Sleep apnea     Medications:  Scheduled:  . antiseptic oral rinse  7 mL Mouth Rinse BID  . atorvastatin  40 mg Oral q1800  . clobetasol cream  1 application Topical BID  . colchicine  0.6 mg Oral Daily  . diltiazem  120 mg Oral Daily  . diphenhydrAMINE      . feeding supplement (NEPRO CARB STEADY)  237 mL Oral BID BM  . insulin aspart  0-5 Units Subcutaneous QHS  . insulin aspart  0-9 Units Subcutaneous TID WC  . insulin glargine  50 Units Subcutaneous Daily  . loratadine   10 mg Oral QPM  . metoprolol tartrate  100 mg Oral QHS  . metoprolol  50 mg Oral Daily  . multivitamin  1 tablet Oral QHS  . predniSONE  30 mg Oral Q breakfast  . saccharomyces boulardii  250 mg Oral BID  . sodium chloride  3 mL Intravenous Q12H  . sodium chloride  3 mL Intravenous Q12H  . vancomycin  125 mg Oral QID    Assessment: 59yo male with history of ESRD on HD MWF presents with SOB, diarrhea and leg swelling. Patient initially presented with a supratherapeutic INR greater than 10 on 11/05/2014. INR trending down reported 1.66 today. Pharmacy consulted for Coumadin management   Coumadin PTA dose 5mg  daily  Results for Joseph [name], Joseph [name] (MRN [id number]) as of 11/08/2014 13:01  Ref. Range 11/05/2014 22:05 11/06/2014 05:50 11/07/2014 00:21 11/08/2014 10:15  INR Latest Ref Range: 0.00-1.49  >10.00 (HH) 5.43 (HH) 2.41 (H) 1.66 (H)    Goal of Therapy:  INR 2-3 Monitor platelets by anticoagulation protocol: Yes   Plan:  -Coumadin 2.5mg  x  1 dose tonight -Daily PT/INR -Monitor for s/sx bleeding   [employer].D., BCPS 11/08/2014,1:10 PM

## 2014-11-08 NOTE — Progress Notes (Signed)
[facility] PROGRESS NOTE  Joseph [name] YQI:[id number] DOB: 1956/04/03 DOA: 11/05/2014 PCP: Webb Silversmith, NP  Assessment/Plan: 1. Acute on chronic diastolic congestive heart failure -Patient with history of diastolic CHF, end-stage renal disease on hemodialysis presenting with clinical signs and symptoms suggestive of acute decompensated heart failure. -He underwent hemodialysis on 11/06/2014 at 11/07/2014 with subsequent improvement to symptoms. -He currently denies chest pain and reports improvement to dyspnea. -Cardiology not recommending adjustment to CHF meds -Plan for patient to undergo hemodialysis tomorrow morning anticipate discharge in the next 24 hours if remains stable  2.  Severe pulmonary hypertension -Transthoracic echocardiogram performed at [facility] on 08/21/2014 showing evidence of severe pulmonary hypertension. I discussed case with nephrology who recommended a cardiology consultation. Estimated right intraocular systolic pressure 99 mmHg, with right-sided heart failure likely leading to congestion hepatitis and significant fluid overload. -Cardiology was consulted  3. 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  -Lab work showing elevated transaminases with a.m. lab work showing an AST of 124 and ALT of 109, and total bilirubin of 2.3. -Suspect is related to congestion hepatitis -Repeat, present metabolic panel in a.m.  4.  End-stage renal disease on hemodialysis Mondays Wednesdays and Fridays -Nephrology consulted patient undergoing hemodialysis during this hospitalization  5.  History of atrial fibrillation -He initially presented with a supratherapeutic INR greater than 10 on 11/05/2014. INR trending down now within the therapeutic range at 2.41, pharmacy consulted for Coumadin management -Continue Cardizem 120 mg by mouth daily and metoprolol 50 mg by mouth daily  6.  Insulin dependent diabetes mellitus -Continue Lantus 50 units subcutaneous  daily  7.  C. difficile colitis -Patient reporting recent antimicrobial use,  -Stool coming back positive for C. difficile, continue oral vancomycin -Patient reporting improvement to diarrheal symptoms  Code Status: Full code Family Communication: Spoke with his wife Disposition Plan: Anticipate discharge home next 24 hours   Consultants:  Nephrology  Cardiology  Procedures:  Hemodialysis    HPI/Subjective: Patient is a pleasant 59 year old gentleman with a past medical history of hypertension, type 2 diabetes mellitus, end-stage renal disease who was admitted to the medicine service on 11/06/2014 when he presented with complaints of increasing shortness of breath associate with bilateral lower extremity edema. Symptoms were felt to be related to volume overload. Nephrology was consulted as he was seen and evaluated by Dr.Sanford. He underwent hemodialysis on 11/06/2014 and 11/07/2014, and subsequent improvement to symptoms.  Objective: Filed Vitals:   11/08/14 1352  BP: 132/49  Pulse: 92  Temp: 98.4 F (36.9 C)  Resp: 18    Intake/Output Summary (Last 24 hours) at 11/08/14 1657 Last data filed at 11/08/14 1228  Gross per 24 hour  Intake    700 ml  Output   2334 ml  Net  -1634 ml   Filed Weights   11/08/14 0537 11/08/14 0900 11/08/14 1228  Weight: 82.691 kg (182 lb 4.8 oz) 84.1 kg (185 lb 6.5 oz) 81.9 kg (180 lb 8.9 oz)    Exam:   General:  Patient is sitting at bedside chair, reports feeling better  Cardiovascular: Regular rate and rhythm normal L8-V5, 2/6 systolic ejection murmur  Respiratory: Normal respiratory effort, lungs are clear to auscultation bilaterally  Abdomen: Soft nontender nondistended positive bowel sounds  Musculoskeletal: No edema  Data Reviewed: Basic Metabolic Panel:  Recent Labs Lab 11/05/14 2052 11/06/14 0315 11/07/14 0021 11/08/14 0313  NA 134* 131* 138 139  K 4.8 4.3 4.1 3.8  CL 92* 90* 92* 100*  CO2 27  27 30 25    GLUCOSE 431* 427* 215* 336*  BUN 62* 70* 52* 55*  CREATININE 5.82* 6.14* 5.29* 4.93*  CALCIUM 7.8* 7.8* 8.5* 8.4*  MG  --   --  2.2  --    Liver Function Tests:  Recent Labs Lab 11/05/14 2052 11/06/14 0315 11/07/14 0021  AST 111* 107* 124*  ALT 94* 96* 109*  ALKPHOS 114 112 143*  BILITOT 1.7* 1.8* 2.3*  PROT 5.9* 6.0* 6.7  ALBUMIN 3.1* 3.1* 3.5   No results for input(s): LIPASE, AMYLASE in the last 168 hours. No results for input(s): AMMONIA in the last 168 hours. CBC:  Recent Labs Lab 11/05/14 2052 11/06/14 0315 11/07/14 0021 11/08/14 0313  WBC 15.0* 14.4* 13.2* 11.1*  NEUTROABS 13.2*  --   --   --   HGB 11.7* 12.5* 14.2 12.3*  HCT 35.6* 37.9* 42.6 37.8*  MCV 94.2 96.7 94.5 96.4  PLT 185 176 162 129*   Cardiac Enzymes:  Recent Labs Lab 11/05/14 2205  TROPONINI 0.07*   BNP (last 3 results)  Recent Labs  11/05/14 2205  BNP 3626.3*    ProBNP (last 3 results)  Recent Labs  01/15/14 1221 02/20/14 1515 02/24/14 1145  PROBNP 9297.0* 15468.0* 10410.0*    CBG:  Recent Labs Lab 11/07/14 1700 11/07/14 2118 11/08/14 0622 11/08/14 1250 11/08/14 1632  GLUCAP 144* 437* 245* 152* 361*    Recent Results (from the past 240 hour(s))  Culture, blood (routine x 2)     Status: None (Preliminary result)   Collection Time: 11/05/14 10:15 PM  Result Value Ref Range Status   Specimen Description BLOOD HAND RIGHT  Final   Special Requests BOTTLES DRAWN AEROBIC AND ANAEROBIC 5CC  Final   Culture NO GROWTH 2 DAYS  Final   Report Status PENDING  Incomplete  Culture, blood (routine x 2)     Status: None (Preliminary result)   Collection Time: 11/05/14 11:01 PM  Result Value Ref Range Status   Specimen Description BLOOD RIGHT FOREARM  Final   Special Requests BOTTLES DRAWN AEROBIC ONLY 1CC  Final   Culture NO GROWTH 2 DAYS  Final   Report Status PENDING  Incomplete  Clostridium Difficile by PCR (not at [facility])     Status: Abnormal   Collection Time: 11/06/14  11:28 AM  Result Value Ref Range Status   C difficile by pcr POSITIVE (A) NEGATIVE Final    Comment: CRITICAL RESULT CALLED TO, READ BACK BY AND VERIFIED WITH: Chauncey Cruel LEWIS RN 12:35 11/06/14 ([email or web address])      Studies: US Abdomen Complete  11/06/2014   CLINICAL DATA:  Abnormal liver function tests  EXAM: ULTRASOUND ABDOMEN COMPLETE  COMPARISON:  None.  FINDINGS: Gallbladder: No gallstones or wall thickening visualized. No sonographic Murphy sign noted.  Common bile duct: Diameter: 4 mm  Liver: Minimally nodular contour with no focal abnormalities.  IVC: No abnormality visualized.  Pancreas: Visualized portion unremarkable.  Spleen: Size and appearance within normal limits.  Right Kidney: Length: 10.7 cm. Midpole cyst measuring 14 mm. Mildly increased echogenicity.  Left Kidney: Length: 10.8 cm. Lower pole cyst measures 1 cm. Mildly increased echogenicity.  Abdominal aorta: No aneurysm identified. Limited visualization distally due to bowel gas.  Other findings: Right pleural effusion.  Small volume ascites.  IMPRESSION: Mildly nodular liver contour with small volume of ascites and right pleural effusion. Possibility of cirrhosis not excluded.  Evidence of medical renal disease.   Electronically Signed   By: Elodia Florence.D.  On: 11/06/2014 21:38    Scheduled Meds: . antiseptic oral rinse  7 mL Mouth Rinse BID  . atorvastatin  40 mg Oral q1800  . clobetasol cream  1 application Topical BID  . colchicine  0.6 mg Oral Daily  . diltiazem  120 mg Oral Daily  . feeding supplement (NEPRO CARB STEADY)  237 mL Oral BID BM  . insulin aspart  0-5 Units Subcutaneous QHS  . insulin aspart  0-9 Units Subcutaneous TID WC  . insulin glargine  50 Units Subcutaneous Daily  . loratadine  10 mg Oral QPM  . metoprolol tartrate  100 mg Oral QHS  . metoprolol  50 mg Oral Daily  . multivitamin  1 tablet Oral QHS  . predniSONE  30 mg Oral Q breakfast  . saccharomyces boulardii  250 mg Oral BID  . sodium chloride  3  mL Intravenous Q12H  . sodium chloride  3 mL Intravenous Q12H  . vancomycin  125 mg Oral QID  . warfarin  2.5 mg Oral ONCE-1800  . Warfarin - Pharmacist Dosing Inpatient   Does not apply q1800   Continuous Infusions:   Principal Problem:   C. difficile diarrhea Active Problems:   Atrial fibrillation-permanent   Essential hypertension   Obstructive sleep apnea   Pulmonary hypertension   Diabetes mellitus, controlled   Rash   HCAP (healthcare-associated pneumonia)   Volume overload   ESRD on hemodialysis   Dyspnea   Abnormal LFTs   Supratherapeutic INR    Time spent: 25 min    Kelvin Cellar  [facility] Pager [phone]. If 7PM-7AM, please contact night-coverage at [email or web address], password [facility] 11/08/2014, 4:57 PM  LOS: 3 days

## 2014-11-09 DIAGNOSIS — R0600 Dyspnea, unspecified: Secondary | ICD-10-CM | POA: Diagnosis not present

## 2014-11-09 DIAGNOSIS — R7989 Other specified abnormal findings of blood chemistry: Secondary | ICD-10-CM

## 2014-11-09 DIAGNOSIS — R791 Abnormal coagulation profile: Secondary | ICD-10-CM

## 2014-11-09 DIAGNOSIS — E43 Unspecified severe protein-calorie malnutrition: Secondary | ICD-10-CM

## 2014-11-09 DIAGNOSIS — A047 Enterocolitis due to Clostridium difficile: Secondary | ICD-10-CM | POA: Diagnosis not present

## 2014-11-09 DIAGNOSIS — N186 End stage renal disease: Secondary | ICD-10-CM | POA: Diagnosis not present

## 2014-11-09 LAB — COMPREHENSIVE METABOLIC PANEL
ALBUMIN: 3.3 g/dL — AB (ref 3.5–5.0)
ALT: 113 U/L — ABNORMAL HIGH (ref 17–63)
ANION GAP: 13 (ref 5–15)
AST: 106 U/L — AB (ref 15–41)
Alkaline Phosphatase: 140 U/L — ABNORMAL HIGH (ref 38–126)
BILIRUBIN TOTAL: 1.4 mg/dL — AB (ref 0.3–1.2)
BUN: 62 mg/dL — AB (ref 6–20)
CHLORIDE: 95 mmol/L — AB (ref 101–111)
CO2: 26 mmol/L (ref 22–32)
CREATININE: 4.96 mg/dL — AB (ref 0.61–1.24)
Calcium: 8.3 mg/dL — ABNORMAL LOW (ref 8.9–10.3)
GFR calc Af Amer: 13 mL/min — ABNORMAL LOW (ref >60)
GFR, EST NON AFRICAN AMERICAN: 12 mL/min — AB (ref >60)
Glucose, Bld: 295 mg/dL — ABNORMAL HIGH (ref 65–99)
POTASSIUM: 3.3 mmol/L — AB (ref 3.5–5.1)
Sodium: 134 mmol/L — ABNORMAL LOW (ref 135–145)
Total Protein: 7.5 g/dL (ref 6.5–8.1)

## 2014-11-09 LAB — CBC
HCT: 38.8 % — ABNORMAL LOW (ref 39.0–52.0)
Hemoglobin: 12.9 g/dL — ABNORMAL LOW (ref 13.0–17.0)
MCH: 32 pg (ref 26.0–34.0)
MCHC: 33.2 g/dL (ref 30.0–36.0)
MCV: 96.3 fL (ref 78.0–100.0)
Platelets: 121 10*3/uL — ABNORMAL LOW (ref 150–400)
RBC: 4.03 MIL/uL — ABNORMAL LOW (ref 4.22–5.81)
RDW: 18.2 % — ABNORMAL HIGH (ref 11.5–15.5)
WBC: 11.7 10*3/uL — AB (ref 4.0–10.5)

## 2014-11-09 LAB — PROTIME-INR
INR: 1.47 (ref 0.00–1.49)
Prothrombin Time: 17.9 seconds — ABNORMAL HIGH (ref 11.6–15.2)

## 2014-11-09 LAB — GLUCOSE, CAPILLARY
GLUCOSE-CAPILLARY: 238 mg/dL — AB (ref 65–99)
Glucose-Capillary: 106 mg/dL — ABNORMAL HIGH (ref 65–99)

## 2014-11-09 MED ORDER — VANCOMYCIN 50 MG/ML ORAL SOLUTION: ORAL | Status: DC

## 2014-11-09 MED ORDER — HEPARIN SODIUM (PORCINE) 1000 UNIT/ML DIALYSIS
100.0000 [IU]/kg | INTRAMUSCULAR | Status: DC | PRN
  Filled 2014-11-09: qty 9

## 2014-11-09 MED ORDER — SACCHAROMYCES BOULARDII 250 MG PO CAPS: 250.0000 mg | ORAL_CAPSULE | Freq: Two times a day (BID) | ORAL | Status: DC

## 2014-11-09 MED ORDER — METOPROLOL TARTRATE 100 MG PO TABS: 100.0000 mg | ORAL_TABLET | Freq: Every day | ORAL | Status: DC

## 2014-11-09 MED ORDER — DIPHENHYDRAMINE HCL 25 MG PO CAPS
ORAL_CAPSULE | ORAL | Status: AC
  Administered 2014-11-09: 11:00:00
  Filled 2014-11-09: qty 2

## 2014-11-09 MED ORDER — NEPRO/CARBSTEADY PO LIQD: 237.0000 mL | Freq: Two times a day (BID) | ORAL | Status: DC

## 2014-11-09 NOTE — Care Management Note (Signed)
Case Management Note  Patient Details  Name: Joseph [name] MRN: [id number] Date of Birth: [birth year]  Subjective/Objective:     Admitted with c diff colitis               Action/Plan: Patient lives with spouse, independent; to be discharged home on oral vancomycin; CM talked to patient, he is to go to [place] to pick up his medication at discharge; Prescription for vancomycin faxed to [facility] pharmacy as requested.  Expected Discharge Date:   11/09/2014               Expected Discharge Plan:  Home/Self Care  Discharge planning Services  CM Consult  Choice offered to:  Patient  Status of Service:  Completed, signed off   Sherrilyn Rist [phone] 11/09/2014, 2:21 PM

## 2014-11-09 NOTE — Discharge Summary (Addendum)
Physician Discharge Summary  Joseph [name] BMW:[id number] DOB: 03-Jan-1956 DOA: 11/05/2014  PCP: Webb Silversmith, NP  Admit date: 11/05/2014 Discharge date: 11/09/2014  Time spent: 35 minutes  Recommendations for Outpatient Follow-up:  1. Please follow up on INR, he is on coumadin and had an INR of 1.47 on day of discharge. Pharmacy recommended he take 7.5 mg on the evening of discharge then continue his normal home regimen 2. He was discharged on oral Vancomycin for C diff to complete a 14 day course.   3. Follow up on volume status  Discharge Diagnoses:  Principal Problem:   C. difficile diarrhea Active Problems:   Atrial fibrillation-permanent   Essential hypertension   Obstructive sleep apnea   Pulmonary hypertension   Diabetes mellitus, controlled   Rash   HCAP (healthcare-associated pneumonia)   Volume overload   ESRD on hemodialysis   Dyspnea   Abnormal LFTs   Supratherapeutic INR   Protein-calorie malnutrition, severe   Discharge Condition: Stable  Diet recommendation: Renal diet  Filed Weights   11/09/14 0446 11/09/14 0947 11/09/14 1305  Weight: 83.28 kg (183 lb 9.6 oz) 83.6 kg (184 lb 4.9 oz) 79.5 kg (175 lb 4.3 oz)    History of present illness:  Joseph [name] is a 59 y.o. male with hx of HTN, DM2, ESRD on HD MWF since Nov 2015, esrd due to DM. Presenting to ED with SOB, abd swelling, bilat LE swelling for 1 week. Also has pruritic rash on back has seen derm MD for and is on prednisone for this. Wife says poor appetite for a while. No cough, no fevers, no chills, no CP. +some orthopnea. No PND.   NO abd pain, n/v. +diarrhea for about a month off and on. No joint pain.   Hospital Course:  Patient is a pleasant 59 year old gentleman with a past medical history of hypertension, type 2 diabetes mellitus, end-stage renal disease who was admitted to the medicine service on 11/06/2014 when he presented with complaints of increasing shortness of breath associate with  bilateral lower extremity edema. Symptoms were felt to be related to volume overload. Nephrology was consulted as he was seen and evaluated by Dr.Sanford. He underwent hemodialysis on 11/06/2014 and 11/07/2014, and subsequent improvement to symptoms.   Acute on chronic diastolic congestive heart failure -Patient with history of diastolic CHF, end-stage renal disease on hemodialysis presenting with clinical signs and symptoms suggestive of acute decompensated heart failure. -He underwent hemodialysis during this hospitalization with subsequent improvement to symptoms. -Cardiology not recommending adjustment to CHF meds  2. Severe pulmonary hypertension -Transthoracic echocardiogram performed at [facility] on 08/21/2014 showing evidence of severe pulmonary hypertension. I discussed case with nephrology who recommended a cardiology consultation. Estimated right intraocular systolic pressure 99 mmHg, with right-sided heart failure likely leading to congestion hepatitis and significant fluid overload. -Will follow up with Dr Aundra Dubin of cardiology in the office  3. 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  -Lab work showing elevated transaminases with a.m. lab work showing an AST of 124 and ALT of 109, and total bilirubin of 2.3. -Suspect is related to congestion hepatitis  4. End-stage renal disease on hemodialysis Mondays Wednesdays and Fridays -Nephrology consulted patient undergoing hemodialysis during this hospitalization  5. History of atrial fibrillation -He initially presented with a supratherapeutic INR greater than 10 on 11/05/2014.  -Continue Cardizem 120 mg by mouth daily and metoprolol 50 mg by mouth daily -INR was 1.47 on day of discharge, pharmacy recommended 7.5 mg PO this evening -Please  follow up on INR he was instructed to have his INR checked on Monday 11/12/2014  6. Insulin dependent diabetes mellitus -Continue Lantus 50 units subcutaneous daily  7. C. difficile  colitis -Patient reporting recent antimicrobial use,  -Stool coming back positive for C. difficile, continue oral vancomycin -Patient reporting improvement to diarrheal symptoms -Will complete 14 days of treatment with Vanc   Consultations:  Nephrology  Cardiology  Discharge Exam: Filed Vitals:   11/09/14 1305  BP: 157/101  Pulse: 93  Temp: 96.8 F (36 C)  Resp: 22     General: Patient is sitting at bedside chair, reports feeling better  Cardiovascular: Regular rate and rhythm normal G2-I9, 2/6 systolic ejection murmur  Respiratory: Normal respiratory effort, lungs are clear to auscultation bilaterally  Abdomen: Soft nontender nondistended positive bowel sounds  Musculoskeletal: No edema  Discharge Instructions   Discharge Instructions    (HEART FAILURE PATIENTS) Call MD:  Anytime you have any of the following symptoms: 1) 3 pound weight gain in 24 hours or 5 pounds in 1 week 2) shortness of breath, with or without a dry hacking cough 3) swelling in the hands, feet or stomach 4) if you have to sleep on extra pillows at night in order to breathe.    Complete by:  As directed      Call MD for:  difficulty breathing, headache or visual disturbances    Complete by:  As directed      Call MD for:  extreme fatigue    Complete by:  As directed      Call MD for:  hives    Complete by:  As directed      Call MD for:  persistant dizziness or light-headedness    Complete by:  As directed      Call MD for:  persistant nausea and vomiting    Complete by:  As directed      Call MD for:  redness, tenderness, or signs of infection (pain, swelling, redness, odor or green/yellow discharge around incision site)    Complete by:  As directed      Call MD for:  severe uncontrolled pain    Complete by:  As directed      Call MD for:  temperature >100.4    Complete by:  As directed      Call MD for:    Complete by:  As directed      Diet - low sodium heart healthy    Complete by:   As directed      Diet - low sodium heart healthy    Complete by:  As directed      Discharge instructions    Complete by:  As directed   Please take 10 mg of Coumadin this evening, followed by your normal coumadin dose     Discharge instructions    Complete by:  As directed   Please take 7.5 mg of coumadin this evening then continue with your normal coumadin dose. Have your INR levels checked on Monday     Increase activity slowly    Complete by:  As directed      Increase activity slowly    Complete by:  As directed           Current Discharge Medication List    START taking these medications   Details  Nutritional Supplements (FEEDING SUPPLEMENT, NEPRO CARB STEADY,) LIQD Take 237 mLs by mouth 2 (two) times daily between meals. Qty: 20 Can, Refills:  0    saccharomyces boulardii (FLORASTOR) 250 MG capsule Take 1 capsule (250 mg total) by mouth 2 (two) times daily. Qty: 30 capsule, Refills: 0    vancomycin (VANCOCIN) 50 mg/mL oral solution 125 mg PO 4 times daily for 10 days. Qty Sufficient Qty: 1 mL, Refills: 0      CONTINUE these medications which have CHANGED   Details  metoprolol (LOPRESSOR) 100 MG tablet Take 1 tablet (100 mg total) by mouth at bedtime. Qty: 30 tablet, Refills: 1      CONTINUE these medications which have NOT CHANGED   Details  atorvastatin (LIPITOR) 40 MG tablet Take 40 mg by mouth daily.    b complex-vitamin c-folic acid (NEPHRO-VITE) 0.8 MG TABS tablet Take 1 tablet by mouth daily.    clobetasol cream (TEMOVATE) 1.61 % Apply 1 application topically 2 (two) times daily. Refills: 2    colchicine 0.6 MG tablet TAKE 1 TABLET (0.6 MG TOTAL) BY MOUTH 2 (TWO) TIMES DAILY. Qty: 60 tablet, Refills: 2    diazepam (VALIUM) 5 MG tablet Take 1 tablet (5 mg total) by mouth every 6 (six) hours as needed for sedation (itching). Qty: 10 tablet, Refills: 0    diltiazem (CARDIZEM CD) 120 MG 24 hr capsule Take 1 capsule (120 mg total) by mouth daily. Qty: 30  capsule, Refills: 0    LANTUS SOLOSTAR 100 UNIT/ML injection Inject 50 Units into the skin daily. Sliding scale    levocetirizine (XYZAL) 5 MG tablet Take 5 mg by mouth every evening.    predniSONE (DELTASONE) 5 MG tablet Take 30 mg by mouth daily with breakfast.    sildenafil (VIAGRA) 100 MG tablet Take 100 mg by mouth daily as needed for erectile dysfunction.    warfarin (COUMADIN) 5 MG tablet Take 1 tablet (5 mg total) by mouth daily. Qty: 30 tablet, Refills: 0    ethyl chloride spray Apply 1 application topically every other day. Mon Wed Fri - done with dialysis Refills: 6    ONETOUCH VERIO test strip       STOP taking these medications     cephALEXin (KEFLEX) 500 MG capsule        Allergies  Allergen Reactions  . Dilaudid [Hydromorphone] Nausea And Vomiting   Follow-up Information    Follow up with Pixie Casino, MD In 2 weeks.   Specialty:  Cardiology   Contact information:   [address] [place] [address] [phone]       Follow up with Webb Silversmith, NP In 2 weeks.   Specialty:  Internal Medicine   Contact information:   [place] [place] [address] [phone]        The results of significant diagnostics from this hospitalization (including imaging, microbiology, ancillary and laboratory) are listed below for reference.    Significant Diagnostic Studies: Dg Chest 2 View  11/05/2014   CLINICAL DATA:  Dyspnea for 1 day  EXAM: CHEST  2 VIEW  COMPARISON:  July 03, 2014  FINDINGS: The heart size and mediastinal contours are stable. There is mild patchy consolidation of lateral right lung base with small right pleural effusion. There is mild chronic central pulmonary vascular congestion. The visualized skeletal structures are stable.  IMPRESSION: Chronic central pulmonary vascular congestion.  Small right pleural effusion with associated mild patchy opacity of right lung base which could be due to atelectasis but  superimposed pneumonia is not excluded.   Electronically Signed   By: Abelardo Diesel  M.D.   On: 11/05/2014 21:54   US Abdomen Complete  11/06/2014   CLINICAL DATA:  Abnormal liver function tests  EXAM: ULTRASOUND ABDOMEN COMPLETE  COMPARISON:  None.  FINDINGS: Gallbladder: No gallstones or wall thickening visualized. No sonographic Murphy sign noted.  Common bile duct: Diameter: 4 mm  Liver: Minimally nodular contour with no focal abnormalities.  IVC: No abnormality visualized.  Pancreas: Visualized portion unremarkable.  Spleen: Size and appearance within normal limits.  Right Kidney: Length: 10.7 cm. Midpole cyst measuring 14 mm. Mildly increased echogenicity.  Left Kidney: Length: 10.8 cm. Lower pole cyst measures 1 cm. Mildly increased echogenicity.  Abdominal aorta: No aneurysm identified. Limited visualization distally due to bowel gas.  Other findings: Right pleural effusion.  Small volume ascites.  IMPRESSION: Mildly nodular liver contour with small volume of ascites and right pleural effusion. Possibility of cirrhosis not excluded.  Evidence of medical renal disease.   Electronically Signed   By: Skipper Cliche M.D.   On: 11/06/2014 21:38    Microbiology: Recent Results (from the past 240 hour(s))  Culture, blood (routine x 2)     Status: None (Preliminary result)   Collection Time: 11/05/14 10:15 PM  Result Value Ref Range Status   Specimen Description BLOOD HAND RIGHT  Final   Special Requests BOTTLES DRAWN AEROBIC AND ANAEROBIC 5CC  Final   Culture NO GROWTH 3 DAYS  Final   Report Status PENDING  Incomplete  Culture, blood (routine x 2)     Status: None (Preliminary result)   Collection Time: 11/05/14 11:01 PM  Result Value Ref Range Status   Specimen Description BLOOD RIGHT FOREARM  Final   Special Requests BOTTLES DRAWN AEROBIC ONLY 1CC  Final   Culture NO GROWTH 3 DAYS  Final   Report Status PENDING  Incomplete  Clostridium Difficile by PCR (not at [facility])     Status: Abnormal    Collection Time: 11/06/14 11:28 AM  Result Value Ref Range Status   C difficile by pcr POSITIVE (A) NEGATIVE Final    Comment: CRITICAL RESULT CALLED TO, READ BACK BY AND VERIFIED WITH: S. LEWIS RN 12:35 11/06/14 ([email or web address])      Labs: Basic Metabolic Panel:  Recent Labs Lab 11/05/14 2052 11/06/14 0315 11/07/14 0021 11/08/14 0313 11/09/14 0434  NA 134* 131* 138 139 134*  K 4.8 4.3 4.1 3.8 3.3*  CL 92* 90* 92* 100* 95*  CO2 27 27 30 25 26   GLUCOSE 431* 427* 215* 336* 295*  BUN 62* 70* 52* 55* 62*  CREATININE 5.82* 6.14* 5.29* 4.93* 4.96*  CALCIUM 7.8* 7.8* 8.5* 8.4* 8.3*  MG  --   --  2.2  --   --    Liver Function Tests:  Recent Labs Lab 11/05/14 2052 11/06/14 0315 11/07/14 0021 11/09/14 0434  AST 111* 107* 124* 106*  ALT 94* 96* 109* 113*  ALKPHOS 114 112 143* 140*  BILITOT 1.7* 1.8* 2.3* 1.4*  PROT 5.9* 6.0* 6.7 7.5  ALBUMIN 3.1* 3.1* 3.5 3.3*   No results for input(s): LIPASE, AMYLASE in the last 168 hours. No results for input(s): AMMONIA in the last 168 hours. CBC:  Recent Labs Lab 11/05/14 2052 11/06/14 0315 11/07/14 0021 11/08/14 0313 11/09/14 0434  WBC 15.0* 14.4* 13.2* 11.1* 11.7*  NEUTROABS 13.2*  --   --   --   --   HGB 11.7* 12.5* 14.2 12.3* 12.9*  HCT 35.6* 37.9* 42.6 37.8* 38.8*  MCV 94.2 96.7 94.5 96.4 96.3  PLT 185  176 162 129* 121*   Cardiac Enzymes:  Recent Labs Lab 11/05/14 2205  TROPONINI 0.07*   BNP: BNP (last 3 results)  Recent Labs  11/05/14 2205  BNP 3626.3*    ProBNP (last 3 results)  Recent Labs  01/15/14 1221 02/20/14 1515 02/24/14 1145  PROBNP 9297.0* 15468.0* 10410.0*    CBG:  Recent Labs Lab 11/08/14 0622 11/08/14 1250 11/08/14 1632 11/08/14 2213 11/09/14 0633  GLUCAP 245* 152* 361* 403* 238*       Signed:  Kelvin Cellar  [facility] 11/09/2014, 1:28 PM

## 2014-11-09 NOTE — Progress Notes (Signed)
Volume being managed now with HD. Severe pulm HTN is not new - follows with Dr. Aundra Dubin as outpatient for this (PASP also severely elevated on 07/2013 echo). D/w Dr. Debara Pickett - no new recs, will sign off. Please notify us when patient is close to d/c so we can arrange follow-up. Dayna Dunn PA-C

## 2014-11-09 NOTE — Procedures (Signed)
I was present at this dialysis session. I have reviewed the session itself and made appropriate changes.   Still with 1+ edema in legs, otherwise doing well on RA breathing comfortably.  LFTs stable.  Hb stable.    HD yesterday, 2.3L UF, post weight 81.9kg.  Goal UF 3.5L today, set post weight as new EDW.  Upon DC challenge EDW by 0.5kg each of next three Tx.    Pearson Grippe  MD 11/09/2014, 9:14 AM

## 2014-11-11 LAB — CULTURE, BLOOD (ROUTINE X 2)
Culture: NO GROWTH
Culture: NO GROWTH

## 2014-11-15 ENCOUNTER — Encounter: Payer: Self-pay | Admitting: Internal Medicine

## 2014-11-15 ENCOUNTER — Ambulatory Visit (INDEPENDENT_AMBULATORY_CARE_PROVIDER_SITE_OTHER): Payer: 59 | Admitting: *Deleted

## 2014-11-15 ENCOUNTER — Ambulatory Visit (INDEPENDENT_AMBULATORY_CARE_PROVIDER_SITE_OTHER): Payer: 59 | Admitting: Internal Medicine

## 2014-11-15 VITALS — BP 142/66 | HR 100 | Temp 98.8°F

## 2014-11-15 DIAGNOSIS — I482 Chronic atrial fibrillation, unspecified: Secondary | ICD-10-CM

## 2014-11-15 DIAGNOSIS — I5033 Acute on chronic diastolic (congestive) heart failure: Secondary | ICD-10-CM

## 2014-11-15 DIAGNOSIS — R531 Weakness: Secondary | ICD-10-CM | POA: Diagnosis not present

## 2014-11-15 DIAGNOSIS — I5043 Acute on chronic combined systolic (congestive) and diastolic (congestive) heart failure: Secondary | ICD-10-CM | POA: Diagnosis not present

## 2014-11-15 DIAGNOSIS — R21 Rash and other nonspecific skin eruption: Secondary | ICD-10-CM | POA: Diagnosis not present

## 2014-11-15 DIAGNOSIS — I481 Persistent atrial fibrillation: Secondary | ICD-10-CM

## 2014-11-15 DIAGNOSIS — I4819 Other persistent atrial fibrillation: Secondary | ICD-10-CM

## 2014-11-15 LAB — POCT INR: INR: 2.4

## 2014-11-15 NOTE — Patient Instructions (Signed)

## 2014-11-15 NOTE — Progress Notes (Signed)
Pre visit review using our clinic review tool, if applicable. No additional management support is needed unless otherwise documented below in the visit note. 

## 2014-11-15 NOTE — Progress Notes (Signed)
Subjective:    Patient ID: Joseph [name], male    DOB: 1956-03-31, 59 y.o.   MRN: [id number]  HPI  Pt presents to the clinic today for hospital followup.  He was admitted to [facility] on 7/18 for worsening shortness of breath, lower extremity edema and intermittent diarrhea over the last month. He was found to be fluid overloaded secondary to his CHF and ESRD. He was hemodialized with excess fluid removal. His home medications were not changed. He also tested positive for CDiff during admission, which he was treated with oral Vancomycin. He also had a rash on his back that had been there for a while. He was seeing Dr. Allyson Sabal prior to admission. He was given Clobetasol cream without relief. Dr. Allyson Sabal then put him on Prednisone and he has not noticed any improvement in the rash. Since his discharge, he reports that he is very weak. He is unable to stand with out the use of a cane. He also reports that he feels like he needs a walker because he has almost fallen because he is so weak. He is using a wheelchair to get around, which he has never had to do before. He has to sit down to bath because his legs are to weak. He would like a referral to PT today. He has noticed some improvement in the shortness of breath and edema. He denies chest pain. His appetite is finally coming back. His bowels and bladder are okay. This rash is still irritating him. It is very itchy. He is putting Vaseline on it put does not know what else to do. He also request for a handicap placard due to his generalized weakness and chronic co morbidities.  Review of Systems      Past Medical History  Diagnosis Date  . Atrial fibrillation     a. 11/18/2011 s/p DCCV - 150J;  b. Anticoagulation w/ Apixaban;  c. 11/2011 back in afib->asymptomatic.  . H/O alcohol abuse     a. drinks heavily on the weekends.  . Hypertension   . Diabetes mellitus   . Heart murmur     a. 09/2011 Echo: EF 55-60%, Triv AI, Mild MR, mildly dil LA.  . Diabetic  peripheral neuropathy   . CKD (chronic kidney disease), stage III   . CHF (congestive heart failure)   . Pneumonia   . Sleep apnea     Current Outpatient Prescriptions  Medication Sig Dispense Refill  . atorvastatin (LIPITOR) 40 MG tablet Take 40 mg by mouth daily.    Marland Kitchen b complex-vitamin c-folic acid (NEPHRO-VITE) 0.8 MG TABS tablet Take 1 tablet by mouth daily.    . clobetasol cream (TEMOVATE) 0.73 % Apply 1 application topically 2 (two) times daily.  2  . colchicine 0.6 MG tablet TAKE 1 TABLET (0.6 MG TOTAL) BY MOUTH 2 (TWO) TIMES DAILY. 60 tablet 2  . diazepam (VALIUM) 5 MG tablet Take 1 tablet (5 mg total) by mouth every 6 (six) hours as needed for sedation (itching). 10 tablet 0  . diltiazem (CARDIZEM CD) 120 MG 24 hr capsule Take 1 capsule (120 mg total) by mouth daily. 30 capsule 0  . ethyl chloride spray Apply 1 application topically every other day. Mon Wed Fri - done with dialysis  6  . LANTUS SOLOSTAR 100 UNIT/ML injection Inject 50 Units into the skin daily. Sliding scale    . levocetirizine (XYZAL) 5 MG tablet Take 5 mg by mouth every evening.    . metoprolol (LOPRESSOR)  100 MG tablet Take 1 tablet (100 mg total) by mouth at bedtime. 30 tablet 1  . Nutritional Supplements (FEEDING SUPPLEMENT, NEPRO CARB STEADY,) LIQD Take 237 mLs by mouth 2 (two) times daily between meals. 20 Can 0  . ONETOUCH VERIO test strip     . predniSONE (DELTASONE) 5 MG tablet Take 30 mg by mouth daily with breakfast.    . saccharomyces boulardii (FLORASTOR) 250 MG capsule Take 1 capsule (250 mg total) by mouth 2 (two) times daily. 30 capsule 0  . sildenafil (VIAGRA) 100 MG tablet Take 100 mg by mouth daily as needed for erectile dysfunction.    . vancomycin (VANCOCIN) 50 mg/mL oral solution 125 mg PO 4 times daily for 10 days. Qty Sufficient 1 mL 0  . warfarin (COUMADIN) 5 MG tablet Take 1 tablet (5 mg total) by mouth daily. (Patient taking differently: Take 5 mg by mouth daily. 7.5 mg Sun and Tues. 5 mg  all other days) 30 tablet 0   No current facility-administered medications for this visit.    Allergies  Allergen Reactions  . Dilaudid [Hydromorphone] Nausea And Vomiting    Family History  Problem Relation Age of Onset  . Heart disease Mother     before age 51  . Diabetes Father   . Diabetes Sister   . Peripheral vascular disease Sister     amputation  . Cancer Neg Hx   . Stroke Neg Hx     History   Social History  . Marital Status: Married    Spouse Name: N/A  . Number of Children: N/A  . Years of Education: N/A   Occupational History  . Not on file.   Social History Main Topics  . Smoking status: Former Smoker -- 0.01 packs/day for 5 years    Types: Cigarettes    Quit date: 04/20/1994  . Smokeless tobacco: Never Used     Comment: some in college  . Alcohol Use: No  . Drug Use: No  . Sexual Activity: Yes   Other Topics Concern  . Not on file   Social History Narrative     Constitutional: Pt reports weakness and weight loss. Denies fever, fatigue, headache.  HEENT: Denies eye pain, eye redness, ear pain, ringing in the ears, wax buildup, runny nose, nasal congestion, bloody nose, or sore throat. Respiratory: Pt reports shortness of breath. Denies difficulty breathing, cough or sputum production.   Cardiovascular: Pt reports swelling in his legs. Denies chest pain, chest tightness, palpitations or swelling in the hands.  Gastrointestinal: Pt reports improving diarrhea. Denies abdominal pain, bloating, constipation,  or blood in the stool.  GU: Denies urgency, frequency, pain with urination, burning sensation, blood in urine, odor or discharge. Musculoskeletal: Pt reports generalized weakness. Denies decrease in range of motion, muscle pain or joint pain and swelling.  Skin: Pt reports rash on back and arms. Denies ulcercations.  Neurological: Pt reports problems with balance. Denies dizziness, difficulty with memory, difficulty with speech.  No other  specific complaints in a complete review of systems (except as listed in HPI above).  Objective:   Physical Exam   BP 142/66 mmHg  Pulse 100  Temp(Src) 98.8 F (37.1 C) (Oral)  Wt   SpO2 97%  Wt Readings from Last 3 Encounters:  11/09/14 175 lb 4.3 oz (79.5 kg)  10/28/14 198 lb (89.812 kg)  10/20/14 198 lb (89.812 kg)    General: Appears his stated age, chronically ill appearing in NAD. Skin: He has scattered  papular lesions noted on bilateral arms. He has scattered > 1 cm round, scaly lesions noted on back. Center of lesions are smooth with yellow base, some open but not draining. HEENT: Head: normal shape and size; Eyes: sclera white, no icterus, conjunctiva pink, PERRLA and EOMs intact;  Cardiovascular: Normal rate with irregular rhythm. Trace pitting BLE edema. No carotid bruits noted. Pulmonary/Chest: Normal effort and positive vesicular breath sounds. No respiratory distress. No wheezes, rales or ronchi noted.  Abdomen: Soft and nontender. Normal bowel sounds, no bruits noted. No distention or masses noted.  Musculoskeletal: Difficult to assess because he is to weak to get on the exam table. Neurological: Alert and oriented.    BMET    Component Value Date/Time   NA 134* 11/09/2014 0434   K 3.3* 11/09/2014 0434   CL 95* 11/09/2014 0434   CO2 26 11/09/2014 0434   GLUCOSE 295* 11/09/2014 0434   BUN 62* 11/09/2014 0434   CREATININE 4.96* 11/09/2014 0434   CREATININE 4.88* 10/17/2013 1027   CALCIUM 8.3* 11/09/2014 0434   GFRNONAA 12* 11/09/2014 0434   GFRAA 13* 11/09/2014 0434    Lipid Panel     Component Value Date/Time   CHOL 99 02/23/2014 0547   TRIG 101 02/23/2014 0547   HDL 27* 02/23/2014 0547   CHOLHDL 3.7 02/23/2014 0547   VLDL 20 02/23/2014 0547   LDLCALC 52 02/23/2014 0547    CBC    Component Value Date/Time   WBC 11.7* 11/09/2014 0434   RBC 4.03* 11/09/2014 0434   HGB 12.9* 11/09/2014 0434   HCT 38.8* 11/09/2014 0434   PLT 121* 11/09/2014  0434   MCV 96.3 11/09/2014 0434   MCH 32.0 11/09/2014 0434   MCHC 33.2 11/09/2014 0434   RDW 18.2* 11/09/2014 0434   LYMPHSABS 0.7 11/05/2014 2052   MONOABS 1.2* 11/05/2014 2052   EOSABS 0.0 11/05/2014 2052   BASOSABS 0.0 11/05/2014 2052    Hgb A1C Lab Results  Component Value Date   HGBA1C 9.4* 11/07/2014        Assessment & Plan:   Hospital follow up for acute on chronic heart failure, ESRD and Cdiff:  Hospital notes, labs and imaging reviewed He does have mild swelling in his legs but not difficulty breathing He will continue HD on MWF He will continue Metoprolol He is not on diuretic given kidney function He will continue Cardizem and Coumadin for his AFIB He has a follow up appt with cardiology scheduled He will continue Vancomycin for Cdiff Will repeat CBC, CMET and BNP today I think he would benefit from home health PT given his generalized weakness, will place order Handicap placard given  Rash:  This rash is like anything I have ever seen Continue Prednisone and Clobetasol Will request office notes from Dr. Allyson Sabal Will check HIV antibody today  RTC in 1 month to follow up chronic conditons

## 2014-11-16 ENCOUNTER — Ambulatory Visit: Payer: 59 | Admitting: Internal Medicine

## 2014-11-16 ENCOUNTER — Telehealth (HOSPITAL_COMMUNITY): Payer: Self-pay | Admitting: Internal Medicine

## 2014-11-16 ENCOUNTER — Telehealth: Payer: Self-pay | Admitting: Radiology

## 2014-11-16 LAB — CBC
HCT: 37 % — ABNORMAL LOW (ref 39.0–52.0)
Hemoglobin: 11.9 g/dL — ABNORMAL LOW (ref 13.0–17.0)
MCHC: 32.2 g/dL (ref 30.0–36.0)
MCV: 98.4 fl (ref 78.0–100.0)
PLATELETS: 156 10*3/uL (ref 150.0–400.0)
RBC: 3.77 Mil/uL — ABNORMAL LOW (ref 4.22–5.81)
RDW: 18.8 % — AB (ref 11.5–15.5)
WBC: 13.5 10*3/uL — AB (ref 4.0–10.5)

## 2014-11-16 LAB — COMPREHENSIVE METABOLIC PANEL
ALK PHOS: 217 U/L — AB (ref 39–117)
ALT: 227 U/L — ABNORMAL HIGH (ref 0–53)
AST: 172 U/L — AB (ref 0–37)
Albumin: 3.4 g/dL — ABNORMAL LOW (ref 3.5–5.2)
BILIRUBIN TOTAL: 1.3 mg/dL — AB (ref 0.2–1.2)
BUN: 60 mg/dL — ABNORMAL HIGH (ref 6–23)
CALCIUM: 8.7 mg/dL (ref 8.4–10.5)
CHLORIDE: 94 meq/L — AB (ref 96–112)
CO2: 27 meq/L (ref 19–32)
Creatinine, Ser: 4.61 mg/dL (ref 0.40–1.50)
GFR: 16.83 mL/min — ABNORMAL LOW (ref >60.00)
GLUCOSE: 703 mg/dL — AB (ref 70–99)
POTASSIUM: 4.4 meq/L (ref 3.5–5.1)
SODIUM: 133 meq/L — AB (ref 135–145)
TOTAL PROTEIN: 6.1 g/dL (ref 6.0–8.3)

## 2014-11-16 LAB — HIV ANTIBODY (ROUTINE TESTING W REFLEX): HIV: NONREACTIVE

## 2014-11-16 LAB — BRAIN NATRIURETIC PEPTIDE: Pro B Natriuretic peptide (BNP): >4940 pg/mL — ABNORMAL HIGH (ref 0.0–100.0)

## 2014-11-16 NOTE — Telephone Encounter (Signed)
[facility] called critical results, GLUCOSE- 708, CRT - 4.69. Results given to Webb Silversmith, NP.

## 2014-11-16 NOTE — Telephone Encounter (Signed)
.  Request for surgical clearance:  1. What type of surgery is being performed? Kidney Transplant   2. When is this surgery scheduled? "Not scheduled yet"   3. Are there any medications that need to be held prior to surgery and how long? "Not sure of that"   4. Name of physician performing surgery? The doctor will be on call   What is your office phone and fax number? Fax # [phone]  Comments: Emilee Hero with [facility] called states that the patient is being evaluated for a kidney transplant. ECHO showed a elevate PA pressure of  97.5. Their office wanted to talk to someone to be sure that he is aware of this because he will need clearance to prceed wit the procedure and wants to be sure that it is not a false number... she is faxing ECHO stress and ECHO to the [facility]

## 2014-11-16 NOTE — Telephone Encounter (Signed)
Noted, he is getting dialyzed today

## 2014-11-18 ENCOUNTER — Emergency Department (HOSPITAL_COMMUNITY): Payer: Medicare Other

## 2014-11-18 ENCOUNTER — Encounter (HOSPITAL_COMMUNITY): Payer: Self-pay | Admitting: Emergency Medicine

## 2014-11-18 ENCOUNTER — Inpatient Hospital Stay (HOSPITAL_COMMUNITY)
Admission: EM | Admit: 2014-11-18 | Discharge: 2014-12-02 | DRG: 871 | Disposition: A | Discharge status: Skilled Nursing Facility | Payer: Medicare Other | Attending: Internal Medicine | Admitting: Internal Medicine

## 2014-11-18 DIAGNOSIS — R410 Disorientation, unspecified: Secondary | ICD-10-CM | POA: Diagnosis not present

## 2014-11-18 DIAGNOSIS — I4891 Unspecified atrial fibrillation: Secondary | ICD-10-CM | POA: Diagnosis present

## 2014-11-18 DIAGNOSIS — R739 Hyperglycemia, unspecified: Secondary | ICD-10-CM

## 2014-11-18 DIAGNOSIS — R569 Unspecified convulsions: Secondary | ICD-10-CM | POA: Insufficient documentation

## 2014-11-18 DIAGNOSIS — I272 Other secondary pulmonary hypertension: Secondary | ICD-10-CM | POA: Diagnosis present

## 2014-11-18 DIAGNOSIS — D638 Anemia in other chronic diseases classified elsewhere: Secondary | ICD-10-CM | POA: Diagnosis present

## 2014-11-18 DIAGNOSIS — I482 Chronic atrial fibrillation: Secondary | ICD-10-CM | POA: Diagnosis present

## 2014-11-18 DIAGNOSIS — Z794 Long term (current) use of insulin: Secondary | ICD-10-CM | POA: Diagnosis not present

## 2014-11-18 DIAGNOSIS — Z79899 Other long term (current) drug therapy: Secondary | ICD-10-CM

## 2014-11-18 DIAGNOSIS — A414 Sepsis due to anaerobes: Secondary | ICD-10-CM | POA: Diagnosis present

## 2014-11-18 DIAGNOSIS — I248 Other forms of acute ischemic heart disease: Secondary | ICD-10-CM | POA: Diagnosis present

## 2014-11-18 DIAGNOSIS — Z885 Allergy status to narcotic agent status: Secondary | ICD-10-CM | POA: Diagnosis not present

## 2014-11-18 DIAGNOSIS — Z833 Family history of diabetes mellitus: Secondary | ICD-10-CM | POA: Diagnosis not present

## 2014-11-18 DIAGNOSIS — G0481 Other encephalitis and encephalomyelitis: Secondary | ICD-10-CM | POA: Diagnosis present

## 2014-11-18 DIAGNOSIS — E875 Hyperkalemia: Secondary | ICD-10-CM | POA: Diagnosis present

## 2014-11-18 DIAGNOSIS — N2581 Secondary hyperparathyroidism of renal origin: Secondary | ICD-10-CM | POA: Diagnosis present

## 2014-11-18 DIAGNOSIS — G9340 Encephalopathy, unspecified: Secondary | ICD-10-CM | POA: Insufficient documentation

## 2014-11-18 DIAGNOSIS — R21 Rash and other nonspecific skin eruption: Secondary | ICD-10-CM | POA: Diagnosis present

## 2014-11-18 DIAGNOSIS — I509 Heart failure, unspecified: Secondary | ICD-10-CM | POA: Diagnosis present

## 2014-11-18 DIAGNOSIS — E1310 Other specified diabetes mellitus with ketoacidosis without coma: Secondary | ICD-10-CM | POA: Diagnosis present

## 2014-11-18 DIAGNOSIS — G4733 Obstructive sleep apnea (adult) (pediatric): Secondary | ICD-10-CM | POA: Diagnosis present

## 2014-11-18 DIAGNOSIS — E871 Hypo-osmolality and hyponatremia: Secondary | ICD-10-CM | POA: Diagnosis present

## 2014-11-18 DIAGNOSIS — B952 Enterococcus as the cause of diseases classified elsewhere: Secondary | ICD-10-CM | POA: Diagnosis present

## 2014-11-18 DIAGNOSIS — Z6824 Body mass index (BMI) 24.0-24.9, adult: Secondary | ICD-10-CM | POA: Diagnosis not present

## 2014-11-18 DIAGNOSIS — G0490 Encephalitis and encephalomyelitis, unspecified: Secondary | ICD-10-CM | POA: Diagnosis not present

## 2014-11-18 DIAGNOSIS — I480 Paroxysmal atrial fibrillation: Secondary | ICD-10-CM | POA: Diagnosis not present

## 2014-11-18 DIAGNOSIS — M109 Gout, unspecified: Secondary | ICD-10-CM | POA: Diagnosis present

## 2014-11-18 DIAGNOSIS — Z8249 Family history of ischemic heart disease and other diseases of the circulatory system: Secondary | ICD-10-CM | POA: Diagnosis not present

## 2014-11-18 DIAGNOSIS — E1122 Type 2 diabetes mellitus with diabetic chronic kidney disease: Secondary | ICD-10-CM | POA: Diagnosis present

## 2014-11-18 DIAGNOSIS — E785 Hyperlipidemia, unspecified: Secondary | ICD-10-CM | POA: Diagnosis present

## 2014-11-18 DIAGNOSIS — N390 Urinary tract infection, site not specified: Secondary | ICD-10-CM | POA: Diagnosis present

## 2014-11-18 DIAGNOSIS — K7689 Other specified diseases of liver: Secondary | ICD-10-CM | POA: Diagnosis present

## 2014-11-18 DIAGNOSIS — Z7901 Long term (current) use of anticoagulants: Secondary | ICD-10-CM

## 2014-11-18 DIAGNOSIS — L309 Dermatitis, unspecified: Secondary | ICD-10-CM | POA: Diagnosis present

## 2014-11-18 DIAGNOSIS — I481 Persistent atrial fibrillation: Secondary | ICD-10-CM | POA: Diagnosis not present

## 2014-11-18 DIAGNOSIS — A0471 Enterocolitis due to Clostridium difficile, recurrent: Secondary | ICD-10-CM | POA: Insufficient documentation

## 2014-11-18 DIAGNOSIS — Z87891 Personal history of nicotine dependence: Secondary | ICD-10-CM

## 2014-11-18 DIAGNOSIS — Z992 Dependence on renal dialysis: Secondary | ICD-10-CM

## 2014-11-18 DIAGNOSIS — E8770 Fluid overload, unspecified: Secondary | ICD-10-CM | POA: Diagnosis present

## 2014-11-18 DIAGNOSIS — N186 End stage renal disease: Secondary | ICD-10-CM | POA: Diagnosis not present

## 2014-11-18 DIAGNOSIS — E1110 Type 2 diabetes mellitus with ketoacidosis without coma: Secondary | ICD-10-CM | POA: Diagnosis present

## 2014-11-18 DIAGNOSIS — A047 Enterocolitis due to Clostridium difficile: Secondary | ICD-10-CM | POA: Diagnosis present

## 2014-11-18 DIAGNOSIS — R059 Cough, unspecified: Secondary | ICD-10-CM

## 2014-11-18 DIAGNOSIS — R634 Abnormal weight loss: Secondary | ICD-10-CM

## 2014-11-18 DIAGNOSIS — I120 Hypertensive chronic kidney disease with stage 5 chronic kidney disease or end stage renal disease: Secondary | ICD-10-CM | POA: Diagnosis present

## 2014-11-18 DIAGNOSIS — R05 Cough: Secondary | ICD-10-CM

## 2014-11-18 DIAGNOSIS — R791 Abnormal coagulation profile: Secondary | ICD-10-CM | POA: Diagnosis present

## 2014-11-18 DIAGNOSIS — A0472 Enterocolitis due to Clostridium difficile, not specified as recurrent: Secondary | ICD-10-CM | POA: Diagnosis present

## 2014-11-18 LAB — BASIC METABOLIC PANEL
ANION GAP: 10 (ref 5–15)
ANION GAP: 16 — AB (ref 5–15)
Anion gap: 13 (ref 5–15)
BUN: 65 mg/dL — ABNORMAL HIGH (ref 6–20)
BUN: 83 mg/dL — ABNORMAL HIGH (ref 6–20)
BUN: 83 mg/dL — ABNORMAL HIGH (ref 6–20)
CALCIUM: 8.5 mg/dL — AB (ref 8.9–10.3)
CALCIUM: 7.7 mg/dL — AB (ref 8.9–10.3)
CHLORIDE: 96 mmol/L — AB (ref 101–111)
CO2: 19 mmol/L — ABNORMAL LOW (ref 22–32)
CO2: 22 mmol/L (ref 22–32)
CO2: 23 mmol/L (ref 22–32)
Calcium: 6.2 mg/dL — CL (ref 8.9–10.3)
Chloride: 101 mmol/L (ref 101–111)
Chloride: 92 mmol/L — ABNORMAL LOW (ref 101–111)
Creatinine, Ser: 4.29 mg/dL — ABNORMAL HIGH (ref 0.61–1.24)
Creatinine, Ser: 6.03 mg/dL — ABNORMAL HIGH (ref 0.61–1.24)
Creatinine, Ser: 5.58 mg/dL — ABNORMAL HIGH (ref 0.61–1.24)
GFR calc Af Amer: 11 mL/min — ABNORMAL LOW (ref >60)
GFR calc Af Amer: 12 mL/min — ABNORMAL LOW (ref >60)
GFR calc non Af Amer: 9 mL/min — ABNORMAL LOW (ref >60)
GFR calc non Af Amer: 10 mL/min — ABNORMAL LOW (ref >60)
GFR, EST AFRICAN AMERICAN: 16 mL/min — AB (ref >60)
GFR, EST NON AFRICAN AMERICAN: 14 mL/min — AB (ref >60)
GLUCOSE: 276 mg/dL — AB (ref 65–99)
Glucose, Bld: 678 mg/dL (ref 65–99)
Glucose, Bld: 223 mg/dL — ABNORMAL HIGH (ref 65–99)
POTASSIUM: 3.7 mmol/L (ref 3.5–5.1)
POTASSIUM: 4.9 mmol/L (ref 3.5–5.1)
POTASSIUM: 4.8 mmol/L (ref 3.5–5.1)
SODIUM: 130 mmol/L — AB (ref 135–145)
Sodium: 130 mmol/L — ABNORMAL LOW (ref 135–145)
Sodium: 132 mmol/L — ABNORMAL LOW (ref 135–145)

## 2014-11-18 LAB — CBC
HEMATOCRIT: 38.1 % — AB (ref 39.0–52.0)
Hemoglobin: 12.4 g/dL — ABNORMAL LOW (ref 13.0–17.0)
MCH: 31.2 pg (ref 26.0–34.0)
MCHC: 32.5 g/dL (ref 30.0–36.0)
MCV: 95.7 fL (ref 78.0–100.0)
PLATELETS: 209 10*3/uL (ref 150–400)
RBC: 3.98 MIL/uL — ABNORMAL LOW (ref 4.22–5.81)
RDW: 17.8 % — AB (ref 11.5–15.5)
WBC: 17.9 10*3/uL — AB (ref 4.0–10.5)

## 2014-11-18 LAB — COMPREHENSIVE METABOLIC PANEL
ALBUMIN: 3 g/dL — AB (ref 3.5–5.0)
ALT: 232 U/L — AB (ref 17–63)
AST: 135 U/L — AB (ref 15–41)
Alkaline Phosphatase: 206 U/L — ABNORMAL HIGH (ref 38–126)
Anion gap: 17 — ABNORMAL HIGH (ref 5–15)
BUN: 77 mg/dL — ABNORMAL HIGH (ref 6–20)
CALCIUM: 8.3 mg/dL — AB (ref 8.9–10.3)
CO2: 21 mmol/L — ABNORMAL LOW (ref 22–32)
Chloride: 84 mmol/L — ABNORMAL LOW (ref 101–111)
Creatinine, Ser: 5.31 mg/dL — ABNORMAL HIGH (ref 0.61–1.24)
GFR calc Af Amer: 12 mL/min — ABNORMAL LOW (ref >60)
GFR calc non Af Amer: 11 mL/min — ABNORMAL LOW (ref >60)
Glucose, Bld: 1006 mg/dL (ref 65–99)
Potassium: 5.8 mmol/L — ABNORMAL HIGH (ref 3.5–5.1)
Sodium: 122 mmol/L — ABNORMAL LOW (ref 135–145)
TOTAL PROTEIN: 6 g/dL — AB (ref 6.5–8.1)
Total Bilirubin: 1.5 mg/dL — ABNORMAL HIGH (ref 0.3–1.2)

## 2014-11-18 LAB — GLUCOSE, CAPILLARY
GLUCOSE-CAPILLARY: 551 mg/dL — AB (ref 65–99)
GLUCOSE-CAPILLARY: 473 mg/dL — AB (ref 65–99)
GLUCOSE-CAPILLARY: 348 mg/dL — AB (ref 65–99)
Glucose-Capillary: 394 mg/dL — ABNORMAL HIGH (ref 65–99)
Glucose-Capillary: 314 mg/dL — ABNORMAL HIGH (ref 65–99)
Glucose-Capillary: 248 mg/dL — ABNORMAL HIGH (ref 65–99)
Glucose-Capillary: 195 mg/dL — ABNORMAL HIGH (ref 65–99)

## 2014-11-18 LAB — MRSA PCR SCREENING: MRSA by PCR: NEGATIVE

## 2014-11-18 LAB — CBG MONITORING, ED
Glucose-Capillary: >600 mg/dL (ref 65–99)
Glucose-Capillary: >600 mg/dL (ref 65–99)

## 2014-11-18 LAB — LIPASE, BLOOD: Lipase: 97 U/L — ABNORMAL HIGH (ref 22–51)

## 2014-11-18 LAB — PROTIME-INR
INR: 3.64 — ABNORMAL HIGH (ref 0.00–1.49)
Prothrombin Time: 35.4 seconds — ABNORMAL HIGH (ref 11.6–15.2)

## 2014-11-18 LAB — AMMONIA: AMMONIA: 35 umol/L (ref 9–35)

## 2014-11-18 LAB — LACTIC ACID, PLASMA: LACTIC ACID, VENOUS: 1.7 mmol/L (ref 0.5–2.0)

## 2014-11-18 LAB — ETHANOL: Alcohol, Ethyl (B): 6 mg/dL — ABNORMAL HIGH (ref <5)

## 2014-11-18 MED ORDER — HEPARIN SODIUM (PORCINE) 5000 UNIT/ML IJ SOLN: 5000.0000 [IU] | Freq: Three times a day (TID) | INTRAMUSCULAR | Status: DC

## 2014-11-18 MED ORDER — INSULIN GLARGINE 100 UNIT/ML ~~LOC~~ SOLN
40.0000 [IU] | Freq: Every day | SUBCUTANEOUS | Status: DC
  Administered 2014-11-18 – 2014-11-19 (×2): 40 [IU] via SUBCUTANEOUS
  Filled 2014-11-18 (×3): qty 0.4

## 2014-11-18 MED ORDER — ETHYL CHLORIDE EX AERO: 1.0000 "application " | INHALATION_SPRAY | CUTANEOUS | Status: DC

## 2014-11-18 MED ORDER — COLCHICINE 0.6 MG PO TABS
0.6000 mg | ORAL_TABLET | Freq: Two times a day (BID) | ORAL | Status: DC
  Administered 2014-11-18 – 2014-11-19 (×4): 0.6 mg via ORAL
  Filled 2014-11-18 (×6): qty 1

## 2014-11-18 MED ORDER — SODIUM CHLORIDE 0.9 % IV SOLN
INTRAVENOUS | Status: DC
  Administered 2014-11-18: 5.4 [IU]/h via INTRAVENOUS
  Filled 2014-11-18: qty 2.5

## 2014-11-18 MED ORDER — DILTIAZEM HCL ER COATED BEADS 120 MG PO CP24
120.0000 mg | ORAL_CAPSULE | Freq: Every day | ORAL | Status: DC
  Filled 2014-11-18: qty 1

## 2014-11-18 MED ORDER — DILTIAZEM HCL ER COATED BEADS 120 MG PO CP24: 120.0000 mg | ORAL_CAPSULE | Freq: Every day | ORAL | Status: DC

## 2014-11-18 MED ORDER — SODIUM POLYSTYRENE SULFONATE 15 GM/60ML PO SUSP: 30.0000 g | Freq: Once | ORAL | Status: DC

## 2014-11-18 MED ORDER — METOPROLOL TARTRATE 25 MG PO TABS
25.0000 mg | ORAL_TABLET | Freq: Two times a day (BID) | ORAL | Status: DC
  Administered 2014-11-18 – 2014-11-23 (×10): 25 mg via ORAL
  Filled 2014-11-18 (×11): qty 1

## 2014-11-18 MED ORDER — NEPRO/CARBSTEADY PO LIQD
237.0000 mL | Freq: Two times a day (BID) | ORAL | Status: DC
  Administered 2014-11-20 – 2014-11-29 (×14): 237 mL via ORAL
  Filled 2014-11-18 (×5): qty 237

## 2014-11-18 MED ORDER — DEXTROSE-NACL 5-0.45 % IV SOLN: INTRAVENOUS | Status: DC

## 2014-11-18 MED ORDER — RENA-VITE PO TABS
1.0000 | ORAL_TABLET | Freq: Every day | ORAL | Status: DC
  Administered 2014-11-18 – 2014-12-01 (×14): 1 via ORAL
  Filled 2014-11-18 (×15): qty 1

## 2014-11-18 MED ORDER — INSULIN ASPART 100 UNIT/ML ~~LOC~~ SOLN: 0.0000 [IU] | Freq: Three times a day (TID) | SUBCUTANEOUS | Status: DC

## 2014-11-18 MED ORDER — VANCOMYCIN HCL 125 MG PO CAPS
250.0000 mg | ORAL_CAPSULE | Freq: Four times a day (QID) | ORAL | Status: DC
  Filled 2014-11-18 (×3): qty 2

## 2014-11-18 MED ORDER — CLOBETASOL PROPIONATE 0.05 % EX CREA
1.0000 "application " | TOPICAL_CREAM | Freq: Two times a day (BID) | CUTANEOUS | Status: DC
  Administered 2014-11-18 – 2014-11-25 (×12): 1 via TOPICAL
  Filled 2014-11-18 (×4): qty 15

## 2014-11-18 MED ORDER — METOCLOPRAMIDE HCL 5 MG/ML IJ SOLN
10.0000 mg | Freq: Once | INTRAMUSCULAR | Status: AC
  Administered 2014-11-18: 10 mg via INTRAVENOUS
  Filled 2014-11-18: qty 2

## 2014-11-18 MED ORDER — ATORVASTATIN CALCIUM 40 MG PO TABS
40.0000 mg | ORAL_TABLET | Freq: Every day | ORAL | Status: DC
  Administered 2014-11-18: 40 mg via ORAL
  Filled 2014-11-18 (×2): qty 1

## 2014-11-18 MED ORDER — SODIUM CHLORIDE 0.9 % IV SOLN
INTRAVENOUS | Status: DC
  Administered 2014-11-18: 15:00:00 via INTRAVENOUS

## 2014-11-18 MED ORDER — SACCHAROMYCES BOULARDII 250 MG PO CAPS
250.0000 mg | ORAL_CAPSULE | Freq: Two times a day (BID) | ORAL | Status: DC
  Administered 2014-11-18 – 2014-12-02 (×27): 250 mg via ORAL
  Filled 2014-11-18 (×25): qty 1

## 2014-11-18 MED ORDER — VANCOMYCIN 50 MG/ML ORAL SOLUTION
125.0000 mg | Freq: Four times a day (QID) | ORAL | Status: DC
  Filled 2014-11-18 (×4): qty 2.5

## 2014-11-18 MED ORDER — CETYLPYRIDINIUM CHLORIDE 0.05 % MT LIQD
7.0000 mL | OROMUCOSAL | Status: DC
  Administered 2014-11-18 – 2014-12-02 (×56): 7 mL via OROMUCOSAL

## 2014-11-18 MED ORDER — METOPROLOL TARTRATE 100 MG PO TABS
100.0000 mg | ORAL_TABLET | Freq: Every day | ORAL | Status: DC
  Filled 2014-11-18: qty 1

## 2014-11-18 MED ORDER — INSULIN ASPART 100 UNIT/ML ~~LOC~~ SOLN
0.0000 [IU] | Freq: Three times a day (TID) | SUBCUTANEOUS | Status: DC
  Administered 2014-11-18: 3 [IU] via SUBCUTANEOUS
  Administered 2014-11-19: 2 [IU] via SUBCUTANEOUS
  Administered 2014-11-19: 5 [IU] via SUBCUTANEOUS
  Administered 2014-11-20: 20 [IU] via SUBCUTANEOUS
  Administered 2014-11-20 (×2): 5 [IU] via SUBCUTANEOUS
  Administered 2014-11-22 – 2014-11-23 (×2): 1 [IU] via SUBCUTANEOUS
  Administered 2014-11-24: 7 [IU] via SUBCUTANEOUS
  Administered 2014-11-24: 9 [IU] via SUBCUTANEOUS
  Administered 2014-11-24: 1 [IU] via SUBCUTANEOUS

## 2014-11-18 MED ORDER — DILTIAZEM HCL 30 MG PO TABS
30.0000 mg | ORAL_TABLET | Freq: Four times a day (QID) | ORAL | Status: DC
  Administered 2014-11-18 – 2014-12-02 (×51): 30 mg via ORAL
  Filled 2014-11-18 (×58): qty 1

## 2014-11-18 MED ORDER — VANCOMYCIN 50 MG/ML ORAL SOLUTION
250.0000 mg | Freq: Four times a day (QID) | ORAL | Status: DC
  Administered 2014-11-18 (×3): 250 mg via ORAL
  Filled 2014-11-18 (×8): qty 5

## 2014-11-18 MED ORDER — WARFARIN - PHARMACIST DOSING INPATIENT
Freq: Every day | Status: DC
  Administered 2014-11-21: 18:00:00

## 2014-11-18 NOTE — ED Notes (Signed)
Per EMS, pt's wife stated that the patient is not his normal self.Wife noticed symptoms started yesterday.  Pt with hx of dialysis, diabetes, and a-fib. Per EMS, pt's CBG was reading hi on their monitor (greater than 600). Upon assessment patient able to answer most questions but is a little confused.

## 2014-11-18 NOTE — Consult Note (Addendum)
[facility] Renal Consultation Note    Indication for Consultation:  Management of ESRD/hemodialysis; anemia, hypertension/volume and secondary hyperparathyroidism PCP: Webb Silversmith, PA-[place]  HPI: Joseph [name] is a 59 y.o. male with ESRD ([facility])secondary to diabetes on HD since November 2015, afib, hx etoh presented to the ED with confusion, ^ blood sugars and nausea. .After dialysis Friday he was extremely thirsty and couldn't get enough to drink. He had arecent admission for Cdiff (d/c on on po Vanc) and volume excess with significant lowering of EDW by 7.5 kg last admission. IDWGs are modest about 2-3 kg - compliance with HD generally very good. His last treatment was 7/29 with net UF of 1.9 post weight of 80.8 and post BP 103/51 - no standing pressures- not cleary why - did have standing pressures before last hospital admission. CXR showed stable vascular congestion and pleural effusion, Head Ct neg acute change, NH3 wnl, Review of recent outpt labs 7/27 shows a new mild leukocytosis of 12.9 with 89% neutrophils and marked rise in Hgb A1c to 10. From 6.1 in April.  Monthly routine glucose levels had not been checked at the dialysis center. He is also on a new cream for his back rash and recently finished a medrol dose pack. His rash was biopsied in early July (results not available, though wife said the EDP thought the rash looked different that what the biopsy reported). His wife thinks the initial itching started in June and has become progressively worse.  She notes diffuse skin changes since starting the clobetasol cream. His wife states he hasn't been drinking for months.   Past Medical History  Diagnosis Date  . Atrial fibrillation     a. 11/18/2011 s/p DCCV - 150J;  b. Anticoagulation w/ Apixaban;  c. 11/2011 back in afib->asymptomatic.  . H/O alcohol abuse     a. drinks heavily on the weekends.  . Hypertension   . Diabetes mellitus   . Heart murmur     a. 09/2011 Echo:  EF 55-60%, Triv AI, Mild MR, mildly dil LA.  . Diabetic peripheral neuropathy   . CKD (chronic kidney disease), stage III   . CHF (congestive heart failure)   . Pneumonia   . Sleep apnea    Past Surgical History  Procedure Laterality Date  . Eye sx  11/11/2011    left eye  . Cardioversion  11/18/2011    Procedure: CARDIOVERSION;  Surgeon: Josue Hector, MD;  Location: [facility] ENDOSCOPY;  Service: Cardiovascular;  Laterality: N/A;  . Colonoscopy w/ biopsies and polypectomy    . Av fistula placement Left 02/13/2014    Procedure: INSERTION OF ARTERIOVENOUS (AV) GORE-TEX GRAFT ARM;  Surgeon: Angelia Mould, MD;  Location: [place];  Service: Vascular;  Laterality: Left;  . Right heart catheterization N/A 10/11/2013    Procedure: RIGHT HEART CATH;  Surgeon: Larey Dresser, MD;  Location: [facility] CATH LAB;  Service: Cardiovascular;  Laterality: N/A;   Family History  Problem Relation Age of Onset  . Heart disease Mother     before age 3  . Diabetes Father   . Diabetes Sister   . Peripheral vascular disease Sister     amputation  . Cancer Neg Hx   . Stroke Neg Hx    Social History:  reports that he quit smoking about 20 years ago. His smoking use included Cigarettes. He has a .05 pack-year smoking history. He has never used smokeless tobacco. He reports that he does not drink  alcohol or use illicit drugs. Allergies  Allergen Reactions  . Dilaudid [Hydromorphone] Nausea And Vomiting   Prior to Admission medications   Medication Sig Start Date End Date Taking? Authorizing Provider  atorvastatin (LIPITOR) 40 MG tablet Take 40 mg by mouth daily.   Yes Historical Provider, MD  b complex-vitamin c-folic acid (NEPHRO-VITE) 0.8 MG TABS tablet Take 1 tablet by mouth daily.   Yes Historical Provider, MD  clobetasol cream (TEMOVATE) 0.25 % Apply 1 application topically 2 (two) times daily. 11/01/14  Yes Historical Provider, MD  colchicine 0.6 MG tablet TAKE 1 TABLET (0.6 MG TOTAL) BY MOUTH 2 (TWO)  TIMES DAILY. 08/14/14  Yes Jearld Fenton, NP  diltiazem (CARDIZEM CD) 120 MG 24 hr capsule Take 1 capsule (120 mg total) by mouth daily. 03/03/14  Yes Donne Hazel, MD  ethyl chloride spray Apply 1 application topically every other day. Mon Wed Fri - done with dialysis 05/12/14  Yes Historical Provider, MD  LANTUS SOLOSTAR 100 UNIT/ML injection Inject 50 Units into the skin daily.  09/19/11  Yes Historical Provider, MD  metoprolol (LOPRESSOR) 100 MG tablet Take 1 tablet (100 mg total) by mouth at bedtime. Patient taking differently: Take 100 mg by mouth See admin instructions. Take twice daily except on dialysis days. 11/09/14  Yes Kelvin Cellar, MD  Nutritional Supplements (FEEDING SUPPLEMENT, NEPRO CARB STEADY,) LIQD Take 237 mLs by mouth 2 (two) times daily between meals. 11/09/14  Yes Kelvin Cellar, MD  ondansetron (ZOFRAN) 4 MG tablet Take 4 mg by mouth 3 (three) times daily as needed for nausea or vomiting.   Yes Historical Provider, MD  saccharomyces boulardii (FLORASTOR) 250 MG capsule Take 1 capsule (250 mg total) by mouth 2 (two) times daily. Patient taking differently: Take 250 mg by mouth daily.  11/09/14  Yes Kelvin Cellar, MD  vancomycin (VANCOCIN) 125 MG capsule Take 125 mg by mouth 4 (four) times daily. Take for 10 days starting 11/09/14   Yes Historical Provider, MD  warfarin (COUMADIN) 5 MG tablet Take 1 tablet (5 mg total) by mouth daily. 03/03/14  Yes Donne Hazel, MD  [facility] VERIO test strip  03/05/14   Historical Provider, MD  sildenafil (VIAGRA) 100 MG tablet Take 100 mg by mouth daily as needed for erectile dysfunction.    Historical Provider, MD   Current Facility-Administered Medications  Medication Dose Route Frequency Provider Last Rate Last Dose  . 0.9 %  sodium chloride infusion   Intravenous Continuous Melton Alar, PA-C      . atorvastatin (LIPITOR) tablet 40 mg  40 mg Oral Daily Melton Alar, PA-C      . clobetasol cream (TEMOVATE) 8.52 % 1 application   1 application Topical BID Melton Alar, PA-C      . colchicine tablet 0.6 mg  0.6 mg Oral BID Melton Alar, PA-C      . dextrose 5 %-0.45 % sodium chloride infusion   Intravenous Continuous Bobby Rumpf York, PA-C      . diltiazem (CARDIZEM CD) 24 hr capsule 120 mg  120 mg Oral Daily Marianne L York, PA-C      . feeding supplement (NEPRO CARB STEADY) liquid 237 mL  237 mL Oral BID BM Marianne L York, PA-C      . insulin regular (NOVOLIN R,HUMULIN R) 250 Units in sodium chloride 0.9 % 250 mL (1 Units/mL) infusion   Intravenous Continuous Jola Schmidt, MD 21.6 mL/hr at 11/18/14 1021 21.6 Units/hr at 11/18/14 1021  .  metoprolol (LOPRESSOR) tablet 100 mg  100 mg Oral QHS Melton Alar, PA-C      . multivitamin (RENA-VIT) tablet 1 tablet  1 tablet Oral QHS Bobby Rumpf York, PA-C      . saccharomyces boulardii (FLORASTOR) capsule 250 mg  250 mg Oral BID Melton Alar, PA-C      . sodium polystyrene (KAYEXALATE) 15 GM/60ML suspension 30 g  30 g Oral Once Jola Schmidt, MD      . vancomycin (VANCOCIN) 125 MG capsule 250 mg  250 mg Oral QID Melton Alar, PA-C       Labs: Basic Metabolic Panel:  Recent Labs Lab 11/15/14 1622 11/18/14 0422 11/18/14 1007  NA 133* 122* 130*  K 4.4 5.8* 3.7  CL 94* 84* 101  CO2 27 21* 19*  GLUCOSE 703* 1006* 678*  BUN 60* 77* 65*  CREATININE 4.61* 5.31* 4.29*  CALCIUM 8.7 8.3* 6.2*   Liver Function Tests:  Recent Labs Lab 11/15/14 1622 11/18/14 0422  AST 172* 135*  ALT 227* 232*  ALKPHOS 217* 206*  BILITOT 1.3* 1.5*  PROT 6.1 6.0*  ALBUMIN 3.4* 3.0*    Recent Labs Lab 11/18/14 0422  LIPASE 97*    Recent Labs Lab 11/18/14 0635  AMMONIA 35   CBC:  Recent Labs Lab 11/15/14 1622 11/18/14 0422  WBC 13.5* 17.9*  HGB 11.9* 12.4*  HCT 37.0* 38.1*  MCV 98.4 95.7  PLT 156.0 209   Lab Results  Component Value Date   INR 3.64* 11/18/2014   INR 2.4 11/15/2014   INR 1.47 11/09/2014      Recent Labs Lab 11/18/14 0355  11/18/14 0800 11/18/14 0911 11/18/14 1016  GLUCAP >600* >600* >600* >600*  Studies/Results: Dg Chest 2 View  11/18/2014   CLINICAL DATA:  Altered mental status. Shortness of breath and nausea today.  EXAM: CHEST  2 VIEW  COMPARISON:  11/05/2014  FINDINGS: Chronic cardiomegaly and vascular congestion, unchanged. Diminished right pleural effusion and basilar opacity from prior exam. No new confluent airspace disease or pneumothorax. No acute osseous abnormalities.  IMPRESSION: 1. Diminished right pleural effusion and right basilar opacity. 2. Stable chronic cardiomegaly and vascular congestion.   Electronically Signed   By: Jeb Levering M.D.   On: 11/18/2014 06:21   Ct Head Wo Contrast  11/18/2014   CLINICAL DATA:  Altered mental status.  EXAM: CT HEAD WITHOUT CONTRAST  TECHNIQUE: Contiguous axial images were obtained from the base of the skull through the vertex without intravenous contrast.  COMPARISON:  None.  FINDINGS: No intracranial hemorrhage, mass effect, or midline shift. Small focal encephalomalacia in the left occipital lobe. No hydrocephalus. The basilar cisterns are patent. No evidence of territorial infarct. No intracranial fluid collection. Calvarium is intact. Included paranasal sinuses and mastoid air cells are well aerated.  IMPRESSION: 1.  No acute intracranial abnormality. 2. Small focal encephalomalacia in the left occipital lobe.   Electronically Signed   By: Jeb Levering M.D.   On: 11/18/2014 06:13    ROS: Limited by confusion, denies SOB, pain.  Has had some diarrhea. Back itches terribly. Can't get comfortable.  Physical Exam: Filed Vitals:   11/18/14 0900 11/18/14 0917 11/18/14 0945 11/18/14 1000  BP:  111/75 117/68 100/56  Pulse: 73 77  81  Temp:      TempSrc:      Resp:  27  15  SpO2: 100% 100%  100%     General:  AAM uncomfortable, restless Head: Normocephalic, atraumatic, sclera  non-icteric, mucus membranes are moist Neck: Supple. + JVD  Lungs:  Grossly  clear,Breathing is unlabored. Heart: irreg irreg, rates ~100+/- Back: diffuse erythema, with thickening of skin, mutiple lesions of on back, with smaller ones on arms and legs Abdomen: Soft, non-tender, non-distended with normoactive bowel sounds. No rebound/guarding. No ascites  M-S:  Strength and tone appear normal for age. Lower extremities: 1-2++ LE edema or ischemic changes, Neuro: Fidgity. Moves all extremities spontaneously, cannot tell me his age, birthdate, tell me which of the four women in the room was his wife. Dialysis Access:left upper AVGG + bruit  Dialysis Orders: [facility] 180 EDW 80 3 K 2.5 Ca 450/800 profile 2 AVGG heparin 6100 no Fe or ESA calcitriol 1.75 Recent labs; 7/27 Hgb 11.6 WBC ^ at 12.9 89% N ferritin 1122 42% sat Ca/P ok iPTH 427 stable hgb A1C 10.7 up from 6.1 in April and 7.4 in January (monthly glu labs have NOT been ordered - had notified his kidney center to do so  Assessment/Plan: 1. DKA - Hgb A1C 10.7 with monthly labs - previously 6.1 in April - per primary 2. Fever/leukocytosis - Blood and urine cultures pending Cdiff + 7/19- per primary 3. AMS - likely related to infection/hyperglycemia 4. ESRD -  MWF - plan HD Monday K 5.8 related to hyperglycemia - coming down - may get lower as BS comes down further 5. Hypertension/volume  -  BP controlled with MTP/cardizem for afib, but still has volume - low BP may limit volume removal; titrate down as able; volume up but not symptomatic - ok to wait until first round HD on Monday  6. Anemia  - no indication for ESA or Fe at present 7. Metabolic bone disease -  Ca unusually low at 6.2 down from 8.3??; , continue calcitriol 8. Nutrition - NPO at present will need renal carb mod diet 9. Cdiff - from prior admission - due to finish po Vanc - per Dr. Linus Salmons - plan to contineu 10. Back rash - follwed by derm - has had itching problems for several months per outpt notes 76. ^ LFTs - also noted last admission - I  would have expected improvement with lower of EDW last admission; these are not being followed at his dialysis unit, so no recent labs for comparison; continue to lower volume - [place] last admission neg; will hold statin for now 12. Pulmonary hypertension - lower volume  13. Skin rash - per primary - wife said she would bring path report tomorrow 58. Afib- on BB and CCB, coumadin per [place], PA-C [place] [phone] 11/18/2014, 12:19 PM   Pt seen, examined and agree w A/P as above.  Kelly Splinter MD pager [phone]    cell [phone] 11/18/2014, 8:27 PM       I have seen and examined this patient and agree with plan as outlined above. Alastor Kneale A,MD 11/19/2014 8:57 AM

## 2014-11-18 NOTE — Progress Notes (Signed)
ANTICOAGULATION CONSULT NOTE - Initial Consult  Pharmacy Consult for warfarin Indication: atrial fibrillation  Allergies  Allergen Reactions  . Dilaudid [Hydromorphone] Nausea And Vomiting    Patient Measurements:    Vital Signs: Temp: 98.3 F (36.8 C) (07/31 0819) Temp Source: Oral (07/31 0819) BP: 100/56 mmHg (07/31 1000) Pulse Rate: 81 (07/31 1000)  Labs:  Recent Labs  11/15/14 1341 11/15/14 1622 11/18/14 0422 11/18/14 1007  HGB  --  11.9* 12.4*  --   HCT  --  37.0* 38.1*  --   PLT  --  156.0 209  --   LABPROT  --   --  35.4*  --   INR 2.4  --  3.64*  --   CREATININE  --  4.61* 5.31* 4.29*    Estimated Creatinine Clearance: 20.3 mL/min (by C-G formula based on Cr of 4.29).   Medical History: Past Medical History  Diagnosis Date  . Atrial fibrillation     a. 11/18/2011 s/p DCCV - 150J;  b. Anticoagulation w/ Apixaban;  c. 11/2011 back in afib->asymptomatic.  . H/O alcohol abuse     a. drinks heavily on the weekends.  . Hypertension   . Diabetes mellitus   . Heart murmur     a. 09/2011 Echo: EF 55-60%, Triv AI, Mild MR, mildly dil LA.  . Diabetic peripheral neuropathy   . CKD (chronic kidney disease), stage III   . CHF (congestive heart failure)   . Pneumonia   . Sleep apnea     Medications:  Prescriptions prior to admission  Medication Sig Dispense Refill Last Dose  . atorvastatin (LIPITOR) 40 MG tablet Take 40 mg by mouth daily.   11/17/2014 at Unknown time  . b complex-vitamin c-folic acid (NEPHRO-VITE) 0.8 MG TABS tablet Take 1 tablet by mouth daily.   11/17/2014 at Unknown time  . clobetasol cream (TEMOVATE) 8.88 % Apply 1 application topically 2 (two) times daily.  2 11/17/2014 at Unknown time  . colchicine 0.6 MG tablet TAKE 1 TABLET (0.6 MG TOTAL) BY MOUTH 2 (TWO) TIMES DAILY. 60 tablet 2 11/17/2014 at Unknown time  . diltiazem (CARDIZEM CD) 120 MG 24 hr capsule Take 1 capsule (120 mg total) by mouth daily. 30 capsule 0 11/17/2014 at Unknown time  .  ethyl chloride spray Apply 1 application topically every other day. Mon Wed Fri - done with dialysis  6 Past Week at Unknown time  . LANTUS SOLOSTAR 100 UNIT/ML injection Inject 50 Units into the skin daily.    11/17/2014 at Unknown time  . metoprolol (LOPRESSOR) 100 MG tablet Take 1 tablet (100 mg total) by mouth at bedtime. (Patient taking differently: Take 100 mg by mouth See admin instructions. Take twice daily except on dialysis days.) 30 tablet 1 11/17/2014 at 1930  . Nutritional Supplements (FEEDING SUPPLEMENT, NEPRO CARB STEADY,) LIQD Take 237 mLs by mouth 2 (two) times daily between meals. 20 Can 0 Past Month at Unknown time  . ondansetron (ZOFRAN) 4 MG tablet Take 4 mg by mouth 3 (three) times daily as needed for nausea or vomiting.   11/17/2014 at Unknown time  . saccharomyces boulardii (FLORASTOR) 250 MG capsule Take 1 capsule (250 mg total) by mouth 2 (two) times daily. (Patient taking differently: Take 250 mg by mouth daily. ) 30 capsule 0 11/17/2014 at Unknown time  . vancomycin (VANCOCIN) 125 MG capsule Take 125 mg by mouth 4 (four) times daily. Take for 10 days starting 11/09/14   11/17/2014 at Unknown time  .  warfarin (COUMADIN) 5 MG tablet Take 1 tablet (5 mg total) by mouth daily. 30 tablet 0 11/17/2014 at Unknown time  . ONETOUCH VERIO test strip    other  . sildenafil (VIAGRA) 100 MG tablet Take 100 mg by mouth daily as needed for erectile dysfunction.   unknown    Assessment: 81 yom presented to the ED with AMS and DKA. He is chronically anticoagulated on warfarin for history of afib. INR is supratherapeutic at 3.64 today. H/H slightly low and platelets are WNL. No bleeding noted.    Goal of Therapy:  INR 2-3 Monitor platelets by anticoagulation protocol: Yes   Plan:  - No warfarin today - Daily INR  Joseph [name], Joseph [name] 11/18/2014,12:55 PM

## 2014-11-18 NOTE — Progress Notes (Signed)
UR COMPLETED  

## 2014-11-18 NOTE — ED Provider Notes (Addendum)
CSN: [id number]     Arrival date & time 11/18/14  8527 History   First MD Initiated Contact with Patient 11/18/14 0401     Chief Complaint  Patient presents with  . Altered Mental Status      HPI Patient is brought to the emergency department by his wife for elevated blood sugar and increased confusion today.  Patient states he has no complaints at this time.  Patient denies chest pain shortness of breath.  He denies abdominal pain.  Wife reports he was having some nausea and vomiting without diarrhea today.  No headache.  Dialysis patient when it was a Friday with last dialysis on Friday.  Patient also does have a history of atrial fibrillation.  Wife reports he no longer drinks ETOH  Past Medical History  Diagnosis Date  . Atrial fibrillation     a. 11/18/2011 s/p DCCV - 150J;  b. Anticoagulation w/ Apixaban;  c. 11/2011 back in afib->asymptomatic.  . H/O alcohol abuse     a. drinks heavily on the weekends.  . Hypertension   . Diabetes mellitus   . Heart murmur     a. 09/2011 Echo: EF 55-60%, Triv AI, Mild MR, mildly dil LA.  . Diabetic peripheral neuropathy   . CKD (chronic kidney disease), stage III   . CHF (congestive heart failure)   . Pneumonia   . Sleep apnea    Past Surgical History  Procedure Laterality Date  . Eye sx  11/11/2011    left eye  . Cardioversion  11/18/2011    Procedure: CARDIOVERSION;  Surgeon: Josue Hector, MD;  Location: [facility] ENDOSCOPY;  Service: Cardiovascular;  Laterality: N/A;  . Colonoscopy w/ biopsies and polypectomy    . Av fistula placement Left 02/13/2014    Procedure: INSERTION OF ARTERIOVENOUS (AV) GORE-TEX GRAFT ARM;  Surgeon: Angelia Mould, MD;  Location: Echo;  Service: Vascular;  Laterality: Left;  . Right heart catheterization N/A 10/11/2013    Procedure: RIGHT HEART CATH;  Surgeon: Larey Dresser, MD;  Location: [facility] CATH LAB;  Service: Cardiovascular;  Laterality: N/A;   Family History  Problem Relation Age of Onset  . Heart  disease Mother     before age 61  . Diabetes Father   . Diabetes Sister   . Peripheral vascular disease Sister     amputation  . Cancer Neg Hx   . Stroke Neg Hx    History  Substance Use Topics  . Smoking status: Former Smoker -- 0.01 packs/day for 5 years    Types: Cigarettes    Quit date: 04/20/1994  . Smokeless tobacco: Never Used     Comment: some in college  . Alcohol Use: No    Review of Systems  All other systems reviewed and are negative.     Allergies  Dilaudid  Home Medications   Prior to Admission medications   Medication Sig Start Date End Date Taking? Authorizing Provider  atorvastatin (LIPITOR) 40 MG tablet Take 40 mg by mouth daily.   Yes Historical Provider, MD  b complex-vitamin c-folic acid (NEPHRO-VITE) 0.8 MG TABS tablet Take 1 tablet by mouth daily.   Yes Historical Provider, MD  clobetasol cream (TEMOVATE) 7.82 % Apply 1 application topically 2 (two) times daily. 11/01/14  Yes Historical Provider, MD  colchicine 0.6 MG tablet TAKE 1 TABLET (0.6 MG TOTAL) BY MOUTH 2 (TWO) TIMES DAILY. 08/14/14  Yes Jearld Fenton, NP  diazepam (VALIUM) 5 MG tablet Take 1 tablet (5  mg total) by mouth every 6 (six) hours as needed for sedation (itching). 10/28/14  Yes Debby Freiberg, MD  diltiazem (CARDIZEM CD) 120 MG 24 hr capsule Take 1 capsule (120 mg total) by mouth daily. 03/03/14  Yes Donne Hazel, MD  ethyl chloride spray Apply 1 application topically every other day. Mon Wed Fri - done with dialysis 05/12/14  Yes Historical Provider, MD  LANTUS SOLOSTAR 100 UNIT/ML injection Inject 50 Units into the skin daily.  09/19/11  Yes Historical Provider, MD  metoprolol (LOPRESSOR) 100 MG tablet Take 1 tablet (100 mg total) by mouth at bedtime. Patient taking differently: Take 100 mg by mouth See admin instructions. Take twice daily except on dialysis days. 11/09/14  Yes Kelvin Cellar, MD  Nutritional Supplements (FEEDING SUPPLEMENT, NEPRO CARB STEADY,) LIQD Take 237 mLs by  mouth 2 (two) times daily between meals. 11/09/14  Yes Kelvin Cellar, MD  ondansetron (ZOFRAN) 4 MG tablet Take 4 mg by mouth 3 (three) times daily as needed for nausea or vomiting.   Yes Historical Provider, MD  predniSONE (DELTASONE) 5 MG tablet Take 30 mg by mouth daily with breakfast.   Yes Historical Provider, MD  saccharomyces boulardii (FLORASTOR) 250 MG capsule Take 1 capsule (250 mg total) by mouth 2 (two) times daily. Patient taking differently: Take 250 mg by mouth daily.  11/09/14  Yes Kelvin Cellar, MD  vancomycin (VANCOCIN) 125 MG capsule Take 125 mg by mouth 4 (four) times daily. Take for 10 days starting 11/09/14   Yes Historical Provider, MD  warfarin (COUMADIN) 5 MG tablet Take 1 tablet (5 mg total) by mouth daily. 03/03/14  Yes Donne Hazel, MD  [facility] VERIO test strip  03/05/14   Historical Provider, MD  sildenafil (VIAGRA) 100 MG tablet Take 100 mg by mouth daily as needed for erectile dysfunction.    Historical Provider, MD   BP 109/69 mmHg  Pulse 79  Temp(Src) 98.6 F (37 C) (Oral)  SpO2 99% Physical Exam  Constitutional: He is oriented to person, place, and time. He appears well-developed and well-nourished.  HENT:  Head: Normocephalic and atraumatic.  Eyes: EOM are normal.  Neck: Normal range of motion.  Cardiovascular: Normal rate, regular rhythm, normal heart sounds and intact distal pulses.   Pulmonary/Chest: Effort normal and breath sounds normal. No respiratory distress.  Abdominal: Soft. He exhibits no distension. There is no tenderness.  Musculoskeletal: Normal range of motion.  Neurological: He is alert and oriented to person, place, and time.  Skin: Skin is warm and dry.  Psychiatric: He has a normal mood and affect. Judgment normal.  Nursing note and vitals reviewed.   ED Course  Procedures (including critical care time) Labs Review Labs Reviewed  COMPREHENSIVE METABOLIC PANEL - Abnormal; Notable for the following:    Sodium 122 (*)     Potassium 5.8 (*)    Chloride 84 (*)    CO2 21 (*)    Glucose, Bld 1006 (*)    BUN 77 (*)    Creatinine, Ser 5.31 (*)    Calcium 8.3 (*)    Total Protein 6.0 (*)    Albumin 3.0 (*)    AST 135 (*)    ALT 232 (*)    Alkaline Phosphatase 206 (*)    Total Bilirubin 1.5 (*)    GFR calc non Af Amer 11 (*)    GFR calc Af Amer 12 (*)    Anion gap 17 (*)    All other components within normal  limits  CBC - Abnormal; Notable for the following:    WBC 17.9 (*)    RBC 3.98 (*)    Hemoglobin 12.4 (*)    HCT 38.1 (*)    RDW 17.8 (*)    All other components within normal limits  PROTIME-INR - Abnormal; Notable for the following:    Prothrombin Time 35.4 (*)    INR 3.64 (*)    All other components within normal limits  ETHANOL - Abnormal; Notable for the following:    Alcohol, Ethyl (B) 6 (*)    All other components within normal limits  LIPASE, BLOOD - Abnormal; Notable for the following:    Lipase 97 (*)    All other components within normal limits  CBG MONITORING, ED - Abnormal; Notable for the following:    Glucose-Capillary >600 (*)    All other components within normal limits  CULTURE, BLOOD (ROUTINE X 2)  CULTURE, BLOOD (ROUTINE X 2)  URINE CULTURE  AMMONIA  URINALYSIS, ROUTINE W REFLEX MICROSCOPIC (NOT AT [facility])  LACTIC ACID, PLASMA  URINE RAPID DRUG SCREEN, HOSP PERFORMED  CBG MONITORING, ED    Imaging Review Dg Chest 2 View  11/18/2014   CLINICAL DATA:  Altered mental status. Shortness of breath and nausea today.  EXAM: CHEST  2 VIEW  COMPARISON:  11/05/2014  FINDINGS: Chronic cardiomegaly and vascular congestion, unchanged. Diminished right pleural effusion and basilar opacity from prior exam. No new confluent airspace disease or pneumothorax. No acute osseous abnormalities.  IMPRESSION: 1. Diminished right pleural effusion and right basilar opacity. 2. Stable chronic cardiomegaly and vascular congestion.   Electronically Signed   By: Jeb Levering M.D.   On: 11/18/2014  06:21   Ct Head Wo Contrast  11/18/2014   CLINICAL DATA:  Altered mental status.  EXAM: CT HEAD WITHOUT CONTRAST  TECHNIQUE: Contiguous axial images were obtained from the base of the skull through the vertex without intravenous contrast.  COMPARISON:  None.  FINDINGS: No intracranial hemorrhage, mass effect, or midline shift. Small focal encephalomalacia in the left occipital lobe. No hydrocephalus. The basilar cisterns are patent. No evidence of territorial infarct. No intracranial fluid collection. Calvarium is intact. Included paranasal sinuses and mastoid air cells are well aerated.  IMPRESSION: 1.  No acute intracranial abnormality. 2. Small focal encephalomalacia in the left occipital lobe.   Electronically Signed   By: Jeb Levering M.D.   On: 11/18/2014 06:13  I personally reviewed the imaging tests through PACS system I reviewed available ER/hospitalization records through the EMR    EKG Interpretation   Date/Time:  Sunday November 18 2014 03:49:43 EDT Ventricular Rate:  77 PR Interval:    QRS Duration: 88 QT Interval:  388 QTC Calculation: 439 R Axis:   93 Text Interpretation:  Atrial fibrillation Paired ventricular premature  complexes Borderline right axis deviation Probable LVH with secondary  repol abnrm No significant change was found Confirmed by Vauda Salvucci  MD, Izreal Kock  ([id number]) on 11/18/2014 5:25:21 AM      MDM   Final diagnoses:  None    Hyperglycemia, urine pending. Will obtain in and out cath. Will start on insulin drip given blood sugar >1000. Abdominal exam benign   Corrected sodium is [place], MD 11/18/14 [place], MD 11/18/14 [phone]

## 2014-11-18 NOTE — H&P (Signed)
[facility] History and Physical                                                                                    Joseph [name], is a 59 y.o. male  MRN: [id number]   DOB - 01/18/1956  Admit Date - 11/18/2014  Outpatient Primary MD for the patient is Webb Silversmith, NP  Referring Physician:  Dr. Venora Maples  Chief Complaint:   Chief Complaint  Patient presents with  . Altered Mental Status     HPI  Joseph [name]  is a 59 y.o. male, with end-stage renal disease (HD on M/W/F), C. difficile (actively being treated), A. fib, diabetes mellitus, and obstructive sleep apnea who was brought to the emergency department by his wife for confusion, elevated CBGs and nausea. The patient is lethargic and confused and unable to give history. History was collected from his wife.  He was [facility] from [facility] on [facility] after being treated for C. difficile and volume overload. He was supposed to finish his oral vancomycin on 8/1.  The patient's stools were improving but over the last few days have become more frequent and soft again. Yesterday he had 4 stools and was unable to make it to the toilet in time. Also over the last 2 days he has become very confused and sleepy. He told his wife yesterday that he was speaking with his mother, but he has not seen her in 2 weeks. Also he repeatedly asked her where the dog was and then stated he was talking to the dog-but they gave the dog away last week.  His wife has noticed elevated CBGs. She is uncertain if the patient has been taking his insulin correctly since he has been confused. Yesterday she stopped him from taking his Lantus a second time as he had forgotten that he already took it.  Of note, the patient has been suffering with a puritic rash. He has seen Dr. Allyson Sabal (dermatology) outpatient. He finished a course of prednisone on Wednesday and has been using clobetasol cream. Unfortunately it appears the rash has spread from his back to his abdomen, arms, and  legs.  In the ER the patient is calm but lethargic and very confused. He thinks it's 1997, and that we are in [place]. He is oriented to person.  CBG is 1006, anion gap 17, sodium 122, k 5.8, WBC 17.9.  CT scan of his head is negative for acute abnormalities.  Review of Systems   Patient is lethargic and confused and cannot give a review of systems  Past Medical History  Past Medical History  Diagnosis Date  . Atrial fibrillation     a. 11/18/2011 s/p DCCV - 150J;  b. Anticoagulation w/ Apixaban;  c. 11/2011 back in afib->asymptomatic.  . H/O alcohol abuse     a. drinks heavily on the weekends.  . Hypertension   . Diabetes mellitus   . Heart murmur     a. 09/2011 Echo: EF 55-60%, Triv AI, Mild MR, mildly dil LA.  . Diabetic peripheral neuropathy   . CKD (chronic kidney disease), stage III   . CHF (congestive heart failure)   . Pneumonia   .  Sleep apnea     Past Surgical History  Procedure Laterality Date  . Eye sx  11/11/2011    left eye  . Cardioversion  11/18/2011    Procedure: CARDIOVERSION;  Surgeon: Josue Hector, MD;  Location: [facility] ENDOSCOPY;  Service: Cardiovascular;  Laterality: N/A;  . Colonoscopy w/ biopsies and polypectomy    . Av fistula placement Left 02/13/2014    Procedure: INSERTION OF ARTERIOVENOUS (AV) GORE-TEX GRAFT ARM;  Surgeon: Angelia Mould, MD;  Location: [place];  Service: Vascular;  Laterality: Left;  . Right heart catheterization N/A 10/11/2013    Procedure: RIGHT HEART CATH;  Surgeon: Larey Dresser, MD;  Location: [facility] CATH LAB;  Service: Cardiovascular;  Laterality: N/A;      Social History History  Substance Use Topics  . Smoking status: Former Smoker -- 0.01 packs/day for 5 years    Types: Cigarettes    Quit date: 04/20/1994  . Smokeless tobacco: Never Used     Comment: some in college  . Alcohol Use: No   lives at home with his wife. Is normally independent with ADLs.  Family History Family History  Problem Relation Age of  Onset  . Heart disease Mother     before age 29  . Diabetes Father   . Diabetes Sister   . Peripheral vascular disease Sister     amputation  . Cancer Neg Hx   . Stroke Neg Hx     Prior to Admission medications   Medication Sig Start Date End Date Taking? Authorizing Provider  atorvastatin (LIPITOR) 40 MG tablet Take 40 mg by mouth daily.   Yes Historical Provider, MD  b complex-vitamin c-folic acid (NEPHRO-VITE) 0.8 MG TABS tablet Take 1 tablet by mouth daily.   Yes Historical Provider, MD  clobetasol cream (TEMOVATE) 4.40 % Apply 1 application topically 2 (two) times daily. 11/01/14  Yes Historical Provider, MD  colchicine 0.6 MG tablet TAKE 1 TABLET (0.6 MG TOTAL) BY MOUTH 2 (TWO) TIMES DAILY. 08/14/14  Yes Jearld Fenton, NP  diltiazem (CARDIZEM CD) 120 MG 24 hr capsule Take 1 capsule (120 mg total) by mouth daily. 03/03/14  Yes Donne Hazel, MD  ethyl chloride spray Apply 1 application topically every other day. Mon Wed Fri - done with dialysis 05/12/14  Yes Historical Provider, MD  LANTUS SOLOSTAR 100 UNIT/ML injection Inject 50 Units into the skin daily.  09/19/11  Yes Historical Provider, MD  metoprolol (LOPRESSOR) 100 MG tablet Take 1 tablet (100 mg total) by mouth at bedtime. Patient taking differently: Take 100 mg by mouth See admin instructions. Take twice daily except on dialysis days. 11/09/14  Yes Kelvin Cellar, MD  Nutritional Supplements (FEEDING SUPPLEMENT, NEPRO CARB STEADY,) LIQD Take 237 mLs by mouth 2 (two) times daily between meals. 11/09/14  Yes Kelvin Cellar, MD  ondansetron (ZOFRAN) 4 MG tablet Take 4 mg by mouth 3 (three) times daily as needed for nausea or vomiting.   Yes Historical Provider, MD  saccharomyces boulardii (FLORASTOR) 250 MG capsule Take 1 capsule (250 mg total) by mouth 2 (two) times daily. Patient taking differently: Take 250 mg by mouth daily.  11/09/14  Yes Kelvin Cellar, MD  vancomycin (VANCOCIN) 125 MG capsule Take 125 mg by mouth 4 (four)  times daily. Take for 10 days starting 11/09/14   Yes Historical Provider, MD  warfarin (COUMADIN) 5 MG tablet Take 1 tablet (5 mg total) by mouth daily. 03/03/14  Yes Donne Hazel, MD  [facility] VERIO  test strip  03/05/14   Historical Provider, MD  sildenafil (VIAGRA) 100 MG tablet Take 100 mg by mouth daily as needed for erectile dysfunction.    Historical Provider, MD    Allergies  Allergen Reactions  . Dilaudid [Hydromorphone] Nausea And Vomiting    Physical Exam  Vitals  Blood pressure 99/62, pulse 76, temperature 98.3 F (36.8 C), temperature source Oral, resp. rate 13, SpO2 98 %.   General:  Lethargic, confused, pleasant, lying in bed in NAD, wife at bedside  Psych: Calm, cooperative, confused  Neuro:   No F.N deficits  ENT:  Ears and Eyes appear Normal, Conjunctivae clear, PER. Dry mucous membranes.  Neck:  Supple, No lymphadenopathy appreciated  Respiratory:  Symmetrical chest wall movement, Good air movement bilaterally, CTAB.  Cardiac:  Irregularly irregular, + + JVD, 2+ pitting edema in his lower extremities bilaterally  Abdomen: Decreased bowel sounds, multiple hernias noted (umbilicus and both flanks), soft, nontender  Skin: Rash consisting of groups of annular pustules some of which have dried. Primarily on his back but spread to her legs, arms and abdomen is evident   Extremities:  Able to move all 4. 5/5 strength in each,  no effusions.  Data Review  CBC  Recent Labs Lab 11/15/14 1622 11/18/14 0422  WBC 13.5* 17.9*  HGB 11.9* 12.4*  HCT 37.0* 38.1*  PLT 156.0 209  MCV 98.4 95.7  MCH  --  31.2  MCHC 32.2 32.5  RDW 18.8* 17.8*    Chemistries   Recent Labs Lab 11/15/14 1622 11/18/14 0422  NA 133* 122*  K 4.4 5.8*  CL 94* 84*  CO2 27 21*  GLUCOSE 703* 1006*  BUN 60* 77*  CREATININE 4.61* 5.31*  CALCIUM 8.7 8.3*  AST 172* 135*  ALT 227* 232*  ALKPHOS 217* 206*  BILITOT 1.3* 1.5*    Coagulation profile  Recent Labs Lab  11/15/14 1341 11/18/14 0422  INR 2.4 3.64*    Imaging results:   Dg Chest 2 View  11/18/2014   CLINICAL DATA:  Altered mental status. Shortness of breath and nausea today.  EXAM: CHEST  2 VIEW  COMPARISON:  11/05/2014  FINDINGS: Chronic cardiomegaly and vascular congestion, unchanged. Diminished right pleural effusion and basilar opacity from prior exam. No new confluent airspace disease or pneumothorax. No acute osseous abnormalities.  IMPRESSION: 1. Diminished right pleural effusion and right basilar opacity. 2. Stable chronic cardiomegaly and vascular congestion.   Electronically Signed   By: Jeb Levering M.D.   On: 11/18/2014 06:21   Dg Chest 2 View  11/05/2014   CLINICAL DATA:  Dyspnea for 1 day  EXAM: CHEST  2 VIEW  COMPARISON:  July 03, 2014  FINDINGS: The heart size and mediastinal contours are stable. There is mild patchy consolidation of lateral right lung base with small right pleural effusion. There is mild chronic central pulmonary vascular congestion. The visualized skeletal structures are stable.  IMPRESSION: Chronic central pulmonary vascular congestion.  Small right pleural effusion with associated mild patchy opacity of right lung base which could be due to atelectasis but superimposed pneumonia is not excluded.   Electronically Signed   By: Abelardo Diesel M.D.   On: 11/05/2014 21:54   Ct Head Wo Contrast  11/18/2014   CLINICAL DATA:  Altered mental status.  EXAM: CT HEAD WITHOUT CONTRAST  TECHNIQUE: Contiguous axial images were obtained from the base of the skull through the vertex without intravenous contrast.  COMPARISON:  None.  FINDINGS: No intracranial hemorrhage,  mass effect, or midline shift. Small focal encephalomalacia in the left occipital lobe. No hydrocephalus. The basilar cisterns are patent. No evidence of territorial infarct. No intracranial fluid collection. Calvarium is intact. Included paranasal sinuses and mastoid air cells are well aerated.  IMPRESSION: 1.   No acute intracranial abnormality. 2. Small focal encephalomalacia in the left occipital lobe.   Electronically Signed   By: Jeb Levering M.D.   On: 11/18/2014 06:13   US Abdomen Complete  11/06/2014   CLINICAL DATA:  Abnormal liver function tests  EXAM: ULTRASOUND ABDOMEN COMPLETE  COMPARISON:  None.  FINDINGS: Gallbladder: No gallstones or wall thickening visualized. No sonographic Murphy sign noted.  Common bile duct: Diameter: 4 mm  Liver: Minimally nodular contour with no focal abnormalities.  IVC: No abnormality visualized.  Pancreas: Visualized portion unremarkable.  Spleen: Size and appearance within normal limits.  Right Kidney: Length: 10.7 cm. Midpole cyst measuring 14 mm. Mildly increased echogenicity.  Left Kidney: Length: 10.8 cm. Lower pole cyst measures 1 cm. Mildly increased echogenicity.  Abdominal aorta: No aneurysm identified. Limited visualization distally due to bowel gas.  Other findings: Right pleural effusion.  Small volume ascites.  IMPRESSION: Mildly nodular liver contour with small volume of ascites and right pleural effusion. Possibility of cirrhosis not excluded.  Evidence of medical renal disease.   Electronically Signed   By: Skipper Cliche M.D.   On: 11/06/2014 21:38    My personal review of EKG: Atrial fibrillation   Assessment & Plan  Principal Problem:   Delirium Active Problems:   Atrial fibrillation-permanent   Obstructive sleep apnea   Pulmonary hypertension   Rash   Volume overload   ESRD on hemodialysis   Supratherapeutic INR   C. difficile diarrhea   DKA, type 2   Hyperkalemia  Delirium Possibly multifactorial.  Could be a combination of DKA, C. difficile infection uremia, medications. Ammonia level is normal.  CT head is negative for acute abnormality. Will admit to step down for close monitoring. If his altered mental status does not improve consider a biopsy of the rash  DKA in the setting of type 2 diabetes Anion gap 17, CO2  21. Suspect this is due to infection and inappropriate insulin dosing due to altered mental status. Glucose stabilizer started in the emergency department. Will attempt to minimize fluids with Glucomander. I will plan to check a hemoglobin A1c and restart his normal insulin regimen once his gap closes.  C. difficile infection Positive C. difficile PCR last admission. Was supposed to finish vancomycin on 8/1 Discussed with Dr. Linus Salmons of infectious disease on the telephone. He recommended continuation of oral vancomycin. I will increase the dose to 250 mg 4 times a day. Enteric precautions  Volume overload in the setting of end-stage renal disease Have consulted nephrology.  Patient normally dialyzes M/W/F, but may need dialysis today. Patient's previous dry weight was 87 kg.  Reportedly this was lowered by Dr. Joelyn Oms at discharge (7/18)  Severe pulmonary hypertension Transthoracic echocardiogram performed at [facility] on 08/21/2014: severe pulmonary hypertension. From D/C summary 7/18:  Will follow up with Dr Aundra Dubin of cardiology in the office  Hyperkalemia (5.8) Kayexalate ordered Nephrology consulted for possible dialysis today  Rash Over 2 weeks in duration. Being worked up outpatient by Dr. Allyson Sabal. Will continue clobetasol cream.  Patient finished prednisone on Wednesday. This does not appear to be shingles. No need for negative pressure room. Would consider punch biopsy by [facility] if patient's overall status does not  improve.  Atrial fibrillation Rate controlled. On Coumadin. INR currently supratherapeutic Coumadin per pharmacy.  Obstructive sleep apnea Place on oxygen when necessary. Patient is awaiting sleep study for C Pap.  Elevated LFTs Felt to be due to hepatic congestion from volume overload. Given altered mental status I will check an acute hepatitis panel  Consultants Called:  Nephrology  Family Communication:   Wife at bedside  Code Status:  Full  code  Condition:  Guarded  Potential Disposition: To home when mental status has improved and okay with nephrology.  Time spent in minutes : [address],  PA-C on 11/18/2014 at 9:03 AM Between 7am to 7pm - Pager - [phone] After 7pm go to [email or web address] - password [email or web address] And look for the night coverage person covering me after hours  [facility]

## 2014-11-18 NOTE — ED Notes (Signed)
Attempted report 

## 2014-11-18 NOTE — ED Notes (Signed)
Lab called critical glucose value of 1006

## 2014-11-19 ENCOUNTER — Telehealth: Payer: Self-pay

## 2014-11-19 ENCOUNTER — Ambulatory Visit: Payer: 59 | Admitting: Physician Assistant

## 2014-11-19 LAB — URINE MICROSCOPIC-ADD ON

## 2014-11-19 LAB — CBC
HCT: 38.1 % — ABNORMAL LOW (ref 39.0–52.0)
HEMATOCRIT: 39.8 % (ref 39.0–52.0)
HEMOGLOBIN: 13.2 g/dL (ref 13.0–17.0)
HEMOGLOBIN: 12.7 g/dL — AB (ref 13.0–17.0)
MCH: 31.8 pg (ref 26.0–34.0)
MCH: 31.2 pg (ref 26.0–34.0)
MCHC: 33.2 g/dL (ref 30.0–36.0)
MCHC: 33.3 g/dL (ref 30.0–36.0)
MCV: 95.9 fL (ref 78.0–100.0)
MCV: 93.6 fL (ref 78.0–100.0)
PLATELETS: 229 10*3/uL (ref 150–400)
Platelets: 253 10*3/uL (ref 150–400)
RBC: 4.15 MIL/uL — ABNORMAL LOW (ref 4.22–5.81)
RBC: 4.07 MIL/uL — AB (ref 4.22–5.81)
RDW: 17.6 % — ABNORMAL HIGH (ref 11.5–15.5)
RDW: 17.3 % — AB (ref 11.5–15.5)
WBC: 31.7 10*3/uL — ABNORMAL HIGH (ref 4.0–10.5)
WBC: 27.1 10*3/uL — ABNORMAL HIGH (ref 4.0–10.5)

## 2014-11-19 LAB — PROTIME-INR
INR: 6.46 — AB (ref 0.00–1.49)
INR: 6.53 (ref 0.00–1.49)
PROTHROMBIN TIME: 54.5 s — AB (ref 11.6–15.2)
PROTHROMBIN TIME: 54.9 s — AB (ref 11.6–15.2)

## 2014-11-19 LAB — URINALYSIS, ROUTINE W REFLEX MICROSCOPIC
Glucose, UA: 250 mg/dL — AB
Ketones, ur: NEGATIVE mg/dL
Leukocytes, UA: NEGATIVE
Nitrite: NEGATIVE
Protein, ur: 100 mg/dL — AB
Specific Gravity, Urine: 1.015 (ref 1.005–1.030)
Urobilinogen, UA: 0.2 mg/dL (ref 0.0–1.0)
pH: 5 (ref 5.0–8.0)

## 2014-11-19 LAB — GLUCOSE, CAPILLARY
GLUCOSE-CAPILLARY: 225 mg/dL — AB (ref 65–99)
GLUCOSE-CAPILLARY: 278 mg/dL — AB (ref 65–99)
GLUCOSE-CAPILLARY: 285 mg/dL — AB (ref 65–99)
GLUCOSE-CAPILLARY: 311 mg/dL — AB (ref 65–99)
Glucose-Capillary: 261 mg/dL — ABNORMAL HIGH (ref 65–99)
Glucose-Capillary: 178 mg/dL — ABNORMAL HIGH (ref 65–99)

## 2014-11-19 LAB — RENAL FUNCTION PANEL
ALBUMIN: 3.1 g/dL — AB (ref 3.5–5.0)
ANION GAP: 14 (ref 5–15)
BUN: 94 mg/dL — AB (ref 6–20)
CO2: 21 mmol/L — AB (ref 22–32)
Calcium: 8.2 mg/dL — ABNORMAL LOW (ref 8.9–10.3)
Chloride: 92 mmol/L — ABNORMAL LOW (ref 101–111)
Creatinine, Ser: 6.88 mg/dL — ABNORMAL HIGH (ref 0.61–1.24)
GFR calc Af Amer: 9 mL/min — ABNORMAL LOW (ref >60)
GFR calc non Af Amer: 8 mL/min — ABNORMAL LOW (ref >60)
GLUCOSE: 263 mg/dL — AB (ref 65–99)
PHOSPHORUS: 8.8 mg/dL — AB (ref 2.5–4.6)
Potassium: 5.9 mmol/L — ABNORMAL HIGH (ref 3.5–5.1)
Sodium: 127 mmol/L — ABNORMAL LOW (ref 135–145)

## 2014-11-19 LAB — HEPATITIS PANEL, ACUTE
HCV Ab: <0.1 s/co ratio (ref 0.0–0.9)
Hep A IgM: NEGATIVE
Hep B C IgM: NEGATIVE
Hepatitis B Surface Ag: NEGATIVE

## 2014-11-19 LAB — RAPID URINE DRUG SCREEN, HOSP PERFORMED
Amphetamines: NOT DETECTED
Barbiturates: NOT DETECTED
Benzodiazepines: POSITIVE — AB
Cocaine: NOT DETECTED
Opiates: NOT DETECTED
Tetrahydrocannabinol: NOT DETECTED

## 2014-11-19 LAB — CLOSTRIDIUM DIFFICILE BY PCR: Toxigenic C. Difficile by PCR: NEGATIVE

## 2014-11-19 MED ORDER — PENTAFLUOROPROP-TETRAFLUOROETH EX AERO: 1.0000 "application " | INHALATION_SPRAY | CUTANEOUS | Status: DC | PRN

## 2014-11-19 MED ORDER — SODIUM CHLORIDE 0.9 % IV SOLN: 100.0000 mL | INTRAVENOUS | Status: DC | PRN

## 2014-11-19 MED ORDER — VANCOMYCIN 50 MG/ML ORAL SOLUTION
500.0000 mg | Freq: Four times a day (QID) | ORAL | Status: AC
  Administered 2014-11-19 – 2014-11-25 (×22): 500 mg via ORAL
  Filled 2014-11-19 (×26): qty 10

## 2014-11-19 MED ORDER — HEPARIN SODIUM (PORCINE) 1000 UNIT/ML DIALYSIS
20.0000 [IU]/kg | INTRAMUSCULAR | Status: DC | PRN
  Filled 2014-11-19: qty 2

## 2014-11-19 MED ORDER — ALTEPLASE 2 MG IJ SOLR
2.0000 mg | Freq: Once | INTRAMUSCULAR | Status: AC | PRN
  Filled 2014-11-19: qty 2

## 2014-11-19 MED ORDER — LIDOCAINE HCL (PF) 1 % IJ SOLN
5.0000 mL | INTRAMUSCULAR | Status: DC | PRN
  Filled 2014-11-19: qty 5

## 2014-11-19 MED ORDER — LIDOCAINE-PRILOCAINE 2.5-2.5 % EX CREA
1.0000 "application " | TOPICAL_CREAM | CUTANEOUS | Status: DC | PRN
  Filled 2014-11-19: qty 5

## 2014-11-19 MED ORDER — HEPARIN SODIUM (PORCINE) 1000 UNIT/ML DIALYSIS
1000.0000 [IU] | INTRAMUSCULAR | Status: DC | PRN
  Filled 2014-11-19: qty 1

## 2014-11-19 MED ORDER — CAMPHOR-MENTHOL 0.5-0.5 % EX LOTN
TOPICAL_LOTION | CUTANEOUS | Status: DC | PRN
  Administered 2014-11-19: 1 via TOPICAL
  Administered 2014-11-20: 07:00:00 via TOPICAL
  Administered 2014-11-21 – 2014-11-29 (×2): 1 via TOPICAL
  Administered 2014-11-30: 09:00:00 via TOPICAL
  Filled 2014-11-19 (×3): qty 222

## 2014-11-19 MED ORDER — NEPRO/CARBSTEADY PO LIQD
237.0000 mL | ORAL | Status: DC | PRN
  Filled 2014-11-19: qty 237

## 2014-11-19 MED ORDER — DIPHENHYDRAMINE HCL 50 MG/ML IJ SOLN: 25.0000 mg | Freq: Three times a day (TID) | INTRAMUSCULAR | Status: DC | PRN

## 2014-11-19 NOTE — Care Management Note (Signed)
Case Management Note  Patient Details  Name: Joseph [name] MRN: [id number] Date of Birth: Jul 05, 1955  Subjective/Objective:                 Pt from home admitted with AMS/ Metabolic encephalopathy, due to underlying ESRD, sepsis from C. difficile colitis, and mild DKA. Pt with recent admit 7/18-7/22 for C.diff./ diarrhea.   Action/Plan: Return to home when medically stable. CM to f/u with d/c disposition.  Expected Discharge Date:                  Expected Discharge Plan:  Home/Self Care  In-House Referral:     Discharge planning Services  CM Consult  Post Acute Care Choice:    Choice offered to:     DME Arranged:    DME Agency:     HH Arranged:    HH Agency:     Status of Service:  In process, will continue to follow  Medicare Important Message Given:    Date Medicare IM Given:    Medicare IM give by:    Date Additional Medicare IM Given:    Additional Medicare Important Message give by:     If discussed at [place] of Stay Meetings, dates discussed:    Additional Comments: Beckett Maden (Spouse) [phone]  Sharin Mons, RN 11/19/2014, 1:21 PM

## 2014-11-19 NOTE — Telephone Encounter (Signed)
Message didn't route properly. Re-routing

## 2014-11-19 NOTE — Procedures (Signed)
Patient was seen on dialysis and the procedure was supervised. BFR 400 Via L AVG BP is 118/77.  Patient appears to be tolerating treatment well

## 2014-11-19 NOTE — Telephone Encounter (Signed)
Pt seen [place] 11/18/14 with admission to [place].

## 2014-11-19 NOTE — Progress Notes (Signed)
PT Cancellation Note  Patient Details Name: Joseph [name] MRN: [id number] DOB: 09-19-1955   Cancelled Treatment:    Reason Eval/Treat Not Completed: Medical issues which prohibited therapy. Noted INR >6.5 (increasing) and pt remains AMS. Will defer mobility evaluation until INR decreases to safe level considering pt's incr fall risk with confusion. Will follow.   Shalane Florendo 11/19/2014, 3:19 PM Pager [phone]

## 2014-11-19 NOTE — Telephone Encounter (Signed)
PLEASE NOTE: All timestamps contained within this report are represented as [place] Standard Time. CONFIDENTIALTY NOTICE: This fax transmission is intended only for the addressee. It contains information that is legally privileged, confidential or otherwise protected from use or disclosure. If you are not the intended recipient, you are strictly prohibited from reviewing, disclosing, copying using or disseminating any of this information or taking any action in reliance on or regarding this information. If you have received this fax in error, please notify us immediately by telephone so that we can arrange for its return to [place]. Phone: [phone], Toll-Free: [phone], Fax: [phone] Page: 1 of 2 Call Id: [id number] [place] Patient Name: Joseph [name] Gender: Male DOB: [birth year] Age: 59 Y 12 M 26 D Return Phone Number: [phone] (Primary), [phone] (Secondary) Address: [address] City/State/Zip: [place] [address] Client [facility] Night - Client Client Site [facility] Physician Webb Silversmith Contact Type Call Call Type Triage / Clinical Caller Name Charmayne Sheer Relationship To Patient Spouse Return Phone Number [phone] (Primary) Chief Complaint CONFUSION - new onset Initial Comment Caller states husband treated for CDIFF on Tues, c/o severe nausea, is "talking out of his head", losing memory. Taking Vancomycin HCL for CDIFF PreDisposition InappropriateToAsk Nurse Assessment Nurse: Harlow Mares, RN, Suanne Marker Date/Time Eilene Ghazi Time): 11/17/2014 7:34:44 PM Confirm and document reason for call. If symptomatic, describe symptoms. ---Caller reports patient was in the hospital and had a follow up visit with Rollene Fare, MD on Thursday. Pt is having severe nausea and is not eating, is confused. C-diff was dx with runny stools, this  has improved some. He is also taking probiotics. Reports that patient is drinking a lot of fluids, but is a dialysis patient and is retaining fluid in his legs/feet. Last dialysis was yesterday. Denies fever at this time. Has the patient traveled out of the country within the last 30 days? ---No Does the patient require triage? ---Yes Related visit to physician within the last 2 weeks? ---Yes Does the PT have any chronic conditions? (i.e. diabetes, asthma, etc.) ---Yes List chronic conditions. ---Kidney failure, heart issue Guidelines Guideline Title Affirmed Question Affirmed Notes Nurse Date/Time (Eastern Time) Confusion - Delirium Patient sounds very sick or weak to the triager Harlow Mares, RN, Suanne Marker 11/17/2014 7:39:21 PM Disp. Time Eilene Ghazi Time) Disposition Final User 11/17/2014 7:32:38 PM Send To Call Back Waiting For Nurse Candy Sledge 11/17/2014 7:32:40 PM Send to Urgent Queue [place], [place] 11/17/2014 7:40:44 PM Go to ED Now (or PCP triage) Yes Harlow Mares, RN, Rhonda PLEASE NOTE: All timestamps contained within this report are represented as [place] Standard Time. CONFIDENTIALTY NOTICE: This fax transmission is intended only for the addressee. It contains information that is legally privileged, confidential or otherwise protected from use or disclosure. If you are not the intended recipient, you are strictly prohibited from reviewing, disclosing, copying using or disseminating any of this information or taking any action in reliance on or regarding this information. If you have received this fax in error, please notify us immediately by telephone so that we can arrange for its return to [place]. Phone: [phone], Toll-Free: [phone], Fax: [phone] Page: 2 of 2 Call Id: [id number] Caller Understands: Yes Disagree/Comply: Comply Care Advice Given Per Guideline GO TO ED NOW (OR PCP TRIAGE): * IF NO PCP TRIAGE: You need to be seen. Go to the [facility] at _____________  Hospital within the next hour. Leave as  soon as you can. DRIVING: Another adult should drive. CARE ADVICE given per Confusion- Delirium (Adult) guideline. After Care Instructions Given Call Event Type User Date / Time Description Referrals [facility] - ED

## 2014-11-19 NOTE — Progress Notes (Signed)
ANTICOAGULATION CONSULT NOTE - Follow Up Consult  Pharmacy Consult for Coumadin Indication: atrial fibrillation  Allergies  Allergen Reactions  . Dilaudid [Hydromorphone] Nausea And Vomiting    Patient Measurements: Height: 6' (182.9 cm) Weight: 187 lb 6.3 oz (85 kg) IBW/kg (Calculated) : 77.6  Vital Signs: Temp: 97.7 F (36.5 C) (08/01 0355) Temp Source: Oral (08/01 0355) BP: 131/81 mmHg (08/01 0355)  Labs:  Recent Labs  11/18/14 0422  11/18/14 1620 11/18/14 1958 11/19/14 0318 11/19/14 0550  HGB 12.4*  --   --   --  13.2 12.7*  HCT 38.1*  --   --   --  39.8 38.1*  PLT 209  --   --   --  253 229  LABPROT 35.4*  --   --   --  54.5* 54.9*  INR 3.64*  --   --   --  6.46* 6.53*  CREATININE 5.31*  < > 6.03* 5.58*  --  6.88*  < > = values in this interval not displayed.  Estimated Creatinine Clearance: 12.7 mL/min (by C-G formula based on Cr of 6.88).   Assessment: 20 yom presented to the ED with AMS and DKA. He is chronically anticoagulated on warfarin for history of afib. Currently in Afib. INR was supratherapeutic at 3.64 on admit and now up to 6.53 today (Verified by second check). H/H slightly low but stable and platelets are WNL. No bleeding noted.   Goal of Therapy:  INR 2-3 Monitor platelets by anticoagulation protocol: Yes   Plan:  HOLD coumadin today Monitor daily INR, CBC, s/s of bleed   Milarose Savich J 11/19/2014,8:23 AM

## 2014-11-19 NOTE — Progress Notes (Addendum)
Patient Demographics:    Joseph [name], is a 59 y.o. male, DOB - [birth year], EHO:[id number]  Admit date - 11/18/2014   Admitting Physician Theodis Blaze, MD  Outpatient Primary MD for the patient is Webb Silversmith, NP  LOS - 1   Chief Complaint  Patient presents with  . Altered Mental Status        Subjective:    [name] today has, No headache, No chest pain, No abdominal pain - No Nausea, No new weakness tingling or numbness, No Cough - SOB.     Assessment  & Plan :    1. Metabolic encephalopathy. Due to underlying ESRD, sepsis from C. difficile colitis, mild DKA. Continue supportive care, minimize narcotics and benzodiazepines. Feeding assistance, fall and aspiration precautions.   2. Reoccurrence of C. difficile colitis. Severe. Placed on vancomycin 500mg  - 4 times a day + Probiotic . Monitor.   3. Chronic atrial fibrillation [place] VASC2  Score of 2 - on Cardizem and Coumadin, INR being monitored by Pharmacy.    4.ESRD - M,W,F Dialysis, renal called.   5.Gout - on Colchicine   6. Chr. rash. Discussed his case with his dermatologist Dr. Allyson Sabal who describes this as nonspecific dermatitis. Supportive care only.    7. Dyslipidemia. Continue statin    8. Chronically elevated LFTs. Have been elevated for months, asymptomatic, rolled right upper quadrant pain. Outpatient GI follow-up. On statin. If trend continues to rise may hold statin and monitor. Recent ultrasound noted which is just possible mild cirrhosis. We'll request outpatient GI follow-up.   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 antiseptic oral rinse  7 mL Mouth Rinse 6 times per day  . clobetasol cream  1 application Topical BID  . colchicine  0.6 mg Oral BID  . diltiazem  30 mg Oral 4 times per day  . feeding supplement (NEPRO CARB STEADY)  237 mL Oral BID BM  . insulin aspart  0-9 Units Subcutaneous TID WC  . insulin glargine  40 Units Subcutaneous Daily  . metoprolol  25 mg Oral BID  . multivitamin  1 tablet Oral QHS  . saccharomyces boulardii  250 mg Oral BID  . vancomycin  500 mg Oral 4 times per day  . Warfarin - Pharmacist Dosing Inpatient   Does not apply q1800   Continuous Infusions: . sodium chloride 10 mL/hr at 11/18/14 2300  . insulin (NOVOLIN-R) infusion Stopped (11/18/14 1600)   PRN Meds:.sodium chloride, sodium chloride, alteplase, feeding supplement (NEPRO CARB STEADY), heparin, heparin, lidocaine (PF), lidocaine-prilocaine, pentafluoroprop-tetrafluoroeth  Antibiotics  :    Anti-infectives    Start     Dose/Rate Route Frequency Ordered Stop   11/19/14 1400  vancomycin (VANCOCIN)  50 mg/mL oral solution 500 mg     500 mg Oral 4 times per day 11/19/14 1001 11/25/14 1359   11/18/14 1400  vancomycin (VANCOCIN) 125 MG capsule 250 mg  Status:  Discontinued     250 mg Oral 4 times daily 11/18/14 1128 11/18/14 1248   11/18/14 1400  vancomycin (VANCOCIN) 50 mg/mL oral solution 125 mg  Status:  Discontinued     125 mg Oral 4 times per day 11/18/14 1248 11/18/14 1320   11/18/14 1400  vancomycin (VANCOCIN) 50 mg/mL oral solution 250 mg  Status:  Discontinued     250 mg Oral 4 times per day 11/18/14 1320 11/19/14 1001        Objective:   Filed Vitals:   11/19/14 0821 11/19/14 0851 11/19/14 0900 11/19/14 0930  BP: 132/78 121/80 118/77 119/83  Pulse: 84 98 97 93  Temp:  99.3 F (37.4 C)    TempSrc:  Oral     Resp: 21 15 21 24   Height:      Weight:  85 kg (187 lb 6.3 oz)    SpO2: 100% 98%      Wt Readings from Last 3 Encounters:  11/19/14 85 kg (187 lb 6.3 oz)  11/09/14 79.5 kg (175 lb 4.3 oz)  10/28/14 89.812 kg (198 lb)     Intake/Output Summary (Last 24 hours) at 11/19/14 1002 Last data filed at 11/19/14 0700  Gross per 24 hour  Intake    325 ml  Output    350 ml  Net    -25 ml     Physical Exam  Awake , mildly confused, Oriented X 1, No new F.N deficits, Normal affect [place].AT,PERRAL Supple Neck,No JVD, No cervical lymphadenopathy appriciated.  Symmetrical Chest wall movement, Good air movement bilaterally, CTAB RRR,No Gallops,Rubs or new Murmurs, No Parasternal Heave +ve B.Sounds, Abd Soft, No tenderness, No organomegaly appriciated, No rebound - guarding or rigidity. No Cyanosis, Clubbing or edema, papular dry rash all over mostly on the back    Data Review:   Micro Results Recent Results (from the past 240 hour(s))  MRSA PCR Screening     Status: None   Collection Time: 11/18/14 11:30 AM  Result Value Ref Range Status   MRSA by PCR NEGATIVE NEGATIVE Final    Comment:        The GeneXpert MRSA Assay (FDA approved for NASAL specimens only), is one component of a comprehensive MRSA colonization surveillance program. It is not intended to diagnose MRSA infection nor to guide or monitor treatment for MRSA infections.     Radiology Reports    Ct Head Wo Contrast  11/18/2014   CLINICAL DATA:  Altered mental status.  EXAM: CT HEAD WITHOUT CONTRAST  TECHNIQUE: Contiguous axial images were obtained from the base of the skull through the vertex without intravenous contrast.  COMPARISON:  None.  FINDINGS: No intracranial hemorrhage, mass effect, or midline shift. Small focal encephalomalacia in the left occipital lobe. No hydrocephalus. The basilar cisterns are patent. No evidence of territorial infarct. No intracranial fluid collection. Calvarium is intact. Included  paranasal sinuses and mastoid air cells are well aerated.  IMPRESSION: 1.  No acute intracranial abnormality. 2. Small focal encephalomalacia in the left occipital lobe.   Electronically Signed   By: Jeb Levering M.D.   On: 11/18/2014 06:13        CBC  Recent Labs Lab 11/15/14 1622 11/18/14 0422 11/19/14 0318 11/19/14 0550  WBC 13.5* 17.9* 31.7* 27.1*  HGB  11.9* 12.4* 13.2 12.7*  HCT 37.0* 38.1* 39.8 38.1*  PLT 156.0 209 253 229  MCV 98.4 95.7 95.9 93.6  MCH  --  31.2 31.8 31.2  MCHC 32.2 32.5 33.2 33.3  RDW 18.8* 17.8* 17.6* 17.3*    Chemistries   Recent Labs Lab 11/15/14 1622 11/18/14 0422 11/18/14 1007 11/18/14 1620 11/18/14 1958 11/19/14 0550  NA 133* 122* 130* 130* 132* 127*  K 4.4 5.8* 3.7 4.9 4.8 5.9*  CL 94* 84* 101 92* 96* 92*  CO2 27 21* 19* 22 23 21*  GLUCOSE 703* 1006* 678* 276* 223* 263*  BUN 60* 77* 65* 83* 83* 94*  CREATININE 4.61* 5.31* 4.29* 6.03* 5.58* 6.88*  CALCIUM 8.7 8.3* 6.2* 8.5* 7.7* 8.2*  AST 172* 135*  --   --   --   --   ALT 227* 232*  --   --   --   --   ALKPHOS 217* 206*  --   --   --   --   BILITOT 1.3* 1.5*  --   --   --   --    ------------------------------------------------------------------------------------------------------------------ estimated creatinine clearance is 12.7 mL/min (by C-G formula based on Cr of 6.88). ------------------------------------------------------------------------------------------------------------------ No results for input(s): HGBA1C in the last 72 hours. ------------------------------------------------------------------------------------------------------------------ No results for input(s): CHOL, HDL, LDLCALC, TRIG, CHOLHDL, LDLDIRECT in the last 72 hours. ------------------------------------------------------------------------------------------------------------------ No results for input(s): TSH, T4TOTAL, T3FREE, THYROIDAB in the last 72 hours.  Invalid input(s):  FREET3 ------------------------------------------------------------------------------------------------------------------ No results for input(s): VITAMINB12, FOLATE, FERRITIN, TIBC, IRON, RETICCTPCT in the last 72 hours.  Coagulation profile  Recent Labs Lab 11/15/14 1341 11/18/14 0422 11/19/14 0318 11/19/14 0550  INR 2.4 3.64* 6.46* 6.53*    No results for input(s): DDIMER in the last 72 hours.  Cardiac Enzymes No results for input(s): CKMB, TROPONINI, MYOGLOBIN in the last 168 hours.  Invalid input(s): CK ------------------------------------------------------------------------------------------------------------------ Invalid input(s): POCBNP   Time Spent in minutes   35   SINGH,PRASHANT K M.D on 11/19/2014 at 10:02 AM  Between 7am to 7pm - Pager - [phone]  After 7pm go to [email or web address] - password [facility]  [facility] -  Office  [phone]

## 2014-11-20 ENCOUNTER — Ambulatory Visit: Payer: 59 | Admitting: Physician Assistant

## 2014-11-20 LAB — CBC
HCT: 37.9 % — ABNORMAL LOW (ref 39.0–52.0)
Hemoglobin: 12.7 g/dL — ABNORMAL LOW (ref 13.0–17.0)
MCH: 31.7 pg (ref 26.0–34.0)
MCHC: 33.5 g/dL (ref 30.0–36.0)
MCV: 94.5 fL (ref 78.0–100.0)
Platelets: 193 10*3/uL (ref 150–400)
RBC: 4.01 MIL/uL — AB (ref 4.22–5.81)
RDW: 17.7 % — ABNORMAL HIGH (ref 11.5–15.5)
WBC: 16.3 10*3/uL — ABNORMAL HIGH (ref 4.0–10.5)

## 2014-11-20 LAB — AMMONIA: Ammonia: 44 umol/L — ABNORMAL HIGH (ref 9–35)

## 2014-11-20 LAB — COMPREHENSIVE METABOLIC PANEL
ALK PHOS: 180 U/L — AB (ref 38–126)
ALT: 230 U/L — ABNORMAL HIGH (ref 17–63)
ANION GAP: 14 (ref 5–15)
AST: 161 U/L — ABNORMAL HIGH (ref 15–41)
Albumin: 3.3 g/dL — ABNORMAL LOW (ref 3.5–5.0)
BILIRUBIN TOTAL: 1.5 mg/dL — AB (ref 0.3–1.2)
BUN: 72 mg/dL — ABNORMAL HIGH (ref 6–20)
CO2: 21 mmol/L — ABNORMAL LOW (ref 22–32)
Calcium: 8 mg/dL — ABNORMAL LOW (ref 8.9–10.3)
Chloride: 97 mmol/L — ABNORMAL LOW (ref 101–111)
Creatinine, Ser: 5.62 mg/dL — ABNORMAL HIGH (ref 0.61–1.24)
GFR calc Af Amer: 12 mL/min — ABNORMAL LOW (ref >60)
GFR calc non Af Amer: 10 mL/min — ABNORMAL LOW (ref >60)
Glucose, Bld: 307 mg/dL — ABNORMAL HIGH (ref 65–99)
POTASSIUM: 4.7 mmol/L (ref 3.5–5.1)
Sodium: 132 mmol/L — ABNORMAL LOW (ref 135–145)
Total Protein: 6 g/dL — ABNORMAL LOW (ref 6.5–8.1)

## 2014-11-20 LAB — FERRITIN: Ferritin: 2764 ng/mL — ABNORMAL HIGH (ref 24–336)

## 2014-11-20 LAB — GLUCOSE, CAPILLARY
GLUCOSE-CAPILLARY: 275 mg/dL — AB (ref 65–99)
Glucose-Capillary: 284 mg/dL — ABNORMAL HIGH (ref 65–99)
Glucose-Capillary: 438 mg/dL — ABNORMAL HIGH (ref 65–99)
Glucose-Capillary: 429 mg/dL — ABNORMAL HIGH (ref 65–99)

## 2014-11-20 LAB — HEPATIC FUNCTION PANEL
ALT: 232 U/L — ABNORMAL HIGH (ref 17–63)
AST: 178 U/L — AB (ref 15–41)
Albumin: 3 g/dL — ABNORMAL LOW (ref 3.5–5.0)
Alkaline Phosphatase: 181 U/L — ABNORMAL HIGH (ref 38–126)
Bilirubin, Direct: 0.5 mg/dL (ref 0.1–0.5)
Indirect Bilirubin: 0.8 mg/dL (ref 0.3–0.9)
Total Bilirubin: 1.3 mg/dL — ABNORMAL HIGH (ref 0.3–1.2)
Total Protein: 5.8 g/dL — ABNORMAL LOW (ref 6.5–8.1)

## 2014-11-20 LAB — PROTIME-INR
INR: 3.06 — AB (ref 0.00–1.49)
Prothrombin Time: 31.1 seconds — ABNORMAL HIGH (ref 11.6–15.2)

## 2014-11-20 MED ORDER — COLCHICINE 0.6 MG PO TABS
0.3000 mg | ORAL_TABLET | ORAL | Status: DC
  Administered 2014-11-20 – 2014-11-29 (×4): 0.3 mg via ORAL
  Filled 2014-11-20 (×3): qty 1

## 2014-11-20 MED ORDER — INSULIN GLARGINE 100 UNIT/ML ~~LOC~~ SOLN
50.0000 [IU] | Freq: Every day | SUBCUTANEOUS | Status: DC
  Administered 2014-11-20: 50 [IU] via SUBCUTANEOUS
  Filled 2014-11-20: qty 0.5

## 2014-11-20 MED ORDER — DIPHENHYDRAMINE HCL 50 MG/ML IJ SOLN
12.5000 mg | Freq: Three times a day (TID) | INTRAMUSCULAR | Status: DC | PRN
  Administered 2014-11-23 – 2014-11-25 (×2): 12.5 mg via INTRAVENOUS
  Filled 2014-11-20 (×2): qty 1

## 2014-11-20 MED ORDER — FOSFOMYCIN TROMETHAMINE 3 G PO PACK
3.0000 g | PACK | Freq: Once | ORAL | Status: AC
  Administered 2014-11-20: 3 g via ORAL
  Filled 2014-11-20: qty 3

## 2014-11-20 MED ORDER — INSULIN GLARGINE 100 UNIT/ML ~~LOC~~ SOLN
35.0000 [IU] | Freq: Two times a day (BID) | SUBCUTANEOUS | Status: DC
  Administered 2014-11-20: 35 [IU] via SUBCUTANEOUS
  Filled 2014-11-20 (×3): qty 0.35

## 2014-11-20 MED ORDER — INSULIN ASPART 100 UNIT/ML ~~LOC~~ SOLN
20.0000 [IU] | Freq: Once | SUBCUTANEOUS | Status: AC
  Administered 2014-11-20: 20 [IU] via SUBCUTANEOUS

## 2014-11-20 MED ORDER — INSULIN ASPART 100 UNIT/ML ~~LOC~~ SOLN
10.0000 [IU] | Freq: Once | SUBCUTANEOUS | Status: AC
  Administered 2014-11-20: 10 [IU] via SUBCUTANEOUS
  Filled 2014-11-20: qty 1

## 2014-11-20 NOTE — Progress Notes (Signed)
Inpatient Diabetes Program Recommendations  AACE/ADA: New Consensus Statement on Inpatient Glycemic Control (2013)  Target Ranges:  Prepandial:   less than 140 mg/dL      Peak postprandial:   less than 180 mg/dL (1-2 hours)      Critically ill patients:  140 - 180 mg/dL   Inpatient Diabetes Program Recommendations Insulin - Basal: Please consider increase in lantus to 50 units-fastings are still high Correction (SSI): xxxxxxxxxxx  Thank you Rosita Kea, RN, MSN, CDE  Diabetes Inpatient Program Office: [phone] Pager: [phone] 8:00 am to 5:00 pm

## 2014-11-20 NOTE — Progress Notes (Signed)
ANTICOAGULATION CONSULT NOTE - Follow Up Consult  Pharmacy Consult for Coumadin Indication: atrial fibrillation  Allergies  Allergen Reactions  . Dilaudid [Hydromorphone] Nausea And Vomiting    Patient Measurements: Height: 6' (182.9 cm) Weight: 181 lb 3.5 oz (82.2 kg) IBW/kg (Calculated) : 77.6  Vital Signs: Temp: 98.4 F (36.9 C) (08/02 0427) Temp Source: Oral (08/02 0427) BP: 132/77 mmHg (08/02 0427) Pulse Rate: 99 (08/02 0427)  Labs:  Recent Labs  11/18/14 1620 11/18/14 1958 11/19/14 0318 11/19/14 0550 11/20/14 0605  HGB  --   --  13.2 12.7* 12.7*  HCT  --   --  39.8 38.1* 37.9*  PLT  --   --  253 229 193  LABPROT  --   --  54.5* 54.9* 31.1*  INR  --   --  6.46* 6.53* 3.06*  CREATININE 6.03* 5.58*  --  6.88*  --     Estimated Creatinine Clearance: 12.7 mL/min (by C-G formula based on Cr of 6.88).   Assessment: 66 yom presented to the ED with AMS and DKA. He is chronically anticoagulated on warfarin for history of afib. Currently in Afib.  INR was supratherapeutic at 3.64 on admit and peaked at 6.53 yesterday which was verified with second check. No vitamin K given. INR now down to 3.06. hgb 12.7- stable, plts 193-slight drop, no bleeding noted  Goal of Therapy:  INR 2-3 Monitor platelets by anticoagulation protocol: Yes   Plan:  -HOLD coumadin today. Anticipate it can be restarted tomorrow -Monitor daily INR, CBC -monitor for s/s of bleeding  Wilberth Damon D. Jaccob Czaplicki, PharmD, BCPS Clinical Pharmacist Pager: [phone] 11/20/2014 10:33 AM

## 2014-11-20 NOTE — Progress Notes (Signed)
Patient Demographics:    Joseph [name], is a 58 y.o. male, DOB - Mar 10, 1956, XUX:[id number]  Admit date - 11/18/2014   Admitting Physician Theodis Blaze, MD  Outpatient Primary MD for the patient is Webb Silversmith, NP  LOS - 2   Chief Complaint  Patient presents with  . Altered Mental Status        Subjective:    Joseph [name] today has, No headache, No chest pain, No abdominal pain - No Nausea, No new weakness tingling or numbness, No Cough - SOB.     Assessment  & Plan :    1. Metabolic encephalopathy. Due to underlying ESRD, sepsis from C. difficile colitis, mild DKA with ? UTI . Continue supportive care, minimize narcotics and benzodiazepines. Feeding assistance, fall and aspiration precautions. Better mentation today will reduce Benadryl and check ammonia levels (likely Ok since has diarrhea from C diff) .   2. Reoccurrence of C. difficile colitis. Severe. Placed on vancomycin 500mg  - 4 times a day + Probiotic . Improving.   3. Chronic atrial fibrillation [place] VASC2  Score of 2 - on Cardizem and Coumadin, INR being monitored by Pharmacy.    4.ESRD - M,W,F Dialysis, renal called.   5.Gout - on Colchicine dose adjusted for ESRD.   6. Chr. rash. Discussed his case with his dermatologist Dr. Allyson Sabal who describes this as nonspecific dermatitis. Supportive care only. Reduce Benadryl as he is confused continue the Steroid cream.    7. Dyslipidemia. Continue statin    8. Chronically elevated LFTs. Have been elevated for months, asymptomatic, rolled right upper quadrant pain. Outpatient GI follow-up. On statin hold. Recent ultrasound noted which is just possible mild cirrhosis. Check Hepatitis panel, Ferritin, We'll request outpatient GI follow-up.   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 Enterococcal UTI - with Dilirium will give 1 dose of Fosfomycin.    Code Status : Full  Family Communication  : wife bedside in detail  Disposition Plan  : TBD  Consults  :  Renal  Procedures  :   DVT Prophylaxis  :  Coumadin  Lab Results  Component Value Date   INR 3.06* 11/20/2014   INR 6.53* 11/19/2014   INR 6.46* 11/19/2014     Lab Results  Component Value Date   PLT 193 11/20/2014    Inpatient Medications  Scheduled Meds: . antiseptic oral rinse  7 mL Mouth Rinse 6 times per day  . clobetasol cream  1 application Topical BID  . [START ON 11/22/2014] colchicine  0.3 mg Oral Once per day on Mon Thu  . diltiazem  30 mg Oral 4 times per day  . feeding supplement (NEPRO CARB STEADY)  237 mL Oral BID BM  . fosfomycin  3 g Oral Once  . insulin aspart  0-9 Units Subcutaneous TID WC  . insulin glargine  40 Units Subcutaneous Daily  . metoprolol  25 mg Oral BID  . multivitamin  1 tablet Oral QHS  .  saccharomyces boulardii  250 mg Oral BID  . vancomycin  500 mg Oral 4 times per day  . Warfarin - Pharmacist Dosing Inpatient   Does not apply q1800   Continuous Infusions: . sodium chloride 10 mL/hr at 11/18/14 2300  . insulin (NOVOLIN-R) infusion Stopped (11/18/14 1600)   PRN Meds:.sodium chloride, sodium chloride, camphor-menthol, diphenhydrAMINE, feeding supplement (NEPRO CARB STEADY), heparin, heparin, lidocaine (PF), lidocaine-prilocaine, pentafluoroprop-tetrafluoroeth  Antibiotics  :    Anti-infectives    Start     Dose/Rate Route Frequency Ordered Stop   11/19/14 1400  vancomycin (VANCOCIN) 50 mg/mL oral solution 500 mg     500 mg Oral 4 times per day 11/19/14 1001 11/25/14 1359   11/18/14 1400  vancomycin (VANCOCIN) 125 MG capsule 250 mg  Status:   Discontinued     250 mg Oral 4 times daily 11/18/14 1128 11/18/14 1248   11/18/14 1400  vancomycin (VANCOCIN) 50 mg/mL oral solution 125 mg  Status:  Discontinued     125 mg Oral 4 times per day 11/18/14 1248 11/18/14 1320   11/18/14 1400  vancomycin (VANCOCIN) 50 mg/mL oral solution 250 mg  Status:  Discontinued     250 mg Oral 4 times per day 11/18/14 1320 11/19/14 1001        Objective:   Filed Vitals:   11/19/14 1900 11/19/14 2028 11/20/14 0019 11/20/14 0427  BP:  129/80 137/81 132/77  Pulse:  97 96 99  Temp:  99.5 F (37.5 C) 98.6 F (37 C) 98.4 F (36.9 C)  TempSrc:  Oral Oral Oral  Resp:  18 18 18   Height:      Weight: 82.2 kg (181 lb 3.5 oz)     SpO2:  99% 93% 92%    Wt Readings from Last 3 Encounters:  11/19/14 82.2 kg (181 lb 3.5 oz)  11/09/14 79.5 kg (175 lb 4.3 oz)  10/28/14 89.812 kg (198 lb)     Intake/Output Summary (Last 24 hours) at 11/20/14 0910 Last data filed at 11/20/14 0640  Gross per 24 hour  Intake    340 ml  Output   2300 ml  Net  -1960 ml     Physical Exam  Awake , mildly confused, Oriented X 1, No new F.N deficits, Normal affect [place].AT,PERRAL Supple Neck,No JVD, No cervical lymphadenopathy appriciated.  Symmetrical Chest wall movement, Good air movement bilaterally, CTAB RRR,No Gallops,Rubs or new Murmurs, No Parasternal Heave +ve B.Sounds, Abd Soft, No tenderness, No organomegaly appriciated, No rebound - guarding or rigidity. No Cyanosis, Clubbing or edema, papular dry rash all over mostly on the back    Data Review:   Micro Results Recent Results (from the past 240 hour(s))  Blood culture (routine x 2)     Status: None (Preliminary result)   Collection Time: 11/18/14  6:30 AM  Result Value Ref Range Status   Specimen Description BLOOD LEFT ARM  Final   Special Requests BOTTLES DRAWN AEROBIC AND ANAEROBIC 5CC  Final   Culture NO GROWTH 1 DAY  Final   Report Status PENDING  Incomplete  Blood culture (routine x 2)     Status:  None (Preliminary result)   Collection Time: 11/18/14  6:30 AM  Result Value Ref Range Status   Specimen Description BLOOD LEFT HAND  Final   Special Requests BOTTLES DRAWN AEROBIC AND ANAEROBIC 5CC  Final   Culture NO GROWTH 1 DAY  Final   Report Status PENDING  Incomplete  Urine culture  Status: None (Preliminary result)   Collection Time: 11/18/14 11:30 AM  Result Value Ref Range Status   Specimen Description URINE, RANDOM  Final   Special Requests NONE  Final   Culture   Final    >=100,000 COLONIES/mL ENTEROCOCCUS SPECIES 30,000 COLONIES/mL ESCHERICHIA COLI    Report Status PENDING  Incomplete  MRSA PCR Screening     Status: None   Collection Time: 11/18/14 11:30 AM  Result Value Ref Range Status   MRSA by PCR NEGATIVE NEGATIVE Final    Comment:        The GeneXpert MRSA Assay (FDA approved for NASAL specimens only), is one component of a comprehensive MRSA colonization surveillance program. It is not intended to diagnose MRSA infection nor to guide or monitor treatment for MRSA infections.   Clostridium Difficile by PCR (not at [facility])     Status: None   Collection Time: 11/19/14  3:50 PM  Result Value Ref Range Status   C difficile by pcr NEGATIVE NEGATIVE Final    Radiology Reports    Ct Head Wo Contrast  11/18/2014   CLINICAL DATA:  Altered mental status.  EXAM: CT HEAD WITHOUT CONTRAST  TECHNIQUE: Contiguous axial images were obtained from the base of the skull through the vertex without intravenous contrast.  COMPARISON:  None.  FINDINGS: No intracranial hemorrhage, mass effect, or midline shift. Small focal encephalomalacia in the left occipital lobe. No hydrocephalus. The basilar cisterns are patent. No evidence of territorial infarct. No intracranial fluid collection. Calvarium is intact. Included paranasal sinuses and mastoid air cells are well aerated.  IMPRESSION: 1.  No acute intracranial abnormality. 2. Small focal encephalomalacia in the left occipital  lobe.   Electronically Signed   By: Jeb Levering M.D.   On: 11/18/2014 06:13        CBC  Recent Labs Lab 11/15/14 1622 11/18/14 0422 11/19/14 0318 11/19/14 0550 11/20/14 0605  WBC 13.5* 17.9* 31.7* 27.1* 16.3*  HGB 11.9* 12.4* 13.2 12.7* 12.7*  HCT 37.0* 38.1* 39.8 38.1* 37.9*  PLT 156.0 209 253 229 193  MCV 98.4 95.7 95.9 93.6 94.5  MCH  --  31.2 31.8 31.2 31.7  MCHC 32.2 32.5 33.2 33.3 33.5  RDW 18.8* 17.8* 17.6* 17.3* 17.7*    Chemistries   Recent Labs Lab 11/15/14 1622 11/18/14 0422 11/18/14 1007 11/18/14 1620 11/18/14 1958 11/19/14 0550 11/20/14 0605  NA 133* 122* 130* 130* 132* 127*  --   K 4.4 5.8* 3.7 4.9 4.8 5.9*  --   CL 94* 84* 101 92* 96* 92*  --   CO2 27 21* 19* 22 23 21*  --   GLUCOSE 703* 1006* 678* 276* 223* 263*  --   BUN 60* 77* 65* 83* 83* 94*  --   CREATININE 4.61* 5.31* 4.29* 6.03* 5.58* 6.88*  --   CALCIUM 8.7 8.3* 6.2* 8.5* 7.7* 8.2*  --   AST 172* 135*  --   --   --   --  178*  ALT 227* 232*  --   --   --   --  232*  ALKPHOS 217* 206*  --   --   --   --  181*  BILITOT 1.3* 1.5*  --   --   --   --  1.3*   ------------------------------------------------------------------------------------------------------------------ estimated creatinine clearance is 12.7 mL/min (by C-G formula based on Cr of 6.88). ------------------------------------------------------------------------------------------------------------------ No results for input(s): HGBA1C in the last 72 hours. ------------------------------------------------------------------------------------------------------------------ No results for input(s): CHOL, HDL,  LDLCALC, TRIG, CHOLHDL, LDLDIRECT in the last 72 hours. ------------------------------------------------------------------------------------------------------------------ No results for input(s): TSH, T4TOTAL, T3FREE, THYROIDAB in the last 72 hours.  Invalid input(s):  FREET3 ------------------------------------------------------------------------------------------------------------------ No results for input(s): VITAMINB12, FOLATE, FERRITIN, TIBC, IRON, RETICCTPCT in the last 72 hours.  Coagulation profile  Recent Labs Lab 11/15/14 1341 11/18/14 0422 11/19/14 0318 11/19/14 0550 11/20/14 0605  INR 2.4 3.64* 6.46* 6.53* 3.06*    No results for input(s): DDIMER in the last 72 hours.  Cardiac Enzymes No results for input(s): CKMB, TROPONINI, MYOGLOBIN in the last 168 hours.  Invalid input(s): CK ------------------------------------------------------------------------------------------------------------------ Invalid input(s): POCBNP   Time Spent in minutes   35   SINGH,PRASHANT K M.D on 11/20/2014 at 9:10 AM  Between 7am to 7pm - Pager - [phone]  After 7pm go to [email or web address] - password [facility]  [facility] -  Office  [phone]

## 2014-11-20 NOTE — Progress Notes (Signed)
Patient ID: Joseph [name], male   DOB: 1956/01/06, 59 y.o.   MRN: [id number]  [facility] Progress Note    Subjective:   Feels well but wife reports his memory is still not back to baseline   Objective:   BP 132/77 mmHg  Pulse 99  Temp(Src) 98.4 F (36.9 C) (Oral)  Resp 18  Ht 6' (1.829 m)  Wt 82.2 kg (181 lb 3.5 oz)  BMI 24.57 kg/m2  SpO2 92%  Intake/Output: I/O last 3 completed shifts: In: 390 [P.O.:340; I.V.:50] Out: 2400 [Urine:100; Other:2300]   Intake/Output this shift:    Weight change: 2.6 kg (5 lb 11.7 oz)  Physical Exam: Gen:WD WN AAM in NAD CVS:no rub Resp:cta HAL:PFXTKW Ext:no edema, L Forearm AVG +T/B  Labs: BMET  Recent Labs Lab 11/15/14 1622 11/18/14 0422 11/18/14 1007 11/18/14 1620 11/18/14 1958 11/19/14 0550 11/20/14 0605  NA 133* 122* 130* 130* 132* 127*  --   K 4.4 5.8* 3.7 4.9 4.8 5.9*  --   CL 94* 84* 101 92* 96* 92*  --   CO2 27 21* 19* 22 23 21*  --   GLUCOSE 703* 1006* 678* 276* 223* 263*  --   BUN 60* 77* 65* 83* 83* 94*  --   CREATININE 4.61* 5.31* 4.29* 6.03* 5.58* 6.88*  --   ALBUMIN 3.4* 3.0*  --   --   --  3.1* 3.0*  CALCIUM 8.7 8.3* 6.2* 8.5* 7.7* 8.2*  --   PHOS  --   --   --   --   --  8.8*  --    CBC  Recent Labs Lab 11/18/14 0422 11/19/14 0318 11/19/14 0550 11/20/14 0605  WBC 17.9* 31.7* 27.1* 16.3*  HGB 12.4* 13.2 12.7* 12.7*  HCT 38.1* 39.8 38.1* 37.9*  MCV 95.7 95.9 93.6 94.5  PLT 209 253 229 193    @IMGRELPRIORS @ Medications:    . antiseptic oral rinse  7 mL Mouth Rinse 6 times per day  . clobetasol cream  1 application Topical BID  . [START ON 11/22/2014] colchicine  0.3 mg Oral Once per day on Mon Thu  . diltiazem  30 mg Oral 4 times per day  . feeding supplement (NEPRO CARB STEADY)  237 mL Oral BID BM  . fosfomycin  3 g Oral Once  . insulin aspart  0-9 Units Subcutaneous TID WC  . insulin glargine  40 Units Subcutaneous Daily  . metoprolol  25 mg Oral BID  . multivitamin  1 tablet  Oral QHS  . saccharomyces boulardii  250 mg Oral BID  . vancomycin  500 mg Oral 4 times per day  . Warfarin - Pharmacist Dosing Inpatient   Does not apply q1800   Dialysis Orders: Center: [facility] MWF 180 EDW 80 3 K 2.5 Ca 450/800 profile 2 AVGG heparin 6100 no Fe or ESA calcitriol 1.75 Recent labs; 7/27 Hgb 11.6 WBC ^ at 12.9 89% N ferritin 1122 42% sat Ca/P ok iPTH 427 stable hgb A1C 10.7 up from 6.1 in April and 7.4 in January   Assessment/ Plan:   1. DKA-  Improved but DM has been poorly controlled over the last 6 months. 2. C. Diff colitis- on appropriate abx 3. AMS- unclear etiology as this has remained despite improvement of DKA. 1. Recommend Neurology evaluation as metabolic encephalopathy should have cleared now that his DKA and infection have been treated.  Also pt's wife states that his memory was an issue before he developed DKA/C  Diff.   4. ESRD- continue with HD qMWF 5. Enterococcal UTI- would recommend treatment given leukocytosis and AMS.  Await sensitivities and ask Dr. Linus Salmons for recommendations in light of C. Diff. 6. Anemia of chronic disease- currently ESA on hold due to Hgb >12 7. CKD-MBD: poorly controlled phos, cont with binders 8. Abnormal LFT's- will recheck today 9. Dermatitis- possibly related to hyperphosphatemia.   10. Nutrition:per primary 11. Hypertension: stable 12. A fib- rate controlled, on coumadin  Cardale Dorer A 11/20/2014, 9:10 AM

## 2014-11-20 NOTE — Evaluation (Signed)
Physical Therapy Evaluation Patient Details Name: Joseph [name] MRN: [id number] DOB: [birth year] Today's Date: 11/20/2014   History of Present Illness  Pt adm metabolic encephalopathy. Due to underlying ESRD, sepsis from C. difficile colitis, mild DKA with ? UTI. PMH - ESRD on HD, DM, C-diff  Clinical Impression  Pt admitted with above diagnosis and presents to PT with functional limitations due to deficits listed below (See PT problem list). Pt needs skilled PT to maximize independence and safety to allow discharge to home with wife and HHPT.      Follow Up Recommendations Home health PT;Supervision for mobility/OOB    Equipment Recommendations  None recommended by PT    Recommendations for Other Services       Precautions / Restrictions Precautions Precautions: Fall      Mobility  Bed Mobility               General bed mobility comments: Pt up in chair.  Transfers Overall transfer level: Needs assistance Equipment used: Rolling walker (2 wheeled) Transfers: Sit to/from Stand Sit to Stand: Min assist         General transfer comment: Assist to bring hips up and for balance.  Ambulation/Gait Ambulation/Gait assistance: Min assist Ambulation Distance (Feet): 100 Feet Assistive device: Rolling walker (2 wheeled) Gait Pattern/deviations: Step-through pattern;Decreased step length - right;Decreased step length - left;Trunk flexed;Antalgic Gait velocity: decr Gait velocity interpretation: Below normal speed for age/gender General Gait Details: Verbal cues to stand more erect. Pt c/o calf pain with amb which his wife says he has complained about for some time. Assist for balance. Pt amb on room air and SaO2 94%.  Stairs            Wheelchair Mobility    Modified Rankin (Stroke Patients Only)       Balance Overall balance assessment: Needs assistance Sitting-balance support: No upper extremity supported Sitting balance-Leahy Scale: Good     Standing  balance support: Bilateral upper extremity supported Standing balance-Leahy Scale: Poor Standing balance comment: support of walker and min guard for static standing.                             Pertinent Vitals/Pain Pain Assessment: Faces Faces Pain Scale: Hurts a little bit Pain Location: bil calves Pain Descriptors / Indicators: Sore Pain Intervention(s): Limited activity within patient's tolerance;Repositioned    Home Living Family/patient expects to be discharged to:: Private residence Living Arrangements: Spouse/significant other Available Help at Discharge: Family Type of Home: House Home Access: Level entry     Home Layout: One level Home Equipment: Cane - single point;Walker - 2 wheels      Prior Function Level of Independence: Needs assistance   Gait / Transfers Assistance Needed: Uses cane at times and recently requiring assist at times.           Hand Dominance   Dominant Hand: Right    Extremity/Trunk Assessment   Upper Extremity Assessment: Overall WFL for tasks assessed           Lower Extremity Assessment: Generalized weakness         Communication   Communication: No difficulties  Cognition Arousal/Alertness: Awake/alert Behavior During Therapy: WFL for tasks assessed/performed Overall Cognitive Status: Impaired/Different from baseline Area of Impairment: Attention;Safety/judgement;Problem solving   Current Attention Level: Sustained Memory: Decreased recall of precautions;Decreased short-term memory   Safety/Judgement: Decreased awareness of safety;Decreased awareness of deficits   Problem Solving: Requires verbal  cues      General Comments      Exercises        Assessment/Plan    PT Assessment Patient needs continued PT services  PT Diagnosis Difficulty walking;Generalized weakness   PT Problem List Decreased strength;Decreased activity tolerance;Decreased balance;Decreased mobility;Decreased knowledge of  use of DME  PT Treatment Interventions DME instruction;Gait training;Functional mobility training;Therapeutic activities;Therapeutic exercise;Balance training;Patient/family education   PT Goals (Current goals can be found in the Care Plan section) Acute Rehab PT Goals Patient Stated Goal: Return home PT Goal Formulation: With patient Time For Goal Achievement: 11/27/14 Potential to Achieve Goals: Good    Frequency Min 3X/week   Barriers to discharge        Co-evaluation               End of Session Equipment Utilized During Treatment: Gait belt Activity Tolerance: Patient tolerated treatment well Patient left: in chair;with call bell/phone within reach;with family/visitor present Nurse Communication: Mobility status;Other (comment) (SaO2 and that O2 left off of pt)         Time: 1010-1027 PT Time Calculation (min) (ACUTE ONLY): 17 min   Charges:   PT Evaluation $Initial PT Evaluation Tier I: 1 Procedure     PT G Codes:        Jazlene Bares Dec 02, 2014, 1:47 PM  [place] [phone]

## 2014-11-21 ENCOUNTER — Inpatient Hospital Stay (HOSPITAL_COMMUNITY): Payer: Medicare Other

## 2014-11-21 LAB — CBC
HEMATOCRIT: 34.3 % — AB (ref 39.0–52.0)
HEMOGLOBIN: 11.9 g/dL — AB (ref 13.0–17.0)
MCH: 31.5 pg (ref 26.0–34.0)
MCHC: 34.7 g/dL (ref 30.0–36.0)
MCV: 90.7 fL (ref 78.0–100.0)
Platelets: 178 10*3/uL (ref 150–400)
RBC: 3.78 MIL/uL — AB (ref 4.22–5.81)
RDW: 17.3 % — AB (ref 11.5–15.5)
WBC: 15.6 10*3/uL — AB (ref 4.0–10.5)

## 2014-11-21 LAB — GLUCOSE, CAPILLARY
GLUCOSE-CAPILLARY: 59 mg/dL — AB (ref 65–99)
GLUCOSE-CAPILLARY: 120 mg/dL — AB (ref 65–99)
Glucose-Capillary: 87 mg/dL (ref 65–99)
Glucose-Capillary: 54 mg/dL — ABNORMAL LOW (ref 65–99)
Glucose-Capillary: 73 mg/dL (ref 65–99)
Glucose-Capillary: 152 mg/dL — ABNORMAL HIGH (ref 65–99)

## 2014-11-21 LAB — BASIC METABOLIC PANEL
Anion gap: 12 (ref 5–15)
BUN: 88 mg/dL — ABNORMAL HIGH (ref 6–20)
CALCIUM: 8 mg/dL — AB (ref 8.9–10.3)
CO2: 23 mmol/L (ref 22–32)
CREATININE: 5.89 mg/dL — AB (ref 0.61–1.24)
Chloride: 99 mmol/L — ABNORMAL LOW (ref 101–111)
GFR calc Af Amer: 11 mL/min — ABNORMAL LOW (ref >60)
GFR calc non Af Amer: 9 mL/min — ABNORMAL LOW (ref >60)
Glucose, Bld: 60 mg/dL — ABNORMAL LOW (ref 65–99)
Potassium: 4 mmol/L (ref 3.5–5.1)
Sodium: 134 mmol/L — ABNORMAL LOW (ref 135–145)

## 2014-11-21 LAB — URINE CULTURE

## 2014-11-21 LAB — HEPATITIS PANEL, ACUTE
HCV Ab: <0.1 s/co ratio (ref 0.0–0.9)
Hep A IgM: NEGATIVE
Hep B C IgM: NEGATIVE
Hepatitis B Surface Ag: NEGATIVE

## 2014-11-21 LAB — FOLATE: FOLATE: 24.1 ng/mL (ref >5.9)

## 2014-11-21 LAB — PROTIME-INR
INR: 1.66 — ABNORMAL HIGH (ref 0.00–1.49)
Prothrombin Time: 19.6 seconds — ABNORMAL HIGH (ref 11.6–15.2)

## 2014-11-21 LAB — VITAMIN B12: VITAMIN B 12: 1116 pg/mL — AB (ref 180–914)

## 2014-11-21 LAB — SEDIMENTATION RATE: SED RATE: 13 mm/h (ref 0–16)

## 2014-11-21 LAB — PHOSPHORUS: PHOSPHORUS: 6.1 mg/dL — AB (ref 2.5–4.6)

## 2014-11-21 LAB — TSH: TSH: 1.386 u[IU]/mL (ref 0.350–4.500)

## 2014-11-21 LAB — ALBUMIN: ALBUMIN: 3 g/dL — AB (ref 3.5–5.0)

## 2014-11-21 MED ORDER — SEVELAMER CARBONATE 800 MG PO TABS
1600.0000 mg | ORAL_TABLET | Freq: Three times a day (TID) | ORAL | Status: DC
  Administered 2014-11-21 – 2014-12-02 (×28): 1600 mg via ORAL
  Filled 2014-11-21 (×28): qty 2

## 2014-11-21 MED ORDER — WARFARIN SODIUM 2.5 MG PO TABS
2.5000 mg | ORAL_TABLET | Freq: Once | ORAL | Status: AC
  Administered 2014-11-21: 2.5 mg via ORAL
  Filled 2014-11-21: qty 1

## 2014-11-21 MED ORDER — CALCITRIOL 0.25 MCG PO CAPS
1.7500 ug | ORAL_CAPSULE | ORAL | Status: DC
  Administered 2014-11-21 – 2014-11-30 (×3): 1.75 ug via ORAL
  Filled 2014-11-21 (×4): qty 7

## 2014-11-21 MED ORDER — INSULIN GLARGINE 100 UNIT/ML ~~LOC~~ SOLN
35.0000 [IU] | Freq: Every day | SUBCUTANEOUS | Status: DC
  Filled 2014-11-21: qty 0.35

## 2014-11-21 MED ORDER — HEPARIN SODIUM (PORCINE) 1000 UNIT/ML DIALYSIS
20.0000 [IU]/kg | INTRAMUSCULAR | Status: DC | PRN
  Filled 2014-11-21: qty 2

## 2014-11-21 MED ORDER — SEVELAMER CARBONATE 800 MG PO TABS: 1600.0000 mg | ORAL_TABLET | Freq: Two times a day (BID) | ORAL | Status: DC

## 2014-11-21 NOTE — Progress Notes (Signed)
Patient is currently in dialysis and not available for EEG at this time. EEG will be done 11/22/14. Nurse Romie Minus) made aware.

## 2014-11-21 NOTE — Progress Notes (Signed)
Hemo- CBG recheck =73, pt transported back to room so that he can eat.

## 2014-11-21 NOTE — Progress Notes (Signed)
ANTICOAGULATION CONSULT NOTE - Follow Up Consult  Pharmacy Consult for Coumadin Indication: atrial fibrillation  Allergies  Allergen Reactions  . Dilaudid [Hydromorphone] Nausea And Vomiting    Patient Measurements: Height: 6' (182.9 cm) Weight: 181 lb (82.1 kg) IBW/kg (Calculated) : 77.6  Vital Signs: Temp: 98.6 F (37 C) (08/03 0812) Temp Source: Oral (08/03 0812) BP: 162/89 mmHg (08/03 0948) Pulse Rate: 90 (08/03 0948)  Labs:  Recent Labs  11/19/14 0550 11/20/14 0605 11/20/14 1102 11/21/14 0628 11/21/14 0830  HGB 12.7* 12.7*  --   --  11.9*  HCT 38.1* 37.9*  --   --  34.3*  PLT 229 193  --   --  178  LABPROT 54.9* 31.1*  --  >90.0* 19.6*  INR 6.53* 3.06*  --  >10.00* 1.66*  CREATININE 6.88*  --  5.62*  --  5.89*    Estimated Creatinine Clearance: 14.8 mL/min (by C-G formula based on Cr of 5.89).   Assessment: 35 yom presented to the ED with AMS and DKA. He is chronically anticoagulated on warfarin for history of afib (home dose: 5mg  daily).   INR was supratherapeutic at 3.64 on admit and increased to >6 on 8/1. Yesterday, INR fell to 3.06, however this morning the first reading resulted >10. This was rechecked since patient has not received any warfarin since admission. Recheck INR was 1.66.  No vitamin K has been given during hospitalization.  hgb 11.9- stable, plts 178-slight drop, no bleeding noted. Not on iron or Aranesp  Goal of Therapy:  INR 2-3 Monitor platelets by anticoagulation protocol: Yes   Plan:  -warfarin 2.5mg  po x1 tonight- dosing cautiously as unsure why patient's INR was elevated on admission -Monitor daily INR, CBC -monitor for s/s of bleeding  Joseph [name] D. Ceonna Frazzini, PharmD, BCPS Clinical Pharmacist Pager: [phone] 11/21/2014 10:26 AM

## 2014-11-21 NOTE — Procedures (Signed)
Patient was seen on dialysis and the procedure was supervised. BFR 400 Via LAVF BP is 138/69.  Patient appears to be tolerating treatment well but remains delirious.  Was oriented to person only.  Recommend neurology evaluation as his metabolic derangements have been corrected.

## 2014-11-21 NOTE — Progress Notes (Signed)
PT Cancellation Note  Patient Details Name: Joseph [name] MRN: [id number] DOB: Dec 31, 1955   Cancelled Treatment:    Reason Eval/Treat Not Completed: Patient at procedure or test/unavailable;Medical issues which prohibited therapy (Pt at HD and also with INR >10)   Zaira Iacovelli 11/21/2014, 9:58 AM  Suanne Marker PT [phone]

## 2014-11-21 NOTE — Progress Notes (Signed)
PATIENT DETAILS Name: Joseph [name] Age: 59 y.o. Sex: male Date of Birth: [birth year] Admit Date: 11/18/2014 Admitting Physician Theodis Blaze, MD GBT:DVVOH, REGINA, NP  Subjective: Confused-answers only a few questions appropriately  Assessment/Plan: Principal Problem: Acute encephalopathy:Suspected toxic metabolic encephalopathy from mild DKA, C. difficile colitis. Unfortunately continues to occur in spite of improvement in blood sugars and C. difficile colitis. Check MRI brain, EEG. Neurology consultation. Per wife, does have a history of prior alcohol abuse-but apparently has not had a drink since  October 2015  Active Problems: C. difficile colitis: Per RN diarrhea seems to be slowing down, continue with oral vancomycin.   History of chronic atrial fibrillation: Chads2vasc score of 2, continue Cardizem, Coumadin per pharmacy   DKA: Resolved.Transition to subcutaneous insulin. See below   Type 2 diabetes:  Hypoglycemia this afternoon-currently on Lantus 35 units twice a day-we will change to 35 units daily starting tomorrow. Continue with SSI  End-stage renal disease: On hemodialysis-Monday, Wednesday and Friday. Nephrology following  Gout: Continue with colcichine  Chronic skin rash:Dr Candiss Norse Discussed his case with his dermatologist Dr. Allyson Sabal who describes this as nonspecific dermatitis. Supportive care only.  Enterococcal UTI: with Dilirium given1 dose of Fosfomycin on 8/2  Disposition: Remain inpatient-home when mental status improves  Antimicrobial agents  See below  Anti-infectives    Start     Dose/Rate Route Frequency Ordered Stop   11/19/14 1400  vancomycin (VANCOCIN) 50 mg/mL oral solution 500 mg     500 mg Oral 4 times per day 11/19/14 1001 11/25/14 1359   11/18/14 1400  vancomycin (VANCOCIN) 125 MG capsule 250 mg  Status:  Discontinued     250 mg Oral 4 times daily 11/18/14 1128 11/18/14 1248   11/18/14 1400  vancomycin (VANCOCIN) 50 mg/mL  oral solution 125 mg  Status:  Discontinued     125 mg Oral 4 times per day 11/18/14 1248 11/18/14 1320   11/18/14 1400  vancomycin (VANCOCIN) 50 mg/mL oral solution 250 mg  Status:  Discontinued     250 mg Oral 4 times per day 11/18/14 1320 11/19/14 1001      DVT Prophylaxis: Coumadin  Code Status: Full code   Family Communication Spouse in room  Procedures: None  CONSULTS:  nephrology and neurology  Time spent 30 minutes-Greater than 50% of this time was spent in counseling, explanation of diagnosis, planning of further management, and coordination of care.  MEDICATIONS: Scheduled Meds: . antiseptic oral rinse  7 mL Mouth Rinse 6 times per day  . calcitRIOL  1.75 mcg Oral Q M,W,F-HD  . clobetasol cream  1 application Topical BID  . [START ON 11/22/2014] colchicine  0.3 mg Oral Once per day on Mon Thu  . diltiazem  30 mg Oral 4 times per day  . feeding supplement (NEPRO CARB STEADY)  237 mL Oral BID BM  . insulin aspart  0-9 Units Subcutaneous TID WC  . insulin glargine  35 Units Subcutaneous BID  . metoprolol  25 mg Oral BID  . multivitamin  1 tablet Oral QHS  . saccharomyces boulardii  250 mg Oral BID  . sevelamer carbonate  1,600 mg Oral TID WC  . vancomycin  500 mg Oral 4 times per day  . warfarin  2.5 mg Oral ONCE-1800  . Warfarin - Pharmacist Dosing Inpatient   Does not apply q1800   Continuous Infusions: . sodium chloride 10 mL/hr  at 11/18/14 2300  . insulin (NOVOLIN-R) infusion Stopped (11/18/14 1600)   PRN Meds:.sodium chloride, sodium chloride, camphor-menthol, diphenhydrAMINE, feeding supplement (NEPRO CARB STEADY), heparin, heparin, heparin, lidocaine (PF), lidocaine-prilocaine, pentafluoroprop-tetrafluoroeth    PHYSICAL EXAM: Vital signs in last 24 hours: Filed Vitals:   11/21/14 1101 11/21/14 1130 11/21/14 1200 11/21/14 1229  BP: 140/82 156/81 153/74 145/60  Pulse: 92 87 100 99  Temp:      TempSrc:      Resp:    16  Height:      Weight:     79.6 kg (175 lb 7.8 oz)  SpO2:    98%    Weight change:  Filed Weights   11/19/14 1900 11/21/14 0812 11/21/14 1229  Weight: 82.2 kg (181 lb 3.5 oz) 82.1 kg (181 lb) 79.6 kg (175 lb 7.8 oz)   Body mass index is 23.79 kg/(m^2).   Gen Exam: Awake but very confused   Neck: Supple, No JVD.   Chest: B/L Clear.   CVS: S1 S2 Regular, no murmurs.  Abdomen: soft, BS +, non tender, non distended.  Extremities: no edema, lower extremities warm to touch. Neurologic: Non Focal.  Skin:papular dry rash all over mostly on the back Wounds: N/A.    Intake/Output from previous day:  Intake/Output Summary (Last 24 hours) at 11/21/14 1702 Last data filed at 11/21/14 1229  Gross per 24 hour  Intake    340 ml  Output   1736 ml  Net  -1396 ml     LAB RESULTS: CBC  Recent Labs Lab 11/18/14 0422 11/19/14 0318 11/19/14 0550 11/20/14 0605 11/21/14 0830  WBC 17.9* 31.7* 27.1* 16.3* 15.6*  HGB 12.4* 13.2 12.7* 12.7* 11.9*  HCT 38.1* 39.8 38.1* 37.9* 34.3*  PLT 209 253 229 193 178  MCV 95.7 95.9 93.6 94.5 90.7  MCH 31.2 31.8 31.2 31.7 31.5  MCHC 32.5 33.2 33.3 33.5 34.7  RDW 17.8* 17.6* 17.3* 17.7* 17.3*    Chemistries   Recent Labs Lab 11/18/14 1620 11/18/14 1958 11/19/14 0550 11/20/14 1102 11/21/14 0830  NA 130* 132* 127* 132* 134*  K 4.9 4.8 5.9* 4.7 4.0  CL 92* 96* 92* 97* 99*  CO2 22 23 21* 21* 23  GLUCOSE 276* 223* 263* 307* 60*  BUN 83* 83* 94* 72* 88*  CREATININE 6.03* 5.58* 6.88* 5.62* 5.89*  CALCIUM 8.5* 7.7* 8.2* 8.0* 8.0*    CBG:  Recent Labs Lab 11/20/14 2147 11/21/14 0806 11/21/14 1245 11/21/14 1259 11/21/14 1314  GLUCAP 429* 87 59* 54* 73    GFR Estimated Creatinine Clearance: 14.8 mL/min (by C-G formula based on Cr of 5.89).  Coagulation profile  Recent Labs Lab 11/19/14 0318 11/19/14 0550 11/20/14 0605 11/21/14 0628 11/21/14 0830  INR 6.46* 6.53* 3.06* >10.00* 1.66*    Cardiac Enzymes No results for input(s): CKMB, TROPONINI,  MYOGLOBIN in the last 168 hours.  Invalid input(s): CK  Invalid input(s): POCBNP No results for input(s): DDIMER in the last 72 hours. No results for input(s): HGBA1C in the last 72 hours. No results for input(s): CHOL, HDL, LDLCALC, TRIG, CHOLHDL, LDLDIRECT in the last 72 hours.  Recent Labs  11/21/14 1347  TSH 1.386    Recent Labs  11/20/14 1102 11/21/14 1347  VITAMINB12  --  1116*  FOLATE  --  24.1  FERRITIN 2764*  --    No results for input(s): LIPASE, AMYLASE in the last 72 hours.  Urine Studies No results for input(s): UHGB, CRYS in the last 72 hours.  Invalid input(s): UACOL, UAPR, USPG, UPH, UTP, UGL, UKET, UBIL, UNIT, UROB, ULEU, UEPI, UWBC, URBC, UBAC, CAST, UCOM, BILUA  MICROBIOLOGY: Recent Results (from the past 240 hour(s))  Blood culture (routine x 2)     Status: None (Preliminary result)   Collection Time: 11/18/14  6:30 AM  Result Value Ref Range Status   Specimen Description BLOOD LEFT ARM  Final   Special Requests BOTTLES DRAWN AEROBIC AND ANAEROBIC 5CC  Final   Culture NO GROWTH 3 DAYS  Final   Report Status PENDING  Incomplete  Blood culture (routine x 2)     Status: None (Preliminary result)   Collection Time: 11/18/14  6:30 AM  Result Value Ref Range Status   Specimen Description BLOOD LEFT HAND  Final   Special Requests BOTTLES DRAWN AEROBIC AND ANAEROBIC 5CC  Final   Culture NO GROWTH 3 DAYS  Final   Report Status PENDING  Incomplete  Urine culture     Status: None   Collection Time: 11/18/14 11:30 AM  Result Value Ref Range Status   Specimen Description URINE, RANDOM  Final   Special Requests NONE  Final   Culture   Final    >=100,000 COLONIES/mL ENTEROCOCCUS SPECIES 30,000 COLONIES/mL ESCHERICHIA COLI    Report Status 11/21/2014 FINAL  Final   Organism ID, Bacteria ENTEROCOCCUS SPECIES  Final   Organism ID, Bacteria ESCHERICHIA COLI  Final      Susceptibility   Escherichia coli - MIC*    AMPICILLIN >=32 RESISTANT Resistant      CEFAZOLIN >=64 RESISTANT Resistant     CEFTRIAXONE <=1 SENSITIVE Sensitive     CIPROFLOXACIN <=0.25 SENSITIVE Sensitive     GENTAMICIN <=1 SENSITIVE Sensitive     IMIPENEM <=0.25 SENSITIVE Sensitive     NITROFURANTOIN <=16 SENSITIVE Sensitive     TRIMETH/SULFA <=20 SENSITIVE Sensitive     AMPICILLIN/SULBACTAM 8 SENSITIVE Sensitive     PIP/TAZO <=4 SENSITIVE Sensitive     * 30,000 COLONIES/mL ESCHERICHIA COLI   Enterococcus species - MIC*    AMPICILLIN <=2 SENSITIVE Sensitive     LEVOFLOXACIN 1 SENSITIVE Sensitive     NITROFURANTOIN <=16 SENSITIVE Sensitive     VANCOMYCIN 2 SENSITIVE Sensitive     LINEZOLID 1 SENSITIVE Sensitive     * >=100,000 COLONIES/mL ENTEROCOCCUS SPECIES  MRSA PCR Screening     Status: None   Collection Time: 11/18/14 11:30 AM  Result Value Ref Range Status   MRSA by PCR NEGATIVE NEGATIVE Final    Comment:        The GeneXpert MRSA Assay (FDA approved for NASAL specimens only), is one component of a comprehensive MRSA colonization surveillance program. It is not intended to diagnose MRSA infection nor to guide or monitor treatment for MRSA infections.   Clostridium Difficile by PCR (not at [facility])     Status: None   Collection Time: 11/19/14  3:50 PM  Result Value Ref Range Status   Toxigenic C Difficile by pcr NEGATIVE NEGATIVE Final    RADIOLOGY STUDIES/RESULTS: Dg Chest 2 View  11/18/2014   CLINICAL DATA:  Altered mental status. Shortness of breath and nausea today.  EXAM: CHEST  2 VIEW  COMPARISON:  11/05/2014  FINDINGS: Chronic cardiomegaly and vascular congestion, unchanged. Diminished right pleural effusion and basilar opacity from prior exam. No new confluent airspace disease or pneumothorax. No acute osseous abnormalities.  IMPRESSION: 1. Diminished right pleural effusion and right basilar opacity. 2. Stable chronic cardiomegaly and vascular congestion.   Electronically Signed  By: Jeb Levering M.D.   On: 11/18/2014 06:21   Dg Chest 2  View  11/05/2014   CLINICAL DATA:  Dyspnea for 1 day  EXAM: CHEST  2 VIEW  COMPARISON:  July 03, 2014  FINDINGS: The heart size and mediastinal contours are stable. There is mild patchy consolidation of lateral right lung base with small right pleural effusion. There is mild chronic central pulmonary vascular congestion. The visualized skeletal structures are stable.  IMPRESSION: Chronic central pulmonary vascular congestion.  Small right pleural effusion with associated mild patchy opacity of right lung base which could be due to atelectasis but superimposed pneumonia is not excluded.   Electronically Signed   By: Abelardo Diesel M.D.   On: 11/05/2014 21:54   Ct Head Wo Contrast  11/18/2014   CLINICAL DATA:  Altered mental status.  EXAM: CT HEAD WITHOUT CONTRAST  TECHNIQUE: Contiguous axial images were obtained from the base of the skull through the vertex without intravenous contrast.  COMPARISON:  None.  FINDINGS: No intracranial hemorrhage, mass effect, or midline shift. Small focal encephalomalacia in the left occipital lobe. No hydrocephalus. The basilar cisterns are patent. No evidence of territorial infarct. No intracranial fluid collection. Calvarium is intact. Included paranasal sinuses and mastoid air cells are well aerated.  IMPRESSION: 1.  No acute intracranial abnormality. 2. Small focal encephalomalacia in the left occipital lobe.   Electronically Signed   By: Jeb Levering M.D.   On: 11/18/2014 06:13   US Abdomen Complete  11/06/2014   CLINICAL DATA:  Abnormal liver function tests  EXAM: ULTRASOUND ABDOMEN COMPLETE  COMPARISON:  None.  FINDINGS: Gallbladder: No gallstones or wall thickening visualized. No sonographic Murphy sign noted.  Common bile duct: Diameter: 4 mm  Liver: Minimally nodular contour with no focal abnormalities.  IVC: No abnormality visualized.  Pancreas: Visualized portion unremarkable.  Spleen: Size and appearance within normal limits.  Right Kidney: Length: 10.7 cm.  Midpole cyst measuring 14 mm. Mildly increased echogenicity.  Left Kidney: Length: 10.8 cm. Lower pole cyst measures 1 cm. Mildly increased echogenicity.  Abdominal aorta: No aneurysm identified. Limited visualization distally due to bowel gas.  Other findings: Right pleural effusion.  Small volume ascites.  IMPRESSION: Mildly nodular liver contour with small volume of ascites and right pleural effusion. Possibility of cirrhosis not excluded.  Evidence of medical renal disease.   Electronically Signed   By: Skipper Cliche M.D.   On: 11/06/2014 21:38    Oren Binet, MD  [facility] Pager:[phone]  If 7PM-7AM, please contact night-coverage [email or web address] Password [email or web address] 11/21/2014, 5:02 PM   LOS: 3 days

## 2014-11-21 NOTE — Progress Notes (Signed)
Results for NICKOLI, BAGHERI (MRN [id number]) as of 11/21/2014 13:20  Ref. Range 11/20/2014 21:47 11/21/2014 08:06 11/21/2014 12:45 11/21/2014 12:59 11/21/2014 13:14  Glucose-Capillary Latest Ref Range: 65-99 mg/dL 429 (H) 87 59 (L) 54 (L) 73  Noted that patient was given a total of 50 units of Novolog yesterday (8/2) evening over a 4-5 hour time period due to elevated blood sugars.  CBG this am was 60 mg/dl. Will continue to follow blood sugars. May need to decrease Lantus to 30 units every HS if blood sugars continue to be less than 100 mg/dl.  Harvel Ricks RN BSN CDE

## 2014-11-21 NOTE — Telephone Encounter (Signed)
Will send to Dr Aundra Dubin for review, pt is currently in the hospital

## 2014-11-21 NOTE — Progress Notes (Signed)
Lab redrew INR result: 1.66 -Dr is aware

## 2014-11-21 NOTE — Progress Notes (Signed)
Notified Dr INR >10.0

## 2014-11-21 NOTE — Progress Notes (Signed)
Hemodialysis- System clotted(dialyzer) with 24 minutes remaining, ok to DC treatment per NP. All blood was able to be given back. While placing back on 5w heart monitor pt noted to be diaphoretic. CBG checked =59. Given grape juice, refused graham crackers. Will recheck prior to transport back to room. All vitals wnl. Pt has no complaints, alert and oriented to self.

## 2014-11-21 NOTE — Consult Note (Signed)
NEURO HOSPITALIST CONSULT NOTE   Referring physician: Ghimire   Reason for Consult: AMS  HPI:                                                                                                                                          Joseph [name] is an 59 y.o. male with end-stage renal disease (HD on M/W/F), C. difficile (actively being treated), A. fib, diabetes mellitus, and obstructive sleep apnea who was brought to the emergency department by his wife for confusion, elevated CBGs and nausea. He was discharged from [facility] on 7/22 after being treated for C. difficile and volume overload. He was supposed to finish his oral vancomycin on 8/1. The patient's stools were improving but over the last few days have become more frequent and soft again. Yesterday he had 4 stools and was unable to make it to the toilet in time. Also over the last 2 days he has become very confused and sleepy. patient was admitted to [facility] hospital. On admission his CBG was 1006, NA 122, WBC 17.9.  CT head was negative. On 8/2 his CBG had decreased to 438 and then next morning dropped to 87.  He is on HD and has received HD with Cr 5.89  His WBC has remained elevated at 15.6. He currently remains on C.diff protocol and being treated with Vancomycin. He has remain confused while in hospital and neuro was asked to evaluate.   Past Medical History  Diagnosis Date  . Atrial fibrillation     a. 11/18/2011 s/p DCCV - 150J;  b. Anticoagulation w/ Apixaban;  c. 11/2011 back in afib->asymptomatic.  . H/O alcohol abuse     a. drinks heavily on the weekends.  . Hypertension   . Diabetes mellitus   . Heart murmur     a. 09/2011 Echo: EF 55-60%, Triv AI, Mild MR, mildly dil LA.  . Diabetic peripheral neuropathy   . CKD (chronic kidney disease), stage III   . CHF (congestive heart failure)   . Pneumonia   . Sleep apnea     Past Surgical History  Procedure Laterality Date  . Eye sx  11/11/2011     left eye  . Cardioversion  11/18/2011    Procedure: CARDIOVERSION;  Surgeon: Josue Hector, MD;  Location: [facility] ENDOSCOPY;  Service: Cardiovascular;  Laterality: N/A;  . Colonoscopy w/ biopsies and polypectomy    . Av fistula placement Left 02/13/2014    Procedure: INSERTION OF ARTERIOVENOUS (AV) GORE-TEX GRAFT ARM;  Surgeon: Angelia Mould, MD;  Location: [place];  Service: Vascular;  Laterality: Left;  . Right heart catheterization N/A 10/11/2013    Procedure: RIGHT HEART CATH;  Surgeon: Larey Dresser, MD;  Location: [facility] CATH LAB;  Service: Cardiovascular;  Laterality: N/A;    Family History  Problem Relation Age of Onset  . Heart disease Mother     before age 3  . Diabetes Father   . Diabetes Sister   . Peripheral vascular disease Sister     amputation  . Cancer Neg Hx   . Stroke Neg Hx      Social History:  reports that he quit smoking about 20 years ago. His smoking use included Cigarettes. He has a .05 pack-year smoking history. He has never used smokeless tobacco. He reports that he does not drink alcohol or use illicit drugs.  Allergies  Allergen Reactions  . Dilaudid [Hydromorphone] Nausea And Vomiting    MEDICATIONS:                                                                                                                     Prior to Admission:  Prescriptions prior to admission  Medication Sig Dispense Refill Last Dose  . atorvastatin (LIPITOR) 40 MG tablet Take 40 mg by mouth daily.   11/17/2014 at Unknown time  . b complex-vitamin c-folic acid (NEPHRO-VITE) 0.8 MG TABS tablet Take 1 tablet by mouth daily.   11/17/2014 at Unknown time  . clobetasol cream (TEMOVATE) 0.16 % Apply 1 application topically 2 (two) times daily.  2 11/17/2014 at Unknown time  . colchicine 0.6 MG tablet TAKE 1 TABLET (0.6 MG TOTAL) BY MOUTH 2 (TWO) TIMES DAILY. 60 tablet 2 11/17/2014 at Unknown time  . diltiazem (CARDIZEM CD) 120 MG 24 hr capsule Take 1 capsule (120 mg total) by mouth  daily. 30 capsule 0 11/17/2014 at Unknown time  . ethyl chloride spray Apply 1 application topically every other day. Mon Wed Fri - done with dialysis  6 Past Week at Unknown time  . LANTUS SOLOSTAR 100 UNIT/ML injection Inject 50 Units into the skin daily.    11/17/2014 at Unknown time  . metoprolol (LOPRESSOR) 100 MG tablet Take 1 tablet (100 mg total) by mouth at bedtime. (Patient taking differently: Take 100 mg by mouth See admin instructions. Take twice daily except on dialysis days.) 30 tablet 1 11/17/2014 at 1930  . Nutritional Supplements (FEEDING SUPPLEMENT, NEPRO CARB STEADY,) LIQD Take 237 mLs by mouth 2 (two) times daily between meals. 20 Can 0 Past Month at Unknown time  . ondansetron (ZOFRAN) 4 MG tablet Take 4 mg by mouth 3 (three) times daily as needed for nausea or vomiting.   11/17/2014 at Unknown time  . saccharomyces boulardii (FLORASTOR) 250 MG capsule Take 1 capsule (250 mg total) by mouth 2 (two) times daily. (Patient taking differently: Take 250 mg by mouth daily. ) 30 capsule 0 11/17/2014 at Unknown time  . vancomycin (VANCOCIN) 125 MG capsule Take 125 mg by mouth 4 (four) times daily. Take for 10 days starting 11/09/14   11/17/2014 at Unknown time  . warfarin (COUMADIN) 5 MG tablet Take 1 tablet (5 mg total) by mouth daily. 30 tablet 0 11/17/2014 at Unknown time  .  ONETOUCH VERIO test strip    other  . sildenafil (VIAGRA) 100 MG tablet Take 100 mg by mouth daily as needed for erectile dysfunction.   unknown   Scheduled: . antiseptic oral rinse  7 mL Mouth Rinse 6 times per day  . calcitRIOL  1.75 mcg Oral Q M,W,F-HD  . clobetasol cream  1 application Topical BID  . [START ON 11/22/2014] colchicine  0.3 mg Oral Once per day on Mon Thu  . diltiazem  30 mg Oral 4 times per day  . feeding supplement (NEPRO CARB STEADY)  237 mL Oral BID BM  . insulin aspart  0-9 Units Subcutaneous TID WC  . insulin glargine  35 Units Subcutaneous BID  . metoprolol  25 mg Oral BID  . multivitamin  1  tablet Oral QHS  . saccharomyces boulardii  250 mg Oral BID  . sevelamer carbonate  1,600 mg Oral TID WC  . vancomycin  500 mg Oral 4 times per day  . warfarin  2.5 mg Oral ONCE-1800  . Warfarin - Pharmacist Dosing Inpatient   Does not apply q1800     ROS:                                                                                                                                       History obtained from chart review  General ROS: negative for - chills, fatigue, fever, night sweats, weight gain or weight loss Psychological ROS: negative for - behavioral disorder, hallucinations, memory difficulties, mood swings or suicidal ideation Ophthalmic ROS: negative for - blurry vision, double vision, eye pain or loss of vision ENT ROS: negative for - epistaxis, nasal discharge, oral lesions, sore throat, tinnitus or vertigo Allergy and Immunology ROS: negative for - hives or itchy/watery eyes Hematological and Lymphatic ROS: negative for - bleeding problems, bruising or swollen lymph nodes Endocrine ROS: negative for - galactorrhea, hair pattern changes, polydipsia/polyuria or temperature intolerance Respiratory ROS: negative for - cough, hemoptysis, shortness of breath or wheezing Cardiovascular ROS: negative for - chest pain, dyspnea on exertion, edema or irregular heartbeat Gastrointestinal ROS: negative for - abdominal pain, diarrhea, hematemesis, nausea/vomiting or stool incontinence Genito-Urinary ROS: negative for - dysuria, hematuria, incontinence or urinary frequency/urgency Musculoskeletal ROS: negative for - joint swelling or muscular weakness Neurological ROS: as noted in HPI Dermatological ROS: negative for rash and skin lesion changes   Blood pressure 145/60, pulse 99, temperature 98.6 F (37 C), temperature source Oral, resp. rate 16, height 6' (1.829 m), weight 79.6 kg (175 lb 7.8 oz), SpO2 98 %.   Neurologic Examination:  HEENT-  Normocephalic, no lesions, without obvious abnormality.  Normal external eye and conjunctiva.  Normal TM's bilaterally.  Normal auditory canals and external ears. Normal external nose, mucus membranes and septum.  Normal pharynx. Cardiovascular- irregularly irregular rhythm, pulses palpable throughout   Lungs- chest clear, no wheezing, rales, normal symmetric air entry Abdomen- normal findings: bowel sounds normal Extremities- no edema Lymph-no adenopathy palpable Musculoskeletal-no joint tenderness, deformity or swelling Skin-warm and dry, no hyperpigmentation, vitiligo, or suspicious lesions  Neurological Examination Mental Status: Alert, not oriented to hospital, city, year, month.  Able to hold conversation.  He is able to state how many quarters are in 2 dollars. Speech fluent without evidence of aphasia.  Able to follow 3 step commands without difficulty. Cranial Nerves: II: Discs flat bilaterally; Visual fields grossly normal, pupils equal, round, reactive to light and accommodation III,IV, VI: ptosis not present, extra-ocular motions intact bilaterally V,VII: smile symmetric, facial light touch sensation normal bilaterally VIII: hearing normal bilaterally IX,X: uvula rises symmetrically XI: bilateral shoulder shrug XII: midline tongue extension Motor: Right : Upper extremity   5/5    Left:     Upper extremity   5/5  Lower extremity   5/5     Lower extremity   5/5 Tone and bulk:normal tone throughout; no atrophy noted Sensory: Pinprick and light touch intact throughout, bilaterally Deep Tendon Reflexes: 2+ and symmetric throughout Plantars: Right: downgoing   Left: downgoing Cerebellar: normal finger-to-nose,  normal heel-to-shin test Gait: not tested due to safety      Lab Results: Basic Metabolic Panel:  Recent Labs Lab 11/18/14 1620 11/18/14 1958 11/19/14 0550 11/20/14 1102 11/21/14 0800 11/21/14 0830   NA 130* 132* 127* 132*  --  134*  K 4.9 4.8 5.9* 4.7  --  4.0  CL 92* 96* 92* 97*  --  99*  CO2 22 23 21* 21*  --  23  GLUCOSE 276* 223* 263* 307*  --  60*  BUN 83* 83* 94* 72*  --  88*  CREATININE 6.03* 5.58* 6.88* 5.62*  --  5.89*  CALCIUM 8.5* 7.7* 8.2* 8.0*  --  8.0*  PHOS  --   --  8.8*  --  6.1*  --     Liver Function Tests:  Recent Labs Lab 11/15/14 1622 11/18/14 0422 11/19/14 0550 11/20/14 0605 11/20/14 1102 11/21/14 0800  AST 172* 135*  --  178* 161*  --   ALT 227* 232*  --  232* 230*  --   ALKPHOS 217* 206*  --  181* 180*  --   BILITOT 1.3* 1.5*  --  1.3* 1.5*  --   PROT 6.1 6.0*  --  5.8* 6.0*  --   ALBUMIN 3.4* 3.0* 3.1* 3.0* 3.3* 3.0*    Recent Labs Lab 11/18/14 0422  LIPASE 97*    Recent Labs Lab 11/18/14 0635 11/20/14 1102  AMMONIA 35 44*    CBC:  Recent Labs Lab 11/18/14 0422 11/19/14 0318 11/19/14 0550 11/20/14 0605 11/21/14 0830  WBC 17.9* 31.7* 27.1* 16.3* 15.6*  HGB 12.4* 13.2 12.7* 12.7* 11.9*  HCT 38.1* 39.8 38.1* 37.9* 34.3*  MCV 95.7 95.9 93.6 94.5 90.7  PLT 209 253 229 193 178    Cardiac Enzymes: No results for input(s): CKTOTAL, CKMB, CKMBINDEX, TROPONINI in the last 168 hours.  Lipid Panel: No results for input(s): CHOL, TRIG, HDL, CHOLHDL, VLDL, LDLCALC in the last 168 hours.  CBG:  Recent Labs Lab 11/20/14 2147 11/21/14 0806 11/21/14 1245 11/21/14 1259 11/21/14  [place]    Microbiology: Results for orders placed or performed during the hospital encounter of 11/18/14  Blood culture (routine x 2)     Status: None (Preliminary result)   Collection Time: 11/18/14  6:30 AM  Result Value Ref Range Status   Specimen Description BLOOD LEFT ARM  Final   Special Requests BOTTLES DRAWN AEROBIC AND ANAEROBIC 5CC  Final   Culture NO GROWTH 2 DAYS  Final   Report Status PENDING  Incomplete  Blood culture (routine x 2)     Status: None (Preliminary result)   Collection Time: 11/18/14  6:30 AM   Result Value Ref Range Status   Specimen Description BLOOD LEFT HAND  Final   Special Requests BOTTLES DRAWN AEROBIC AND ANAEROBIC 5CC  Final   Culture NO GROWTH 2 DAYS  Final   Report Status PENDING  Incomplete  Urine culture     Status: None   Collection Time: 11/18/14 11:30 AM  Result Value Ref Range Status   Specimen Description URINE, RANDOM  Final   Special Requests NONE  Final   Culture   Final    >=100,000 COLONIES/mL ENTEROCOCCUS SPECIES 30,000 COLONIES/mL ESCHERICHIA COLI    Report Status 11/21/2014 FINAL  Final   Organism ID, Bacteria ENTEROCOCCUS SPECIES  Final   Organism ID, Bacteria ESCHERICHIA COLI  Final      Susceptibility   Escherichia coli - MIC*    AMPICILLIN >=32 RESISTANT Resistant     CEFAZOLIN >=64 RESISTANT Resistant     CEFTRIAXONE <=1 SENSITIVE Sensitive     CIPROFLOXACIN <=0.25 SENSITIVE Sensitive     GENTAMICIN <=1 SENSITIVE Sensitive     IMIPENEM <=0.25 SENSITIVE Sensitive     NITROFURANTOIN <=16 SENSITIVE Sensitive     TRIMETH/SULFA <=20 SENSITIVE Sensitive     AMPICILLIN/SULBACTAM 8 SENSITIVE Sensitive     PIP/TAZO <=4 SENSITIVE Sensitive     * 30,000 COLONIES/mL ESCHERICHIA COLI   Enterococcus species - MIC*    AMPICILLIN <=2 SENSITIVE Sensitive     LEVOFLOXACIN 1 SENSITIVE Sensitive     NITROFURANTOIN <=16 SENSITIVE Sensitive     VANCOMYCIN 2 SENSITIVE Sensitive     LINEZOLID 1 SENSITIVE Sensitive     * >=100,000 COLONIES/mL ENTEROCOCCUS SPECIES  MRSA PCR Screening     Status: None   Collection Time: 11/18/14 11:30 AM  Result Value Ref Range Status   MRSA by PCR NEGATIVE NEGATIVE Final    Comment:        The GeneXpert MRSA Assay (FDA approved for NASAL specimens only), is one component of a comprehensive MRSA colonization surveillance program. It is not intended to diagnose MRSA infection nor to guide or monitor treatment for MRSA infections.   Clostridium Difficile by PCR (not at [facility])     Status: None   Collection Time:  11/19/14  3:50 PM  Result Value Ref Range Status   Toxigenic C Difficile by pcr NEGATIVE NEGATIVE Final    Coagulation Studies:  Recent Labs  11/19/14 0318 11/19/14 0550 11/20/14 0605 11/21/14 0628 11/21/14 0830  LABPROT 54.5* 54.9* 31.1* >90.0* 19.6*  INR 6.46* 6.53* 3.06* >10.00* 1.66*    Imaging: No results found.     Assessment and plan per attending neurologist  Etta Quill PA-C [facility] [phone]  11/21/2014, 1:34 PM   Assessment/Plan:  [name] with AMS in the setting of C. Diff, hyperglycemia, hyponatremia and elevated WBC.  Exam is non-focal other than confusion.  AMS likely  secondary to toxic metabolic encephalopathy, neuro recovery will likely lag behind correction of his metabolic abnormalities. Given his Afib would also obtain MRI brain to evaluate for shower of emboli and EEG for seizure.   Recommend: 1) MRI brain 2) EEG 3) B12, Folate, RPR   Jim Like, DO [facility] [phone]  If 7pm- 7am, please page neurology on call as listed in AMION.

## 2014-11-22 ENCOUNTER — Inpatient Hospital Stay (HOSPITAL_COMMUNITY): Payer: Medicare Other

## 2014-11-22 DIAGNOSIS — I480 Paroxysmal atrial fibrillation: Secondary | ICD-10-CM

## 2014-11-22 LAB — RENAL FUNCTION PANEL
ANION GAP: 12 (ref 5–15)
Albumin: 2.9 g/dL — ABNORMAL LOW (ref 3.5–5.0)
BUN: 46 mg/dL — AB (ref 6–20)
CO2: 24 mmol/L (ref 22–32)
CREATININE: 3.97 mg/dL — AB (ref 0.61–1.24)
Calcium: 7.6 mg/dL — ABNORMAL LOW (ref 8.9–10.3)
Chloride: 97 mmol/L — ABNORMAL LOW (ref 101–111)
GFR, EST AFRICAN AMERICAN: 18 mL/min — AB (ref >60)
GFR, EST NON AFRICAN AMERICAN: 15 mL/min — AB (ref >60)
Glucose, Bld: 54 mg/dL — ABNORMAL LOW (ref 65–99)
Phosphorus: 4.1 mg/dL (ref 2.5–4.6)
Potassium: 3.7 mmol/L (ref 3.5–5.1)
SODIUM: 133 mmol/L — AB (ref 135–145)

## 2014-11-22 LAB — CBC
HEMATOCRIT: 34.2 % — AB (ref 39.0–52.0)
Hemoglobin: 11.3 g/dL — ABNORMAL LOW (ref 13.0–17.0)
MCH: 30.5 pg (ref 26.0–34.0)
MCHC: 33 g/dL (ref 30.0–36.0)
MCV: 92.4 fL (ref 78.0–100.0)
PLATELETS: 142 10*3/uL — AB (ref 150–400)
RBC: 3.7 MIL/uL — AB (ref 4.22–5.81)
RDW: 17.3 % — ABNORMAL HIGH (ref 11.5–15.5)
WBC: 13.9 10*3/uL — ABNORMAL HIGH (ref 4.0–10.5)

## 2014-11-22 LAB — GLUCOSE, CAPILLARY
GLUCOSE-CAPILLARY: 46 mg/dL — AB (ref 65–99)
GLUCOSE-CAPILLARY: 74 mg/dL (ref 65–99)
GLUCOSE-CAPILLARY: 109 mg/dL — AB (ref 65–99)
Glucose-Capillary: 127 mg/dL — ABNORMAL HIGH (ref 65–99)
Glucose-Capillary: 143 mg/dL — ABNORMAL HIGH (ref 65–99)

## 2014-11-22 LAB — RPR: RPR Ser Ql: NONREACTIVE

## 2014-11-22 LAB — HEPARIN LEVEL (UNFRACTIONATED): Heparin Unfractionated: 2.1 IU/mL — ABNORMAL HIGH (ref 0.30–0.70)

## 2014-11-22 LAB — PROTIME-INR
INR: 1.45 (ref 0.00–1.49)
PROTHROMBIN TIME: 17.8 s — AB (ref 11.6–15.2)

## 2014-11-22 MED ORDER — PHENYTOIN SODIUM 50 MG/ML IJ SOLN: 100.0000 mg | Freq: Three times a day (TID) | INTRAMUSCULAR | Status: DC

## 2014-11-22 MED ORDER — INSULIN GLARGINE 100 UNIT/ML ~~LOC~~ SOLN
20.0000 [IU] | Freq: Every day | SUBCUTANEOUS | Status: DC
  Administered 2014-11-22 – 2014-11-24 (×2): 20 [IU] via SUBCUTANEOUS
  Filled 2014-11-22 (×2): qty 0.2

## 2014-11-22 MED ORDER — PHYTONADIONE 5 MG PO TABS
5.0000 mg | ORAL_TABLET | Freq: Once | ORAL | Status: AC
  Administered 2014-11-22: 5 mg via ORAL
  Filled 2014-11-22: qty 1

## 2014-11-22 MED ORDER — DEXTROSE 5 % IV SOLN
400.0000 mg | INTRAVENOUS | Status: DC
  Administered 2014-11-22 – 2014-11-24 (×3): 400 mg via INTRAVENOUS
  Filled 2014-11-22 (×4): qty 8

## 2014-11-22 MED ORDER — SODIUM CHLORIDE 0.9 % IV SOLN
50.0000 mg | Freq: Two times a day (BID) | INTRAVENOUS | Status: DC
  Filled 2014-11-22: qty 5

## 2014-11-22 MED ORDER — PHENYTOIN SODIUM 50 MG/ML IJ SOLN
100.0000 mg | Freq: Three times a day (TID) | INTRAMUSCULAR | Status: DC
  Administered 2014-11-22 – 2014-11-28 (×17): 100 mg via INTRAVENOUS
  Filled 2014-11-22 (×22): qty 2

## 2014-11-22 MED ORDER — ACYCLOVIR 200 MG PO CAPS
200.0000 mg | ORAL_CAPSULE | Freq: Three times a day (TID) | ORAL | Status: DC
  Filled 2014-11-22 (×3): qty 1

## 2014-11-22 MED ORDER — HEPARIN (PORCINE) IN NACL 100-0.45 UNIT/ML-% IJ SOLN
1150.0000 [IU]/h | INTRAMUSCULAR | Status: AC
  Administered 2014-11-22: 1150 [IU]/h via INTRAVENOUS
  Filled 2014-11-22: qty 250

## 2014-11-22 MED ORDER — INSULIN GLARGINE 100 UNIT/ML ~~LOC~~ SOLN: 20.0000 [IU] | Freq: Every day | SUBCUTANEOUS | Status: DC

## 2014-11-22 MED ORDER — DEXTROSE 5 % IV SOLN
800.0000 mg | Freq: Two times a day (BID) | INTRAVENOUS | Status: DC
  Filled 2014-11-22 (×2): qty 16

## 2014-11-22 MED ORDER — LACOSAMIDE 200 MG/20ML IV SOLN
100.0000 mg | Freq: Once | INTRAVENOUS | Status: AC
  Administered 2014-11-22: 100 mg via INTRAVENOUS
  Filled 2014-11-22: qty 10

## 2014-11-22 MED ORDER — SODIUM CHLORIDE 0.9 % IV SOLN
10.0000 mg/kg | Freq: Once | INTRAVENOUS | Status: AC
  Administered 2014-11-22: 795 mg via INTRAVENOUS
  Filled 2014-11-22: qty 15.9

## 2014-11-22 NOTE — Progress Notes (Signed)
Patient ID: Joseph [name], male   DOB: October 03, 1955, 59 y.o.   MRN: [id number]  [facility] Progress Note    Subjective:   Still with some confusion   Objective:   BP 141/80 mmHg  Pulse 97  Temp(Src) 98.9 F (37.2 C) (Oral)  Resp 18  Ht 6' (1.829 m)  Wt 79.6 kg (175 lb 7.8 oz)  BMI 23.79 kg/m2  SpO2 96%  Intake/Output: I/O last 3 completed shifts: In: 1140 [P.O.:1140] Out: 1740 [Urine:2; Other:1735; Stool:3]   Intake/Output this shift:    Weight change:   Physical Exam: Gen:WD WN AAM in NAD but remains oriented to person only  CVS:no rub Resp:cta TOI:ZTIWPY Ext:L forearm AVG,+T/F  Labs: BMET  Recent Labs Lab 11/15/14 1622 11/18/14 0422 11/18/14 1007 11/18/14 1620 11/18/14 1958 11/19/14 0550 11/20/14 0605 11/20/14 1102 11/21/14 0800 11/21/14 0830 11/22/14 0511  NA 133* 122* 130* 130* 132* 127*  --  132*  --  134* 133*  K 4.4 5.8* 3.7 4.9 4.8 5.9*  --  4.7  --  4.0 3.7  CL 94* 84* 101 92* 96* 92*  --  97*  --  99* 97*  CO2 27 21* 19* 22 23 21*  --  21*  --  23 24  GLUCOSE 703* 1006* 678* 276* 223* 263*  --  307*  --  60* 54*  BUN 60* 77* 65* 83* 83* 94*  --  72*  --  88* 46*  CREATININE 4.61* 5.31* 4.29* 6.03* 5.58* 6.88*  --  5.62*  --  5.89* 3.97*  ALBUMIN 3.4* 3.0*  --   --   --  3.1* 3.0* 3.3* 3.0*  --  2.9*  CALCIUM 8.7 8.3* 6.2* 8.5* 7.7* 8.2*  --  8.0*  --  8.0* 7.6*  PHOS  --   --   --   --   --  8.8*  --   --  6.1*  --  4.1   CBC  Recent Labs Lab 11/19/14 0550 11/20/14 0605 11/21/14 0830 11/22/14 0511  WBC 27.1* 16.3* 15.6* 13.9*  HGB 12.7* 12.7* 11.9* 11.3*  HCT 38.1* 37.9* 34.3* 34.2*  MCV 93.6 94.5 90.7 92.4  PLT 229 193 178 142*    @IMGRELPRIORS @ Medications:    . acyclovir  400 mg Intravenous Q24H  . antiseptic oral rinse  7 mL Mouth Rinse 6 times per day  . calcitRIOL  1.75 mcg Oral Q M,W,F-HD  . clobetasol cream  1 application Topical BID  . colchicine  0.3 mg Oral Once per day on Mon Thu  . diltiazem  30 mg  Oral 4 times per day  . feeding supplement (NEPRO CARB STEADY)  237 mL Oral BID BM  . insulin aspart  0-9 Units Subcutaneous TID WC  . [START ON 11/23/2014] insulin glargine  20 Units Subcutaneous Daily  . metoprolol  25 mg Oral BID  . multivitamin  1 tablet Oral QHS  . phytonadione  5 mg Oral Once  . saccharomyces boulardii  250 mg Oral BID  . sevelamer carbonate  1,600 mg Oral TID WC  . vancomycin  500 mg Oral 4 times per day   Dialysis Orders: [facility] 180 EDW 80 3 K 2.5 Ca 450/800 profile 2 AVGG heparin 6100 no Fe or ESA calcitriol 1.75 Recent labs; 7/27 Hgb 11.6 WBC ^ at 12.9 89% N ferritin 1122 42% sat Ca/P ok iPTH 427 stable hgb A1C 10.7 up from 6.1 in April and 7.4 in January  Assessment/ Plan:   1. DKA- Improved but DM has been poorly controlled over the last 6 months. 2. C. Diff colitis- on appropriate abx 3. AMS- unclear etiology as this has remained despite improvement of DKA. 1. appreciate Neurology evaluation and input.    4. ESRD- continue with HD qMWF 5. Enterococcal UTI- would recommend treatment given leukocytosis and AMS. Await sensitivities and ask Dr. Linus Salmons for recommendations in light of C. Diff. 6. Anemia of chronic disease- currently ESA on hold due to Hgb >12 7. CKD-MBD: poorly controlled phos, cont with binders 8. Abnormal LFT's- will recheck today 9. Dermatitis- possibly related to hyperphosphatemia.  10. Nutrition:per primary 11. Hypertension: stable 12. A fib- rate controlled, on coumadin  Inari Shin A 11/22/2014, 10:31 AM

## 2014-11-22 NOTE — Progress Notes (Signed)
MEDICATION RELATED CONSULT NOTE - INITIAL   Pharmacy Consult for Dilantin Indication: Seizure  Allergies  Allergen Reactions  . Dilaudid [Hydromorphone] Nausea And Vomiting    Patient Measurements: Height: 6' (182.9 cm) Weight: 175 lb 7.8 oz (79.6 kg) IBW/kg (Calculated) : 77.6  Vital Signs: Temp: 98.9 F (37.2 C) (08/04 0802) Temp Source: Oral (08/04 0802) BP: 141/80 mmHg (08/04 0802) Pulse Rate: 97 (08/04 0802) Intake/Output from previous day: 08/03 0701 - 08/04 0700 In: 800 [P.O.:800] Out: 1739 [Urine:2; Stool:2] Intake/Output from this shift:    Labs:  Recent Labs  11/20/14 0605 11/20/14 1102 11/21/14 0800 11/21/14 0830 11/22/14 0511  WBC 16.3*  --   --  15.6* 13.9*  HGB Joseph.7*  --   --  11.9* 11.3*  HCT 37.9*  --   --  34.3* 34.2*  PLT 193  --   --  178 142*  CREATININE  --  5.62*  --  5.89* 3.97*  PHOS  --   --  6.1*  --  4.1  ALBUMIN 3.0* 3.3* 3.0*  --  2.9*  PROT 5.8* 6.0*  --   --   --   AST 178* 161*  --   --   --   ALT 232* 230*  --   --   --   ALKPHOS 181* 180*  --   --   --   BILITOT 1.3* 1.5*  --   --   --   BILIDIR 0.5  --   --   --   --   IBILI 0.8  --   --   --   --    Estimated Creatinine Clearance: 22 mL/min (by C-G formula based on Cr of 3.97).   Microbiology: Recent Results (from the past 720 hour(s))  Culture, blood (routine x 2)     Status: None   Collection Time: 11/05/14 10:15 PM  Result Value Ref Range Status   Specimen Description BLOOD HAND RIGHT  Final   Special Requests BOTTLES DRAWN AEROBIC AND ANAEROBIC 5CC  Final   Culture NO GROWTH 5 DAYS  Final   Report Status 11/11/2014 FINAL  Final  Culture, blood (routine x 2)     Status: None   Collection Time: 11/05/14 11:01 PM  Result Value Ref Range Status   Specimen Description BLOOD RIGHT FOREARM  Final   Special Requests BOTTLES DRAWN AEROBIC ONLY 1CC  Final   Culture NO GROWTH 5 DAYS  Final   Report Status 11/11/2014 FINAL  Final  Clostridium Difficile by PCR (not at  [facility])     Status: Abnormal   Collection Time: 11/06/14 11:28 AM  Result Value Ref Range Status   Toxigenic C Difficile by pcr POSITIVE (A) NEGATIVE Final    Comment: CRITICAL RESULT CALLED TO, READ BACK BY AND VERIFIED WITH: Chauncey Cruel LEWIS RN Joseph:35 11/06/14 ([email or web address])   Blood culture (routine x 2)     Status: None (Preliminary result)   Collection Time: 11/18/14  6:30 AM  Result Value Ref Range Status   Specimen Description BLOOD LEFT ARM  Final   Special Requests BOTTLES DRAWN AEROBIC AND ANAEROBIC 5CC  Final   Culture NO GROWTH 3 DAYS  Final   Report Status PENDING  Incomplete  Blood culture (routine x 2)     Status: None (Preliminary result)   Collection Time: 11/18/14  6:30 AM  Result Value Ref Range Status   Specimen Description BLOOD LEFT HAND  Final   Special Requests BOTTLES DRAWN AEROBIC  AND ANAEROBIC 5CC  Final   Culture NO GROWTH 3 DAYS  Final   Report Status PENDING  Incomplete  Urine culture     Status: None   Collection Time: 11/18/14 11:30 AM  Result Value Ref Range Status   Specimen Description URINE, RANDOM  Final   Special Requests NONE  Final   Culture   Final    >=100,000 COLONIES/mL ENTEROCOCCUS SPECIES 30,000 COLONIES/mL ESCHERICHIA COLI    Report Status 11/21/2014 FINAL  Final   Organism ID, Bacteria ENTEROCOCCUS SPECIES  Final   Organism ID, Bacteria ESCHERICHIA COLI  Final      Susceptibility   Escherichia coli - MIC*    AMPICILLIN >=32 RESISTANT Resistant     CEFAZOLIN >=64 RESISTANT Resistant     CEFTRIAXONE <=1 SENSITIVE Sensitive     CIPROFLOXACIN <=0.25 SENSITIVE Sensitive     GENTAMICIN <=1 SENSITIVE Sensitive     IMIPENEM <=0.25 SENSITIVE Sensitive     NITROFURANTOIN <=16 SENSITIVE Sensitive     TRIMETH/SULFA <=20 SENSITIVE Sensitive     AMPICILLIN/SULBACTAM 8 SENSITIVE Sensitive     PIP/TAZO <=4 SENSITIVE Sensitive     * 30,000 COLONIES/mL ESCHERICHIA COLI   Enterococcus species - MIC*    AMPICILLIN <=2 SENSITIVE Sensitive     LEVOFLOXACIN 1  SENSITIVE Sensitive     NITROFURANTOIN <=16 SENSITIVE Sensitive     VANCOMYCIN 2 SENSITIVE Sensitive     LINEZOLID 1 SENSITIVE Sensitive     * >=100,000 COLONIES/mL ENTEROCOCCUS SPECIES  MRSA PCR Screening     Status: None   Collection Time: 11/18/14 11:30 AM  Result Value Ref Range Status   MRSA by PCR NEGATIVE NEGATIVE Final    Comment:        The GeneXpert MRSA Assay (FDA approved for NASAL specimens only), is one component of a comprehensive MRSA colonization surveillance program. It is not intended to diagnose MRSA infection nor to guide or monitor treatment for MRSA infections.   Clostridium Difficile by PCR (not at [facility])     Status: None   Collection Time: 11/19/14  3:50 PM  Result Value Ref Range Status   Toxigenic C Difficile by pcr NEGATIVE NEGATIVE Final    Medical History: Past Medical History  Diagnosis Date  . Atrial fibrillation     a. 11/18/2011 s/p DCCV - 150J;  b. Anticoagulation w/ Apixaban;  c. 11/2011 back in afib->asymptomatic.  . H/O alcohol abuse     a. drinks heavily on the weekends.  . Hypertension   . Diabetes mellitus   . Heart murmur     a. 09/2011 Echo: EF 55-60%, Triv AI, Mild MR, mildly dil LA.  . Diabetic peripheral neuropathy   . CKD (chronic kidney disease), stage III   . CHF (congestive heart failure)   . Pneumonia   . Sleep apnea     Assessment: 59 year old [name] to begin Dilantin for seizure activity pending LP tomorrow With ESRD  Goal of Therapy:  Dilantin level = 10 to 20 mcg/dL  Plan:  Dilantin load ordered Dilantin 100 mg iv Q 8 hours Follow up seizure activity, dilantin level in AM to ensure appropriate load, and periodic dilantin levels  Thank you Anette Guarneri, PharmD [phone]  Tad Moore 11/22/2014,2:04 PM

## 2014-11-22 NOTE — Progress Notes (Signed)
Hypoglycemic Event  CBG: 46  Treatment: 15 GM carbohydrate snack at 0833  Symptoms: None  Follow-up CBG: [id number] CBG Result:74  Possible Reasons for Event: Inadequate meal intake  Comments/MD notified:Dr. Rudene Christians  Remember to initiate Hypoglycemia Order Set & complete

## 2014-11-22 NOTE — Procedures (Signed)
History: 59 yo M with altered mental status, abnormal MRI  Sedation: None  Technique: This is a 19 channel routine scalp EEG performed at the bedside with bipolar and monopolar montages arranged in accordance to the international 10/20 system of electrode placement. One channel was dedicated to EKG recording.    Background: The background consists of intermixed alpha and beta with a PDR of 8 Hz seen during wakefulness as well as mild diffuse irregular delta activity. In addition, there are occasional runs of sharp waves in the right temporal region. There are two seizures recorded with similar morphology beginning with Left hemispheric periodic theta morphology discharges with a  Frequency of 1 Hz. This increases in frequency and then there is suddenly bilateral beta activity with a very wide field, then it becomes predominant in the left fronto-temporal regions. There is evolution with appearance of theta and then evolution to delta with an abrupt end. The first lasted approximately 200 seconds, and the second was 150 seconds.   Photic stimulation: Physiologic driving is not performed  EEG Abnormalities: 1) Two seizures with wide left hemispheric onset, rapidly becoming dominant in the left frontotemporal region.  2) Right temporal sharp waves  Clinical Interpretation: This EEG recorded two seizures which appear to arise from the left hemisphere as well as signs of irritability with potential for seizure origination in the right temporal region as well.   Roland Rack, MD [facility] [phone]  If 7pm- 7am, please page neurology on call as listed in [place].

## 2014-11-22 NOTE — Progress Notes (Addendum)
Subjective: No complaints. Patietn remains confused at date, year and place. Follows all commands.   Objective: Current vital signs: BP 141/80 mmHg  Pulse 97  Temp(Src) 98.9 F (37.2 C) (Oral)  Resp 18  Ht 6' (1.829 m)  Wt 79.6 kg (175 lb 7.8 oz)  BMI 23.79 kg/m2  SpO2 96% Vital signs in last 24 hours: Temp:  [98.4 F (36.9 C)-99.5 F (37.5 C)] 98.9 F (37.2 C) (08/04 0802) Pulse Rate:  [87-104] 97 (08/04 0802) Resp:  [16-18] 18 (08/04 0802) BP: (121-156)/(60-90) 141/80 mmHg (08/04 0802) SpO2:  [92 %-98 %] 96 % (08/04 0802) Weight:  [79.6 kg (175 lb 7.8 oz)] 79.6 kg (175 lb 7.8 oz) (08/03 1229)  Intake/Output from previous day: 08/03 0701 - 08/04 0700 In: 800 [P.O.:800] Out: 1739 [Urine:2; Stool:2] Intake/Output this shift:   Nutritional status: Diet renal with fluid restriction Fluid restriction:: 1200 mL Fluid; Room service appropriate?: Yes; Fluid consistency:: Thin  Neurologic Exam: General: NAD Mental Status: Alert,not oriented to person, year or place but calm and follows all commands.  Speech fluent without evidence of aphasia.  Able to follow 3 step commands without difficulty. Cranial Nerves: II:  Visual fields grossly normal, pupils equal, round, reactive to light and accommodation III,IV, VI: ptosis not present, extra-ocular motions intact bilaterally V,VII: smile symmetric, facial light touch sensation normal bilaterally VIII: hearing normal bilaterally IX,X: uvula rises symmetrically XI: bilateral shoulder shrug XII: midline tongue extension without atrophy or fasciculations  Motor: Right : Upper extremity   5/5    Left:     Upper extremity   5/5  Lower extremity   5/5     Lower extremity   5/5 Tone and bulk:normal tone throughout; no atrophy noted Sensory: Pinprick and light touch intact throughout, bilaterally Deep Tendon Reflexes:  Right: Upper Extremity   Left: Upper extremity   biceps (C-5 to C-6) 2/4   biceps (C-5 to C-6) 2/4 tricep (C7)  2/4    triceps (C7) 2/4 Brachioradialis (C6) 2/4  Brachioradialis (C6) 2/4  Lower Extremity Lower Extremity  quadriceps (L-2 to L-4) 2/4   quadriceps (L-2 to L-4) 2/4 Achilles (S1) 2/4   Achilles (S1) 2/4  Plantars: Right: downgoing   Left: downgoing    Lab Results: Basic Metabolic Panel:  Recent Labs Lab 11/18/14 1958 11/19/14 0550 11/20/14 1102 11/21/14 0800 11/21/14 0830 11/22/14 0511  NA 132* 127* 132*  --  134* 133*  K 4.8 5.9* 4.7  --  4.0 3.7  CL 96* 92* 97*  --  99* 97*  CO2 23 21* 21*  --  23 24  GLUCOSE 223* 263* 307*  --  60* 54*  BUN 83* 94* 72*  --  88* 46*  CREATININE 5.58* 6.88* 5.62*  --  5.89* 3.97*  CALCIUM 7.7* 8.2* 8.0*  --  8.0* 7.6*  PHOS  --  8.8*  --  6.1*  --  4.1    Liver Function Tests:  Recent Labs Lab 11/15/14 1622 11/18/14 0422 11/19/14 0550 11/20/14 0605 11/20/14 1102 11/21/14 0800 11/22/14 0511  AST 172* 135*  --  178* 161*  --   --   ALT 227* 232*  --  232* 230*  --   --   ALKPHOS 217* 206*  --  181* 180*  --   --   BILITOT 1.3* 1.5*  --  1.3* 1.5*  --   --   PROT 6.1 6.0*  --  5.8* 6.0*  --   --  ALBUMIN 3.4* 3.0* 3.1* 3.0* 3.3* 3.0* 2.9*    Recent Labs Lab 11/18/14 0422  LIPASE 97*    Recent Labs Lab 11/18/14 0635 11/20/14 1102  AMMONIA 35 44*    CBC:  Recent Labs Lab 11/19/14 0318 11/19/14 0550 11/20/14 0605 11/21/14 0830 11/22/14 0511  WBC 31.7* 27.1* 16.3* 15.6* 13.9*  HGB 13.2 12.7* 12.7* 11.9* 11.3*  HCT 39.8 38.1* 37.9* 34.3* 34.2*  MCV 95.9 93.6 94.5 90.7 92.4  PLT 253 229 193 178 142*    Cardiac Enzymes: No results for input(s): CKTOTAL, CKMB, CKMBINDEX, TROPONINI in the last 168 hours.  Lipid Panel: No results for input(s): CHOL, TRIG, HDL, CHOLHDL, VLDL, LDLCALC in the last 168 hours.  CBG:  Recent Labs Lab 11/21/14 1314 11/21/14 1706 11/21/14 2239 11/22/14 0811 11/22/14 0848  GLUCAP 73 120* 152* 46* 74    Microbiology: Results for orders placed or performed during the  hospital encounter of 11/18/14  Blood culture (routine x 2)     Status: None (Preliminary result)   Collection Time: 11/18/14  6:30 AM  Result Value Ref Range Status   Specimen Description BLOOD LEFT ARM  Final   Special Requests BOTTLES DRAWN AEROBIC AND ANAEROBIC 5CC  Final   Culture NO GROWTH 3 DAYS  Final   Report Status PENDING  Incomplete  Blood culture (routine x 2)     Status: None (Preliminary result)   Collection Time: 11/18/14  6:30 AM  Result Value Ref Range Status   Specimen Description BLOOD LEFT HAND  Final   Special Requests BOTTLES DRAWN AEROBIC AND ANAEROBIC 5CC  Final   Culture NO GROWTH 3 DAYS  Final   Report Status PENDING  Incomplete  Urine culture     Status: None   Collection Time: 11/18/14 11:30 AM  Result Value Ref Range Status   Specimen Description URINE, RANDOM  Final   Special Requests NONE  Final   Culture   Final    >=100,000 COLONIES/mL ENTEROCOCCUS SPECIES 30,000 COLONIES/mL ESCHERICHIA COLI    Report Status 11/21/2014 FINAL  Final   Organism ID, Bacteria ENTEROCOCCUS SPECIES  Final   Organism ID, Bacteria ESCHERICHIA COLI  Final      Susceptibility   Escherichia coli - MIC*    AMPICILLIN >=32 RESISTANT Resistant     CEFAZOLIN >=64 RESISTANT Resistant     CEFTRIAXONE <=1 SENSITIVE Sensitive     CIPROFLOXACIN <=0.25 SENSITIVE Sensitive     GENTAMICIN <=1 SENSITIVE Sensitive     IMIPENEM <=0.25 SENSITIVE Sensitive     NITROFURANTOIN <=16 SENSITIVE Sensitive     TRIMETH/SULFA <=20 SENSITIVE Sensitive     AMPICILLIN/SULBACTAM 8 SENSITIVE Sensitive     PIP/TAZO <=4 SENSITIVE Sensitive     * 30,000 COLONIES/mL ESCHERICHIA COLI   Enterococcus species - MIC*    AMPICILLIN <=2 SENSITIVE Sensitive     LEVOFLOXACIN 1 SENSITIVE Sensitive     NITROFURANTOIN <=16 SENSITIVE Sensitive     VANCOMYCIN 2 SENSITIVE Sensitive     LINEZOLID 1 SENSITIVE Sensitive     * >=100,000 COLONIES/mL ENTEROCOCCUS SPECIES  MRSA PCR Screening     Status: None    Collection Time: 11/18/14 11:30 AM  Result Value Ref Range Status   MRSA by PCR NEGATIVE NEGATIVE Final    Comment:        The GeneXpert MRSA Assay (FDA approved for NASAL specimens only), is one component of a comprehensive MRSA colonization surveillance program. It is not intended to diagnose MRSA infection nor to  guide or monitor treatment for MRSA infections.   Clostridium Difficile by PCR (not at [facility])     Status: None   Collection Time: 11/19/14  3:50 PM  Result Value Ref Range Status   Toxigenic C Difficile by pcr NEGATIVE NEGATIVE Final    Coagulation Studies:  Recent Labs  11/20/14 0605 11/21/14 0628 11/21/14 0830 11/22/14 0511  LABPROT 31.1* >90.0* 19.6* 17.8*  INR 3.06* >10.00* 1.66* 1.45    Imaging: Mr Brain Wo Contrast  11/21/2014   CLINICAL DATA:  Altered mental status. Encephalopathy. Atrial fibrillation.  EXAM: MRI HEAD WITHOUT CONTRAST  TECHNIQUE: Multiplanar, multiecho pulse sequences of the brain and surrounding structures were obtained without intravenous contrast.  COMPARISON:  CT head without contrast from the same day.  FINDINGS: Restricted diffusion and increased T2 signal is present within the medial temporal lobes and hippocampal structures bilaterally. Inferior temporal lobes are intact.  Moderate generalized atrophy and diffuse periventricular T2 changes bilaterally are advanced for age. The ventricles are of normal size.  Flow is present in the major intracranial arteries. The globes and orbits are intact. The paranasal sinuses are clear. There is some fluid in the right mastoid air cells. No obstructing nasopharyngeal lesion is evident.  IMPRESSION: Abnormal T2 signal and restricted diffusion within the medial temporal lobes and hippocampus bilaterally. 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 There is no hemorrhage or signal change along the undersurface the temporal lobes, but herpes encephalitis is also considered. Status epilepticus is  also on the differential diagnosis.  Atrophy in T2 signal changes are advanced for age. This is nonspecific, but likely reflects the sequela of chronic microvascular ischemia.  These results will be called to the ordering clinician or representative by the Radiologist Assistant, and communication documented in the PACS or zVision Dashboard.   Electronically Signed   By: San Morelle M.D.   On: 11/21/2014 19:01   EEG: EEG Abnormalities: 1) Two seizures with wide left hemispheric onset, rapidly becoming dominant in the left frontotemporal region.  2) Right temporal sharp waves  Clinical Interpretation: This EEG recorded two seizures which appear to arise from the left hemisphere as well as signs of irritability with potential for seizure origination in the right temporal region as well.   Medications:  Scheduled: . acyclovir  400 mg Intravenous Q24H  . antiseptic oral rinse  7 mL Mouth Rinse 6 times per day  . calcitRIOL  1.75 mcg Oral Q M,W,F-HD  . clobetasol cream  1 application Topical BID  . colchicine  0.3 mg Oral Once per day on Mon Thu  . diltiazem  30 mg Oral 4 times per day  . feeding supplement (NEPRO CARB STEADY)  237 mL Oral BID BM  . insulin aspart  0-9 Units Subcutaneous TID WC  . insulin glargine  20 Units Subcutaneous Daily  . lacosamide (VIMPAT) IV  100 mg Intravenous Once  . lacosamide (VIMPAT) IV  50 mg Intravenous Q12H  . metoprolol  25 mg Oral BID  . multivitamin  1 tablet Oral QHS  . phytonadione  5 mg Oral Once  . saccharomyces boulardii  250 mg Oral BID  . sevelamer carbonate  1,600 mg Oral TID WC  . vancomycin  500 mg Oral 4 times per day    Assessment/Plan: [name] with AMS in the setting of C. Diff, hyperglycemia, hyponatremia and elevated WBC. Exam is non-focal other than confusion.EEG does show seizure activity and patient will be started on Vimpat.  MRI also  shows  abnormal T2 signal and restricted diffusion within the medial temporal lobes and  hippocampus bilaterally. 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  Recommend: 1) Vimpat 50 mg BID 2) LP in AM--wife has signed consent.  3) Agree with acyclovir pending LP results       Etta Quill PA-C [facility] [phone]  11/22/2014, 11:25 AM   Addendum: LP held until tomorrow due to concern over bleeding risk with recently elevated INR and patient receiving dose of coumadin last night. Started on acyclovir with concern of possible HSV. (addendum added to clarify delay in LP).

## 2014-11-22 NOTE — Progress Notes (Signed)
Physical Therapy Treatment Patient Details Name: Joseph [name] MRN: [id number] DOB: March 11, 1956 Today's Date: 11/22/2014    History of Present Illness Pt adm metabolic encephalopathy. Due to underlying ESRD, sepsis from C. difficile colitis, mild DKA with ? UTI. AMS not resolving and EEG showed seizures and MRI showed ?infectious process. PMH - ESRD on HD, DM, C-diff    PT Comments    Pt making slow progress. Further work-up in progress.  Follow Up Recommendations  Home health PT;Supervision for mobility/OOB     Equipment Recommendations  None recommended by PT    Recommendations for Other Services       Precautions / Restrictions Precautions Precautions: Fall    Mobility  Bed Mobility Overal bed mobility: Needs Assistance Bed Mobility: Supine to Sit     Supine to sit: Min guard     General bed mobility comments: for safety  Transfers Overall transfer level: Needs assistance Equipment used: Rolling walker (2 wheeled) Transfers: Sit to/from Stand Sit to Stand: Min assist;Mod assist;+2 physical assistance         General transfer comment: Min A to bring hips up from bed but +2 mod A to bring hips up from low toilet.  Ambulation/Gait Ambulation/Gait assistance: Min assist Ambulation Distance (Feet): 80 Feet Assistive device: Rolling walker (2 wheeled) Gait Pattern/deviations: Step-through pattern;Decreased stride length;Trunk flexed Gait velocity: decr Gait velocity interpretation: Below normal speed for age/gender General Gait Details: Verbal cues to stand more erect. Pt c/o calf pain with amb which his wife says he has complained about for some time. Assist for balance.    Stairs            Wheelchair Mobility    Modified Rankin (Stroke Patients Only)       Balance Overall balance assessment: Needs assistance Sitting-balance support: No upper extremity supported Sitting balance-Leahy Scale: Good     Standing balance support: Bilateral upper  extremity supported Standing balance-Leahy Scale: Poor Standing balance comment: support of walker and min guard for static standing                    Cognition Arousal/Alertness: Awake/alert Behavior During Therapy: WFL for tasks assessed/performed Overall Cognitive Status: Impaired/Different from baseline Area of Impairment: Attention;Safety/judgement;Problem solving   Current Attention Level: Selective Memory: Decreased recall of precautions;Decreased short-term memory   Safety/Judgement: Decreased awareness of safety;Decreased awareness of deficits   Problem Solving: Requires verbal cues      Exercises      General Comments        Pertinent Vitals/Pain Pain Assessment: Faces Faces Pain Scale: Hurts a little bit Pain Location: bil calves Pain Descriptors / Indicators: Sore Pain Intervention(s): Limited activity within patient's tolerance;Repositioned    Home Living                      Prior Function            PT Goals (current goals can now be found in the care plan section) Acute Rehab PT Goals Patient Stated Goal: Return home PT Goal Formulation: With patient Time For Goal Achievement: 11/27/14 Potential to Achieve Goals: Good    Frequency  Min 3X/week    PT Plan Current plan remains appropriate    Co-evaluation             End of Session Equipment Utilized During Treatment: Gait belt Activity Tolerance: Patient tolerated treatment well Patient left: in chair;with call bell/phone within reach;with family/visitor present;with chair alarm set  Time: 6016-5800 PT Time Calculation (min) (ACUTE ONLY): 24 min  Charges:  $Gait Training: 23-37 mins                    G Codes:      Joseph [name] 12-Dec-2014, 1:44 PM  Joseph [name] [phone]

## 2014-11-22 NOTE — Progress Notes (Signed)
PATIENT DETAILS Name: Joseph [name] Age: 59 y.o. Sex: male Date of Birth: 12/22/1955 Admit Date: 11/18/2014 Admitting Physician Theodis Blaze, MD KDT:OIZTI, REGINA, NP  Subjective: Confused-answers only a few questions appropriately  Assessment/Plan: Principal Problem: Acute encephalopathy:Initially suspected to be from toxic metabolic encephalopathy from mild DKA, C. difficile colitis. Since continues to have confusion occur in spite of improvement in blood sugars and C. difficile colitis-underwent further work up-MRI brain showed abnormal T2 signal and restricted diffusion within the medial temporal lobes and hippocampus bilaterally, EEG also positive. Have spoken with Neurology-LP planned for 8/3 as on anticoagulation, started on IV Acyclovir empirically. May need AED's started-awaiting call back from Neuro   Active Problems: C. difficile colitis: Per RN diarrhea seems to be slowing down, continue with oral vancomycin.   History of chronic atrial fibrillation: Chads2vasc score of 2, continue Cardizem, hold coumadin-give 1 dose of Vit K in anticipation of LP tomorrow-start IV Heparin in the meantime  DKA: Resolved.Transition to subcutaneous insulin. See below   Elevated LFT's:?etiology-USG abd negative, Hepatitis serology neg. Repeat in am.   Type 2 diabetes: Continues to have hypoglycemic episodes, will decrease Lantus to 20 units. Continue with SSI  End-stage renal disease: On hemodialysis-Monday, Wednesday and Friday. Nephrology following  Gout: Continue with colcichine  Chronic skin rash:Dr Candiss Norse Discussed his case with his dermatologist Dr. Allyson Sabal who describes this as nonspecific dermatitis. Supportive care only.  Enterococcal UTI: with Dilirium given1 dose of Fosfomycin on 8/2  Disposition: Remain inpatient-home when mental status improves  Antimicrobial agents  See below  Anti-infectives    Start     Dose/Rate Route Frequency Ordered Stop   11/22/14 1015  acyclovir (ZOVIRAX) 800 mg in dextrose 5 % 150 mL IVPB  Status:  Discontinued     800 mg 166 mL/hr over 60 Minutes Intravenous Every 12 hours 11/22/14 1001 11/22/14 1009   11/22/14 1015  acyclovir (ZOVIRAX) 400 mg in dextrose 5 % 100 mL IVPB     400 mg 108 mL/hr over 60 Minutes Intravenous Every 24 hours 11/22/14 1010     11/22/14 1000  acyclovir (ZOVIRAX) 200 MG capsule 200 mg  Status:  Discontinued     200 mg Oral 3 times daily 11/22/14 0826 11/22/14 1000   11/19/14 1400  vancomycin (VANCOCIN) 50 mg/mL oral solution 500 mg     500 mg Oral 4 times per day 11/19/14 1001 11/25/14 1359   11/18/14 1400  vancomycin (VANCOCIN) 125 MG capsule 250 mg  Status:  Discontinued     250 mg Oral 4 times daily 11/18/14 1128 11/18/14 1248   11/18/14 1400  vancomycin (VANCOCIN) 50 mg/mL oral solution 125 mg  Status:  Discontinued     125 mg Oral 4 times per day 11/18/14 1248 11/18/14 1320   11/18/14 1400  vancomycin (VANCOCIN) 50 mg/mL oral solution 250 mg  Status:  Discontinued     250 mg Oral 4 times per day 11/18/14 1320 11/19/14 1001      DVT Prophylaxis: Coumadin/Heparin gtt  Code Status: Full code   Family Communication Spouse over the phone on 8/3  Procedures: None  CONSULTS:  nephrology and neurology  Time spent 35 minutes-Greater than 50% of this time was spent in counseling, explanation of diagnosis, planning of further management, and coordination of care.  MEDICATIONS: Scheduled Meds: . acyclovir  400 mg Intravenous Q24H  . antiseptic oral rinse  7 mL Mouth  Rinse 6 times per day  . calcitRIOL  1.75 mcg Oral Q M,W,F-HD  . clobetasol cream  1 application Topical BID  . colchicine  0.3 mg Oral Once per day on Mon Thu  . diltiazem  30 mg Oral 4 times per day  . feeding supplement (NEPRO CARB STEADY)  237 mL Oral BID BM  . insulin aspart  0-9 Units Subcutaneous TID WC  . insulin glargine  20 Units Subcutaneous Daily  . metoprolol  25 mg Oral BID  . multivitamin   1 tablet Oral QHS  . phytonadione  5 mg Oral Once  . saccharomyces boulardii  250 mg Oral BID  . sevelamer carbonate  1,600 mg Oral TID WC  . vancomycin  500 mg Oral 4 times per day   Continuous Infusions: . sodium chloride 10 mL/hr at 11/18/14 2300  . heparin    . insulin (NOVOLIN-R) infusion Stopped (11/18/14 1600)   PRN Meds:.sodium chloride, sodium chloride, camphor-menthol, diphenhydrAMINE, feeding supplement (NEPRO CARB STEADY), heparin, heparin, heparin, lidocaine (PF), lidocaine-prilocaine, pentafluoroprop-tetrafluoroeth    PHYSICAL EXAM: Vital signs in last 24 hours: Filed Vitals:   11/21/14 2317 11/22/14 0010 11/22/14 0412 11/22/14 0802  BP: 155/88 134/90 121/82 141/80  Pulse: 90 93 104 97  Temp:  99.5 F (37.5 C) 98.6 F (37 C) 98.9 F (37.2 C)  TempSrc:  Oral Oral Oral  Resp:  18 16 18   Height:      Weight:      SpO2:  96% 96% 96%    Weight change:  Filed Weights   11/19/14 1900 11/21/14 0812 11/21/14 1229  Weight: 82.2 kg (181 lb 3.5 oz) 82.1 kg (181 lb) 79.6 kg (175 lb 7.8 oz)   Body mass index is 23.79 kg/(m^2).   Gen Exam: Awake but very confused   Neck: Supple, No JVD.   Chest: B/L Clear.   CVS: S1 S2 Regular, no murmurs.  Abdomen: soft, BS +, non tender, non distended.  Extremities: no edema, lower extremities warm to touch. Neurologic: Non Focal.  Skin:papular dry rash all over mostly on the back Wounds: N/A.    Intake/Output from previous day:  Intake/Output Summary (Last 24 hours) at 11/22/14 1050 Last data filed at 11/22/14 0606  Gross per 24 hour  Intake    800 ml  Output   1739 ml  Net   -939 ml     LAB RESULTS: CBC  Recent Labs Lab 11/19/14 0318 11/19/14 0550 11/20/14 0605 11/21/14 0830 11/22/14 0511  WBC 31.7* 27.1* 16.3* 15.6* 13.9*  HGB 13.2 12.7* 12.7* 11.9* 11.3*  HCT 39.8 38.1* 37.9* 34.3* 34.2*  PLT 253 229 193 178 142*  MCV 95.9 93.6 94.5 90.7 92.4  MCH 31.8 31.2 31.7 31.5 30.5  MCHC 33.2 33.3 33.5 34.7 33.0    RDW 17.6* 17.3* 17.7* 17.3* 17.3*    Chemistries   Recent Labs Lab 11/18/14 1958 11/19/14 0550 11/20/14 1102 11/21/14 0830 11/22/14 0511  NA 132* 127* 132* 134* 133*  K 4.8 5.9* 4.7 4.0 3.7  CL 96* 92* 97* 99* 97*  CO2 23 21* 21* 23 24  GLUCOSE 223* 263* 307* 60* 54*  BUN 83* 94* 72* 88* 46*  CREATININE 5.58* 6.88* 5.62* 5.89* 3.97*  CALCIUM 7.7* 8.2* 8.0* 8.0* 7.6*    CBG:  Recent Labs Lab 11/21/14 1314 11/21/14 1706 11/21/14 2239 11/22/14 0811 11/22/14 0848  GLUCAP 73 120* 152* 46* 74    GFR Estimated Creatinine Clearance: 22 mL/min (by C-G formula based  on Cr of 3.97).  Coagulation profile  Recent Labs Lab 11/19/14 0550 11/20/14 0605 11/21/14 0628 11/21/14 0830 11/22/14 0511  INR 6.53* 3.06* >10.00* 1.66* 1.45    Cardiac Enzymes No results for input(s): CKMB, TROPONINI, MYOGLOBIN in the last 168 hours.  Invalid input(s): CK  Invalid input(s): POCBNP No results for input(s): DDIMER in the last 72 hours. No results for input(s): HGBA1C in the last 72 hours. No results for input(s): CHOL, HDL, LDLCALC, TRIG, CHOLHDL, LDLDIRECT in the last 72 hours.  Recent Labs  11/21/14 1347  TSH 1.386    Recent Labs  11/20/14 1102 11/21/14 1347  VITAMINB12  --  1116*  FOLATE  --  24.1  FERRITIN 2764*  --    No results for input(s): LIPASE, AMYLASE in the last 72 hours.  Urine Studies No results for input(s): UHGB, CRYS in the last 72 hours.  Invalid input(s): UACOL, UAPR, USPG, UPH, UTP, UGL, UKET, UBIL, UNIT, UROB, ULEU, UEPI, UWBC, URBC, UBAC, CAST, UCOM, BILUA  MICROBIOLOGY: Recent Results (from the past 240 hour(s))  Blood culture (routine x 2)     Status: None (Preliminary result)   Collection Time: 11/18/14  6:30 AM  Result Value Ref Range Status   Specimen Description BLOOD LEFT ARM  Final   Special Requests BOTTLES DRAWN AEROBIC AND ANAEROBIC 5CC  Final   Culture NO GROWTH 3 DAYS  Final   Report Status PENDING  Incomplete  Blood  culture (routine x 2)     Status: None (Preliminary result)   Collection Time: 11/18/14  6:30 AM  Result Value Ref Range Status   Specimen Description BLOOD LEFT HAND  Final   Special Requests BOTTLES DRAWN AEROBIC AND ANAEROBIC 5CC  Final   Culture NO GROWTH 3 DAYS  Final   Report Status PENDING  Incomplete  Urine culture     Status: None   Collection Time: 11/18/14 11:30 AM  Result Value Ref Range Status   Specimen Description URINE, RANDOM  Final   Special Requests NONE  Final   Culture   Final    >=100,000 COLONIES/mL ENTEROCOCCUS SPECIES 30,000 COLONIES/mL ESCHERICHIA COLI    Report Status 11/21/2014 FINAL  Final   Organism ID, Bacteria ENTEROCOCCUS SPECIES  Final   Organism ID, Bacteria ESCHERICHIA COLI  Final      Susceptibility   Escherichia coli - MIC*    AMPICILLIN >=32 RESISTANT Resistant     CEFAZOLIN >=64 RESISTANT Resistant     CEFTRIAXONE <=1 SENSITIVE Sensitive     CIPROFLOXACIN <=0.25 SENSITIVE Sensitive     GENTAMICIN <=1 SENSITIVE Sensitive     IMIPENEM <=0.25 SENSITIVE Sensitive     NITROFURANTOIN <=16 SENSITIVE Sensitive     TRIMETH/SULFA <=20 SENSITIVE Sensitive     AMPICILLIN/SULBACTAM 8 SENSITIVE Sensitive     PIP/TAZO <=4 SENSITIVE Sensitive     * 30,000 COLONIES/mL ESCHERICHIA COLI   Enterococcus species - MIC*    AMPICILLIN <=2 SENSITIVE Sensitive     LEVOFLOXACIN 1 SENSITIVE Sensitive     NITROFURANTOIN <=16 SENSITIVE Sensitive     VANCOMYCIN 2 SENSITIVE Sensitive     LINEZOLID 1 SENSITIVE Sensitive     * >=100,000 COLONIES/mL ENTEROCOCCUS SPECIES  MRSA PCR Screening     Status: None   Collection Time: 11/18/14 11:30 AM  Result Value Ref Range Status   MRSA by PCR NEGATIVE NEGATIVE Final    Comment:        The GeneXpert MRSA Assay (FDA approved for NASAL specimens only),  is one component of a comprehensive MRSA colonization surveillance program. It is not intended to diagnose MRSA infection nor to guide or monitor treatment for MRSA  infections.   Clostridium Difficile by PCR (not at [facility])     Status: None   Collection Time: 11/19/14  3:50 PM  Result Value Ref Range Status   Toxigenic C Difficile by pcr NEGATIVE NEGATIVE Final    RADIOLOGY STUDIES/RESULTS: Dg Chest 2 View  11/18/2014   CLINICAL DATA:  Altered mental status. Shortness of breath and nausea today.  EXAM: CHEST  2 VIEW  COMPARISON:  11/05/2014  FINDINGS: Chronic cardiomegaly and vascular congestion, unchanged. Diminished right pleural effusion and basilar opacity from prior exam. No new confluent airspace disease or pneumothorax. No acute osseous abnormalities.  IMPRESSION: 1. Diminished right pleural effusion and right basilar opacity. 2. Stable chronic cardiomegaly and vascular congestion.   Electronically Signed   By: Jeb Levering M.D.   On: 11/18/2014 06:21   Dg Chest 2 View  11/05/2014   CLINICAL DATA:  Dyspnea for 1 day  EXAM: CHEST  2 VIEW  COMPARISON:  July 03, 2014  FINDINGS: The heart size and mediastinal contours are stable. There is mild patchy consolidation of lateral right lung base with small right pleural effusion. There is mild chronic central pulmonary vascular congestion. The visualized skeletal structures are stable.  IMPRESSION: Chronic central pulmonary vascular congestion.  Small right pleural effusion with associated mild patchy opacity of right lung base which could be due to atelectasis but superimposed pneumonia is not excluded.   Electronically Signed   By: Abelardo Diesel M.D.   On: 11/05/2014 21:54   Ct Head Wo Contrast  11/18/2014   CLINICAL DATA:  Altered mental status.  EXAM: CT HEAD WITHOUT CONTRAST  TECHNIQUE: Contiguous axial images were obtained from the base of the skull through the vertex without intravenous contrast.  COMPARISON:  None.  FINDINGS: No intracranial hemorrhage, mass effect, or midline shift. Small focal encephalomalacia in the left occipital lobe. No hydrocephalus. The basilar cisterns are patent. No evidence  of territorial infarct. No intracranial fluid collection. Calvarium is intact. Included paranasal sinuses and mastoid air cells are well aerated.  IMPRESSION: 1.  No acute intracranial abnormality. 2. Small focal encephalomalacia in the left occipital lobe.   Electronically Signed   By: Jeb Levering M.D.   On: 11/18/2014 06:13   Mr Brain Wo Contrast  11/21/2014   CLINICAL DATA:  Altered mental status. Encephalopathy. Atrial fibrillation.  EXAM: MRI HEAD WITHOUT CONTRAST  TECHNIQUE: Multiplanar, multiecho pulse sequences of the brain and surrounding structures were obtained without intravenous contrast.  COMPARISON:  CT head without contrast from the same day.  FINDINGS: Restricted diffusion and increased T2 signal is present within the medial temporal lobes and hippocampal structures bilaterally. Inferior temporal lobes are intact.  Moderate generalized atrophy and diffuse periventricular T2 changes bilaterally are advanced for age. The ventricles are of normal size.  Flow is present in the major intracranial arteries. The globes and orbits are intact. The paranasal sinuses are clear. There is some fluid in the right mastoid air cells. No obstructing nasopharyngeal lesion is evident.  IMPRESSION: Abnormal T2 signal and restricted diffusion within the medial temporal lobes and hippocampus bilaterally. 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 There is no hemorrhage or signal change along the undersurface the temporal lobes, but herpes encephalitis is also considered. Status epilepticus is also on the differential diagnosis.  Atrophy in T2 signal changes are advanced  for age. This is nonspecific, but likely reflects the sequela of chronic microvascular ischemia.  These results will be called to the ordering clinician or representative by the Radiologist Assistant, and communication documented in the PACS or zVision Dashboard.   Electronically Signed   By: San Morelle M.D.   On: 11/21/2014  19:01   US Abdomen Complete  11/06/2014   CLINICAL DATA:  Abnormal liver function tests  EXAM: ULTRASOUND ABDOMEN COMPLETE  COMPARISON:  None.  FINDINGS: Gallbladder: No gallstones or wall thickening visualized. No sonographic Murphy sign noted.  Common bile duct: Diameter: 4 mm  Liver: Minimally nodular contour with no focal abnormalities.  IVC: No abnormality visualized.  Pancreas: Visualized portion unremarkable.  Spleen: Size and appearance within normal limits.  Right Kidney: Length: 10.7 cm. Midpole cyst measuring 14 mm. Mildly increased echogenicity.  Left Kidney: Length: 10.8 cm. Lower pole cyst measures 1 cm. Mildly increased echogenicity.  Abdominal aorta: No aneurysm identified. Limited visualization distally due to bowel gas.  Other findings: Right pleural effusion.  Small volume ascites.  IMPRESSION: Mildly nodular liver contour with small volume of ascites and right pleural effusion. Possibility of cirrhosis not excluded.  Evidence of medical renal disease.   Electronically Signed   By: Skipper Cliche M.D.   On: 11/06/2014 21:38    Oren Binet, MD  [facility] Pager:[phone]  If 7PM-7AM, please contact night-coverage [email or web address] Password [email or web address] 11/22/2014, 10:50 AM   LOS: 4 days

## 2014-11-22 NOTE — Progress Notes (Signed)
EEG Completed; Results Pending  

## 2014-11-22 NOTE — Progress Notes (Signed)
ANTICOAGULATION  / Acyclovir CONSULT NOTE - Initial Consult  Pharmacy Consult for Coumadin to Heparin / Acyclovir Indication: Afib, ? meningitis  Allergies  Allergen Reactions  . Dilaudid [Hydromorphone] Nausea And Vomiting    Patient Measurements: Height: 6' (182.9 cm) Weight: 175 lb 7.8 oz (79.6 kg) IBW/kg (Calculated) : 77.6   Vital Signs: Temp: 98.9 F (37.2 C) (08/04 0802) Temp Source: Oral (08/04 0802) BP: 141/80 mmHg (08/04 0802) Pulse Rate: 97 (08/04 0802)  Labs:  Recent Labs  11/20/14 0605 11/20/14 1102 11/21/14 0628 11/21/14 0830 11/22/14 0511  HGB 12.7*  --   --  11.9* 11.3*  HCT 37.9*  --   --  34.3* 34.2*  PLT 193  --   --  178 142*  LABPROT 31.1*  --  >90.0* 19.6* 17.8*  INR 3.06*  --  >10.00* 1.66* 1.45  CREATININE  --  5.62*  --  5.89* 3.97*    Estimated Creatinine Clearance: 22 mL/min (by C-G formula based on Cr of 3.97).   Medical History: Past Medical History  Diagnosis Date  . Atrial fibrillation     a. 11/18/2011 s/p DCCV - 150J;  b. Anticoagulation w/ Apixaban;  c. 11/2011 back in afib->asymptomatic.  . H/O alcohol abuse     a. drinks heavily on the weekends.  . Hypertension   . Diabetes mellitus   . Heart murmur     a. 09/2011 Echo: EF 55-60%, Triv AI, Mild MR, mildly dil LA.  . Diabetic peripheral neuropathy   . CKD (chronic kidney disease), stage III   . CHF (congestive heart failure)   . Pneumonia   . Sleep apnea     Assessment: 59 year old male on Coumadin PTA for Afib now to transition to heparin in preparation for LP.  Also to start IV acyclovir for rule out meningitis.  With ESRD  Goal of Therapy:  Heparin level 0.3-0.7 units/ml Monitor platelets by anticoagulation protocol: Yes   Plan:  Heparin drip at 1150 units / hr Heparin level 8 hours after heparin starts Daily heparin level, CBC  Thank you. Anette Guarneri, PharmD [phone]  11/22/2014,10:02 AM

## 2014-11-23 DIAGNOSIS — G9340 Encephalopathy, unspecified: Secondary | ICD-10-CM | POA: Insufficient documentation

## 2014-11-23 DIAGNOSIS — I481 Persistent atrial fibrillation: Secondary | ICD-10-CM

## 2014-11-23 LAB — CULTURE, BLOOD (ROUTINE X 2)
CULTURE: NO GROWTH
Culture: NO GROWTH

## 2014-11-23 LAB — CSF CELL COUNT WITH DIFFERENTIAL
RBC COUNT CSF: 38 /mm3 — AB
RBC Count, CSF: 0 /mm3
Tube #: 1
Tube #: 4
WBC CSF: 1 /mm3 (ref 0–5)
WBC, CSF: 1 /mm3 (ref 0–5)

## 2014-11-23 LAB — CBC
HEMATOCRIT: 36.4 % — AB (ref 39.0–52.0)
Hemoglobin: 12.3 g/dL — ABNORMAL LOW (ref 13.0–17.0)
MCH: 31.6 pg (ref 26.0–34.0)
MCHC: 33.8 g/dL (ref 30.0–36.0)
MCV: 93.6 fL (ref 78.0–100.0)
Platelets: 139 10*3/uL — ABNORMAL LOW (ref 150–400)
RBC: 3.89 MIL/uL — ABNORMAL LOW (ref 4.22–5.81)
RDW: 17.5 % — AB (ref 11.5–15.5)
WBC: 12 10*3/uL — ABNORMAL HIGH (ref 4.0–10.5)

## 2014-11-23 LAB — HEPARIN LEVEL (UNFRACTIONATED): HEPARIN UNFRACTIONATED: 0.52 [IU]/mL (ref 0.30–0.70)

## 2014-11-23 LAB — RENAL FUNCTION PANEL
ALBUMIN: 3.2 g/dL — AB (ref 3.5–5.0)
Anion gap: 15 (ref 5–15)
BUN: 57 mg/dL — ABNORMAL HIGH (ref 6–20)
CALCIUM: 7.9 mg/dL — AB (ref 8.9–10.3)
CO2: 24 mmol/L (ref 22–32)
Chloride: 96 mmol/L — ABNORMAL LOW (ref 101–111)
Creatinine, Ser: 4.68 mg/dL — ABNORMAL HIGH (ref 0.61–1.24)
GFR calc Af Amer: 14 mL/min — ABNORMAL LOW (ref >60)
GFR calc non Af Amer: 12 mL/min — ABNORMAL LOW (ref >60)
GLUCOSE: 52 mg/dL — AB (ref 65–99)
PHOSPHORUS: 4.5 mg/dL (ref 2.5–4.6)
POTASSIUM: 4.1 mmol/L (ref 3.5–5.1)
SODIUM: 135 mmol/L (ref 135–145)

## 2014-11-23 LAB — GLUCOSE, CAPILLARY
GLUCOSE-CAPILLARY: 74 mg/dL (ref 65–99)
GLUCOSE-CAPILLARY: 109 mg/dL — AB (ref 65–99)
Glucose-Capillary: 124 mg/dL — ABNORMAL HIGH (ref 65–99)

## 2014-11-23 LAB — HEPATIC FUNCTION PANEL
ALT: 169 U/L — AB (ref 17–63)
AST: 127 U/L — AB (ref 15–41)
Albumin: 3.2 g/dL — ABNORMAL LOW (ref 3.5–5.0)
Alkaline Phosphatase: 209 U/L — ABNORMAL HIGH (ref 38–126)
Bilirubin, Direct: 0.7 mg/dL — ABNORMAL HIGH (ref 0.1–0.5)
Indirect Bilirubin: 0.8 mg/dL (ref 0.3–0.9)
Total Bilirubin: 1.5 mg/dL — ABNORMAL HIGH (ref 0.3–1.2)
Total Protein: 6.3 g/dL — ABNORMAL LOW (ref 6.5–8.1)

## 2014-11-23 LAB — PROTEIN AND GLUCOSE, CSF
Glucose, CSF: 42 mg/dL (ref 40–70)
TOTAL PROTEIN, CSF: 74 mg/dL — AB (ref 15–45)

## 2014-11-23 LAB — PROTIME-INR
INR: 1.31 (ref 0.00–1.49)
Prothrombin Time: 16.4 seconds — ABNORMAL HIGH (ref 11.6–15.2)

## 2014-11-23 LAB — TROPONIN I
TROPONIN I: 0.53 ng/mL — AB (ref <0.031)
Troponin I: 0.52 ng/mL (ref <0.031)

## 2014-11-23 LAB — PHENYTOIN LEVEL, TOTAL: Phenytoin Lvl: 12.5 ug/mL (ref 10.0–20.0)

## 2014-11-23 MED ORDER — METOPROLOL TARTRATE 1 MG/ML IV SOLN
5.0000 mg | INTRAVENOUS | Status: DC | PRN
  Filled 2014-11-23: qty 5

## 2014-11-23 MED ORDER — LORAZEPAM 2 MG/ML IJ SOLN
1.0000 mg | Freq: Once | INTRAMUSCULAR | Status: DC
  Filled 2014-11-23 (×2): qty 1

## 2014-11-23 MED ORDER — METOPROLOL TARTRATE 1 MG/ML IV SOLN
2.5000 mg | INTRAVENOUS | Status: DC | PRN
  Administered 2014-11-23 – 2014-11-24 (×2): 2.5 mg via INTRAVENOUS
  Filled 2014-11-23: qty 5

## 2014-11-23 MED ORDER — METOPROLOL TARTRATE 100 MG PO TABS: 100.0000 mg | ORAL_TABLET | Freq: Every day | ORAL | Status: DC

## 2014-11-23 MED ORDER — HEPARIN SODIUM (PORCINE) 1000 UNIT/ML DIALYSIS: 20.0000 [IU]/kg | INTRAMUSCULAR | Status: DC | PRN

## 2014-11-23 MED ORDER — DIPHENHYDRAMINE HCL 50 MG/ML IJ SOLN
INTRAMUSCULAR | Status: AC
  Filled 2014-11-23: qty 1

## 2014-11-23 MED ORDER — HEPARIN (PORCINE) IN NACL 100-0.45 UNIT/ML-% IJ SOLN
1350.0000 [IU]/h | INTRAMUSCULAR | Status: DC
  Administered 2014-11-23: 1250 [IU]/h via INTRAVENOUS
  Administered 2014-11-24: 1400 [IU]/h via INTRAVENOUS
  Filled 2014-11-23 (×3): qty 250

## 2014-11-23 MED ORDER — METOPROLOL TARTRATE 50 MG PO TABS
50.0000 mg | ORAL_TABLET | Freq: Two times a day (BID) | ORAL | Status: DC
  Administered 2014-11-24 – 2014-11-30 (×11): 50 mg via ORAL
  Filled 2014-11-23 (×12): qty 1

## 2014-11-23 NOTE — Procedures (Signed)
LP Procedure Note:  Patient has been seen and examined.  Chart has been reviewed.  LP is being performed to evaluate for encephalitis.  Procedure has been explained to patient/family including risks and benefits.  Consent has been signed by patient/family and witnessed.   Blood pressure 143/72, pulse 78, temperature 98.1 F (36.7 C), temperature source Oral, resp. rate 20, height 6' (1.829 m), weight 73.4 kg (161 lb 13.1 oz), SpO2 100 %.   Current facility-administered medications:  .  0.9 %  sodium chloride infusion, , Intravenous, Continuous, Melton Alar, PA-C, Last Rate: 10 mL/hr at 11/18/14 2300 .  0.9 %  sodium chloride infusion, 100 mL, Intravenous, PRN, Alric Seton, PA-C .  0.9 %  sodium chloride infusion, 100 mL, Intravenous, PRN, Alric Seton, PA-C .  acyclovir (ZOVIRAX) 400 mg in dextrose 5 % 100 mL IVPB, 400 mg, Intravenous, Q24H, Jonetta Osgood, MD, 400 mg at 11/23/14 1329 .  antiseptic oral rinse (CPC / CETYLPYRIDINIUM CHLORIDE 0.05%) solution 7 mL, 7 mL, Mouth Rinse, 6 times per day, Theodis Blaze, MD, 7 mL at 11/23/14 1341 .  calcitRIOL (ROCALTROL) capsule 1.75 mcg, 1.75 mcg, Oral, Q M,W,F-HD, Donato Heinz, MD, 1.75 mcg at 11/21/14 1420 .  camphor-menthol (SARNA) lotion, , Topical, PRN, Gardiner Barefoot, NP, 1 application at 18/84/16 0534 .  clobetasol cream (TEMOVATE) 6.06 % 1 application, 1 application, Topical, BID, Melton Alar, PA-C, 1 application at 30/16/01 1331 .  colchicine tablet 0.3 mg, 0.3 mg, Oral, Once per day on Mon Thu, Prashant K Singh, MD, 0.3 mg at 11/22/14 0900 .  diltiazem (CARDIZEM) tablet 30 mg, 30 mg, Oral, 4 times per day, Alric Seton, PA-C, 30 mg at 11/23/14 1329 .  diphenhydrAMINE (BENADRYL) injection 12.5 mg, 12.5 mg, Intravenous, Q8H PRN, Thurnell Lose, MD, 12.5 mg at 11/23/14 1008 .  feeding supplement (NEPRO CARB STEADY) liquid 237 mL, 237 mL, Oral, BID BM, Marianne L York, PA-C, 237 mL at 11/23/14 1331 .  feeding  supplement (NEPRO CARB STEADY) liquid 237 mL, 237 mL, Oral, PRN, Alric Seton, PA-C .  heparin injection 1,000 Units, 1,000 Units, Dialysis, PRN, Alric Seton, PA-C .  heparin injection 1,600 Units, 20 Units/kg, Dialysis, PRN, Alric Seton, PA-C .  insulin aspart (novoLOG) injection 0-9 Units, 0-9 Units, Subcutaneous, TID WC, Melton Alar, PA-C, 1 Units at 11/22/14 1100 .  insulin glargine (LANTUS) injection 20 Units, 20 Units, Subcutaneous, Daily, Jonetta Osgood, MD, 20 Units at 11/22/14 1100 .  insulin regular (NOVOLIN R,HUMULIN R) 250 Units in sodium chloride 0.9 % 250 mL (1 Units/mL) infusion, , Intravenous, Continuous, Jola Schmidt, MD, Stopped at 11/18/14 1600 .  lidocaine (PF) (XYLOCAINE) 1 % injection 5 mL, 5 mL, Intradermal, PRN, Alric Seton, PA-C .  lidocaine-prilocaine (EMLA) cream 1 application, 1 application, Topical, PRN, Alric Seton, PA-C .  LORazepam (ATIVAN) injection 1 mg, 1 mg, Intravenous, Once, Marliss Coots, PA-C .  metoprolol (LOPRESSOR) injection 2.5-5 mg, 2.5-5 mg, Intravenous, Q4H PRN, Jonetta Osgood, MD .  metoprolol (LOPRESSOR) tablet 50 mg, 50 mg, Oral, BID, Jonetta Osgood, MD .  multivitamin (RENA-VIT) tablet 1 tablet, 1 tablet, Oral, QHS, Melton Alar, PA-C, 1 tablet at 11/22/14 2113 .  pentafluoroprop-tetrafluoroeth (GEBAUERS) aerosol 1 application, 1 application, Topical, PRN, Alric Seton, PA-C .  phenytoin (DILANTIN) injection 100 mg, 100 mg, Intravenous, 3 times per day, Jonetta Osgood, MD, 100 mg at 11/23/14 1330 .  saccharomyces boulardii (FLORASTOR) capsule 250 mg, 250 mg,  Oral, BID, Melton Alar, PA-C, 250 mg at 11/23/14 1327 .  sevelamer carbonate (RENVELA) tablet 1,600 mg, 1,600 mg, Oral, TID WC, Donato Heinz, MD, 1,600 mg at 11/23/14 1328 .  vancomycin (VANCOCIN) 50 mg/mL oral solution 500 mg, 500 mg, Oral, 4 times per day, Thurnell Lose, MD, 500 mg at 11/23/14 1330   Recent Labs  11/22/14 0511 11/23/14 0532   WBC 13.9* 12.0*  HGB 11.3* 12.3*  HCT 34.2* 36.4*  PLT 142* 139*  INR 1.45 1.31    CT of head: was WNL--IMPRESSION: 1. No acute intracranial abnormality. 2. Small focal encephalomalacia in the left occipital lobe.  Patient was placed in the lateral decub/sitting position.  Area was cleaned with betadine and anesthetized with lidocaine.  Under sterile conditions 20G LP needle was placed at approximately L3-4 without difficulty. .  Approximately 10cc of clear CSF fluid was obtained and sent for studies.  No complications were noted.    Etta Quill PA-C [facility] [phone]  11/23/2014, 2:36 PM   Jim Like, DO [facility] [phone]  If 7pm- 7am, please page neurology on call as listed in [place].

## 2014-11-23 NOTE — Progress Notes (Signed)
ANTICOAGULATION CONSULT NOTE - Follow Up Consult  Pharmacy Consult for Heparin (warfarin on hold) Indication: atrial fibrillation  Allergies  Allergen Reactions  . Dilaudid [Hydromorphone] Nausea And Vomiting    Patient Measurements: Height: 6' (182.9 cm) Weight: 175 lb 7.8 oz (79.6 kg) IBW/kg (Calculated) : 77.6  Vital Signs: Temp: 98.7 F (37.1 C) (08/05 0023) Temp Source: Oral (08/05 0023) BP: 140/92 mmHg (08/05 0023) Pulse Rate: 84 (08/05 0023)  Labs:  Recent Labs  11/20/14 0605 11/20/14 1102 11/21/14 0628 11/21/14 0830 11/22/14 0511 11/22/14 1932 11/23/14  HGB 12.7*  --   --  11.9* 11.3*  --   --   HCT 37.9*  --   --  34.3* 34.2*  --   --   PLT 193  --   --  178 142*  --   --   LABPROT 31.1*  --  >90.0* 19.6* 17.8*  --   --   INR 3.06*  --  >10.00* 1.66* 1.45  --   --   HEPARINUNFRC  --   --   --   --   --  2.10* 0.52  CREATININE  --  5.62*  --  5.89* 3.97*  --   --     Estimated Creatinine Clearance: 22 mL/min (by C-G formula based on Cr of 3.97).   Assessment: Heparin level therapeutic at 0.52, heparin OFF this AM at 0700 for LP  Goal of Therapy:  Heparin level 0.3-0.7 units/ml Monitor platelets by anticoagulation protocol: Yes   Plan:  -Continue heparin at 1150 units/hr -F/U anti-coag plans after LP  Narda Bonds 11/23/2014,12:27 AM

## 2014-11-23 NOTE — Care Management Note (Signed)
Case Management Note  Patient Details  Name: Joseph [name] MRN: [id number] Date of Birth: March 27, 1956  Subjective/Objective:     NCM spoke with spouse, [name],  she states that patient pta was getting around pretty well.  Wife states they would need HHRN, HHPT and aide, NCM informed her with [facility] not sure they would get aide but we can try. Patient goes to [facility] [place] to get his medication.  Patient has transportation at dc.  NCM made referral to [facility] with [facility] Go for [facility], [place] and aide, waiting to hear back.                 Action/Plan:   Expected Discharge Date:                  Expected Discharge Plan:  [place]  In-House Referral:     Discharge planning Services  CM Consult  Post Acute Care Choice:  Home Health Choice offered to:  Spouse  DME Arranged:    DME Agency:     HH Arranged:  PT, RN, Nurse's Aide HH Agency:  [facility]  Status of Service:  In process, will continue to follow  Medicare Important Message Given:    Date Medicare IM Given:    Medicare IM give by:    Date Additional Medicare IM Given:    Additional Medicare Important Message give by:     If discussed at [place] of Stay Meetings, dates discussed:    Additional Comments:  [name], RN 11/23/2014, 3:53 PM

## 2014-11-23 NOTE — Progress Notes (Signed)
Inpatient Diabetes Program Recommendations  AACE/ADA: New Consensus Statement on Inpatient Glycemic Control (2013)  Target Ranges:  Prepandial:   less than 140 mg/dL      Peak postprandial:   less than 180 mg/dL (1-2 hours)      Critically ill patients:  140 - 180 mg/dL   Recommend reducing Lantus to 10 units. Thank you  Raoul Pitch BSN, RN,CDE Inpatient Diabetes Coordinator [phone] (team pager)

## 2014-11-23 NOTE — Consult Note (Signed)
Cardiologist:  Caryl Comes Reason for Consult: Atrial Fibrillation Referring Physician:   [name] is an 59 y.o. male.  HPI:   The patient is a 59 yo male chronic atrial fibrillation since June 2013, ESRD, alcohol abuse, hypertension, diabetes mellitus, diabetic peripheral neuropathy, chronic kidney disease, CHF, sleep apnea.  Patient was admitted on 7/31 with C. difficile colitis, DKA, acute encephalopathy.  He underwent LP procedure today after Coumadin was discontinued and he was placed on heparin.  INR on 8/3 was >10.0.  MRI head: Abnormal T2 signal and restricted diffusion within the medial temporal lobes and hippocampus bilaterally. He currently denies The patient currently denies nausea, vomiting, fever, chest pain, shortness of breath, orthopnea, dizziness, PND, cough, congestion, abdominal pain, hematochezia, melena, lower extremity edema.    Past Medical History  Diagnosis Date  . Atrial fibrillation     a. 11/18/2011 s/p DCCV - 150J;  b. Anticoagulation w/ Apixaban;  c. 11/2011 back in afib->asymptomatic.  . H/O alcohol abuse     a. drinks heavily on the weekends.  . Hypertension   . Diabetes mellitus   . Heart murmur     a. 09/2011 Echo: EF 55-60%, Triv AI, Mild MR, mildly dil LA.  . Diabetic peripheral neuropathy   . CKD (chronic kidney disease), stage III   . CHF (congestive heart failure)   . Pneumonia   . Sleep apnea     Past Surgical History  Procedure Laterality Date  . Eye sx  11/11/2011    left eye  . Cardioversion  11/18/2011    Procedure: CARDIOVERSION;  Surgeon: Josue Hector, MD;  Location: [facility] ENDOSCOPY;  Service: Cardiovascular;  Laterality: N/A;  . Colonoscopy w/ biopsies and polypectomy    . Av fistula placement Left 02/13/2014    Procedure: INSERTION OF ARTERIOVENOUS (AV) GORE-TEX GRAFT ARM;  Surgeon: Angelia Mould, MD;  Location: [place];  Service: Vascular;  Laterality: Left;  . Right heart catheterization N/A 10/11/2013    Procedure: RIGHT HEART  CATH;  Surgeon: Larey Dresser, MD;  Location[facility] CATH LAB;  Service: Cardiovascular;  Laterality: N/A;    Family History  Problem Relation Age of Onset  . Heart disease Mother     before age 21  . Diabetes Father   . Diabetes Sister   . Peripheral vascular disease Sister     amputation  . Cancer Neg Hx   . Stroke Neg Hx     Social History:  reports that he quit smoking about 20 years ago. His smoking use included Cigarettes. He has a .05 pack-year smoking history. He has never used smokeless tobacco. He reports that he does not drink alcohol or use illicit drugs.  Allergies:  Allergies  Allergen Reactions  . Dilaudid [Hydromorphone] Nausea And Vomiting    Medications:  Scheduled Meds: . acyclovir  400 mg Intravenous Q24H  . antiseptic oral rinse  7 mL Mouth Rinse 6 times per day  . calcitRIOL  1.75 mcg Oral Q M,W,F-HD  . clobetasol cream  1 application Topical BID  . colchicine  0.3 mg Oral Once per day on Mon Thu  . diltiazem  30 mg Oral 4 times per day  . feeding supplement (NEPRO CARB STEADY)  237 mL Oral BID BM  . insulin aspart  0-9 Units Subcutaneous TID WC  . insulin glargine  20 Units Subcutaneous Daily  . LORazepam  1 mg Intravenous Once  . metoprolol  50 mg Oral BID  . multivitamin  1 tablet Oral QHS  . phenytoin (DILANTIN) IV  100 mg Intravenous 3 times per day  . saccharomyces boulardii  250 mg Oral BID  . sevelamer carbonate  1,600 mg Oral TID WC  . vancomycin  500 mg Oral 4 times per day   Continuous Infusions: . sodium chloride 10 mL/hr at 11/18/14 2300  . heparin    . insulin (NOVOLIN-R) infusion Stopped (11/18/14 1600)   PRN Meds:.sodium chloride, sodium chloride, camphor-menthol, diphenhydrAMINE, feeding supplement (NEPRO CARB STEADY), heparin, heparin, lidocaine (PF), lidocaine-prilocaine, metoprolol, pentafluoroprop-tetrafluoroeth   Results for orders placed or performed during the hospital encounter of 11/18/14 (from the past 48 hour(s))    Glucose, capillary     Status: Abnormal   Collection Time: 11/21/14  5:06 PM  Result Value Ref Range   Glucose-Capillary 120 (H) 65 - 99 mg/dL  Glucose, capillary     Status: Abnormal   Collection Time: 11/21/14 10:39 PM  Result Value Ref Range   Glucose-Capillary 152 (H) 65 - 99 mg/dL   Comment 1 Notify RN    Comment 2 Document in Chart   Protime-INR     Status: Abnormal   Collection Time: 11/22/14  5:11 AM  Result Value Ref Range   Prothrombin Time 17.8 (H) 11.6 - 15.2 seconds   INR 1.45 0.00 - 1.49  CBC     Status: Abnormal   Collection Time: 11/22/14  5:11 AM  Result Value Ref Range   WBC 13.9 (H) 4.0 - 10.5 K/uL   RBC 3.70 (L) 4.22 - 5.81 MIL/uL   Hemoglobin 11.3 (L) 13.0 - 17.0 g/dL   HCT 34.2 (L) 39.0 - 52.0 %   MCV 92.4 78.0 - 100.0 fL   MCH 30.5 26.0 - 34.0 pg   MCHC 33.0 30.0 - 36.0 g/dL   RDW 17.3 (H) 11.5 - 15.5 %   Platelets 142 (L) 150 - 400 K/uL  Renal function panel     Status: Abnormal   Collection Time: 11/22/14  5:11 AM  Result Value Ref Range   Sodium 133 (L) 135 - 145 mmol/L   Potassium 3.7 3.5 - 5.1 mmol/L   Chloride 97 (L) 101 - 111 mmol/L   CO2 24 22 - 32 mmol/L   Glucose, Bld 54 (L) 65 - 99 mg/dL   BUN 46 (H) 6 - 20 mg/dL   Creatinine, Ser 3.97 (H) 0.61 - 1.24 mg/dL   Calcium 7.6 (L) 8.9 - 10.3 mg/dL   Phosphorus 4.1 2.5 - 4.6 mg/dL   Albumin 2.9 (L) 3.5 - 5.0 g/dL   GFR calc non Af Amer 15 (L) >60 mL/min   GFR calc Af Amer 18 (L) >60 mL/min    Comment: (NOTE) The eGFR has been calculated using the CKD EPI equation. This calculation has not been validated in all clinical situations. eGFR's persistently <60 mL/min signify possible Chronic Kidney Disease.    Anion gap 12 5 - 15  Glucose, capillary     Status: Abnormal   Collection Time: 11/22/14  8:11 AM  Result Value Ref Range   Glucose-Capillary 46 (L) 65 - 99 mg/dL  Glucose, capillary     Status: None   Collection Time: 11/22/14  8:48 AM  Result Value Ref Range   Glucose-Capillary 74  65 - 99 mg/dL  Glucose, capillary     Status: Abnormal   Collection Time: 11/22/14 11:50 AM  Result Value Ref Range   Glucose-Capillary 127 (H) 65 - 99 mg/dL  Glucose, capillary  Status: Abnormal   Collection Time: 11/22/14  4:54 PM  Result Value Ref Range   Glucose-Capillary 109 (H) 65 - 99 mg/dL  Heparin level (unfractionated)     Status: Abnormal   Collection Time: 11/22/14  7:32 PM  Result Value Ref Range   Heparin Unfractionated 2.10 (H) 0.30 - 0.70 IU/mL    Comment: REPEATED TO VERIFY RESULTS CONFIRMED BY MANUAL DILUTION        IF HEPARIN RESULTS ARE BELOW EXPECTED VALUES, AND PATIENT DOSAGE HAS BEEN CONFIRMED, SUGGEST FOLLOW UP TESTING OF ANTITHROMBIN III LEVELS.   Glucose, capillary     Status: Abnormal   Collection Time: 11/22/14  9:40 PM  Result Value Ref Range   Glucose-Capillary 143 (H) 65 - 99 mg/dL   Comment 1 Notify RN    Comment 2 Document in Chart   Heparin level (unfractionated)     Status: None   Collection Time: 11/23/14 12:00 AM  Result Value Ref Range   Heparin Unfractionated 0.52 0.30 - 0.70 IU/mL    Comment:        IF HEPARIN RESULTS ARE BELOW EXPECTED VALUES, AND PATIENT DOSAGE HAS BEEN CONFIRMED, SUGGEST FOLLOW UP TESTING OF ANTITHROMBIN III LEVELS.   Protime-INR     Status: Abnormal   Collection Time: 11/23/14  5:32 AM  Result Value Ref Range   Prothrombin Time 16.4 (H) 11.6 - 15.2 seconds   INR 1.31 0.00 - 1.49  CBC     Status: Abnormal   Collection Time: 11/23/14  5:32 AM  Result Value Ref Range   WBC 12.0 (H) 4.0 - 10.5 K/uL   RBC 3.89 (L) 4.22 - 5.81 MIL/uL   Hemoglobin 12.3 (L) 13.0 - 17.0 g/dL   HCT 36.4 (L) 39.0 - 52.0 %   MCV 93.6 78.0 - 100.0 fL   MCH 31.6 26.0 - 34.0 pg   MCHC 33.8 30.0 - 36.0 g/dL   RDW 17.5 (H) 11.5 - 15.5 %   Platelets 139 (L) 150 - 400 K/uL  Renal function panel     Status: Abnormal   Collection Time: 11/23/14  5:32 AM  Result Value Ref Range   Sodium 135 135 - 145 mmol/L   Potassium 4.1 3.5 - 5.1  mmol/L   Chloride 96 (L) 101 - 111 mmol/L   CO2 24 22 - 32 mmol/L   Glucose, Bld 52 (L) 65 - 99 mg/dL   BUN 57 (H) 6 - 20 mg/dL   Creatinine, Ser 4.68 (H) 0.61 - 1.24 mg/dL   Calcium 7.9 (L) 8.9 - 10.3 mg/dL   Phosphorus 4.5 2.5 - 4.6 mg/dL   Albumin 3.2 (L) 3.5 - 5.0 g/dL   GFR calc non Af Amer 12 (L) >60 mL/min   GFR calc Af Amer 14 (L) >60 mL/min    Comment: (NOTE) The eGFR has been calculated using the CKD EPI equation. This calculation has not been validated in all clinical situations. eGFR's persistently <60 mL/min signify possible Chronic Kidney Disease.    Anion gap 15 5 - 15  Hepatic function panel     Status: Abnormal   Collection Time: 11/23/14  5:32 AM  Result Value Ref Range   Total Protein 6.3 (L) 6.5 - 8.1 g/dL   Albumin 3.2 (L) 3.5 - 5.0 g/dL   AST 127 (H) 15 - 41 U/L   ALT 169 (H) 17 - 63 U/L   Alkaline Phosphatase 209 (H) 38 - 126 U/L   Total Bilirubin 1.5 (H) 0.3 - 1.2  mg/dL   Bilirubin, Direct 0.7 (H) 0.1 - 0.5 mg/dL   Indirect Bilirubin 0.8 0.3 - 0.9 mg/dL  Phenytoin level, total     Status: None   Collection Time: 11/23/14  5:32 AM  Result Value Ref Range   Phenytoin Lvl 12.5 10.0 - 20.0 ug/mL  Glucose, capillary     Status: None   Collection Time: 11/23/14 12:41 PM  Result Value Ref Range   Glucose-Capillary 74 65 - 99 mg/dL    Mr Brain Wo Contrast  11/21/2014   CLINICAL DATA:  Altered mental status. Encephalopathy. Atrial fibrillation.  EXAM: MRI HEAD WITHOUT CONTRAST  TECHNIQUE: Multiplanar, multiecho pulse sequences of the brain and surrounding structures were obtained without intravenous contrast.  COMPARISON:  CT head without contrast from the same day.  FINDINGS: Restricted diffusion and increased T2 signal is present within the medial temporal lobes and hippocampal structures bilaterally. Inferior temporal lobes are intact.  Moderate generalized atrophy and diffuse periventricular T2 changes bilaterally are advanced for age. The ventricles are of  normal size.  Flow is present in the major intracranial arteries. The globes and orbits are intact. The paranasal sinuses are clear. There is some fluid in the right mastoid air cells. No obstructing nasopharyngeal lesion is evident.  IMPRESSION: Abnormal T2 signal and restricted diffusion within the medial temporal lobes and hippocampus bilaterally. 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 There is no hemorrhage or signal change along the undersurface the temporal lobes, but herpes encephalitis is also considered. Status epilepticus is also on the differential diagnosis.  Atrophy in T2 signal changes are advanced for age. This is nonspecific, but likely reflects the sequela of chronic microvascular ischemia.  These results will be called to the ordering clinician or representative by the Radiologist Assistant, and communication documented in the PACS or zVision Dashboard.   Electronically Signed   By: San Morelle M.D.   On: 11/21/2014 19:01    Review of Systems  Constitutional: Negative for fever.  HENT: Negative for congestion.   Respiratory: Negative for cough and shortness of breath.   Cardiovascular: Negative for chest pain, orthopnea, leg swelling and PND.  Gastrointestinal: Positive for nausea (Last weekend).  Musculoskeletal: Negative for myalgias.  Neurological: Negative for weakness.       Confusion  All other systems reviewed and are negative.  Blood pressure 109/57, pulse 109, temperature 98.1 F (36.7 C), temperature source Oral, resp. rate 20, height 6' (1.829 m), weight 161 lb 13.1 oz (73.4 kg), SpO2 100 %. Physical Exam  Nursing note and vitals reviewed. Constitutional: He appears well-developed and well-nourished. No distress.  HENT:  Head: Normocephalic and atraumatic.  Eyes: EOM are normal. Pupils are equal, round, and reactive to light. No scleral icterus.  Neck: Normal range of motion. Neck supple.  Cardiovascular: Normal rate, S1 normal and S2  normal.  An irregularly irregular rhythm present.  No murmur heard. Pulses:      Radial pulses are 2+ on the right side, and 2+ on the left side.  No Carotid bruit  Respiratory: Effort normal and breath sounds normal. He has no wheezes. He has no rales.  GI: Soft. Bowel sounds are normal. There is no tenderness. There is no rebound.  He has a soft mass superficially on the right side of his abd.   It is not tender.  The patient reports it is where he gives himself insulin.  Musculoskeletal: He exhibits no edema.  Lymphadenopathy:    He has no cervical adenopathy.  Neurological: He is alert. He exhibits normal muscle tone.  Not oriented  Skin: Skin is warm and dry.  Psychiatric: He has a normal mood and affect.    Assessment/Plan: Principal Problem:   Delirium Active Problems:   Atrial fibrillation-permanent   Obstructive sleep apnea   Pulmonary hypertension   Rash   Volume overload   ESRD on hemodialysis   Supratherapeutic INR   C. difficile diarrhea   DKA, type 2   Hyperkalemia   Encephalopathy  The patient's afib is currently controlled around 100.  Rate was a little elevated earlier but may have been around the time of the LP.  Continue metoprolol and dilt. We can titrate as needed and as BP will allow.  Resume anticoagulation when low risk of bleeding from LP.   TWI are old.   Tarri Fuller, [place] 11/23/2014, 4:06 PM   I have personally seen and examined this patient. I agree with the assessment and plan as outlined above. He has chronic persistent atrial fib. Rate is controlled with current therapy. Can titrate metoprolol or Cardizem as needed for better rate control. Resume anti-coagulation when safe from a bleeding standpoint post LP. We will follow along.   MCALHANY,CHRISTOPHER 11/23/2014 4:57 PM

## 2014-11-23 NOTE — Progress Notes (Signed)
ANTICOAGULATION CONSULT NOTE - Follow Up Consult  Pharmacy Consult for Heparin (warfarin on hold) Indication: atrial fibrillation  Allergies  Allergen Reactions  . Dilaudid [Hydromorphone] Nausea And Vomiting    Patient Measurements: Height: 6' (182.9 cm) Weight: 161 lb 13.1 oz (73.4 kg) (stood to scale ) IBW/kg (Calculated) : 77.6  Vital Signs: Temp: 98.1 F (36.7 C) (08/05 1204) Temp Source: Oral (08/05 1204) BP: 109/57 mmHg (08/05 1430) Pulse Rate: 109 (08/05 1430)  Labs:  Recent Labs  11/21/14 0830 11/22/14 0511 11/22/14 1932 11/23/14 11/23/14 0532  HGB 11.9* 11.3*  --   --  12.3*  HCT 34.3* 34.2*  --   --  36.4*  PLT 178 142*  --   --  139*  LABPROT 19.6* 17.8*  --   --  16.4*  INR 1.66* 1.45  --   --  1.31  HEPARINUNFRC  --   --  2.10* 0.52  --   CREATININE 5.89* 3.97*  --   --  4.68*    Estimated Creatinine Clearance: 17.6 mL/min (by C-G formula based on Cr of 4.68).  Assessment: 59 year old male to resume heparin after LP To restart at 1800 pm   Goal of Therapy:  Heparin level 0.3-0.7 units/ml Monitor platelets by anticoagulation protocol: Yes   Plan:  Heparin to restart at 1800 pm at 1250 units / hr Daily heparin level, CBC  Thank you Anette Guarneri, PharmD [phone]  11/23/2014,3:24 PM

## 2014-11-23 NOTE — Progress Notes (Signed)
PATIENT DETAILS Name: Joseph [name] Age: 59 y.o. Sex: male Date of Birth: 02/21/1956 Admit Date: 11/18/2014 Admitting Physician Theodis Blaze, MD TKW:IOXBD, REGINA, NP  Subjective: Confused-answers only a few questions appropriately  Assessment/Plan: Principal Problem: Acute encephalopathy:Initially suspected to be from toxic metabolic encephalopathy from mild DKA, C. difficile colitis. Since continues to have confusion occur in spite of improvement in blood sugars and C. difficile colitis-underwent further work up-MRI brain showed abnormal T2 signal and restricted diffusion within the medial temporal lobes and hippocampus bilaterally, EEG also positive. Neurology planning on LP planned today. Started on empiric Acyclovir pending LP results, also started on Dilantin. Unfortunately remains confused, await LP results and further recommendations from Neuro  Active Problems: C. difficile colitis: Per RN diarrhea seems to be slowing down, continue with oral vancomycin.   History of chronic atrial fibrillation: Chads2vasc score of 2, continue Cardizem, coumadin/Heparin on pending LP. Follow  DKA: Resolved.Transition to subcutaneous insulin. See below   Elevated LFT's:?etiology-USG abd negative, Hepatitis serology neg.Follow-seems to have downtrended  Type 2 diabetes: Complicated by development of hypoglycemic episodes, CBG's now stable with Lantus 20 units. Continue with SSI. Follow closely  End-stage renal disease: On hemodialysis-Monday, Wednesday and Friday. Nephrology following  Gout: Continue with colcichine  Chronic skin rash:Dr Candiss Norse Discussed his case with his dermatologist Dr. Allyson Sabal who describes this as nonspecific dermatitis. Supportive care only.  Enterococcal UTI: with Dilirium given1 dose of Fosfomycin on 8/2  Disposition: Remain inpatient-home when mental status improves  Antimicrobial agents  See below  Anti-infectives    Start     Dose/Rate Route  Frequency Ordered Stop   11/22/14 1015  acyclovir (ZOVIRAX) 800 mg in dextrose 5 % 150 mL IVPB  Status:  Discontinued     800 mg 166 mL/hr over 60 Minutes Intravenous Every 12 hours 11/22/14 1001 11/22/14 1009   11/22/14 1015  acyclovir (ZOVIRAX) 400 mg in dextrose 5 % 100 mL IVPB     400 mg 108 mL/hr over 60 Minutes Intravenous Every 24 hours 11/22/14 1010     11/22/14 1000  acyclovir (ZOVIRAX) 200 MG capsule 200 mg  Status:  Discontinued     200 mg Oral 3 times daily 11/22/14 0826 11/22/14 1000   11/19/14 1400  vancomycin (VANCOCIN) 50 mg/mL oral solution 500 mg     500 mg Oral 4 times per [name] 11/19/14 1001 11/25/14 1359   11/18/14 1400  vancomycin (VANCOCIN) 125 MG capsule 250 mg  Status:  Discontinued     250 mg Oral 4 times daily 11/18/14 1128 11/18/14 1248   11/18/14 1400  vancomycin (VANCOCIN) 50 mg/mL oral solution 125 mg  Status:  Discontinued     125 mg Oral 4 times per [name] 11/18/14 1248 11/18/14 1320   11/18/14 1400  vancomycin (VANCOCIN) 50 mg/mL oral solution 250 mg  Status:  Discontinued     250 mg Oral 4 times per [name] 11/18/14 1320 11/19/14 1001      DVT Prophylaxis: Coumadin/Heparin gtt  Code Status: Full code   Family Communication Spouse over the phone on 8/3  Procedures: None  CONSULTS:  nephrology and neurology  Time spent 35 minutes-Greater than 50% of this time was spent in counseling, explanation of diagnosis, planning of further management, and coordination of care.  MEDICATIONS: Scheduled Meds: . acyclovir  400 mg Intravenous Q24H  . antiseptic oral rinse  7 mL Mouth Rinse 6 times per  [name]  . calcitRIOL  1.75 mcg Oral Q M,W,F-HD  . clobetasol cream  1 application Topical BID  . colchicine  0.3 mg Oral Once per [name] on Mon Thu  . diltiazem  30 mg Oral 4 times per [name]  . feeding supplement (NEPRO CARB STEADY)  237 mL Oral BID BM  . insulin aspart  0-9 Units Subcutaneous TID WC  . insulin glargine  20 Units Subcutaneous Daily  . metoprolol  25 mg  Oral BID  . multivitamin  1 tablet Oral QHS  . phenytoin (DILANTIN) IV  100 mg Intravenous 3 times per [name]  . saccharomyces boulardii  250 mg Oral BID  . sevelamer carbonate  1,600 mg Oral TID WC  . vancomycin  500 mg Oral 4 times per [name]   Continuous Infusions: . sodium chloride 10 mL/hr at 11/18/14 2300  . insulin (NOVOLIN-R) infusion Stopped (11/18/14 1600)   PRN Meds:.sodium chloride, sodium chloride, camphor-menthol, diphenhydrAMINE, feeding supplement (NEPRO CARB STEADY), heparin, heparin, heparin, heparin, lidocaine (PF), lidocaine-prilocaine, pentafluoroprop-tetrafluoroeth    PHYSICAL EXAM: Vital signs in last 24 hours: Filed Vitals:   11/23/14 0930 11/23/14 1000 11/23/14 1030 11/23/14 1100  BP: 131/96 120/97 141/63 150/105  Pulse: 85 112 106 113  Temp:      TempSrc:      Resp:      Height:      Weight:      SpO2:        Weight change: -1.5 kg (-3 lb 4.9 oz) Filed Weights   11/21/14 1229 11/23/14 0439 11/23/14 0743  Weight: 79.6 kg (175 lb 7.8 oz) 80.6 kg (177 lb 11.1 oz) 73.4 kg (161 lb 13.1 oz)   Body mass index is 21.94 kg/(m^2).   Gen Exam: Awake but very confused   Neck: Supple, No JVD.   Chest: B/L Clear.   CVS: S1 S2 Regular, no murmurs.  Abdomen: soft, BS +, non tender, non distended.  Extremities: no edema, lower extremities warm to touch. Neurologic: Non Focal.  Skin:papular dry rash all over mostly on the back Wounds: N/A.    Intake/Output from previous [name]:  Intake/Output Summary (Last 24 hours) at 11/23/14 1122 Last data filed at 11/23/14 0639  Gross per 24 hour  Intake 1259.38 ml  Output    625 ml  Net 634.38 ml     LAB RESULTS: CBC  Recent Labs Lab 11/19/14 0550 11/20/14 0605 11/21/14 0830 11/22/14 0511 11/23/14 0532  WBC 27.1* 16.3* 15.6* 13.9* 12.0*  HGB 12.7* 12.7* 11.9* 11.3* 12.3*  HCT 38.1* 37.9* 34.3* 34.2* 36.4*  PLT 229 193 178 142* 139*  MCV 93.6 94.5 90.7 92.4 93.6  MCH 31.2 31.7 31.5 30.5 31.6  MCHC 33.3 33.5  34.7 33.0 33.8  RDW 17.3* 17.7* 17.3* 17.3* 17.5*    Chemistries   Recent Labs Lab 11/19/14 0550 11/20/14 1102 11/21/14 0830 11/22/14 0511 11/23/14 0532  NA 127* 132* 134* 133* 135  K 5.9* 4.7 4.0 3.7 4.1  CL 92* 97* 99* 97* 96*  CO2 21* 21* 23 24 24   GLUCOSE 263* 307* 60* 54* 52*  BUN 94* 72* 88* 46* 57*  CREATININE 6.88* 5.62* 5.89* 3.97* 4.68*  CALCIUM 8.2* 8.0* 8.0* 7.6* 7.9*    CBG:  Recent Labs Lab 11/22/14 0811 11/22/14 0848 11/22/14 1150 11/22/14 1654 11/22/14 2140  GLUCAP 46* 74 127* 109* 143*    GFR Estimated Creatinine Clearance: 17.6 mL/min (by C-G formula based on Cr of 4.68).  Coagulation profile  Recent Labs  Lab 11/20/14 0605 11/21/14 0628 11/21/14 0830 11/22/14 0511 11/23/14 0532  INR 3.06* >10.00* 1.66* 1.45 1.31    Cardiac Enzymes No results for input(s): CKMB, TROPONINI, MYOGLOBIN in the last 168 hours.  Invalid input(s): CK  Invalid input(s): POCBNP No results for input(s): DDIMER in the last 72 hours. No results for input(s): HGBA1C in the last 72 hours. No results for input(s): CHOL, HDL, LDLCALC, TRIG, CHOLHDL, LDLDIRECT in the last 72 hours.  Recent Labs  11/21/14 1347  TSH 1.386    Recent Labs  11/21/14 1347  VITAMINB12 1116*  FOLATE 24.1   No results for input(s): LIPASE, AMYLASE in the last 72 hours.  Urine Studies No results for input(s): UHGB, CRYS in the last 72 hours.  Invalid input(s): UACOL, UAPR, USPG, UPH, UTP, UGL, UKET, UBIL, UNIT, UROB, ULEU, UEPI, UWBC, URBC, UBAC, CAST, UCOM, BILUA  MICROBIOLOGY: Recent Results (from the past 240 hour(s))  Blood culture (routine x 2)     Status: None (Preliminary result)   Collection Time: 11/18/14  6:30 AM  Result Value Ref Range Status   Specimen Description BLOOD LEFT ARM  Final   Special Requests BOTTLES DRAWN AEROBIC AND ANAEROBIC 5CC  Final   Culture NO GROWTH 4 DAYS  Final   Report Status PENDING  Incomplete  Blood culture (routine x 2)     Status:  None (Preliminary result)   Collection Time: 11/18/14  6:30 AM  Result Value Ref Range Status   Specimen Description BLOOD LEFT HAND  Final   Special Requests BOTTLES DRAWN AEROBIC AND ANAEROBIC 5CC  Final   Culture NO GROWTH 4 DAYS  Final   Report Status PENDING  Incomplete  Urine culture     Status: None   Collection Time: 11/18/14 11:30 AM  Result Value Ref Range Status   Specimen Description URINE, RANDOM  Final   Special Requests NONE  Final   Culture   Final    >=100,000 COLONIES/mL ENTEROCOCCUS SPECIES 30,000 COLONIES/mL ESCHERICHIA COLI    Report Status 11/21/2014 FINAL  Final   Organism ID, Bacteria ENTEROCOCCUS SPECIES  Final   Organism ID, Bacteria ESCHERICHIA COLI  Final      Susceptibility   Escherichia coli - MIC*    AMPICILLIN >=32 RESISTANT Resistant     CEFAZOLIN >=64 RESISTANT Resistant     CEFTRIAXONE <=1 SENSITIVE Sensitive     CIPROFLOXACIN <=0.25 SENSITIVE Sensitive     GENTAMICIN <=1 SENSITIVE Sensitive     IMIPENEM <=0.25 SENSITIVE Sensitive     NITROFURANTOIN <=16 SENSITIVE Sensitive     TRIMETH/SULFA <=20 SENSITIVE Sensitive     AMPICILLIN/SULBACTAM 8 SENSITIVE Sensitive     PIP/TAZO <=4 SENSITIVE Sensitive     * 30,000 COLONIES/mL ESCHERICHIA COLI   Enterococcus species - MIC*    AMPICILLIN <=2 SENSITIVE Sensitive     LEVOFLOXACIN 1 SENSITIVE Sensitive     NITROFURANTOIN <=16 SENSITIVE Sensitive     VANCOMYCIN 2 SENSITIVE Sensitive     LINEZOLID 1 SENSITIVE Sensitive     * >=100,000 COLONIES/mL ENTEROCOCCUS SPECIES  MRSA PCR Screening     Status: None   Collection Time: 11/18/14 11:30 AM  Result Value Ref Range Status   MRSA by PCR NEGATIVE NEGATIVE Final    Comment:        The GeneXpert MRSA Assay (FDA approved for NASAL specimens only), is one component of a comprehensive MRSA colonization surveillance program. It is not intended to diagnose MRSA infection nor to guide or monitor treatment  for MRSA infections.   Clostridium Difficile  by PCR (not at [facility])     Status: None   Collection Time: 11/19/14  3:50 PM  Result Value Ref Range Status   Toxigenic C Difficile by pcr NEGATIVE NEGATIVE Final    RADIOLOGY STUDIES/RESULTS: Dg Chest 2 View  11/18/2014   CLINICAL DATA:  Altered mental status. Shortness of breath and nausea today.  EXAM: CHEST  2 VIEW  COMPARISON:  11/05/2014  FINDINGS: Chronic cardiomegaly and vascular congestion, unchanged. Diminished right pleural effusion and basilar opacity from prior exam. No new confluent airspace disease or pneumothorax. No acute osseous abnormalities.  IMPRESSION: 1. Diminished right pleural effusion and right basilar opacity. 2. Stable chronic cardiomegaly and vascular congestion.   Electronically Signed   By: Jeb Levering M.D.   On: 11/18/2014 06:21   Dg Chest 2 View  11/05/2014   CLINICAL DATA:  Dyspnea for 1 [name]  EXAM: CHEST  2 VIEW  COMPARISON:  July 03, 2014  FINDINGS: The heart size and mediastinal contours are stable. There is mild patchy consolidation of lateral right lung base with small right pleural effusion. There is mild chronic central pulmonary vascular congestion. The visualized skeletal structures are stable.  IMPRESSION: Chronic central pulmonary vascular congestion.  Small right pleural effusion with associated mild patchy opacity of right lung base which could be due to atelectasis but superimposed pneumonia is not excluded.   Electronically Signed   By: Abelardo Diesel M.D.   On: 11/05/2014 21:54   Ct Head Wo Contrast  11/18/2014   CLINICAL DATA:  Altered mental status.  EXAM: CT HEAD WITHOUT CONTRAST  TECHNIQUE: Contiguous axial images were obtained from the base of the skull through the vertex without intravenous contrast.  COMPARISON:  None.  FINDINGS: No intracranial hemorrhage, mass effect, or midline shift. Small focal encephalomalacia in the left occipital lobe. No hydrocephalus. The basilar cisterns are patent. No evidence of territorial infarct. No intracranial  fluid collection. Calvarium is intact. Included paranasal sinuses and mastoid air cells are well aerated.  IMPRESSION: 1.  No acute intracranial abnormality. 2. Small focal encephalomalacia in the left occipital lobe.   Electronically Signed   By: Jeb Levering M.D.   On: 11/18/2014 06:13   Mr Brain Wo Contrast  11/21/2014   CLINICAL DATA:  Altered mental status. Encephalopathy. Atrial fibrillation.  EXAM: MRI HEAD WITHOUT CONTRAST  TECHNIQUE: Multiplanar, multiecho pulse sequences of the brain and surrounding structures were obtained without intravenous contrast.  COMPARISON:  CT head without contrast from the same [name].  FINDINGS: Restricted diffusion and increased T2 signal is present within the medial temporal lobes and hippocampal structures bilaterally. Inferior temporal lobes are intact.  Moderate generalized atrophy and diffuse periventricular T2 changes bilaterally are advanced for age. The ventricles are of normal size.  Flow is present in the major intracranial arteries. The globes and orbits are intact. The paranasal sinuses are clear. There is some fluid in the right mastoid air cells. No obstructing nasopharyngeal lesion is evident.  IMPRESSION: Abnormal T2 signal and restricted diffusion within the medial temporal lobes and hippocampus bilaterally. 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 There is no hemorrhage or signal change along the undersurface the temporal lobes, but herpes encephalitis is also considered. Status epilepticus is also on the differential diagnosis.  Atrophy in T2 signal changes are advanced for age. This is nonspecific, but likely reflects the sequela of chronic microvascular ischemia.  These results will be called to the ordering clinician  or representative by the Radiologist Assistant, and communication documented in the PACS or zVision Dashboard.   Electronically Signed   By: San Morelle M.D.   On: 11/21/2014 19:01   US Abdomen Complete  11/06/2014    CLINICAL DATA:  Abnormal liver function tests  EXAM: ULTRASOUND ABDOMEN COMPLETE  COMPARISON:  None.  FINDINGS: Gallbladder: No gallstones or wall thickening visualized. No sonographic Murphy sign noted.  Common bile duct: Diameter: 4 mm  Liver: Minimally nodular contour with no focal abnormalities.  IVC: No abnormality visualized.  Pancreas: Visualized portion unremarkable.  Spleen: Size and appearance within normal limits.  Right Kidney: Length: 10.7 cm. Midpole cyst measuring 14 mm. Mildly increased echogenicity.  Left Kidney: Length: 10.8 cm. Lower pole cyst measures 1 cm. Mildly increased echogenicity.  Abdominal aorta: No aneurysm identified. Limited visualization distally due to bowel gas.  Other findings: Right pleural effusion.  Small volume ascites.  IMPRESSION: Mildly nodular liver contour with small volume of ascites and right pleural effusion. Possibility of cirrhosis not excluded.  Evidence of medical renal disease.   Electronically Signed   By: Skipper Cliche M.D.   On: 11/06/2014 21:38    Oren Binet, MD  [facility] Pager:[phone]  If 7PM-7AM, please contact night-coverage [email or web address] Password [email or web address] 11/23/2014, 11:22 AM   LOS: 5 days

## 2014-11-23 NOTE — Progress Notes (Signed)
Patient ID: Joseph [name], male   DOB: April 03, 1956, 59 y.o.   MRN: [id number]   [facility] Progress Note    Subjective:    Seen on dialysis Thinks he is "going down [address]"  Year is 2012  Recognizes that he is getting dialysis On IV dilantin for sz activity noted on EEG, and on empiric acyclovir pending LP.  Neuro note says to be started on Vimpat.  Heparin running but not on MAR - appears should have ben discontinued this am   Objective:   BP 135/90 mmHg  Pulse 90  Temp(Src) 99.1 F (37.3 C) (Oral)  Resp 21  Ht 6' (1.829 m)  Wt 73.4 kg (161 lb 13.1 oz)  BMI 21.94 kg/m2  SpO2 100%  Intake/Output: I/O last 3 completed shifts: In: 1459.4 [P.O.:680; I.V.:370.5; IV Piggyback:408.9] Out: 626 [Urine:625; Stool:1]   Intake/Output this shift:    Weight change: -1.5 kg (-3 lb 4.9 oz)  Physical Exam: Gen:WD WN AAM in NAD but remains oriented to person only Skin: ? vesicles left shoulder and ? left upper back (can't turn him over in HD to evaluate with arm cannulated)  CVS: Irregularly irregular with AF on tele Resp:Lungs clear OVZ:CHYIFO Ext:L forearm AVG - currently cannulated for HD No edema of LE's   Recent Labs  11/22/14 0511 11/23/14 0532  NA 133* 135  K 3.7 4.1  CL 97* 96*  CO2 24 24  GLUCOSE 54* 52*  BUN 46* 57*  CREATININE 3.97* 4.68*  CALCIUM 7.6* 7.9*  PHOS 4.1 4.5   Liver Function Tests:  Recent Labs  11/20/14 1102  11/22/14 0511 11/23/14 0532  AST 161*  --   --  127*  ALT 230*  --   --  169*  ALKPHOS 180*  --   --  209*  BILITOT 1.5*  --   --  1.5*  PROT 6.0*  --   --  6.3*  ALBUMIN 3.3*  < > 2.9* 3.2*  3.2*  < > = values in this interval not displayed. CBC:  Recent Labs  11/22/14 0511 11/23/14 0532  WBC 13.9* 12.0*  HGB 11.3* 12.3*  HCT 34.2* 36.4*  MCV 92.4 93.6  PLT 142* 139*   Anemia Panel:  Recent Labs  11/20/14 1102 11/21/14 1347  VITAMINB12  --  1116*  FOLATE  --  24.1  FERRITIN 2764*  --    CBG  (last 3)   Recent Labs  11/22/14 1150 11/22/14 1654 11/22/14 2140  GLUCAP 127* 109* 143*     Medications:    . acyclovir  400 mg Intravenous Q24H  . antiseptic oral rinse  7 mL Mouth Rinse 6 times per day  . calcitRIOL  1.75 mcg Oral Q M,W,F-HD  . clobetasol cream  1 application Topical BID  . colchicine  0.3 mg Oral Once per day on Mon Thu  . diltiazem  30 mg Oral 4 times per day  . feeding supplement (NEPRO CARB STEADY)  237 mL Oral BID BM  . insulin aspart  0-9 Units Subcutaneous TID WC  . insulin glargine  20 Units Subcutaneous Daily  . metoprolol  25 mg Oral BID  . multivitamin  1 tablet Oral QHS  . phenytoin (DILANTIN) IV  100 mg Intravenous 3 times per day  . saccharomyces boulardii  250 mg Oral BID  . sevelamer carbonate  1,600 mg Oral TID WC  . vancomycin  500 mg Oral 4 times per day   . sodium chloride  10 mL/hr at 11/18/14 2300  . insulin (NOVOLIN-R) infusion Stopped (11/18/14 1600)   Dialysis Orders: [facility] 180 EDW 80 3 K 2.5 Ca 450/800 profile 2 AVGG heparin 6100 no Fe or ESA calcitriol 1.75 Recent labs; 7/27 Hgb 11.6 WBC ^ at 12.9 89% N ferritin 1122 42% sat Ca/P ok iPTH 427 stable hgb A1C 10.7 up from 6.1 in April and 7.4 in January   Assessment/ Plan:   1. DKA- Resolved.  Back on sq insulin 2. C. Diff colitis- on appropriate abx. CDiff negative as of 8/1 (+ 7/19)  3. AMS - seizure activity on EEG. 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 On anticonvulsants, acyclovir and pending LP  4. Seizures - IV dilantin and neuro note says to start Vimpat 5. ESRD- continue with HD MWF 6. Enterococcal UTI- on no prolonged rx at this time. Rec'd 1 dose fosfomycin on 8/2 7. 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 Abnormal LFT's-? Etiology. No abd pain. Has had no imaging studies.Ammonia level 44 on 8/2 10. Nutrition:per  primary 11. Hypertension: stable 12. A fib- rate controlled, on IV heparin/coumadin on hold - to facilitate LP - currently heparin is still running but does not appear on MAR - RN to investigate discrepancy - appears should have been stopped 7AM  Awa Bachicha B 11/23/2014, 9:00 AM

## 2014-11-23 NOTE — Progress Notes (Signed)
Subjective: patient remains confused with time, date, place and year. No other complaints.   Objective: Current vital signs: BP 135/90 mmHg  Pulse 90  Temp(Src) 99.1 F (37.3 C) (Oral)  Resp 21  Ht 6' (1.829 m)  Wt 73.4 kg (161 lb 13.1 oz)  BMI 21.94 kg/m2  SpO2 100% Vital signs in last 24 hours: Temp:  [98.5 F (36.9 C)-99.1 F (37.3 C)] 99.1 F (37.3 C) (08/05 0438) Pulse Rate:  [82-95] 90 (08/05 0803) Resp:  [17-21] 21 (08/05 0743) BP: (114-155)/(70-92) 135/90 mmHg (08/05 0803) SpO2:  [96 %-100 %] 100 % (08/05 0743) Weight:  [73.4 kg (161 lb 13.1 oz)-80.6 kg (177 lb 11.1 oz)] 73.4 kg (161 lb 13.1 oz) (08/05 0743)  Intake/Output from previous day: 08/04 0701 - 08/05 0700 In: 1259.4 [P.O.:480; I.V.:370.5; IV Piggyback:408.9] Out: 625 [Urine:625] Intake/Output this shift:   Nutritional status: Diet renal with fluid restriction Fluid restriction:: 1200 mL Fluid; Room service appropriate?: Yes; Fluid consistency:: Thin  Neurologic Exam: Mental Status: Alert,not oriented to person, year or place but calm and follows all commands. Speech fluent without evidence of aphasia. Able to follow 3 step commands without difficulty. Cranial Nerves: II: Visual fields grossly normal, pupils equal, round, reactive to light and accommodation III,IV, VI: ptosis not present, extra-ocular motions intact bilaterally V,VII: smile symmetric, facial light touch sensation normal bilaterally VIII: hearing normal bilaterally IX,X: uvula rises symmetrically XI: bilateral shoulder shrug XII: midline tongue extension without atrophy or fasciculations  Motor: Right :Upper extremity 5/5Left: Upper extremity 5/5 Lower extremity 5/5Lower extremity 5/5 Tone and bulk:normal tone throughout; no atrophy noted Sensory: Pinprick and light touch intact throughout, bilaterally Deep Tendon  Reflexes:  Right: Upper Extremity Left: Upper extremity   biceps (C-5 to C-6) 2/4 biceps (C-5 to C-6) 2/4 tricep (C7) 2/4triceps (C7) 2/4 Brachioradialis (C6) 2/4Brachioradialis (C6) 2/4  Lower Extremity Lower Extremity  quadriceps (L-2 to L-4) 2/4 quadriceps (L-2 to L-4) 2/4 Achilles (S1) 2/4Achilles (S1) 2/4  Plantars: Right: downgoingLeft: downgoing  Lab Results: Basic Metabolic Panel:  Recent Labs Lab 11/19/14 0550 11/20/14 1102 11/21/14 0800 11/21/14 0830 11/22/14 0511 11/23/14 0532  NA 127* 132*  --  134* 133* 135  K 5.9* 4.7  --  4.0 3.7 4.1  CL 92* 97*  --  99* 97* 96*  CO2 21* 21*  --  23 24 24   GLUCOSE 263* 307*  --  60* 54* 52*  BUN 94* 72*  --  88* 46* 57*  CREATININE 6.88* 5.62*  --  5.89* 3.97* 4.68*  CALCIUM 8.2* 8.0*  --  8.0* 7.6* 7.9*  PHOS 8.8*  --  6.1*  --  4.1 4.5    Liver Function Tests:  Recent Labs Lab 11/18/14 0422  11/20/14 0605 11/20/14 1102 11/21/14 0800 11/22/14 0511 11/23/14 0532  AST 135*  --  178* 161*  --   --  127*  ALT 232*  --  232* 230*  --   --  169*  ALKPHOS 206*  --  181* 180*  --   --  209*  BILITOT 1.5*  --  1.3* 1.5*  --   --  1.5*  PROT 6.0*  --  5.8* 6.0*  --   --  6.3*  ALBUMIN 3.0*  < > 3.0* 3.3* 3.0* 2.9* 3.2*  3.2*  < > = values in this interval not displayed.  Recent Labs Lab 11/18/14 0422  LIPASE 97*    Recent Labs Lab 11/18/14 0635 11/20/14 1102  AMMONIA 35 44*  CBC:  Recent Labs Lab 11/19/14 0550 11/20/14 0605 11/21/14 0830 11/22/14 0511 11/23/14 0532  WBC 27.1* 16.3* 15.6* 13.9* 12.0*  HGB 12.7* 12.7* 11.9* 11.3* 12.3*  HCT 38.1* 37.9* 34.3* 34.2* 36.4*  MCV 93.6 94.5 90.7 92.4 93.6  PLT 229 193 178 142* 139*    Cardiac Enzymes: No results for input(s): CKTOTAL, CKMB, CKMBINDEX, TROPONINI in  the last 168 hours.  Lipid Panel: No results for input(s): CHOL, TRIG, HDL, CHOLHDL, VLDL, LDLCALC in the last 168 hours.  CBG:  Recent Labs Lab 11/22/14 0811 11/22/14 0848 11/22/14 1150 11/22/14 1654 11/22/14 2140  GLUCAP 62* 74 127* 109* 143*    Microbiology: Results for orders placed or performed during the hospital encounter of 11/18/14  Blood culture (routine x 2)     Status: None (Preliminary result)   Collection Time: 11/18/14  6:30 AM  Result Value Ref Range Status   Specimen Description BLOOD LEFT ARM  Final   Special Requests BOTTLES DRAWN AEROBIC AND ANAEROBIC 5CC  Final   Culture NO GROWTH 4 DAYS  Final   Report Status PENDING  Incomplete  Blood culture (routine x 2)     Status: None (Preliminary result)   Collection Time: 11/18/14  6:30 AM  Result Value Ref Range Status   Specimen Description BLOOD LEFT HAND  Final   Special Requests BOTTLES DRAWN AEROBIC AND ANAEROBIC 5CC  Final   Culture NO GROWTH 4 DAYS  Final   Report Status PENDING  Incomplete  Urine culture     Status: None   Collection Time: 11/18/14 11:30 AM  Result Value Ref Range Status   Specimen Description URINE, RANDOM  Final   Special Requests NONE  Final   Culture   Final    >=100,000 COLONIES/mL ENTEROCOCCUS SPECIES 30,000 COLONIES/mL ESCHERICHIA COLI    Report Status 11/21/2014 FINAL  Final   Organism ID, Bacteria ENTEROCOCCUS SPECIES  Final   Organism ID, Bacteria ESCHERICHIA COLI  Final      Susceptibility   Escherichia coli - MIC*    AMPICILLIN >=32 RESISTANT Resistant     CEFAZOLIN >=64 RESISTANT Resistant     CEFTRIAXONE <=1 SENSITIVE Sensitive     CIPROFLOXACIN <=0.25 SENSITIVE Sensitive     GENTAMICIN <=1 SENSITIVE Sensitive     IMIPENEM <=0.25 SENSITIVE Sensitive     NITROFURANTOIN <=16 SENSITIVE Sensitive     TRIMETH/SULFA <=20 SENSITIVE Sensitive     AMPICILLIN/SULBACTAM 8 SENSITIVE Sensitive     PIP/TAZO <=4 SENSITIVE Sensitive     * 30,000 COLONIES/mL ESCHERICHIA COLI    Enterococcus species - MIC*    AMPICILLIN <=2 SENSITIVE Sensitive     LEVOFLOXACIN 1 SENSITIVE Sensitive     NITROFURANTOIN <=16 SENSITIVE Sensitive     VANCOMYCIN 2 SENSITIVE Sensitive     LINEZOLID 1 SENSITIVE Sensitive     * >=100,000 COLONIES/mL ENTEROCOCCUS SPECIES  MRSA PCR Screening     Status: None   Collection Time: 11/18/14 11:30 AM  Result Value Ref Range Status   MRSA by PCR NEGATIVE NEGATIVE Final    Comment:        The GeneXpert MRSA Assay (FDA approved for NASAL specimens only), is one component of a comprehensive MRSA colonization surveillance program. It is not intended to diagnose MRSA infection nor to guide or monitor treatment for MRSA infections.   Clostridium Difficile by PCR (not at [facility])     Status: None   Collection Time: 11/19/14  3:50 PM  Result Value Ref Range Status  Toxigenic C Difficile by pcr NEGATIVE NEGATIVE Final    Coagulation Studies:  Recent Labs  11/21/14 0628 11/21/14 0830 11/22/14 0511 11/23/14 0532  LABPROT >90.0* 19.6* 17.8* 16.4*  INR >10.00* 1.66* 1.45 1.31    Imaging: Mr Brain Wo Contrast  11/21/2014   CLINICAL DATA:  Altered mental status. Encephalopathy. Atrial fibrillation.  EXAM: MRI HEAD WITHOUT CONTRAST  TECHNIQUE: Multiplanar, multiecho pulse sequences of the brain and surrounding structures were obtained without intravenous contrast.  COMPARISON:  CT head without contrast from the same day.  FINDINGS: Restricted diffusion and increased T2 signal is present within the medial temporal lobes and hippocampal structures bilaterally. Inferior temporal lobes are intact.  Moderate generalized atrophy and diffuse periventricular T2 changes bilaterally are advanced for age. The ventricles are of normal size.  Flow is present in the major intracranial arteries. The globes and orbits are intact. The paranasal sinuses are clear. There is some fluid in the right mastoid air cells. No obstructing nasopharyngeal lesion is evident.   IMPRESSION: Abnormal T2 signal and restricted diffusion within the medial temporal lobes and hippocampus bilaterally. 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 There is no hemorrhage or signal change along the undersurface the temporal lobes, but herpes encephalitis is also considered. Status epilepticus is also on the differential diagnosis.  Atrophy in T2 signal changes are advanced for age. This is nonspecific, but likely reflects the sequela of chronic microvascular ischemia.  These results will be called to the ordering clinician or representative by the Radiologist Assistant, and communication documented in the PACS or zVision Dashboard.   Electronically Signed   By: San Morelle M.D.   On: 11/21/2014 19:01    Medications:  Scheduled: . acyclovir  400 mg Intravenous Q24H  . antiseptic oral rinse  7 mL Mouth Rinse 6 times per day  . calcitRIOL  1.75 mcg Oral Q M,W,F-HD  . clobetasol cream  1 application Topical BID  . colchicine  0.3 mg Oral Once per day on Mon Thu  . diltiazem  30 mg Oral 4 times per day  . feeding supplement (NEPRO CARB STEADY)  237 mL Oral BID BM  . insulin aspart  0-9 Units Subcutaneous TID WC  . insulin glargine  20 Units Subcutaneous Daily  . metoprolol  25 mg Oral BID  . multivitamin  1 tablet Oral QHS  . phenytoin (DILANTIN) IV  100 mg Intravenous 3 times per day  . saccharomyces boulardii  250 mg Oral BID  . sevelamer carbonate  1,600 mg Oral TID WC  . vancomycin  500 mg Oral 4 times per day    Assessment/Plan: [name] with AMS in the setting of C. Diff, hyperglycemia, hyponatremia and elevated WBC. Exam is non-focal other than confusion.EEG does show seizure activity and patient will be started on dilantin. MRI also shows abnormal T2 signal and restricted diffusion within the medial temporal lobes and hippocampus bilaterally. 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  Recommend: 1) Continue Dilantin  per pharmacy 2) LP today 3) Continue Acyclovir until LP results. If limbic encephalitis would consider IV steroids 4) Will continue to follow   Jim Like, DO [facility] [phone]  If 7pm- 7am, please page neurology on call as listed in [place].

## 2014-11-23 NOTE — Procedures (Signed)
I have personally attended this patient's dialysis session.   L AVG cannulated for HD and running at 400 OLC green. Pt confused, but not agitated. K 4.1  Jamal Maes, MD [facility] [phone] Pager 11/23/2014, 9:20 AM

## 2014-11-24 DIAGNOSIS — R21 Rash and other nonspecific skin eruption: Secondary | ICD-10-CM

## 2014-11-24 DIAGNOSIS — R410 Disorientation, unspecified: Secondary | ICD-10-CM

## 2014-11-24 LAB — PROTIME-INR
INR: 1.29 (ref 0.00–1.49)
Prothrombin Time: 16.3 seconds — ABNORMAL HIGH (ref 11.6–15.2)

## 2014-11-24 LAB — COMPREHENSIVE METABOLIC PANEL
ALBUMIN: 2.9 g/dL — AB (ref 3.5–5.0)
ALT: 136 U/L — ABNORMAL HIGH (ref 17–63)
ANION GAP: 10 (ref 5–15)
AST: 107 U/L — ABNORMAL HIGH (ref 15–41)
Alkaline Phosphatase: 208 U/L — ABNORMAL HIGH (ref 38–126)
BILIRUBIN TOTAL: 1.5 mg/dL — AB (ref 0.3–1.2)
BUN: 29 mg/dL — AB (ref 6–20)
CHLORIDE: 98 mmol/L — AB (ref 101–111)
CO2: 29 mmol/L (ref 22–32)
Calcium: 7.7 mg/dL — ABNORMAL LOW (ref 8.9–10.3)
Creatinine, Ser: 3.2 mg/dL — ABNORMAL HIGH (ref 0.61–1.24)
GFR calc non Af Amer: 20 mL/min — ABNORMAL LOW (ref >60)
GFR, EST AFRICAN AMERICAN: 23 mL/min — AB (ref >60)
GLUCOSE: 50 mg/dL — AB (ref 65–99)
Potassium: 4 mmol/L (ref 3.5–5.1)
Sodium: 137 mmol/L (ref 135–145)
Total Protein: 5.7 g/dL — ABNORMAL LOW (ref 6.5–8.1)

## 2014-11-24 LAB — GLUCOSE, CAPILLARY
GLUCOSE-CAPILLARY: 138 mg/dL — AB (ref 65–99)
Glucose-Capillary: 317 mg/dL — ABNORMAL HIGH (ref 65–99)
Glucose-Capillary: 373 mg/dL — ABNORMAL HIGH (ref 65–99)
Glucose-Capillary: 472 mg/dL — ABNORMAL HIGH (ref 65–99)

## 2014-11-24 LAB — CBC
HCT: 35.3 % — ABNORMAL LOW (ref 39.0–52.0)
Hemoglobin: 11.7 g/dL — ABNORMAL LOW (ref 13.0–17.0)
MCH: 31.5 pg (ref 26.0–34.0)
MCHC: 33.1 g/dL (ref 30.0–36.0)
MCV: 94.9 fL (ref 78.0–100.0)
PLATELETS: 136 10*3/uL — AB (ref 150–400)
RBC: 3.72 MIL/uL — ABNORMAL LOW (ref 4.22–5.81)
RDW: 17.7 % — ABNORMAL HIGH (ref 11.5–15.5)
WBC: 13.6 10*3/uL — ABNORMAL HIGH (ref 4.0–10.5)

## 2014-11-24 LAB — HERPES SIMPLEX VIRUS(HSV) DNA BY PCR
HSV 1 DNA: NEGATIVE
HSV 2 DNA: NEGATIVE

## 2014-11-24 LAB — HEPARIN LEVEL (UNFRACTIONATED)
HEPARIN UNFRACTIONATED: 0.31 [IU]/mL (ref 0.30–0.70)
Heparin Unfractionated: 0.19 IU/mL — ABNORMAL LOW (ref 0.30–0.70)

## 2014-11-24 LAB — TROPONIN I: TROPONIN I: 0.51 ng/mL — AB (ref <0.031)

## 2014-11-24 MED ORDER — WARFARIN - PHARMACIST DOSING INPATIENT
Freq: Every day | Status: DC
  Administered 2014-11-24 – 2014-11-25 (×2)

## 2014-11-24 MED ORDER — SODIUM CHLORIDE 0.9 % IV SOLN
1000.0000 mg | Freq: Every day | INTRAVENOUS | Status: AC
  Administered 2014-11-24 – 2014-11-28 (×5): 1000 mg via INTRAVENOUS
  Filled 2014-11-24 (×5): qty 8

## 2014-11-24 MED ORDER — INSULIN ASPART 100 UNIT/ML ~~LOC~~ SOLN: 0.0000 [IU] | Freq: Three times a day (TID) | SUBCUTANEOUS | Status: DC

## 2014-11-24 MED ORDER — INSULIN GLARGINE 100 UNIT/ML ~~LOC~~ SOLN
20.0000 [IU] | Freq: Two times a day (BID) | SUBCUTANEOUS | Status: DC
  Administered 2014-11-24: 20 [IU] via SUBCUTANEOUS
  Filled 2014-11-24 (×2): qty 0.2

## 2014-11-24 MED ORDER — WARFARIN SODIUM 5 MG PO TABS
5.0000 mg | ORAL_TABLET | Freq: Once | ORAL | Status: AC
  Administered 2014-11-24: 5 mg via ORAL
  Filled 2014-11-24: qty 1

## 2014-11-24 MED ORDER — PANTOPRAZOLE SODIUM 20 MG PO TBEC
20.0000 mg | DELAYED_RELEASE_TABLET | Freq: Two times a day (BID) | ORAL | Status: DC
  Administered 2014-11-24 – 2014-12-02 (×16): 20 mg via ORAL
  Filled 2014-11-24 (×15): qty 1

## 2014-11-24 NOTE — Progress Notes (Signed)
Subjective: patient remains confused with time, date, place and year.LP completed without complications.  CSF results show 1 WBC, negative culture and Gram stain, protein 74 (elevated). HSV pending.    Objective: Current vital signs: BP 110/68 mmHg  Pulse 101  Temp(Src) 98.6 F (37 C) (Oral)  Resp 18  Ht 6' (1.829 m)  Wt 73.4 kg (161 lb 13.1 oz)  BMI 21.94 kg/m2  SpO2 98% Vital signs in last 24 hours: Temp:  [98.1 F (36.7 C)-98.8 F (37.1 C)] 98.6 F (37 C) (08/06 0436) Pulse Rate:  [78-113] 101 (08/06 0436) Resp:  [14-20] 18 (08/06 0436) BP: (93-150)/(57-105) 110/68 mmHg (08/06 0436) SpO2:  [94 %-100 %] 98 % (08/06 0436)  Intake/Output from previous day: 08/05 0701 - 08/06 0700 In: 688 [P.O.:460; I.V.:120; IV Piggyback:108] Out: 2201 [Urine:200] Intake/Output this shift:   Nutritional status: Diet renal with fluid restriction Fluid restriction:: 1200 mL Fluid; Room service appropriate?: Yes; Fluid consistency:: Thin  Neurologic Exam: Mental Status: Alert,not oriented to person, year or place but calm and follows all commands. Speech fluent without evidence of aphasia.Intermittently following commands.  Cranial Nerves: II: Visual fields grossly normal, pupils equal, round, reactive to light  III,IV, VI: ptosis not present, extra-ocular motions intact bilaterally V,VII: smile symmetric, facial light touch sensation normal bilaterally  Motor: Right :Upper extremity 5/5Left: Upper extremity 5/5 Lower extremity 5/5Lower extremity 5/5 Tone and bulk:normal tone throughout; no atrophy noted Sensory: light touch intact throughout, bilaterally Plantars: Right: downgoingLeft: downgoing  Lab Results: Basic Metabolic Panel:  Recent Labs Lab 11/19/14 0550 11/20/14 1102 11/21/14 0800 11/21/14 0830 11/22/14 0511  11/23/14 0532 11/24/14 0352  NA 127* 132*  --  134* 133* 135 137  K 5.9* 4.7  --  4.0 3.7 4.1 4.0  CL 92* 97*  --  99* 97* 96* 98*  CO2 21* 21*  --  23 24 24 29   GLUCOSE 263* 307*  --  60* 54* 52* 50*  BUN 94* 72*  --  88* 46* 57* 29*  CREATININE 6.88* 5.62*  --  5.89* 3.97* 4.68* 3.20*  CALCIUM 8.2* 8.0*  --  8.0* 7.6* 7.9* 7.7*  PHOS 8.8*  --  6.1*  --  4.1 4.5  --     Liver Function Tests:  Recent Labs Lab 11/18/14 0422  11/20/14 0605 11/20/14 1102 11/21/14 0800 11/22/14 0511 11/23/14 0532 11/24/14 0352  AST 135*  --  178* 161*  --   --  127* 107*  ALT 232*  --  232* 230*  --   --  169* 136*  ALKPHOS 206*  --  181* 180*  --   --  209* 208*  BILITOT 1.5*  --  1.3* 1.5*  --   --  1.5* 1.5*  PROT 6.0*  --  5.8* 6.0*  --   --  6.3* 5.7*  ALBUMIN 3.0*  < > 3.0* 3.3* 3.0* 2.9* 3.2*  3.2* 2.9*  < > = values in this interval not displayed.  Recent Labs Lab 11/18/14 0422  LIPASE 97*    Recent Labs Lab 11/18/14 0635 11/20/14 1102  AMMONIA 35 44*    CBC:  Recent Labs Lab 11/20/14 0605 11/21/14 0830 11/22/14 0511 11/23/14 0532 11/24/14 0352  WBC 16.3* 15.6* 13.9* 12.0* 13.6*  HGB 12.7* 11.9* 11.3* 12.3* 11.7*  HCT 37.9* 34.3* 34.2* 36.4* 35.3*  MCV 94.5 90.7 92.4 93.6 94.9  PLT 193 178 142* 139* 136*    Cardiac Enzymes:  Recent Labs Lab 11/23/14 1615 11/23/14  2112 11/24/14 0352  TROPONINI 0.52* 0.53* 0.51*    Lipid Panel: No results for input(s): CHOL, TRIG, HDL, CHOLHDL, VLDL, LDLCALC in the last 168 hours.  CBG:  Recent Labs Lab 11/22/14 2140 11/23/14 1241 11/23/14 1651 11/23/14 2100 11/24/14 0745  GLUCAP 143* 74 124* 109* 138*    Microbiology: Results for orders placed or performed during the hospital encounter of 11/18/14  Blood culture (routine x 2)     Status: None   Collection Time: 11/18/14  6:30 AM  Result Value Ref Range Status   Specimen Description BLOOD LEFT ARM  Final   Special Requests BOTTLES DRAWN AEROBIC AND  ANAEROBIC 5CC  Final   Culture NO GROWTH 5 DAYS  Final   Report Status 11/23/2014 FINAL  Final  Blood culture (routine x 2)     Status: None   Collection Time: 11/18/14  6:30 AM  Result Value Ref Range Status   Specimen Description BLOOD LEFT HAND  Final   Special Requests BOTTLES DRAWN AEROBIC AND ANAEROBIC 5CC  Final   Culture NO GROWTH 5 DAYS  Final   Report Status 11/23/2014 FINAL  Final  Urine culture     Status: None   Collection Time: 11/18/14 11:30 AM  Result Value Ref Range Status   Specimen Description URINE, RANDOM  Final   Special Requests NONE  Final   Culture   Final    >=100,000 COLONIES/mL ENTEROCOCCUS SPECIES 30,000 COLONIES/mL ESCHERICHIA COLI    Report Status 11/21/2014 FINAL  Final   Organism ID, Bacteria ENTEROCOCCUS SPECIES  Final   Organism ID, Bacteria ESCHERICHIA COLI  Final      Susceptibility   Escherichia coli - MIC*    AMPICILLIN >=32 RESISTANT Resistant     CEFAZOLIN >=64 RESISTANT Resistant     CEFTRIAXONE <=1 SENSITIVE Sensitive     CIPROFLOXACIN <=0.25 SENSITIVE Sensitive     GENTAMICIN <=1 SENSITIVE Sensitive     IMIPENEM <=0.25 SENSITIVE Sensitive     NITROFURANTOIN <=16 SENSITIVE Sensitive     TRIMETH/SULFA <=20 SENSITIVE Sensitive     AMPICILLIN/SULBACTAM 8 SENSITIVE Sensitive     PIP/TAZO <=4 SENSITIVE Sensitive     * 30,000 COLONIES/mL ESCHERICHIA COLI   Enterococcus species - MIC*    AMPICILLIN <=2 SENSITIVE Sensitive     LEVOFLOXACIN 1 SENSITIVE Sensitive     NITROFURANTOIN <=16 SENSITIVE Sensitive     VANCOMYCIN 2 SENSITIVE Sensitive     LINEZOLID 1 SENSITIVE Sensitive     * >=100,000 COLONIES/mL ENTEROCOCCUS SPECIES  MRSA PCR Screening     Status: None   Collection Time: 11/18/14 11:30 AM  Result Value Ref Range Status   MRSA by PCR NEGATIVE NEGATIVE Final    Comment:        The GeneXpert MRSA Assay (FDA approved for NASAL specimens only), is one component of a comprehensive MRSA colonization surveillance program. It is  not intended to diagnose MRSA infection nor to guide or monitor treatment for MRSA infections.   Clostridium Difficile by PCR (not at [facility])     Status: None   Collection Time: 11/19/14  3:50 PM  Result Value Ref Range Status   Toxigenic C Difficile by pcr NEGATIVE NEGATIVE Final  CSF culture with Stat gram stain     Status: None (Preliminary result)   Collection Time: 11/23/14  4:30 PM  Result Value Ref Range Status   Specimen Description CSF  Final   Special Requests NONE  Final   Gram Stain   Final  WBC PRESENT, PREDOMINANTLY MONONUCLEAR NO ORGANISMS SEEN CYTOSPIN    Culture PENDING  Incomplete   Report Status PENDING  Incomplete    Coagulation Studies:  Recent Labs  11/22/14 0511 11/23/14 0532 11/24/14 0352  LABPROT 17.8* 16.4* 16.3*  INR 1.45 1.31 1.29    Imaging: No results found.  Medications:  Scheduled: . acyclovir  400 mg Intravenous Q24H  . antiseptic oral rinse  7 mL Mouth Rinse 6 times per day  . calcitRIOL  1.75 mcg Oral Q M,W,F-HD  . clobetasol cream  1 application Topical BID  . colchicine  0.3 mg Oral Once per day on Mon Thu  . diltiazem  30 mg Oral 4 times per day  . feeding supplement (NEPRO CARB STEADY)  237 mL Oral BID BM  . insulin aspart  0-9 Units Subcutaneous TID WC  . insulin glargine  20 Units Subcutaneous Daily  . LORazepam  1 mg Intravenous Once  . metoprolol  50 mg Oral BID  . multivitamin  1 tablet Oral QHS  . phenytoin (DILANTIN) IV  100 mg Intravenous 3 times per day  . saccharomyces boulardii  250 mg Oral BID  . sevelamer carbonate  1,600 mg Oral TID WC  . vancomycin  500 mg Oral 4 times per day    Assessment/Plan: [name] with AMS in the setting of C. Diff, hyperglycemia, hyponatremia and elevated WBC. Exam is non-focal other than confusion.EEG shows intermittent seizure activity and patient was started on dilantin.No current seizure activity noted. MRI also shows abnormal T2 signal and restricted diffusion within  the medial temporal lobes and hippocampus bilaterally. 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  CSF results so far not consistent with an infectious process. Will continue acyclovir pending HSV results. Will plan to start high dose IV solumedrol for possible limbic encephalitis.   Recommend: 1) Continue Dilantin per pharmacy 2) Awaiting HSV and further CSF studies 3) Continue Acyclovir until PCR back. 4) Will plan to start IV solumedrol 1000mg  IV x 5 days, will start GI prophylaxis 5) Will continue to monitor   Jim Like, DO [facility] [phone]  If 7pm- 7am, please page neurology on call as listed in [place].

## 2014-11-24 NOTE — Progress Notes (Signed)
ANTICOAGULATION CONSULT NOTE - Follow Up Consult  Pharmacy Consult for Heparin  Indication: atrial fibrillation  Allergies  Allergen Reactions  . Dilaudid [Hydromorphone] Nausea And Vomiting    Patient Measurements: Height: 6' (182.9 cm) Weight: 161 lb 13.1 oz (73.4 kg) (stood to scale ) IBW/kg (Calculated) : 77.6  Vital Signs: Temp: 98.9 F (37.2 C) (08/06 2159) Temp Source: Oral (08/06 2159) BP: 127/86 mmHg (08/06 2159) Pulse Rate: 53 (08/06 2159)  Labs:  Recent Labs  11/22/14 0511  11/23/14 11/23/14 0532 11/23/14 1615 11/23/14 2112 11/24/14 0352 11/24/14 1012 11/24/14 2132  HGB 11.3*  --   --  12.3*  --   --  11.7*  --   --   HCT 34.2*  --   --  36.4*  --   --  35.3*  --   --   PLT 142*  --   --  139*  --   --  136*  --   --   LABPROT 17.8*  --   --  16.4*  --   --  16.3*  --   --   INR 1.45  --   --  1.31  --   --  1.29  --   --   HEPARINUNFRC  --   < > 0.52  --   --   --   --  0.19* 0.31  CREATININE 3.97*  --   --  4.68*  --   --  3.20*  --   --   TROPONINI  --   --   --   --  0.52* 0.53* 0.51*  --   --   < > = values in this interval not displayed.  Estimated Creatinine Clearance: 25.8 mL/min (by C-G formula based on Cr of 3.2).   Assessment: Heparin level therapeutic at 0.31 after rate increase  Goal of Therapy:  Heparin level 0.3-0.7 units/ml Monitor platelets by anticoagulation protocol: Yes   Plan:  -Continue heparin at 1150 units/hr -Confirmatory HL with AM labs -Daily CBC/HL -Monitor for bleeding  Narda Bonds 11/24/2014,11:00 PM

## 2014-11-24 NOTE — Progress Notes (Signed)
Patient Name: Joseph [name] Date of Encounter: 11/24/2014  Principal Problem:   Delirium Active Problems:   Atrial fibrillation-permanent   Obstructive sleep apnea   Pulmonary hypertension   Rash   Volume overload   ESRD on hemodialysis   Supratherapeutic INR   C. difficile diarrhea   DKA, type 2   Hyperkalemia   Encephalopathy   Length of Stay: 6  SUBJECTIVE  Pleasantly confused. No distress Ventricular rate 90-100. On IV heparin.  CURRENT MEDS . acyclovir  400 mg Intravenous Q24H  . antiseptic oral rinse  7 mL Mouth Rinse 6 times per day  . calcitRIOL  1.75 mcg Oral Q M,W,F-HD  . clobetasol cream  1 application Topical BID  . colchicine  0.3 mg Oral Once per day on Mon Thu  . diltiazem  30 mg Oral 4 times per day  . feeding supplement (NEPRO CARB STEADY)  237 mL Oral BID BM  . insulin aspart  0-9 Units Subcutaneous TID WC  . insulin glargine  20 Units Subcutaneous Daily  . LORazepam  1 mg Intravenous Once  . metoprolol  50 mg Oral BID  . multivitamin  1 tablet Oral QHS  . phenytoin (DILANTIN) IV  100 mg Intravenous 3 times per day  . saccharomyces boulardii  250 mg Oral BID  . sevelamer carbonate  1,600 mg Oral TID WC  . vancomycin  500 mg Oral 4 times per day    OBJECTIVE   Intake/Output Summary (Last 24 hours) at 11/24/14 0828 Last data filed at 11/24/14 0700  Gross per 24 hour  Intake    468 ml  Output   2201 ml  Net  -1733 ml   Filed Weights   11/21/14 1229 11/23/14 0439 11/23/14 0743  Weight: 175 lb 7.8 oz (79.6 kg) 177 lb 11.1 oz (80.6 kg) 161 lb 13.1 oz (73.4 kg)    PHYSICAL EXAM Filed Vitals:   11/23/14 1609 11/23/14 2013 11/24/14 0005 11/24/14 0436  BP: 120/76 140/83 130/69 110/68  Pulse: 108 103 80 101  Temp: 98.8 F (37.1 C) 98.5 F (36.9 C) 98.7 F (37.1 C) 98.6 F (37 C)  TempSrc: Oral Oral Oral Oral  Resp: 14 18 18 18   Height:      Weight:      SpO2: 97% 94% 95% 98%   General: Alert, oriented x3, no distress Head: no evidence  of trauma, PERRL, EOMI, no exophtalmos or lid lag, no myxedema, no xanthelasma; normal ears, nose and oropharynx Neck: normal jugular venous pulsations and no hepatojugular reflux; brisk carotid pulses without delay and no carotid bruits Chest: clear to auscultation, no signs of consolidation by percussion or palpation, normal fremitus, symmetrical and full respiratory excursions Cardiovascular: normal position and quality of the apical impulse, irregular rhythm, normal first and second heart sounds, no rubs or gallops, no murmur Abdomen: no tenderness or distention, no masses by palpation, no abnormal pulsatility or arterial bruits, normal bowel sounds, no hepatosplenomegaly Extremities: no clubbing, cyanosis or edema; 2+ radial, ulnar and brachial pulses bilaterally; 2+ right femoral, posterior tibial and dorsalis pedis pulses; 2+ left femoral, posterior tibial and dorsalis pedis pulses; no subclavian or femoral bruits Neurological: grossly nonfocal  LABS  CBC  Recent Labs  11/23/14 0532 11/24/14 0352  WBC 12.0* 13.6*  HGB 12.3* 11.7*  HCT 36.4* 35.3*  MCV 93.6 94.9  PLT 139* 008*   Basic Metabolic Panel  Recent Labs  11/22/14 0511 11/23/14 0532 11/24/14 0352  NA 133* 135 137  K 3.7 4.1 4.0  CL 97* 96* 98*  CO2 24 24 29   GLUCOSE 54* 52* 50*  BUN 46* 57* 29*  CREATININE 3.97* 4.68* 3.20*  CALCIUM 7.6* 7.9* 7.7*  PHOS 4.1 4.5  --    Liver Function Tests  Recent Labs  11/23/14 0532 11/24/14 0352  AST 127* 107*  ALT 169* 136*  ALKPHOS 209* 208*  BILITOT 1.5* 1.5*  PROT 6.3* 5.7*  ALBUMIN 3.2*  3.2* 2.9*   No results for input(s): LIPASE, AMYLASE in the last 72 hours. Cardiac Enzymes  Recent Labs  11/23/14 1615 11/23/14 2112 11/24/14 0352  TROPONINI 0.52* 0.53* 0.51*   BNP Invalid input(s): POCBNP D-Dimer No results for input(s): DDIMER in the last 72 hours. Hemoglobin A1C No results for input(s): HGBA1C in the last 72 hours. Fasting Lipid Panel No  results for input(s): CHOL, HDL, LDLCALC, TRIG, CHOLHDL, LDLDIRECT in the last 72 hours. Thyroid Function Tests  Recent Labs  11/21/14 1347  TSH 1.386    Radiology Studies Imaging results have been reviewed and No results found.  TELE atrial fibrillation, rate 90-100  ECG atrial fibrillation, LVH  ASSESSMENT AND PLAN  Rate control is fair, probably appropriate considering his acute illness. Resume warfarin and DC IV heparin once INR>2. Please reconsult if needed for new questions or developments.   Sanda Klein, MD, [facility] [phone] office [phone] pager 11/24/2014 8:28 AM

## 2014-11-24 NOTE — Progress Notes (Signed)
ANTICOAGULATION CONSULT NOTE - Follow Up Consult  Pharmacy Consult for Heparin and coumadin Indication: atrial fibrillation  Allergies  Allergen Reactions  . Dilaudid [Hydromorphone] Nausea And Vomiting    Patient Measurements: Height: 6' (182.9 cm) Weight: 161 lb 13.1 oz (73.4 kg) (stood to scale ) IBW/kg (Calculated) : 77.6  Vital Signs: Temp: 98.6 F (37 C) (08/06 0436) Temp Source: Oral (08/06 0436) BP: 110/68 mmHg (08/06 0436) Pulse Rate: 101 (08/06 0436)  Labs:  Recent Labs  11/22/14 0511 11/22/14 1932 11/23/14 11/23/14 0532 11/23/14 1615 11/23/14 2112 11/24/14 0352 11/24/14 1012  HGB 11.3*  --   --  12.3*  --   --  11.7*  --   HCT 34.2*  --   --  36.4*  --   --  35.3*  --   PLT 142*  --   --  139*  --   --  136*  --   LABPROT 17.8*  --   --  16.4*  --   --  16.3*  --   INR 1.45  --   --  1.31  --   --  1.29  --   HEPARINUNFRC  --  2.10* 0.52  --   --   --   --  0.19*  CREATININE 3.97*  --   --  4.68*  --   --  3.20*  --   TROPONINI  --   --   --   --  0.52* 0.53* 0.51*  --     Estimated Creatinine Clearance: 25.8 mL/min (by C-G formula based on Cr of 3.2).  Assessment: 59 year old male admitted 11/18/2014  With altered mental status. On warfarin PTA for afib and supratherapeutic INR on admission 3.64>6.53. Given Vit K 5 mg x1 and INR decreased to 1.31. S/p LP - Heparin resumed 8/5 @1800 , Coumadin resumed 8/6 HL 0.19 (subtherapeutic), INR 1.29, H/H stable, no s/sx of bleeding noted   Goal of Therapy:  INR 2-3 Heparin level 0.3-0.7 units/ml Monitor platelets by anticoagulation protocol: Yes   Plan:  - Increase heparin to 1400 units / hr - Will give warfarin 5 mg x1 tonight - F/u 2130 HL - Daily heparin level, CBC - Monitor s/sx of bleeding  Dimitri Ped, PharmD. Clinical Pharmacist Resident Pager: [phone]

## 2014-11-24 NOTE — Progress Notes (Signed)
Subjective:   Says he had a rough night because of confusion, sister at bedside this AM Says he is in a hotel, year 2012. Knows where he goes for HD 'on [address]' Recognized me which seems like an improvement.  Wife is concerned that he contracted this infection or whatever it is at OP dialysis  Objective Filed Vitals:   11/23/14 1609 11/23/14 2013 11/24/14 0005 11/24/14 0436  BP: 120/76 140/83 130/69 110/68  Pulse: 108 103 80 101  Temp: 98.8 F (37.1 C) 98.5 F (36.9 C) 98.7 F (37.1 C) 98.6 F (37 C)  TempSrc: Oral Oral Oral Oral  Resp: 14 18 18 18   Height:      Weight:      SpO2: 97% 94% 95% 98%   Physical Exam General: alert, oriented to person. Recognizes he is still confused. Very pleasant Heart: irreg Lungs: CTA, unlabored Abdomen: soft, nontender +BS Extremities: no Edema Dialysis Access: L AVG +b/t  Dialysis Orders: [facility] 180 EDW 80 3 K 2.5 Ca 450/800 profile 2 AVGG heparin 6100 no Fe or ESA calcitriol 1.75 Recent labs; 7/27 Hgb 11.6 WBC ^ at 12.9 89% N ferritin 1122 42% sat Ca/P ok iPTH 427 stable hgb A1C 10.7 up from 6.1 in April and 7.4 in January   Assessment/Plan: 1. DKA- Resolved. Back on sq insulin-  2. C. Diff colitis- on appropriate abx. CDiff negative as of 8/1 (+ 7/19)  3. AMS - seizure activity on EEG. 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 On anticonvulsants, acyclovir and solumedrol and pending LP- no growth < 24 hrs 4. Seizures - IV dilantin  5. ESRD- continue with HD MWF via AVG- next HD Monday.  I tried to reassure wife that OP dialysis did not "give" him this but I dont think she was convinced 6. Enterococcal UTI- on no prolonged rx at this time. Rec'd 1 dose fosfomycin on 8/2 7. Anemia of chronic disease- currently ESA on hold due to Hgb 11.7- cont to watch CBC 8. CKD-MBD: Cont with binders (phos in range now) and calcitriol with HD 9. Abnormal LFT's-? Etiology. Trending down. No abd pain. [place] 11/06/14-  mild nodular liver contour w mall volume of ascitis. Possibility of cirrhosis not excluded.Ammonia level 44 on 8/2 10. Nutrition:per primary 11. Hypertension: stable 12. A fib- rate controlled, on IV heparin/coumadin to restart tonight- per pharmacy  [name], NP [place] [phone] 11/24/2014,12:25 PM  LOS: 6 days    Patient seen and examined, agree with above note with above modifications. Seems to be improving from a MS standpoint.  Next HD planned for Monday Corliss Parish, MD 11/24/2014      Additional Objective Labs: Basic Metabolic Panel:  Recent Labs Lab 11/21/14 0800  11/22/14 0511 11/23/14 0532 11/24/14 0352  NA  --   < > 133* 135 137  K  --   < > 3.7 4.1 4.0  CL  --   < > 97* 96* 98*  CO2  --   < > 24 24 29   GLUCOSE  --   < > 54* 52* 50*  BUN  --   < > 46* 57* 29*  CREATININE  --   < > 3.97* 4.68* 3.20*  CALCIUM  --   < > 7.6* 7.9* 7.7*  PHOS 6.1*  --  4.1 4.5  --   < > = values in this interval not displayed. Liver Function Tests:  Recent Labs Lab 11/20/14 1102  11/22/14 0511 11/23/14 0532 11/24/14 0352  AST 161*  --   --  127* 107*  ALT 230*  --   --  169* 136*  ALKPHOS 180*  --   --  209* 208*  BILITOT 1.5*  --   --  1.5* 1.5*  PROT 6.0*  --   --  6.3* 5.7*  ALBUMIN 3.3*  < > 2.9* 3.2*  3.2* 2.9*  < > = values in this interval not displayed.  Recent Labs Lab 11/18/14 0422  LIPASE 97*   CBC:  Recent Labs Lab 11/20/14 0605 11/21/14 0830 11/22/14 0511 11/23/14 0532 11/24/14 0352  WBC 16.3* 15.6* 13.9* 12.0* 13.6*  HGB 12.7* 11.9* 11.3* 12.3* 11.7*  HCT 37.9* 34.3* 34.2* 36.4* 35.3*  MCV 94.5 90.7 92.4 93.6 94.9  PLT 193 178 142* 139* 136*   Blood Culture    Component Value Date/Time   SDES CSF 11/23/2014 1630   SPECREQUEST NONE 11/23/2014 1630   CULT NO GROWTH < 24 HOURS 11/23/2014 1630   REPTSTATUS PENDING 11/23/2014 1630    Cardiac Enzymes:  Recent Labs Lab 11/23/14 1615 11/23/14 2112  11/24/14 0352  TROPONINI 0.52* 0.53* 0.51*   CBG:  Recent Labs Lab 11/23/14 1241 11/23/14 1651 11/23/14 2100 11/24/14 0745 11/24/14 1210  GLUCAP 74 124* 109* 138* 317*   Iron Studies: No results for input(s): IRON, TIBC, TRANSFERRIN, FERRITIN in the last 72 hours. @lablastinr3 @ Studies/Results: No results found. Medications: . sodium chloride 10 mL/hr at 11/18/14 2300  . heparin 1,400 Units/hr (11/24/14 1147)  . insulin (NOVOLIN-R) infusion Stopped (11/18/14 1600)   . acyclovir  400 mg Intravenous Q24H  . antiseptic oral rinse  7 mL Mouth Rinse 6 times per day  . calcitRIOL  1.75 mcg Oral Q M,W,F-HD  . clobetasol cream  1 application Topical BID  . colchicine  0.3 mg Oral Once per day on Mon Thu  . diltiazem  30 mg Oral 4 times per day  . feeding supplement (NEPRO CARB STEADY)  237 mL Oral BID BM  . insulin aspart  0-9 Units Subcutaneous TID WC  . insulin glargine  20 Units Subcutaneous Daily  . LORazepam  1 mg Intravenous Once  . methylPREDNISolone (SOLU-MEDROL) injection  1,000 mg Intravenous Daily  . metoprolol  50 mg Oral BID  . multivitamin  1 tablet Oral QHS  . pantoprazole  20 mg Oral BID  . phenytoin (DILANTIN) IV  100 mg Intravenous 3 times per day  . saccharomyces boulardii  250 mg Oral BID  . sevelamer carbonate  1,600 mg Oral TID WC  . vancomycin  500 mg Oral 4 times per day  . warfarin  5 mg Oral ONCE-1800  . Warfarin - Pharmacist Dosing Inpatient   Does not apply [phone]

## 2014-11-24 NOTE — Progress Notes (Addendum)
PATIENT DETAILS Name: Joseph [name] Age: 59 y.o. Sex: male Date of Birth: [birth year] Admit Date: 11/18/2014 Admitting Physician Theodis Blaze, MD [id number], REGINA, NP  Brief narrative:  59 year old male with history of end-stage renal disease on hemodialysis, recent hospitalization and discharge on 7/22 for C. difficile colitis, presented on 7/31 with altered mental status, diabetic ketoacidosis, and significant leukocytosis. Initially, altered mental status was felt to be from toxic metabolic encephalopathy setting of C. difficile with sepsis and DKA. 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 Neurology was subsequently consulted, underwent lumbar puncture on 8/5-CSF did not show any major abnormalities, neurology now has started the patient on high-dose steroids. See below for further details  Subjective: Seems less confused today  Assessment/Plan: Principal Problem: Acute encephalopathy:Initially suspected to be from toxic metabolic encephalopathy from mild DKA, C. difficile colitis. Since continued to have confusion inspite of improvement in blood sugars and C. 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 Subsequently started on empiric Acylovir and Dilantin. Neurology performed LP planned 8/5-CSF did not show major abnormalities except for elevated protein. Although remains on empiric Acyclovir pending CSF HSV PCR-since felt less likely to have infection-Neurology has started patient on high dose steroids.Remains confused-Plans are to follow clinical course. Please follow CSF serology-CMV/HSV/Lyme studies pending. CSF NMDA Ab and paraneoplastic panel still pending as well.    Active Problems: C. difficile colitis with sepsis: diarrhea improved, continue with oral vancomycin-suspect 10 day course from 7/31 would be  suffice.   Atrial fibrillation with RVR: heart rate much better after adjustment of meds-continue cardizem and metoprolol.  Chads2vasc score of 2, on coumadin/Heparin gtt per pharmacy. Cards following  Mildly elevated Troponin:secondary to demand ischemia/RVR/CKD. No further recommendations from cards.   DKA: Resolved.Transition to subcutaneous insulin. See below   Elevated LFT's:?etiology-suspect shock liver from sepsis.USG abd negative, Hepatitis serology neg.Follow-LFT's downtrending  Type 2 diabetes: Complicated by development of hypoglycemic episodes, CBG's now stable with Lantus 20 units. Continue with SSI. Follow closely  End-stage renal disease: On hemodialysis-Monday, Wednesday and Friday. Nephrology following  Gout: Continue with colcichine  Chronic skin rash:Dr Candiss Norse Discussed his case with his dermatologist Dr. Allyson Sabal who describes this as nonspecific dermatitis. Supportive care only.  Enterococcal UTI: with Dilirium given1 dose of Fosfomycin on 8/2  Disposition: Remain inpatient-home when mental status improves  Antimicrobial agents  See below  Anti-infectives    Start     Dose/Rate Route Frequency Ordered Stop   11/22/14 1015  acyclovir (ZOVIRAX) 800 mg in dextrose 5 % 150 mL IVPB  Status:  Discontinued     800 mg 166 mL/hr over 60 Minutes Intravenous Every 12 hours 11/22/14 1001 11/22/14 1009   11/22/14 1015  acyclovir (ZOVIRAX) 400 mg in dextrose 5 % 100 mL IVPB     400 mg 108 mL/hr over 60 Minutes Intravenous Every 24 hours 11/22/14 1010     11/22/14 1000  acyclovir (ZOVIRAX) 200 MG capsule 200 mg  Status:  Discontinued     200 mg Oral 3 times daily 11/22/14 0826 11/22/14 1000   11/19/14 1400  vancomycin (VANCOCIN) 50 mg/mL oral solution 500 mg     500 mg Oral 4 times per day 11/19/14 1001 11/25/14 1359   11/18/14 1400  vancomycin (VANCOCIN) 125 MG capsule 250 mg  Status:  Discontinued  250 mg Oral 4 times daily 11/18/14 1128 11/18/14 1248   11/18/14 1400   vancomycin (VANCOCIN) 50 mg/mL oral solution 125 mg  Status:  Discontinued     125 mg Oral 4 times per day 11/18/14 1248 11/18/14 1320   11/18/14 1400  vancomycin (VANCOCIN) 50 mg/mL oral solution 250 mg  Status:  Discontinued     250 mg Oral 4 times per day 11/18/14 1320 11/19/14 1001      DVT Prophylaxis: Coumadin/Heparin gtt  Code Status: Full code   Family Communication Spouse over the phone on 8/3  Procedures: None  CONSULTS:  nephrology and neurology  Time spent 35 minutes-Greater than 50% of this time was spent in counseling, explanation of diagnosis, planning of further management, and coordination of care.  MEDICATIONS: Scheduled Meds: . acyclovir  400 mg Intravenous Q24H  . antiseptic oral rinse  7 mL Mouth Rinse 6 times per day  . calcitRIOL  1.75 mcg Oral Q M,W,F-HD  . clobetasol cream  1 application Topical BID  . colchicine  0.3 mg Oral Once per day on Mon Thu  . diltiazem  30 mg Oral 4 times per day  . feeding supplement (NEPRO CARB STEADY)  237 mL Oral BID BM  . insulin aspart  0-9 Units Subcutaneous TID WC  . insulin glargine  20 Units Subcutaneous Daily  . LORazepam  1 mg Intravenous Once  . methylPREDNISolone (SOLU-MEDROL) injection  1,000 mg Intravenous Daily  . metoprolol  50 mg Oral BID  . multivitamin  1 tablet Oral QHS  . pantoprazole  20 mg Oral BID  . phenytoin (DILANTIN) IV  100 mg Intravenous 3 times per day  . saccharomyces boulardii  250 mg Oral BID  . sevelamer carbonate  1,600 mg Oral TID WC  . vancomycin  500 mg Oral 4 times per day  . warfarin  5 mg Oral ONCE-1800  . Warfarin - Pharmacist Dosing Inpatient   Does not apply q1800   Continuous Infusions: . sodium chloride 10 mL/hr at 11/18/14 2300  . heparin 1,400 Units/hr (11/24/14 1147)  . insulin (NOVOLIN-R) infusion Stopped (11/18/14 1600)   PRN Meds:.sodium chloride, sodium chloride, camphor-menthol, diphenhydrAMINE, feeding supplement (NEPRO CARB STEADY), heparin, heparin,  lidocaine (PF), lidocaine-prilocaine, metoprolol, pentafluoroprop-tetrafluoroeth    PHYSICAL EXAM: Vital signs in last 24 hours: Filed Vitals:   11/23/14 2013 11/24/14 0005 11/24/14 0436 11/24/14 1346  BP: 140/83 130/69 110/68 126/80  Pulse: 103 80 101 51  Temp: 98.5 F (36.9 C) 98.7 F (37.1 C) 98.6 F (37 C) 98.8 F (37.1 C)  TempSrc: Oral Oral Oral Oral  Resp: 18 18 18 20   Height:      Weight:      SpO2: 94% 95% 98% 95%    Weight change: -7.2 kg (-15 lb 14 oz) Filed Weights   11/21/14 1229 11/23/14 0439 11/23/14 0743  Weight: 79.6 kg (175 lb 7.8 oz) 80.6 kg (177 lb 11.1 oz) 73.4 kg (161 lb 13.1 oz)   Body mass index is 21.94 kg/(m^2).   Gen Exam: Awake/alert-answers some questions appropriately today Neck: Supple, No JVD.   Chest: B/L Clear.   CVS: S1 S2 Regular, no murmurs.  Abdomen: soft, BS +, non tender, non distended.  Extremities: no edema, lower extremities warm to touch. Neurologic: Non Focal.  Skin:papular dry rash all over mostly on the back Wounds: N/A.    Intake/Output from previous day:  Intake/Output Summary (Last 24 hours) at 11/24/14 1639 Last data filed at 11/24/14 [phone]  Gross per 24 hour  Intake    788 ml  Output    400 ml  Net    388 ml     LAB RESULTS: CBC  Recent Labs Lab 11/20/14 0605 11/21/14 0830 11/22/14 0511 11/23/14 0532 11/24/14 0352  WBC 16.3* 15.6* 13.9* 12.0* 13.6*  HGB 12.7* 11.9* 11.3* 12.3* 11.7*  HCT 37.9* 34.3* 34.2* 36.4* 35.3*  PLT 193 178 142* 139* 136*  MCV 94.5 90.7 92.4 93.6 94.9  MCH 31.7 31.5 30.5 31.6 31.5  MCHC 33.5 34.7 33.0 33.8 33.1  RDW 17.7* 17.3* 17.3* 17.5* 17.7*    Chemistries   Recent Labs Lab 11/20/14 1102 11/21/14 0830 11/22/14 0511 11/23/14 0532 11/24/14 0352  NA 132* 134* 133* 135 137  K 4.7 4.0 3.7 4.1 4.0  CL 97* 99* 97* 96* 98*  CO2 21* 23 24 24 29   GLUCOSE 307* 60* 54* 52* 50*  BUN 72* 88* 46* 57* 29*  CREATININE 5.62* 5.89* 3.97* 4.68* 3.20*  CALCIUM 8.0* 8.0* 7.6*  7.9* 7.7*    CBG:  Recent Labs Lab 11/23/14 1241 11/23/14 1651 11/23/14 2100 11/24/14 0745 11/24/14 1210  GLUCAP 74 124* 109* 138* 317*    GFR Estimated Creatinine Clearance: 25.8 mL/min (by C-G formula based on Cr of 3.2).  Coagulation profile  Recent Labs Lab 11/21/14 0628 11/21/14 0830 11/22/14 0511 11/23/14 0532 11/24/14 0352  INR >10.00* 1.66* 1.45 1.31 1.29    Cardiac Enzymes  Recent Labs Lab 11/23/14 1615 11/23/14 2112 11/24/14 0352  TROPONINI 0.52* 0.53* 0.51*    Invalid input(s): POCBNP No results for input(s): DDIMER in the last 72 hours. No results for input(s): HGBA1C in the last 72 hours. No results for input(s): CHOL, HDL, LDLCALC, TRIG, CHOLHDL, LDLDIRECT in the last 72 hours. No results for input(s): TSH, T4TOTAL, T3FREE, THYROIDAB in the last 72 hours.  Invalid input(s): FREET3 No results for input(s): VITAMINB12, FOLATE, FERRITIN, TIBC, IRON, RETICCTPCT in the last 72 hours. No results for input(s): LIPASE, AMYLASE in the last 72 hours.  Urine Studies No results for input(s): UHGB, CRYS in the last 72 hours.  Invalid input(s): UACOL, UAPR, USPG, UPH, UTP, UGL, UKET, UBIL, UNIT, UROB, ULEU, UEPI, UWBC, URBC, UBAC, CAST, UCOM, BILUA  MICROBIOLOGY: Recent Results (from the past 240 hour(s))  Blood culture (routine x 2)     Status: None   Collection Time: 11/18/14  6:30 AM  Result Value Ref Range Status   Specimen Description BLOOD LEFT ARM  Final   Special Requests BOTTLES DRAWN AEROBIC AND ANAEROBIC 5CC  Final   Culture NO GROWTH 5 DAYS  Final   Report Status 11/23/2014 FINAL  Final  Blood culture (routine x 2)     Status: None   Collection Time: 11/18/14  6:30 AM  Result Value Ref Range Status   Specimen Description BLOOD LEFT HAND  Final   Special Requests BOTTLES DRAWN AEROBIC AND ANAEROBIC 5CC  Final   Culture NO GROWTH 5 DAYS  Final   Report Status 11/23/2014 FINAL  Final  Urine culture     Status: None   Collection Time:  11/18/14 11:30 AM  Result Value Ref Range Status   Specimen Description URINE, RANDOM  Final   Special Requests NONE  Final   Culture   Final    >=100,000 COLONIES/mL ENTEROCOCCUS SPECIES 30,000 COLONIES/mL ESCHERICHIA COLI    Report Status 11/21/2014 FINAL  Final   Organism ID, Bacteria ENTEROCOCCUS SPECIES  Final   Organism ID, Bacteria ESCHERICHIA COLI  Final      Susceptibility   Escherichia coli - MIC*    AMPICILLIN >=32 RESISTANT Resistant     CEFAZOLIN >=64 RESISTANT Resistant     CEFTRIAXONE <=1 SENSITIVE Sensitive     CIPROFLOXACIN <=0.25 SENSITIVE Sensitive     GENTAMICIN <=1 SENSITIVE Sensitive     IMIPENEM <=0.25 SENSITIVE Sensitive     NITROFURANTOIN <=16 SENSITIVE Sensitive     TRIMETH/SULFA <=20 SENSITIVE Sensitive     AMPICILLIN/SULBACTAM 8 SENSITIVE Sensitive     PIP/TAZO <=4 SENSITIVE Sensitive     * 30,000 COLONIES/mL ESCHERICHIA COLI   Enterococcus species - MIC*    AMPICILLIN <=2 SENSITIVE Sensitive     LEVOFLOXACIN 1 SENSITIVE Sensitive     NITROFURANTOIN <=16 SENSITIVE Sensitive     VANCOMYCIN 2 SENSITIVE Sensitive     LINEZOLID 1 SENSITIVE Sensitive     * >=100,000 COLONIES/mL ENTEROCOCCUS SPECIES  MRSA PCR Screening     Status: None   Collection Time: 11/18/14 11:30 AM  Result Value Ref Range Status   MRSA by PCR NEGATIVE NEGATIVE Final    Comment:        The GeneXpert MRSA Assay (FDA approved for NASAL specimens only), is one component of a comprehensive MRSA colonization surveillance program. It is not intended to diagnose MRSA infection nor to guide or monitor treatment for MRSA infections.   Clostridium Difficile by PCR (not at [facility])     Status: None   Collection Time: 11/19/14  3:50 PM  Result Value Ref Range Status   Toxigenic C Difficile by pcr NEGATIVE NEGATIVE Final  CSF culture with Stat gram stain     Status: None (Preliminary result)   Collection Time: 11/23/14  4:30 PM  Result Value Ref Range Status   Specimen Description CSF   Final   Special Requests NONE  Final   Gram Stain   Final    WBC PRESENT, PREDOMINANTLY MONONUCLEAR NO ORGANISMS SEEN CYTOSPIN    Culture NO GROWTH < 24 HOURS  Final   Report Status PENDING  Incomplete    RADIOLOGY STUDIES/RESULTS: Dg Chest 2 View  11/18/2014   CLINICAL DATA:  Altered mental status. Shortness of breath and nausea today.  EXAM: CHEST  2 VIEW  COMPARISON:  11/05/2014  FINDINGS: Chronic cardiomegaly and vascular congestion, unchanged. Diminished right pleural effusion and basilar opacity from prior exam. No new confluent airspace disease or pneumothorax. No acute osseous abnormalities.  IMPRESSION: 1. Diminished right pleural effusion and right basilar opacity. 2. Stable chronic cardiomegaly and vascular congestion.   Electronically Signed   By: Jeb Levering M.D.   On: 11/18/2014 06:21   Dg Chest 2 View  11/05/2014   CLINICAL DATA:  Dyspnea for 1 day  EXAM: CHEST  2 VIEW  COMPARISON:  July 03, 2014  FINDINGS: The heart size and mediastinal contours are stable. There is mild patchy consolidation of lateral right lung base with small right pleural effusion. There is mild chronic central pulmonary vascular congestion. The visualized skeletal structures are stable.  IMPRESSION: Chronic central pulmonary vascular congestion.  Small right pleural effusion with associated mild patchy opacity of right lung base which could be due to atelectasis but superimposed pneumonia is not excluded.   Electronically Signed   By: Abelardo Diesel M.D.   On: 11/05/2014 21:54   Ct Head Wo Contrast  11/18/2014   CLINICAL DATA:  Altered mental status.  EXAM: CT HEAD WITHOUT CONTRAST  TECHNIQUE: Contiguous axial images were obtained from the base  of the skull through the vertex without intravenous contrast.  COMPARISON:  None.  FINDINGS: No intracranial hemorrhage, mass effect, or midline shift. Small focal encephalomalacia in the left occipital lobe. No hydrocephalus. The basilar cisterns are patent. No  evidence of territorial infarct. No intracranial fluid collection. Calvarium is intact. Included paranasal sinuses and mastoid air cells are well aerated.  IMPRESSION: 1.  No acute intracranial abnormality. 2. Small focal encephalomalacia in the left occipital lobe.   Electronically Signed   By: Jeb Levering M.D.   On: 11/18/2014 06:13   Mr Brain Wo Contrast  11/21/2014   CLINICAL DATA:  Altered mental status. Encephalopathy. Atrial fibrillation.  EXAM: MRI HEAD WITHOUT CONTRAST  TECHNIQUE: Multiplanar, multiecho pulse sequences of the brain and surrounding structures were obtained without intravenous contrast.  COMPARISON:  CT head without contrast from the same day.  FINDINGS: Restricted diffusion and increased T2 signal is present within the medial temporal lobes and hippocampal structures bilaterally. Inferior temporal lobes are intact.  Moderate generalized atrophy and diffuse periventricular T2 changes bilaterally are advanced for age. The ventricles are of normal size.  Flow is present in the major intracranial arteries. The globes and orbits are intact. The paranasal sinuses are clear. There is some fluid in the right mastoid air cells. No obstructing nasopharyngeal lesion is evident.  IMPRESSION: Abnormal T2 signal and restricted diffusion within the medial temporal lobes and hippocampus bilaterally. 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 There is no hemorrhage or signal change along the undersurface the temporal lobes, but herpes encephalitis is also considered. Status epilepticus is also on the differential diagnosis.  Atrophy in T2 signal changes are advanced for age. This is nonspecific, but likely reflects the sequela of chronic microvascular ischemia.  These results will be called to the ordering clinician or representative by the Radiologist Assistant, and communication documented in the PACS or zVision Dashboard.   Electronically Signed   By: San Morelle M.D.   On:  11/21/2014 19:01   US Abdomen Complete  11/06/2014   CLINICAL DATA:  Abnormal liver function tests  EXAM: ULTRASOUND ABDOMEN COMPLETE  COMPARISON:  None.  FINDINGS: Gallbladder: No gallstones or wall thickening visualized. No sonographic Murphy sign noted.  Common bile duct: Diameter: 4 mm  Liver: Minimally nodular contour with no focal abnormalities.  IVC: No abnormality visualized.  Pancreas: Visualized portion unremarkable.  Spleen: Size and appearance within normal limits.  Right Kidney: Length: 10.7 cm. Midpole cyst measuring 14 mm. Mildly increased echogenicity.  Left Kidney: Length: 10.8 cm. Lower pole cyst measures 1 cm. Mildly increased echogenicity.  Abdominal aorta: No aneurysm identified. Limited visualization distally due to bowel gas.  Other findings: Right pleural effusion.  Small volume ascites.  IMPRESSION: Mildly nodular liver contour with small volume of ascites and right pleural effusion. Possibility of cirrhosis not excluded.  Evidence of medical renal disease.   Electronically Signed   By: Skipper Cliche M.D.   On: 11/06/2014 21:38    Oren Binet, MD  [facility] Pager:[phone]  If 7PM-7AM, please contact night-coverage [email or web address] Password [email or web address] 11/24/2014, 4:39 PM   LOS: 6 days

## 2014-11-25 LAB — COMPREHENSIVE METABOLIC PANEL
ALK PHOS: 191 U/L — AB (ref 38–126)
ALT: 119 U/L — ABNORMAL HIGH (ref 17–63)
AST: 86 U/L — ABNORMAL HIGH (ref 15–41)
Albumin: 2.8 g/dL — ABNORMAL LOW (ref 3.5–5.0)
Anion gap: 9 (ref 5–15)
BUN: 56 mg/dL — ABNORMAL HIGH (ref 6–20)
CO2: 26 mmol/L (ref 22–32)
Calcium: 7.7 mg/dL — ABNORMAL LOW (ref 8.9–10.3)
Chloride: 91 mmol/L — ABNORMAL LOW (ref 101–111)
Creatinine, Ser: 4.4 mg/dL — ABNORMAL HIGH (ref 0.61–1.24)
GFR calc Af Amer: 16 mL/min — ABNORMAL LOW (ref >60)
GFR calc non Af Amer: 13 mL/min — ABNORMAL LOW (ref >60)
Glucose, Bld: 498 mg/dL — ABNORMAL HIGH (ref 65–99)
Potassium: 4.5 mmol/L (ref 3.5–5.1)
Sodium: 126 mmol/L — ABNORMAL LOW (ref 135–145)
Total Bilirubin: 1.2 mg/dL (ref 0.3–1.2)
Total Protein: 5.5 g/dL — ABNORMAL LOW (ref 6.5–8.1)

## 2014-11-25 LAB — CBC
HCT: 32.5 % — ABNORMAL LOW (ref 39.0–52.0)
Hemoglobin: 10.8 g/dL — ABNORMAL LOW (ref 13.0–17.0)
MCH: 31.2 pg (ref 26.0–34.0)
MCHC: 33.2 g/dL (ref 30.0–36.0)
MCV: 93.9 fL (ref 78.0–100.0)
Platelets: 111 10*3/uL — ABNORMAL LOW (ref 150–400)
RBC: 3.46 MIL/uL — ABNORMAL LOW (ref 4.22–5.81)
RDW: 17.7 % — ABNORMAL HIGH (ref 11.5–15.5)
WBC: 12.6 10*3/uL — ABNORMAL HIGH (ref 4.0–10.5)

## 2014-11-25 LAB — HEPARIN LEVEL (UNFRACTIONATED)
HEPARIN UNFRACTIONATED: 2 [IU]/mL — AB (ref 0.30–0.70)
Heparin Unfractionated: 0.81 IU/mL — ABNORMAL HIGH (ref 0.30–0.70)

## 2014-11-25 LAB — PROTIME-INR
INR: 1.38 (ref 0.00–1.49)
Prothrombin Time: 17.1 seconds — ABNORMAL HIGH (ref 11.6–15.2)

## 2014-11-25 LAB — GLUCOSE, CAPILLARY
GLUCOSE-CAPILLARY: 460 mg/dL — AB (ref 65–99)
GLUCOSE-CAPILLARY: 236 mg/dL — AB (ref 65–99)
GLUCOSE-CAPILLARY: 184 mg/dL — AB (ref 65–99)
GLUCOSE-CAPILLARY: 312 mg/dL — AB (ref 65–99)
Glucose-Capillary: 440 mg/dL — ABNORMAL HIGH (ref 65–99)
Glucose-Capillary: 490 mg/dL — ABNORMAL HIGH (ref 65–99)
Glucose-Capillary: 420 mg/dL — ABNORMAL HIGH (ref 65–99)
Glucose-Capillary: 418 mg/dL — ABNORMAL HIGH (ref 65–99)
Glucose-Capillary: 396 mg/dL — ABNORMAL HIGH (ref 65–99)
Glucose-Capillary: 297 mg/dL — ABNORMAL HIGH (ref 65–99)
Glucose-Capillary: 143 mg/dL — ABNORMAL HIGH (ref 65–99)
Glucose-Capillary: 113 mg/dL — ABNORMAL HIGH (ref 65–99)
Glucose-Capillary: 103 mg/dL — ABNORMAL HIGH (ref 65–99)
Glucose-Capillary: 309 mg/dL — ABNORMAL HIGH (ref 65–99)
Glucose-Capillary: 251 mg/dL — ABNORMAL HIGH (ref 65–99)

## 2014-11-25 MED ORDER — WARFARIN SODIUM 7.5 MG PO TABS
7.5000 mg | ORAL_TABLET | Freq: Once | ORAL | Status: AC
  Administered 2014-11-25: 7.5 mg via ORAL
  Filled 2014-11-25: qty 1

## 2014-11-25 MED ORDER — CLOBETASOL PROPIONATE 0.05 % EX CREA
1.0000 "application " | TOPICAL_CREAM | Freq: Three times a day (TID) | CUTANEOUS | Status: DC
  Administered 2014-11-25 – 2014-12-02 (×20): 1 via TOPICAL
  Filled 2014-11-25 (×4): qty 15

## 2014-11-25 MED ORDER — HEPARIN (PORCINE) IN NACL 100-0.45 UNIT/ML-% IJ SOLN
1250.0000 [IU]/h | INTRAMUSCULAR | Status: DC
  Administered 2014-11-25: 1100 [IU]/h via INTRAVENOUS
  Administered 2014-11-26: 1250 [IU]/h via INTRAVENOUS
  Filled 2014-11-25 (×2): qty 250

## 2014-11-25 MED ORDER — INSULIN REGULAR BOLUS VIA INFUSION
0.0000 [IU] | Freq: Three times a day (TID) | INTRAVENOUS | Status: DC
  Administered 2014-11-25: 6.6 [IU] via INTRAVENOUS
  Filled 2014-11-25: qty 10

## 2014-11-25 MED ORDER — DEXTROSE 50 % IV SOLN: 25.0000 mL | INTRAVENOUS | Status: DC | PRN

## 2014-11-25 MED ORDER — SODIUM CHLORIDE 0.9 % IV SOLN
INTRAVENOUS | Status: DC
  Administered 2014-11-25: 3.8 [IU]/h via INTRAVENOUS
  Administered 2014-11-25: 10.7 [IU]/h via INTRAVENOUS
  Administered 2014-11-25: 13.4 [IU]/h via INTRAVENOUS
  Administered 2014-11-25: 8.8 [IU]/h via INTRAVENOUS
  Administered 2014-11-25: 9.7 [IU]/h via INTRAVENOUS
  Administered 2014-11-25: 2.1 [IU]/h via INTRAVENOUS
  Administered 2014-11-25: 7.2 [IU]/h via INTRAVENOUS
  Administered 2014-11-25: 4.2 [IU]/h via INTRAVENOUS
  Filled 2014-11-25: qty 2.5

## 2014-11-25 MED ORDER — HYDROXYZINE HCL 10 MG/5ML PO SYRP
10.0000 mg | ORAL_SOLUTION | Freq: Three times a day (TID) | ORAL | Status: DC | PRN
  Administered 2014-11-26 – 2014-12-01 (×5): 10 mg via ORAL
  Filled 2014-11-25 (×9): qty 5

## 2014-11-25 MED ORDER — SODIUM CHLORIDE 0.9 % IV SOLN
INTRAVENOUS | Status: DC
  Administered 2014-11-25 – 2014-11-27 (×3): via INTRAVENOUS

## 2014-11-25 NOTE — Progress Notes (Signed)
PATIENT DETAILS Name: Joseph [name] Age: 59 y.o. Sex: male Date of Birth: Jul 15, 1955 Admit Date: 11/18/2014 Admitting Physician Theodis Blaze, MD CXK:GYJEH, REGINA, NP  Brief narrative:  59 year old male with history of end-stage renal disease on hemodialysis, recent hospitalization and discharge on 7/22 for C. difficile colitis, presented on 7/31 with altered mental status, diabetic ketoacidosis, and significant leukocytosis. Initially, altered mental status was felt to be from toxic metabolic encephalopathy setting of C. difficile with sepsis and DKA. 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 Neurology was subsequently consulted, underwent lumbar puncture on 8/5-CSF did not show any major abnormalities, neurology now has started the patient on high-dose steroids. See below for further details  Subjective:  Patient in bed, denies headache, no chest abdominal pain. Mildly confused. No focal weakness.  Assessment/Plan:   Acute encephalopathy:Initially suspected to be from toxic metabolic encephalopathy from mild DKA, C. difficile colitis. Since continued to have confusion inspite of improvement in blood sugars and C. 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   Seen by neurology underwent LP which was unimpressive. Initially given a trial of acyclovir which was stopped by neurology.   Currently on Dilantin for his seizures along with high-dose IV Solu-Medrol per neurology.   Pending follow CSF serology-CMV/HSV/Lyme studies pending. CSF NMDA Ab and paraneoplastic panel still pending as well.      C. difficile colitis with sepsis: diarrhea improved, continue with oral vancomycin-suspect 10 day course from 7/31 would be suffice.   Atrial fibrillation with RVR:  heart rate much better after adjustment of meds-continue cardizem and metoprolol.  Chads2vasc score of 2, on coumadin/Heparin gtt per pharmacy. Cards following.  Lab Results  Component Value Date   INR 1.38 11/25/2014   INR 1.29 11/24/2014   INR 1.31 11/23/2014     Mildly elevated Troponin:secondary to demand ischemia/RVR/CKD. No further recommendations from cards. On beta blocker continue. This was not ACS.  Elevated LFT's: ?etiology-suspect shock liver from sepsis.USG abd and recently questions mild cirrhosis, Hepatitis serology neg. LFT's downtrending, ferritin level was elevated will have outpatient GI follow-up.  End-stage renal disease: On hemodialysis-Monday, Wednesday and Friday. Nephrology following  Gout: Continue with colcichine  Chronic skin rash: I Discussed his case with his dermatologist Dr. Allyson Sabal who describes this as nonspecific dermatitis. Supportive care only.  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 We will place him on glucose stabilizer for now and monitor CBGs.  CBG (last 3)   Recent Labs  11/24/14 2156 11/25/14 0800 11/25/14 0838  GLUCAP 472* 460* 440*    Lab Results  Component Value Date   HGBA1C 9.4* 11/07/2014      Disposition: Remain inpatient-home when mental status improves  Antimicrobial agents  See below  Anti-infectives    Start     Dose/Rate Route Frequency Ordered Stop   11/22/14 1015  acyclovir (ZOVIRAX) 800 mg in dextrose 5 % 150 mL IVPB  Status:  Discontinued     800 mg 166 mL/hr over 60 Minutes Intravenous Every 12 hours 11/22/14 1001 11/22/14 1009   11/22/14 1015  acyclovir (ZOVIRAX) 400 mg in dextrose 5 %  100 mL IVPB  Status:  Discontinued     400 mg 108 mL/hr over 60 Minutes Intravenous Every 24 hours 11/22/14 1010 11/25/14 0758   11/22/14  1000  acyclovir (ZOVIRAX) 200 MG capsule 200 mg  Status:  Discontinued     200 mg Oral 3 times daily 11/22/14 0826 11/22/14 1000   11/19/14 1400  vancomycin (VANCOCIN) 50 mg/mL oral solution 500 mg     500 mg Oral 4 times per day 11/19/14 1001 11/25/14 1359   11/18/14 1400  vancomycin (VANCOCIN) 125 MG capsule 250 mg  Status:  Discontinued     250 mg Oral 4 times daily 11/18/14 1128 11/18/14 1248   11/18/14 1400  vancomycin (VANCOCIN) 50 mg/mL oral solution 125 mg  Status:  Discontinued     125 mg Oral 4 times per day 11/18/14 1248 11/18/14 1320   11/18/14 1400  vancomycin (VANCOCIN) 50 mg/mL oral solution 250 mg  Status:  Discontinued     250 mg Oral 4 times per day 11/18/14 1320 11/19/14 1001      DVT Prophylaxis: Coumadin/Heparin gtt  Code Status: Full code   Family Communication Spouse    Procedures:  LP. Showing mildly increased protein levels  EEG - recorded two seizures which appear to arise from the left hemisphere as well as signs of irritability with potential for seizure origination in the right temporal region as well.     CONSULTS:  nephrology , Cards, neurology   Time spent 35 minutes-Greater than 50% of this time was spent in counseling, explanation of diagnosis, planning of further management, and coordination of care.  MEDICATIONS: Scheduled Meds: . antiseptic oral rinse  7 mL Mouth Rinse 6 times per day  . calcitRIOL  1.75 mcg Oral Q M,W,F-HD  . clobetasol cream  1 application Topical BID  . colchicine  0.3 mg Oral Once per day on Mon Thu  . diltiazem  30 mg Oral 4 times per day  . feeding supplement (NEPRO CARB STEADY)  237 mL Oral BID BM  . insulin regular  0-10 Units Intravenous TID WC  . LORazepam  1 mg Intravenous Once  . methylPREDNISolone (SOLU-MEDROL) injection  1,000 mg Intravenous Daily  . metoprolol  50 mg Oral BID  . multivitamin  1 tablet Oral QHS  . pantoprazole  20 mg Oral BID  . phenytoin (DILANTIN) IV  100 mg Intravenous 3 times  per day  . saccharomyces boulardii  250 mg Oral BID  . sevelamer carbonate  1,600 mg Oral TID WC  . vancomycin  500 mg Oral 4 times per day  . warfarin  7.5 mg Oral ONCE-1800  . Warfarin - Pharmacist Dosing Inpatient   Does not apply q1800   Continuous Infusions: . sodium chloride 10 mL/hr at 11/18/14 2300  . sodium chloride    . heparin 1,400 Units/hr (11/24/14 1701)  . insulin (NOVOLIN-R) infusion     PRN Meds:.sodium chloride, sodium chloride, camphor-menthol, dextrose, diphenhydrAMINE, feeding supplement (NEPRO CARB STEADY), heparin, heparin, lidocaine (PF), lidocaine-prilocaine, metoprolol, pentafluoroprop-tetrafluoroeth    PHYSICAL EXAM: Vital signs in last 24 hours: Filed Vitals:   11/24/14 0436 11/24/14 1346 11/24/14 2159 11/25/14 0618  BP: 110/68 126/80 127/86 135/77  Pulse: 101 51 53 78  Temp: 98.6 F (37 C) 98.8 F (37.1 C) 98.9 F (37.2 C) 98.6 F (37 C)  TempSrc: Oral Oral Oral Oral  Resp: 18 20 18 18   Height:      Weight:      SpO2: 98%  95% 97% 97%    Weight change:  Filed Weights   11/21/14 1229 11/23/14 0439 11/23/14 0743  Weight: 79.6 kg (175 lb 7.8 oz) 80.6 kg (177 lb 11.1 oz) 73.4 kg (161 lb 13.1 oz)   Body mass index is 21.94 kg/(m^2).   Gen Exam: Awake/alert-answers few questions appropriately today Neck: Supple, No JVD.   Chest: B/L Clear.   CVS: S1 S2 Regular, no murmurs.  Abdomen: soft, BS +, non tender, non distended.  Extremities: no edema, lower extremities warm to touch. Neurologic: Non Focal.  Skin:papular dry rash all over mostly on the back Wounds: N/A.    Intake/Output from previous day:  Intake/Output Summary (Last 24 hours) at 11/25/14 0854 Last data filed at 11/24/14 1835  Gross per 24 hour  Intake    520 ml  Output    300 ml  Net    220 ml     LAB RESULTS: CBC  Recent Labs Lab 11/21/14 0830 11/22/14 0511 11/23/14 0532 11/24/14 0352 11/25/14 0514  WBC 15.6* 13.9* 12.0* 13.6* 12.6*  HGB 11.9* 11.3* 12.3* 11.7*  10.8*  HCT 34.3* 34.2* 36.4* 35.3* 32.5*  PLT 178 142* 139* 136* 111*  MCV 90.7 92.4 93.6 94.9 93.9  MCH 31.5 30.5 31.6 31.5 31.2  MCHC 34.7 33.0 33.8 33.1 33.2  RDW 17.3* 17.3* 17.5* 17.7* 17.7*    Chemistries   Recent Labs Lab 11/21/14 0830 11/22/14 0511 11/23/14 0532 11/24/14 0352 11/25/14 0514  NA 134* 133* 135 137 126*  K 4.0 3.7 4.1 4.0 4.5  CL 99* 97* 96* 98* 91*  CO2 23 24 24 29 26   GLUCOSE 60* 54* 52* 50* 498*  BUN 88* 46* 57* 29* 56*  CREATININE 5.89* 3.97* 4.68* 3.20* 4.40*  CALCIUM 8.0* 7.6* 7.9* 7.7* 7.7*    CBG:  Recent Labs Lab 11/24/14 1210 11/24/14 1654 11/24/14 2156 11/25/14 0800 11/25/14 0838  GLUCAP 317* 373* 472* 460* 440*    GFR Estimated Creatinine Clearance: 18.8 mL/min (by C-G formula based on Cr of 4.4).  Coagulation profile  Recent Labs Lab 11/21/14 0830 11/22/14 0511 11/23/14 0532 11/24/14 0352 11/25/14 0514  INR 1.66* 1.45 1.31 1.29 1.38    Cardiac Enzymes  Recent Labs Lab 11/23/14 1615 11/23/14 2112 11/24/14 0352  TROPONINI 0.52* 0.53* 0.51*    Invalid input(s): POCBNP No results for input(s): DDIMER in the last 72 hours. No results for input(s): HGBA1C in the last 72 hours. No results for input(s): CHOL, HDL, LDLCALC, TRIG, CHOLHDL, LDLDIRECT in the last 72 hours. No results for input(s): TSH, T4TOTAL, T3FREE, THYROIDAB in the last 72 hours.  Invalid input(s): FREET3 No results for input(s): VITAMINB12, FOLATE, FERRITIN, TIBC, IRON, RETICCTPCT in the last 72 hours. No results for input(s): LIPASE, AMYLASE in the last 72 hours.  Urine Studies No results for input(s): UHGB, CRYS in the last 72 hours.  Invalid input(s): UACOL, UAPR, USPG, UPH, UTP, UGL, UKET, UBIL, UNIT, UROB, ULEU, UEPI, UWBC, URBC, UBAC, CAST, UCOM, BILUA  MICROBIOLOGY: Recent Results (from the past 240 hour(s))  Blood culture (routine x 2)     Status: None   Collection Time: 11/18/14  6:30 AM  Result Value Ref Range Status   Specimen  Description BLOOD LEFT ARM  Final   Special Requests BOTTLES DRAWN AEROBIC AND ANAEROBIC 5CC  Final   Culture NO GROWTH 5 DAYS  Final   Report Status 11/23/2014 FINAL  Final  Blood culture (routine x 2)     Status: None  Collection Time: 11/18/14  6:30 AM  Result Value Ref Range Status   Specimen Description BLOOD LEFT HAND  Final   Special Requests BOTTLES DRAWN AEROBIC AND ANAEROBIC 5CC  Final   Culture NO GROWTH 5 DAYS  Final   Report Status 11/23/2014 FINAL  Final  Urine culture     Status: None   Collection Time: 11/18/14 11:30 AM  Result Value Ref Range Status   Specimen Description URINE, RANDOM  Final   Special Requests NONE  Final   Culture   Final    >=100,000 COLONIES/mL ENTEROCOCCUS SPECIES 30,000 COLONIES/mL ESCHERICHIA COLI    Report Status 11/21/2014 FINAL  Final   Organism ID, Bacteria ENTEROCOCCUS SPECIES  Final   Organism ID, Bacteria ESCHERICHIA COLI  Final      Susceptibility   Escherichia coli - MIC*    AMPICILLIN >=32 RESISTANT Resistant     CEFAZOLIN >=64 RESISTANT Resistant     CEFTRIAXONE <=1 SENSITIVE Sensitive     CIPROFLOXACIN <=0.25 SENSITIVE Sensitive     GENTAMICIN <=1 SENSITIVE Sensitive     IMIPENEM <=0.25 SENSITIVE Sensitive     NITROFURANTOIN <=16 SENSITIVE Sensitive     TRIMETH/SULFA <=20 SENSITIVE Sensitive     AMPICILLIN/SULBACTAM 8 SENSITIVE Sensitive     PIP/TAZO <=4 SENSITIVE Sensitive     * 30,000 COLONIES/mL ESCHERICHIA COLI   Enterococcus species - MIC*    AMPICILLIN <=2 SENSITIVE Sensitive     LEVOFLOXACIN 1 SENSITIVE Sensitive     NITROFURANTOIN <=16 SENSITIVE Sensitive     VANCOMYCIN 2 SENSITIVE Sensitive     LINEZOLID 1 SENSITIVE Sensitive     * >=100,000 COLONIES/mL ENTEROCOCCUS SPECIES  MRSA PCR Screening     Status: None   Collection Time: 11/18/14 11:30 AM  Result Value Ref Range Status   MRSA by PCR NEGATIVE NEGATIVE Final    Comment:        The GeneXpert MRSA Assay (FDA approved for NASAL specimens only), is one  component of a comprehensive MRSA colonization surveillance program. It is not intended to diagnose MRSA infection nor to guide or monitor treatment for MRSA infections.   Clostridium Difficile by PCR (not at [facility])     Status: None   Collection Time: 11/19/14  3:50 PM  Result Value Ref Range Status   Toxigenic C Difficile by pcr NEGATIVE NEGATIVE Final  CSF culture with Stat gram stain     Status: None (Preliminary result)   Collection Time: 11/23/14  4:30 PM  Result Value Ref Range Status   Specimen Description CSF  Final   Special Requests NONE  Final   Gram Stain   Final    WBC PRESENT, PREDOMINANTLY MONONUCLEAR NO ORGANISMS SEEN CYTOSPIN    Culture NO GROWTH < 24 HOURS  Final   Report Status PENDING  Incomplete    RADIOLOGY STUDIES/RESULTS: Dg Chest 2 View  11/18/2014   CLINICAL DATA:  Altered mental status. Shortness of breath and nausea today.  EXAM: CHEST  2 VIEW  COMPARISON:  11/05/2014  FINDINGS: Chronic cardiomegaly and vascular congestion, unchanged. Diminished right pleural effusion and basilar opacity from prior exam. No new confluent airspace disease or pneumothorax. No acute osseous abnormalities.  IMPRESSION: 1. Diminished right pleural effusion and right basilar opacity. 2. Stable chronic cardiomegaly and vascular congestion.   Electronically Signed   By: Jeb Levering M.D.   On: 11/18/2014 06:21   Dg Chest 2 View  11/05/2014   CLINICAL DATA:  Dyspnea for 1 day  EXAM:  CHEST  2 VIEW  COMPARISON:  July 03, 2014  FINDINGS: The heart size and mediastinal contours are stable. There is mild patchy consolidation of lateral right lung base with small right pleural effusion. There is mild chronic central pulmonary vascular congestion. The visualized skeletal structures are stable.  IMPRESSION: Chronic central pulmonary vascular congestion.  Small right pleural effusion with associated mild patchy opacity of right lung base which could be due to atelectasis but superimposed  pneumonia is not excluded.   Electronically Signed   By: Abelardo Diesel M.D.   On: 11/05/2014 21:54   Ct Head Wo Contrast  11/18/2014   CLINICAL DATA:  Altered mental status.  EXAM: CT HEAD WITHOUT CONTRAST  TECHNIQUE: Contiguous axial images were obtained from the base of the skull through the vertex without intravenous contrast.  COMPARISON:  None.  FINDINGS: No intracranial hemorrhage, mass effect, or midline shift. Small focal encephalomalacia in the left occipital lobe. No hydrocephalus. The basilar cisterns are patent. No evidence of territorial infarct. No intracranial fluid collection. Calvarium is intact. Included paranasal sinuses and mastoid air cells are well aerated.  IMPRESSION: 1.  No acute intracranial abnormality. 2. Small focal encephalomalacia in the left occipital lobe.   Electronically Signed   By: Jeb Levering M.D.   On: 11/18/2014 06:13   Mr Brain Wo Contrast  11/21/2014   CLINICAL DATA:  Altered mental status. Encephalopathy. Atrial fibrillation.  EXAM: MRI HEAD WITHOUT CONTRAST  TECHNIQUE: Multiplanar, multiecho pulse sequences of the brain and surrounding structures were obtained without intravenous contrast.  COMPARISON:  CT head without contrast from the same day.  FINDINGS: Restricted diffusion and increased T2 signal is present within the medial temporal lobes and hippocampal structures bilaterally. Inferior temporal lobes are intact.  Moderate generalized atrophy and diffuse periventricular T2 changes bilaterally are advanced for age. The ventricles are of normal size.  Flow is present in the major intracranial arteries. The globes and orbits are intact. The paranasal sinuses are clear. There is some fluid in the right mastoid air cells. No obstructing nasopharyngeal lesion is evident.  IMPRESSION: Abnormal T2 signal and restricted diffusion within the medial temporal lobes and hippocampus bilaterally. 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 There is no hemorrhage  or signal change along the undersurface the temporal lobes, but herpes encephalitis is also considered. Status epilepticus is also on the differential diagnosis.  Atrophy in T2 signal changes are advanced for age. This is nonspecific, but likely reflects the sequela of chronic microvascular ischemia.  These results will be called to the ordering clinician or representative by the Radiologist Assistant, and communication documented in the PACS or zVision Dashboard.   Electronically Signed   By: San Morelle M.D.   On: 11/21/2014 19:01   US Abdomen Complete  11/06/2014   CLINICAL DATA:  Abnormal liver function tests  EXAM: ULTRASOUND ABDOMEN COMPLETE  COMPARISON:  None.  FINDINGS: Gallbladder: No gallstones or wall thickening visualized. No sonographic Murphy sign noted.  Common bile duct: Diameter: 4 mm  Liver: Minimally nodular contour with no focal abnormalities.  IVC: No abnormality visualized.  Pancreas: Visualized portion unremarkable.  Spleen: Size and appearance within normal limits.  Right Kidney: Length: 10.7 cm. Midpole cyst measuring 14 mm. Mildly increased echogenicity.  Left Kidney: Length: 10.8 cm. Lower pole cyst measures 1 cm. Mildly increased echogenicity.  Abdominal aorta: No aneurysm identified. Limited visualization distally due to bowel gas.  Other findings: Right pleural effusion.  Small volume ascites.  IMPRESSION: Mildly  nodular liver contour with small volume of ascites and right pleural effusion. Possibility of cirrhosis not excluded.  Evidence of medical renal disease.   Electronically Signed   By: Skipper Cliche M.D.   On: 11/06/2014 21:38    Thurnell Lose, MD  [facility] Pager:[phone]  If 7PM-7AM, please contact night-coverage [email or web address] Password [email or web address] 11/25/2014, 8:54 AM   LOS: 7 days

## 2014-11-25 NOTE — Progress Notes (Signed)
Subjective: Patient appears slightly more alert/oriented this morning. Received first dose of solumedrol yesterday.  HSV PCR negative so discontinued acyclovir  Pending CSF studies:  Lyme ab, CMV, VZV, EBV, paraneoplastic antibodies  Objective: Current vital signs: BP 135/77 mmHg  Pulse 78  Temp(Src) 98.6 F (37 C) (Oral)  Resp 18  Ht 6' (1.829 m)  Wt 73.4 kg (161 lb 13.1 oz)  BMI 21.94 kg/m2  SpO2 97% Vital signs in last 24 hours: Temp:  [98.6 F (37 C)-98.9 F (37.2 C)] 98.6 F (37 C) (08/07 0618) Pulse Rate:  [51-78] 78 (08/07 0618) Resp:  [18-20] 18 (08/07 0618) BP: (126-135)/(77-86) 135/77 mmHg (08/07 0618) SpO2:  [95 %-97 %] 97 % (08/07 0618)  Intake/Output from previous day: 08/06 0701 - 08/07 0700 In: 520 [P.O.:520] Out: 300 [Urine:300] Intake/Output this shift:   Nutritional status: Diet renal with fluid restriction Fluid restriction:: 1200 mL Fluid; Room service appropriate?: Yes; Fluid consistency:: Thin  Neurologic Exam: Mental Status: Alert, oriented to name, "[facility]", states June 2012 for date and [place] for president. Speech fluent without evidence of aphasia.Intermittently following commands.  Cranial Nerves: II: Visual fields grossly normal, pupils equal, round, reactive to light  III,IV, VI: ptosis not present, extra-ocular motions intact bilaterally V,VII: smile symmetric Motor: Moves all extremities symmetrically and against gravity Sensory: light touch intact throughout, bilaterally   Lab Results: Basic Metabolic Panel:  Recent Labs Lab 11/19/14 0550  11/21/14 0800 11/21/14 0830 11/22/14 0511 11/23/14 0532 11/24/14 0352 11/25/14 0514  NA 127*  < >  --  134* 133* 135 137 126*  K 5.9*  < >  --  4.0 3.7 4.1 4.0 4.5  CL 92*  < >  --  99* 97* 96* 98* 91*  CO2 21*  < >  --  23 24 24 29 26   GLUCOSE 263*  < >  --  60* 54* 52* 50* 498*  BUN 94*  < >  --  88* 46* 57* 29* 56*  CREATININE 6.88*  < >  --  5.89* 3.97* 4.68* 3.20* 4.40*   CALCIUM 8.2*  < >  --  8.0* 7.6* 7.9* 7.7* 7.7*  PHOS 8.8*  --  6.1*  --  4.1 4.5  --   --   < > = values in this interval not displayed.  Liver Function Tests:  Recent Labs Lab 11/20/14 0605 11/20/14 1102 11/21/14 0800 11/22/14 0511 11/23/14 0532 11/24/14 0352 11/25/14 0514  AST 178* 161*  --   --  127* 107* 86*  ALT 232* 230*  --   --  169* 136* 119*  ALKPHOS 181* 180*  --   --  209* 208* 191*  BILITOT 1.3* 1.5*  --   --  1.5* 1.5* 1.2  PROT 5.8* 6.0*  --   --  6.3* 5.7* 5.5*  ALBUMIN 3.0* 3.3* 3.0* 2.9* 3.2*  3.2* 2.9* 2.8*   No results for input(s): LIPASE, AMYLASE in the last 168 hours.  Recent Labs Lab 11/20/14 1102  AMMONIA 44*    CBC:  Recent Labs Lab 11/21/14 0830 11/22/14 0511 11/23/14 0532 11/24/14 0352 11/25/14 0514  WBC 15.6* 13.9* 12.0* 13.6* 12.6*  HGB 11.9* 11.3* 12.3* 11.7* 10.8*  HCT 34.3* 34.2* 36.4* 35.3* 32.5*  MCV 90.7 92.4 93.6 94.9 93.9  PLT 178 142* 139* 136* PENDING    Cardiac Enzymes:  Recent Labs Lab 11/23/14 1615 11/23/14 2112 11/24/14 0352  TROPONINI 0.52* 0.53* 0.51*    Lipid Panel: No results for input(s):  CHOL, TRIG, HDL, CHOLHDL, VLDL, LDLCALC in the last 168 hours.  CBG:  Recent Labs Lab 11/23/14 2100 11/24/14 0745 11/24/14 1210 11/24/14 1654 11/24/14 2156  GLUCAP 109* 138* 317* 373* 472*    Microbiology: Results for orders placed or performed during the hospital encounter of 11/18/14  Blood culture (routine x 2)     Status: None   Collection Time: 11/18/14  6:30 AM  Result Value Ref Range Status   Specimen Description BLOOD LEFT ARM  Final   Special Requests BOTTLES DRAWN AEROBIC AND ANAEROBIC 5CC  Final   Culture NO GROWTH 5 DAYS  Final   Report Status 11/23/2014 FINAL  Final  Blood culture (routine x 2)     Status: None   Collection Time: 11/18/14  6:30 AM  Result Value Ref Range Status   Specimen Description BLOOD LEFT HAND  Final   Special Requests BOTTLES DRAWN AEROBIC AND ANAEROBIC 5CC   Final   Culture NO GROWTH 5 DAYS  Final   Report Status 11/23/2014 FINAL  Final  Urine culture     Status: None   Collection Time: 11/18/14 11:30 AM  Result Value Ref Range Status   Specimen Description URINE, RANDOM  Final   Special Requests NONE  Final   Culture   Final    >=100,000 COLONIES/mL ENTEROCOCCUS SPECIES 30,000 COLONIES/mL ESCHERICHIA COLI    Report Status 11/21/2014 FINAL  Final   Organism ID, Bacteria ENTEROCOCCUS SPECIES  Final   Organism ID, Bacteria ESCHERICHIA COLI  Final      Susceptibility   Escherichia coli - MIC*    AMPICILLIN >=32 RESISTANT Resistant     CEFAZOLIN >=64 RESISTANT Resistant     CEFTRIAXONE <=1 SENSITIVE Sensitive     CIPROFLOXACIN <=0.25 SENSITIVE Sensitive     GENTAMICIN <=1 SENSITIVE Sensitive     IMIPENEM <=0.25 SENSITIVE Sensitive     NITROFURANTOIN <=16 SENSITIVE Sensitive     TRIMETH/SULFA <=20 SENSITIVE Sensitive     AMPICILLIN/SULBACTAM 8 SENSITIVE Sensitive     PIP/TAZO <=4 SENSITIVE Sensitive     * 30,000 COLONIES/mL ESCHERICHIA COLI   Enterococcus species - MIC*    AMPICILLIN <=2 SENSITIVE Sensitive     LEVOFLOXACIN 1 SENSITIVE Sensitive     NITROFURANTOIN <=16 SENSITIVE Sensitive     VANCOMYCIN 2 SENSITIVE Sensitive     LINEZOLID 1 SENSITIVE Sensitive     * >=100,000 COLONIES/mL ENTEROCOCCUS SPECIES  MRSA PCR Screening     Status: None   Collection Time: 11/18/14 11:30 AM  Result Value Ref Range Status   MRSA by PCR NEGATIVE NEGATIVE Final    Comment:        The GeneXpert MRSA Assay (FDA approved for NASAL specimens only), is one component of a comprehensive MRSA colonization surveillance program. It is not intended to diagnose MRSA infection nor to guide or monitor treatment for MRSA infections.   Clostridium Difficile by PCR (not at [facility])     Status: None   Collection Time: 11/19/14  3:50 PM  Result Value Ref Range Status   Toxigenic C Difficile by pcr NEGATIVE NEGATIVE Final  CSF culture with Stat gram stain      Status: None (Preliminary result)   Collection Time: 11/23/14  4:30 PM  Result Value Ref Range Status   Specimen Description CSF  Final   Special Requests NONE  Final   Gram Stain   Final    WBC PRESENT, PREDOMINANTLY MONONUCLEAR NO ORGANISMS SEEN CYTOSPIN    Culture NO GROWTH <  24 HOURS  Final   Report Status PENDING  Incomplete    Coagulation Studies:  Recent Labs  11/23/14 0532 11/24/14 0352 11/25/14 0514  LABPROT 16.4* 16.3* 17.1*  INR 1.31 1.29 1.38    Imaging: No results found.  Medications:  Scheduled: . antiseptic oral rinse  7 mL Mouth Rinse 6 times per day  . calcitRIOL  1.75 mcg Oral Q M,W,F-HD  . clobetasol cream  1 application Topical BID  . colchicine  0.3 mg Oral Once per day on Mon Thu  . diltiazem  30 mg Oral 4 times per day  . feeding supplement (NEPRO CARB STEADY)  237 mL Oral BID BM  . insulin regular  0-10 Units Intravenous TID WC  . LORazepam  1 mg Intravenous Once  . methylPREDNISolone (SOLU-MEDROL) injection  1,000 mg Intravenous Daily  . metoprolol  50 mg Oral BID  . multivitamin  1 tablet Oral QHS  . pantoprazole  20 mg Oral BID  . phenytoin (DILANTIN) IV  100 mg Intravenous 3 times per day  . saccharomyces boulardii  250 mg Oral BID  . sevelamer carbonate  1,600 mg Oral TID WC  . vancomycin  500 mg Oral 4 times per day  . Warfarin - Pharmacist Dosing Inpatient   Does not apply q1800    Assessment/Plan: [name] with AMS in the setting of C. Diff, hyperglycemia, hyponatremia and elevated WBC. Exam is non-focal other than confusion.EEG shows intermittent seizure activity and patient was started on dilantin.No current seizure activity noted. MRI also shows abnormal T2 signal and restricted diffusion within the medial temporal lobes and hippocampus bilaterally. 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  CSF results so far not consistent with an infectious process. HSV PCR negative so acyclovir d/c.  CSF studies pending. Note slight improvement in mental status today.   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  4) Repeat EEG. Will consider repeat MRI brain in the future 5) Will continue to monitor   Jim Like, DO [facility] [phone]  If 7pm- 7am, please page neurology on call as listed in [place].

## 2014-11-25 NOTE — Progress Notes (Signed)
[facility] Rounding Note  Subjective:    "Hey Doc!" Very alert, but says we are at "[facility] in [facility]", "came over after the football game to get checked out" When told his thinking is off, he agrees Started high dose steroids yesterday for possibility of limbic encephalitis Biggest complaint is of pruritus from the innumerable skin lesions on his back  Objective Filed Vitals:   11/24/14 0436 11/24/14 1346 11/24/14 2159 11/25/14 0618  BP: 110/68 126/80 127/86 135/77  Pulse: 101 51 53 78  Temp: 98.6 F (37 C) 98.8 F (37.1 C) 98.9 F (37.2 C) 98.6 F (37 C)  TempSrc: Oral Oral Oral Oral  Resp: 18 20 18 18   Height:      Weight:      SpO2: 98% 95% 97% 97%   Physical Exam General: alert, oriented to person.  Confused, not oriented to place or date but to person and remembers me Remains quite pleasant Numerous round skin lesions primarily on back and shoulders, heaped up margins, flat centers Heart: irreg irreg Rate around 90 S1S2 No S3 Lungs: Clear without crackles or wheezes Abdomen: soft, nontender +BS Extremities: Trace LE edema bilaterally Dialysis Access: L AVG +b/t  Additional Objective Labs: Basic Metabolic Panel:  Recent Labs Lab 11/21/14 0800  11/22/14 0511 11/23/14 0532 11/24/14 0352 11/25/14 0514  NA  --   < > 133* 135 137 126*  K  --   < > 3.7 4.1 4.0 4.5  CL  --   < > 97* 96* 98* 91*  CO2  --   < > 24 24 29 26   GLUCOSE  --   < > 54* 52* 50* 498*  BUN  --   < > 46* 57* 29* 56*  CREATININE  --   < > 3.97* 4.68* 3.20* 4.40*  CALCIUM  --   < > 7.6* 7.9* 7.7* 7.7*  PHOS 6.1*  --  4.1 4.5  --   --   < > = values in this interval not displayed.   Recent Labs Lab 11/23/14 0532 11/24/14 0352 11/25/14 0514  AST 127* 107* 86*  ALT 169* 136* 119*  ALKPHOS 209* 208* 191*  BILITOT 1.5* 1.5* 1.2  PROT 6.3* 5.7* 5.5*  ALBUMIN 3.2*  3.2* 2.9* 2.8*   CBC:  Recent Labs Lab 11/21/14 0830 11/22/14 0511 11/23/14 0532 11/24/14 0352  11/25/14 0514  WBC 15.6* 13.9* 12.0* 13.6* 12.6*  HGB 11.9* 11.3* 12.3* 11.7* 10.8*  HCT 34.3* 34.2* 36.4* 35.3* 32.5*  MCV 90.7 92.4 93.6 94.9 93.9  PLT 178 142* 139* 136* PENDING       Component Value Date/Time   SDES CSF 11/23/2014 1630   SPECREQUEST NONE 11/23/2014 1630   CULT NO GROWTH < 24 HOURS 11/23/2014 1630   REPTSTATUS PENDING 11/23/2014 1630   HSV1, HSV2 PCR's negative CSF culture negative to date   Recent Labs Lab 11/23/14 1615 11/23/14 2112 11/24/14 0352  TROPONINI 0.52* 0.53* 0.51*     Recent Labs Lab 11/23/14 2100 11/24/14 0745 11/24/14 1210 11/24/14 1654 11/24/14 2156  GLUCAP 109* 138* 317* 373* 472*    Medications: . sodium chloride 10 mL/hr at 11/18/14 2300  . sodium chloride    . heparin 1,400 Units/hr (11/24/14 1701)  . insulin (NOVOLIN-R) infusion     . acyclovir  400 mg Intravenous Q24H  . antiseptic oral rinse  7 mL Mouth Rinse 6 times per day  . calcitRIOL  1.75 mcg Oral Q M,W,F-HD  . clobetasol  cream  1 application Topical BID  . colchicine  0.3 mg Oral Once per day on Mon Thu  . diltiazem  30 mg Oral 4 times per day  . feeding supplement (NEPRO CARB STEADY)  237 mL Oral BID BM  . insulin regular  0-10 Units Intravenous TID WC  . LORazepam  1 mg Intravenous Once  . methylPREDNISolone (SOLU-MEDROL) injection  1,000 mg Intravenous Daily  . metoprolol  50 mg Oral BID  . multivitamin  1 tablet Oral QHS  . pantoprazole  20 mg Oral BID  . phenytoin (DILANTIN) IV  100 mg Intravenous 3 times per day  . saccharomyces boulardii  250 mg Oral BID  . sevelamer carbonate  1,600 mg Oral TID WC  . vancomycin  500 mg Oral 4 times per day  . Warfarin - Pharmacist Dosing Inpatient   Does not apply q1800    Dialysis Orders: Center: [facility] MWF 180 EDW 80 3 K 2.5 Ca 450/800 profile 2 AVGG heparin 6100 no Fe or ESA calcitriol 1.75 Recent labs; 7/27 Hgb 11.6 WBC ^ at 12.9 89% N ferritin 1122 42% sat Ca/P ok iPTH 427 stable hgb A1C 10.7 up from 6.1 in  April and 7.4 in January   Assessment/Plan: 1. DM - DKA on admission. Steroids pushing sugars up - nearly 500 this AM. May end up back on insulin drip. Management per primary service.  2. C. Diff colitis- on appropriate abx. CDiff negative as of 8/1 (+ 7/19). Primary planning 10 day course from 7/31. The high dose steroids may prompt a longer duration.  3. Encephalopathy - seizure activity on EEG. 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 On anticonvulsants, acyclovir and solumedrol and pending LP- no growth < 24 hrs, HSV PCR's neg. Very alert but remains confused. 4. Seizures - IV dilantin  5. ESRD- continue with HD MWF via AVG- next HD Monday.  6. Enterococcal UTI- on no prolonged rx at this time. Rec'd 1 dose fosfomycin on 8/2 7. Anemia of chronic disease- currently ESA on hold due to Hgb as high as 11.7- today is 10.8. Cont to watch CBC 8. CKD-MBD: Cont with binders (phos in range now) and calcitriol with HD. Check phos pre HD 8/8 9. Abnormal LFT's-? Etiology. Trending down. No abd pain. [place] 11/06/14- mild nodular liver contour w mall volume of ascitis. Possibility of cirrhosis not excluded.Ammonia level 44 on 8/2 10. Nutrition:per primary 11. Hypertension: stable 12. A fib- rate controlled, on IV heparin and warfarin has been resumed per pharmacy.  Jamal Maes, MD [facility] [phone] Pager 11/25/2014, 7:45 AM

## 2014-11-25 NOTE — Progress Notes (Signed)
ANTICOAGULATION CONSULT NOTE - Follow Up Consult  Pharmacy Consult for heparin and coumadin Indication: atrial fibrillation  Allergies  Allergen Reactions  . Dilaudid [Hydromorphone] Nausea And Vomiting    Patient Measurements: Height: 6' (182.9 cm) Weight: 161 lb 13.1 oz (73.4 kg) (stood to scale ) IBW/kg (Calculated) : 77.6   Vital Signs: Temp: 98.6 F (37 C) (08/07 0618) Temp Source: Oral (08/07 0618) BP: 135/77 mmHg (08/07 0618) Pulse Rate: 78 (08/07 0618)  Labs:  Recent Labs  11/23/14 0532 11/23/14 1615 11/23/14 2112 11/24/14 0352 11/24/14 1012 11/24/14 2132 11/25/14 0514  HGB 12.3*  --   --  11.7*  --   --  10.8*  HCT 36.4*  --   --  35.3*  --   --  32.5*  PLT 139*  --   --  136*  --   --  PENDING  LABPROT 16.4*  --   --  16.3*  --   --  17.1*  INR 1.31  --   --  1.29  --   --  1.38  HEPARINUNFRC  --   --   --   --  0.19* 0.31 0.81*  CREATININE 4.68*  --   --  3.20*  --   --  4.40*  TROPONINI  --  0.52* 0.53* 0.51*  --   --   --     Estimated Creatinine Clearance: 18.8 mL/min (by C-G formula based on Cr of 4.4).   Assessment: 59 year old male admitted 11/18/2014 with altered mental status. On warfarin PTA for afib and supratherapeutic INR on admission 3.64>6.53. Given Vit K 5 mg x1 and INR decreased to 1.31. S/p LP - Heparin resumed 8/5 @1800 , Coumadin resumed 8/6  After the heparin rate was increased, HL 0.31 (therapeutic) last night, this AM HL has increased to 0.81 (supratherapeutic) without change in rate.INR 1.38, H/H stable, no bleeding reported.  Goal of Therapy:  INR 2-3 Heparin level 0.3-0.7 units/ml Monitor platelets by anticoagulation protocol: Yes   Plan:  - Decrease heparin rate to 1350 units/hr - Give Coumadin 7.5 mg x1 today - F/u 1630 HL - Monitor INR, CBC, s/sx of bleeding  Dimitri Ped, PharmD. Clinical Pharmacist Resident Pager: [phone]

## 2014-11-25 NOTE — Progress Notes (Signed)
ANTICOAGULATION CONSULT NOTE - Follow Up Consult  Pharmacy Consult for heparin Indication: atrial fibrillation  Allergies  Allergen Reactions  . Dilaudid [Hydromorphone] Nausea And Vomiting    Patient Measurements: Height: 6' (182.9 cm) Weight: 161 lb 13.1 oz (73.4 kg) (stood to scale ) IBW/kg (Calculated) : 77.6   Vital Signs: Temp: 98.5 F (36.9 C) (08/07 1424) Temp Source: Oral (08/07 1424) BP: 160/113 mmHg (08/07 1424) Pulse Rate: 88 (08/07 1424)  Labs:  Recent Labs  11/23/14 0532 11/23/14 1615 11/23/14 2112 11/24/14 0352  11/24/14 2132 11/25/14 0514 11/25/14 1623  HGB 12.3*  --   --  11.7*  --   --  10.8*  --   HCT 36.4*  --   --  35.3*  --   --  32.5*  --   PLT 139*  --   --  136*  --   --  111*  --   LABPROT 16.4*  --   --  16.3*  --   --  17.1*  --   INR 1.31  --   --  1.29  --   --  1.38  --   HEPARINUNFRC  --   --   --   --   < > 0.31 0.81* 2.00*  CREATININE 4.68*  --   --  3.20*  --   --  4.40*  --   TROPONINI  --  0.52* 0.53* 0.51*  --   --   --   --   < > = values in this interval not displayed.  Estimated Creatinine Clearance: 18.8 mL/min (by C-G formula based on Cr of 4.4).   Assessment: 59 year old male admitted 11/18/2014 with altered mental status. On warfarin PTA for afib and supratherapeutic INR on admission 3.64>6.53. Given Vit K 5 mg x1 and INR decreased to 1.31. S/p LP - Heparin resumed 8/5 @1800 , Coumadin resumed 8/6  After the heparin rate was increased, HL 0.31 (therapeutic) last night, this AM HL has increased to 0.81 (supratherapeutic) without change in rate. Later this afternoon, heparin level increased to 2. Spoke with RN and she stated she turned off the heparin for lab technician. Heparin running through the hand and patient knew his blood was drawn from [facility]. Possible heparin infusion affected blood that was drawn. No bleeding or oozing per RN.  Goal of Therapy:  INR 2-3 Heparin level 0.3-0.7 units/ml Monitor platelets by  anticoagulation protocol: Yes   Plan:  - hold heparin x1 hour, then decrease heparin rate to 1100 units/hr at 1930 Concerned this is a true level with heparin and sticks being peripheral - next HL at 0200 -daily HL and CBC  Pualani Borah D. Maddie Brazier, PharmD, BCPS Clinical Pharmacist Pager: [phone] 11/25/2014 6:23 PM

## 2014-11-26 ENCOUNTER — Ambulatory Visit (HOSPITAL_COMMUNITY): Payer: 59

## 2014-11-26 ENCOUNTER — Inpatient Hospital Stay (HOSPITAL_COMMUNITY): Payer: Medicare Other

## 2014-11-26 DIAGNOSIS — G9340 Encephalopathy, unspecified: Secondary | ICD-10-CM

## 2014-11-26 DIAGNOSIS — Z992 Dependence on renal dialysis: Secondary | ICD-10-CM

## 2014-11-26 DIAGNOSIS — R410 Disorientation, unspecified: Secondary | ICD-10-CM | POA: Insufficient documentation

## 2014-11-26 DIAGNOSIS — N186 End stage renal disease: Secondary | ICD-10-CM

## 2014-11-26 LAB — CBC
HEMATOCRIT: 30.2 % — AB (ref 39.0–52.0)
Hemoglobin: 10.4 g/dL — ABNORMAL LOW (ref 13.0–17.0)
MCH: 31.5 pg (ref 26.0–34.0)
MCHC: 34.4 g/dL (ref 30.0–36.0)
MCV: 91.5 fL (ref 78.0–100.0)
Platelets: 113 10*3/uL — ABNORMAL LOW (ref 150–400)
RBC: 3.3 MIL/uL — ABNORMAL LOW (ref 4.22–5.81)
RDW: 17.4 % — ABNORMAL HIGH (ref 11.5–15.5)
WBC: 13.9 10*3/uL — ABNORMAL HIGH (ref 4.0–10.5)

## 2014-11-26 LAB — GLUCOSE, CAPILLARY
GLUCOSE-CAPILLARY: 146 mg/dL — AB (ref 65–99)
GLUCOSE-CAPILLARY: 162 mg/dL — AB (ref 65–99)
Glucose-Capillary: 101 mg/dL — ABNORMAL HIGH (ref 65–99)
Glucose-Capillary: 103 mg/dL — ABNORMAL HIGH (ref 65–99)
Glucose-Capillary: 110 mg/dL — ABNORMAL HIGH (ref 65–99)
Glucose-Capillary: 116 mg/dL — ABNORMAL HIGH (ref 65–99)
Glucose-Capillary: 130 mg/dL — ABNORMAL HIGH (ref 65–99)
Glucose-Capillary: 186 mg/dL — ABNORMAL HIGH (ref 65–99)
Glucose-Capillary: 242 mg/dL — ABNORMAL HIGH (ref 65–99)

## 2014-11-26 LAB — RENAL FUNCTION PANEL
ALBUMIN: 2.7 g/dL — AB (ref 3.5–5.0)
Anion gap: 13 (ref 5–15)
BUN: 82 mg/dL — ABNORMAL HIGH (ref 6–20)
CO2: 22 mmol/L (ref 22–32)
Calcium: 8 mg/dL — ABNORMAL LOW (ref 8.9–10.3)
Chloride: 90 mmol/L — ABNORMAL LOW (ref 101–111)
Creatinine, Ser: 5 mg/dL — ABNORMAL HIGH (ref 0.61–1.24)
GFR calc Af Amer: 13 mL/min — ABNORMAL LOW (ref >60)
GFR, EST NON AFRICAN AMERICAN: 11 mL/min — AB (ref >60)
Glucose, Bld: 146 mg/dL — ABNORMAL HIGH (ref 65–99)
PHOSPHORUS: 5 mg/dL — AB (ref 2.5–4.6)
Potassium: 4.3 mmol/L (ref 3.5–5.1)
Sodium: 125 mmol/L — ABNORMAL LOW (ref 135–145)

## 2014-11-26 LAB — HEPARIN LEVEL (UNFRACTIONATED)
Heparin Unfractionated: 0.11 IU/mL — ABNORMAL LOW (ref 0.30–0.70)
Heparin Unfractionated: 0.43 IU/mL (ref 0.30–0.70)
Heparin Unfractionated: 0.2 IU/mL — ABNORMAL LOW (ref 0.30–0.70)

## 2014-11-26 LAB — PROTIME-INR
INR: 1.39 (ref 0.00–1.49)
PROTHROMBIN TIME: 17.1 s — AB (ref 11.6–15.2)

## 2014-11-26 MED ORDER — VANCOMYCIN 50 MG/ML ORAL SOLUTION
500.0000 mg | Freq: Four times a day (QID) | ORAL | Status: DC
  Administered 2014-11-26 – 2014-11-30 (×14): 500 mg via ORAL
  Filled 2014-11-26 (×20): qty 10

## 2014-11-26 MED ORDER — LIDOCAINE HCL 1 % IJ SOLN
INTRAMUSCULAR | Status: AC
  Filled 2014-11-26: qty 20

## 2014-11-26 MED ORDER — INSULIN ASPART 100 UNIT/ML ~~LOC~~ SOLN: 0.0000 [IU] | SUBCUTANEOUS | Status: DC

## 2014-11-26 MED ORDER — WARFARIN SODIUM 7.5 MG PO TABS
7.5000 mg | ORAL_TABLET | Freq: Once | ORAL | Status: AC
  Administered 2014-11-26: 7.5 mg via ORAL
  Filled 2014-11-26: qty 1

## 2014-11-26 MED ORDER — INSULIN ASPART 100 UNIT/ML ~~LOC~~ SOLN
0.0000 [IU] | SUBCUTANEOUS | Status: DC
  Administered 2014-11-26: 2 [IU] via SUBCUTANEOUS
  Administered 2014-11-26 – 2014-11-27 (×2): 3 [IU] via SUBCUTANEOUS
  Administered 2014-11-27: 5 [IU] via SUBCUTANEOUS
  Administered 2014-11-27 (×3): 7 [IU] via SUBCUTANEOUS
  Administered 2014-11-27: 3 [IU] via SUBCUTANEOUS
  Administered 2014-11-28: 5 [IU] via SUBCUTANEOUS
  Administered 2014-11-28 (×2): 3 [IU] via SUBCUTANEOUS
  Administered 2014-11-28 (×2): 5 [IU] via SUBCUTANEOUS
  Administered 2014-11-29: 9 [IU] via SUBCUTANEOUS
  Administered 2014-11-29: 5 [IU] via SUBCUTANEOUS
  Administered 2014-11-29: 7 [IU] via SUBCUTANEOUS

## 2014-11-26 NOTE — Care Management Note (Signed)
Case Management Note  Patient Details  Name: Joseph [name] MRN: [id number] Date of Birth: [birth year]  Subjective/Objective:   Per pt eval today, rec SNF, NCM will inform [employer] rep of this information.   CSW  Referral.                   Action/Plan:   Expected Discharge Date:                  Expected Discharge Plan:  Skilled Nursing Facility  In-House Referral:  Clinical Social Work  Discharge planning Services  CM Consult  Post Acute Care Choice:  Home Health Choice offered to:  Spouse  DME Arranged:    DME Agency:     HH Arranged:    [place] Agency:     Status of Service:  In process, will continue to follow  Medicare Important Message Given:  Yes-third notification given Date Medicare IM Given:    Medicare IM give by:    Date Additional Medicare IM Given:    Additional Medicare Important Message give by:     If discussed at [place] of Stay Meetings, dates discussed:    Additional Comments:  [name], RN 11/26/2014, 8:08 PM

## 2014-11-26 NOTE — Progress Notes (Signed)
PT Cancellation Note  Patient Details Name: Joseph [name] MRN: [id number] DOB: 17-Aug-1955   Cancelled Treatment:    Reason Eval/Treat Not Completed: Patient at procedure or test/unavailable (at HD will follow this afternoon).   Duncan Dull 11/26/2014, 10:42 AM Alben Deeds, PT DPT  [phone]

## 2014-11-26 NOTE — Progress Notes (Signed)
Physical Therapy Treatment Patient Details Name: Joseph [name] MRN: [id number] DOB: 04-01-1956 Today's Date: 11/26/2014    History of Present Illness Pt adm metabolic encephalopathy. Due to underlying ESRD, sepsis from C. difficile colitis, mild DKA with ? UTI. AMS not resolving and EEG showed seizures and MRI showed ?infectious process. PMH - ESRD on HD, DM, C-diff    PT Comments    Patient seen and reassessed for mobility post HD. At this time, patient demonstrates increased deficits in functional mobility compared to patient baseline. Spoke with patient and spouse at length regarding concerns for mobility and current levels of assist required. Given increased risk of falls, limited mobility, need for physical assist, and overall level of safety, feel patient will need increased physical therapy services upon acute discharge. At this time, recommendation update for ST SNF upon acute discharge to facilitate recovery of function and improve mobility. Will continue to see and progress as tolerated.   Follow Up Recommendations  SNF;Supervision for mobility/OOB     Equipment Recommendations  None recommended by PT    Recommendations for Other Services       Precautions / Restrictions Precautions Precautions: Fall Restrictions Weight Bearing Restrictions: No    Mobility  Bed Mobility Overal bed mobility: Needs Assistance Bed Mobility: Supine to Sit;Sit to Supine     Supine to sit: Min assist Sit to supine: Min guard   General bed mobility comments: Min assist to come to EOB with increased time  Transfers Overall transfer level: Needs assistance Equipment used: Rolling walker (2 wheeled);1 person hand held assist Transfers: Sit to/from Stand Sit to Stand: Min assist;Mod assist;+2 physical assistance         General transfer comment: Min assist to power up and assist for stability with initial posterior lean.  Ambulation/Gait Ambulation/Gait assistance: Min assist;Mod  assist Ambulation Distance (Feet): 70 Feet (x2) Assistive device: Rolling walker (2 wheeled);1 person hand held assist Gait Pattern/deviations: Step-through pattern;Decreased step length - right;Decreased step length - left;Ataxic;Antalgic;Trunk flexed Gait velocity: decreased Gait velocity interpretation: Below normal speed for age/gender General Gait Details: Patient useing RW with min assist for stability, ocassional LOB and staggering gait. Attempted to progress with hand held assist, patient with increased ataxic patterns requiring moderate assist to stabilize and prevent fall.     Stairs            Wheelchair Mobility    Modified Rankin (Stroke Patients Only)       Balance Overall balance assessment: Needs assistance   Sitting balance-Leahy Scale: Good     Standing balance support: Bilateral upper extremity supported Standing balance-Leahy Scale: Poor Standing balance comment: reliance on RW for support during standing activities                    Cognition Arousal/Alertness: Awake/alert Behavior During Therapy: WFL for tasks assessed/performed Overall Cognitive Status: Impaired/Different from baseline Area of Impairment: Attention;Safety/judgement;Problem solving   Current Attention Level: Selective Memory: Decreased recall of precautions;Decreased short-term memory   Safety/Judgement: Decreased awareness of safety;Decreased awareness of deficits   Problem Solving: Requires verbal cues;Requires tactile cues      Exercises      General Comments        Pertinent Vitals/Pain Pain Assessment: No/denies pain    Home Living                      Prior Function            PT Goals (current  goals can now be found in the care plan section) Acute Rehab PT Goals Patient Stated Goal: Return home PT Goal Formulation: With patient Time For Goal Achievement: 11/27/14 Potential to Achieve Goals: Good Progress towards PT goals: Progressing  toward goals    Frequency  Min 3X/week    PT Plan Discharge plan needs to be updated    Co-evaluation             End of Session Equipment Utilized During Treatment: Gait belt Activity Tolerance: Patient tolerated treatment well Patient left: in bed;with call bell/phone within reach;with bed alarm set;with family/visitor present     Time: 0973-5329 PT Time Calculation (min) (ACUTE ONLY): 18 min  Charges:  $Gait Training: 8-22 mins                    G CodesDuncan Dull 2014/12/17, 4:19 PM Alben Deeds, [place] DPT  [phone]

## 2014-11-26 NOTE — Care Management Important Message (Signed)
Important Message  Patient Details  Name: Joseph [name] MRN: [id number] Date of Birth: 1955-07-30   Medicare Important Message Given:  Yes-third notification given    [name], RN 11/26/2014, 10:05 AMImportant Message  Patient Details  Name: Joseph [name] MRN: [id number] Date of Birth: [birth year]   Medicare Important Message Given:  Yes-third notification given    [name], RN 11/26/2014, 10:05 AM

## 2014-11-26 NOTE — Discharge Instructions (Addendum)
Follow with Primary MD Webb Silversmith, NP in 2 days   Get CBC, CMP, INR, 2 view Chest X ray checked  by SNF M.D. on Monday tomorrow.   Activity: As tolerated with Full fall precautions use walker/cane & assistance as needed   Disposition SNF     Diet: Renal Low Carb with feeding assistance and aspiration precautions.  For Heart failure patients - Check your Weight same time everyday, if you gain over 2 pounds, or you develop in leg swelling, experience more shortness of breath or chest pain, call your Primary MD immediately. Follow Cardiac Low Salt Diet and 1.5 lit/day fluid restriction.   On your next visit with your primary care physician please Get Medicines reviewed and adjusted.   Please request your Prim.MD to go over all Hospital Tests and Procedure/Radiological results at the follow up, please get all Hospital records sent to your Prim MD by signing hospital release before you go home.   If you experience worsening of your admission symptoms, develop shortness of breath, life threatening emergency, suicidal or homicidal thoughts you must seek medical attention immediately by calling 911 or calling your MD immediately  if symptoms less severe.  You Must read complete instructions/literature along with all the possible adverse reactions/side effects for all the Medicines you take and that have been prescribed to you. Take any new Medicines after you have completely understood and accpet all the possible adverse reactions/side effects.   Do not drive, operating heavy machinery, perform activities at heights, swimming or participation in water activities or provide baby sitting services if your were admitted for syncope or siezures until you have seen by Primary MD or a Neurologist and advised to do so again.  Do not drive when taking Pain medications.    Do not take more than prescribed Pain, Sleep and Anxiety Medications  Special Instructions: If you have smoked or chewed  Tobacco  in the last 2 yrs please stop smoking, stop any regular Alcohol  and or any Recreational drug use.  Wear Seat belts while driving.   Please note  You were cared for by a hospitalist during your hospital stay. If you have any questions about your discharge medications or the care you received while you were in the hospital after you are discharged, you can call the unit and asked to speak with the hospitalist on call if the hospitalist that took care of you is not available. Once you are discharged, your primary care physician will handle any further medical issues. Please note that NO REFILLS for any discharge medications will be authorized once you are discharged, as it is imperative that you return to your primary care physician (or establish a relationship with a primary care physician if you do not have one) for your aftercare needs so that they can reassess your need for medications and monitor your lab values.      Information on my medicine - Coumadin   (Warfarin)  This medication education was reviewed with me or my healthcare representative as part of my discharge preparation.  The pharmacist that spoke with me during my hospital stay was:  Reginia Naas, [facility]  Why was Coumadin prescribed for you? Coumadin was prescribed for you because you have a blood clot or a medical condition that can cause an increased risk of forming blood clots. Blood clots can cause serious health problems by blocking the flow of blood to the heart, lung, or brain. Coumadin can prevent harmful blood clots from forming.  As a reminder your indication for Coumadin is:   Stroke Prevention Because Of Atrial Fibrillation  What test will check on my response to Coumadin? While on Coumadin (warfarin) you will need to have an INR test regularly to ensure that your dose is keeping you in the desired range. The INR (international normalized ratio) number is calculated from the result of the laboratory test  called prothrombin time (PT).  If an INR APPOINTMENT HAS NOT ALREADY BEEN MADE FOR YOU please schedule an appointment to have this lab work done by your health care provider within 7 days. Your INR goal is usually a number between:  2 to 3 or your provider may give you a more narrow range like 2-2.5.  Ask your health care provider during an office visit what your goal INR is.  What  do you need to  know  About  COUMADIN? Take Coumadin (warfarin) exactly as prescribed by your healthcare provider about the same time each day.  DO NOT stop taking without talking to the doctor who prescribed the medication.  Stopping without other blood clot prevention medication to take the place of Coumadin may increase your risk of developing a new clot or stroke.  Get refills before you run out.  What do you do if you miss a dose? If you miss a dose, take it as soon as you remember on the same day then continue your regularly scheduled regimen the next day.  Do not take two doses of Coumadin at the same time.  Important Safety Information A possible side effect of Coumadin (Warfarin) is an increased risk of bleeding. You should call your healthcare provider right away if you experience any of the following: ? Bleeding from an injury or your nose that does not stop. ? Unusual colored urine (red or dark brown) or unusual colored stools (red or black). ? Unusual bruising for unknown reasons. ? A serious fall or if you hit your head (even if there is no bleeding).  Some foods or medicines interact with Coumadin (warfarin) and might alter your response to warfarin. To help avoid this: ? Eat a balanced diet, maintaining a consistent amount of Vitamin K. ? Notify your provider about major diet changes you plan to make. ? Avoid alcohol or limit your intake to 1 drink for women and 2 drinks for men per day. (1 drink is 5 oz. wine, 12 oz. beer, or 1.5 oz. liquor.)  Make sure that ANY health care provider who  prescribes medication for you knows that you are taking Coumadin (warfarin).  Also make sure the healthcare provider who is monitoring your Coumadin knows when you have started a new medication including herbals and non-prescription products.  Coumadin (Warfarin)  Major Drug Interactions  Increased Warfarin Effect Decreased Warfarin Effect  Alcohol (large quantities) Antibiotics (esp. Septra/Bactrim, Flagyl, Cipro) Amiodarone (Cordarone) Aspirin (ASA) Cimetidine (Tagamet) Megestrol (Megace) NSAIDs (ibuprofen, naproxen, etc.) Piroxicam (Feldene) Propafenone (Rythmol SR) Propranolol (Inderal) Isoniazid (INH) Posaconazole (Noxafil) Barbiturates (Phenobarbital) Carbamazepine (Tegretol) Chlordiazepoxide (Librium) Cholestyramine (Questran) Griseofulvin Oral Contraceptives Rifampin Sucralfate (Carafate) Vitamin K   Coumadin (Warfarin) Major Herbal Interactions  Increased Warfarin Effect Decreased Warfarin Effect  Garlic Ginseng Ginkgo biloba Coenzyme Q10 Green tea St. Johns wort    Coumadin (Warfarin) FOOD Interactions  Eat a consistent number of servings per week of foods HIGH in Vitamin K (1 serving =  cup)  Collards (cooked, or boiled & drained) Kale (cooked, or boiled & drained) Mustard greens (cooked, or boiled & drained)  Parsley *serving size only =  cup Spinach (cooked, or boiled & drained) Swiss chard (cooked, or boiled & drained) Turnip greens (cooked, or boiled & drained)  Eat a consistent number of servings per week of foods MEDIUM-HIGH in Vitamin K (1 serving = 1 cup)  Asparagus (cooked, or boiled & drained) Broccoli (cooked, boiled & drained, or raw & chopped) Brussel sprouts (cooked, or boiled & drained) *serving size only =  cup Lettuce, raw (green leaf, endive, romaine) Spinach, raw Turnip greens, raw & chopped   These websites have more information on Coumadin (warfarin):  FailFactory.se; VeganReport.com.au;

## 2014-11-26 NOTE — Progress Notes (Signed)
PATIENT DETAILS Name: Joseph [name] Age: 59 y.o. Sex: male Date of Birth: [birth year] Admit Date: 11/18/2014 Admitting Physician Theodis Blaze, MD VWP:VXYIA, REGINA, NP  Brief narrative:  59 year old male with history of end-stage renal disease on hemodialysis, recent hospitalization and discharge on 7/22 for C. difficile colitis, presented on 7/31 with altered mental status, diabetic ketoacidosis, and significant leukocytosis. Initially, altered mental status was felt to be from toxic metabolic encephalopathy setting of C. difficile with sepsis and DKA. 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 Neurology was subsequently consulted, underwent lumbar puncture on 8/5-CSF did not show any major abnormalities, neurology now has started the patient on high-dose steroids. See below for further details  Subjective:  Patient in bed, denies headache, no chest abdominal pain. Mildly confused. No focal weakness.  Assessment/Plan:   Acute encephalopathy:Initially suspected to be from toxic metabolic encephalopathy from mild DKA, C. difficile colitis. Since continued to have confusion inspite of improvement in blood sugars and C. 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   Seen by neurology underwent LP which was unimpressive. Initially given a trial of acyclovir which was stopped by neurology.   Currently on Dilantin for his seizures along with high-dose IV Solu-Medrol per neurology. HSV PCR -ve in CSF.  Pending CSF serology-CMVLyme studies pending. CSF NMDA Ab and paraneoplastic panel still pending as well.      C. difficile colitis with sepsis: diarrhea improved, continue with oral vancomycin-suspect 10 day course from 7/31 would be suffice.    Atrial fibrillation  with RVR: heart rate much better after adjustment of meds-continue cardizem and metoprolol.  Chads2vasc score of 2, on coumadin/Heparin gtt per pharmacy. Cards following.  Lab Results  Component Value Date   INR 1.39 11/26/2014   INR 1.38 11/25/2014   INR 1.29 11/24/2014     Mildly elevated Troponin:secondary to demand ischemia/RVR/CKD. No further recommendations from cards. On beta blocker continue. This was not ACS.  Elevated LFT's: ?etiology-suspect shock liver from sepsis.USG abd and recently questions mild cirrhosis, Hepatitis serology neg. LFT's downtrending, ferritin level was elevated will have outpatient GI follow-up.  End-stage renal disease: On hemodialysis-Monday, Wednesday and Friday. Nephrology following  Gout: Continue with colcichine  Chronic skin rash: I Discussed his case with his dermatologist Dr. Allyson Sabal who describes this as nonspecific dermatitis. Supportive care only.  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 We have placed him on glucose stabilizer for now,  monitor CBGs.  CBG (last 3)   Recent Labs  11/26/14 0315 11/26/14 0426 11/26/14 0637  GLUCAP 103* 101* 116*    Lab Results  Component Value Date   HGBA1C 9.4* 11/07/2014      Disposition: Remain inpatient-home when mental status improves  Antimicrobial agents  See below  Anti-infectives    Start     Dose/Rate Route Frequency Ordered Stop   11/26/14 1200  vancomycin (VANCOCIN) 50 mg/mL oral solution 500 mg     500 mg Oral 4 times per day 11/26/14 0938 11/30/14 1159   11/22/14 1015  acyclovir (ZOVIRAX) 800 mg in dextrose 5 % 150 mL IVPB  Status:  Discontinued     800 mg 166 mL/hr over 60 Minutes Intravenous Every 12 hours 11/22/14 1001 11/22/14 1009   11/22/14 1015  acyclovir (ZOVIRAX) 400 mg in dextrose 5 %  100 mL IVPB  Status:  Discontinued     400 mg 108 mL/hr over 60 Minutes Intravenous Every 24 hours 11/22/14 1010 11/25/14 0758   11/22/14 1000  acyclovir (ZOVIRAX) 200 MG capsule 200 mg  Status:  Discontinued     200 mg Oral 3 times daily 11/22/14 0826 11/22/14 1000   11/19/14 1400  vancomycin (VANCOCIN) 50 mg/mL oral solution 500 mg     500 mg Oral 4 times per day 11/19/14 1001 11/25/14 1359   11/18/14 1400  vancomycin (VANCOCIN) 125 MG capsule 250 mg  Status:  Discontinued     250 mg Oral 4 times daily 11/18/14 1128 11/18/14 1248   11/18/14 1400  vancomycin (VANCOCIN) 50 mg/mL oral solution 125 mg  Status:  Discontinued     125 mg Oral 4 times per day 11/18/14 1248 11/18/14 1320   11/18/14 1400  vancomycin (VANCOCIN) 50 mg/mL oral solution 250 mg  Status:  Discontinued     250 mg Oral 4 times per day 11/18/14 1320 11/19/14 1001      DVT Prophylaxis: Coumadin/Heparin gtt  Code Status: Full code   Family Communication Spouse    Procedures:  LP. Showing mildly increased protein levels  EEG - recorded two seizures which appear to arise from the left hemisphere as well as signs of irritability with potential for seizure origination in the right temporal region as well.     CONSULTS:  nephrology , Cards, neurology   Time spent 35 minutes-Greater than 50% of this time was spent in counseling, explanation of diagnosis, planning of further management, and coordination of care.  MEDICATIONS: Scheduled Meds: . antiseptic oral rinse  7 mL Mouth Rinse 6 times per day  . calcitRIOL  1.75 mcg Oral Q M,W,F-HD  . clobetasol cream  1 application Topical TID  . colchicine  0.3 mg Oral Once per day on Mon Thu  . diltiazem  30 mg Oral 4 times per day  . feeding supplement (NEPRO CARB STEADY)  237 mL Oral BID BM  . insulin aspart  0-9 Units Subcutaneous 6 times per day  . LORazepam  1 mg Intravenous Once  . methylPREDNISolone (SOLU-MEDROL) injection  1,000 mg Intravenous Daily  .  metoprolol  50 mg Oral BID  . multivitamin  1 tablet Oral QHS  . pantoprazole  20 mg Oral BID  . phenytoin (DILANTIN) IV  100 mg Intravenous 3 times per day  . saccharomyces boulardii  250 mg Oral BID  . sevelamer carbonate  1,600 mg Oral TID WC  . vancomycin  500 mg Oral 4 times per day  . Warfarin - Pharmacist Dosing Inpatient   Does not apply q1800   Continuous Infusions: . sodium chloride 10 mL/hr at 11/18/14 2300  . sodium chloride 10 mL/hr at 11/25/14 1109  . heparin 1,250 Units/hr (11/26/14 0233)   PRN Meds:.sodium chloride, sodium chloride, camphor-menthol, dextrose, feeding supplement (NEPRO CARB STEADY), heparin, heparin, hydrOXYzine, lidocaine (PF), lidocaine-prilocaine, metoprolol, pentafluoroprop-tetrafluoroeth    PHYSICAL EXAM: Vital signs in last 24 hours: Filed Vitals:   11/25/14 0618 11/25/14 1424 11/25/14 2116 11/26/14 0546  BP: 135/77 160/113 143/88 128/85  Pulse: 78 88 65 103  Temp: 98.6 F (37 C) 98.5 F (36.9 C) 98.5 F (36.9 C) 98.4 F (36.9 C)  TempSrc: Oral Oral  Oral Oral  Resp: 18 20 18 18   Height:      Weight:    74 kg (163 lb 2.3 oz)  SpO2: 97% 98% 96% 97%    Weight change:  Filed Weights   11/23/14 0439 11/23/14 0743 11/26/14 0546  Weight: 80.6 kg (177 lb 11.1 oz) 73.4 kg (161 lb 13.1 oz) 74 kg (163 lb 2.3 oz)   Body mass index is 22.12 kg/(m^2).   Gen Exam: Awake/alert-answers few questions appropriately today Neck: Supple, No JVD.   Chest: B/L Clear.   CVS: S1 S2 Regular, no murmurs.  Abdomen: soft, BS +, non tender, non distended.  Extremities: no edema, lower extremities warm to touch. Neurologic: Non Focal.  Skin:papular dry rash all over mostly on the back Wounds: N/A.    Intake/Output from previous day:  Intake/Output Summary (Last 24 hours) at 11/26/14 0938 Last data filed at 11/26/14 0852  Gross per 24 hour  Intake    440 ml  Output    500 ml  Net    -60 ml     LAB RESULTS: CBC  Recent Labs Lab 11/22/14 0511  11/23/14 0532 11/24/14 0352 11/25/14 0514 11/26/14 0240  WBC 13.9* 12.0* 13.6* 12.6* 13.9*  HGB 11.3* 12.3* 11.7* 10.8* 10.4*  HCT 34.2* 36.4* 35.3* 32.5* 30.2*  PLT 142* 139* 136* 111* 113*  MCV 92.4 93.6 94.9 93.9 91.5  MCH 30.5 31.6 31.5 31.2 31.5  MCHC 33.0 33.8 33.1 33.2 34.4  RDW 17.3* 17.5* 17.7* 17.7* 17.4*    Chemistries   Recent Labs Lab 11/21/14 0830 11/22/14 0511 11/23/14 0532 11/24/14 0352 11/25/14 0514  NA 134* 133* 135 137 126*  K 4.0 3.7 4.1 4.0 4.5  CL 99* 97* 96* 98* 91*  CO2 23 24 24 29 26   GLUCOSE 60* 54* 52* 50* 498*  BUN 88* 46* 57* 29* 56*  CREATININE 5.89* 3.97* 4.68* 3.20* 4.40*  CALCIUM 8.0* 7.6* 7.9* 7.7* 7.7*    CBG:  Recent Labs Lab 11/26/14 0124 11/26/14 0227 11/26/14 0315 11/26/14 0426 11/26/14 0637  GLUCAP 146* 110* 103* 101* 116*    GFR Estimated Creatinine Clearance: 18.9 mL/min (by C-G formula based on Cr of 4.4).  Coagulation profile  Recent Labs Lab 11/22/14 0511 11/23/14 0532 11/24/14 0352 11/25/14 0514 11/26/14 0125  INR 1.45 1.31 1.29 1.38 1.39    Cardiac Enzymes  Recent Labs Lab 11/23/14 1615 11/23/14 2112 11/24/14 0352  TROPONINI 0.52* 0.53* 0.51*    Invalid input(s): POCBNP No results for input(s): DDIMER in the last 72 hours. No results for input(s): HGBA1C in the last 72 hours. No results for input(s): CHOL, HDL, LDLCALC, TRIG, CHOLHDL, LDLDIRECT in the last 72 hours. No results for input(s): TSH, T4TOTAL, T3FREE, THYROIDAB in the last 72 hours.  Invalid input(s): FREET3 No results for input(s): VITAMINB12, FOLATE, FERRITIN, TIBC, IRON, RETICCTPCT in the last 72 hours. No results for input(s): LIPASE, AMYLASE in the last 72 hours.  Urine Studies No results for input(s): UHGB, CRYS in the last 72 hours.  Invalid input(s): UACOL, UAPR, USPG, UPH, UTP, UGL, UKET, UBIL, UNIT, UROB, ULEU, UEPI, UWBC, URBC, UBAC, CAST, UCOM, BILUA  MICROBIOLOGY: Recent Results (from the past 240 hour(s))    Blood culture (routine x 2)     Status: None   Collection Time: 11/18/14  6:30 AM  Result Value Ref Range Status   Specimen Description BLOOD LEFT ARM  Final   Special Requests BOTTLES DRAWN AEROBIC AND ANAEROBIC 5CC  Final  Culture NO GROWTH 5 DAYS  Final   Report Status 11/23/2014 FINAL  Final  Blood culture (routine x 2)     Status: None   Collection Time: 11/18/14  6:30 AM  Result Value Ref Range Status   Specimen Description BLOOD LEFT HAND  Final   Special Requests BOTTLES DRAWN AEROBIC AND ANAEROBIC 5CC  Final   Culture NO GROWTH 5 DAYS  Final   Report Status 11/23/2014 FINAL  Final  Urine culture     Status: None   Collection Time: 11/18/14 11:30 AM  Result Value Ref Range Status   Specimen Description URINE, RANDOM  Final   Special Requests NONE  Final   Culture   Final    >=100,000 COLONIES/mL ENTEROCOCCUS SPECIES 30,000 COLONIES/mL ESCHERICHIA COLI    Report Status 11/21/2014 FINAL  Final   Organism ID, Bacteria ENTEROCOCCUS SPECIES  Final   Organism ID, Bacteria ESCHERICHIA COLI  Final      Susceptibility   Escherichia coli - MIC*    AMPICILLIN >=32 RESISTANT Resistant     CEFAZOLIN >=64 RESISTANT Resistant     CEFTRIAXONE <=1 SENSITIVE Sensitive     CIPROFLOXACIN <=0.25 SENSITIVE Sensitive     GENTAMICIN <=1 SENSITIVE Sensitive     IMIPENEM <=0.25 SENSITIVE Sensitive     NITROFURANTOIN <=16 SENSITIVE Sensitive     TRIMETH/SULFA <=20 SENSITIVE Sensitive     AMPICILLIN/SULBACTAM 8 SENSITIVE Sensitive     PIP/TAZO <=4 SENSITIVE Sensitive     * 30,000 COLONIES/mL ESCHERICHIA COLI   Enterococcus species - MIC*    AMPICILLIN <=2 SENSITIVE Sensitive     LEVOFLOXACIN 1 SENSITIVE Sensitive     NITROFURANTOIN <=16 SENSITIVE Sensitive     VANCOMYCIN 2 SENSITIVE Sensitive     LINEZOLID 1 SENSITIVE Sensitive     * >=100,000 COLONIES/mL ENTEROCOCCUS SPECIES  MRSA PCR Screening     Status: None   Collection Time: 11/18/14 11:30 AM  Result Value Ref Range Status    MRSA by PCR NEGATIVE NEGATIVE Final    Comment:        The GeneXpert MRSA Assay (FDA approved for NASAL specimens only), is one component of a comprehensive MRSA colonization surveillance program. It is not intended to diagnose MRSA infection nor to guide or monitor treatment for MRSA infections.   Clostridium Difficile by PCR (not at [facility])     Status: None   Collection Time: 11/19/14  3:50 PM  Result Value Ref Range Status   Toxigenic C Difficile by pcr NEGATIVE NEGATIVE Final  CSF culture with Stat gram stain     Status: None (Preliminary result)   Collection Time: 11/23/14  4:30 PM  Result Value Ref Range Status   Specimen Description CSF  Final   Special Requests NONE  Final   Gram Stain   Final    WBC PRESENT, PREDOMINANTLY MONONUCLEAR NO ORGANISMS SEEN CYTOSPIN    Culture NO GROWTH 2 DAYS  Final   Report Status PENDING  Incomplete    RADIOLOGY STUDIES/RESULTS: Dg Chest 2 View  11/18/2014   CLINICAL DATA:  Altered mental status. Shortness of breath and nausea today.  EXAM: CHEST  2 VIEW  COMPARISON:  11/05/2014  FINDINGS: Chronic cardiomegaly and vascular congestion, unchanged. Diminished right pleural effusion and basilar opacity from prior exam. No new confluent airspace disease or pneumothorax. No acute osseous abnormalities.  IMPRESSION: 1. Diminished right pleural effusion and right basilar opacity. 2. Stable chronic cardiomegaly and vascular congestion.   Electronically Signed   By:  Jeb Levering M.D.   On: 11/18/2014 06:21   Dg Chest 2 View  11/05/2014   CLINICAL DATA:  Dyspnea for 1 day  EXAM: CHEST  2 VIEW  COMPARISON:  July 03, 2014  FINDINGS: The heart size and mediastinal contours are stable. There is mild patchy consolidation of lateral right lung base with small right pleural effusion. There is mild chronic central pulmonary vascular congestion. The visualized skeletal structures are stable.  IMPRESSION: Chronic central pulmonary vascular congestion.  Small  right pleural effusion with associated mild patchy opacity of right lung base which could be due to atelectasis but superimposed pneumonia is not excluded.   Electronically Signed   By: Abelardo Diesel M.D.   On: 11/05/2014 21:54   Ct Head Wo Contrast  11/18/2014   CLINICAL DATA:  Altered mental status.  EXAM: CT HEAD WITHOUT CONTRAST  TECHNIQUE: Contiguous axial images were obtained from the base of the skull through the vertex without intravenous contrast.  COMPARISON:  None.  FINDINGS: No intracranial hemorrhage, mass effect, or midline shift. Small focal encephalomalacia in the left occipital lobe. No hydrocephalus. The basilar cisterns are patent. No evidence of territorial infarct. No intracranial fluid collection. Calvarium is intact. Included paranasal sinuses and mastoid air cells are well aerated.  IMPRESSION: 1.  No acute intracranial abnormality. 2. Small focal encephalomalacia in the left occipital lobe.   Electronically Signed   By: Jeb Levering M.D.   On: 11/18/2014 06:13   Mr Brain Wo Contrast  11/21/2014   CLINICAL DATA:  Altered mental status. Encephalopathy. Atrial fibrillation.  EXAM: MRI HEAD WITHOUT CONTRAST  TECHNIQUE: Multiplanar, multiecho pulse sequences of the brain and surrounding structures were obtained without intravenous contrast.  COMPARISON:  CT head without contrast from the same day.  FINDINGS: Restricted diffusion and increased T2 signal is present within the medial temporal lobes and hippocampal structures bilaterally. Inferior temporal lobes are intact.  Moderate generalized atrophy and diffuse periventricular T2 changes bilaterally are advanced for age. The ventricles are of normal size.  Flow is present in the major intracranial arteries. The globes and orbits are intact. The paranasal sinuses are clear. There is some fluid in the right mastoid air cells. No obstructing nasopharyngeal lesion is evident.  IMPRESSION: Abnormal T2 signal and restricted diffusion within  the medial temporal lobes and hippocampus bilaterally. 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 There is no hemorrhage or signal change along the undersurface the temporal lobes, but herpes encephalitis is also considered. Status epilepticus is also on the differential diagnosis.  Atrophy in T2 signal changes are advanced for age. This is nonspecific, but likely reflects the sequela of chronic microvascular ischemia.  These results will be called to the ordering clinician or representative by the Radiologist Assistant, and communication documented in the PACS or zVision Dashboard.   Electronically Signed   By: San Morelle M.D.   On: 11/21/2014 19:01   US Abdomen Complete  11/06/2014   CLINICAL DATA:  Abnormal liver function tests  EXAM: ULTRASOUND ABDOMEN COMPLETE  COMPARISON:  None.  FINDINGS: Gallbladder: No gallstones or wall thickening visualized. No sonographic Murphy sign noted.  Common bile duct: Diameter: 4 mm  Liver: Minimally nodular contour with no focal abnormalities.  IVC: No abnormality visualized.  Pancreas: Visualized portion unremarkable.  Spleen: Size and appearance within normal limits.  Right Kidney: Length: 10.7 cm. Midpole cyst measuring 14 mm. Mildly increased echogenicity.  Left Kidney: Length: 10.8 cm. Lower pole cyst measures 1 cm. Mildly  increased echogenicity.  Abdominal aorta: No aneurysm identified. Limited visualization distally due to bowel gas.  Other findings: Right pleural effusion.  Small volume ascites.  IMPRESSION: Mildly nodular liver contour with small volume of ascites and right pleural effusion. Possibility of cirrhosis not excluded.  Evidence of medical renal disease.   Electronically Signed   By: Skipper Cliche M.D.   On: 11/06/2014 21:38    Thurnell Lose, MD  [facility] Pager:[phone]  If 7PM-7AM, please contact night-coverage [email or web address] Password [email or web address] 11/26/2014, 9:38 AM   LOS: 8 days

## 2014-11-26 NOTE — Progress Notes (Signed)
[facility] Progress Note   Subjective: pleasant, partially oriented  Filed Vitals:   11/25/14 0618 11/25/14 1424 11/25/14 2116 11/26/14 0546  BP: 135/77 160/113 143/88 128/85  Pulse: 78 88 65 103  Temp: 98.6 F (37 C) 98.5 F (36.9 C) 98.5 F (36.9 C) 98.4 F (36.9 C)  TempSrc: Oral Oral Oral Oral  Resp: 18 20 18 18   Height:      Weight:    74 kg (163 lb 2.3 oz)  SpO2: 97% 98% 96% 97%   Exam: Alert, no distress, calm Rash over back w pustular changes No jvd Chest clear bilat RRR no MRG Abd soft ntnd 1+ ascites, no hsm No LE or UE edema L arm AVG +bruit Neuro follows commands, knows place not year, knows "Olympics" are on  [place] MWF  80kg   3/2.5 bath   P2  AVG L arm  Heparin 6100   Calcitriol 1.75 (no Fe/ esa) Last tsat 42%, ferr 1122, pth 427, Hb 11.6      Assessment: 1. AMS/ encephalopathy - poss encephalitis, per neuro on antiseizure/ +steroids, CSF neg for herpes virus 2. Cdif infection/ diarrhea 3. DKA on admit ,resolved 4. ESRD MWF HD 5. Volume no vol excess, lost body wt 6. UTI s/p fosfomycin x 1 7. Anemia Hb up, no esa 8. MBD vit D/ binders 9. ^LFT's/ NH3 - possible cirrhosis on imaging 10. HTN stable 11. Afib on hep/ coumadin/ dilt / MTP  Plan - HD today    Kelly Splinter MD  pager [phone]    cell [phone]  11/26/2014, 9:11 AM     Recent Labs Lab 11/21/14 0800  11/22/14 0511 11/23/14 0532 11/24/14 0352 11/25/14 0514  NA  --   < > 133* 135 137 126*  K  --   < > 3.7 4.1 4.0 4.5  CL  --   < > 97* 96* 98* 91*  CO2  --   < > 24 24 29 26   GLUCOSE  --   < > 54* 52* 50* 498*  BUN  --   < > 46* 57* 29* 56*  CREATININE  --   < > 3.97* 4.68* 3.20* 4.40*  CALCIUM  --   < > 7.6* 7.9* 7.7* 7.7*  PHOS 6.1*  --  4.1 4.5  --   --   < > = values in this interval not displayed.  Recent Labs Lab 11/23/14 0532 11/24/14 0352 11/25/14 0514  AST 127* 107* 86*  ALT 169* 136* 119*  ALKPHOS 209* 208* 191*  BILITOT 1.5* 1.5* 1.2  PROT  6.3* 5.7* 5.5*  ALBUMIN 3.2*  3.2* 2.9* 2.8*    Recent Labs Lab 11/24/14 0352 11/25/14 0514 11/26/14 0240  WBC 13.6* 12.6* 13.9*  HGB 11.7* 10.8* 10.4*  HCT 35.3* 32.5* 30.2*  MCV 94.9 93.9 91.5  PLT 136* 111* 113*   . antiseptic oral rinse  7 mL Mouth Rinse 6 times per day  . calcitRIOL  1.75 mcg Oral Q M,W,F-HD  . clobetasol cream  1 application Topical TID  . colchicine  0.3 mg Oral Once per day on Mon Thu  . diltiazem  30 mg Oral 4 times per day  . feeding supplement (NEPRO CARB STEADY)  237 mL Oral BID BM  . insulin aspart  0-9 Units Subcutaneous 6 times per day  . LORazepam  1 mg Intravenous Once  . methylPREDNISolone (SOLU-MEDROL) injection  1,000 mg Intravenous Daily  . metoprolol  50 mg Oral BID  .  multivitamin  1 tablet Oral QHS  . pantoprazole  20 mg Oral BID  . phenytoin (DILANTIN) IV  100 mg Intravenous 3 times per day  . saccharomyces boulardii  250 mg Oral BID  . sevelamer carbonate  1,600 mg Oral TID WC  . Warfarin - Pharmacist Dosing Inpatient   Does not apply q1800   . sodium chloride 10 mL/hr at 11/18/14 2300  . sodium chloride 10 mL/hr at 11/25/14 1109  . heparin 1,250 Units/hr (11/26/14 0233)   sodium chloride, sodium chloride, camphor-menthol, dextrose, feeding supplement (NEPRO CARB STEADY), heparin, heparin, hydrOXYzine, lidocaine (PF), lidocaine-prilocaine, metoprolol, pentafluoroprop-tetrafluoroeth

## 2014-11-26 NOTE — Progress Notes (Signed)
ANTICOAGULATION CONSULT NOTE - Follow Up Consult  Pharmacy Consult for Heparin  Indication: atrial fibrillation  Allergies  Allergen Reactions  . Dilaudid [Hydromorphone] Nausea And Vomiting   Patient Measurements: Height: 6' (182.9 cm) Weight: 161 lb 13.1 oz (73.4 kg) (stood to scale ) IBW/kg (Calculated) : 77.6  Vital Signs: Temp: 98.5 F (36.9 C) (08/07 2116) Temp Source: Oral (08/07 2116) BP: 143/88 mmHg (08/07 2116) Pulse Rate: 65 (08/07 2116)  Labs:  Recent Labs  11/23/14 0532 11/23/14 1615 11/23/14 2112 11/24/14 0352  11/25/14 0514 11/25/14 1623 11/26/14 0125  HGB 12.3*  --   --  11.7*  --  10.8*  --   --   HCT 36.4*  --   --  35.3*  --  32.5*  --   --   PLT 139*  --   --  136*  --  111*  --   --   LABPROT 16.4*  --   --  16.3*  --  17.1*  --  17.1*  INR 1.31  --   --  1.29  --  1.38  --  1.39  HEPARINUNFRC  --   --   --   --   < > 0.81* 2.00* 0.11*  CREATININE 4.68*  --   --  3.20*  --  4.40*  --   --   TROPONINI  --  0.52* 0.53* 0.51*  --   --   --   --   < > = values in this interval not displayed.  Estimated Creatinine Clearance: 18.8 mL/min (by C-G formula based on Cr of 4.4).  Assessment: Heparin level sub-therapeutic at 0.11 after rate decrease, no issues per RN.   Goal of Therapy:  Heparin level 0.3-0.7 units/ml Monitor platelets by anticoagulation protocol: Yes   Plan:  -Increase heparin to 1250 units/hr -1100 HL -Daily CBC/HL -Monitor for bleeding  Narda Bonds 11/26/2014,2:19 AM

## 2014-11-26 NOTE — Procedures (Addendum)
RIJV tunneled PICC 21 cm No comp/EBL

## 2014-11-26 NOTE — Progress Notes (Addendum)
Patient in Dialysis today 11/26/14 EEG to be completed 11/27/14 after dialysis. Nurse is aware.

## 2014-11-26 NOTE — Progress Notes (Signed)
Subjective: Patietn currently in dialysis.  He remains confused.  He believes he is in the out patient dialysis unit.  No complaints. NA 125 today  Objective: Current vital signs: BP 148/86 mmHg  Pulse 92  Temp(Src) 98.2 F (36.8 C) (Oral)  Resp 18  Ht 6' (1.829 m)  Wt 86 kg (189 lb 9.5 oz)  BMI 25.71 kg/m2  SpO2 98% Vital signs in last 24 hours: Temp:  [98.2 F (36.8 C)-98.5 F (36.9 C)] 98.2 F (36.8 C) (08/08 0920) Pulse Rate:  [65-103] 92 (08/08 1100) Resp:  [18-20] 18 (08/08 0920) BP: (121-160)/(75-113) 148/86 mmHg (08/08 1100) SpO2:  [96 %-98 %] 98 % (08/08 0920) Weight:  [74 kg (163 lb 2.3 oz)-86 kg (189 lb 9.5 oz)] 86 kg (189 lb 9.5 oz) (08/08 0920)  Intake/Output from previous day: 08/07 0701 - 08/08 0700 In: 440 [P.O.:440] Out: 500 [Urine:500] Intake/Output this shift:   Nutritional status: Diet NPO time specified Except for: Sips with Meds  Neurologic Exam: General: Mental Status: Alert, not oriented to place, date, year or his age.  Speech fluent without evidence of aphasia.  Able to follow 3 step commands without difficulty. Cranial Nerves: II: Discs flat bilaterally; Visual fields grossly normal, pupils equal, round, reactive to light and accommodation III,IV, VI: ptosis not present, extra-ocular motions intact bilaterally V,VII: smile symmetric, facial light touch sensation normal bilaterally VIII: hearing normal bilaterally IX,X: uvula rises symmetrically XI: bilateral shoulder shrug XII: midline tongue extension without atrophy or fasciculations  Motor: Right : Upper extremity   5/5    Left:     Upper extremity   5/5  Lower extremity   5/5     Lower extremity   5/5 Tone and bulk:normal tone throughout; no atrophy noted Sensory: Pinprick and light touch intact throughout, bilaterally Deep Tendon Reflexes:  Right: Upper Extremity   Left: Upper extremity   biceps (C-5 to C-6) 2/4   biceps (C-5 to C-6) 2/4 tricep (C7) 2/4    triceps (C7)  2/4 Brachioradialis (C6) 2/4  Brachioradialis (C6) 2/4  Lower Extremity Lower Extremity  quadriceps (L-2 to L-4) 2/4   quadriceps (L-2 to L-4) 2/4 Achilles (S1) 2/4   Achilles (S1) 2/4  Plantars: Right: downgoing   Left: downgoing   Lab Results: Basic Metabolic Panel:  Recent Labs Lab 11/21/14 0800  11/22/14 0511 11/23/14 0532 11/24/14 0352 11/25/14 0514 11/26/14 0951  NA  --   < > 133* 135 137 126* 125*  K  --   < > 3.7 4.1 4.0 4.5 4.3  CL  --   < > 97* 96* 98* 91* 90*  CO2  --   < > 24 24 29 26 22   GLUCOSE  --   < > 54* 52* 50* 498* 146*  BUN  --   < > 46* 57* 29* 56* 82*  CREATININE  --   < > 3.97* 4.68* 3.20* 4.40* 5.00*  CALCIUM  --   < > 7.6* 7.9* 7.7* 7.7* 8.0*  PHOS 6.1*  --  4.1 4.5  --   --  5.0*  < > = values in this interval not displayed.  Liver Function Tests:  Recent Labs Lab 11/20/14 1749 11/20/14 1102  11/22/14 0511 11/23/14 0532 11/24/14 0352 11/25/14 0514 11/26/14 0951  AST 178* 161*  --   --  127* 107* 86*  --   ALT 232* 230*  --   --  169* 136* 119*  --   ALKPHOS 181* 180*  --   --  209* 208* 191*  --   BILITOT 1.3* 1.5*  --   --  1.5* 1.5* 1.2  --   PROT 5.8* 6.0*  --   --  6.3* 5.7* 5.5*  --   ALBUMIN 3.0* 3.3*  < > 2.9* 3.2*  3.2* 2.9* 2.8* 2.7*  < > = values in this interval not displayed. No results for input(s): LIPASE, AMYLASE in the last 168 hours.  Recent Labs Lab 11/20/14 1102  AMMONIA 44*    CBC:  Recent Labs Lab 11/22/14 0511 11/23/14 0532 11/24/14 0352 11/25/14 0514 11/26/14 0240  WBC 13.9* 12.0* 13.6* 12.6* 13.9*  HGB 11.3* 12.3* 11.7* 10.8* 10.4*  HCT 34.2* 36.4* 35.3* 32.5* 30.2*  MCV 92.4 93.6 94.9 93.9 91.5  PLT 142* 139* 136* 111* 113*    Cardiac Enzymes:  Recent Labs Lab 11/23/14 1615 11/23/14 2112 11/24/14 0352  TROPONINI 0.52* 0.53* 0.51*    Lipid Panel: No results for input(s): CHOL, TRIG, HDL, CHOLHDL, VLDL, LDLCALC in the last 168 hours.  CBG:  Recent Labs Lab 11/26/14 0124  11/26/14 0227 11/26/14 0315 11/26/14 0426 11/26/14 0637  GLUCAP 146* 110* 103* 101* 116*    Microbiology: Results for orders placed or performed during the hospital encounter of 11/18/14  Blood culture (routine x 2)     Status: None   Collection Time: 11/18/14  6:30 AM  Result Value Ref Range Status   Specimen Description BLOOD LEFT ARM  Final   Special Requests BOTTLES DRAWN AEROBIC AND ANAEROBIC 5CC  Final   Culture NO GROWTH 5 DAYS  Final   Report Status 11/23/2014 FINAL  Final  Blood culture (routine x 2)     Status: None   Collection Time: 11/18/14  6:30 AM  Result Value Ref Range Status   Specimen Description BLOOD LEFT HAND  Final   Special Requests BOTTLES DRAWN AEROBIC AND ANAEROBIC 5CC  Final   Culture NO GROWTH 5 DAYS  Final   Report Status 11/23/2014 FINAL  Final  Urine culture     Status: None   Collection Time: 11/18/14 11:30 AM  Result Value Ref Range Status   Specimen Description URINE, RANDOM  Final   Special Requests NONE  Final   Culture   Final    >=100,000 COLONIES/mL ENTEROCOCCUS SPECIES 30,000 COLONIES/mL ESCHERICHIA COLI    Report Status 11/21/2014 FINAL  Final   Organism ID, Bacteria ENTEROCOCCUS SPECIES  Final   Organism ID, Bacteria ESCHERICHIA COLI  Final      Susceptibility   Escherichia coli - MIC*    AMPICILLIN >=32 RESISTANT Resistant     CEFAZOLIN >=64 RESISTANT Resistant     CEFTRIAXONE <=1 SENSITIVE Sensitive     CIPROFLOXACIN <=0.25 SENSITIVE Sensitive     GENTAMICIN <=1 SENSITIVE Sensitive     IMIPENEM <=0.25 SENSITIVE Sensitive     NITROFURANTOIN <=16 SENSITIVE Sensitive     TRIMETH/SULFA <=20 SENSITIVE Sensitive     AMPICILLIN/SULBACTAM 8 SENSITIVE Sensitive     PIP/TAZO <=4 SENSITIVE Sensitive     * 30,000 COLONIES/mL ESCHERICHIA COLI   Enterococcus species - MIC*    AMPICILLIN <=2 SENSITIVE Sensitive     LEVOFLOXACIN 1 SENSITIVE Sensitive     NITROFURANTOIN <=16 SENSITIVE Sensitive     VANCOMYCIN 2 SENSITIVE Sensitive      LINEZOLID 1 SENSITIVE Sensitive     * >=100,000 COLONIES/mL ENTEROCOCCUS SPECIES  MRSA PCR Screening     Status: None   Collection Time: 11/18/14 11:30 AM  Result Value Ref  Range Status   MRSA by PCR NEGATIVE NEGATIVE Final    Comment:        The GeneXpert MRSA Assay (FDA approved for NASAL specimens only), is one component of a comprehensive MRSA colonization surveillance program. It is not intended to diagnose MRSA infection nor to guide or monitor treatment for MRSA infections.   Clostridium Difficile by PCR (not at [facility])     Status: None   Collection Time: 11/19/14  3:50 PM  Result Value Ref Range Status   Toxigenic C Difficile by pcr NEGATIVE NEGATIVE Final  CSF culture with Stat gram stain     Status: None (Preliminary result)   Collection Time: 11/23/14  4:30 PM  Result Value Ref Range Status   Specimen Description CSF  Final   Special Requests NONE  Final   Gram Stain   Final    WBC PRESENT, PREDOMINANTLY MONONUCLEAR NO ORGANISMS SEEN CYTOSPIN    Culture NO GROWTH 2 DAYS  Final   Report Status PENDING  Incomplete    Coagulation Studies:  Recent Labs  11/24/14 0352 11/25/14 0514 11/26/14 0125  LABPROT 16.3* 17.1* 17.1*  INR 1.29 1.38 1.39    Imaging: No results found.  Medications:  Scheduled: . antiseptic oral rinse  7 mL Mouth Rinse 6 times per day  . calcitRIOL  1.75 mcg Oral Q M,W,F-HD  . clobetasol cream  1 application Topical TID  . colchicine  0.3 mg Oral Once per day on Mon Thu  . diltiazem  30 mg Oral 4 times per day  . feeding supplement (NEPRO CARB STEADY)  237 mL Oral BID BM  . insulin aspart  0-9 Units Subcutaneous 6 times per day  . LORazepam  1 mg Intravenous Once  . methylPREDNISolone (SOLU-MEDROL) injection  1,000 mg Intravenous Daily  . metoprolol  50 mg Oral BID  . multivitamin  1 tablet Oral QHS  . pantoprazole  20 mg Oral BID  . phenytoin (DILANTIN) IV  100 mg Intravenous 3 times per day  . saccharomyces boulardii  250 mg  Oral BID  . sevelamer carbonate  1,600 mg Oral TID WC  . vancomycin  500 mg Oral 4 times per day  . Warfarin - Pharmacist Dosing Inpatient   Does not apply q1800    Assessment/Plan: [name] with AMS in the setting of C. Diff, hyperglycemia, hyponatremia and elevated WBC. Exam is non-focal other than confusion.EEG shows intermittent seizure activity and patient was started on dilantin.No current seizure activity noted. MRI also shows abnormal T2 signal and restricted diffusion within the medial temporal lobes and hippocampus bilaterally. 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 HSV is negative.  He is on day 3/5 solumedrol.   Recommend: 1) repeat EEG 2) repeat MRI 3) Pending CSF studies:  Lyme ab, CMV, VZV, EBV, paraneoplastic antibodies   Etta Quill PA-C [facility] [phone]  11/26/2014, 11:12 AM  I personally participated in this patient's evaluation and management, including formulating the above clinical impression and management recommendations.  Rush Farmer M.D. [facility] [phone]

## 2014-11-26 NOTE — Progress Notes (Addendum)
ANTICOAGULATION CONSULT NOTE - Follow Up Consult  Pharmacy Consult for Coumadin bridged with heparin Indication: Afib  Allergies  Allergen Reactions  . Dilaudid [Hydromorphone] Nausea And Vomiting    Patient Measurements: Height: 6' (182.9 cm) Weight: 189 lb 9.5 oz (86 kg) (discrempacie noted/ weight pt three times) IBW/kg (Calculated) : 77.6  Vital Signs: Temp: 98.2 F (36.8 C) (08/08 0920) Temp Source: Oral (08/08 0920) BP: 132/89 mmHg (08/08 1300) Pulse Rate: 96 (08/08 1300)  Labs:  Recent Labs  11/23/14 1615 11/23/14 2112  11/24/14 0352  11/25/14 0514 11/25/14 1623 11/26/14 0125 11/26/14 0240 11/26/14 0951 11/26/14 1130  HGB  --   --   < > 11.7*  --  10.8*  --   --  10.4*  --   --   HCT  --   --   --  35.3*  --  32.5*  --   --  30.2*  --   --   PLT  --   --   --  136*  --  111*  --   --  113*  --   --   LABPROT  --   --   --  16.3*  --  17.1*  --  17.1*  --   --   --   INR  --   --   --  1.29  --  1.38  --  1.39  --   --   --   HEPARINUNFRC  --   --   --   --   < > 0.81* 2.00* 0.11*  --   --  0.43  CREATININE  --   --   --  3.20*  --  4.40*  --   --   --  5.00*  --   TROPONINI 0.52* 0.53*  --  0.51*  --   --   --   --   --   --   --   < > = values in this interval not displayed.  Estimated Creatinine Clearance: 17.5 mL/min (by C-G formula based on Cr of 5).  Assessment: 59 year old male admitted 11/18/2014 with altered mental status. On warfarin PTA for afib and supratherapeutic INR on admission 3.64>6.53. Given Vit K 5 mg x1 and INR now decreased to 1.31. S/p LP - Heparin resumed 8/5 @1800 , Coumadin resumed 8/6  Last HL therapeutic at 0.43 and INR this am remains subtherapeutic at 1.39. Hgb has slight trend down to 10.8. No bleeding or oozing per RN.  Pt also loaded with phenytoin on 8/4 for seizure. Level on 8/5 after load was 12.5. 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 Pt remains confused. HSV negative. Now day  #3/5 of steroids. No seizure activity.  Goal of Therapy:  INR 2-3 Heparin level 0.3-0.7 units/ml Monitor platelets by anticoagulation protocol: Yes   Plan:  Continue heparin gtt at 1250 units/hr Give Coumadin 7.5mg  PO x 1 tonight Check confirmatory HL tonight Monitor daily HL, INR, CBC, s/s of bleed  Continue phenytoin 100mg  IV Q8 Check phenytoin level tomorrow am  BATCHELDER,NATHAN J 11/26/2014,1:24 PM  ADDN: F/u HL is now subtherapeutic on heparin 1250 units/hr. RN reports that heparin was turned off for approximately 9h and was restarted at 1730 due to trips to dialysis and MRI. Will continue current rate and recheck HL in the AM.  Andrey Cota. Diona Foley, PharmD Clinical Pharmacist Pager [phone]

## 2014-11-26 NOTE — Progress Notes (Signed)
MD notified twice regarding concerns related to insulin drip discontinuation. MD stated no basal acting to be given prior to discontinuation. Will continue to monitor patient.

## 2014-11-27 ENCOUNTER — Inpatient Hospital Stay (HOSPITAL_COMMUNITY): Payer: Medicare Other

## 2014-11-27 DIAGNOSIS — G0490 Encephalitis and encephalomyelitis, unspecified: Secondary | ICD-10-CM | POA: Insufficient documentation

## 2014-11-27 LAB — CSF CULTURE W GRAM STAIN: Culture: NO GROWTH

## 2014-11-27 LAB — CBC
HCT: 31.7 % — ABNORMAL LOW (ref 39.0–52.0)
HEMOGLOBIN: 10.5 g/dL — AB (ref 13.0–17.0)
MCH: 30.8 pg (ref 26.0–34.0)
MCHC: 33.1 g/dL (ref 30.0–36.0)
MCV: 93 fL (ref 78.0–100.0)
Platelets: 114 10*3/uL — ABNORMAL LOW (ref 150–400)
RBC: 3.41 MIL/uL — ABNORMAL LOW (ref 4.22–5.81)
RDW: 17.8 % — ABNORMAL HIGH (ref 11.5–15.5)
WBC: 10.3 10*3/uL (ref 4.0–10.5)

## 2014-11-27 LAB — GLUCOSE, CAPILLARY
GLUCOSE-CAPILLARY: 269 mg/dL — AB (ref 65–99)
Glucose-Capillary: 215 mg/dL — ABNORMAL HIGH (ref 65–99)
Glucose-Capillary: 229 mg/dL — ABNORMAL HIGH (ref 65–99)
Glucose-Capillary: 313 mg/dL — ABNORMAL HIGH (ref 65–99)
Glucose-Capillary: 344 mg/dL — ABNORMAL HIGH (ref 65–99)
Glucose-Capillary: 348 mg/dL — ABNORMAL HIGH (ref 65–99)

## 2014-11-27 LAB — VARICELLA-ZOSTER BY PCR: VARICELLA-ZOSTER, PCR: NEGATIVE

## 2014-11-27 LAB — CSF CULTURE

## 2014-11-27 LAB — PROTIME-INR
INR: 2.43 — ABNORMAL HIGH (ref 0.00–1.49)
PROTHROMBIN TIME: 26.1 s — AB (ref 11.6–15.2)

## 2014-11-27 LAB — PHENYTOIN LEVEL, TOTAL
Phenytoin Lvl: 5.8 ug/mL — ABNORMAL LOW (ref 10.0–20.0)
Phenytoin Lvl: 5.8 ug/mL — ABNORMAL LOW (ref 10.0–20.0)

## 2014-11-27 LAB — CMV (CYTOMEGALOVIRUS) DNA ULTRAQUANT, PCR
CMV DNA Quant: POSITIVE IU/mL
LOG10 CMV QN DNA PL: UNDETERMINED {Log_IU}/mL

## 2014-11-27 LAB — EPSTEIN BARR VRS(EBV DNA BY PCR)
EBV DNA QN BY PCR: NEGATIVE {copies}/mL
LOG10 EBV DNA QN PCR: UNDETERMINED {Log_copies}/mL

## 2014-11-27 LAB — HEPARIN LEVEL (UNFRACTIONATED): Heparin Unfractionated: 0.47 IU/mL (ref 0.30–0.70)

## 2014-11-27 MED ORDER — IOHEXOL 300 MG/ML  SOLN
25.0000 mL | INTRAMUSCULAR | Status: AC
Start: 2014-11-27 — End: 2014-11-27
  Administered 2014-11-27 (×2): 25 mL via ORAL

## 2014-11-27 NOTE — Progress Notes (Signed)
[facility] Progress Note   Subjective: pleasant, still disoriented but more alert altogether. Itching of back rash is gone, not getting new lesions per pt  Filed Vitals:   11/26/14 2151 11/27/14 0104 11/27/14 0505 11/27/14 1117  BP: 114/62 133/76 125/78 138/78  Pulse: 96 84 85 90  Temp: 99.5 F (37.5 C) 98.5 F (36.9 C) 97.8 F (36.6 C) 98.7 F (37.1 C)  TempSrc: Oral Oral Oral Oral  Resp: 16 16 18 18   Height:      Weight:      SpO2: 100% 96% 98% 95%   Exam: Alert, no distress, calm Rash over back, pustular changes are improving, not seeing new lesions No jvd Chest clear bilat RRR no MRG Abd soft ntnd 1+ ascites, no hsm No LE or UE edema L arm AVG +bruit Neuro alert, nf, Ox 2  [place] MWF  80kg   3/2.5 bath   P2  AVG L arm  Heparin 6100   Calcitriol 1.75 (no Fe/ esa) Last tsat 42%, ferr 1122, pth 427, Hb 11.6      Assessment: 1. 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 Cdif infection/ diarrhea 3. DKA on admit ,resolved 4. ESRD MWF HD 5. Volume no vol excess, lost body wt 6. UTI s/p fosfomycin x 1 7. Anemia Hb up, no esa 8. MBD vit D/ binders 9. ^LFT's/ NH3 - possible cirrhosis on imaging 10. Hx etoh abuse 11. HTN stable 12. Afib on hep/ coumadin/ dilt / MTP  Plan - HD tomorrow    Kelly Splinter MD  pager [phone]    cell [phone]  11/27/2014, 4:13 PM     Recent Labs Lab 11/22/14 0511 11/23/14 0532 11/24/14 0352 11/25/14 0514 11/26/14 0951  NA 133* 135 137 126* 125*  K 3.7 4.1 4.0 4.5 4.3  CL 97* 96* 98* 91* 90*  CO2 24 24 29 26 22   GLUCOSE 54* 52* 50* 498* 146*  BUN 46* 57* 29* 56* 82*  CREATININE 3.97* 4.68* 3.20* 4.40* 5.00*  CALCIUM 7.6* 7.9* 7.7* 7.7* 8.0*  PHOS 4.1 4.5  --   --  5.0*    Recent Labs Lab 11/23/14 0532 11/24/14 0352 11/25/14 0514 11/26/14 0951  AST 127* 107* 86*  --    ALT 169* 136* 119*  --   ALKPHOS 209* 208* 191*  --   BILITOT 1.5* 1.5* 1.2  --   PROT 6.3* 5.7* 5.5*  --   ALBUMIN 3.2*  3.2* 2.9* 2.8* 2.7*    Recent Labs Lab 11/25/14 0514 11/26/14 0240 11/27/14 0723  WBC 12.6* 13.9* 10.3  HGB 10.8* 10.4* 10.5*  HCT 32.5* 30.2* 31.7*  MCV 93.9 91.5 93.0  PLT 111* 113* 114*   . antiseptic oral rinse  7 mL Mouth Rinse 6 times per day  . calcitRIOL  1.75 mcg Oral Q M,W,F-HD  . clobetasol cream  1 application Topical TID  . colchicine  0.3 mg Oral Once per day on Mon Thu  . diltiazem  30 mg Oral 4 times per day  . feeding supplement (NEPRO CARB STEADY)  237 mL Oral BID BM  . insulin aspart  0-9 Units Subcutaneous 6 times per day  . LORazepam  1 mg Intravenous Once  . methylPREDNISolone (SOLU-MEDROL) injection  1,000 mg Intravenous Daily  . metoprolol  50 mg Oral BID  . multivitamin  1 tablet Oral QHS  . pantoprazole  20 mg Oral BID  . phenytoin (DILANTIN) IV  100 mg Intravenous 3 times per day  . saccharomyces boulardii  250 mg Oral BID  . sevelamer carbonate  1,600 mg Oral TID WC  . vancomycin  500 mg Oral 4 times per day  . Warfarin - Pharmacist Dosing Inpatient   Does not apply q1800   . sodium chloride 10 mL/hr at 11/18/14 2300  . sodium chloride 10 mL/hr at 11/27/14 0541   sodium chloride, sodium chloride, camphor-menthol, dextrose, feeding supplement (NEPRO CARB STEADY), heparin, heparin, hydrOXYzine, lidocaine (PF), lidocaine-prilocaine, metoprolol, pentafluoroprop-tetrafluoroeth

## 2014-11-27 NOTE — Procedures (Signed)
EEG report.  Brief clinical history:[name] with AMS in the setting of C. Diff, hyperglycemia, hyponatremia and elevated WBC. Exam is non-focal other than confusion.EEG shows intermittent seizure activity and patient was started on dilantin.No current seizure activity noted. MRI also shows abnormal T2 signal and restricted diffusion within the medial temporal lobes and hippocampus bilaterally. 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 HSV is negative. He is on day 3/5 solumedrol.  Technique: this is a 17 channel routine scalp EEG performed at the bedside with bipolar and monopolar montages arranged in accordance to the international 10/20 system of electrode placement. One channel was dedicated to EKG recording.  The study was performed predominantly during sleep, with only a very brief period of wakefulness  Hyperventilation and intermittent photic stimulation were utilized as activating procedures.  Description: patient seems to be asleep as the study begins and throughout the vast majority of the recording. Nonetheless, he is able to achieve a very brief period of wakefulness during which he is able to achieve a 7 Hz background rhythm. No electrographic seizueres noted.    No focal or generalized epileptiform discharges noted.  EKG showed sinus rhythm.  Impression: this is mildly abnormal predominantly asleep EEG that seems to be consistent with a mild encephalopathy, non specific as to cause. No electrographic seizures noted..   Clinical correlation is advised.  Dorian Pod, MD

## 2014-11-27 NOTE — Progress Notes (Signed)
Pharmacy:  phenytoin level 5.8 at 1335 and 5.8 at 2142. Albumin 2.7; Creat cl poor. DPH level adjusted for alb 2.7 and creat cl < 10 is therapeutic at 15.68, calculated free (unbound) level for 5.8 w/ alb 2.7 is 1.04 which is also therapeutic  Plan: continue phenytoin 100 IV q8h  Eudelia Bunch, Pharm.D. [phone] 11/27/2014 11:10 PM

## 2014-11-27 NOTE — Clinical Social Work Note (Signed)
Clinical Social Work Assessment  Patient Details  Name: Joseph [name] MRN: [id number] Date of Birth: Feb 20, 1956  Date of referral:  11/27/14               Reason for consult:  Facility Placement                Permission sought to share information with:  Other (Discussed with wife- [name]- patient continues to have delirium) Permission granted to share information::  Yes, Verbal Permission Granted  Name::     [place] [facility]'s  Agency::     Relationship::     Contact Information:     Housing/Transportation Living arrangements for the past 2 months:  Single Family Home Source of Information:  Spouse Patient Interpreter Needed:  None Criminal Activity/Legal Involvement Pertinent to Current Situation/Hospitalization:  No - Comment as needed Significant Relationships:  Spouse Lives with:  Spouse Do you feel safe going back to the place where you live?  Yes (Wife hopes he can return home but is open to SNF rehab if he does not clear mentally) Need for family participation in patient care:  Yes (Comment)  Care giving concerns: Patient is normally independent of his ADL's but at present is confused and requiring assistance for all care.  He recognizes his wife and some family members but is not oriented other spheres. Wife is very upset about this and worried that this will not resolve.   Social Worker assessment / plan:  CSW referred to assist patient and wife with short term SNF per recommendation of PT. CSW met briefly with wife who states that prior to hospitalization- patient was alert, oriented and fully independent of his ADL's. Seeing him in this current state has cause her to be very upset as she states that MD's are not sure if he will even get better.  CSW presented option of short term SNF to wife per PT recommendation and she is wiling to consider. Discussed bed search process and she agreed to initial search.FL2 completed and placed on chart for MD's signature.    Employment  status:  Disabled (Comment on whether or not currently receiving Disability) (On Medicare) Insurance information:  Medicare PT Recommendations:  [place] / Referral to community resources:  [place]  Patient/Family's Response to care: Wife feels that everything is being done possible for her husband at this time but is very upset that they do not know what is actually wrong with him.  Tests have thus far been inconclusive.  She states "It's the not knowing that is the worst."  Patient/Family's Understanding of and Emotional Response to Diagnosis, Current Treatment, and Prognosis: Wife noted to be very tearful during today's visit and admits to being emotionally and physically exhausted. She is afraid to leave the room as she might miss the doctor's visit.  She states that she has family support from her mother and other family members who live in the area and that this has been helpful.  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  CSW provided support and encouragement; she remained tearful but was appreciative of CSW's visit.  Wife agrees to SNF search as she feels she will not be able to manage his care at d/c unless he makes a strong recovery.   Emotional Assessment Appearance:  Appears stated age Attitude/Demeanor/Rapport:  Unable  to Assess (Patient is alert but confused- not baseline) Affect (typically observed):  Unable to Assess (Alert but confused- not baseline) Orientation:  Oriented to Self Alcohol / Substance use:  Tobacco Use Psych involvement (Current and /or in the community):  No (Comment)  Discharge Needs  Concerns to be addressed:  Discharge Planning Concerns, Care Coordination Readmission within the last 30 days:  Yes Current discharge risk:  Chronically ill Barriers to Discharge:  Continued Medical Work  up   Kendell Bane T, Marlinda Mike 11/27/2014, 4:45 PM

## 2014-11-27 NOTE — Progress Notes (Signed)
PATIENT DETAILS Name: Joseph [name] Age: 59 y.o. Sex: male Date of Birth: 08/13/1955 Admit Date: 11/18/2014 Admitting Physician Theodis Blaze, MD NID:POEUM, REGINA, NP  Brief narrative:  59 year old male with history of end-stage renal disease on hemodialysis, recent hospitalization and discharge on 7/22 for C. difficile colitis, presented on 7/31 with altered mental status, diabetic ketoacidosis, and significant leukocytosis. Initially, altered mental status was felt to be from toxic metabolic encephalopathy setting of C. difficile with sepsis and DKA. 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 Neurology was subsequently consulted, underwent lumbar puncture on 8/5-CSF did not show any major abnormalities, neurology now has started the patient on high-dose steroids. See below for further details  Subjective:  Patient in bed, denies headache, no chest abdominal pain. Mildly confused. No focal weakness.  Assessment/Plan:   Acute encephalopathy: Initially suspected to be from toxic metabolic encephalopathy from mild DKA, C. difficile colitis. Since continued to have confusion inspite of improvement in blood sugars and C. 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 Repeat MRI 11-26-14 mild improvement, pending repeat EEG.  Seen by neurology underwent LP which was unimpressive. Initially given a trial of acyclovir which was stopped by neurology.   Currently on Dilantin for his seizures along with high-dose IV Solu-Medrol per neurology. 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  Pending CSF serology-CMV, Lyme studies pending. CSF NMDA Ab and paraneoplastic panel pending as well.      C. difficile colitis with sepsis: diarrhea improved, continue with oral vancomycin- started 7/31       Atrial fibrillation with RVR: heart rate much better after adjustment of meds-continue cardizem and metoprolol.  Chads2vasc score of 2, Stop heparin gtt, pharm monitoring Coumadin. Cards following.  Lab Results  Component Value Date   INR 2.43* 11/27/2014   INR 1.39 11/26/2014   INR 1.38 11/25/2014     Mildly elevated Troponin:secondary to demand ischemia/RVR/CKD. No further recommendations from cards. On beta blocker continue. This was not ACS.  Elevated LFT's: ?etiology-suspect shock liver from sepsis.USG abd and recently questions mild cirrhosis, Hepatitis serology neg. LFT's downtrending, ferritin level was elevated will have outpatient GI follow-up.  End-stage renal disease: On hemodialysis-Monday, Wednesday and Friday. Nephrology following  Gout: Continue with colcichine  Chronic skin rash: I Discussed his case with his dermatologist Dr. Allyson Sabal who describes this as nonspecific dermatitis. Supportive care only.  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 Question paraneoplastic presentation of occult malignancy, will check CT chest abdomen pelvis on 11/27/2014.  DKA in DM2 : 2 outpatient control, DKA has Resolved. 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 We have placed him on glucose stabilizer for now,  monitor CBGs.  CBG (last 3)   Recent Labs  11/27/14 0025 11/27/14 0504 11/27/14 0748  GLUCAP 313* 229* 215*    Lab Results  Component Value Date   HGBA1C 9.4* 11/07/2014      Disposition:  Remain inpatient-home when mental status improves  Antimicrobial agents   See below  Anti-infectives    Start     Dose/Rate Route Frequency Ordered Stop   11/26/14 1200  vancomycin (VANCOCIN) 50 mg/mL oral solution 500 mg  500 mg Oral 4 times per day 11/26/14 0938 11/30/14 1159   11/22/14 1015  acyclovir (ZOVIRAX) 800 mg  in dextrose 5 % 150 mL IVPB  Status:  Discontinued     800 mg 166 mL/hr over 60 Minutes Intravenous Every 12 hours 11/22/14 1001 11/22/14 1009   11/22/14 1015  acyclovir (ZOVIRAX) 400 mg in dextrose 5 % 100 mL IVPB  Status:  Discontinued     400 mg 108 mL/hr over 60 Minutes Intravenous Every 24 hours 11/22/14 1010 11/25/14 0758   11/22/14 1000  acyclovir (ZOVIRAX) 200 MG capsule 200 mg  Status:  Discontinued     200 mg Oral 3 times daily 11/22/14 0826 11/22/14 1000   11/19/14 1400  vancomycin (VANCOCIN) 50 mg/mL oral solution 500 mg     500 mg Oral 4 times per day 11/19/14 1001 11/25/14 1359   11/18/14 1400  vancomycin (VANCOCIN) 125 MG capsule 250 mg  Status:  Discontinued     250 mg Oral 4 times daily 11/18/14 1128 11/18/14 1248   11/18/14 1400  vancomycin (VANCOCIN) 50 mg/mL oral solution 125 mg  Status:  Discontinued     125 mg Oral 4 times per day 11/18/14 1248 11/18/14 1320   11/18/14 1400  vancomycin (VANCOCIN) 50 mg/mL oral solution 250 mg  Status:  Discontinued     250 mg Oral 4 times per day 11/18/14 1320 11/19/14 1001      DVT Prophylaxis: Coumadin/Heparin gtt  Code Status: Full code   Family Communication Wife and mother-in-law bedside on 11/27/2014   Procedures:  LP. Showing mildly increased protein levels  EEG - recorded two seizures which appear to arise from the left hemisphere as well as signs of irritability with potential for seizure origination in the right temporal region as well.     CONSULTS:  nephrology , Cards, neurology   Time spent 35 minutes-Greater than 50% of this time was spent in counseling, explanation of diagnosis, planning of further management, and coordination of care.  MEDICATIONS: Scheduled Meds: . antiseptic oral rinse  7 mL Mouth Rinse 6 times per day  . calcitRIOL  1.75 mcg Oral Q M,W,F-HD  . clobetasol cream  1 application Topical TID  . colchicine  0.3 mg Oral Once per day on Mon Thu  . diltiazem  30 mg Oral 4 times per day   . feeding supplement (NEPRO CARB STEADY)  237 mL Oral BID BM  . insulin aspart  0-9 Units Subcutaneous 6 times per day  . LORazepam  1 mg Intravenous Once  . methylPREDNISolone (SOLU-MEDROL) injection  1,000 mg Intravenous Daily  . metoprolol  50 mg Oral BID  . multivitamin  1 tablet Oral QHS  . pantoprazole  20 mg Oral BID  . phenytoin (DILANTIN) IV  100 mg Intravenous 3 times per day  . saccharomyces boulardii  250 mg Oral BID  . sevelamer carbonate  1,600 mg Oral TID WC  . vancomycin  500 mg Oral 4 times per day  . Warfarin - Pharmacist Dosing Inpatient   Does not apply q1800   Continuous Infusions: . sodium chloride 10 mL/hr at 11/18/14 2300  . sodium chloride 10 mL/hr at 11/27/14 0541  . heparin 1,250 Units/hr (11/26/14 1721)   PRN Meds:.sodium chloride, sodium chloride, camphor-menthol, dextrose, feeding supplement (NEPRO CARB STEADY), heparin, heparin, hydrOXYzine, lidocaine (PF), lidocaine-prilocaine, metoprolol, pentafluoroprop-tetrafluoroeth    PHYSICAL EXAM: Vital signs in last 24 hours: Filed Vitals:   11/26/14 1734 11/26/14 2151 11/27/14 0104 11/27/14 0505  BP: 133/82 114/62 133/76 125/78  Pulse: 89 96 84 85  Temp:  99.5 F (37.5 C) 98.5 F (36.9 C) 97.8 F (36.6 C)  TempSrc:  Oral Oral Oral  Resp: 18 16 16 18   Height:      Weight:      SpO2: 93% 100% 96% 98%    Weight change: 12 kg (26 lb 7.3 oz) Filed Weights   11/23/14 0743 11/26/14 0546 11/26/14 0920  Weight: 73.4 kg (161 lb 13.1 oz) 74 kg (163 lb 2.3 oz) 86 kg (189 lb 9.5 oz)   Body mass index is 25.71 kg/(m^2).   Gen Exam: Awake/alert-answers few questions appropriately today Neck: Supple, No JVD.   Chest: B/L Clear.   CVS: S1 S2 Regular, no murmurs.  Abdomen: soft, BS +, non tender, non distended.  Extremities: no edema, lower extremities warm to touch. Neurologic: Non Focal.  Skin:papular dry rash all over mostly on the back Wounds: N/A.    Intake/Output from previous  day:  Intake/Output Summary (Last 24 hours) at 11/27/14 1111 Last data filed at 11/27/14 0900  Gross per 24 hour  Intake    320 ml  Output   2100 ml  Net  -1780 ml     LAB RESULTS: CBC  Recent Labs Lab 11/23/14 0532 11/24/14 0352 11/25/14 0514 11/26/14 0240 11/27/14 0723  WBC 12.0* 13.6* 12.6* 13.9* 10.3  HGB 12.3* 11.7* 10.8* 10.4* 10.5*  HCT 36.4* 35.3* 32.5* 30.2* 31.7*  PLT 139* 136* 111* 113* 114*  MCV 93.6 94.9 93.9 91.5 93.0  MCH 31.6 31.5 31.2 31.5 30.8  MCHC 33.8 33.1 33.2 34.4 33.1  RDW 17.5* 17.7* 17.7* 17.4* 17.8*    Chemistries   Recent Labs Lab 11/22/14 0511 11/23/14 0532 11/24/14 0352 11/25/14 0514 11/26/14 0951  NA 133* 135 137 126* 125*  K 3.7 4.1 4.0 4.5 4.3  CL 97* 96* 98* 91* 90*  CO2 24 24 29 26 22   GLUCOSE 54* 52* 50* 498* 146*  BUN 46* 57* 29* 56* 82*  CREATININE 3.97* 4.68* 3.20* 4.40* 5.00*  CALCIUM 7.6* 7.9* 7.7* 7.7* 8.0*    CBG:  Recent Labs Lab 11/26/14 1824 11/26/14 2043 11/27/14 0025 11/27/14 0504 11/27/14 0748  GLUCAP 162* 242* 313* 229* 215*    GFR Estimated Creatinine Clearance: 17.5 mL/min (by C-G formula based on Cr of 5).  Coagulation profile  Recent Labs Lab 11/23/14 0532 11/24/14 0352 11/25/14 0514 11/26/14 0125 11/27/14 0723  INR 1.31 1.29 1.38 1.39 2.43*    Cardiac Enzymes  Recent Labs Lab 11/23/14 1615 11/23/14 2112 11/24/14 0352  TROPONINI 0.52* 0.53* 0.51*    Invalid input(s): POCBNP No results for input(s): DDIMER in the last 72 hours. No results for input(s): HGBA1C in the last 72 hours. No results for input(s): CHOL, HDL, LDLCALC, TRIG, CHOLHDL, LDLDIRECT in the last 72 hours. No results for input(s): TSH, T4TOTAL, T3FREE, THYROIDAB in the last 72 hours.  Invalid input(s): FREET3 No results for input(s): VITAMINB12, FOLATE, FERRITIN, TIBC, IRON, RETICCTPCT in the last 72 hours. No results for input(s): LIPASE, AMYLASE in the last 72 hours.  Urine Studies No results for  input(s): UHGB, CRYS in the last 72 hours.  Invalid input(s): UACOL, UAPR, USPG, UPH, UTP, UGL, UKET, UBIL, UNIT, UROB, ULEU, UEPI, UWBC, URBC, UBAC, CAST, UCOM, BILUA  MICROBIOLOGY: Recent Results (from the past 240 hour(s))  Blood culture (routine x 2)     Status: None   Collection Time: 11/18/14  6:30 AM  Result Value  Ref Range Status   Specimen Description BLOOD LEFT ARM  Final   Special Requests BOTTLES DRAWN AEROBIC AND ANAEROBIC 5CC  Final   Culture NO GROWTH 5 DAYS  Final   Report Status 11/23/2014 FINAL  Final  Blood culture (routine x 2)     Status: None   Collection Time: 11/18/14  6:30 AM  Result Value Ref Range Status   Specimen Description BLOOD LEFT HAND  Final   Special Requests BOTTLES DRAWN AEROBIC AND ANAEROBIC 5CC  Final   Culture NO GROWTH 5 DAYS  Final   Report Status 11/23/2014 FINAL  Final  Urine culture     Status: None   Collection Time: 11/18/14 11:30 AM  Result Value Ref Range Status   Specimen Description URINE, RANDOM  Final   Special Requests NONE  Final   Culture   Final    >=100,000 COLONIES/mL ENTEROCOCCUS SPECIES 30,000 COLONIES/mL ESCHERICHIA COLI    Report Status 11/21/2014 FINAL  Final   Organism ID, Bacteria ENTEROCOCCUS SPECIES  Final   Organism ID, Bacteria ESCHERICHIA COLI  Final      Susceptibility   Escherichia coli - MIC*    AMPICILLIN >=32 RESISTANT Resistant     CEFAZOLIN >=64 RESISTANT Resistant     CEFTRIAXONE <=1 SENSITIVE Sensitive     CIPROFLOXACIN <=0.25 SENSITIVE Sensitive     GENTAMICIN <=1 SENSITIVE Sensitive     IMIPENEM <=0.25 SENSITIVE Sensitive     NITROFURANTOIN <=16 SENSITIVE Sensitive     TRIMETH/SULFA <=20 SENSITIVE Sensitive     AMPICILLIN/SULBACTAM 8 SENSITIVE Sensitive     PIP/TAZO <=4 SENSITIVE Sensitive     * 30,000 COLONIES/mL ESCHERICHIA COLI   Enterococcus species - MIC*    AMPICILLIN <=2 SENSITIVE Sensitive     LEVOFLOXACIN 1 SENSITIVE Sensitive     NITROFURANTOIN <=16 SENSITIVE Sensitive      VANCOMYCIN 2 SENSITIVE Sensitive     LINEZOLID 1 SENSITIVE Sensitive     * >=100,000 COLONIES/mL ENTEROCOCCUS SPECIES  MRSA PCR Screening     Status: None   Collection Time: 11/18/14 11:30 AM  Result Value Ref Range Status   MRSA by PCR NEGATIVE NEGATIVE Final    Comment:        The GeneXpert MRSA Assay (FDA approved for NASAL specimens only), is one component of a comprehensive MRSA colonization surveillance program. It is not intended to diagnose MRSA infection nor to guide or monitor treatment for MRSA infections.   Clostridium Difficile by PCR (not at [facility])     Status: None   Collection Time: 11/19/14  3:50 PM  Result Value Ref Range Status   Toxigenic C Difficile by pcr NEGATIVE NEGATIVE Final  CSF culture with Stat gram stain     Status: None (Preliminary result)   Collection Time: 11/23/14  4:30 PM  Result Value Ref Range Status   Specimen Description CSF  Final   Special Requests NONE  Final   Gram Stain   Final    WBC PRESENT, PREDOMINANTLY MONONUCLEAR NO ORGANISMS SEEN CYTOSPIN    Culture NO GROWTH 3 DAYS  Final   Report Status PENDING  Incomplete    RADIOLOGY STUDIES/RESULTS: Dg Chest 2 View  11/18/2014   CLINICAL DATA:  Altered mental status. Shortness of breath and nausea today.  EXAM: CHEST  2 VIEW  COMPARISON:  11/05/2014  FINDINGS: Chronic cardiomegaly and vascular congestion, unchanged. Diminished right pleural effusion and basilar opacity from prior exam. No new confluent airspace disease or pneumothorax. No acute osseous  abnormalities.  IMPRESSION: 1. Diminished right pleural effusion and right basilar opacity. 2. Stable chronic cardiomegaly and vascular congestion.   Electronically Signed   By: Jeb Levering M.D.   On: 11/18/2014 06:21   Dg Chest 2 View  11/05/2014   CLINICAL DATA:  Dyspnea for 1 day  EXAM: CHEST  2 VIEW  COMPARISON:  July 03, 2014  FINDINGS: The heart size and mediastinal contours are stable. There is mild patchy consolidation of  lateral right lung base with small right pleural effusion. There is mild chronic central pulmonary vascular congestion. The visualized skeletal structures are stable.  IMPRESSION: Chronic central pulmonary vascular congestion.  Small right pleural effusion with associated mild patchy opacity of right lung base which could be due to atelectasis but superimposed pneumonia is not excluded.   Electronically Signed   By: Abelardo Diesel M.D.   On: 11/05/2014 21:54   Ct Head Wo Contrast  11/18/2014   CLINICAL DATA:  Altered mental status.  EXAM: CT HEAD WITHOUT CONTRAST  TECHNIQUE: Contiguous axial images were obtained from the base of the skull through the vertex without intravenous contrast.  COMPARISON:  None.  FINDINGS: No intracranial hemorrhage, mass effect, or midline shift. Small focal encephalomalacia in the left occipital lobe. No hydrocephalus. The basilar cisterns are patent. No evidence of territorial infarct. No intracranial fluid collection. Calvarium is intact. Included paranasal sinuses and mastoid air cells are well aerated.  IMPRESSION: 1.  No acute intracranial abnormality. 2. Small focal encephalomalacia in the left occipital lobe.   Electronically Signed   By: Jeb Levering M.D.   On: 11/18/2014 06:13   Mr Brain Wo Contrast  11/26/2014   CLINICAL DATA:  Encephalitis.  Confusion.  EXAM: MRI HEAD WITHOUT CONTRAST  TECHNIQUE: Multiplanar, multiecho pulse sequences of the brain and surrounding structures were obtained without intravenous contrast.  COMPARISON:  MRI of the brain 11/21/2014.  FINDINGS: Restricted diffusion within the hippocampal structures bilaterally is somewhat less conspicuous than on the prior exam. Diffuse T2 signal remains in the medial temporal lobes. There is increased swelling in the left medial temporal lobe compared to the prior exam. The right hippocampus is similar to the prior exam.  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 Scattered periventricular and  subcortical T2 changes are otherwise stable. No other new areas of signal abnormality are evident.  Flow was present in the major intracranial arteries. The globes orbits are intact. The paranasal sinuses are clear. There is some fluid in the inferior mastoid air cells. No obstructing nasopharyngeal lesion is present. Skullbase is within normal limits. Midline structures are unremarkable.  IMPRESSION: 1. Diffusion abnormality in the medial temporal lobes and hippocampal structures is improving. 2. Swelling and T2 signal is more prominent in the left medial temporal lobe and hippocampus. This may be related to seizure activity given the abnormal EEG and negative lumbar puncture. 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   Electronically Signed   By: San Morelle M.D.   On: 11/26/2014 17:14   Mr Brain Wo Contrast  11/21/2014   CLINICAL DATA:  Altered mental status. Encephalopathy. Atrial fibrillation.  EXAM: MRI HEAD WITHOUT CONTRAST  TECHNIQUE: Multiplanar, multiecho pulse sequences of the brain and surrounding structures were obtained without intravenous contrast.  COMPARISON:  CT head without contrast from the same day.  FINDINGS: Restricted diffusion and increased T2 signal is present within the medial temporal lobes and hippocampal structures bilaterally. Inferior temporal lobes are intact.  Moderate generalized atrophy  and diffuse periventricular T2 changes bilaterally are advanced for age. The ventricles are of normal size.  Flow is present in the major intracranial arteries. The globes and orbits are intact. The paranasal sinuses are clear. There is some fluid in the right mastoid air cells. No obstructing nasopharyngeal lesion is evident.  IMPRESSION: Abnormal T2 signal and restricted diffusion within the medial temporal lobes and hippocampus bilaterally. 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 There is no hemorrhage or signal change along the undersurface the temporal  lobes, but herpes encephalitis is also considered. Status epilepticus is also on the differential diagnosis.  Atrophy in T2 signal changes are advanced for age. This is nonspecific, but likely reflects the sequela of chronic microvascular ischemia.  These results will be called to the ordering clinician or representative by the Radiologist Assistant, and communication documented in the PACS or zVision Dashboard.   Electronically Signed   By: San Morelle M.D.   On: 11/21/2014 19:01   US Abdomen Complete  11/06/2014   CLINICAL DATA:  Abnormal liver function tests  EXAM: ULTRASOUND ABDOMEN COMPLETE  COMPARISON:  None.  FINDINGS: Gallbladder: No gallstones or wall thickening visualized. No sonographic Murphy sign noted.  Common bile duct: Diameter: 4 mm  Liver: Minimally nodular contour with no focal abnormalities.  IVC: No abnormality visualized.  Pancreas: Visualized portion unremarkable.  Spleen: Size and appearance within normal limits.  Right Kidney: Length: 10.7 cm. Midpole cyst measuring 14 mm. Mildly increased echogenicity.  Left Kidney: Length: 10.8 cm. Lower pole cyst measures 1 cm. Mildly increased echogenicity.  Abdominal aorta: No aneurysm identified. Limited visualization distally due to bowel gas.  Other findings: Right pleural effusion.  Small volume ascites.  IMPRESSION: Mildly nodular liver contour with small volume of ascites and right pleural effusion. Possibility of cirrhosis not excluded.  Evidence of medical renal disease.   Electronically Signed   By: Skipper Cliche M.D.   On: 11/06/2014 21:38   Ir US Guide Vasc Access Right  11/26/2014   CLINICAL DATA:  Atrial fibrillation  EXAM: RIGHT JUGULAR TUNNELED PICC LINE PLACEMENT WITH ULTRASOUND AND FLUOROSCOPIC GUIDANCE  FLUOROSCOPY TIME:  18 seconds in  PROCEDURE: The patient was advised of the possible risks andcomplications and agreed to undergo the procedure. The patient was then brought to the angiographic suite for the procedure.   The right neck was prepped with chlorhexidine, drapedin the usual sterile fashion using maximum barrier technique (cap and mask, sterile gown, sterile gloves, large sterile sheet, hand hygiene and cutaneous antisepsis) and infiltrated locally with 1% Lidocaine.  Ultrasound demonstrated patency of the right internal jugular vein, and this was documented with an image. Under real-time ultrasound guidance, this vein was accessed with a 21 gauge micropuncture needle and image documentation was performed. A 0.018 wire was introduced in to the vein. Over this, a 5 [place] double lumen Power tunneled PICC was advanced to the lower SVC/right atrial junction. The cuff was positioned in the subcutaneous tract. Fluoroscopy during the procedure and fluoro spot radiograph confirms appropriate catheter position. The catheter was flushed and covered with asterile dressing.  Catheter length: 21 cm  COMPLICATIONS: None  IMPRESSION: Successful right jugular tunneled power PICC line placement with ultrasound and fluoroscopic guidance. The catheter is ready for use.   Electronically Signed   By: Marybelle Killings M.D.   On: 11/26/2014 16:15   Ir Fluoro Guide Cv Midline Picc Right  11/26/2014   CLINICAL DATA:  Atrial fibrillation  EXAM: RIGHT JUGULAR TUNNELED PICC  LINE PLACEMENT WITH ULTRASOUND AND FLUOROSCOPIC GUIDANCE  FLUOROSCOPY TIME:  18 seconds in  PROCEDURE: The patient was advised of the possible risks andcomplications and agreed to undergo the procedure. The patient was then brought to the angiographic suite for the procedure.  The right neck was prepped with chlorhexidine, drapedin the usual sterile fashion using maximum barrier technique (cap and mask, sterile gown, sterile gloves, large sterile sheet, hand hygiene and cutaneous antisepsis) and infiltrated locally with 1% Lidocaine.  Ultrasound demonstrated patency of the right internal jugular vein, and this was documented with an image. Under real-time ultrasound guidance, this  vein was accessed with a 21 gauge micropuncture needle and image documentation was performed. A 0.018 wire was introduced in to the vein. Over this, a 5 [place] double lumen Power tunneled PICC was advanced to the lower SVC/right atrial junction. The cuff was positioned in the subcutaneous tract. Fluoroscopy during the procedure and fluoro spot radiograph confirms appropriate catheter position. The catheter was flushed and covered with asterile dressing.  Catheter length: 21 cm  COMPLICATIONS: None  IMPRESSION: Successful right jugular tunneled power PICC line placement with ultrasound and fluoroscopic guidance. The catheter is ready for use.   Electronically Signed   By: Marybelle Killings M.D.   On: 11/26/2014 16:15    Thurnell Lose, MD  [facility] Pager:[phone]  If 7PM-7AM, please contact night-coverage [email or web address] Password [email or web address] 11/27/2014, 11:11 AM   LOS: 9 days

## 2014-11-27 NOTE — Progress Notes (Signed)
ANTICOAGULATION CONSULT NOTE - Follow Up Consult  Pharmacy Consult for Coumadin & phenytoin Indication: Afib & seizure  Allergies  Allergen Reactions  . Dilaudid [Hydromorphone] Nausea And Vomiting    Patient Measurements: Height: 6' (182.9 cm) Weight: 189 lb 9.5 oz (86 kg) (discrempacie noted/ weight pt three times) IBW/kg (Calculated) : 77.6  Vital Signs: Temp: 98.7 F (37.1 C) (08/09 1117) Temp Source: Oral (08/09 1117) BP: 138/78 mmHg (08/09 1117) Pulse Rate: 90 (08/09 1117)  Labs:  Recent Labs  11/25/14 0514  11/26/14 0125 11/26/14 0240 11/26/14 0951 11/26/14 1130 11/26/14 1916 11/27/14 0723  HGB 10.8*  --   --  10.4*  --   --   --  10.5*  HCT 32.5*  --   --  30.2*  --   --   --  31.7*  PLT 111*  --   --  113*  --   --   --  114*  LABPROT 17.1*  --  17.1*  --   --   --   --  26.1*  INR 1.38  --  1.39  --   --   --   --  2.43*  HEPARINUNFRC 0.81*  < > 0.11*  --   --  0.43 0.20* 0.47  CREATININE 4.40*  --   --   --  5.00*  --   --   --   < > = values in this interval not displayed.  Estimated Creatinine Clearance: 17.5 mL/min (by C-G formula based on Cr of 5).   Assessment: 59 year old male admitted 11/18/2014 with altered mental status. On warfarin PTA for afib and supratherapeutic INR on admission 3.64>6.53. Restarted heparin on 8/5 @1800  and coumadin resumed 8/6. INR jumped to therapeutic at 2.43 and HL therapeutic at 0.47 so heparin stopped. H/H stable, no s/sx of bleeding noted  Pt also loaded with phenytoin on 8/4 for seizure. Level on 8/5 after load was 12.5. 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 Pt remains confused. HSV negative. Now day #4/5 of steroids. No seizure activity. Most recent albumin is low at 2.7. Tried getting level twice today but unable to draw phenytoin trough before dose given. Will try and get level tonight before dose is given  Goal of Therapy:  INR 2-3 Monitor platelets by anticoagulation  protocol: Yes   Plan:  Hold coumadin tonight Monitor daily INR, CBC, s/s of bleed   Continue Dilantin 100 mg IV Q8 Check phenytoin level tonight before dose given 5 day course of Solu-medro  Smith Mcnicholas J 11/27/2014,1:24 PM

## 2014-11-27 NOTE — Progress Notes (Signed)
Inpatient Diabetes Program Recommendations  AACE/ADA: New Consensus Statement on Inpatient Glycemic Control (2013)  Target Ranges:  Prepandial:   less than 140 mg/dL      Peak postprandial:   less than 180 mg/dL (1-2 hours)      Critically ill patients:  140 - 180 mg/dL   Diabetes Inpatient Program Recommendations: Please consider increase in correction to moderate q 4 hrs.  Thank you Rosita Kea, RN, MSN, CDE  Diabetes Inpatient Program Office: [phone] Pager: [phone] 8:00 am to 5:00 pm

## 2014-11-27 NOTE — Progress Notes (Signed)
EEG Completed; Results Pending  

## 2014-11-27 NOTE — Progress Notes (Signed)
Subjective: Patient had no new complaints. He has remained confused for the most part. His wife indicates that he seems to have periods that are more lucid than others. No recurrence of seizure activity reported.  Objective: Current vital signs: BP 138/78 mmHg  Pulse 90  Temp(Src) 98.7 F (37.1 C) (Oral)  Resp 18  Ht 6' (1.829 m)  Wt 86 kg (189 lb 9.5 oz)  BMI 25.71 kg/m2  SpO2 95%  Neurologic Exam: Patient was alert and oriented to person and was able to determine where he was by glancing at a sign on the wall. He was disoriented to time. There was no expressive nor receptive aphasia. His extraocular movements were full and conjugate. No facial weakness was noted. Patient moved extremities equally with symmetrical strength and coordination.  Medications: I have reviewed the patient's current medications.  EEG today showed mild encephalopathic changes which were nonspecific. There was no evidence of seizure activity.  MRI on 11/26/2014 showed diffuse abnormality involving medial temporal lobes and hippocampal structures appear to be improving. Swelling and T2 signal is more prominent in left medial temporal lobe and hippocampus. These findings were thought to possibly be related to seizure activity.  Assessment/Plan: 59 year old man with encephalopathic changes secondary to multiple factors, including end-stage renal disease, hepatic dysfunction and hyponatremia. Patient is no longer showing medical signs normal EEG evidence of seizure activity. Changes involving temporal lobes and hippocampal structures appear to be improving.  Recommendations: Recommend no changes in current management, including continuing Dilantin for seizure management with Pharmacy managing Dilantin dosing. We will continue to follow this patient with you.  C.R. Nicole Kindred, MD [facility] [phone]  11/27/2014  5:56 PM

## 2014-11-28 DIAGNOSIS — A0471 Enterocolitis due to Clostridium difficile, recurrent: Secondary | ICD-10-CM | POA: Insufficient documentation

## 2014-11-28 DIAGNOSIS — R569 Unspecified convulsions: Secondary | ICD-10-CM

## 2014-11-28 DIAGNOSIS — G0490 Encephalitis and encephalomyelitis, unspecified: Secondary | ICD-10-CM | POA: Insufficient documentation

## 2014-11-28 DIAGNOSIS — A047 Enterocolitis due to Clostridium difficile: Secondary | ICD-10-CM

## 2014-11-28 DIAGNOSIS — E1310 Other specified diabetes mellitus with ketoacidosis without coma: Secondary | ICD-10-CM

## 2014-11-28 DIAGNOSIS — E871 Hypo-osmolality and hyponatremia: Secondary | ICD-10-CM

## 2014-11-28 LAB — CBC
HCT: 28.9 % — ABNORMAL LOW (ref 39.0–52.0)
HCT: 31.8 % — ABNORMAL LOW (ref 39.0–52.0)
HEMOGLOBIN: 10.9 g/dL — AB (ref 13.0–17.0)
Hemoglobin: 9.9 g/dL — ABNORMAL LOW (ref 13.0–17.0)
MCH: 31.7 pg (ref 26.0–34.0)
MCH: 31.4 pg (ref 26.0–34.0)
MCHC: 34.3 g/dL (ref 30.0–36.0)
MCHC: 34.3 g/dL (ref 30.0–36.0)
MCV: 92.6 fL (ref 78.0–100.0)
MCV: 91.6 fL (ref 78.0–100.0)
PLATELETS: 111 10*3/uL — AB (ref 150–400)
PLATELETS: 117 10*3/uL — AB (ref 150–400)
RBC: 3.12 MIL/uL — ABNORMAL LOW (ref 4.22–5.81)
RBC: 3.47 MIL/uL — ABNORMAL LOW (ref 4.22–5.81)
RDW: 17.4 % — AB (ref 11.5–15.5)
RDW: 17.5 % — AB (ref 11.5–15.5)
WBC: 11.5 10*3/uL — ABNORMAL HIGH (ref 4.0–10.5)
WBC: 14 10*3/uL — ABNORMAL HIGH (ref 4.0–10.5)

## 2014-11-28 LAB — RENAL FUNCTION PANEL
ALBUMIN: 2.6 g/dL — AB (ref 3.5–5.0)
Anion gap: 12 (ref 5–15)
BUN: 89 mg/dL — ABNORMAL HIGH (ref 6–20)
CALCIUM: 8 mg/dL — AB (ref 8.9–10.3)
CO2: 21 mmol/L — ABNORMAL LOW (ref 22–32)
Chloride: 93 mmol/L — ABNORMAL LOW (ref 101–111)
Creatinine, Ser: 5.02 mg/dL — ABNORMAL HIGH (ref 0.61–1.24)
GFR calc Af Amer: 13 mL/min — ABNORMAL LOW (ref >60)
GFR calc non Af Amer: 11 mL/min — ABNORMAL LOW (ref >60)
Glucose, Bld: 322 mg/dL — ABNORMAL HIGH (ref 65–99)
Phosphorus: 5.8 mg/dL — ABNORMAL HIGH (ref 2.5–4.6)
Potassium: 4.7 mmol/L (ref 3.5–5.1)
SODIUM: 126 mmol/L — AB (ref 135–145)

## 2014-11-28 LAB — GLUCOSE, CAPILLARY
Glucose-Capillary: 244 mg/dL — ABNORMAL HIGH (ref 65–99)
Glucose-Capillary: 246 mg/dL — ABNORMAL HIGH (ref 65–99)
Glucose-Capillary: 276 mg/dL — ABNORMAL HIGH (ref 65–99)
Glucose-Capillary: 299 mg/dL — ABNORMAL HIGH (ref 65–99)

## 2014-11-28 LAB — PROTIME-INR
INR: 2.96 — ABNORMAL HIGH (ref 0.00–1.49)
PROTHROMBIN TIME: 30.3 s — AB (ref 11.6–15.2)

## 2014-11-28 MED ORDER — HEPARIN SODIUM (PORCINE) 1000 UNIT/ML DIALYSIS
1000.0000 [IU] | INTRAMUSCULAR | Status: DC | PRN
  Filled 2014-11-28: qty 1

## 2014-11-28 MED ORDER — HEPARIN SODIUM (PORCINE) 1000 UNIT/ML DIALYSIS
2000.0000 [IU] | Freq: Once | INTRAMUSCULAR | Status: DC
  Filled 2014-11-28: qty 2

## 2014-11-28 MED ORDER — LIDOCAINE HCL (PF) 1 % IJ SOLN
5.0000 mL | INTRAMUSCULAR | Status: DC | PRN
  Filled 2014-11-28: qty 5

## 2014-11-28 MED ORDER — PHENYTOIN SODIUM EXTENDED 100 MG PO CAPS
300.0000 mg | ORAL_CAPSULE | Freq: Every day | ORAL | Status: DC
  Administered 2014-11-28 – 2014-12-01 (×4): 300 mg via ORAL
  Filled 2014-11-28 (×5): qty 3

## 2014-11-28 MED ORDER — SODIUM CHLORIDE 0.9 % IV SOLN: 100.0000 mL | INTRAVENOUS | Status: DC | PRN

## 2014-11-28 MED ORDER — SODIUM CHLORIDE 0.9 % IJ SOLN
10.0000 mL | INTRAMUSCULAR | Status: DC | PRN
  Administered 2014-11-28 – 2014-12-01 (×6): 10 mL
  Filled 2014-11-28 (×5): qty 40

## 2014-11-28 MED ORDER — ALTEPLASE 2 MG IJ SOLR
2.0000 mg | Freq: Once | INTRAMUSCULAR | Status: DC | PRN
  Filled 2014-11-28: qty 2

## 2014-11-28 MED ORDER — NEPRO/CARBSTEADY PO LIQD: 237.0000 mL | ORAL | Status: DC | PRN

## 2014-11-28 MED ORDER — LIDOCAINE-PRILOCAINE 2.5-2.5 % EX CREA
1.0000 "application " | TOPICAL_CREAM | CUTANEOUS | Status: DC | PRN
  Filled 2014-11-28: qty 5

## 2014-11-28 MED ORDER — PENTAFLUOROPROP-TETRAFLUOROETH EX AERO: 1.0000 "application " | INHALATION_SPRAY | CUTANEOUS | Status: DC | PRN

## 2014-11-28 NOTE — Progress Notes (Signed)
Subjective:   Still confused  Objective Filed Vitals:   11/27/14 2257 11/28/14 0011 11/28/14 0518 11/28/14 1001  BP: 128/77 141/84 132/68 131/71  Pulse:  84 87 96  Temp:   98.7 F (37.1 C) 98.4 F (36.9 C)  TempSrc:   Oral Oral  Resp:   18 18  Height:      Weight:      SpO2:  99% 99% 99%   Physical Exam General: alert- oriented to person only Heart: RRR  Lungs: CTA, unlabored  Abdomen:soft, nontender +BS/ back with rash and papular lesions Extremities: no edema Dialysis Access: L AVG +b/t  [facility] MWF 80kg 3/2.5 bath P2 AVG L arm Heparin 6100  Calcitriol 1.75 (no Fe/ esa) Last tsat 42%, ferr 1122, pth 427, Hb 11.6  Assessment/Plan: 1. DM - DKA on admission- resolved. Per primary 2. C. Diff colitis- on appropriate abx. CDiff negative as of 8/1 (+ 7/19). Primary planning 10 day course from 7/31. The high dose steroids may prompt a longer duration.  3. Encephalopathy - seizure activity on EEG. 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 On anticonvulsants, acyclovir and solumedrol and pending LP- no growth < 24 hrs, HSV PCR's neg. 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 Seizures - IV dilantin  5. ESRD- continue with HD MWF- HD pending today.  6. Enterococcal UTI- on no prolonged rx at this time. Rec'd 1 dose fosfomycin on 8/2 7. Anemia of chronic disease- currently ESA on hold - hgb 11.5. Cont to watch CBC 8. CKD-MBD: Cont with binders (phos 5 in range now) and calcitriol with HD.8/8 9. Abnormal LFT's-? Etiology. Trending down. No abd pain. [place] 11/06/14- mild nodular liver contour w mall volume of ascitis. Possibility of cirrhosis not excluded.Ammonia level 44 on 8/2 10. Nutrition:per primary 11. Hypertension: stable 12. A fib- rate controlled,  warfarin has been resumed per pharmacy.  [name], NP [place] [phone] 11/28/2014,11:59 AM  LOS: 10 days   Pt seen, examined and agree w A/P as above.  Kelly Splinter  MD pager [phone]    cell [phone] 11/28/2014, 2:36 PM    Additional Objective Labs: Basic Metabolic Panel:  Recent Labs Lab 11/22/14 0511 11/23/14 0532 11/24/14 0352 11/25/14 0514 11/26/14 0951  NA 133* 135 137 126* 125*  K 3.7 4.1 4.0 4.5 4.3  CL 97* 96* 98* 91* 90*  CO2 24 24 29 26 22   GLUCOSE 54* 52* 50* 498* 146*  BUN 46* 57* 29* 56* 82*  CREATININE 3.97* 4.68* 3.20* 4.40* 5.00*  CALCIUM 7.6* 7.9* 7.7* 7.7* 8.0*  PHOS 4.1 4.5  --   --  5.0*   Liver Function Tests:  Recent Labs Lab 11/23/14 0532 11/24/14 0352 11/25/14 0514 11/26/14 0951  AST 127* 107* 86*  --   ALT 169* 136* 119*  --   ALKPHOS 209* 208* 191*  --   BILITOT 1.5* 1.5* 1.2  --   PROT 6.3* 5.7* 5.5*  --   ALBUMIN 3.2*  3.2* 2.9* 2.8* 2.7*   No results for input(s): LIPASE, AMYLASE in the last 168 hours. CBC:  Recent Labs Lab 11/24/14 0352 11/25/14 0514 11/26/14 0240 11/27/14 0723 11/28/14 0612  WBC 13.6* 12.6* 13.9* 10.3 11.5*  HGB 11.7* 10.8* 10.4* 10.5* 9.9*  HCT 35.3* 32.5* 30.2* 31.7* 28.9*  MCV 94.9 93.9 91.5 93.0 92.6  PLT 136* 111* 113* 114* 111*   Blood Culture    Component Value Date/Time   SDES CSF 11/23/2014  [place] 11/23/2014 1630   CULT NO GROWTH 3 DAYS 11/23/2014 1630   REPTSTATUS 11/27/2014 FINAL 11/23/2014 1630    Cardiac Enzymes:  Recent Labs Lab 11/23/14 1615 11/23/14 2112 11/24/14 0352  TROPONINI 0.52* 0.53* 0.51*   CBG:  Recent Labs Lab 11/27/14 1601 11/27/14 2057 11/28/14 0022 11/28/14 0517 11/28/14 0748  GLUCAP 344* 348* 299* 276* 246*   Iron Studies: No results for input(s): IRON, TIBC, TRANSFERRIN, FERRITIN in the last 72 hours. @lablastinr3 @ Studies/Results: Ct Abdomen Pelvis Wo Contrast  11/27/2014   CLINICAL DATA:  59 year old male with C difficile, elevated white count, altered mental status/encephalitis and diabetic ketoacidosis. Elevated LFTs and end-stage renal disease.  EXAM: CT CHEST, ABDOMEN AND PELVIS  WITHOUT CONTRAST  TECHNIQUE: Multidetector CT imaging of the chest, abdomen and pelvis was performed following the standard protocol without IV contrast.  COMPARISON:  11/18/2014 and prior chest radiographs. 11/06/2014 abdominal ultrasound.  FINDINGS: CT CHEST FINDINGS  Mediastinum/Nodes: Mild pulmonary and moderate-heavy coronary artery calcifications noted. There is no evidence of thoracic aortic aneurysm or pericardial effusion. Shotty mediastinal lymph nodes are identified.  Lungs/Pleura: A small right pleural effusion is noted. Mild basilar atelectasis identified. Mild interstitial opacities within bilateral upper lobes noted, some with a tree-in-bud type appearance-question infection. There is no definite airspace disease, consolidation, suspicious nodule, mass or endobronchial/ endotracheal lesion.  Musculoskeletal: No acute or suspicious abnormalities.  CT ABDOMEN AND PELVIS FINDINGS  Please note that parenchymal abnormalities may be missed without intravenous contrast.  Hepatobiliary: The liver and gallbladder are unremarkable. There is no evidence of biliary dilatation.  Pancreas: Unremarkable  Spleen: Unremarkable  Adrenals/Urinary Tract: Mild bilateral renal atrophy noted. The adrenal glands and bladder are unremarkable.  Stomach/Bowel: There is no evidence of bowel obstruction or are definite bowel wall thickening. The appendix is normal.  Vascular/Lymphatic: No enlarged lymph nodes or abdominal aortic aneurysm. Mild aortic atherosclerotic calcifications noted.  Reproductive: Prostate unremarkable.  Other: A small amount of ascites within the abdomen and pelvis noted. There is no evidence of pneumoperitoneum. A small umbilical hernia containing fat is identified. Mild subcutaneous edema is present.  Musculoskeletal: No acute or suspicious abnormalities.  IMPRESSION: Mild bilateral upper lobe interstitial opacities some with a tree-in-bud type appearance which may represent infection.  Small right  pleural effusion, small amount of ascites and mild subcutaneous edema.  No evidence of bowel wall thickening.   Electronically Signed   By: Margarette Canada M.D.   On: 11/27/2014 15:22   Ct Chest Wo Contrast  11/27/2014   CLINICAL DATA:  59 year old male with C difficile, elevated white count, altered mental status/encephalitis and diabetic ketoacidosis. Elevated LFTs and end-stage renal disease.  EXAM: CT CHEST, ABDOMEN AND PELVIS WITHOUT CONTRAST  TECHNIQUE: Multidetector CT imaging of the chest, abdomen and pelvis was performed following the standard protocol without IV contrast.  COMPARISON:  11/18/2014 and prior chest radiographs. 11/06/2014 abdominal ultrasound.  FINDINGS: CT CHEST FINDINGS  Mediastinum/Nodes: Mild pulmonary and moderate-heavy coronary artery calcifications noted. There is no evidence of thoracic aortic aneurysm or pericardial effusion. Shotty mediastinal lymph nodes are identified.  Lungs/Pleura: A small right pleural effusion is noted. Mild basilar atelectasis identified. Mild interstitial opacities within bilateral upper lobes noted, some with a tree-in-bud type appearance-question infection. There is no definite airspace disease, consolidation, suspicious nodule, mass or endobronchial/ endotracheal lesion.  Musculoskeletal: No acute or suspicious abnormalities.  CT ABDOMEN AND PELVIS FINDINGS  Please note that parenchymal abnormalities may be missed without intravenous contrast.  Hepatobiliary:  The liver and gallbladder are unremarkable. There is no evidence of biliary dilatation.  Pancreas: Unremarkable  Spleen: Unremarkable  Adrenals/Urinary Tract: Mild bilateral renal atrophy noted. The adrenal glands and bladder are unremarkable.  Stomach/Bowel: There is no evidence of bowel obstruction or are definite bowel wall thickening. The appendix is normal.  Vascular/Lymphatic: No enlarged lymph nodes or abdominal aortic aneurysm. Mild aortic atherosclerotic calcifications noted.  Reproductive:  Prostate unremarkable.  Other: A small amount of ascites within the abdomen and pelvis noted. There is no evidence of pneumoperitoneum. A small umbilical hernia containing fat is identified. Mild subcutaneous edema is present.  Musculoskeletal: No acute or suspicious abnormalities.  IMPRESSION: Mild bilateral upper lobe interstitial opacities some with a tree-in-bud type appearance which may represent infection.  Small right pleural effusion, small amount of ascites and mild subcutaneous edema.  No evidence of bowel wall thickening.   Electronically Signed   By: Margarette Canada M.D.   On: 11/27/2014 15:22   Mr Brain Wo Contrast  11/26/2014   CLINICAL DATA:  Encephalitis.  Confusion.  EXAM: MRI HEAD WITHOUT CONTRAST  TECHNIQUE: Multiplanar, multiecho pulse sequences of the brain and surrounding structures were obtained without intravenous contrast.  COMPARISON:  MRI of the brain 11/21/2014.  FINDINGS: Restricted diffusion within the hippocampal structures bilaterally is somewhat less conspicuous than on the prior exam. Diffuse T2 signal remains in the medial temporal lobes. There is increased swelling in the left medial temporal lobe compared to the prior exam. The right hippocampus is similar to the prior exam.  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 Scattered periventricular and subcortical T2 changes are otherwise stable. No other new areas of signal abnormality are evident.  Flow was present in the major intracranial arteries. The globes orbits are intact. The paranasal sinuses are clear. There is some fluid in the inferior mastoid air cells. No obstructing nasopharyngeal lesion is present. Skullbase is within normal limits. Midline structures are unremarkable.  IMPRESSION: 1. Diffusion abnormality in the medial temporal lobes and hippocampal structures is improving. 2. Swelling and T2 signal is more prominent in the left medial temporal lobe and hippocampus. This may be related to seizure activity given  the abnormal EEG and negative lumbar puncture. 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   Electronically Signed   By: San Morelle M.D.   On: 11/26/2014 17:14   Ir US Guide Vasc Access Right  11/26/2014   CLINICAL DATA:  Atrial fibrillation  EXAM: RIGHT JUGULAR TUNNELED PICC LINE PLACEMENT WITH ULTRASOUND AND FLUOROSCOPIC GUIDANCE  FLUOROSCOPY TIME:  18 seconds in  PROCEDURE: The patient was advised of the possible risks andcomplications and agreed to undergo the procedure. The patient was then brought to the angiographic suite for the procedure.  The right neck was prepped with chlorhexidine, drapedin the usual sterile fashion using maximum barrier technique (cap and mask, sterile gown, sterile gloves, large sterile sheet, hand hygiene and cutaneous antisepsis) and infiltrated locally with 1% Lidocaine.  Ultrasound demonstrated patency of the right internal jugular vein, and this was documented with an image. Under real-time ultrasound guidance, this vein was accessed with a 21 gauge micropuncture needle and image documentation was performed. A 0.018 wire was introduced in to the vein. Over this, a 5 [place] double lumen Power tunneled PICC was advanced to the lower SVC/right atrial junction. The cuff was positioned in the subcutaneous tract. Fluoroscopy during the procedure and fluoro spot radiograph confirms appropriate catheter position. The catheter was flushed and covered  with asterile dressing.  Catheter length: 21 cm  COMPLICATIONS: None  IMPRESSION: Successful right jugular tunneled power PICC line placement with ultrasound and fluoroscopic guidance. The catheter is ready for use.   Electronically Signed   By: Marybelle Killings M.D.   On: 11/26/2014 16:15   Ir Fluoro Guide Cv Midline Picc Right  11/26/2014   CLINICAL DATA:  Atrial fibrillation  EXAM: RIGHT JUGULAR TUNNELED PICC LINE PLACEMENT WITH ULTRASOUND AND FLUOROSCOPIC GUIDANCE  FLUOROSCOPY TIME:  18 seconds in  PROCEDURE: The  patient was advised of the possible risks andcomplications and agreed to undergo the procedure. The patient was then brought to the angiographic suite for the procedure.  The right neck was prepped with chlorhexidine, drapedin the usual sterile fashion using maximum barrier technique (cap and mask, sterile gown, sterile gloves, large sterile sheet, hand hygiene and cutaneous antisepsis) and infiltrated locally with 1% Lidocaine.  Ultrasound demonstrated patency of the right internal jugular vein, and this was documented with an image. Under real-time ultrasound guidance, this vein was accessed with a 21 gauge micropuncture needle and image documentation was performed. A 0.018 wire was introduced in to the vein. Over this, a 5 [place] double lumen Power tunneled PICC was advanced to the lower SVC/right atrial junction. The cuff was positioned in the subcutaneous tract. Fluoroscopy during the procedure and fluoro spot radiograph confirms appropriate catheter position. The catheter was flushed and covered with asterile dressing.  Catheter length: 21 cm  COMPLICATIONS: None  IMPRESSION: Successful right jugular tunneled power PICC line placement with ultrasound and fluoroscopic guidance. The catheter is ready for use.   Electronically Signed   By: Marybelle Killings M.D.   On: 11/26/2014 16:15   Medications: . sodium chloride 10 mL/hr at 11/18/14 2300  . sodium chloride 10 mL/hr at 11/27/14 0541   . antiseptic oral rinse  7 mL Mouth Rinse 6 times per day  . calcitRIOL  1.75 mcg Oral Q M,W,F-HD  . clobetasol cream  1 application Topical TID  . colchicine  0.3 mg Oral Once per day on Mon Thu  . diltiazem  30 mg Oral 4 times per day  . feeding supplement (NEPRO CARB STEADY)  237 mL Oral BID BM  . insulin aspart  0-9 Units Subcutaneous 6 times per day  . LORazepam  1 mg Intravenous Once  . methylPREDNISolone (SOLU-MEDROL) injection  1,000 mg Intravenous Daily  . metoprolol  50 mg Oral BID  . multivitamin  1 tablet  Oral QHS  . pantoprazole  20 mg Oral BID  . phenytoin  300 mg Oral QHS  . saccharomyces boulardii  250 mg Oral BID  . sevelamer carbonate  1,600 mg Oral TID WC  . vancomycin  500 mg Oral 4 times per day  . Warfarin - Pharmacist Dosing Inpatient   Does not apply [phone]

## 2014-11-28 NOTE — Progress Notes (Signed)
Physical Therapy Treatment Patient Details Name: Joseph [name] MRN: [id number] DOB: 02/05/1956 Today's Date: 11/28/2014    History of Present Illness Pt adm metabolic encephalopathy. Due to underlying ESRD, sepsis from C. difficile colitis, mild DKA with ? UTI. AMS not resolving and EEG showed seizures and MRI showed ?infectious process. PMH - ESRD on HD, DM, C-diff    PT Comments    Pt progressing towards physical therapy goals. Pt about to go to HD, however was agreeable to work until transport arrived. Session was limited to standing exercise EOB. RN reports that HR increased up to the 140's during activity. Pt reports fatigue but no other symptoms. Continues to require assist for basic transfers, and feel SNF remains the most appropriate discharge disposition at this time. Will continue to follow.   Follow Up Recommendations  SNF;Supervision for mobility/OOB     Equipment Recommendations  None recommended by PT    Recommendations for Other Services       Precautions / Restrictions Precautions Precautions: Fall Restrictions Weight Bearing Restrictions: No    Mobility  Bed Mobility Overal bed mobility: Needs Assistance Bed Mobility: Supine to Sit;Sit to Supine     Supine to sit: Min guard Sit to supine: Min guard   General bed mobility comments: Increased time to achieve full sitting. Heavy use of railing for support.   Transfers Overall transfer level: Needs assistance Equipment used: Rolling walker (2 wheeled) Transfers: Sit to/from Stand Sit to Stand: Mod assist         General transfer comment: Mod assist to power-up to full standing position. Pt with decreased control while descending to sitting position.   Ambulation/Gait             General Gait Details: Deferred as pt was about to go to HD   Stairs            Wheelchair Mobility    Modified Rankin (Stroke Patients Only)       Balance Overall balance assessment: Needs  assistance Sitting-balance support: Feet supported;No upper extremity supported Sitting balance-Leahy Scale: Good     Standing balance support: Bilateral upper extremity supported Standing balance-Leahy Scale: Poor Standing balance comment: UE support required                    Cognition Arousal/Alertness: Awake/alert Behavior During Therapy: WFL for tasks assessed/performed Overall Cognitive Status: Impaired/Different from baseline Area of Impairment: Attention;Safety/judgement   Current Attention Level: Selective Memory: Decreased recall of precautions;Decreased short-term memory              Exercises General Exercises - Lower Extremity Straight Leg Raises: 10 reps Hip Flexion/Marching: 5 reps Heel Raises: 10 reps (Limited range) Mini-Sqauts: 10 reps Low Level/ICU Exercises Stabilized Bridging: 10 reps    General Comments        Pertinent Vitals/Pain Pain Assessment: No/denies pain    Home Living                      Prior Function            PT Goals (current goals can now be found in the care plan section) Acute Rehab PT Goals PT Goal Formulation: With patient Time For Goal Achievement: 11/27/14 Potential to Achieve Goals: Good Progress towards PT goals: Progressing toward goals    Frequency  Min 3X/week    PT Plan Current plan remains appropriate    Co-evaluation  End of Session   Activity Tolerance: Patient tolerated treatment well Patient left: in bed;with call bell/phone within reach;with family/visitor present     Time: 4159-7331 PT Time Calculation (min) (ACUTE ONLY): 29 min  Charges:  $Therapeutic Exercise: 8-22 mins $Therapeutic Activity: 8-22 mins                    G Codes:      Rolinda Roan 12/22/14, 3:49 PM  Rolinda Roan, PT, DPT Acute Rehabilitation Services Pager: [phone]

## 2014-11-28 NOTE — Consult Note (Signed)
[place] [facility]    Date of Admission:  11/18/2014   Total days of antibiotics 11        Day 11 Vancomycin        1 Day Fosfomycin        3 Days Acyclovir        Reason for Consult: Infectious workup of delerium    Referring Physician: Dr. Candiss Norse   Principal Problem:   Delirium Active Problems:   Atrial fibrillation-permanent   Obstructive sleep apnea   Pulmonary hypertension   Rash   Volume overload   ESRD on hemodialysis   Supratherapeutic INR   C. difficile diarrhea   DKA, type 2   Hyperkalemia   Encephalopathy   Confusion   Limbic encephalitis   . antiseptic oral rinse  7 mL Mouth Rinse 6 times per day  . calcitRIOL  1.75 mcg Oral Q M,W,F-HD  . clobetasol cream  1 application Topical TID  . colchicine  0.3 mg Oral Once per day on Mon Thu  . diltiazem  30 mg Oral 4 times per day  . feeding supplement (NEPRO CARB STEADY)  237 mL Oral BID BM  . [START ON 11/29/2014] heparin  2,000 Units Dialysis Once in dialysis  . insulin aspart  0-9 Units Subcutaneous 6 times per day  . LORazepam  1 mg Intravenous Once  . metoprolol  50 mg Oral BID  . multivitamin  1 tablet Oral QHS  . pantoprazole  20 mg Oral BID  . phenytoin  300 mg Oral QHS  . saccharomyces boulardii  250 mg Oral BID  . sevelamer carbonate  1,600 mg Oral TID WC  . vancomycin  500 mg Oral 4 times per day  . Warfarin - Pharmacist Dosing Inpatient   Does not apply q1800    Recommendations: 1. No new medication recommendations 2. May retap (lumbar puncture) w/ repeat serologies, PCR, cell count, diff, and culture if patient does not improve soon 3. Will follow up results of current tests  Assessment: Patient presents with a mixed picture for his delerium. While the acute onset certainly could be infectious, and the constellation of acute onset, encephalopathy, seizures and rash could point to disseminated HSV or VZV; however patient is immunocompetent and this would be rare.  Patient also had an equivocal test for [facility], and was already on acyclovir when PCR testing was done for lumbar puncture. Several other things to consider could include delirium due to medications, seizures, high dose steroid use. For now, it may be best to hold steroids and only investigate further infectious causes with repeat lumbar puncture w/ repeat of PCR, gram stain, culture, cell count, serologies.    HPI: Joseph [name] is a 59 y.o. male w/ pmh sig for afib, HTN, DM2, ESRD w/ AVf HD who presents with encephalopathy. Patient had recent hospitalization for c diff colitis treated w/ oral vancomycin. Initially patient was thought to have a toxic metabolic encaphalopathy due to a hyperosmolar or DKA like state w/ possible C diff colitis infection also contributing. However, patient had little to minimal improvement while in the hospital. According to his wife, patient was normal 2 days prior to hospitalization, but 1 day prior developed halluincations, icnreased thirst and hunger, disorientation, waxing and wanining cognition, generalized weakness, memory impairment, and altered level of activity. Patient did not have fevers or seizures that wife was aware of at home. During hospitalization, patient's wife attests to improvement -  patient is no longer having hallucinations, but his disorinetation has not improved at all. Patient was seen by neurology, had a lumber puncture, CT chest, MRI brain (showed possible signs of temporal edema and limbic encephalitis), infectious workup including HSV, VZV, EBV, HIV, which were all negative. Patient had minimally positive CMV PCR. Patient had EEG which showed seizures. Patient has also been on steroids and been on acyclovir for 3 days prior to lumbar puncture. Patient also had NMDA ab, lyme serology sent. Patient continues HD and has remained afebrile with declining WBC. No other reported symptoms now outside of confusion and disorientation. Patient's wife also reports  patient had a pustular rash w/ some drainage and some crusting that started in early June and radiated outwards from his back across both sides and down both arms and legs.    WIFE is reachable at [phone] [place])   Review of Systems: Pertinent items are noted in HPI.  Past Medical History  Diagnosis Date  . Atrial fibrillation     a. 11/18/2011 s/p DCCV - 150J;  b. Anticoagulation w/ Apixaban;  c. 11/2011 back in afib->asymptomatic.  . H/O alcohol abuse     a. drinks heavily on the weekends.  . Hypertension   . Diabetes mellitus   . Heart murmur     a. 09/2011 Echo: EF 55-60%, Triv AI, Mild MR, mildly dil LA.  . Diabetic peripheral neuropathy   . CKD (chronic kidney disease), stage III   . CHF (congestive heart failure)   . Pneumonia   . Sleep apnea     Social History  Substance Use Topics  . Smoking status: Former Smoker -- 0.01 packs/day for 5 years    Types: Cigarettes    Quit date: 04/20/1994  . Smokeless tobacco: Never Used     Comment: some in college  . Alcohol Use: No    Family History  Problem Relation Age of Onset  . Heart disease Mother     before age 81  . Diabetes Father   . Diabetes Sister   . Peripheral vascular disease Sister     amputation  . Cancer Neg Hx   . Stroke Neg Hx    Allergies  Allergen Reactions  . Dilaudid [Hydromorphone] Nausea And Vomiting    OBJECTIVE: Blood pressure 146/85, pulse 92, temperature 98.1 F (36.7 C), temperature source Oral, resp. rate 22, height 6' (1.829 m), weight 86 kg (189 lb 9.5 oz), SpO2 97 %. General: NAD, lying in HD bed asleep Skin: Has various scaling macular papular circles, no weeping, across back and arms, no crusting Lungs: CTAB Cor: Irregularly irregular, 2/6 sys flow murmur Abdomen: +BS, nondistended, nontender No signs of lower extremity edema Neuro: alert and oriented x 0, easily confused, short term memory impaired, very confused but pleasant and interactive   Lab Results Lab  Results  Component Value Date   WBC 11.5* 11/28/2014   HGB 9.9* 11/28/2014   HCT 28.9* 11/28/2014   MCV 92.6 11/28/2014   PLT 111* 11/28/2014    Lab Results  Component Value Date   CREATININE 5.02* 11/28/2014   BUN 89* 11/28/2014   NA 126* 11/28/2014   K 4.7 11/28/2014   CL 93* 11/28/2014   CO2 21* 11/28/2014    Lab Results  Component Value Date   ALT 119* 11/25/2014   AST 86* 11/25/2014   ALKPHOS 191* 11/25/2014   BILITOT 1.2 11/25/2014     Microbiology: Recent Results (from the past 240 hour(s))  Clostridium Difficile  by PCR (not at [facility])     Status: None   Collection Time: 11/19/14  3:50 PM  Result Value Ref Range Status   Toxigenic C Difficile by pcr NEGATIVE NEGATIVE Final  CSF culture with Stat gram stain     Status: None   Collection Time: 11/23/14  4:30 PM  Result Value Ref Range Status   Specimen Description CSF  Final   Special Requests NONE  Final   Gram Stain   Final    WBC PRESENT, PREDOMINANTLY MONONUCLEAR NO ORGANISMS SEEN CYTOSPIN    Culture NO GROWTH 3 DAYS  Final   Report Status 11/27/2014 FINAL  Final    Arma Heading, [place] [facility] 11/28/2014, 6:01 PM

## 2014-11-28 NOTE — Progress Notes (Signed)
Inpatient Diabetes Program Recommendations  AACE/ADA: New Consensus Statement on Inpatient Glycemic Control (2013)  Target Ranges:  Prepandial:   less than 140 mg/dL      Peak postprandial:   less than 180 mg/dL (1-2 hours)      Critically ill patients:  140 - 180 mg/dL    Results for Joseph [name], Joseph [name] (MRN [id number]) as of 11/28/2014 11:29  Ref. Range 11/27/2014 00:25 11/27/2014 05:04 11/27/2014 07:48 11/27/2014 11:46 11/27/2014 16:01 11/27/2014 20:57  Glucose-Capillary Latest Ref Range: 65-99 mg/dL 313 (H) 229 (H) 215 (H) 269 (H) 344 (H) 348 (H)    Results for Joseph [name], Joseph [name] (MRN [id number]) as of 11/28/2014 11:29  Ref. Range 11/28/2014 00:22 11/28/2014 05:17 11/28/2014 07:48  Glucose-Capillary Latest Ref Range: 65-99 mg/dL 299 (H) 276 (H) 246 (H)    Home DM Meds: Lantus 50 units daily  Current DM Orders: Novolog Sensitive SSI (0-9 units) Q4 hours    -Note patient receiving IV Solumedrol 1000 mg daily for limbic encephalitis.  -Glucose levels consistently elevated.  -Patient normally takes Lantus 50 units daily at home.    MD- Please consider the following in-hospital insulin adjustments:  1. Start at least 50% of home dose of Lantus- Lantus 25 units daily  2. Increase Novolog SSI to Moderate scale (0-15 units) Q4 hours     Will follow Wyn Quaker RN, MSN, CDE Diabetes Coordinator Inpatient Glycemic Control Team Team Pager: [phone] (8a-5p)

## 2014-11-28 NOTE — Progress Notes (Signed)
ANTICOAGULATION CONSULT NOTE - Follow Up Consult  Pharmacy Consult for Coumadin & phenytoin Indication: Afib & seizure  Allergies  Allergen Reactions  . Dilaudid [Hydromorphone] Nausea And Vomiting    Patient Measurements: Height: 6' (182.9 cm) Weight: 189 lb 9.5 oz (86 kg) (discrempacie noted/ weight pt three times) IBW/kg (Calculated) : 77.6  Vital Signs: Temp: 98.4 F (36.9 C) (08/10 1001) Temp Source: Oral (08/10 1001) BP: 131/71 mmHg (08/10 1001) Pulse Rate: 96 (08/10 1001)  Labs:  Recent Labs  11/26/14 0125  11/26/14 0240 11/26/14 0951 11/26/14 1130 11/26/14 1916 11/27/14 0723 11/28/14 0612  HGB  --   < > 10.4*  --   --   --  10.5* 9.9*  HCT  --   --  30.2*  --   --   --  31.7* 28.9*  PLT  --   --  113*  --   --   --  114* 111*  LABPROT 17.1*  --   --   --   --   --  26.1* 30.3*  INR 1.39  --   --   --   --   --  2.43* 2.96*  HEPARINUNFRC 0.11*  --   --   --  0.43 0.20* 0.47  --   CREATININE  --   --   --  5.00*  --   --   --   --   < > = values in this interval not displayed.  Estimated Creatinine Clearance: 17.5 mL/min (by C-G formula based on Cr of 5).   Assessment: 59 year old male admitted 11/18/2014 with altered mental status. On warfarin PTA for afib and supratherapeutic INR on admission 3.64>6.53. Restarted heparin on 8/5 @1800  and coumadin resumed 8/6. INR jumped to therapeutic at 2.43 and HL therapeutic at 0.47 so heparin stopped. H/H stable, no s/sx of bleeding noted  INR still trending up to 2.96, will continue to hold for now  Goal of Therapy:  INR 2-3 Monitor platelets by anticoagulation protocol: Yes   Plan:  Hold coumadin tonight Monitor daily INR, CBC, s/s of bleed  Dilantin to 300 mg po HS  Thank you. Anette Guarneri, PharmD [phone]   11/28/2014,11:06 AM

## 2014-11-28 NOTE — Progress Notes (Signed)
PATIENT DETAILS Name: Joseph [name] Age: 59 y.o. Sex: male Date of Birth: 1955/09/01 Admit Date: 11/18/2014 Admitting Physician Theodis Blaze, MD TAV:[id number], REGINA, NP  Brief narrative:  59 year old male with history of end-stage renal disease on hemodialysis, recent hospitalization and discharge on 7/22 for C. difficile colitis, presented on 7/31 with altered mental status, diabetic ketoacidosis, and significant leukocytosis. Initially, altered mental status was felt to be from toxic metabolic encephalopathy setting of C. difficile with sepsis and DKA. 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 Neurology was subsequently consulted, underwent lumbar puncture on 8/5-CSF did not show any major abnormalities, neurology now has started the patient on high-dose steroids. See below for further details  Subjective:  Patient in bed, denies headache, no chest abdominal pain. Mildly confused. No focal weakness.  Assessment/Plan:   Acute encephalopathy: Initially suspected to be from toxic metabolic encephalopathy from mild DKA, C. difficile colitis. Since continued to have confusion inspite of improvement in blood sugars and C. 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 Repeat MRI 11-26-14 mild improvement, pending repeat EEG.  Seen by neurology underwent LP which was unimpressive. Initially given a trial of acyclovir which was stopped by neurology.   Currently on Dilantin for his seizures along with high-dose IV Solu-Medrol per neurology. 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  CMV DNA +ve but says <200 will call Lab [phone], D/W ID Dr Wendie Agreste  D/W Dr C.Stewart Neuro 11-28-14  Pending CSF serology - Lyme studies pending. CSF NMDA Ab and paraneoplastic panel pending as well.       C. difficile colitis with sepsis: diarrhea improved, continue with oral vancomycin- started 7/31     Atrial fibrillation with RVR: heart rate much better after adjustment of meds-continue cardizem and metoprolol.  Chads2vasc score of 2, Stop heparin gtt, pharm monitoring Coumadin. Cards following.  Lab Results  Component Value Date   INR 2.96* 11/28/2014   INR 2.43* 11/27/2014   INR 1.39 11/26/2014     Mildly elevated Troponin:secondary to demand ischemia/RVR/CKD. No further recommendations from cards. On beta blocker continue. This was not ACS.  Elevated LFT's: ?etiology-suspect shock liver from sepsis.USG abd and recently questions mild cirrhosis, Hepatitis serology neg. LFT's downtrending, ferritin level was elevated will have outpatient GI follow-up.  End-stage renal disease: On hemodialysis-Monday, Wednesday and Friday. Nephrology following  Gout: Continue with colcichine  Chronic skin rash: I Discussed his case with his dermatologist Dr. Allyson Sabal who describes this as nonspecific dermatitis. Supportive care only.  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 Question paraneoplastic presentation of occult malignancy, will check CT chest abdomen pelvis on 11/27/2014.  DKA in DM2 : 2 outpatient control, DKA has Resolved. 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 We have placed him on glucose stabilizer for now,  monitor CBGs.  CBG (last 3)   Recent Labs  11/28/14 0022 11/28/14 0517 11/28/14 0748  GLUCAP 299* 276* 246*    Lab Results  Component Value Date   HGBA1C 9.4* 11/07/2014      Disposition:  Remain inpatient-home when mental status improves  Antimicrobial agents   See below  Anti-infectives    Start  Dose/Rate Route Frequency Ordered Stop   11/26/14 1200  vancomycin (VANCOCIN) 50 mg/mL oral solution 500 mg      500 mg Oral 4 times per day 11/26/14 0938 11/30/14 1159   11/22/14 1015  acyclovir (ZOVIRAX) 800 mg in dextrose 5 % 150 mL IVPB  Status:  Discontinued     800 mg 166 mL/hr over 60 Minutes Intravenous Every 12 hours 11/22/14 1001 11/22/14 1009   11/22/14 1015  acyclovir (ZOVIRAX) 400 mg in dextrose 5 % 100 mL IVPB  Status:  Discontinued     400 mg 108 mL/hr over 60 Minutes Intravenous Every 24 hours 11/22/14 1010 11/25/14 0758   11/22/14 1000  acyclovir (ZOVIRAX) 200 MG capsule 200 mg  Status:  Discontinued     200 mg Oral 3 times daily 11/22/14 0826 11/22/14 1000   11/19/14 1400  vancomycin (VANCOCIN) 50 mg/mL oral solution 500 mg     500 mg Oral 4 times per day 11/19/14 1001 11/25/14 1359   11/18/14 1400  vancomycin (VANCOCIN) 125 MG capsule 250 mg  Status:  Discontinued     250 mg Oral 4 times daily 11/18/14 1128 11/18/14 1248   11/18/14 1400  vancomycin (VANCOCIN) 50 mg/mL oral solution 125 mg  Status:  Discontinued     125 mg Oral 4 times per day 11/18/14 1248 11/18/14 1320   11/18/14 1400  vancomycin (VANCOCIN) 50 mg/mL oral solution 250 mg  Status:  Discontinued     250 mg Oral 4 times per day 11/18/14 1320 11/19/14 1001      DVT Prophylaxis: Coumadin/Heparin gtt  Code Status: Full code   Family Communication Wife and mother-in-law bedside on 11/27/2014   Procedures:  LP. Showing mildly increased protein levels  EEG - recorded two seizures which appear to arise from the left hemisphere as well as signs of irritability with potential for seizure origination in the right temporal region as well.     CONSULTS:  nephrology , Cards, neurology   Time spent 35 minutes-Greater than 50% of this time was spent in counseling, explanation of diagnosis, planning of further management, and coordination of care.  MEDICATIONS: Scheduled Meds: . antiseptic oral rinse  7 mL Mouth Rinse 6 times per day  . calcitRIOL  1.75 mcg Oral Q M,W,F-HD  . clobetasol cream  1 application  Topical TID  . colchicine  0.3 mg Oral Once per day on Mon Thu  . diltiazem  30 mg Oral 4 times per day  . feeding supplement (NEPRO CARB STEADY)  237 mL Oral BID BM  . insulin aspart  0-9 Units Subcutaneous 6 times per day  . LORazepam  1 mg Intravenous Once  . methylPREDNISolone (SOLU-MEDROL) injection  1,000 mg Intravenous Daily  . metoprolol  50 mg Oral BID  . multivitamin  1 tablet Oral QHS  . pantoprazole  20 mg Oral BID  . phenytoin (DILANTIN) IV  100 mg Intravenous 3 times per day  . saccharomyces boulardii  250 mg Oral BID  . sevelamer carbonate  1,600 mg Oral TID WC  . vancomycin  500 mg Oral 4 times per day  . Warfarin - Pharmacist Dosing Inpatient   Does not apply q1800   Continuous Infusions: . sodium chloride 10 mL/hr at 11/18/14 2300  . sodium chloride 10 mL/hr at 11/27/14 0541   PRN Meds:.sodium chloride, sodium chloride, camphor-menthol, dextrose, feeding supplement (NEPRO CARB STEADY), heparin, heparin, hydrOXYzine, lidocaine (PF), lidocaine-prilocaine, metoprolol, pentafluoroprop-tetrafluoroeth, sodium chloride    PHYSICAL EXAM: Vital  signs in last 24 hours: Filed Vitals:   11/27/14 2257 11/28/14 0011 11/28/14 0518 11/28/14 1001  BP: 128/77 141/84 132/68 131/71  Pulse:  84 87 96  Temp:   98.7 F (37.1 C) 98.4 F (36.9 C)  TempSrc:   Oral Oral  Resp:   18 18  Height:      Weight:      SpO2:  99% 99% 99%    Weight change:  Filed Weights   11/23/14 0743 11/26/14 0546 11/26/14 0920  Weight: 73.4 kg (161 lb 13.1 oz) 74 kg (163 lb 2.3 oz) 86 kg (189 lb 9.5 oz)   Body mass index is 25.71 kg/(m^2).   Gen Exam: Awake/alert-answers few questions appropriately today Neck: Supple, No JVD.   Chest: B/L Clear.   CVS: S1 S2 Regular, no murmurs.  Abdomen: soft, BS +, non tender, non distended.  Extremities: no edema, lower extremities warm to touch. Neurologic: Non Focal.  Skin:papular dry rash all over mostly on the back Wounds: N/A.    Intake/Output from  previous day:  Intake/Output Summary (Last 24 hours) at 11/28/14 1009 Last data filed at 11/27/14 1619  Gross per 24 hour  Intake 517.33 ml  Output      0 ml  Net 517.33 ml     LAB RESULTS: CBC  Recent Labs Lab 11/24/14 0352 11/25/14 0514 11/26/14 0240 11/27/14 0723 11/28/14 0612  WBC 13.6* 12.6* 13.9* 10.3 11.5*  HGB 11.7* 10.8* 10.4* 10.5* 9.9*  HCT 35.3* 32.5* 30.2* 31.7* 28.9*  PLT 136* 111* 113* 114* 111*  MCV 94.9 93.9 91.5 93.0 92.6  MCH 31.5 31.2 31.5 30.8 31.7  MCHC 33.1 33.2 34.4 33.1 34.3  RDW 17.7* 17.7* 17.4* 17.8* 17.4*    Chemistries   Recent Labs Lab 11/22/14 0511 11/23/14 0532 11/24/14 0352 11/25/14 0514 11/26/14 0951  NA 133* 135 137 126* 125*  K 3.7 4.1 4.0 4.5 4.3  CL 97* 96* 98* 91* 90*  CO2 24 24 29 26 22   GLUCOSE 54* 52* 50* 498* 146*  BUN 46* 57* 29* 56* 82*  CREATININE 3.97* 4.68* 3.20* 4.40* 5.00*  CALCIUM 7.6* 7.9* 7.7* 7.7* 8.0*    CBG:  Recent Labs Lab 11/27/14 1601 11/27/14 2057 11/28/14 0022 11/28/14 0517 11/28/14 0748  GLUCAP 344* 348* 299* 276* 246*    GFR Estimated Creatinine Clearance: 17.5 mL/min (by C-G formula based on Cr of 5).  Coagulation profile  Recent Labs Lab 11/24/14 0352 11/25/14 0514 11/26/14 0125 11/27/14 0723 11/28/14 0612  INR 1.29 1.38 1.39 2.43* 2.96*    Cardiac Enzymes  Recent Labs Lab 11/23/14 1615 11/23/14 2112 11/24/14 0352  TROPONINI 0.52* 0.53* 0.51*    Invalid input(s): POCBNP No results for input(s): DDIMER in the last 72 hours. No results for input(s): HGBA1C in the last 72 hours. No results for input(s): CHOL, HDL, LDLCALC, TRIG, CHOLHDL, LDLDIRECT in the last 72 hours. No results for input(s): TSH, T4TOTAL, T3FREE, THYROIDAB in the last 72 hours.  Invalid input(s): FREET3 No results for input(s): VITAMINB12, FOLATE, FERRITIN, TIBC, IRON, RETICCTPCT in the last 72 hours. No results for input(s): LIPASE, AMYLASE in the last 72 hours.  Urine Studies No results  for input(s): UHGB, CRYS in the last 72 hours.  Invalid input(s): UACOL, UAPR, USPG, UPH, UTP, UGL, UKET, UBIL, UNIT, UROB, ULEU, UEPI, UWBC, URBC, UBAC, CAST, UCOM, BILUA  MICROBIOLOGY: Recent Results (from the past 240 hour(s))  Urine culture     Status: None   Collection Time: 11/18/14  11:30 AM  Result Value Ref Range Status   Specimen Description URINE, RANDOM  Final   Special Requests NONE  Final   Culture   Final    >=100,000 COLONIES/mL ENTEROCOCCUS SPECIES 30,000 COLONIES/mL ESCHERICHIA COLI    Report Status 11/21/2014 FINAL  Final   Organism ID, Bacteria ENTEROCOCCUS SPECIES  Final   Organism ID, Bacteria ESCHERICHIA COLI  Final      Susceptibility   Escherichia coli - MIC*    AMPICILLIN >=32 RESISTANT Resistant     CEFAZOLIN >=64 RESISTANT Resistant     CEFTRIAXONE <=1 SENSITIVE Sensitive     CIPROFLOXACIN <=0.25 SENSITIVE Sensitive     GENTAMICIN <=1 SENSITIVE Sensitive     IMIPENEM <=0.25 SENSITIVE Sensitive     NITROFURANTOIN <=16 SENSITIVE Sensitive     TRIMETH/SULFA <=20 SENSITIVE Sensitive     AMPICILLIN/SULBACTAM 8 SENSITIVE Sensitive     PIP/TAZO <=4 SENSITIVE Sensitive     * 30,000 COLONIES/mL ESCHERICHIA COLI   Enterococcus species - MIC*    AMPICILLIN <=2 SENSITIVE Sensitive     LEVOFLOXACIN 1 SENSITIVE Sensitive     NITROFURANTOIN <=16 SENSITIVE Sensitive     VANCOMYCIN 2 SENSITIVE Sensitive     LINEZOLID 1 SENSITIVE Sensitive     * >=100,000 COLONIES/mL ENTEROCOCCUS SPECIES  MRSA PCR Screening     Status: None   Collection Time: 11/18/14 11:30 AM  Result Value Ref Range Status   MRSA by PCR NEGATIVE NEGATIVE Final    Comment:        The GeneXpert MRSA Assay (FDA approved for NASAL specimens only), is one component of a comprehensive MRSA colonization surveillance program. It is not intended to diagnose MRSA infection nor to guide or monitor treatment for MRSA infections.   Clostridium Difficile by PCR (not at [facility])     Status: None    Collection Time: 11/19/14  3:50 PM  Result Value Ref Range Status   Toxigenic C Difficile by pcr NEGATIVE NEGATIVE Final  CSF culture with Stat gram stain     Status: None   Collection Time: 11/23/14  4:30 PM  Result Value Ref Range Status   Specimen Description CSF  Final   Special Requests NONE  Final   Gram Stain   Final    WBC PRESENT, PREDOMINANTLY MONONUCLEAR NO ORGANISMS SEEN CYTOSPIN    Culture NO GROWTH 3 DAYS  Final   Report Status 11/27/2014 FINAL  Final    RADIOLOGY STUDIES/RESULTS: Ct Abdomen Pelvis Wo Contrast  11/27/2014   CLINICAL DATA:  59 year old male with C difficile, elevated white count, altered mental status/encephalitis and diabetic ketoacidosis. Elevated LFTs and end-stage renal disease.  EXAM: CT CHEST, ABDOMEN AND PELVIS WITHOUT CONTRAST  TECHNIQUE: Multidetector CT imaging of the chest, abdomen and pelvis was performed following the standard protocol without IV contrast.  COMPARISON:  11/18/2014 and prior chest radiographs. 11/06/2014 abdominal ultrasound.  FINDINGS: CT CHEST FINDINGS  Mediastinum/Nodes: Mild pulmonary and moderate-heavy coronary artery calcifications noted. There is no evidence of thoracic aortic aneurysm or pericardial effusion. Shotty mediastinal lymph nodes are identified.  Lungs/Pleura: A small right pleural effusion is noted. Mild basilar atelectasis identified. Mild interstitial opacities within bilateral upper lobes noted, some with a tree-in-bud type appearance-question infection. There is no definite airspace disease, consolidation, suspicious nodule, mass or endobronchial/ endotracheal lesion.  Musculoskeletal: No acute or suspicious abnormalities.  CT ABDOMEN AND PELVIS FINDINGS  Please note that parenchymal abnormalities may be missed without intravenous contrast.  Hepatobiliary: The liver and gallbladder are  unremarkable. There is no evidence of biliary dilatation.  Pancreas: Unremarkable  Spleen: Unremarkable  Adrenals/Urinary Tract: Mild  bilateral renal atrophy noted. The adrenal glands and bladder are unremarkable.  Stomach/Bowel: There is no evidence of bowel obstruction or are definite bowel wall thickening. The appendix is normal.  Vascular/Lymphatic: No enlarged lymph nodes or abdominal aortic aneurysm. Mild aortic atherosclerotic calcifications noted.  Reproductive: Prostate unremarkable.  Other: A small amount of ascites within the abdomen and pelvis noted. There is no evidence of pneumoperitoneum. A small umbilical hernia containing fat is identified. Mild subcutaneous edema is present.  Musculoskeletal: No acute or suspicious abnormalities.  IMPRESSION: Mild bilateral upper lobe interstitial opacities some with a tree-in-bud type appearance which may represent infection.  Small right pleural effusion, small amount of ascites and mild subcutaneous edema.  No evidence of bowel wall thickening.   Electronically Signed   By: Margarette Canada M.D.   On: 11/27/2014 15:22   Dg Chest 2 View  11/18/2014   CLINICAL DATA:  Altered mental status. Shortness of breath and nausea today.  EXAM: CHEST  2 VIEW  COMPARISON:  11/05/2014  FINDINGS: Chronic cardiomegaly and vascular congestion, unchanged. Diminished right pleural effusion and basilar opacity from prior exam. No new confluent airspace disease or pneumothorax. No acute osseous abnormalities.  IMPRESSION: 1. Diminished right pleural effusion and right basilar opacity. 2. Stable chronic cardiomegaly and vascular congestion.   Electronically Signed   By: Jeb Levering M.D.   On: 11/18/2014 06:21   Dg Chest 2 View  11/05/2014   CLINICAL DATA:  Dyspnea for 1 day  EXAM: CHEST  2 VIEW  COMPARISON:  July 03, 2014  FINDINGS: The heart size and mediastinal contours are stable. There is mild patchy consolidation of lateral right lung base with small right pleural effusion. There is mild chronic central pulmonary vascular congestion. The visualized skeletal structures are stable.  IMPRESSION: Chronic  central pulmonary vascular congestion.  Small right pleural effusion with associated mild patchy opacity of right lung base which could be due to atelectasis but superimposed pneumonia is not excluded.   Electronically Signed   By: Abelardo Diesel M.D.   On: 11/05/2014 21:54   Ct Head Wo Contrast  11/18/2014   CLINICAL DATA:  Altered mental status.  EXAM: CT HEAD WITHOUT CONTRAST  TECHNIQUE: Contiguous axial images were obtained from the base of the skull through the vertex without intravenous contrast.  COMPARISON:  None.  FINDINGS: No intracranial hemorrhage, mass effect, or midline shift. Small focal encephalomalacia in the left occipital lobe. No hydrocephalus. The basilar cisterns are patent. No evidence of territorial infarct. No intracranial fluid collection. Calvarium is intact. Included paranasal sinuses and mastoid air cells are well aerated.  IMPRESSION: 1.  No acute intracranial abnormality. 2. Small focal encephalomalacia in the left occipital lobe.   Electronically Signed   By: Jeb Levering M.D.   On: 11/18/2014 06:13   Ct Chest Wo Contrast  11/27/2014   CLINICAL DATA:  59 year old male with C difficile, elevated white count, altered mental status/encephalitis and diabetic ketoacidosis. Elevated LFTs and end-stage renal disease.  EXAM: CT CHEST, ABDOMEN AND PELVIS WITHOUT CONTRAST  TECHNIQUE: Multidetector CT imaging of the chest, abdomen and pelvis was performed following the standard protocol without IV contrast.  COMPARISON:  11/18/2014 and prior chest radiographs. 11/06/2014 abdominal ultrasound.  FINDINGS: CT CHEST FINDINGS  Mediastinum/Nodes: Mild pulmonary and moderate-heavy coronary artery calcifications noted. There is no evidence of thoracic aortic aneurysm or pericardial effusion. Shotty mediastinal  lymph nodes are identified.  Lungs/Pleura: A small right pleural effusion is noted. Mild basilar atelectasis identified. Mild interstitial opacities within bilateral upper lobes noted,  some with a tree-in-bud type appearance-question infection. There is no definite airspace disease, consolidation, suspicious nodule, mass or endobronchial/ endotracheal lesion.  Musculoskeletal: No acute or suspicious abnormalities.  CT ABDOMEN AND PELVIS FINDINGS  Please note that parenchymal abnormalities may be missed without intravenous contrast.  Hepatobiliary: The liver and gallbladder are unremarkable. There is no evidence of biliary dilatation.  Pancreas: Unremarkable  Spleen: Unremarkable  Adrenals/Urinary Tract: Mild bilateral renal atrophy noted. The adrenal glands and bladder are unremarkable.  Stomach/Bowel: There is no evidence of bowel obstruction or are definite bowel wall thickening. The appendix is normal.  Vascular/Lymphatic: No enlarged lymph nodes or abdominal aortic aneurysm. Mild aortic atherosclerotic calcifications noted.  Reproductive: Prostate unremarkable.  Other: A small amount of ascites within the abdomen and pelvis noted. There is no evidence of pneumoperitoneum. A small umbilical hernia containing fat is identified. Mild subcutaneous edema is present.  Musculoskeletal: No acute or suspicious abnormalities.  IMPRESSION: Mild bilateral upper lobe interstitial opacities some with a tree-in-bud type appearance which may represent infection.  Small right pleural effusion, small amount of ascites and mild subcutaneous edema.  No evidence of bowel wall thickening.   Electronically Signed   By: Margarette Canada M.D.   On: 11/27/2014 15:22   Mr Brain Wo Contrast  11/26/2014   CLINICAL DATA:  Encephalitis.  Confusion.  EXAM: MRI HEAD WITHOUT CONTRAST  TECHNIQUE: Multiplanar, multiecho pulse sequences of the brain and surrounding structures were obtained without intravenous contrast.  COMPARISON:  MRI of the brain 11/21/2014.  FINDINGS: Restricted diffusion within the hippocampal structures bilaterally is somewhat less conspicuous than on the prior exam. Diffuse T2 signal remains in the medial  temporal lobes. There is increased swelling in the left medial temporal lobe compared to the prior exam. The right hippocampus is similar to the prior exam.  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 Scattered periventricular and subcortical T2 changes are otherwise stable. No other new areas of signal abnormality are evident.  Flow was present in the major intracranial arteries. The globes orbits are intact. The paranasal sinuses are clear. There is some fluid in the inferior mastoid air cells. No obstructing nasopharyngeal lesion is present. Skullbase is within normal limits. Midline structures are unremarkable.  IMPRESSION: 1. Diffusion abnormality in the medial temporal lobes and hippocampal structures is improving. 2. Swelling and T2 signal is more prominent in the left medial temporal lobe and hippocampus. This may be related to seizure activity given the abnormal EEG and negative lumbar puncture. 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   Electronically Signed   By: San Morelle M.D.   On: 11/26/2014 17:14   Mr Brain Wo Contrast  11/21/2014   CLINICAL DATA:  Altered mental status. Encephalopathy. Atrial fibrillation.  EXAM: MRI HEAD WITHOUT CONTRAST  TECHNIQUE: Multiplanar, multiecho pulse sequences of the brain and surrounding structures were obtained without intravenous contrast.  COMPARISON:  CT head without contrast from the same day.  FINDINGS: Restricted diffusion and increased T2 signal is present within the medial temporal lobes and hippocampal structures bilaterally. Inferior temporal lobes are intact.  Moderate generalized atrophy and diffuse periventricular T2 changes bilaterally are advanced for age. The ventricles are of normal size.  Flow is present in the major intracranial arteries. The globes and orbits are intact. The paranasal sinuses are clear. There is some  fluid in the right mastoid air cells. No obstructing nasopharyngeal lesion is evident.   IMPRESSION: Abnormal T2 signal and restricted diffusion within the medial temporal lobes and hippocampus bilaterally. 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 There is no hemorrhage or signal change along the undersurface the temporal lobes, but herpes encephalitis is also considered. Status epilepticus is also on the differential diagnosis.  Atrophy in T2 signal changes are advanced for age. This is nonspecific, but likely reflects the sequela of chronic microvascular ischemia.  These results will be called to the ordering clinician or representative by the Radiologist Assistant, and communication documented in the PACS or zVision Dashboard.   Electronically Signed   By: San Morelle M.D.   On: 11/21/2014 19:01   US Abdomen Complete  11/06/2014   CLINICAL DATA:  Abnormal liver function tests  EXAM: ULTRASOUND ABDOMEN COMPLETE  COMPARISON:  None.  FINDINGS: Gallbladder: No gallstones or wall thickening visualized. No sonographic Murphy sign noted.  Common bile duct: Diameter: 4 mm  Liver: Minimally nodular contour with no focal abnormalities.  IVC: No abnormality visualized.  Pancreas: Visualized portion unremarkable.  Spleen: Size and appearance within normal limits.  Right Kidney: Length: 10.7 cm. Midpole cyst measuring 14 mm. Mildly increased echogenicity.  Left Kidney: Length: 10.8 cm. Lower pole cyst measures 1 cm. Mildly increased echogenicity.  Abdominal aorta: No aneurysm identified. Limited visualization distally due to bowel gas.  Other findings: Right pleural effusion.  Small volume ascites.  IMPRESSION: Mildly nodular liver contour with small volume of ascites and right pleural effusion. Possibility of cirrhosis not excluded.  Evidence of medical renal disease.   Electronically Signed   By: Skipper Cliche M.D.   On: 11/06/2014 21:38   Ir US Guide Vasc Access Right  11/26/2014   CLINICAL DATA:  Atrial fibrillation  EXAM: RIGHT JUGULAR TUNNELED PICC LINE PLACEMENT WITH ULTRASOUND  AND FLUOROSCOPIC GUIDANCE  FLUOROSCOPY TIME:  18 seconds in  PROCEDURE: The patient was advised of the possible risks andcomplications and agreed to undergo the procedure. The patient was then brought to the angiographic suite for the procedure.  The right neck was prepped with chlorhexidine, drapedin the usual sterile fashion using maximum barrier technique (cap and mask, sterile gown, sterile gloves, large sterile sheet, hand hygiene and cutaneous antisepsis) and infiltrated locally with 1% Lidocaine.  Ultrasound demonstrated patency of the right internal jugular vein, and this was documented with an image. Under real-time ultrasound guidance, this vein was accessed with a 21 gauge micropuncture needle and image documentation was performed. A 0.018 wire was introduced in to the vein. Over this, a 5 [place] double lumen Power tunneled PICC was advanced to the lower SVC/right atrial junction. The cuff was positioned in the subcutaneous tract. Fluoroscopy during the procedure and fluoro spot radiograph confirms appropriate catheter position. The catheter was flushed and covered with asterile dressing.  Catheter length: 21 cm  COMPLICATIONS: None  IMPRESSION: Successful right jugular tunneled power PICC line placement with ultrasound and fluoroscopic guidance. The catheter is ready for use.   Electronically Signed   By: Marybelle Killings M.D.   On: 11/26/2014 16:15   Ir Fluoro Guide Cv Midline Picc Right  11/26/2014   CLINICAL DATA:  Atrial fibrillation  EXAM: RIGHT JUGULAR TUNNELED PICC LINE PLACEMENT WITH ULTRASOUND AND FLUOROSCOPIC GUIDANCE  FLUOROSCOPY TIME:  18 seconds in  PROCEDURE: The patient was advised of the possible risks andcomplications and agreed to undergo the procedure. The patient was then brought to the angiographic  suite for the procedure.  The right neck was prepped with chlorhexidine, drapedin the usual sterile fashion using maximum barrier technique (cap and mask, sterile gown, sterile gloves, large  sterile sheet, hand hygiene and cutaneous antisepsis) and infiltrated locally with 1% Lidocaine.  Ultrasound demonstrated patency of the right internal jugular vein, and this was documented with an image. Under real-time ultrasound guidance, this vein was accessed with a 21 gauge micropuncture needle and image documentation was performed. A 0.018 wire was introduced in to the vein. Over this, a 5 [place] double lumen Power tunneled PICC was advanced to the lower SVC/right atrial junction. The cuff was positioned in the subcutaneous tract. Fluoroscopy during the procedure and fluoro spot radiograph confirms appropriate catheter position. The catheter was flushed and covered with asterile dressing.  Catheter length: 21 cm  COMPLICATIONS: None  IMPRESSION: Successful right jugular tunneled power PICC line placement with ultrasound and fluoroscopic guidance. The catheter is ready for use.   Electronically Signed   By: Marybelle Killings M.D.   On: 11/26/2014 16:15    Thurnell Lose, MD  [facility] Pager:[phone]  If 7PM-7AM, please contact night-coverage [email or web address] Password [email or web address] 11/28/2014, 10:09 AM   LOS: 10 days

## 2014-11-29 DIAGNOSIS — I4891 Unspecified atrial fibrillation: Secondary | ICD-10-CM

## 2014-11-29 LAB — GLUCOSE, CAPILLARY
GLUCOSE-CAPILLARY: 173 mg/dL — AB (ref 65–99)
GLUCOSE-CAPILLARY: 179 mg/dL — AB (ref 65–99)
GLUCOSE-CAPILLARY: 232 mg/dL — AB (ref 65–99)
GLUCOSE-CAPILLARY: 261 mg/dL — AB (ref 65–99)
GLUCOSE-CAPILLARY: 358 mg/dL — AB (ref 65–99)
Glucose-Capillary: 251 mg/dL — ABNORMAL HIGH (ref 65–99)
Glucose-Capillary: 255 mg/dL — ABNORMAL HIGH (ref 65–99)
Glucose-Capillary: 323 mg/dL — ABNORMAL HIGH (ref 65–99)

## 2014-11-29 LAB — HEPARIN LEVEL (UNFRACTIONATED): HEPARIN UNFRACTIONATED: 0.26 [IU]/mL — AB (ref 0.30–0.70)

## 2014-11-29 LAB — CBC
HEMATOCRIT: 31.2 % — AB (ref 39.0–52.0)
HEMOGLOBIN: 10.5 g/dL — AB (ref 13.0–17.0)
MCH: 31.7 pg (ref 26.0–34.0)
MCHC: 33.7 g/dL (ref 30.0–36.0)
MCV: 94.3 fL (ref 78.0–100.0)
PLATELETS: 115 10*3/uL — AB (ref 150–400)
RBC: 3.31 MIL/uL — ABNORMAL LOW (ref 4.22–5.81)
RDW: 17.6 % — ABNORMAL HIGH (ref 11.5–15.5)
WBC: 13.2 10*3/uL — AB (ref 4.0–10.5)

## 2014-11-29 LAB — PROTIME-INR
INR: 1.93 — AB (ref 0.00–1.49)
PROTHROMBIN TIME: 22 s — AB (ref 11.6–15.2)

## 2014-11-29 MED ORDER — INSULIN ASPART 100 UNIT/ML ~~LOC~~ SOLN: 0.0000 [IU] | Freq: Every day | SUBCUTANEOUS | Status: DC

## 2014-11-29 MED ORDER — HEPARIN (PORCINE) IN NACL 100-0.45 UNIT/ML-% IJ SOLN
1550.0000 [IU]/h | INTRAMUSCULAR | Status: DC
  Administered 2014-11-29: 1200 [IU]/h via INTRAVENOUS
  Administered 2014-11-30 (×2): 1350 [IU]/h via INTRAVENOUS
  Administered 2014-12-02: 1550 [IU]/h via INTRAVENOUS
  Filled 2014-11-29 (×5): qty 250

## 2014-11-29 MED ORDER — WARFARIN SODIUM 5 MG PO TABS: 5.0000 mg | ORAL_TABLET | Freq: Once | ORAL | Status: DC

## 2014-11-29 MED ORDER — INSULIN GLARGINE 100 UNIT/ML ~~LOC~~ SOLN
25.0000 [IU] | Freq: Two times a day (BID) | SUBCUTANEOUS | Status: DC
  Administered 2014-11-29 – 2014-12-01 (×5): 25 [IU] via SUBCUTANEOUS
  Filled 2014-11-29 (×8): qty 0.25

## 2014-11-29 MED ORDER — INSULIN ASPART 100 UNIT/ML ~~LOC~~ SOLN
0.0000 [IU] | Freq: Three times a day (TID) | SUBCUTANEOUS | Status: DC
  Administered 2014-11-29: 5 [IU] via SUBCUTANEOUS
  Administered 2014-11-29: 8 [IU] via SUBCUTANEOUS
  Administered 2014-12-02: 2 [IU] via SUBCUTANEOUS

## 2014-11-29 NOTE — Clinical Social Work Note (Signed)
Bed offers given to patient's wife at bedside.   Liz Beach MSW, [place], [place], [phone]

## 2014-11-29 NOTE — Progress Notes (Signed)
Subjective: Patient had no complaints. No overnight adverse clinical events reported.  Objective: Current vital signs: BP 134/74 mmHg  Pulse 93  Temp(Src) 98.5 F (36.9 C) (Oral)  Resp 20  Ht 6' (1.829 m)  Wt 80.5 kg (177 lb 7.5 oz)  BMI 24.06 kg/m2  SpO2 99%  Neurologic Exam: Patient was alert and in no acute distress. He was disoriented to time as well as to his correct age. He was oriented to place. Extraocular movements were full and conjugate. Visual fields were intact and normal. No facial weakness was noted. Speech was normal. Patient moved extremities equally with symmetrical strength. Muscle tone was normal throughout. Minimal asterixis was noted with extension of upper extremities. Coordination was normal bilaterally with finger-nose testing.  Medications: I have reviewed the patient's current medications.  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 He is no longer having indications of seizure activity. His last MRI on 11/26/2014 showed improvement and changes involving temporal lobes and hippocampi. He has finished a course of Solu-Medrol for 5 days and improvement in his mental status, although he is still moderately confused.  I'm recommending no changes in current management. I agree with the need for probable SNF placement, and possibly returning home if his mental status improves sufficiently. Also recommend continuing Dilantin 100 mg 3 times a day.  C.R. Nicole Kindred, MD [facility] [phone]  11/29/2014  8:53 AM

## 2014-11-29 NOTE — Progress Notes (Addendum)
ANTICOAGULATION CONSULT NOTE - Follow Up Consult  Pharmacy Consult for Heparin Indication: Afib  Allergies  Allergen Reactions  . Dilaudid [Hydromorphone] Nausea And Vomiting    Patient Measurements: Height: 6' (182.9 cm) Weight: 177 lb 7.5 oz (80.5 kg) IBW/kg (Calculated) : 77.6  Vital Signs: Temp: 98.5 F (36.9 C) (08/11 0504) Temp Source: Oral (08/11 0504) BP: 134/74 mmHg (08/11 0504) Pulse Rate: 93 (08/11 0504)  Labs:  Recent Labs  11/26/14 0951 11/26/14 1130 11/26/14 1916  11/27/14 0723 11/28/14 0612 11/28/14 1641 11/28/14 2112 11/29/14 0558  HGB  --   --   --   < > 10.5* 9.9*  --  10.9* 10.5*  HCT  --   --   --   < > 31.7* 28.9*  --  31.8* 31.2*  PLT  --   --   --   < > 114* 111*  --  117* 115*  LABPROT  --   --   --   --  26.1* 30.3*  --   --  22.0*  INR  --   --   --   --  2.43* 2.96*  --   --  1.93*  HEPARINUNFRC  --  0.43 0.20*  --  0.47  --   --   --   --   CREATININE 5.00*  --   --   --   --   --  5.02*  --   --   < > = values in this interval not displayed.  Estimated Creatinine Clearance: 17.4 mL/min (by C-G formula based on Cr of 5.02).   Assessment: 59 year old male admitted 11/18/2014 with altered mental status. On warfarin PTA for afib and supratherapeutic INR on admission 3.64>6.53. Restarted heparin on 8/5 @1800  and coumadin resumed 8/6. INR jumped to therapeutic at 2.43 and HL therapeutic at 0.47 so heparin stopped. H/H stable, no s/sx of bleeding noted  INR is now at the lower limit of normal 1.93 after holding two doses. Will start heparin with INR < 2.0.  Goal of Therapy:  INR 2-3 Monitor platelets by anticoagulation protocol: Yes   Plan:  Heparin 1200 units/hr 6h HL Daily HL/CBC/INR Monitor s/sx of bleeding  Andrey Cota. Diona Foley, PharmD Clinical Pharmacist Pager [phone]   11/29/2014,8:43 AM

## 2014-11-29 NOTE — Progress Notes (Signed)
PATIENT DETAILS Name: Joseph [name] Age: 59 y.o. Sex: male Date of Birth: [birth year] Admit Date: 11/18/2014 Admitting Physician Theodis Blaze, MD LKG:[id number], REGINA, NP  Brief narrative:  59 year old male with history of end-stage renal disease on hemodialysis, recent hospitalization and discharge on 7/22 for C. difficile colitis, presented on 7/31 with altered mental status, diabetic ketoacidosis, and significant leukocytosis. Initially, altered mental status was felt to be from toxic metabolic encephalopathy setting of C. difficile with sepsis and DKA. 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 Neurology was subsequently consulted, underwent lumbar puncture on 8/5-CSF did not show any major abnormalities, neurology now has started the patient on high-dose steroids. See below for further details  Subjective:  Patient in bed, denies headache, no chest abdominal pain. Mildly confused. No focal weakness.  Assessment/Plan:   Acute encephalopathy: Initially suspected to be from toxic metabolic encephalopathy from mild DKA, C. difficile colitis. Since continued to have confusion inspite of improvement in blood sugars and C. 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 Repeat MRI 11-26-14 mild improvement, pending repeat EEG.  Seen by neurology underwent LP which was unimpressive. Initially given a trial of acyclovir which was stopped by neurology.   Currently on Dilantin for his seizures he has finished his 5 day course of 1 g IV Solu-Medrol. 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 ID has been consult it. ID recommends stopping IV steroids which patient has already finished the course of. Repeat LP requested by ID. I have  informed neurology of the same on 11-29-14.  Pending CSF serology - Lyme studies pending. CSF NMDA Ab and paraneoplastic panel pending as well.      C. difficile colitis with sepsis: diarrhea improved, treated with high-dose oral vancomycin- started 7/31  will finish his course on 11/30/2014.   Atrial fibrillation with RVR: heart rate much better after adjustment of meds-continue cardizem and metoprolol.  Chads2vasc score of 2,   since repeat LP has been requested and will hold Coumadin and place on heparin drip until LP is obtained.   Lab Results  Component Value Date   INR 1.93* 11/29/2014   INR 2.96* 11/28/2014   INR 2.43* 11/27/2014     Mildly elevated Troponin:secondary to demand ischemia/RVR/CKD. No further recommendations from cards. On beta blocker continue. This was not ACS.   Elevated LFT's: ?etiology-suspect shock liver from sepsis.USG abd and recently questions mild cirrhosis, Hepatitis serology neg. LFT's downtrending, ferritin level was elevated will have outpatient GI follow-up.   End-stage renal disease: On hemodialysis-Monday, Wednesday and Friday. Nephrology following   Gout: Continue with colcichine   Chronic skin rash: I Discussed his case with his dermatologist Dr. Allyson Sabal who describes this as nonspecific dermatitis. Supportive care only.   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 Question paraneoplastic presentation of occult malignancy, will check CT chest abdomen pelvis on 11/27/2014.   DKA in DM2 : poor outpatient control, DKA has resolved, he was kept on glucose stabilizer here while he was taking 1 g IV Solu-Medrol a daily basis. He has finished his steroids course and will be transitioned to Lantus twice a day along with sliding scale  on 11/29/2014 with monitor CBGs closely.  CBG (last 3)   Recent Labs  11/29/14 0136 11/29/14 0503 11/29/14 0739  GLUCAP 358*  255* 323*    Lab Results  Component Value Date   HGBA1C 9.4* 11/07/2014      Disposition:  Remain inpatient-home when mental status improves  Antimicrobial agents   See below  Anti-infectives    Start     Dose/Rate Route Frequency Ordered Stop   11/26/14 1200  vancomycin (VANCOCIN) 50 mg/mL oral solution 500 mg     500 mg Oral 4 times per day 11/26/14 0938 11/30/14 1159   11/22/14 1015  acyclovir (ZOVIRAX) 800 mg in dextrose 5 % 150 mL IVPB  Status:  Discontinued     800 mg 166 mL/hr over 60 Minutes Intravenous Every 12 hours 11/22/14 1001 11/22/14 1009   11/22/14 1015  acyclovir (ZOVIRAX) 400 mg in dextrose 5 % 100 mL IVPB  Status:  Discontinued     400 mg 108 mL/hr over 60 Minutes Intravenous Every 24 hours 11/22/14 1010 11/25/14 0758   11/22/14 1000  acyclovir (ZOVIRAX) 200 MG capsule 200 mg  Status:  Discontinued     200 mg Oral 3 times daily 11/22/14 0826 11/22/14 1000   11/19/14 1400  vancomycin (VANCOCIN) 50 mg/mL oral solution 500 mg     500 mg Oral 4 times per day 11/19/14 1001 11/25/14 1359   11/18/14 1400  vancomycin (VANCOCIN) 125 MG capsule 250 mg  Status:  Discontinued     250 mg Oral 4 times daily 11/18/14 1128 11/18/14 1248   11/18/14 1400  vancomycin (VANCOCIN) 50 mg/mL oral solution 125 mg  Status:  Discontinued     125 mg Oral 4 times per day 11/18/14 1248 11/18/14 1320   11/18/14 1400  vancomycin (VANCOCIN) 50 mg/mL oral solution 250 mg  Status:  Discontinued     250 mg Oral 4 times per day 11/18/14 1320 11/19/14 1001      DVT Prophylaxis: Coumadin/Heparin gtt  Code Status: Full code   Family Communication Wife and mother-in-law bedside on 11/27/2014   Procedures:  LP. Showing mildly increased protein levels  EEG - recorded two seizures which appear to arise from the left hemisphere as well as signs of irritability with potential for seizure origination in the right temporal region as well.     CONSULTS:  nephrology , Cards, neurology,  ID   Time spent 35 minutes-Greater than 50% of this time was spent in counseling, explanation of diagnosis, planning of further management, and coordination of care.  MEDICATIONS: Scheduled Meds: . antiseptic oral rinse  7 mL Mouth Rinse 6 times per day  . calcitRIOL  1.75 mcg Oral Q M,W,F-HD  . clobetasol cream  1 application Topical TID  . colchicine  0.3 mg Oral Once per day on Mon Thu  . diltiazem  30 mg Oral 4 times per day  . feeding supplement (NEPRO CARB STEADY)  237 mL Oral BID BM  . insulin aspart  0-9 Units Subcutaneous 6 times per day  . LORazepam  1 mg Intravenous Once  . metoprolol  50 mg Oral BID  . multivitamin  1 tablet Oral QHS  . pantoprazole  20 mg Oral BID  . phenytoin  300 mg Oral QHS  . saccharomyces boulardii  250 mg Oral BID  . sevelamer carbonate  1,600 mg Oral TID WC  . vancomycin  500 mg Oral 4 times per day   Continuous Infusions: . sodium chloride 10  mL/hr at 11/18/14 2300  . sodium chloride 10 mL/hr at 11/27/14 0541  . heparin     PRN Meds:.sodium chloride, sodium chloride, camphor-menthol, dextrose, feeding supplement (NEPRO CARB STEADY), heparin, heparin, hydrOXYzine, lidocaine (PF), lidocaine-prilocaine, metoprolol, pentafluoroprop-tetrafluoroeth, sodium chloride    PHYSICAL EXAM: Vital signs in last 24 hours: Filed Vitals:   11/28/14 1945 11/28/14 2057 11/29/14 0127 11/29/14 0504  BP: 159/84 136/64 122/67 134/74  Pulse: 94 78 81 93  Temp: 97.6 F (36.4 C) 98.1 F (36.7 C) 98.7 F (37.1 C) 98.5 F (36.9 C)  TempSrc: Oral Oral Oral Oral  Resp: 12 20 20 20   Height:      Weight: 80.5 kg (177 lb 7.5 oz)     SpO2: 98% 98% 96% 99%    Weight change:  Filed Weights   11/26/14 0546 11/26/14 0920 11/28/14 1945  Weight: 74 kg (163 lb 2.3 oz) 86 kg (189 lb 9.5 oz) 80.5 kg (177 lb 7.5 oz)   Body mass index is 24.06 kg/(m^2).   Gen Exam: Awake/alert-answers few questions appropriately today Neck: Supple, No JVD.   Chest: B/L Clear.    CVS: S1 S2 Regular, no murmurs.  Abdomen: soft, BS +, non tender, non distended.  Extremities: no edema, lower extremities warm to touch. Neurologic: Non Focal.  Skin:papular dry rash all over mostly on the back Wounds: N/A.    Intake/Output from previous day:  Intake/Output Summary (Last 24 hours) at 11/29/14 1050 Last data filed at 11/29/14 0942  Gross per 24 hour  Intake    360 ml  Output   2600 ml  Net  -2240 ml     LAB RESULTS: CBC  Recent Labs Lab 11/26/14 0240 11/27/14 0723 11/28/14 0612 11/28/14 2112 11/29/14 0558  WBC 13.9* 10.3 11.5* 14.0* 13.2*  HGB 10.4* 10.5* 9.9* 10.9* 10.5*  HCT 30.2* 31.7* 28.9* 31.8* 31.2*  PLT 113* 114* 111* 117* 115*  MCV 91.5 93.0 92.6 91.6 94.3  MCH 31.5 30.8 31.7 31.4 31.7  MCHC 34.4 33.1 34.3 34.3 33.7  RDW 17.4* 17.8* 17.4* 17.5* 17.6*    Chemistries   Recent Labs Lab 11/23/14 0532 11/24/14 0352 11/25/14 0514 11/26/14 0951 11/28/14 1641  NA 135 137 126* 125* 126*  K 4.1 4.0 4.5 4.3 4.7  CL 96* 98* 91* 90* 93*  CO2 24 29 26 22  21*  GLUCOSE 52* 50* 498* 146* 322*  BUN 57* 29* 56* 82* 89*  CREATININE 4.68* 3.20* 4.40* 5.00* 5.02*  CALCIUM 7.9* 7.7* 7.7* 8.0* 8.0*    CBG:  Recent Labs Lab 11/28/14 1207 11/28/14 2152 11/29/14 0136 11/29/14 0503 11/29/14 0739  GLUCAP 244* 251* 358* 255* 323*    GFR Estimated Creatinine Clearance: 17.4 mL/min (by C-G formula based on Cr of 5.02).  Coagulation profile  Recent Labs Lab 11/25/14 0514 11/26/14 0125 11/27/14 0723 11/28/14 0612 11/29/14 0558  INR 1.38 1.39 2.43* 2.96* 1.93*    Cardiac Enzymes  Recent Labs Lab 11/23/14 1615 11/23/14 2112 11/24/14 0352  TROPONINI 0.52* 0.53* 0.51*    Invalid input(s): POCBNP No results for input(s): DDIMER in the last 72 hours. No results for input(s): HGBA1C in the last 72 hours. No results for input(s): CHOL, HDL, LDLCALC, TRIG, CHOLHDL, LDLDIRECT in the last 72 hours. No results for input(s): TSH, T4TOTAL,  T3FREE, THYROIDAB in the last 72 hours.  Invalid input(s): FREET3 No results for input(s): VITAMINB12, FOLATE, FERRITIN, TIBC, IRON, RETICCTPCT in the last 72 hours. No results for input(s): LIPASE, AMYLASE in the last  72 hours.  Urine Studies No results for input(s): UHGB, CRYS in the last 72 hours.  Invalid input(s): UACOL, UAPR, USPG, UPH, UTP, UGL, UKET, UBIL, UNIT, UROB, ULEU, UEPI, UWBC, URBC, UBAC, CAST, UCOM, BILUA  MICROBIOLOGY: Recent Results (from the past 240 hour(s))  Clostridium Difficile by PCR (not at [facility])     Status: None   Collection Time: 11/19/14  3:50 PM  Result Value Ref Range Status   Toxigenic C Difficile by pcr NEGATIVE NEGATIVE Final  CSF culture with Stat gram stain     Status: None   Collection Time: 11/23/14  4:30 PM  Result Value Ref Range Status   Specimen Description CSF  Final   Special Requests NONE  Final   Gram Stain   Final    WBC PRESENT, PREDOMINANTLY MONONUCLEAR NO ORGANISMS SEEN CYTOSPIN    Culture NO GROWTH 3 DAYS  Final   Report Status 11/27/2014 FINAL  Final    RADIOLOGY STUDIES/RESULTS: Ct Abdomen Pelvis Wo Contrast  11/27/2014   CLINICAL DATA:  59 year old male with C difficile, elevated white count, altered mental status/encephalitis and diabetic ketoacidosis. Elevated LFTs and end-stage renal disease.  EXAM: CT CHEST, ABDOMEN AND PELVIS WITHOUT CONTRAST  TECHNIQUE: Multidetector CT imaging of the chest, abdomen and pelvis was performed following the standard protocol without IV contrast.  COMPARISON:  11/18/2014 and prior chest radiographs. 11/06/2014 abdominal ultrasound.  FINDINGS: CT CHEST FINDINGS  Mediastinum/Nodes: Mild pulmonary and moderate-heavy coronary artery calcifications noted. There is no evidence of thoracic aortic aneurysm or pericardial effusion. Shotty mediastinal lymph nodes are identified.  Lungs/Pleura: A small right pleural effusion is noted. Mild basilar atelectasis identified. Mild interstitial opacities within  bilateral upper lobes noted, some with a tree-in-bud type appearance-question infection. There is no definite airspace disease, consolidation, suspicious nodule, mass or endobronchial/ endotracheal lesion.  Musculoskeletal: No acute or suspicious abnormalities.  CT ABDOMEN AND PELVIS FINDINGS  Please note that parenchymal abnormalities may be missed without intravenous contrast.  Hepatobiliary: The liver and gallbladder are unremarkable. There is no evidence of biliary dilatation.  Pancreas: Unremarkable  Spleen: Unremarkable  Adrenals/Urinary Tract: Mild bilateral renal atrophy noted. The adrenal glands and bladder are unremarkable.  Stomach/Bowel: There is no evidence of bowel obstruction or are definite bowel wall thickening. The appendix is normal.  Vascular/Lymphatic: No enlarged lymph nodes or abdominal aortic aneurysm. Mild aortic atherosclerotic calcifications noted.  Reproductive: Prostate unremarkable.  Other: A small amount of ascites within the abdomen and pelvis noted. There is no evidence of pneumoperitoneum. A small umbilical hernia containing fat is identified. Mild subcutaneous edema is present.  Musculoskeletal: No acute or suspicious abnormalities.  IMPRESSION: Mild bilateral upper lobe interstitial opacities some with a tree-in-bud type appearance which may represent infection.  Small right pleural effusion, small amount of ascites and mild subcutaneous edema.  No evidence of bowel wall thickening.   Electronically Signed   By: Margarette Canada M.D.   On: 11/27/2014 15:22   Dg Chest 2 View  11/18/2014   CLINICAL DATA:  Altered mental status. Shortness of breath and nausea today.  EXAM: CHEST  2 VIEW  COMPARISON:  11/05/2014  FINDINGS: Chronic cardiomegaly and vascular congestion, unchanged. Diminished right pleural effusion and basilar opacity from prior exam. No new confluent airspace disease or pneumothorax. No acute osseous abnormalities.  IMPRESSION: 1. Diminished right pleural effusion and  right basilar opacity. 2. Stable chronic cardiomegaly and vascular congestion.   Electronically Signed   By: Jeb Levering M.D.   On: 11/18/2014  06:21   Dg Chest 2 View  11/05/2014   CLINICAL DATA:  Dyspnea for 1 day  EXAM: CHEST  2 VIEW  COMPARISON:  July 03, 2014  FINDINGS: The heart size and mediastinal contours are stable. There is mild patchy consolidation of lateral right lung base with small right pleural effusion. There is mild chronic central pulmonary vascular congestion. The visualized skeletal structures are stable.  IMPRESSION: Chronic central pulmonary vascular congestion.  Small right pleural effusion with associated mild patchy opacity of right lung base which could be due to atelectasis but superimposed pneumonia is not excluded.   Electronically Signed   By: Abelardo Diesel M.D.   On: 11/05/2014 21:54   Ct Head Wo Contrast  11/18/2014   CLINICAL DATA:  Altered mental status.  EXAM: CT HEAD WITHOUT CONTRAST  TECHNIQUE: Contiguous axial images were obtained from the base of the skull through the vertex without intravenous contrast.  COMPARISON:  None.  FINDINGS: No intracranial hemorrhage, mass effect, or midline shift. Small focal encephalomalacia in the left occipital lobe. No hydrocephalus. The basilar cisterns are patent. No evidence of territorial infarct. No intracranial fluid collection. Calvarium is intact. Included paranasal sinuses and mastoid air cells are well aerated.  IMPRESSION: 1.  No acute intracranial abnormality. 2. Small focal encephalomalacia in the left occipital lobe.   Electronically Signed   By: Jeb Levering M.D.   On: 11/18/2014 06:13   Ct Chest Wo Contrast  11/27/2014   CLINICAL DATA:  59 year old male with C difficile, elevated white count, altered mental status/encephalitis and diabetic ketoacidosis. Elevated LFTs and end-stage renal disease.  EXAM: CT CHEST, ABDOMEN AND PELVIS WITHOUT CONTRAST  TECHNIQUE: Multidetector CT imaging of the chest, abdomen and  pelvis was performed following the standard protocol without IV contrast.  COMPARISON:  11/18/2014 and prior chest radiographs. 11/06/2014 abdominal ultrasound.  FINDINGS: CT CHEST FINDINGS  Mediastinum/Nodes: Mild pulmonary and moderate-heavy coronary artery calcifications noted. There is no evidence of thoracic aortic aneurysm or pericardial effusion. Shotty mediastinal lymph nodes are identified.  Lungs/Pleura: A small right pleural effusion is noted. Mild basilar atelectasis identified. Mild interstitial opacities within bilateral upper lobes noted, some with a tree-in-bud type appearance-question infection. There is no definite airspace disease, consolidation, suspicious nodule, mass or endobronchial/ endotracheal lesion.  Musculoskeletal: No acute or suspicious abnormalities.  CT ABDOMEN AND PELVIS FINDINGS  Please note that parenchymal abnormalities may be missed without intravenous contrast.  Hepatobiliary: The liver and gallbladder are unremarkable. There is no evidence of biliary dilatation.  Pancreas: Unremarkable  Spleen: Unremarkable  Adrenals/Urinary Tract: Mild bilateral renal atrophy noted. The adrenal glands and bladder are unremarkable.  Stomach/Bowel: There is no evidence of bowel obstruction or are definite bowel wall thickening. The appendix is normal.  Vascular/Lymphatic: No enlarged lymph nodes or abdominal aortic aneurysm. Mild aortic atherosclerotic calcifications noted.  Reproductive: Prostate unremarkable.  Other: A small amount of ascites within the abdomen and pelvis noted. There is no evidence of pneumoperitoneum. A small umbilical hernia containing fat is identified. Mild subcutaneous edema is present.  Musculoskeletal: No acute or suspicious abnormalities.  IMPRESSION: Mild bilateral upper lobe interstitial opacities some with a tree-in-bud type appearance which may represent infection.  Small right pleural effusion, small amount of ascites and mild subcutaneous edema.  No evidence of  bowel wall thickening.   Electronically Signed   By: Margarette Canada M.D.   On: 11/27/2014 15:22   Mr Brain Wo Contrast  11/26/2014   CLINICAL DATA:  Encephalitis.  Confusion.  EXAM: MRI HEAD WITHOUT CONTRAST  TECHNIQUE: Multiplanar, multiecho pulse sequences of the brain and surrounding structures were obtained without intravenous contrast.  COMPARISON:  MRI of the brain 11/21/2014.  FINDINGS: Restricted diffusion within the hippocampal structures bilaterally is somewhat less conspicuous than on the prior exam. Diffuse T2 signal remains in the medial temporal lobes. There is increased swelling in the left medial temporal lobe compared to the prior exam. The right hippocampus is similar to the prior exam.  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 Scattered periventricular and subcortical T2 changes are otherwise stable. No other new areas of signal abnormality are evident.  Flow was present in the major intracranial arteries. The globes orbits are intact. The paranasal sinuses are clear. There is some fluid in the inferior mastoid air cells. No obstructing nasopharyngeal lesion is present. Skullbase is within normal limits. Midline structures are unremarkable.  IMPRESSION: 1. Diffusion abnormality in the medial temporal lobes and hippocampal structures is improving. 2. Swelling and T2 signal is more prominent in the left medial temporal lobe and hippocampus. This may be related to seizure activity given the abnormal EEG and negative lumbar puncture. 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   Electronically Signed   By: San Morelle M.D.   On: 11/26/2014 17:14   Mr Brain Wo Contrast  11/21/2014   CLINICAL DATA:  Altered mental status. Encephalopathy. Atrial fibrillation.  EXAM: MRI HEAD WITHOUT CONTRAST  TECHNIQUE: Multiplanar, multiecho pulse sequences of the brain and surrounding structures were obtained without intravenous contrast.  COMPARISON:  CT head without contrast from the  same day.  FINDINGS: Restricted diffusion and increased T2 signal is present within the medial temporal lobes and hippocampal structures bilaterally. Inferior temporal lobes are intact.  Moderate generalized atrophy and diffuse periventricular T2 changes bilaterally are advanced for age. The ventricles are of normal size.  Flow is present in the major intracranial arteries. The globes and orbits are intact. The paranasal sinuses are clear. There is some fluid in the right mastoid air cells. No obstructing nasopharyngeal lesion is evident.  IMPRESSION: Abnormal T2 signal and restricted diffusion within the medial temporal lobes and hippocampus bilaterally. 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 There is no hemorrhage or signal change along the undersurface the temporal lobes, but herpes encephalitis is also considered. Status epilepticus is also on the differential diagnosis.  Atrophy in T2 signal changes are advanced for age. This is nonspecific, but likely reflects the sequela of chronic microvascular ischemia.  These results will be called to the ordering clinician or representative by the Radiologist Assistant, and communication documented in the PACS or zVision Dashboard.   Electronically Signed   By: San Morelle M.D.   On: 11/21/2014 19:01   US Abdomen Complete  11/06/2014   CLINICAL DATA:  Abnormal liver function tests  EXAM: ULTRASOUND ABDOMEN COMPLETE  COMPARISON:  None.  FINDINGS: Gallbladder: No gallstones or wall thickening visualized. No sonographic Murphy sign noted.  Common bile duct: Diameter: 4 mm  Liver: Minimally nodular contour with no focal abnormalities.  IVC: No abnormality visualized.  Pancreas: Visualized portion unremarkable.  Spleen: Size and appearance within normal limits.  Right Kidney: Length: 10.7 cm. Midpole cyst measuring 14 mm. Mildly increased echogenicity.  Left Kidney: Length: 10.8 cm. Lower pole cyst measures 1 cm. Mildly increased echogenicity.   Abdominal aorta: No aneurysm identified. Limited visualization distally due to bowel gas.  Other findings: Right pleural effusion.  Small volume ascites.  IMPRESSION: Mildly  nodular liver contour with small volume of ascites and right pleural effusion. Possibility of cirrhosis not excluded.  Evidence of medical renal disease.   Electronically Signed   By: Skipper Cliche M.D.   On: 11/06/2014 21:38   Ir US Guide Vasc Access Right  11/26/2014   CLINICAL DATA:  Atrial fibrillation  EXAM: RIGHT JUGULAR TUNNELED PICC LINE PLACEMENT WITH ULTRASOUND AND FLUOROSCOPIC GUIDANCE  FLUOROSCOPY TIME:  18 seconds in  PROCEDURE: The patient was advised of the possible risks andcomplications and agreed to undergo the procedure. The patient was then brought to the angiographic suite for the procedure.  The right neck was prepped with chlorhexidine, drapedin the usual sterile fashion using maximum barrier technique (cap and mask, sterile gown, sterile gloves, large sterile sheet, hand hygiene and cutaneous antisepsis) and infiltrated locally with 1% Lidocaine.  Ultrasound demonstrated patency of the right internal jugular vein, and this was documented with an image. Under real-time ultrasound guidance, this vein was accessed with a 21 gauge micropuncture needle and image documentation was performed. A 0.018 wire was introduced in to the vein. Over this, a 5 [place] double lumen Power tunneled PICC was advanced to the lower SVC/right atrial junction. The cuff was positioned in the subcutaneous tract. Fluoroscopy during the procedure and fluoro spot radiograph confirms appropriate catheter position. The catheter was flushed and covered with asterile dressing.  Catheter length: 21 cm  COMPLICATIONS: None  IMPRESSION: Successful right jugular tunneled power PICC line placement with ultrasound and fluoroscopic guidance. The catheter is ready for use.   Electronically Signed   By: Marybelle Killings M.D.   On: 11/26/2014 16:15   Ir Fluoro  Guide Cv Midline Picc Right  11/26/2014   CLINICAL DATA:  Atrial fibrillation  EXAM: RIGHT JUGULAR TUNNELED PICC LINE PLACEMENT WITH ULTRASOUND AND FLUOROSCOPIC GUIDANCE  FLUOROSCOPY TIME:  18 seconds in  PROCEDURE: The patient was advised of the possible risks andcomplications and agreed to undergo the procedure. The patient was then brought to the angiographic suite for the procedure.  The right neck was prepped with chlorhexidine, drapedin the usual sterile fashion using maximum barrier technique (cap and mask, sterile gown, sterile gloves, large sterile sheet, hand hygiene and cutaneous antisepsis) and infiltrated locally with 1% Lidocaine.  Ultrasound demonstrated patency of the right internal jugular vein, and this was documented with an image. Under real-time ultrasound guidance, this vein was accessed with a 21 gauge micropuncture needle and image documentation was performed. A 0.018 wire was introduced in to the vein. Over this, a 5 [place] double lumen Power tunneled PICC was advanced to the lower SVC/right atrial junction. The cuff was positioned in the subcutaneous tract. Fluoroscopy during the procedure and fluoro spot radiograph confirms appropriate catheter position. The catheter was flushed and covered with asterile dressing.  Catheter length: 21 cm  COMPLICATIONS: None  IMPRESSION: Successful right jugular tunneled power PICC line placement with ultrasound and fluoroscopic guidance. The catheter is ready for use.   Electronically Signed   By: Marybelle Killings M.D.   On: 11/26/2014 16:15    Thurnell Lose, MD  [facility] Pager:[phone]  If 7PM-7AM, please contact night-coverage [email or web address] Password [email or web address] 11/29/2014, 10:50 AM   LOS: 11 days

## 2014-11-29 NOTE — Progress Notes (Signed)
ANTICOAGULATION CONSULT NOTE - Follow Up Consult  Pharmacy Consult for Heparin Indication: Afib  Allergies  Allergen Reactions  . Dilaudid [Hydromorphone] Nausea And Vomiting    Patient Measurements: Height: 6' (182.9 cm) Weight: 177 lb 7.5 oz (80.5 kg) IBW/kg (Calculated) : 77.6  Vital Signs: Temp: 98.5 F (36.9 C) (08/11 1352) Temp Source: Oral (08/11 1352) BP: 121/77 mmHg (08/11 1352) Pulse Rate: 94 (08/11 1352)  Labs:  Recent Labs  11/26/14 1916  11/27/14 0723 11/28/14 0612 11/28/14 1641 11/28/14 2112 11/29/14 0558 11/29/14 1635  HGB  --   < > 10.5* 9.9*  --  10.9* 10.5*  --   HCT  --   < > 31.7* 28.9*  --  31.8* 31.2*  --   PLT  --   < > 114* 111*  --  117* 115*  --   LABPROT  --   --  26.1* 30.3*  --   --  22.0*  --   INR  --   --  2.43* 2.96*  --   --  1.93*  --   HEPARINUNFRC 0.20*  --  0.47  --   --   --   --  0.26*  CREATININE  --   --   --   --  5.02*  --   --   --   < > = values in this interval not displayed.  Estimated Creatinine Clearance: 17.4 mL/min (by C-G formula based on Cr of 5.02).   Assessment: 59 year old male on heparin to bridge warfarin for hx Afib. Heparin level is just below goal at 0.26. No bleeding noted. RN reports no problems with infusion.  Goal of Therapy:  Heparin level 0.3-0.7 units/ml Monitor platelets by anticoagulation protocol: Yes   Plan:  Increase heparin drip to 1350 units/hr Daily HL/CBC/INR Monitor s/sx of bleeding  [facility], [place].D., BCPS Clinical Pharmacist Pager: [phone] 11/29/2014 6:01 PM

## 2014-11-29 NOTE — Care Management Important Message (Signed)
Important Message  Patient Details  Name: Joseph [name] MRN: [id number] Date of Birth: [birth year]   Medicare Important Message Given:  Yes-fourth notification given    [name], RN 11/29/2014, 11:46 AMImportant Message  Patient Details  Name: Joseph [name] MRN: [id number] Date of Birth: 05-Jul-1955   Medicare Important Message Given:  Yes-fourth notification given    [name], RN 11/29/2014, 11:46 AM

## 2014-11-29 NOTE — Progress Notes (Signed)
[facility] Progress Note   Subjective: on bedside commode, mentation continues to improve  Filed Vitals:   11/28/14 1945 11/28/14 2057 11/29/14 0127 11/29/14 0504  BP: 159/84 136/64 122/67 134/74  Pulse: 94 78 81 93  Temp: 97.6 F (36.4 C) 98.1 F (36.7 C) 98.7 F (37.1 C) 98.5 F (36.9 C)  TempSrc: Oral Oral Oral Oral  Resp: 12 20 20 20   Height:      Weight: 80.5 kg (177 lb 7.5 oz)     SpO2: 98% 98% 96% 99%   Exam: General: Ox 2.5 today  Heart: RRR  Lungs: CTA, unlabored  Abdomen:soft, nontender, pustular lesions on back are drying up Extremities: no edema Dialysis Access: L AVG +b/t   East MWF 80kg 3/2.5 bath P2 AVG L arm Heparin 6100  Calcitriol 1.75 (no Fe/ esa) Last tsat 42%, ferr 1122, pth 427, Hb 11.6  Assessment/Plan: 1. 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 Serum pcr for HSV was negative, however pcr for CMV was positive. ID following.  2. C. Diff colitis- on abx 3. DKA / DM type 1 - resolved 4. Seizures - on po dilantin 5. ESRD HD MWF at dry wt but needs lower dry wt 6. Enterococcal UTI- s/p 1 dose fosfomycin on 8/2 7. Anemia / CKD - Hb 10.5, no esa yet 8. CKD-MBD: Cont with binders (phos 5 in range now) and calcitriol with HD.8/8 9. Abnormal LFT's-? Etiology. Trending down. No abd pain. [place] 11/06/14- mild nodular liver contour w mall volume of ascitis. Possibility of cirrhosis not excluded.Ammonia level 44 on 8/2 10. Nutrition:per primary 11. Hypertension: stable 12. A fib- on dilt/ MTP and IV hep per Kalman Drape MD pager [phone] cell [phone] 11/29/2014, 11:37 AM     Recent Labs Lab 11/23/14 0532  11/25/14 0514 11/26/14 0951 11/28/14 1641  NA 135  < > 126* 125* 126*  K 4.1  < > 4.5 4.3 4.7  CL 96*  < > 91* 90* 93*  CO2 24  < > 26 22 21*  GLUCOSE 52*  < > 498* 146* 322*  BUN 57*  < > 56* 82* 89*  CREATININE  4.68*  < > 4.40* 5.00* 5.02*  CALCIUM 7.9*  < > 7.7* 8.0* 8.0*  PHOS 4.5  --   --  5.0* 5.8*  < > = values in this interval not displayed.  Recent Labs Lab 11/23/14 0532 11/24/14 0352 11/25/14 0514 11/26/14 0951 11/28/14 1641  AST 127* 107* 86*  --   --   ALT 169* 136* 119*  --   --   ALKPHOS 209* 208* 191*  --   --   BILITOT 1.5* 1.5* 1.2  --   --   PROT 6.3* 5.7* 5.5*  --   --   ALBUMIN 3.2*  3.2* 2.9* 2.8* 2.7* 2.6*    Recent Labs Lab 11/28/14 0612 11/28/14 2112 11/29/14 0558  WBC 11.5* 14.0* 13.2*  HGB 9.9* 10.9* 10.5*  HCT 28.9* 31.8* 31.2*  MCV 92.6 91.6 94.3  PLT 111* 117* 115*   . antiseptic oral rinse  7 mL Mouth Rinse 6 times per day  . calcitRIOL  1.75 mcg Oral Q M,W,F-HD  . clobetasol cream  1 application Topical TID  . colchicine  0.3 mg Oral Once per day on Mon Thu  . diltiazem  30 mg Oral 4 times per day  . insulin aspart  0-15  Units Subcutaneous TID WC  . insulin aspart  0-5 Units Subcutaneous QHS  . insulin glargine  25 Units Subcutaneous BID  . LORazepam  1 mg Intravenous Once  . metoprolol  50 mg Oral BID  . multivitamin  1 tablet Oral QHS  . pantoprazole  20 mg Oral BID  . phenytoin  300 mg Oral QHS  . saccharomyces boulardii  250 mg Oral BID  . sevelamer carbonate  1,600 mg Oral TID WC  . vancomycin  500 mg Oral 4 times per day   . sodium chloride 10 mL/hr at 11/18/14 2300  . sodium chloride 10 mL/hr at 11/27/14 0541  . heparin 1,200 Units/hr (11/29/14 1139)   camphor-menthol, dextrose, hydrOXYzine, sodium chloride

## 2014-11-29 NOTE — Progress Notes (Signed)
Pt had during the HS SVT he was asymptomatic no chest pain or SOB. Lopressor and Cardizem was given as scheduled pt returned to Afib rate controlled pt has returned from HD. Arthor Captain LPN

## 2014-11-29 NOTE — Progress Notes (Deleted)
Subjective:   More and more oriented  Objective Filed Vitals:   11/28/14 1945 11/28/14 2057 11/29/14 0127 11/29/14 0504  BP: 159/84 136/64 122/67 134/74  Pulse: 94 78 81 93  Temp: 97.6 F (36.4 C) 98.1 F (36.7 C) 98.7 F (37.1 C) 98.5 F (36.9 C)  TempSrc: Oral Oral Oral Oral  Resp: 12 20 20 20   Height:      Weight: 80.5 kg (177 lb 7.5 oz)     SpO2: 98% 98% 96% 99%   Physical Exam General: alert- oriented to person only Heart: RRR  Lungs: CTA, unlabored  Abdomen:soft, nontender +BS/ back with rash and papular lesions Extremities: no edema Dialysis Access: L AVG +b/t  East MWF 80kg 3/2.5 bath P2 AVG L arm Heparin 6100  Calcitriol 1.75 (no Fe/ esa) Last tsat 42%, ferr 1122, pth 427, Hb 11.6  Assessment/Plan: 1. 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 Serum pcr for HSV was negative, however pcr for CMV was positive. ID following.  2. C. Diff colitis- on abx 3. DKA / DM type 1 - resolved 4. Seizures - on po dilantin 5. ESRD HD MWF  6. Enterococcal UTI- s/p 1 dose fosfomycin on 8/2 7. Anemia / CKD - Hb 10.5, no esa yet 8. CKD-MBD: Cont with binders (phos 5 in range now) and calcitriol with HD.8/8 9. Abnormal LFT's-? Etiology. Trending down. No abd pain. [place] 11/06/14- mild nodular liver contour w mall volume of ascitis. Possibility of cirrhosis not excluded.Ammonia level 44 on 8/2 10. Nutrition:per primary 11. Hypertension: stable 12. A fib- on dilt/ MTP and IV hep per Kalman Drape MD pager [phone]    cell [phone] 11/29/2014, 11:37 AM    Additional Objective Labs: Basic Metabolic Panel:  Recent Labs Lab 11/23/14 0532  11/25/14 0514 11/26/14 0951 11/28/14 1641  NA 135  < > 126* 125* 126*  K 4.1  < > 4.5 4.3 4.7  CL 96*  < > 91* 90* 93*  CO2 24  < > 26 22 21*  GLUCOSE 52*  < > 498* 146* 322*  BUN 57*  < > 56* 82* 89*  CREATININE 4.68*  < > 4.40*  5.00* 5.02*  CALCIUM 7.9*  < > 7.7* 8.0* 8.0*  PHOS 4.5  --   --  5.0* 5.8*  < > = values in this interval not displayed. Liver Function Tests:  Recent Labs Lab 11/23/14 0532 11/24/14 0352 11/25/14 0514 11/26/14 0951 11/28/14 1641  AST 127* 107* 86*  --   --   ALT 169* 136* 119*  --   --   ALKPHOS 209* 208* 191*  --   --   BILITOT 1.5* 1.5* 1.2  --   --   PROT 6.3* 5.7* 5.5*  --   --   ALBUMIN 3.2*  3.2* 2.9* 2.8* 2.7* 2.6*   No results for input(s): LIPASE, AMYLASE in the last 168 hours. CBC:  Recent Labs Lab 11/26/14 0240 11/27/14 0723 11/28/14 0612 11/28/14 2112 11/29/14 0558  WBC 13.9* 10.3 11.5* 14.0* 13.2*  HGB 10.4* 10.5* 9.9* 10.9* 10.5*  HCT 30.2* 31.7* 28.9* 31.8* 31.2*  MCV 91.5 93.0 92.6 91.6 94.3  PLT 113* 114* 111* 117* 115*   Blood Culture    Component Value Date/Time   SDES CSF 11/23/2014 1630   SPECREQUEST NONE 11/23/2014 1630   CULT NO GROWTH 3 DAYS 11/23/2014 1630   REPTSTATUS 11/27/2014 FINAL 11/23/2014 1630  Cardiac Enzymes:  Recent Labs Lab 11/23/14 1615 11/23/14 2112 11/24/14 0352  TROPONINI 0.52* 0.53* 0.51*   CBG:  Recent Labs Lab 11/28/14 1207 11/28/14 2152 11/29/14 0136 11/29/14 0503 11/29/14 0739  GLUCAP 244* 251* 358* 255* 323*   Iron Studies: No results for input(s): IRON, TIBC, TRANSFERRIN, FERRITIN in the last 72 hours. @lablastinr3 @ Studies/Results: Medications: . sodium chloride 10 mL/hr at 11/18/14 2300  . sodium chloride 10 mL/hr at 11/27/14 0541  . heparin     . antiseptic oral rinse  7 mL Mouth Rinse 6 times per day  . calcitRIOL  1.75 mcg Oral Q M,W,F-HD  . clobetasol cream  1 application Topical TID  . colchicine  0.3 mg Oral Once per day on Mon Thu  . diltiazem  30 mg Oral 4 times per day  . feeding supplement (NEPRO CARB STEADY)  237 mL Oral BID BM  . insulin aspart  0-15 Units Subcutaneous TID WC  . insulin aspart  0-5 Units Subcutaneous QHS  . insulin glargine  25 Units Subcutaneous BID  .  LORazepam  1 mg Intravenous Once  . metoprolol  50 mg Oral BID  . multivitamin  1 tablet Oral QHS  . pantoprazole  20 mg Oral BID  . phenytoin  300 mg Oral QHS  . saccharomyces boulardii  250 mg Oral BID  . sevelamer carbonate  1,600 mg Oral TID WC  . vancomycin  500 mg Oral 4 times per day

## 2014-11-30 ENCOUNTER — Inpatient Hospital Stay (HOSPITAL_COMMUNITY): Payer: Medicare Other

## 2014-11-30 LAB — HEPATIC FUNCTION PANEL
ALT: 69 U/L — AB (ref 17–63)
AST: 34 U/L (ref 15–41)
Albumin: 2.4 g/dL — ABNORMAL LOW (ref 3.5–5.0)
Alkaline Phosphatase: 147 U/L — ABNORMAL HIGH (ref 38–126)
Bilirubin, Direct: 0.5 mg/dL (ref 0.1–0.5)
Indirect Bilirubin: 0.7 mg/dL (ref 0.3–0.9)
Total Bilirubin: 1.2 mg/dL (ref 0.3–1.2)
Total Protein: 4.8 g/dL — ABNORMAL LOW (ref 6.5–8.1)

## 2014-11-30 LAB — CBC
HCT: 34.4 % — ABNORMAL LOW (ref 39.0–52.0)
Hemoglobin: 11.3 g/dL — ABNORMAL LOW (ref 13.0–17.0)
MCH: 30.7 pg (ref 26.0–34.0)
MCHC: 32.8 g/dL (ref 30.0–36.0)
MCV: 93.5 fL (ref 78.0–100.0)
PLATELETS: 153 10*3/uL (ref 150–400)
RBC: 3.68 MIL/uL — ABNORMAL LOW (ref 4.22–5.81)
RDW: 18 % — AB (ref 11.5–15.5)
WBC: 18 10*3/uL — ABNORMAL HIGH (ref 4.0–10.5)

## 2014-11-30 LAB — PROTIME-INR
INR: 1.49 (ref 0.00–1.49)
Prothrombin Time: 18.1 seconds — ABNORMAL HIGH (ref 11.6–15.2)

## 2014-11-30 LAB — GLUCOSE, CAPILLARY
GLUCOSE-CAPILLARY: 145 mg/dL — AB (ref 65–99)
Glucose-Capillary: 93 mg/dL (ref 65–99)
Glucose-Capillary: 95 mg/dL (ref 65–99)
Glucose-Capillary: 94 mg/dL (ref 65–99)

## 2014-11-30 LAB — AMMONIA: Ammonia: 27 umol/L (ref 9–35)

## 2014-11-30 LAB — HEPARIN LEVEL (UNFRACTIONATED): Heparin Unfractionated: 0.5 IU/mL (ref 0.30–0.70)

## 2014-11-30 MED ORDER — METOPROLOL TARTRATE 25 MG PO TABS
25.0000 mg | ORAL_TABLET | Freq: Two times a day (BID) | ORAL | Status: DC
  Administered 2014-11-30 – 2014-12-02 (×4): 25 mg via ORAL
  Filled 2014-11-30 (×4): qty 1

## 2014-11-30 MED ORDER — HYDROCERIN EX CREA
TOPICAL_CREAM | Freq: Four times a day (QID) | CUTANEOUS | Status: DC
  Administered 2014-11-30 – 2014-12-02 (×11): via TOPICAL
  Filled 2014-11-30 (×2): qty 113

## 2014-11-30 MED ORDER — WARFARIN SODIUM 5 MG PO TABS
5.0000 mg | ORAL_TABLET | Freq: Once | ORAL | Status: AC
  Administered 2014-11-30: 5 mg via ORAL
  Filled 2014-11-30: qty 1

## 2014-11-30 MED ORDER — WARFARIN - PHARMACIST DOSING INPATIENT: Freq: Every day | Status: DC

## 2014-11-30 MED ORDER — FLUOCINONIDE 0.05 % EX OINT
TOPICAL_OINTMENT | Freq: Four times a day (QID) | CUTANEOUS | Status: DC
  Administered 2014-11-30 (×3): via TOPICAL
  Filled 2014-11-30: qty 15

## 2014-11-30 NOTE — Progress Notes (Signed)
Physical Therapy Treatment Patient Details Name: Joseph [name] MRN: [id number] DOB: 1955/10/03 Today's Date: 11/30/2014    History of Present Illness Pt adm metabolic encephalopathy. Due to underlying ESRD, sepsis from C. difficile colitis, mild DKA with ? UTI. AMS not resolving and EEG showed seizures and MRI showed ?infectious process. PMH - ESRD on HD, DM, C-diff    PT Comments    Pt continues to be limited by high HR during short distance mobility.  Max observed during PT session 145 bpm.  Pt generally weak, deconditioned and continues to be confused.  Pt would benefit from SNF placement at discharge for rehab.   Follow Up Recommendations  SNF     Equipment Recommendations  None recommended by PT    Recommendations for Other Services   NA     Precautions / Restrictions Precautions Precautions: Fall Precaution Comments: weak on his feet    Mobility  Bed Mobility Overal bed mobility: Needs Assistance Bed Mobility: Sit to Supine       Sit to supine: Min guard   General bed mobility comments: To help boost legs last 1-2 " over EOB.   Transfers Overall transfer level: Needs assistance Equipment used: Rolling walker (2 wheeled) Transfers: Sit to/from Stand Sit to Stand: Min assist         General transfer comment: Min assist using momentum from low recliner chair, mod assist from even lower toilet.  Pt is over 6' tall and both surfaces are low requiring assist at trunk to power up over weak legs.  Pt with heavy reliance on upper extremity support for transitions.   Ambulation/Gait Ambulation/Gait assistance: Min assist Ambulation Distance (Feet): 45 Feet Assistive device: Rolling walker (2 wheeled) Gait Pattern/deviations: Step-through pattern;Shuffle;Trunk flexed Gait velocity: decreased Gait velocity interpretation: Below normal speed for age/gender General Gait Details: verbal cues to stay closer to RW for safety.        Balance Overall balance assessment:  Needs assistance         Standing balance support: Bilateral upper extremity supported Standing balance-Leahy Scale: Zero                      Cognition Arousal/Alertness: Awake/alert Behavior During Therapy: WFL for tasks assessed/performed Overall Cognitive Status: Impaired/Different from baseline Area of Impairment: Memory;Safety/judgement;Orientation Orientation Level: Time;Situation;Disoriented to Current Attention Level: Selective Memory: Decreased recall of precautions;Decreased short-term memory   Safety/Judgement: Decreased awareness of safety;Decreased awareness of deficits   Problem Solving: Requires verbal cues;Requires tactile cues;Difficulty sequencing General Comments: Pt's mentation is better than previously described sessions, however, he is still impaired.  "August?!? It is August?" Pt thought it was May and could not relay the year at all.     Exercises General Exercises - Lower Extremity Long Arc Quad: AROM;Both;10 reps;Seated Toe Raises: AROM;Both;20 reps;Seated        Pertinent Vitals/Pain Pain Assessment: No/denies pain           PT Goals (current goals can now be found in the care plan section) Acute Rehab PT Goals Patient Stated Goal: Return home PT Goal Formulation: With patient Time For Goal Achievement: 12/14/14 Potential to Achieve Goals: Good Progress towards PT goals: Progressing toward goals    Frequency  Min 2X/week    PT Plan Current plan remains appropriate;Frequency needs to be updated       End of Session Equipment Utilized During Treatment: Gait belt Activity Tolerance: Patient limited by fatigue;Treatment limited secondary to medical complications (Comment) (limited by high  HR) Patient left: in bed;with call bell/phone within reach;with bed alarm set     Time: 1400-1425 PT Time Calculation (min) (ACUTE ONLY): 25 min  Charges:  $Gait Training: 8-22 mins $Therapeutic Activity: 8-22 mins                      Lorretta Kerce B. [place], [place], DPT [phone]   11/30/2014, 2:57 PM

## 2014-11-30 NOTE — Progress Notes (Signed)
SLP Cancellation Note  Patient Details Name: MISTY RAGO MRN: [id number] DOB: [birth year]   Cancelled treatment:       Reason Eval/Treat Not Completed: Patient at procedure or test/unavailable (pt at dialysis when SLP attempted BSE)   ADAMS,PAT, M.S., CCC-SLP 11/30/2014, 4:00 PM

## 2014-11-30 NOTE — Progress Notes (Addendum)
[facility] Progress Note   Subjective: back is itching again. Wife notes legs and arms are swollen  Filed Vitals:   11/29/14 2302 11/29/14 2343 11/30/14 0505 11/30/14 0537  BP:  132/71 99/61 99/66   Pulse:  88 96   Temp: 99.6 F (37.6 C)  99.5 F (37.5 C)   TempSrc: Oral  Oral   Resp:   14   Height:      Weight:      SpO2:   100%    Exam: General: Ox 2.5 today up in chair  Heart: RRR  Lungs: CTA, unlabored  Abd soft ntnd +bs no ascites Extremities: diffuse 1-2 + edema of arms and legs Dialysis Access: L AVG +b/t   East MWF 80kg 3/2.5 bath P2 AVG L arm Heparin 6100  Calcitriol 1.75 (no Fe/ esa) Last tsat 42%, ferr 1122, pth 427, Hb 11.6  Assessment: 1. AMS - some better. 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 Have d/w neurology, pt may not get back to baseline, suspect underlying dementia /liver failure/ etoh history along w renal failure all probably contributing.  2. Volume excess/ hyponatremia - at dry wt but now edematous c/w lean body wt loss, will need extra volume removed w HD   3. C. Diff colitis- on abx 4. DKA / DM type 1 - resolved 5. Seizures - on po dilantin 6. ESRD HD MWF at dry wt but needs lower dry wt 7. Enterococcal UTI- s/p 1 dose fosfomycin on 8/2 8. 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 Abnormal LFT's-? Etiology. Trending down. No abd pain. [place] 11/06/14- mild nodular liver contour w mall volume of ascitis. Possibility of cirrhosis not excluded.Ammonia level 44 on 8/2 11. Nutrition:per primary 12. A fib- on dilt/ MTP and IV hep per pharm  Plan - HD today and tomorrow to get volume down  Kelly Splinter MD pager [phone] cell [phone] 11/29/2014, 11:37 AM     Recent Labs Lab 11/25/14 0514 11/26/14 0951 11/28/14 1641  NA 126* 125* 126*  K 4.5 4.3 4.7  CL 91* 90* 93*  CO2 26 22 21*  GLUCOSE 498* 146* 322*  BUN  56* 82* 89*  CREATININE 4.40* 5.00* 5.02*  CALCIUM 7.7* 8.0* 8.0*  PHOS  --  5.0* 5.8*    Recent Labs Lab 11/24/14 0352 11/25/14 0514 11/26/14 0951 11/28/14 1641  AST 107* 86*  --   --   ALT 136* 119*  --   --   ALKPHOS 208* 191*  --   --   BILITOT 1.5* 1.2  --   --   PROT 5.7* 5.5*  --   --   ALBUMIN 2.9* 2.8* 2.7* 2.6*    Recent Labs Lab 11/28/14 2112 11/29/14 0558 11/30/14 0420  WBC 14.0* 13.2* 18.0*  HGB 10.9* 10.5* 11.3*  HCT 31.8* 31.2* 34.4*  MCV 91.6 94.3 93.5  PLT 117* 115* 153   . antiseptic oral rinse  7 mL Mouth Rinse 6 times per day  . calcitRIOL  1.75 mcg Oral Q M,W,F-HD  . clobetasol cream  1 application Topical TID  . colchicine  0.3 mg Oral Once per day on Mon Thu  . diltiazem  30 mg Oral 4 times per day  . fluocinonide ointment   Topical QID  . hydrocerin   Topical QID  . insulin aspart  0-15 Units Subcutaneous TID WC  .  insulin aspart  0-5 Units Subcutaneous QHS  . insulin glargine  25 Units Subcutaneous BID  . LORazepam  1 mg Intravenous Once  . metoprolol  50 mg Oral BID  . multivitamin  1 tablet Oral QHS  . pantoprazole  20 mg Oral BID  . phenytoin  300 mg Oral QHS  . saccharomyces boulardii  250 mg Oral BID  . sevelamer carbonate  1,600 mg Oral TID WC  . vancomycin  500 mg Oral 4 times per day   . sodium chloride 10 mL/hr at 11/18/14 2300  . sodium chloride 10 mL/hr at 11/27/14 0541  . heparin 1,350 Units/hr (11/30/14 0421)   camphor-menthol, dextrose, hydrOXYzine, sodium chloride

## 2014-11-30 NOTE — Clinical Social Work Placement (Signed)
   CLINICAL SOCIAL WORK PLACEMENT  NOTE  Date:  11/30/2014  Patient Details  Name: Joseph [name] MRN: [id number] Date of Birth: 22-May-1955  Clinical Social Work is seeking post-discharge placement for this patient at the [place] level of care (*CSW will initial, date and re-position this form in  chart as items are completed):  Yes   Patient/family provided with [place] Work Department's list of facilities offering this level of care within the geographic area requested by the patient (or if unable, by the patient's family).  Yes   Patient/family informed of their freedom to choose among providers that offer the needed level of care, that participate in Medicare, Medicaid or managed care program needed by the patient, have an available bed and are willing to accept the patient.  Yes   Patient/family informed of [place]'s ownership interest in [facility] and [facility], as well as of the fact that they are under no obligation to receive care at these facilities.  PASRR submitted to EDS on       PASRR number received on       Existing PASRR number confirmed on 11/29/14     FL2 transmitted to all facilities in geographic area requested by pt/family on 11/29/14     FL2 transmitted to all facilities within larger geographic area on       Patient informed that his/her managed care company has contracts with or will negotiate with certain facilities, including the following:        Yes   Patient/family informed of bed offers received.  Patient chooses bed at [facility]     Physician recommends and patient chooses bed at      Patient to be transferred to [facility] on  .  Patient to be transferred to facility by Ambulance     Patient family notified on   of transfer.  Name of family member notified:        PHYSICIAN       Additional Comment:  Patient has bed at [facility] for weekend DC. MD please  write DC order once patient is medically stable for DC. Report left for weekend CSW.  _______________________________________________ Liz Beach MSW, [place], [place], [phone]

## 2014-11-30 NOTE — Progress Notes (Signed)
ANTICOAGULATION CONSULT NOTE - Follow Up Consult  Pharmacy Consult for Heparin and Warfarin Indication: Afib  Allergies  Allergen Reactions  . Dilaudid [Hydromorphone] Nausea And Vomiting    Patient Measurements: Height: 6' (182.9 cm) Weight: 177 lb 7.5 oz (80.5 kg) IBW/kg (Calculated) : 77.6  Vital Signs: Temp: 98.4 F (36.9 C) (08/12 1258) Temp Source: Oral (08/12 1258) BP: 102/66 mmHg (08/12 1258) Pulse Rate: 91 (08/12 1258)  Labs:  Recent Labs  11/28/14 0612 11/28/14 1641 11/28/14 2112 11/29/14 0558 11/29/14 1635 11/30/14 0420 11/30/14 1300  HGB 9.9*  --  10.9* 10.5*  --  11.3*  --   HCT 28.9*  --  31.8* 31.2*  --  34.4*  --   PLT 111*  --  117* 115*  --  153  --   LABPROT 30.3*  --   --  22.0*  --  18.1*  --   INR 2.96*  --   --  1.93*  --  1.49  --   HEPARINUNFRC  --   --   --   --  0.26*  --  0.50  CREATININE  --  5.02*  --   --   --   --   --     Estimated Creatinine Clearance: 17.4 mL/min (by C-G formula based on Cr of 5.02).   Assessment: 59 year old male on heparin to bridge warfarin for hx Afib. Heparin level is just below goal at 0.26. No bleeding noted. RN reports no problems with infusion.  F/u HL is therapeutic at 0.50 on heparin 1350 units/hr and INR is subtherapeutic at 1.5. No issues with infusion or bleeding noted. Will resume warfarin tonight.   Goal of Therapy:  INR 2-3 Heparin level 0.3-0.7 units/ml Monitor platelets by anticoagulation protocol: Yes   Plan:  Continue heparin 1350 units/hr Warfarin 5mg  tonight x1 Daily HL/CBC/INR Monitor s/sx of bleeding  [facility], Pharm.D., BCPS Clinical Pharmacist Pager: [phone] 11/30/2014 1:31 PM

## 2014-11-30 NOTE — Progress Notes (Signed)
PATIENT DETAILS Name: Joseph [name] Age: 59 y.o. Sex: male Date of Birth: 1955-08-29 Admit Date: 11/18/2014 Admitting Physician Theodis Blaze, MD JJO:ACZYS, REGINA, NP  Brief narrative:  59 year old male with history of end-stage renal disease on hemodialysis, recent hospitalization and discharge on 7/22 for C. difficile colitis, presented on 7/31 with altered mental status, diabetic ketoacidosis, and significant leukocytosis. Initially, altered mental status was felt to be from toxic metabolic encephalopathy setting of C. difficile with sepsis and DKA. 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 Neurology was subsequently consulted, underwent lumbar puncture on 8/5-CSF did not show any major abnormalities, neurology now has started the patient on high-dose steroids. See below for further details  Subjective:  Patient in bed, denies headache, no chest abdominal pain. Mildly confused. No focal weakness.  Assessment/Plan:   Acute encephalopathy: Initially suspected to be from toxic metabolic encephalopathy from mild DKA, C. difficile colitis. Since continued to have confusion inspite of improvement in blood sugars and C. 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 Repeat MRI 11-26-14 mild improvement, pending repeat EEG.  Seen by neurology underwent LP which was unimpressive. Initially given a trial of acyclovir which was stopped by neurology.   Currently on Dilantin for his seizures he has finished his 5 day course of 1 g IV Solu-Medrol. 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 ID has been consult it. ID recommends stopping IV steroids which patient has already finished the course of. Repeat LP requested by ID if  patient gets worse. I have informed neurology of the same on 11-29-14 discussed with Dr. Nicole Kindred again personally on 11/30/2014. No change in treatment per Dr. Nicole Kindred.  Pending CSF serology - Lyme studies pending. CSF NMDA Ab and paraneoplastic panel pending as well.      C. difficile colitis with sepsis: diarrhea improved, treated with high-dose oral vancomycin- started 7/31  will finish his course on 11/30/2014.   New right lower lobe infiltrate on 11/30/2014. No cough or shortness of breath. 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    Atrial fibrillation with RVR: heart rate much better after adjustment of meds-continue cardizem and metoprolol.  Chads2vasc score of 2, continue Coumadin and heparin overlap pharmacy monitoring.    Lab Results  Component Value Date   INR 1.49 11/30/2014   INR 1.93* 11/29/2014   INR 2.96* 11/28/2014     Mildly elevated Troponin:secondary to demand ischemia/RVR/CKD. No further recommendations from cards. On beta blocker continue. This was not ACS.   Elevated LFT's: ?etiology-suspect shock liver from sepsis.USG abd and recently questions mild cirrhosis, Hepatitis serology neg. LFT's downtrending, ferritin level was elevated will have outpatient GI follow-up.   End-stage renal disease: On hemodialysis-Monday, Wednesday and Friday. Nephrology following   Gout: Continue with colcichine   Chronic skin rash: I Discussed his case with his dermatologist Dr. Allyson Sabal who describes this as nonspecific dermatitis. Supportive care only.   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 Question paraneoplastic presentation of occult malignancy, will check CT chest abdomen pelvis on 11/27/2014. His custody  again with dermatologist's PA, extended her pictures of his rash which  are consistent with nonspecific dermatitis with excoriation. New steroids cream started per the recommendation. Wife updated.   DKA in DM2 : poor outpatient control, DKA has resolved, he was kept on glucose stabilizer here while he was taking 1 g IV Solu-Medrol a daily basis. He has finished his steroids course and will be transitioned to Lantus twice a day along with sliding scale on 11/29/2014 with monitor CBGs closely.  CBG (last 3)   Recent Labs  11/29/14 2343 11/30/14 0756 11/30/14 1147  GLUCAP 173* 93 95    Lab Results  Component Value Date   HGBA1C 9.4* 11/07/2014      Disposition:  SNF discharge in the next 1-2 days  Antimicrobial agents   See below  Anti-infectives    Start     Dose/Rate Route Frequency Ordered Stop   11/26/14 1200  vancomycin (VANCOCIN) 50 mg/mL oral solution 500 mg  Status:  Discontinued     500 mg Oral 4 times per day 11/26/14 0938 11/30/14 1320   11/22/14 1015  acyclovir (ZOVIRAX) 800 mg in dextrose 5 % 150 mL IVPB  Status:  Discontinued     800 mg 166 mL/hr over 60 Minutes Intravenous Every 12 hours 11/22/14 1001 11/22/14 1009   11/22/14 1015  acyclovir (ZOVIRAX) 400 mg in dextrose 5 % 100 mL IVPB  Status:  Discontinued     400 mg 108 mL/hr over 60 Minutes Intravenous Every 24 hours 11/22/14 1010 11/25/14 0758   11/22/14 1000  acyclovir (ZOVIRAX) 200 MG capsule 200 mg  Status:  Discontinued     200 mg Oral 3 times daily 11/22/14 0826 11/22/14 1000   11/19/14 1400  vancomycin (VANCOCIN) 50 mg/mL oral solution 500 mg     500 mg Oral 4 times per day 11/19/14 1001 11/25/14 1359   11/18/14 1400  vancomycin (VANCOCIN) 125 MG capsule 250 mg  Status:  Discontinued     250 mg Oral 4 times daily 11/18/14 1128 11/18/14 1248   11/18/14 1400  vancomycin (VANCOCIN) 50 mg/mL oral solution 125 mg  Status:  Discontinued     125 mg Oral 4 times per day 11/18/14 1248 11/18/14 1320   11/18/14 1400  vancomycin (VANCOCIN) 50 mg/mL oral solution 250 mg   Status:  Discontinued     250 mg Oral 4 times per day 11/18/14 1320 11/19/14 1001      DVT Prophylaxis: Coumadin/Heparin gtt  Code Status: Full code   Family Communication Wife and mother-in-law bedside on 11/27/2014 , 11/30/2014  Procedures:  LP. Showing mildly increased protein levels  EEG - recorded two seizures which appear to arise from the left hemisphere as well as signs of irritability with potential for seizure origination in the right temporal region as well.     CONSULTS:  nephrology , Cards, neurology, ID   Time spent 35 minutes-Greater than 50% of this time was spent in counseling, explanation of diagnosis, planning of further management, and coordination of care.  MEDICATIONS: Scheduled Meds: . antiseptic oral rinse  7 mL Mouth Rinse 6 times per day  . calcitRIOL  1.75 mcg Oral Q M,W,F-HD  . clobetasol cream  1 application Topical TID  . colchicine  0.3 mg Oral Once per day on Mon Thu  . diltiazem  30 mg Oral 4 times per day  . fluocinonide ointment   Topical QID  . hydrocerin   Topical QID  . insulin aspart  0-15  Units Subcutaneous TID WC  . insulin aspart  0-5 Units Subcutaneous QHS  . insulin glargine  25 Units Subcutaneous BID  . LORazepam  1 mg Intravenous Once  . metoprolol  25 mg Oral BID  . multivitamin  1 tablet Oral QHS  . pantoprazole  20 mg Oral BID  . phenytoin  300 mg Oral QHS  . saccharomyces boulardii  250 mg Oral BID  . sevelamer carbonate  1,600 mg Oral TID WC   Continuous Infusions: . sodium chloride 10 mL/hr at 11/18/14 2300  . sodium chloride 10 mL/hr at 11/27/14 0541  . heparin 1,350 Units/hr (11/30/14 0421)   PRN Meds:.camphor-menthol, dextrose, hydrOXYzine, sodium chloride    PHYSICAL EXAM: Vital signs in last 24 hours: Filed Vitals:   11/29/14 2343 11/30/14 0505 11/30/14 0537 11/30/14 1258  BP: 132/71 99/61 99/66  102/66  Pulse: 88 96  91  Temp:  99.5 F (37.5 C)  98.4 F (36.9 C)  TempSrc:  Oral  Oral  Resp:   14  16  Height:      Weight:      SpO2:  100%  100%    Weight change:  Filed Weights   11/26/14 0546 11/26/14 0920 11/28/14 1945  Weight: 74 kg (163 lb 2.3 oz) 86 kg (189 lb 9.5 oz) 80.5 kg (177 lb 7.5 oz)   Body mass index is 24.06 kg/(m^2).   Gen Exam: Awake/alert-answers few questions appropriately today Neck: Supple, No JVD.   Chest: B/L Clear.   CVS: S1 S2 Regular, no murmurs.  Abdomen: soft, BS +, non tender, non distended.  Extremities: no edema, lower extremities warm to touch. Neurologic: Non Focal.  Skin:papular dry rash all over mostly on the back Wounds: N/A.    Intake/Output from previous day:  Intake/Output Summary (Last 24 hours) at 11/30/14 1321 Last data filed at 11/30/14 1043  Gross per 24 hour  Intake    422 ml  Output      0 ml  Net    422 ml     LAB RESULTS: CBC  Recent Labs Lab 11/27/14 0723 11/28/14 0612 11/28/14 2112 11/29/14 0558 11/30/14 0420  WBC 10.3 11.5* 14.0* 13.2* 18.0*  HGB 10.5* 9.9* 10.9* 10.5* 11.3*  HCT 31.7* 28.9* 31.8* 31.2* 34.4*  PLT 114* 111* 117* 115* 153  MCV 93.0 92.6 91.6 94.3 93.5  MCH 30.8 31.7 31.4 31.7 30.7  MCHC 33.1 34.3 34.3 33.7 32.8  RDW 17.8* 17.4* 17.5* 17.6* 18.0*    Chemistries   Recent Labs Lab 11/24/14 0352 11/25/14 0514 11/26/14 0951 11/28/14 1641  NA 137 126* 125* 126*  K 4.0 4.5 4.3 4.7  CL 98* 91* 90* 93*  CO2 29 26 22  21*  GLUCOSE 50* 498* 146* 322*  BUN 29* 56* 82* 89*  CREATININE 3.20* 4.40* 5.00* 5.02*  CALCIUM 7.7* 7.7* 8.0* 8.0*    CBG:  Recent Labs Lab 11/29/14 1657 11/29/14 2109 11/29/14 2343 11/30/14 0756 11/30/14 1147  GLUCAP 261* 179* 173* 93 95    GFR Estimated Creatinine Clearance: 17.4 mL/min (by C-G formula based on Cr of 5.02).  Coagulation profile  Recent Labs Lab 11/26/14 0125 11/27/14 0723 11/28/14 0612 11/29/14 0558 11/30/14 0420  INR 1.39 2.43* 2.96* 1.93* 1.49    Cardiac Enzymes  Recent Labs Lab 11/23/14 1615 11/23/14 2112  11/24/14 0352  TROPONINI 0.52* 0.53* 0.51*    Invalid input(s): POCBNP No results for input(s): DDIMER in the last 72 hours. No results for input(s): HGBA1C in the  last 72 hours. No results for input(s): CHOL, HDL, LDLCALC, TRIG, CHOLHDL, LDLDIRECT in the last 72 hours. No results for input(s): TSH, T4TOTAL, T3FREE, THYROIDAB in the last 72 hours.  Invalid input(s): FREET3 No results for input(s): VITAMINB12, FOLATE, FERRITIN, TIBC, IRON, RETICCTPCT in the last 72 hours. No results for input(s): LIPASE, AMYLASE in the last 72 hours.  Urine Studies No results for input(s): UHGB, CRYS in the last 72 hours.  Invalid input(s): UACOL, UAPR, USPG, UPH, UTP, UGL, UKET, UBIL, UNIT, UROB, ULEU, UEPI, UWBC, URBC, UBAC, CAST, UCOM, BILUA  MICROBIOLOGY: Recent Results (from the past 240 hour(s))  CSF culture with Stat gram stain     Status: None   Collection Time: 11/23/14  4:30 PM  Result Value Ref Range Status   Specimen Description CSF  Final   Special Requests NONE  Final   Gram Stain   Final    WBC PRESENT, PREDOMINANTLY MONONUCLEAR NO ORGANISMS SEEN CYTOSPIN    Culture NO GROWTH 3 DAYS  Final   Report Status 11/27/2014 FINAL  Final    RADIOLOGY STUDIES/RESULTS: Ct Abdomen Pelvis Wo Contrast  11/27/2014   CLINICAL DATA:  59 year old male with C difficile, elevated white count, altered mental status/encephalitis and diabetic ketoacidosis. Elevated LFTs and end-stage renal disease.  EXAM: CT CHEST, ABDOMEN AND PELVIS WITHOUT CONTRAST  TECHNIQUE: Multidetector CT imaging of the chest, abdomen and pelvis was performed following the standard protocol without IV contrast.  COMPARISON:  11/18/2014 and prior chest radiographs. 11/06/2014 abdominal ultrasound.  FINDINGS: CT CHEST FINDINGS  Mediastinum/Nodes: Mild pulmonary and moderate-heavy coronary artery calcifications noted. There is no evidence of thoracic aortic aneurysm or pericardial effusion. Shotty mediastinal lymph nodes are  identified.  Lungs/Pleura: A small right pleural effusion is noted. Mild basilar atelectasis identified. Mild interstitial opacities within bilateral upper lobes noted, some with a tree-in-bud type appearance-question infection. There is no definite airspace disease, consolidation, suspicious nodule, mass or endobronchial/ endotracheal lesion.  Musculoskeletal: No acute or suspicious abnormalities.  CT ABDOMEN AND PELVIS FINDINGS  Please note that parenchymal abnormalities may be missed without intravenous contrast.  Hepatobiliary: The liver and gallbladder are unremarkable. There is no evidence of biliary dilatation.  Pancreas: Unremarkable  Spleen: Unremarkable  Adrenals/Urinary Tract: Mild bilateral renal atrophy noted. The adrenal glands and bladder are unremarkable.  Stomach/Bowel: There is no evidence of bowel obstruction or are definite bowel wall thickening. The appendix is normal.  Vascular/Lymphatic: No enlarged lymph nodes or abdominal aortic aneurysm. Mild aortic atherosclerotic calcifications noted.  Reproductive: Prostate unremarkable.  Other: A small amount of ascites within the abdomen and pelvis noted. There is no evidence of pneumoperitoneum. A small umbilical hernia containing fat is identified. Mild subcutaneous edema is present.  Musculoskeletal: No acute or suspicious abnormalities.  IMPRESSION: Mild bilateral upper lobe interstitial opacities some with a tree-in-bud type appearance which may represent infection.  Small right pleural effusion, small amount of ascites and mild subcutaneous edema.  No evidence of bowel wall thickening.   Electronically Signed   By: Margarette Canada M.D.   On: 11/27/2014 15:22   Dg Chest 2 View  11/18/2014   CLINICAL DATA:  Altered mental status. Shortness of breath and nausea today.  EXAM: CHEST  2 VIEW  COMPARISON:  11/05/2014  FINDINGS: Chronic cardiomegaly and vascular congestion, unchanged. Diminished right pleural effusion and basilar opacity from prior exam.  No new confluent airspace disease or pneumothorax. No acute osseous abnormalities.  IMPRESSION: 1. Diminished right pleural effusion and right basilar opacity. 2.  Stable chronic cardiomegaly and vascular congestion.   Electronically Signed   By: Jeb Levering M.D.   On: 11/18/2014 06:21   Dg Chest 2 View  11/05/2014   CLINICAL DATA:  Dyspnea for 1 day  EXAM: CHEST  2 VIEW  COMPARISON:  July 03, 2014  FINDINGS: The heart size and mediastinal contours are stable. There is mild patchy consolidation of lateral right lung base with small right pleural effusion. There is mild chronic central pulmonary vascular congestion. The visualized skeletal structures are stable.  IMPRESSION: Chronic central pulmonary vascular congestion.  Small right pleural effusion with associated mild patchy opacity of right lung base which could be due to atelectasis but superimposed pneumonia is not excluded.   Electronically Signed   By: Abelardo Diesel M.D.   On: 11/05/2014 21:54   Ct Head Wo Contrast  11/18/2014   CLINICAL DATA:  Altered mental status.  EXAM: CT HEAD WITHOUT CONTRAST  TECHNIQUE: Contiguous axial images were obtained from the base of the skull through the vertex without intravenous contrast.  COMPARISON:  None.  FINDINGS: No intracranial hemorrhage, mass effect, or midline shift. Small focal encephalomalacia in the left occipital lobe. No hydrocephalus. The basilar cisterns are patent. No evidence of territorial infarct. No intracranial fluid collection. Calvarium is intact. Included paranasal sinuses and mastoid air cells are well aerated.  IMPRESSION: 1.  No acute intracranial abnormality. 2. Small focal encephalomalacia in the left occipital lobe.   Electronically Signed   By: Jeb Levering M.D.   On: 11/18/2014 06:13   Ct Chest Wo Contrast  11/27/2014   CLINICAL DATA:  59 year old male with C difficile, elevated white count, altered mental status/encephalitis and diabetic ketoacidosis. Elevated LFTs and  end-stage renal disease.  EXAM: CT CHEST, ABDOMEN AND PELVIS WITHOUT CONTRAST  TECHNIQUE: Multidetector CT imaging of the chest, abdomen and pelvis was performed following the standard protocol without IV contrast.  COMPARISON:  11/18/2014 and prior chest radiographs. 11/06/2014 abdominal ultrasound.  FINDINGS: CT CHEST FINDINGS  Mediastinum/Nodes: Mild pulmonary and moderate-heavy coronary artery calcifications noted. There is no evidence of thoracic aortic aneurysm or pericardial effusion. Shotty mediastinal lymph nodes are identified.  Lungs/Pleura: A small right pleural effusion is noted. Mild basilar atelectasis identified. Mild interstitial opacities within bilateral upper lobes noted, some with a tree-in-bud type appearance-question infection. There is no definite airspace disease, consolidation, suspicious nodule, mass or endobronchial/ endotracheal lesion.  Musculoskeletal: No acute or suspicious abnormalities.  CT ABDOMEN AND PELVIS FINDINGS  Please note that parenchymal abnormalities may be missed without intravenous contrast.  Hepatobiliary: The liver and gallbladder are unremarkable. There is no evidence of biliary dilatation.  Pancreas: Unremarkable  Spleen: Unremarkable  Adrenals/Urinary Tract: Mild bilateral renal atrophy noted. The adrenal glands and bladder are unremarkable.  Stomach/Bowel: There is no evidence of bowel obstruction or are definite bowel wall thickening. The appendix is normal.  Vascular/Lymphatic: No enlarged lymph nodes or abdominal aortic aneurysm. Mild aortic atherosclerotic calcifications noted.  Reproductive: Prostate unremarkable.  Other: A small amount of ascites within the abdomen and pelvis noted. There is no evidence of pneumoperitoneum. A small umbilical hernia containing fat is identified. Mild subcutaneous edema is present.  Musculoskeletal: No acute or suspicious abnormalities.  IMPRESSION: Mild bilateral upper lobe interstitial opacities some with a tree-in-bud type  appearance which may represent infection.  Small right pleural effusion, small amount of ascites and mild subcutaneous edema.  No evidence of bowel wall thickening.   Electronically Signed   By: Cleatis Polka.D.  On: 11/27/2014 15:22   Mr Brain Wo Contrast  11/26/2014   CLINICAL DATA:  Encephalitis.  Confusion.  EXAM: MRI HEAD WITHOUT CONTRAST  TECHNIQUE: Multiplanar, multiecho pulse sequences of the brain and surrounding structures were obtained without intravenous contrast.  COMPARISON:  MRI of the brain 11/21/2014.  FINDINGS: Restricted diffusion within the hippocampal structures bilaterally is somewhat less conspicuous than on the prior exam. Diffuse T2 signal remains in the medial temporal lobes. There is increased swelling in the left medial temporal lobe compared to the prior exam. The right hippocampus is similar to the prior exam.  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 Scattered periventricular and subcortical T2 changes are otherwise stable. No other new areas of signal abnormality are evident.  Flow was present in the major intracranial arteries. The globes orbits are intact. The paranasal sinuses are clear. There is some fluid in the inferior mastoid air cells. No obstructing nasopharyngeal lesion is present. Skullbase is within normal limits. Midline structures are unremarkable.  IMPRESSION: 1. Diffusion abnormality in the medial temporal lobes and hippocampal structures is improving. 2. Swelling and T2 signal is more prominent in the left medial temporal lobe and hippocampus. This may be related to seizure activity given the abnormal EEG and negative lumbar puncture. 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   Electronically Signed   By: San Morelle M.D.   On: 11/26/2014 17:14   Mr Brain Wo Contrast  11/21/2014   CLINICAL DATA:  Altered mental status. Encephalopathy. Atrial fibrillation.  EXAM: MRI HEAD WITHOUT CONTRAST  TECHNIQUE: Multiplanar, multiecho pulse  sequences of the brain and surrounding structures were obtained without intravenous contrast.  COMPARISON:  CT head without contrast from the same day.  FINDINGS: Restricted diffusion and increased T2 signal is present within the medial temporal lobes and hippocampal structures bilaterally. Inferior temporal lobes are intact.  Moderate generalized atrophy and diffuse periventricular T2 changes bilaterally are advanced for age. The ventricles are of normal size.  Flow is present in the major intracranial arteries. The globes and orbits are intact. The paranasal sinuses are clear. There is some fluid in the right mastoid air cells. No obstructing nasopharyngeal lesion is evident.  IMPRESSION: Abnormal T2 signal and restricted diffusion within the medial temporal lobes and hippocampus bilaterally. 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 There is no hemorrhage or signal change along the undersurface the temporal lobes, but herpes encephalitis is also considered. Status epilepticus is also on the differential diagnosis.  Atrophy in T2 signal changes are advanced for age. This is nonspecific, but likely reflects the sequela of chronic microvascular ischemia.  These results will be called to the ordering clinician or representative by the Radiologist Assistant, and communication documented in the PACS or zVision Dashboard.   Electronically Signed   By: San Morelle M.D.   On: 11/21/2014 19:01   US Abdomen Complete  11/06/2014   CLINICAL DATA:  Abnormal liver function tests  EXAM: ULTRASOUND ABDOMEN COMPLETE  COMPARISON:  None.  FINDINGS: Gallbladder: No gallstones or wall thickening visualized. No sonographic Murphy sign noted.  Common bile duct: Diameter: 4 mm  Liver: Minimally nodular contour with no focal abnormalities.  IVC: No abnormality visualized.  Pancreas: Visualized portion unremarkable.  Spleen: Size and appearance within normal limits.  Right Kidney: Length: 10.7 cm. Midpole cyst  measuring 14 mm. Mildly increased echogenicity.  Left Kidney: Length: 10.8 cm. Lower pole cyst measures 1 cm. Mildly increased echogenicity.  Abdominal aorta: No aneurysm identified.  Limited visualization distally due to bowel gas.  Other findings: Right pleural effusion.  Small volume ascites.  IMPRESSION: Mildly nodular liver contour with small volume of ascites and right pleural effusion. Possibility of cirrhosis not excluded.  Evidence of medical renal disease.   Electronically Signed   By: Skipper Cliche M.D.   On: 11/06/2014 21:38   Ir US Guide Vasc Access Right  11/26/2014   CLINICAL DATA:  Atrial fibrillation  EXAM: RIGHT JUGULAR TUNNELED PICC LINE PLACEMENT WITH ULTRASOUND AND FLUOROSCOPIC GUIDANCE  FLUOROSCOPY TIME:  18 seconds in  PROCEDURE: The patient was advised of the possible risks andcomplications and agreed to undergo the procedure. The patient was then brought to the angiographic suite for the procedure.  The right neck was prepped with chlorhexidine, drapedin the usual sterile fashion using maximum barrier technique (cap and mask, sterile gown, sterile gloves, large sterile sheet, hand hygiene and cutaneous antisepsis) and infiltrated locally with 1% Lidocaine.  Ultrasound demonstrated patency of the right internal jugular vein, and this was documented with an image. Under real-time ultrasound guidance, this vein was accessed with a 21 gauge micropuncture needle and image documentation was performed. A 0.018 wire was introduced in to the vein. Over this, a 5 [place] double lumen Power tunneled PICC was advanced to the lower SVC/right atrial junction. The cuff was positioned in the subcutaneous tract. Fluoroscopy during the procedure and fluoro spot radiograph confirms appropriate catheter position. The catheter was flushed and covered with asterile dressing.  Catheter length: 21 cm  COMPLICATIONS: None  IMPRESSION: Successful right jugular tunneled power PICC line placement with ultrasound and  fluoroscopic guidance. The catheter is ready for use.   Electronically Signed   By: Marybelle Killings M.D.   On: 11/26/2014 16:15   Dg Chest Port 1 View  11/30/2014   CLINICAL DATA:  Cough, shortness of breath, history hypertension, diabetes mellitus, CHF, chronic kidney disease, atrial fibrillation  EXAM: PORTABLE CHEST - 1 VIEW  COMPARISON:  Portable exam 1238 hours compared to 11/18/2014  FINDINGS: RIGHT jugular central venous catheter with tip projecting over SVC near cavoatrial junction.  Upper normal heart size.  Mediastinal contours and pulmonary vascularity normal.  Infiltrate RIGHT lower lobe.  Remaining lungs clear.  No pleural effusion or pneumothorax.  IMPRESSION: RIGHT lower lobe infiltrate consistent with pneumonia.  No pneumothorax following central line placement.   Electronically Signed   By: Lavonia Dana M.D.   On: 11/30/2014 13:01   Ir Fluoro Guide Cv Midline Picc Right  11/26/2014   CLINICAL DATA:  Atrial fibrillation  EXAM: RIGHT JUGULAR TUNNELED PICC LINE PLACEMENT WITH ULTRASOUND AND FLUOROSCOPIC GUIDANCE  FLUOROSCOPY TIME:  18 seconds in  PROCEDURE: The patient was advised of the possible risks andcomplications and agreed to undergo the procedure. The patient was then brought to the angiographic suite for the procedure.  The right neck was prepped with chlorhexidine, drapedin the usual sterile fashion using maximum barrier technique (cap and mask, sterile gown, sterile gloves, large sterile sheet, hand hygiene and cutaneous antisepsis) and infiltrated locally with 1% Lidocaine.  Ultrasound demonstrated patency of the right internal jugular vein, and this was documented with an image. Under real-time ultrasound guidance, this vein was accessed with a 21 gauge micropuncture needle and image documentation was performed. A 0.018 wire was introduced in to the vein. Over this, a 5 [place] double lumen Power tunneled PICC was advanced to the lower SVC/right atrial junction. The cuff was positioned in  the subcutaneous tract. Fluoroscopy during the  procedure and fluoro spot radiograph confirms appropriate catheter position. The catheter was flushed and covered with asterile dressing.  Catheter length: 21 cm  COMPLICATIONS: None  IMPRESSION: Successful right jugular tunneled power PICC line placement with ultrasound and fluoroscopic guidance. The catheter is ready for use.   Electronically Signed   By: Marybelle Killings M.D.   On: 11/26/2014 16:15    Thurnell Lose, MD  [facility] Pager:[phone]  If 7PM-7AM, please contact night-coverage [email or web address] Password [email or web address] 11/30/2014, 1:21 PM   LOS: 12 days

## 2014-11-30 NOTE — Care Management Note (Signed)
Case Management Note  Patient Details  Name: DEVONE TOUSLEY MRN: [id number] Date of Birth: [birth year]  Subjective/Objective:    Patient with new rash on his back.   Patient will be going to snf this weekend.  CSW aware.               Action/Plan:   Expected Discharge Date:                  Expected Discharge Plan:  Skilled Nursing Facility  In-House Referral:  Clinical Social Work  Discharge planning Services  CM Consult  Post Acute Care Choice:    Choice offered to:     DME Arranged:    DME Agency:     HH Arranged:    [place] Agency:     Status of Service:  Completed, signed off  Medicare Important Message Given:  Yes-fourth notification given Date Medicare IM Given:    Medicare IM give by:    Date Additional Medicare IM Given:    Additional Medicare Important Message give by:     If discussed at [place] of Stay Meetings, dates discussed:    Additional Comments:  [name], RN 11/30/2014, 3:27 PM

## 2014-12-01 ENCOUNTER — Inpatient Hospital Stay (HOSPITAL_COMMUNITY): Payer: Medicare Other

## 2014-12-01 LAB — PROTIME-INR
INR: 1.44 (ref 0.00–1.49)
Prothrombin Time: 17.6 seconds — ABNORMAL HIGH (ref 11.6–15.2)

## 2014-12-01 LAB — CBC WITH DIFFERENTIAL/PLATELET
Basophils Absolute: 0 10*3/uL (ref 0.0–0.1)
Basophils Relative: 0 % (ref 0–1)
EOS ABS: 0.2 10*3/uL (ref 0.0–0.7)
EOS PCT: 1 % (ref 0–5)
HCT: 30.1 % — ABNORMAL LOW (ref 39.0–52.0)
HEMOGLOBIN: 9.8 g/dL — AB (ref 13.0–17.0)
LYMPHS ABS: 0.7 10*3/uL (ref 0.7–4.0)
Lymphocytes Relative: 4 % — ABNORMAL LOW (ref 12–46)
MCH: 30.7 pg (ref 26.0–34.0)
MCHC: 32.6 g/dL (ref 30.0–36.0)
MCV: 94.4 fL (ref 78.0–100.0)
MONO ABS: 0.4 10*3/uL (ref 0.1–1.0)
MONOS PCT: 3 % (ref 3–12)
NEUTROS PCT: 92 % — AB (ref 43–77)
Neutro Abs: 14.3 10*3/uL — ABNORMAL HIGH (ref 1.7–7.7)
Platelets: 122 10*3/uL — ABNORMAL LOW (ref 150–400)
RBC: 3.19 MIL/uL — AB (ref 4.22–5.81)
RDW: 18.3 % — AB (ref 11.5–15.5)
WBC: 15.6 10*3/uL — AB (ref 4.0–10.5)

## 2014-12-01 LAB — CBC
HEMATOCRIT: 29.8 % — AB (ref 39.0–52.0)
Hemoglobin: 9.8 g/dL — ABNORMAL LOW (ref 13.0–17.0)
MCH: 30.9 pg (ref 26.0–34.0)
MCHC: 32.9 g/dL (ref 30.0–36.0)
MCV: 94 fL (ref 78.0–100.0)
Platelets: 117 10*3/uL — ABNORMAL LOW (ref 150–400)
RBC: 3.17 MIL/uL — ABNORMAL LOW (ref 4.22–5.81)
RDW: 18.4 % — ABNORMAL HIGH (ref 11.5–15.5)
WBC: 15 10*3/uL — ABNORMAL HIGH (ref 4.0–10.5)

## 2014-12-01 LAB — RENAL FUNCTION PANEL
ALBUMIN: 2.4 g/dL — AB (ref 3.5–5.0)
Anion gap: 9 (ref 5–15)
BUN: 47 mg/dL — ABNORMAL HIGH (ref 6–20)
CALCIUM: 8.1 mg/dL — AB (ref 8.9–10.3)
CHLORIDE: 98 mmol/L — AB (ref 101–111)
CO2: 27 mmol/L (ref 22–32)
CREATININE: 3.83 mg/dL — AB (ref 0.61–1.24)
GFR calc Af Amer: 18 mL/min — ABNORMAL LOW (ref >60)
GFR calc non Af Amer: 16 mL/min — ABNORMAL LOW (ref >60)
Glucose, Bld: 78 mg/dL (ref 65–99)
Phosphorus: 3.5 mg/dL (ref 2.5–4.6)
Potassium: 3.6 mmol/L (ref 3.5–5.1)
SODIUM: 134 mmol/L — AB (ref 135–145)

## 2014-12-01 LAB — GLUCOSE, CAPILLARY
GLUCOSE-CAPILLARY: 104 mg/dL — AB (ref 65–99)
GLUCOSE-CAPILLARY: 119 mg/dL — AB (ref 65–99)
GLUCOSE-CAPILLARY: 82 mg/dL (ref 65–99)
Glucose-Capillary: 64 mg/dL — ABNORMAL LOW (ref 65–99)
Glucose-Capillary: 96 mg/dL (ref 65–99)
Glucose-Capillary: 115 mg/dL — ABNORMAL HIGH (ref 65–99)

## 2014-12-01 LAB — HEPARIN LEVEL (UNFRACTIONATED)
HEPARIN UNFRACTIONATED: 0.14 [IU]/mL — AB (ref 0.30–0.70)
Heparin Unfractionated: 0.69 IU/mL (ref 0.30–0.70)

## 2014-12-01 MED ORDER — METRONIDAZOLE 500 MG PO TABS
500.0000 mg | ORAL_TABLET | Freq: Three times a day (TID) | ORAL | Status: DC
  Filled 2014-12-01 (×3): qty 1

## 2014-12-01 MED ORDER — METOPROLOL TARTRATE 1 MG/ML IV SOLN: 5.0000 mg | INTRAVENOUS | Status: DC | PRN

## 2014-12-01 MED ORDER — VANCOMYCIN 50 MG/ML ORAL SOLUTION
250.0000 mg | Freq: Four times a day (QID) | ORAL | Status: DC
  Filled 2014-12-01 (×4): qty 5

## 2014-12-01 MED ORDER — WARFARIN SODIUM 5 MG PO TABS: 5.0000 mg | ORAL_TABLET | Freq: Once | ORAL | Status: DC

## 2014-12-01 MED ORDER — WARFARIN SODIUM 7.5 MG PO TABS
7.5000 mg | ORAL_TABLET | Freq: Once | ORAL | Status: AC
  Administered 2014-12-01: 7.5 mg via ORAL
  Filled 2014-12-01 (×2): qty 1

## 2014-12-01 MED ORDER — CEFTRIAXONE SODIUM 1 G IJ SOLR
1.0000 g | INTRAMUSCULAR | Status: DC
  Filled 2014-12-01: qty 10

## 2014-12-01 MED ORDER — FLUOCINONIDE 0.05 % EX OINT
TOPICAL_OINTMENT | Freq: Four times a day (QID) | CUTANEOUS | Status: DC
  Administered 2014-12-01 – 2014-12-02 (×6): via TOPICAL
  Filled 2014-12-01: qty 15

## 2014-12-01 NOTE — Evaluation (Signed)
SLP Cancellation Note  Patient Details Name: DAITON COWLES MRN: [id number] DOB: 03-05-1956   Cancelled treatment:       Reason Eval/Treat Not Completed: Other (comment) (pt currently at dialysis, will continue efforts)   Luanna Salk, [place] [facility] SLP [phone]

## 2014-12-01 NOTE — Progress Notes (Signed)
ANTICOAGULATION CONSULT NOTE - Follow Up Consult  Pharmacy Consult for Heparin and Warfarin Indication: Afib  Allergies  Allergen Reactions  . Dilaudid [Hydromorphone] Nausea And Vomiting    Patient Measurements: Height: 6' (182.9 cm) Weight: 177 lb 0.5 oz (80.3 kg) IBW/kg (Calculated) : 77.6  Vital Signs: Temp: 100.1 F (37.8 C) (08/13 2210) Temp Source: Oral (08/13 2210) BP: 131/67 mmHg (08/13 2210) Pulse Rate: 106 (08/13 2210)  Labs:  Recent Labs  11/29/14 0558  11/30/14 0420 11/30/14 1300 12/01/14 0505 12/01/14 0856 12/01/14 0900 12/01/14 2220  HGB 10.5*  --  11.3*  --  9.8*  --  9.8*  --   HCT 31.2*  --  34.4*  --  29.8*  --  30.1*  --   PLT 115*  --  153  --  117*  --  122*  --   LABPROT 22.0*  --  18.1*  --  17.6*  --   --   --   INR 1.93*  --  1.49  --  1.44  --   --   --   HEPARINUNFRC  --   < >  --  0.50 0.14*  --   --  0.69  CREATININE  --   --   --   --   --  3.83*  --   --   < > = values in this interval not displayed.  Estimated Creatinine Clearance: 22.8 mL/min (by C-G formula based on Cr of 3.83).   Assessment: 59 year old male on heparin to bridge warfarin for hx Afib. Heparin level is now therapeutic at 0.69 but has risen significantly and is on upper end of goal range. INR is subtherapeutic at 1.44. No bleeding noted. Coumadin dose ordered. RN reports no problems with infusion.  Goal of Therapy:  INR 2-3 Heparin level 0.3-0.7 units/ml Monitor platelets by anticoagulation protocol: Yes   Plan:  Decrease heparin to 1550 units/hr Daily HL/CBC/INR Monitor s/sx of bleeding  Albertina Parr, PharmD., BCPS Clinical Pharmacist Pager [phone]

## 2014-12-01 NOTE — Progress Notes (Signed)
[facility] Progress Note   Subjective: no complaints.   Filed Vitals:   12/01/14 0840 12/01/14 0911 12/01/14 0941 12/01/14 1011  BP: 98/66 125/66 107/47 123/66  Pulse: 102 94 88 86  Temp:      TempSrc:      Resp:      Height:      Weight:      SpO2:       Exam: General: on HD, stable, occ cough Heart: RRR  Lungs: R clear, rales L base Abd soft ntnd +bs no ascites Extremities: diffuse 1-2 + edema of arms and legs Dialysis Access: L AVG +b/t   [place] MWF 80kg 3/2.5 bath P2 AVG L arm Heparin 6100  Calcitriol 1.75 (no Fe/ esa) Last tsat 42%, ferr 1122, pth 427, Hb 11.6  Assessment: 1. AMS - some better. 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 Have d/w neurology, pt may not get back to baseline, suspects underlying dementia /liver failure/ etoh history along w renal failure all probably contributing to AMS 2. Volume excess/ hyponatremia - improving w HD 3. C. Diff colitis- on abx 4. DKA / DM type 1 - resolved 5. Seizures - on po dilantin 6. ESRD HD MWF at dry wt but needs lower dry wt 7. Enterococcal UTI- s/p 1 dose fosfomycin on 8/2 8. 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 Abnormal LFT's-? Etiology. Trending down. No abd pain. [place] 11/06/14- mild nodular liver contour w mall volume of ascitis. Possibility of cirrhosis not excluded.Ammonia level 44 on 8/2 11. Nutrition:per primary 12. A fib- on dilt/ MTP and IV hep per pharm  Plan - extra HD today for volume control   Kelly Splinter MD pager [phone] cell [phone] 11/29/2014, 11:37 AM     Recent Labs Lab 11/26/14 0951 11/28/14 1641 12/01/14 0856  NA 125* 126* 134*  K 4.3 4.7 3.6  CL 90* 93* 98*  CO2 22 21* 27  GLUCOSE 146* 322* 78  BUN 82* 89* 47*  CREATININE 5.00* 5.02* 3.83*  CALCIUM 8.0* 8.0* 8.1*  PHOS 5.0* 5.8* 3.5    Recent Labs Lab 11/25/14 0514  11/28/14 1641  11/30/14 1300 12/01/14 0856  AST 86*  --   --  34  --   ALT 119*  --   --  69*  --   ALKPHOS 191*  --   --  147*  --   BILITOT 1.2  --   --  1.2  --   PROT 5.5*  --   --  4.8*  --   ALBUMIN 2.8*  < > 2.6* 2.4* 2.4*  < > = values in this interval not displayed.  Recent Labs Lab 11/30/14 0420 12/01/14 0505 12/01/14 0900  WBC 18.0* 15.0* 15.6*  NEUTROABS  --   --  14.3*  HGB 11.3* 9.8* 9.8*  HCT 34.4* 29.8* 30.1*  MCV 93.5 94.0 94.4  PLT 153 117* 122*   . antiseptic oral rinse  7 mL Mouth Rinse 6 times per day  . calcitRIOL  1.75 mcg Oral Q M,W,F-HD  . clobetasol cream  1 application Topical TID  . colchicine  0.3 mg Oral Once per day on Mon Thu  . diltiazem  30 mg Oral 4 times per day  . fluocinonide ointment   Topical QID  . hydrocerin   Topical QID  . insulin aspart  0-15 Units Subcutaneous TID  WC  . insulin aspart  0-5 Units Subcutaneous QHS  . insulin glargine  25 Units Subcutaneous BID  . LORazepam  1 mg Intravenous Once  . metoprolol  25 mg Oral BID  . multivitamin  1 tablet Oral QHS  . pantoprazole  20 mg Oral BID  . phenytoin  300 mg Oral QHS  . saccharomyces boulardii  250 mg Oral BID  . sevelamer carbonate  1,600 mg Oral TID WC  . warfarin  7.5 mg Oral ONCE-1800  . Warfarin - Pharmacist Dosing Inpatient   Does not apply q1800   . sodium chloride 10 mL/hr at 11/18/14 2300  . sodium chloride 10 mL/hr at 11/27/14 0541  . heparin 1,350 Units/hr (11/30/14 2045)   camphor-menthol, dextrose, hydrOXYzine, metoprolol, sodium chloride

## 2014-12-01 NOTE — Progress Notes (Addendum)
ANTICOAGULATION CONSULT NOTE - Follow Up Consult  Pharmacy Consult for Heparin and Warfarin Indication: Afib  Allergies  Allergen Reactions  . Dilaudid [Hydromorphone] Nausea And Vomiting    Patient Measurements: Height: 6' (182.9 cm) Weight: 180 lb 8.9 oz (81.9 kg) IBW/kg (Calculated) : 77.6  Vital Signs: Temp: 100.1 F (37.8 C) (08/13 0525) Temp Source: Oral (08/13 0525) BP: 127/69 mmHg (08/13 0657) Pulse Rate: 94 (08/13 0657)  Labs:  Recent Labs  11/28/14 1641  11/29/14 0558 11/29/14 1635 11/30/14 0420 11/30/14 1300 12/01/14 0505  HGB  --   < > 10.5*  --  11.3*  --  9.8*  HCT  --   < > 31.2*  --  34.4*  --  29.8*  PLT  --   < > 115*  --  153  --  117*  LABPROT  --   --  22.0*  --  18.1*  --  17.6*  INR  --   --  1.93*  --  1.49  --  1.44  HEPARINUNFRC  --   --   --  0.26*  --  0.50 0.14*  CREATININE 5.02*  --   --   --   --   --   --   < > = values in this interval not displayed.  Estimated Creatinine Clearance: 17.4 mL/min (by C-G formula based on Cr of 5.02).   Assessment: 59 year old male on heparin to bridge warfarin for hx Afib. Heparin level is below goal at 0.14. INR is subtherapeutic at 1.44. No bleeding noted. RN reports no problems with infusion.  Goal of Therapy:  INR 2-3 Heparin level 0.3-0.7 units/ml Monitor platelets by anticoagulation protocol: Yes   Plan:  Increase heparin 1650 units/hr Warfarin 7.5mg  tonight x1 Daily HL/CBC/INR Monitor s/sx of bleeding  Melburn Popper, PharmD Clinical Pharmacy Resident Pager: [phone] 12/01/2014 8:43 AM

## 2014-12-01 NOTE — Evaluation (Signed)
Clinical/Bedside Swallow Evaluation Patient Details  Name: Joseph [name] MRN: [id number] Date of Birth: [birth year]  Today's Date: 12/01/2014 Time: SLP Start Time (ACUTE ONLY): 1356 SLP Stop Time (ACUTE ONLY): 1415 SLP Time Calculation (min) (ACUTE ONLY): 19 min  Past Medical History:  Past Medical History  Diagnosis Date  . Atrial fibrillation     a. 11/18/2011 s/p DCCV - 150J;  b. Anticoagulation w/ Apixaban;  c. 11/2011 back in afib->asymptomatic.  . H/O alcohol abuse     a. drinks heavily on the weekends.  . Hypertension   . Diabetes mellitus   . Heart murmur     a. 09/2011 Echo: EF 55-60%, Triv AI, Mild MR, mildly dil LA.  . Diabetic peripheral neuropathy   . CKD (chronic kidney disease), stage III   . CHF (congestive heart failure)   . Pneumonia   . Sleep apnea    Past Surgical History:  Past Surgical History  Procedure Laterality Date  . Eye sx  11/11/2011    left eye  . Cardioversion  11/18/2011    Procedure: CARDIOVERSION;  Surgeon: Josue Hector, MD;  Location: [facility] ENDOSCOPY;  Service: Cardiovascular;  Laterality: N/A;  . Colonoscopy w/ biopsies and polypectomy    . Av fistula placement Left 02/13/2014    Procedure: INSERTION OF ARTERIOVENOUS (AV) GORE-TEX GRAFT ARM;  Surgeon: Angelia Mould, MD;  Location: [place];  Service: Vascular;  Laterality: Left;  . Right heart catheterization N/A 10/11/2013    Procedure: RIGHT HEART CATH;  Surgeon: Larey Dresser, MD;  Location: [facility] CATH LAB;  Service: Cardiovascular;  Laterality: N/A;   HPI:  Joseph [name] is a 59 y.o. male, with end-stage renal disease (HD on M/W/F), C. difficile (actively being treated), A. fib, diabetes mellitus, and obstructive sleep apnea who was brought to the emergency department by his wife for confusion, elevated CBGs and nausea. The patient is lethargic and confused and unable to give history. History was collected from his wife. He was discharged from [facility] on 7/22 after being treated for  C. difficile and volume overload. He was supposed to finish his oral vancomycin on 8/1. The patient's stools were improving but over the last few days have become more frequent and soft again. Yesterday he had 4 stools and was unable to make it to the toilet in time. Also over the last 2 days he has become very confused and sleepy. He told his wife yesterday that he was speaking with his mother, but he has not seen her in 2 weeks. Also he repeatedly asked her where the dog was and then stated he was talking to the dog-but they gave the dog away last week. His wife has noticed elevated CBGs. She is uncertain if the patient has been taking his insulin correctly since he has been confused. Yesterday she stopped him from taking his Lantus a second time as he had forgotten that he already took it. Of note, the patient has been suffering with a puritic rash. He has seen Dr. Allyson Sabal (dermatology) outpatient. He finished a course of prednisone on Wednesday and has been using clobetasol cream. Unfortunately it appears the rash has spread from his back to his abdomen, arms, and legs.  In the ER the patient is calm but lethargic and very confused. He thinks it's 1997, and that we are in [place]. He is oriented to person. CBG is 1006, anion gap 17, sodium 122, k 5.8, WBC 17.9. CT scan of his head is negative  for acute abnormalities.  The patient had a chest x-ray that showed a RLL infiltrate consistent with PNA however follow up the next date did not show significant issues.     Assessment / Plan / Recommendation Clinical Impression  Clinical swallowing evaluation was completed.  The patient's oral and pharyngeal swallow appeared to be functional.  Swallow trigger was timely and hyo-laryngeal excursion appeared adequate.  The patient had mildly delayed oral transit for solids.  Recommend a regular diet and thin liquids.  ST to follow up for therapeutic diet tolerance given results of current chest x-rays.       Aspiration Risk  Mild    Diet Recommendation  (Regular diet and thin liquids.  )   Medication Administration: Whole meds with liquid    Other  Recommendations Oral Care Recommendations: Oral care BID   Follow Up Recommendations       Frequency and Duration min 1 x/week  2 weeks   Pertinent Vitals/Pain None reported.           Swallow Study    General Date of Onset: 11/18/14 Other Pertinent Information: Joseph [name] is a 59 y.o. male, with end-stage renal disease (HD on M/W/F), C. difficile (actively being treated), A. fib, diabetes mellitus, and obstructive sleep apnea who was brought to the emergency department by his wife for confusion, elevated CBGs and nausea. The patient is lethargic and confused and unable to give history. History was collected from his wife. He was discharged from [facility] on 7/22 after being treated for C. difficile and volume overload. He was supposed to finish his oral vancomycin on 8/1. The patient's stools were improving but over the last few days have become more frequent and soft again. Yesterday he had 4 stools and was unable to make it to the toilet in time. Also over the last 2 days he has become very confused and sleepy. He told his wife yesterday that he was speaking with his mother, but he has not seen her in 2 weeks. Also he repeatedly asked her where the dog was and then stated he was talking to the dog-but they gave the dog away last week. His wife has noticed elevated CBGs. She is uncertain if the patient has been taking his insulin correctly since he has been confused. Yesterday she stopped him from taking his Lantus a second time as he had forgotten that he already took it. Of note, the patient has been suffering with a puritic rash. He has seen Dr. Allyson Sabal (dermatology) outpatient. He finished a course of prednisone on Wednesday and has been using clobetasol cream. Unfortunately it appears the rash has spread from his back to his  abdomen, arms, and legs.  In the ER the patient is calm but lethargic and very confused. He thinks it's 1997, and that we are in [place]. He is oriented to person. CBG is 1006, anion gap 17, sodium 122, k 5.8, WBC 17.9. CT scan of his head is negative for acute abnormalities.  The patient had a chest x-ray that showed a RLL infiltrate consistent with PNA however follow up the next date did not show significant issues.   Type of Study: Bedside swallow evaluation Previous Swallow Assessment: None noted.   Diet Prior to this Study: Regular;Thin liquids Temperature Spikes Noted: Yes Respiratory Status: Supplemental O2 delivered via (comment) History of Recent Intubation: No Behavior/Cognition: Alert;Cooperative;Pleasant mood Oral Cavity - Dentition:  (Full upper and lower plate.  ) Self-Feeding Abilities: Able to feed self  Patient Positioning: Upright in bed Baseline Vocal Quality: Normal Volitional Cough: Strong Volitional Swallow: Able to elicit    Oral/Motor/Sensory Function Overall Oral Motor/Sensory Function: Appears within functional limits for tasks assessed Labial ROM: Within Functional Limits Labial Symmetry: Within Functional Limits Labial Strength: Within Functional Limits Lingual ROM: Within Functional Limits Lingual Symmetry: Within Functional Limits Lingual Strength: Within Functional Limits Facial ROM: Within Functional Limits Facial Symmetry: Within Functional Limits Facial Strength: Within Functional Limits Mandible: Within Functional Limits   Ice Chips Ice chips: Not tested   Thin Liquid Thin Liquid: Within functional limits    Nectar Thick Nectar Thick Liquid: Not tested   Honey Thick Honey Thick Liquid: Not tested   Puree Puree: Within functional limits   Solid   GO    Solid: Impaired Presentation: Self Fed Oral Phase Impairments: Impaired anterior to posterior transit Oral Phase Functional Implications:  (delayed oral transit)       Shelly Flatten N 12/01/2014,2:41 PM  Shelly Flatten, [place] [place] [facility] SLP [phone]

## 2014-12-01 NOTE — Progress Notes (Signed)
PATIENT DETAILS Name: Joseph [name] Age: 59 y.o. Sex: male Date of Birth: March 04, 1956 Admit Date: 11/18/2014 Admitting Physician Theodis Blaze, MD [id number], REGINA, NP  Brief narrative:   59 year old male with history of end-stage renal disease on hemodialysis, recent hospitalization and discharge on 7/22 for C. difficile colitis, presented on 7/31 with altered mental status, diabetic ketoacidosis, and significant leukocytosis. Initially, altered mental status was felt to be from toxic metabolic encephalopathy setting of C. difficile with sepsis and DKA. 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 Neurology was subsequently consulted, underwent lumbar puncture on 8/5-CSF did not show any major abnormalities, neurology now has started the patient on high-dose steroids. See below for further details  Subjective:  Patient in bed, denies headache, no chest abdominal pain. Mildly confused. No focal weakness.  Assessment/Plan:   Acute encephalopathy: Initially suspected to be from toxic metabolic encephalopathy from mild DKA, C. difficile colitis. Since continued to have confusion inspite of improvement in blood sugars and C. 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 Repeat MRI 11-26-14 mild improvement, stable repeat EEG.  Was seen by neurology underwent LP which was unimpressive. Initially given a trial of acyclovir which was stopped by neurology.   Currently on Dilantin for his seizures he has finished his 5 day course of 1 g IV Solu-Medrol. 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 ID has been consult it. ID recommends stopping IV steroids which patient has already finished the course of. Repeat LP requested by ID if  patient gets worse. I have informed neurology of the same on 11-29-14 discussed with Dr. Nicole Kindred again personally on 11/30/2014. No change in treatment per Dr. Nicole Kindred.  Pending CSF serology - Lyme studies pending. CSF NMDA Ab and paraneoplastic panel pending as well.      C. difficile colitis with sepsis: diarrhea improved, treated with high-dose oral vancomycin- started 7/31  will finish his course on 11/30/2014.   New right lower lobe infiltrate on 11/30/2014.  Repeat chest x-ray on 12/01/2014 shows improvement in the right lower lobe infiltrate, continues to have no cough or shortness of breath. Will monitor clinically. Will monitor temperature curve is well.   Atrial fibrillation with RVR: heart rate much better after adjustment of meds-continue cardizem and metoprolol.  Chads2vasc score of 2, continue Coumadin and heparin overlap pharmacy monitoring.    Lab Results  Component Value Date   INR 1.44 12/01/2014   INR 1.49 11/30/2014   INR 1.93* 11/29/2014     Mildly elevated Troponin:secondary to demand ischemia/RVR/CKD. No further recommendations from cards. On beta blocker continue. This was not ACS.   Elevated LFT's: ?etiology-suspect shock liver from sepsis.USG abd and recently questions mild cirrhosis, Hepatitis serology neg. LFT's downtrending, ferritin level was elevated will have outpatient GI follow-up.   End-stage renal disease: On hemodialysis-Monday, Wednesday and Friday. Nephrology following   Gout: Continue with colcichine   Chronic skin rash: I Discussed his case with his dermatologist Dr. Allyson Sabal who describes this as nonspecific dermatitis. Supportive care only.   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 Question paraneoplastic presentation of occult malignancy, will check CT chest abdomen pelvis on 11/27/2014.  His custody again with dermatologist's PA, extended her pictures of  his rash which are consistent with nonspecific dermatitis with excoriation. New steroids cream started per the recommendation. Wife updated.   DKA in DM2 : poor outpatient control, DKA has resolved, he was kept on glucose stabilizer here while he was taking 1 g IV Solu-Medrol a daily basis. He has finished his steroids course and will be transitioned to Lantus twice a day along with sliding scale on 11/29/2014 with monitor CBGs closely.  CBG (last 3)   Recent Labs  12/01/14 0021 12/01/14 0736 12/01/14 0811  GLUCAP 119* 64* 82    Lab Results  Component Value Date   HGBA1C 9.4* 11/07/2014      Disposition:  SNF discharge in the next 1-2 days  Antimicrobial agents   See below  Anti-infectives    Start     Dose/Rate Route Frequency Ordered Stop   12/01/14 1200  vancomycin (VANCOCIN) 50 mg/mL oral solution 250 mg  Status:  Discontinued     250 mg Oral 4 times per day 12/01/14 1008 12/01/14 1010   12/01/14 1100  metroNIDAZOLE (FLAGYL) tablet 500 mg  Status:  Discontinued     500 mg Oral 3 times per day 12/01/14 1007 12/01/14 1010   12/01/14 1100  cefTRIAXone (ROCEPHIN) 1 g in dextrose 5 % 50 mL IVPB  Status:  Discontinued     1 g 100 mL/hr over 30 Minutes Intravenous Every 24 hours 12/01/14 1007 12/01/14 1010   11/26/14 1200  vancomycin (VANCOCIN) 50 mg/mL oral solution 500 mg  Status:  Discontinued     500 mg Oral 4 times per day 11/26/14 0938 11/30/14 1320   11/22/14 1015  acyclovir (ZOVIRAX) 800 mg in dextrose 5 % 150 mL IVPB  Status:  Discontinued     800 mg 166 mL/hr over 60 Minutes Intravenous Every 12 hours 11/22/14 1001 11/22/14 1009   11/22/14 1015  acyclovir (ZOVIRAX) 400 mg in dextrose 5 % 100 mL IVPB  Status:  Discontinued     400 mg 108 mL/hr over 60 Minutes Intravenous Every 24 hours 11/22/14 1010 11/25/14 0758   11/22/14 1000  acyclovir (ZOVIRAX) 200 MG capsule 200 mg  Status:  Discontinued     200 mg Oral 3 times daily 11/22/14 0826 11/22/14 1000   11/19/14  1400  vancomycin (VANCOCIN) 50 mg/mL oral solution 500 mg     500 mg Oral 4 times per day 11/19/14 1001 11/25/14 1359   11/18/14 1400  vancomycin (VANCOCIN) 125 MG capsule 250 mg  Status:  Discontinued     250 mg Oral 4 times daily 11/18/14 1128 11/18/14 1248   11/18/14 1400  vancomycin (VANCOCIN) 50 mg/mL oral solution 125 mg  Status:  Discontinued     125 mg Oral 4 times per day 11/18/14 1248 11/18/14 1320   11/18/14 1400  vancomycin (VANCOCIN) 50 mg/mL oral solution 250 mg  Status:  Discontinued     250 mg Oral 4 times per day 11/18/14 1320 11/19/14 1001      DVT Prophylaxis: Coumadin/Heparin gtt  Code Status: Full code   Family Communication Wife and mother-in-law bedside on 11/27/2014 , 11/30/2014  Procedures:  LP. Showing mildly increased protein levels  EEG - recorded two seizures which appear to arise from the left hemisphere as well as signs of irritability with potential for seizure origination in the right temporal region as well.     CONSULTS:  nephrology , Cards, neurology, ID   Time spent  35 minutes-Greater than 50% of this time was spent in counseling, explanation of diagnosis, planning of further management, and coordination of care.  MEDICATIONS: Scheduled Meds: . antiseptic oral rinse  7 mL Mouth Rinse 6 times per day  . calcitRIOL  1.75 mcg Oral Q M,W,F-HD  . clobetasol cream  1 application Topical TID  . colchicine  0.3 mg Oral Once per day on Mon Thu  . diltiazem  30 mg Oral 4 times per day  . fluocinonide ointment   Topical QID  . hydrocerin   Topical QID  . insulin aspart  0-15 Units Subcutaneous TID WC  . insulin aspart  0-5 Units Subcutaneous QHS  . insulin glargine  25 Units Subcutaneous BID  . LORazepam  1 mg Intravenous Once  . metoprolol  25 mg Oral BID  . multivitamin  1 tablet Oral QHS  . pantoprazole  20 mg Oral BID  . phenytoin  300 mg Oral QHS  . saccharomyces boulardii  250 mg Oral BID  . sevelamer carbonate  1,600 mg Oral TID  WC  . warfarin  5 mg Oral ONCE-1800  . Warfarin - Pharmacist Dosing Inpatient   Does not apply q1800   Continuous Infusions: . sodium chloride 10 mL/hr at 11/18/14 2300  . sodium chloride 10 mL/hr at 11/27/14 0541  . heparin 1,350 Units/hr (11/30/14 2045)   PRN Meds:.camphor-menthol, dextrose, hydrOXYzine, metoprolol, sodium chloride    PHYSICAL EXAM: Vital signs in last 24 hours: Filed Vitals:   12/01/14 0525 12/01/14 0653 12/01/14 0656 12/01/14 0657  BP: 116/75 113/68 121/75 127/69  Pulse: 107 97 104 94  Temp: 100.1 F (37.8 C)     TempSrc: Oral     Resp: 15     Height:      Weight:      SpO2: 100%       Weight change:  Filed Weights   11/28/14 1945 11/30/14 1513 11/30/14 1813  Weight: 80.5 kg (177 lb 7.5 oz) 84.7 kg (186 lb 11.7 oz) 81.9 kg (180 lb 8.9 oz)   Body mass index is 24.48 kg/(m^2).   Gen Exam: Awake/alert-answers few questions appropriately today Neck: Supple, No JVD.   Chest: B/L Clear.   CVS: S1 S2 Regular, no murmurs.  Abdomen: soft, BS +, non tender, non distended.  Extremities: no edema, lower extremities warm to touch. Neurologic: Non Focal.  Skin:papular dry rash all over mostly on the back Wounds: N/A.    Intake/Output from previous day:  Intake/Output Summary (Last 24 hours) at 12/01/14 1010 Last data filed at 12/01/14 0800  Gross per 24 hour  Intake    880 ml  Output   3290 ml  Net  -2410 ml     LAB RESULTS: CBC  Recent Labs Lab 11/28/14 2112 11/29/14 0558 11/30/14 0420 12/01/14 0505 12/01/14 0900  WBC 14.0* 13.2* 18.0* 15.0* 15.6*  HGB 10.9* 10.5* 11.3* 9.8* 9.8*  HCT 31.8* 31.2* 34.4* 29.8* 30.1*  PLT 117* 115* 153 117* 122*  MCV 91.6 94.3 93.5 94.0 94.4  MCH 31.4 31.7 30.7 30.9 30.7  MCHC 34.3 33.7 32.8 32.9 32.6  RDW 17.5* 17.6* 18.0* 18.4* 18.3*  LYMPHSABS  --   --   --   --  0.7  MONOABS  --   --   --   --  0.4  EOSABS  --   --   --   --  0.2  BASOSABS  --   --   --   --  0.0    Chemistries   Recent  Labs Lab 11/25/14 0514 11/26/14 0951 11/28/14 1641 12/01/14 0856  NA 126* 125* 126* 134*  K 4.5 4.3 4.7 3.6  CL 91* 90* 93* 98*  CO2 26 22 21* 27  GLUCOSE 498* 146* 322* 78  BUN 56* 82* 89* 47*  CREATININE 4.40* 5.00* 5.02* 3.83*  CALCIUM 7.7* 8.0* 8.0* 8.1*    CBG:  Recent Labs Lab 11/30/14 1852 11/30/14 2106 12/01/14 0021 12/01/14 0736 12/01/14 0811  GLUCAP 94 145* 119* 64* 82    GFR Estimated Creatinine Clearance: 22.8 mL/min (by C-G formula based on Cr of 3.83).  Coagulation profile  Recent Labs Lab 11/27/14 0723 11/28/14 0612 11/29/14 0558 11/30/14 0420 12/01/14 0505  INR 2.43* 2.96* 1.93* 1.49 1.44    Cardiac Enzymes No results for input(s): CKMB, TROPONINI, MYOGLOBIN in the last 168 hours.  Invalid input(s): CK  Invalid input(s): POCBNP No results for input(s): DDIMER in the last 72 hours. No results for input(s): HGBA1C in the last 72 hours. No results for input(s): CHOL, HDL, LDLCALC, TRIG, CHOLHDL, LDLDIRECT in the last 72 hours. No results for input(s): TSH, T4TOTAL, T3FREE, THYROIDAB in the last 72 hours.  Invalid input(s): FREET3 No results for input(s): VITAMINB12, FOLATE, FERRITIN, TIBC, IRON, RETICCTPCT in the last 72 hours. No results for input(s): LIPASE, AMYLASE in the last 72 hours.  Urine Studies No results for input(s): UHGB, CRYS in the last 72 hours.  Invalid input(s): UACOL, UAPR, USPG, UPH, UTP, UGL, UKET, UBIL, UNIT, UROB, ULEU, UEPI, UWBC, URBC, UBAC, CAST, UCOM, BILUA  MICROBIOLOGY: Recent Results (from the past 240 hour(s))  CSF culture with Stat gram stain     Status: None   Collection Time: 11/23/14  4:30 PM  Result Value Ref Range Status   Specimen Description CSF  Final   Special Requests NONE  Final   Gram Stain   Final    WBC PRESENT, PREDOMINANTLY MONONUCLEAR NO ORGANISMS SEEN CYTOSPIN    Culture NO GROWTH 3 DAYS  Final   Report Status 11/27/2014 FINAL  Final    RADIOLOGY STUDIES/RESULTS: Ct Abdomen  Pelvis Wo Contrast  11/27/2014   CLINICAL DATA:  58 year old male with C difficile, elevated white count, altered mental status/encephalitis and diabetic ketoacidosis. Elevated LFTs and end-stage renal disease.  EXAM: CT CHEST, ABDOMEN AND PELVIS WITHOUT CONTRAST  TECHNIQUE: Multidetector CT imaging of the chest, abdomen and pelvis was performed following the standard protocol without IV contrast.  COMPARISON:  11/18/2014 and prior chest radiographs. 11/06/2014 abdominal ultrasound.  FINDINGS: CT CHEST FINDINGS  Mediastinum/Nodes: Mild pulmonary and moderate-heavy coronary artery calcifications noted. There is no evidence of thoracic aortic aneurysm or pericardial effusion. Shotty mediastinal lymph nodes are identified.  Lungs/Pleura: A small right pleural effusion is noted. Mild basilar atelectasis identified. Mild interstitial opacities within bilateral upper lobes noted, some with a tree-in-bud type appearance-question infection. There is no definite airspace disease, consolidation, suspicious nodule, mass or endobronchial/ endotracheal lesion.  Musculoskeletal: No acute or suspicious abnormalities.  CT ABDOMEN AND PELVIS FINDINGS  Please note that parenchymal abnormalities may be missed without intravenous contrast.  Hepatobiliary: The liver and gallbladder are unremarkable. There is no evidence of biliary dilatation.  Pancreas: Unremarkable  Spleen: Unremarkable  Adrenals/Urinary Tract: Mild bilateral renal atrophy noted. The adrenal glands and bladder are unremarkable.  Stomach/Bowel: There is no evidence of bowel obstruction or are definite bowel wall thickening. The appendix is normal.  Vascular/Lymphatic: No enlarged lymph nodes or abdominal aortic aneurysm. Mild  aortic atherosclerotic calcifications noted.  Reproductive: Prostate unremarkable.  Other: A small amount of ascites within the abdomen and pelvis noted. There is no evidence of pneumoperitoneum. A small umbilical hernia containing fat is  identified. Mild subcutaneous edema is present.  Musculoskeletal: No acute or suspicious abnormalities.  IMPRESSION: Mild bilateral upper lobe interstitial opacities some with a tree-in-bud type appearance which may represent infection.  Small right pleural effusion, small amount of ascites and mild subcutaneous edema.  No evidence of bowel wall thickening.   Electronically Signed   By: Margarette Canada M.D.   On: 11/27/2014 15:22   Dg Chest 2 View  11/18/2014   CLINICAL DATA:  Altered mental status. Shortness of breath and nausea today.  EXAM: CHEST  2 VIEW  COMPARISON:  11/05/2014  FINDINGS: Chronic cardiomegaly and vascular congestion, unchanged. Diminished right pleural effusion and basilar opacity from prior exam. No new confluent airspace disease or pneumothorax. No acute osseous abnormalities.  IMPRESSION: 1. Diminished right pleural effusion and right basilar opacity. 2. Stable chronic cardiomegaly and vascular congestion.   Electronically Signed   By: Jeb Levering M.D.   On: 11/18/2014 06:21   Dg Chest 2 View  11/05/2014   CLINICAL DATA:  Dyspnea for 1 day  EXAM: CHEST  2 VIEW  COMPARISON:  July 03, 2014  FINDINGS: The heart size and mediastinal contours are stable. There is mild patchy consolidation of lateral right lung base with small right pleural effusion. There is mild chronic central pulmonary vascular congestion. The visualized skeletal structures are stable.  IMPRESSION: Chronic central pulmonary vascular congestion.  Small right pleural effusion with associated mild patchy opacity of right lung base which could be due to atelectasis but superimposed pneumonia is not excluded.   Electronically Signed   By: Abelardo Diesel M.D.   On: 11/05/2014 21:54   Ct Head Wo Contrast  11/18/2014   CLINICAL DATA:  Altered mental status.  EXAM: CT HEAD WITHOUT CONTRAST  TECHNIQUE: Contiguous axial images were obtained from the base of the skull through the vertex without intravenous contrast.  COMPARISON:   None.  FINDINGS: No intracranial hemorrhage, mass effect, or midline shift. Small focal encephalomalacia in the left occipital lobe. No hydrocephalus. The basilar cisterns are patent. No evidence of territorial infarct. No intracranial fluid collection. Calvarium is intact. Included paranasal sinuses and mastoid air cells are well aerated.  IMPRESSION: 1.  No acute intracranial abnormality. 2. Small focal encephalomalacia in the left occipital lobe.   Electronically Signed   By: Jeb Levering M.D.   On: 11/18/2014 06:13   Ct Chest Wo Contrast  11/27/2014   CLINICAL DATA:  59 year old male with C difficile, elevated white count, altered mental status/encephalitis and diabetic ketoacidosis. Elevated LFTs and end-stage renal disease.  EXAM: CT CHEST, ABDOMEN AND PELVIS WITHOUT CONTRAST  TECHNIQUE: Multidetector CT imaging of the chest, abdomen and pelvis was performed following the standard protocol without IV contrast.  COMPARISON:  11/18/2014 and prior chest radiographs. 11/06/2014 abdominal ultrasound.  FINDINGS: CT CHEST FINDINGS  Mediastinum/Nodes: Mild pulmonary and moderate-heavy coronary artery calcifications noted. There is no evidence of thoracic aortic aneurysm or pericardial effusion. Shotty mediastinal lymph nodes are identified.  Lungs/Pleura: A small right pleural effusion is noted. Mild basilar atelectasis identified. Mild interstitial opacities within bilateral upper lobes noted, some with a tree-in-bud type appearance-question infection. There is no definite airspace disease, consolidation, suspicious nodule, mass or endobronchial/ endotracheal lesion.  Musculoskeletal: No acute or suspicious abnormalities.  CT ABDOMEN AND PELVIS FINDINGS  Please note that parenchymal abnormalities may be missed without intravenous contrast.  Hepatobiliary: The liver and gallbladder are unremarkable. There is no evidence of biliary dilatation.  Pancreas: Unremarkable  Spleen: Unremarkable  Adrenals/Urinary Tract:  Mild bilateral renal atrophy noted. The adrenal glands and bladder are unremarkable.  Stomach/Bowel: There is no evidence of bowel obstruction or are definite bowel wall thickening. The appendix is normal.  Vascular/Lymphatic: No enlarged lymph nodes or abdominal aortic aneurysm. Mild aortic atherosclerotic calcifications noted.  Reproductive: Prostate unremarkable.  Other: A small amount of ascites within the abdomen and pelvis noted. There is no evidence of pneumoperitoneum. A small umbilical hernia containing fat is identified. Mild subcutaneous edema is present.  Musculoskeletal: No acute or suspicious abnormalities.  IMPRESSION: Mild bilateral upper lobe interstitial opacities some with a tree-in-bud type appearance which may represent infection.  Small right pleural effusion, small amount of ascites and mild subcutaneous edema.  No evidence of bowel wall thickening.   Electronically Signed   By: Margarette Canada M.D.   On: 11/27/2014 15:22   Mr Brain Wo Contrast  11/26/2014   CLINICAL DATA:  Encephalitis.  Confusion.  EXAM: MRI HEAD WITHOUT CONTRAST  TECHNIQUE: Multiplanar, multiecho pulse sequences of the brain and surrounding structures were obtained without intravenous contrast.  COMPARISON:  MRI of the brain 11/21/2014.  FINDINGS: Restricted diffusion within the hippocampal structures bilaterally is somewhat less conspicuous than on the prior exam. Diffuse T2 signal remains in the medial temporal lobes. There is increased swelling in the left medial temporal lobe compared to the prior exam. The right hippocampus is similar to the prior exam.  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 Scattered periventricular and subcortical T2 changes are otherwise stable. No other new areas of signal abnormality are evident.  Flow was present in the major intracranial arteries. The globes orbits are intact. The paranasal sinuses are clear. There is some fluid in the inferior mastoid air cells. No obstructing  nasopharyngeal lesion is present. Skullbase is within normal limits. Midline structures are unremarkable.  IMPRESSION: 1. Diffusion abnormality in the medial temporal lobes and hippocampal structures is improving. 2. Swelling and T2 signal is more prominent in the left medial temporal lobe and hippocampus. This may be related to seizure activity given the abnormal EEG and negative lumbar puncture. 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   Electronically Signed   By: San Morelle M.D.   On: 11/26/2014 17:14   Mr Brain Wo Contrast  11/21/2014   CLINICAL DATA:  Altered mental status. Encephalopathy. Atrial fibrillation.  EXAM: MRI HEAD WITHOUT CONTRAST  TECHNIQUE: Multiplanar, multiecho pulse sequences of the brain and surrounding structures were obtained without intravenous contrast.  COMPARISON:  CT head without contrast from the same day.  FINDINGS: Restricted diffusion and increased T2 signal is present within the medial temporal lobes and hippocampal structures bilaterally. Inferior temporal lobes are intact.  Moderate generalized atrophy and diffuse periventricular T2 changes bilaterally are advanced for age. The ventricles are of normal size.  Flow is present in the major intracranial arteries. The globes and orbits are intact. The paranasal sinuses are clear. There is some fluid in the right mastoid air cells. No obstructing nasopharyngeal lesion is evident.  IMPRESSION: Abnormal T2 signal and restricted diffusion within the medial temporal lobes and hippocampus bilaterally. 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 There is no hemorrhage or signal change along the undersurface the temporal lobes, but herpes encephalitis is also considered. Status epilepticus is also  on the differential diagnosis.  Atrophy in T2 signal changes are advanced for age. This is nonspecific, but likely reflects the sequela of chronic microvascular ischemia.  These results will be called to the  ordering clinician or representative by the Radiologist Assistant, and communication documented in the PACS or zVision Dashboard.   Electronically Signed   By: San Morelle M.D.   On: 11/21/2014 19:01   US Abdomen Complete  11/06/2014   CLINICAL DATA:  Abnormal liver function tests  EXAM: ULTRASOUND ABDOMEN COMPLETE  COMPARISON:  None.  FINDINGS: Gallbladder: No gallstones or wall thickening visualized. No sonographic Murphy sign noted.  Common bile duct: Diameter: 4 mm  Liver: Minimally nodular contour with no focal abnormalities.  IVC: No abnormality visualized.  Pancreas: Visualized portion unremarkable.  Spleen: Size and appearance within normal limits.  Right Kidney: Length: 10.7 cm. Midpole cyst measuring 14 mm. Mildly increased echogenicity.  Left Kidney: Length: 10.8 cm. Lower pole cyst measures 1 cm. Mildly increased echogenicity.  Abdominal aorta: No aneurysm identified. Limited visualization distally due to bowel gas.  Other findings: Right pleural effusion.  Small volume ascites.  IMPRESSION: Mildly nodular liver contour with small volume of ascites and right pleural effusion. Possibility of cirrhosis not excluded.  Evidence of medical renal disease.   Electronically Signed   By: Skipper Cliche M.D.   On: 11/06/2014 21:38   Ir US Guide Vasc Access Right  11/26/2014   CLINICAL DATA:  Atrial fibrillation  EXAM: RIGHT JUGULAR TUNNELED PICC LINE PLACEMENT WITH ULTRASOUND AND FLUOROSCOPIC GUIDANCE  FLUOROSCOPY TIME:  18 seconds in  PROCEDURE: The patient was advised of the possible risks andcomplications and agreed to undergo the procedure. The patient was then brought to the angiographic suite for the procedure.  The right neck was prepped with chlorhexidine, drapedin the usual sterile fashion using maximum barrier technique (cap and mask, sterile gown, sterile gloves, large sterile sheet, hand hygiene and cutaneous antisepsis) and infiltrated locally with 1% Lidocaine.  Ultrasound demonstrated  patency of the right internal jugular vein, and this was documented with an image. Under real-time ultrasound guidance, this vein was accessed with a 21 gauge micropuncture needle and image documentation was performed. A 0.018 wire was introduced in to the vein. Over this, a 5 [place] double lumen Power tunneled PICC was advanced to the lower SVC/right atrial junction. The cuff was positioned in the subcutaneous tract. Fluoroscopy during the procedure and fluoro spot radiograph confirms appropriate catheter position. The catheter was flushed and covered with asterile dressing.  Catheter length: 21 cm  COMPLICATIONS: None  IMPRESSION: Successful right jugular tunneled power PICC line placement with ultrasound and fluoroscopic guidance. The catheter is ready for use.   Electronically Signed   By: Marybelle Killings M.D.   On: 11/26/2014 16:15   Dg Chest Port 1 View  12/01/2014   CLINICAL DATA:  59 year old male with history of cough.  EXAM: PORTABLE CHEST - 1 VIEW  COMPARISON:  Chest x-ray 03/2015.  FINDINGS: There is a right-sided internal jugular central venous catheter with tip terminating in the superior cavoatrial junction. Lung volumes are slightly low. Diffuse interstitial prominence and peribronchial cuffing. No confluent consolidative airspace disease. Cephalization of the pulmonary vasculature. Mild cardiomegaly. The patient is rotated to the right on today's exam, resulting in distortion of the mediastinal contours and reduced diagnostic sensitivity and specificity for mediastinal pathology. Atherosclerosis in the thoracic aorta.  IMPRESSION: 1. Support apparatus, as above. 2. Overall, the appearance the chest suggests mild congestive heart  failure. The possibility of concurrent bronchitis is not excluded, but is unlikely unless there is a history of fever and worsening purulent sputum production. 3. Previously described right lower lobe infiltrate is less apparent, suggesting that this was atelectatic on the  prior study from 11/30/2014.   Electronically Signed   By: Vinnie Langton M.D.   On: 12/01/2014 09:57   Dg Chest Port 1 View  11/30/2014   CLINICAL DATA:  Cough, shortness of breath, history hypertension, diabetes mellitus, CHF, chronic kidney disease, atrial fibrillation  EXAM: PORTABLE CHEST - 1 VIEW  COMPARISON:  Portable exam 1238 hours compared to 11/18/2014  FINDINGS: RIGHT jugular central venous catheter with tip projecting over SVC near cavoatrial junction.  Upper normal heart size.  Mediastinal contours and pulmonary vascularity normal.  Infiltrate RIGHT lower lobe.  Remaining lungs clear.  No pleural effusion or pneumothorax.  IMPRESSION: RIGHT lower lobe infiltrate consistent with pneumonia.  No pneumothorax following central line placement.   Electronically Signed   By: Lavonia Dana M.D.   On: 11/30/2014 13:01   Ir Fluoro Guide Cv Midline Picc Right  11/26/2014   CLINICAL DATA:  Atrial fibrillation  EXAM: RIGHT JUGULAR TUNNELED PICC LINE PLACEMENT WITH ULTRASOUND AND FLUOROSCOPIC GUIDANCE  FLUOROSCOPY TIME:  18 seconds in  PROCEDURE: The patient was advised of the possible risks andcomplications and agreed to undergo the procedure. The patient was then brought to the angiographic suite for the procedure.  The right neck was prepped with chlorhexidine, drapedin the usual sterile fashion using maximum barrier technique (cap and mask, sterile gown, sterile gloves, large sterile sheet, hand hygiene and cutaneous antisepsis) and infiltrated locally with 1% Lidocaine.  Ultrasound demonstrated patency of the right internal jugular vein, and this was documented with an image. Under real-time ultrasound guidance, this vein was accessed with a 21 gauge micropuncture needle and image documentation was performed. A 0.018 wire was introduced in to the vein. Over this, a 5 [place] double lumen Power tunneled PICC was advanced to the lower SVC/right atrial junction. The cuff was positioned in the subcutaneous  tract. Fluoroscopy during the procedure and fluoro spot radiograph confirms appropriate catheter position. The catheter was flushed and covered with asterile dressing.  Catheter length: 21 cm  COMPLICATIONS: None  IMPRESSION: Successful right jugular tunneled power PICC line placement with ultrasound and fluoroscopic guidance. The catheter is ready for use.   Electronically Signed   By: Marybelle Killings M.D.   On: 11/26/2014 16:15    Thurnell Lose, MD  [facility] Pager:[phone]  If 7PM-7AM, please contact night-coverage [email or web address] Password [email or web address] 12/01/2014, 10:10 AM   LOS: 13 days

## 2014-12-02 LAB — PROTIME-INR
INR: 1.43 (ref 0.00–1.49)
Prothrombin Time: 17.6 seconds — ABNORMAL HIGH (ref 11.6–15.2)

## 2014-12-02 LAB — GLUCOSE, CAPILLARY
GLUCOSE-CAPILLARY: 56 mg/dL — AB (ref 65–99)
GLUCOSE-CAPILLARY: 83 mg/dL (ref 65–99)
GLUCOSE-CAPILLARY: 144 mg/dL — AB (ref 65–99)
Glucose-Capillary: 38 mg/dL — CL (ref 65–99)

## 2014-12-02 MED ORDER — PANTOPRAZOLE SODIUM 20 MG PO TBEC: 20.0000 mg | DELAYED_RELEASE_TABLET | Freq: Two times a day (BID) | ORAL | Status: DC

## 2014-12-02 MED ORDER — ENOXAPARIN SODIUM 150 MG/ML ~~LOC~~ SOLN: 1.0000 mg/kg | SUBCUTANEOUS | Status: DC

## 2014-12-02 MED ORDER — WARFARIN SODIUM 5 MG PO TABS: 5.0000 mg | ORAL_TABLET | Freq: Once | ORAL | Status: DC

## 2014-12-02 MED ORDER — PHENYTOIN SODIUM EXTENDED 300 MG PO CAPS: 300.0000 mg | ORAL_CAPSULE | Freq: Every day | ORAL | Status: DC

## 2014-12-02 MED ORDER — CALCITRIOL 0.25 MCG PO CAPS: 1.7500 ug | ORAL_CAPSULE | ORAL | Status: AC

## 2014-12-02 MED ORDER — METOPROLOL TARTRATE 50 MG PO TABS: 50.0000 mg | ORAL_TABLET | Freq: Two times a day (BID) | ORAL | Status: DC

## 2014-12-02 MED ORDER — SEVELAMER CARBONATE 800 MG PO TABS: 1600.0000 mg | ORAL_TABLET | Freq: Three times a day (TID) | ORAL | Status: DC

## 2014-12-02 MED ORDER — GLUCOSE 40 % PO GEL
ORAL | Status: AC
  Administered 2014-12-02: 37.5 g
  Filled 2014-12-02: qty 1

## 2014-12-02 MED ORDER — FLUOCINONIDE 0.05 % EX OINT
TOPICAL_OINTMENT | Freq: Four times a day (QID) | CUTANEOUS | Status: DC
Start: 2014-12-02 — End: 2015-01-05

## 2014-12-02 MED ORDER — WARFARIN SODIUM 7.5 MG PO TABS: 7.5000 mg | ORAL_TABLET | Freq: Once | ORAL | Status: DC

## 2014-12-02 MED ORDER — ENOXAPARIN SODIUM 80 MG/0.8ML ~~LOC~~ SOLN
1.0000 mg/kg | SUBCUTANEOUS | Status: DC
  Administered 2014-12-02: 80 mg via SUBCUTANEOUS
  Filled 2014-12-02: qty 0.8

## 2014-12-02 MED ORDER — INSULIN GLARGINE 100 UNIT/ML SOLOSTAR PEN: 20.0000 [IU] | PEN_INJECTOR | Freq: Two times a day (BID) | SUBCUTANEOUS | Status: DC

## 2014-12-02 MED ORDER — INSULIN GLARGINE 100 UNIT/ML SOLOSTAR PEN: 25.0000 [IU] | PEN_INJECTOR | Freq: Two times a day (BID) | SUBCUTANEOUS | Status: DC

## 2014-12-02 MED ORDER — HYDROCERIN EX CREA: 1.0000 "application " | TOPICAL_CREAM | Freq: Four times a day (QID) | CUTANEOUS | Status: DC

## 2014-12-02 MED ORDER — INSULIN ASPART 100 UNIT/ML ~~LOC~~ SOLN: SUBCUTANEOUS | Status: DC

## 2014-12-02 NOTE — Discharge Summary (Addendum)
Joseph [name], is a 59 y.o. male  DOB 1955/06/06  MRN [id number].  Admission date:  11/18/2014  Admitting Physician  Theodis Blaze, MD  Discharge Date:  12/02/2014   Primary MD  Webb Silversmith, NP  Recommendations for primary care physician for things to follow:   Monitor Encephalopathy clinically, needs close Neuro and Dermatology follow up.  Check CBC, CMP, INR in 2 days, stop Lovenox once INR reaches 2    Admission Diagnosis  Confusion [R41.0] Hyperglycemia [R73.9]   Discharge Diagnosis  Confusion [R41.0] Hyperglycemia [R73.9]     Principal Problem:   Delirium Active Problems:   Atrial fibrillation-permanent   Obstructive sleep apnea   Pulmonary hypertension   Rash   Volume overload   ESRD on hemodialysis   Supratherapeutic INR   C. difficile diarrhea   DKA, type 2   Hyperkalemia   Encephalopathy   Confusion   Limbic encephalitis   Encephalitis   Convulsion   Recurrent Clostridium difficile diarrhea      Past Medical History  Diagnosis Date  . Atrial fibrillation     a. 11/18/2011 s/p DCCV - 150J;  b. Anticoagulation w/ Apixaban;  c. 11/2011 back in afib->asymptomatic.  . H/O alcohol abuse     a. drinks heavily on the weekends.  . Hypertension   . Diabetes mellitus   . Heart murmur     a. 09/2011 Echo: EF 55-60%, Triv AI, Mild MR, mildly dil LA.  . Diabetic peripheral neuropathy   . CKD (chronic kidney disease), stage III   . CHF (congestive heart failure)   . Pneumonia   . Sleep apnea     Past Surgical History  Procedure Laterality Date  . Eye sx  11/11/2011    left eye  . Cardioversion  11/18/2011    Procedure: CARDIOVERSION;  Surgeon: Josue Hector, MD;  Location: [facility] ENDOSCOPY;  Service: Cardiovascular;  Laterality: N/A;  . Colonoscopy w/ biopsies and polypectomy    . Av fistula  placement Left 02/13/2014    Procedure: INSERTION OF ARTERIOVENOUS (AV) GORE-TEX GRAFT ARM;  Surgeon: Angelia Mould, MD;  Location: [place];  Service: Vascular;  Laterality: Left;  . Right heart catheterization N/A 10/11/2013    Procedure: RIGHT HEART CATH;  Surgeon: Larey Dresser, MD;  Location: [facility] CATH LAB;  Service: Cardiovascular;  Laterality: N/A;       HPI  :    59 year old male with history of end-stage renal disease on hemodialysis, recent hospitalization and discharge on 7/22 for C. difficile colitis, presented on 7/31 with altered mental status, diabetic ketoacidosis, and significant leukocytosis. Initially, altered mental status was felt to be from toxic metabolic encephalopathy setting of C. difficile with sepsis and DKA. 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 Neurology was subsequently consulted, underwent lumbar puncture on 8/5-CSF did not show any major abnormalities, neurology gave patient 5 days of 1 g IV Solu-Medrol, his repeat MRI was mildly improved showing improvement  in limbic encephalitis and repeat EEG was unremarkable.  He is still encephalopathic, needs close outpatient neurological follow-up. I have discussed his case in detail with neurologist Dr. Nicole Kindred recommends outpatient follow-up with neurologist and no change in management.   Hospital Course:     Acute encephalopathy: Initially suspected to be from toxic metabolic encephalopathy from mild DKA, C. difficile colitis. Since continued to have confusion inspite of improvement in blood sugars and C. difficile colitis-underwent further work up-MRI brain showed possible limbic encephalitis vs changes secondary to seizures, EEG also positive for seizures consistent with limbic encephalitis. Repeat MRI 11-26-14 mild improvement and questioned initial limbic encephalitis, stable repeat EEG.  Was seen by neurology  underwent LP which was unimpressive. Initially given a trial of acyclovir which was stopped by neurology.   Currently on Dilantin for his seizures he has finished his 5 day course of 1 g IV Solu-Medrol. 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 ID has been consult it. ID recommends stopping IV steroids which patient has already finished the course of. Repeat LP requested by ID if patient gets worse. I informed neurology of the same on 11-29-14 discussed with Dr. Nicole Kindred again personally on 11/30/2014. No change in treatment per Dr. Nicole Kindred. No LP unless he gets remarkably worse. No further workup per Dr. Nicole Kindred. He suggested outpatient neurological follow-up in the next 1-2 weeks.  I suspect if his limbic encephalitis vs nonspecific encephalopathy and nonspecific severe dermatitis is due to some underlying occult malignancy with paraneoplastic syndrome, CT chest abdomen pelvis were nonacute. Have explained this clearly to the wife that this point we have to monitor clinically close outpatient follow-up with neurology, dermatology and GI along with nephrology.  May consider one-time outpatient oncology follow-up.  Pending CSF serology - Lyme studies pending. CSF NMDA Ab and paraneoplastic panel pending as well.     C. difficile colitis with sepsis: Treated with 2 weeks of high-dose oral vancomycin has finished his course, C. difficile clinically resolved, diarrhea has completely resolved, avoid systemic antibiotic monitor clinically.   New right lower lobe infiltrate on 11/30/2014. Repeat chest x-ray on 12/01/2014 shows improvement in the right lower lobe infiltrate, continues to have no cough or shortness of breath. Continue to monitor clinically along with his temperature curve.   Atrial fibrillation with RVR: heart rate much better after adjustment of meds-continue cardizem and metoprolol. Chads2vasc score of 2, continue Coumadin and  Lovenox overlap , monitor INR closely and stop Lovenox once INR reaches 2.   Mildly elevated Troponin:secondary to demand ischemia/RVR/CKD. No further recommendations from cards. On beta blocker continue. This was not ACS.   Elevated LFT's: ?etiology-suspect shock liver from sepsis.USG abd and recently questions mild cirrhosis, Hepatitis serology neg. LFT's downtrending, ferritin level was elevated will have outpatient GI follow-up.   End-stage renal disease: On hemodialysis-Monday, Wednesday and Friday. Nephrology following   Gout: Continue with colcichine   Enterococcal UTI: with Dilirium given1 dose of Fosfomycin on 8/2   Nonspecific dermatitis, possible limbic encephalitis. Question paraneoplastic presentation of occult malignancy, will check CT chest abdomen pelvis on 11/27/2014. Discussed his case initially with his dermatologist is Dr. Allyson Sabal and subsequently on 11/30/2014 again with dermatologist's PA Dena, I texted rash pictures and the permission of patient and his wife of his rash which are consistent with nonspecific dermatitis with excoriation. New steroids cream started per the recommendation. Wife updated. I have set up an appointment  on coming Monday 2 PM with the dermatology team.   Nonspecific mild leukocytosis. No fevers, likely showing recent exposure to very high doses of IV steroids. Monitor clinically. No diarrhea, chest x-ray stable. No cough or shortness of breath. No aches or pains. Denies headache.   DKA in DM2 : poor outpatient control, DKA has resolved, CBGs were well controlled, slight dip in a.m glucose this morning have cut back his Lantus and sliding scale, monitor CBGs every before meals at bedtime adjust insulin as needed.   Lab Results  Component Value Date   HGBA1C 9.4* 11/07/2014      Discharge Condition: Fair  Follow UP  Follow-up Information    Follow up with Webb Silversmith, NP. Schedule an appointment as soon as possible for a visit in 1  week.   Specialty:  Internal Medicine   Contact information:   [place] [address] [phone]       Follow up with Jacolyn Reedy, MD On 12/03/2014.   Specialty:  Dermatology   Why:  2.30pm   Contact information:   [place] [address] [phone]       Follow up with [facility]. Schedule an appointment as soon as possible for a visit in 1 week.   Contact information:   [address] [place] [address] [phone]      Follow up with Silvano Rusk, MD. Schedule an appointment as soon as possible for a visit in 1 week.   Specialty:  Gastroenterology   Why:  Ferritin greater than 1000 with elevated liver enzymes   Contact information:   [address] [place] [address] [phone]        Consults obtained -  nephrology , Cards, neurology, ID  Diet and Activity recommendation: See Discharge Instructions below  Discharge Instructions           Discharge Instructions    Discharge instructions    Complete by:  As directed   Follow with Primary MD Webb Silversmith, NP in 2 days   Get CBC, CMP, INR, 2 view Chest X ray checked  by SNF M.D. on Monday tomorrow.   Activity: As tolerated with Full fall precautions use walker/cane & assistance as needed   Disposition SNF     Diet: Renal Low Carb with feeding assistance and aspiration precautions.  For Heart failure patients - Check your Weight same time everyday, if you gain over 2 pounds, or you develop in leg swelling, experience more shortness of breath or chest pain, call your Primary MD immediately. Follow Cardiac Low Salt Diet and 1.5 lit/day fluid restriction.   On your next visit with your primary care physician please Get Medicines reviewed and adjusted.   Please request your Prim.MD to go over all Hospital Tests and Procedure/Radiological results at the follow up, please get all Hospital records sent  to your Prim MD by signing hospital release before you go home.   If you experience worsening of your admission symptoms, develop shortness of breath, life threatening emergency, suicidal or homicidal thoughts you must seek medical attention immediately by calling 911 or calling your MD immediately  if symptoms less severe.  You Must read complete instructions/literature along with all the possible adverse reactions/side effects for all the Medicines you take and that have been prescribed to you. Take any new Medicines after you have completely understood and accpet all the possible adverse reactions/side effects.   Do not  drive, operating heavy machinery, perform activities at heights, swimming or participation in water activities or provide baby sitting services if your were admitted for syncope or siezures until you have seen by Primary MD or a Neurologist and advised to do so again.  Do not drive when taking Pain medications.    Do not take more than prescribed Pain, Sleep and Anxiety Medications  Special Instructions: If you have smoked or chewed Tobacco  in the last 2 yrs please stop smoking, stop any regular Alcohol  and or any Recreational drug use.  Wear Seat belts while driving.   Please note  You were cared for by a hospitalist during your hospital stay. If you have any questions about your discharge medications or the care you received while you were in the hospital after you are discharged, you can call the unit and asked to speak with the hospitalist on call if the hospitalist that took care of you is not available. Once you are discharged, your primary care physician will handle any further medical issues. Please note that NO REFILLS for any discharge medications will be authorized once you are discharged, as it is imperative that you return to your primary care physician (or establish a relationship with a primary care physician if you do not have one) for your aftercare needs so  that they can reassess your need for medications and monitor your lab values.     Increase activity slowly    Complete by:  As directed              Discharge Medications       Medication List    STOP taking these medications        sildenafil 100 MG tablet  Commonly known as:  VIAGRA     vancomycin 125 MG capsule  Commonly known as:  VANCOCIN      TAKE these medications        atorvastatin 40 MG tablet  Commonly known as:  LIPITOR  Take 40 mg by mouth daily.     b complex-vitamin c-folic acid 0.8 MG Tabs tablet  Take 1 tablet by mouth daily.     calcitRIOL 0.25 MCG capsule  Commonly known as:  ROCALTROL  Take 7 capsules (1.75 mcg total) by mouth every Monday, Wednesday, and Friday with hemodialysis.     clobetasol cream 0.05 %  Commonly known as:  TEMOVATE  Apply 1 application topically 2 (two) times daily.     colchicine 0.6 MG tablet  TAKE 1 TABLET (0.6 MG TOTAL) BY MOUTH 2 (TWO) TIMES DAILY.     diltiazem 120 MG 24 hr capsule  Commonly known as:  CARDIZEM CD  Take 1 capsule (120 mg total) by mouth daily.     enoxaparin 150 MG/ML injection  Commonly known as:  LOVENOX  Inject 0.55 mLs (85 mg total) into the skin daily. Stopped once INR reaches 2.     ethyl chloride spray  Apply 1 application topically every other day. Mon Wed Fri - done with dialysis     feeding supplement (NEPRO CARB STEADY) Liqd  Take 237 mLs by mouth 2 (two) times daily between meals.     fluocinonide ointment 0.05 %  Commonly known as:  LIDEX  Apply topically 4 (four) times daily.     hydrocerin Crea  Apply 1 application topically 4 (four) times daily.     insulin aspart 100 UNIT/ML injection  Commonly known as:  novoLOG  Before each meal 3  times a day, 140-199 - 2 units, 200-250 - 4 units, 251-299 - 6 units,  300-349 - 8 units,  350 or above 10 units.     Insulin Glargine 100 UNIT/ML Solostar Pen  Commonly known as:  LANTUS SOLOSTAR  Inject 20 Units into the skin 2 (two)  times daily.     metoprolol 50 MG tablet  Commonly known as:  LOPRESSOR  Take 1 tablet (50 mg total) by mouth 2 (two) times daily.     ondansetron 4 MG tablet  Commonly known as:  ZOFRAN  Take 4 mg by mouth 3 (three) times daily as needed for nausea or vomiting.     ONETOUCH VERIO test strip  Generic drug:  glucose blood     pantoprazole 20 MG tablet  Commonly known as:  PROTONIX  Take 1 tablet (20 mg total) by mouth 2 (two) times daily.     phenytoin 300 MG ER capsule  Commonly known as:  DILANTIN  Take 1 capsule (300 mg total) by mouth at bedtime.     saccharomyces boulardii 250 MG capsule  Commonly known as:  FLORASTOR  Take 1 capsule (250 mg total) by mouth 2 (two) times daily.     sevelamer carbonate 800 MG tablet  Commonly known as:  RENVELA  Take 2 tablets (1,600 mg total) by mouth 3 (three) times daily with meals.     warfarin 5 MG tablet  Commonly known as:  COUMADIN  Take 1 tablet (5 mg total) by mouth daily.        Major procedures and Radiology Reports - PLEASE review detailed and final reports for all details, in brief -   LP. Showing mildly increased protein levels  EEG - recorded two seizures which appear to arise from the left hemisphere as well as signs of irritability with potential for seizure origination in the right temporal region as well.     Ct Abdomen Pelvis Wo Contrast  11/27/2014   CLINICAL DATA:  59 year old male with C difficile, elevated white count, altered mental status/encephalitis and diabetic ketoacidosis. Elevated LFTs and end-stage renal disease.  EXAM: CT CHEST, ABDOMEN AND PELVIS WITHOUT CONTRAST  TECHNIQUE: Multidetector CT imaging of the chest, abdomen and pelvis was performed following the standard protocol without IV contrast.  COMPARISON:  11/18/2014 and prior chest radiographs. 11/06/2014 abdominal ultrasound.  FINDINGS: CT CHEST FINDINGS  Mediastinum/Nodes: Mild pulmonary and moderate-heavy coronary artery calcifications noted.  There is no evidence of thoracic aortic aneurysm or pericardial effusion. Shotty mediastinal lymph nodes are identified.  Lungs/Pleura: A small right pleural effusion is noted. Mild basilar atelectasis identified. Mild interstitial opacities within bilateral upper lobes noted, some with a tree-in-bud type appearance-question infection. There is no definite airspace disease, consolidation, suspicious nodule, mass or endobronchial/ endotracheal lesion.  Musculoskeletal: No acute or suspicious abnormalities.  CT ABDOMEN AND PELVIS FINDINGS  Please note that parenchymal abnormalities may be missed without intravenous contrast.  Hepatobiliary: The liver and gallbladder are unremarkable. There is no evidence of biliary dilatation.  Pancreas: Unremarkable  Spleen: Unremarkable  Adrenals/Urinary Tract: Mild bilateral renal atrophy noted. The adrenal glands and bladder are unremarkable.  Stomach/Bowel: There is no evidence of bowel obstruction or are definite bowel wall thickening. The appendix is normal.  Vascular/Lymphatic: No enlarged lymph nodes or abdominal aortic aneurysm. Mild aortic atherosclerotic calcifications noted.  Reproductive: Prostate unremarkable.  Other: A small amount of ascites within the abdomen and pelvis noted. There is no evidence of pneumoperitoneum. A small umbilical hernia containing  fat is identified. Mild subcutaneous edema is present.  Musculoskeletal: No acute or suspicious abnormalities.  IMPRESSION: Mild bilateral upper lobe interstitial opacities some with a tree-in-bud type appearance which may represent infection.  Small right pleural effusion, small amount of ascites and mild subcutaneous edema.  No evidence of bowel wall thickening.   Electronically Signed   By: Margarette Canada M.D.   On: 11/27/2014 15:22   Dg Chest 2 View  11/18/2014   CLINICAL DATA:  Altered mental status. Shortness of breath and nausea today.  EXAM: CHEST  2 VIEW  COMPARISON:  11/05/2014  FINDINGS: Chronic  cardiomegaly and vascular congestion, unchanged. Diminished right pleural effusion and basilar opacity from prior exam. No new confluent airspace disease or pneumothorax. No acute osseous abnormalities.  IMPRESSION: 1. Diminished right pleural effusion and right basilar opacity. 2. Stable chronic cardiomegaly and vascular congestion.   Electronically Signed   By: Jeb Levering M.D.   On: 11/18/2014 06:21   Dg Chest 2 View  11/05/2014   CLINICAL DATA:  Dyspnea for 1 day  EXAM: CHEST  2 VIEW  COMPARISON:  July 03, 2014  FINDINGS: The heart size and mediastinal contours are stable. There is mild patchy consolidation of lateral right lung base with small right pleural effusion. There is mild chronic central pulmonary vascular congestion. The visualized skeletal structures are stable.  IMPRESSION: Chronic central pulmonary vascular congestion.  Small right pleural effusion with associated mild patchy opacity of right lung base which could be due to atelectasis but superimposed pneumonia is not excluded.   Electronically Signed   By: Abelardo Diesel M.D.   On: 11/05/2014 21:54   Ct Head Wo Contrast  11/18/2014   CLINICAL DATA:  Altered mental status.  EXAM: CT HEAD WITHOUT CONTRAST  TECHNIQUE: Contiguous axial images were obtained from the base of the skull through the vertex without intravenous contrast.  COMPARISON:  None.  FINDINGS: No intracranial hemorrhage, mass effect, or midline shift. Small focal encephalomalacia in the left occipital lobe. No hydrocephalus. The basilar cisterns are patent. No evidence of territorial infarct. No intracranial fluid collection. Calvarium is intact. Included paranasal sinuses and mastoid air cells are well aerated.  IMPRESSION: 1.  No acute intracranial abnormality. 2. Small focal encephalomalacia in the left occipital lobe.   Electronically Signed   By: Jeb Levering M.D.   On: 11/18/2014 06:13   Ct Chest Wo Contrast  11/27/2014   CLINICAL DATA:  59 year old male with  C difficile, elevated white count, altered mental status/encephalitis and diabetic ketoacidosis. Elevated LFTs and end-stage renal disease.  EXAM: CT CHEST, ABDOMEN AND PELVIS WITHOUT CONTRAST  TECHNIQUE: Multidetector CT imaging of the chest, abdomen and pelvis was performed following the standard protocol without IV contrast.  COMPARISON:  11/18/2014 and prior chest radiographs. 11/06/2014 abdominal ultrasound.  FINDINGS: CT CHEST FINDINGS  Mediastinum/Nodes: Mild pulmonary and moderate-heavy coronary artery calcifications noted. There is no evidence of thoracic aortic aneurysm or pericardial effusion. Shotty mediastinal lymph nodes are identified.  Lungs/Pleura: A small right pleural effusion is noted. Mild basilar atelectasis identified. Mild interstitial opacities within bilateral upper lobes noted, some with a tree-in-bud type appearance-question infection. There is no definite airspace disease, consolidation, suspicious nodule, mass or endobronchial/ endotracheal lesion.  Musculoskeletal: No acute or suspicious abnormalities.  CT ABDOMEN AND PELVIS FINDINGS  Please note that parenchymal abnormalities may be missed without intravenous contrast.  Hepatobiliary: The liver and gallbladder are unremarkable. There is no evidence of biliary dilatation.  Pancreas: Unremarkable  Spleen:  Unremarkable  Adrenals/Urinary Tract: Mild bilateral renal atrophy noted. The adrenal glands and bladder are unremarkable.  Stomach/Bowel: There is no evidence of bowel obstruction or are definite bowel wall thickening. The appendix is normal.  Vascular/Lymphatic: No enlarged lymph nodes or abdominal aortic aneurysm. Mild aortic atherosclerotic calcifications noted.  Reproductive: Prostate unremarkable.  Other: A small amount of ascites within the abdomen and pelvis noted. There is no evidence of pneumoperitoneum. A small umbilical hernia containing fat is identified. Mild subcutaneous edema is present.  Musculoskeletal: No acute or  suspicious abnormalities.  IMPRESSION: Mild bilateral upper lobe interstitial opacities some with a tree-in-bud type appearance which may represent infection.  Small right pleural effusion, small amount of ascites and mild subcutaneous edema.  No evidence of bowel wall thickening.   Electronically Signed   By: Margarette Canada M.D.   On: 11/27/2014 15:22   Mr Brain Wo Contrast  11/26/2014   CLINICAL DATA:  Encephalitis.  Confusion.  EXAM: MRI HEAD WITHOUT CONTRAST  TECHNIQUE: Multiplanar, multiecho pulse sequences of the brain and surrounding structures were obtained without intravenous contrast.  COMPARISON:  MRI of the brain 11/21/2014.  FINDINGS: Restricted diffusion within the hippocampal structures bilaterally is somewhat less conspicuous than on the prior exam. Diffuse T2 signal remains in the medial temporal lobes. There is increased swelling in the left medial temporal lobe compared to the prior exam. The right hippocampus is similar to the prior exam.  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 Scattered periventricular and subcortical T2 changes are otherwise stable. No other new areas of signal abnormality are evident.  Flow was present in the major intracranial arteries. The globes orbits are intact. The paranasal sinuses are clear. There is some fluid in the inferior mastoid air cells. No obstructing nasopharyngeal lesion is present. Skullbase is within normal limits. Midline structures are unremarkable.  IMPRESSION: 1. Diffusion abnormality in the medial temporal lobes and hippocampal structures is improving. 2. Swelling and T2 signal is more prominent in the left medial temporal lobe and hippocampus. This may be related to seizure activity given the abnormal EEG and negative lumbar puncture. 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   Electronically Signed   By: San Morelle M.D.   On: 11/26/2014 17:14   Mr Brain Wo Contrast  11/21/2014   CLINICAL DATA:  Altered mental  status. Encephalopathy. Atrial fibrillation.  EXAM: MRI HEAD WITHOUT CONTRAST  TECHNIQUE: Multiplanar, multiecho pulse sequences of the brain and surrounding structures were obtained without intravenous contrast.  COMPARISON:  CT head without contrast from the same day.  FINDINGS: Restricted diffusion and increased T2 signal is present within the medial temporal lobes and hippocampal structures bilaterally. Inferior temporal lobes are intact.  Moderate generalized atrophy and diffuse periventricular T2 changes bilaterally are advanced for age. The ventricles are of normal size.  Flow is present in the major intracranial arteries. The globes and orbits are intact. The paranasal sinuses are clear. There is some fluid in the right mastoid air cells. No obstructing nasopharyngeal lesion is evident.  IMPRESSION: Abnormal T2 signal and restricted diffusion within the medial temporal lobes and hippocampus bilaterally. 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 There is no hemorrhage or signal change along the undersurface the temporal lobes, but herpes encephalitis is also considered. Status epilepticus is also on the differential diagnosis.  Atrophy in T2 signal changes are advanced for age. This is nonspecific, but likely reflects the sequela of chronic microvascular ischemia.  These results will be  called to the ordering clinician or representative by the Radiologist Assistant, and communication documented in the PACS or zVision Dashboard.   Electronically Signed   By: San Morelle M.D.   On: 11/21/2014 19:01   US Abdomen Complete  11/06/2014   CLINICAL DATA:  Abnormal liver function tests  EXAM: ULTRASOUND ABDOMEN COMPLETE  COMPARISON:  None.  FINDINGS: Gallbladder: No gallstones or wall thickening visualized. No sonographic Murphy sign noted.  Common bile duct: Diameter: 4 mm  Liver: Minimally nodular contour with no focal abnormalities.  IVC: No abnormality visualized.  Pancreas: Visualized  portion unremarkable.  Spleen: Size and appearance within normal limits.  Right Kidney: Length: 10.7 cm. Midpole cyst measuring 14 mm. Mildly increased echogenicity.  Left Kidney: Length: 10.8 cm. Lower pole cyst measures 1 cm. Mildly increased echogenicity.  Abdominal aorta: No aneurysm identified. Limited visualization distally due to bowel gas.  Other findings: Right pleural effusion.  Small volume ascites.  IMPRESSION: Mildly nodular liver contour with small volume of ascites and right pleural effusion. Possibility of cirrhosis not excluded.  Evidence of medical renal disease.   Electronically Signed   By: Skipper Cliche M.D.   On: 11/06/2014 21:38   Ir US Guide Vasc Access Right  11/26/2014   CLINICAL DATA:  Atrial fibrillation  EXAM: RIGHT JUGULAR TUNNELED PICC LINE PLACEMENT WITH ULTRASOUND AND FLUOROSCOPIC GUIDANCE  FLUOROSCOPY TIME:  18 seconds in  PROCEDURE: The patient was advised of the possible risks andcomplications and agreed to undergo the procedure. The patient was then brought to the angiographic suite for the procedure.  The right neck was prepped with chlorhexidine, drapedin the usual sterile fashion using maximum barrier technique (cap and mask, sterile gown, sterile gloves, large sterile sheet, hand hygiene and cutaneous antisepsis) and infiltrated locally with 1% Lidocaine.  Ultrasound demonstrated patency of the right internal jugular vein, and this was documented with an image. Under real-time ultrasound guidance, this vein was accessed with a 21 gauge micropuncture needle and image documentation was performed. A 0.018 wire was introduced in to the vein. Over this, a 5 [place] double lumen Power tunneled PICC was advanced to the lower SVC/right atrial junction. The cuff was positioned in the subcutaneous tract. Fluoroscopy during the procedure and fluoro spot radiograph confirms appropriate catheter position. The catheter was flushed and covered with asterile dressing.  Catheter length: 21  cm  COMPLICATIONS: None  IMPRESSION: Successful right jugular tunneled power PICC line placement with ultrasound and fluoroscopic guidance. The catheter is ready for use.   Electronically Signed   By: Marybelle Killings M.D.   On: 11/26/2014 16:15   Dg Chest Port 1 View  12/01/2014   CLINICAL DATA:  59 year old male with history of cough.  EXAM: PORTABLE CHEST - 1 VIEW  COMPARISON:  Chest x-ray 03/2015.  FINDINGS: There is a right-sided internal jugular central venous catheter with tip terminating in the superior cavoatrial junction. Lung volumes are slightly low. Diffuse interstitial prominence and peribronchial cuffing. No confluent consolidative airspace disease. Cephalization of the pulmonary vasculature. Mild cardiomegaly. The patient is rotated to the right on today's exam, resulting in distortion of the mediastinal contours and reduced diagnostic sensitivity and specificity for mediastinal pathology. Atherosclerosis in the thoracic aorta.  IMPRESSION: 1. Support apparatus, as above. 2. Overall, the appearance the chest suggests mild congestive heart failure. The possibility of concurrent bronchitis is not excluded, but is unlikely unless there is a history of fever and worsening purulent sputum production. 3. Previously described right lower lobe infiltrate  is less apparent, suggesting that this was atelectatic on the prior study from 11/30/2014.   Electronically Signed   By: Vinnie Langton M.D.   On: 12/01/2014 09:57   Dg Chest Port 1 View  11/30/2014   CLINICAL DATA:  Cough, shortness of breath, history hypertension, diabetes mellitus, CHF, chronic kidney disease, atrial fibrillation  EXAM: PORTABLE CHEST - 1 VIEW  COMPARISON:  Portable exam 1238 hours compared to 11/18/2014  FINDINGS: RIGHT jugular central venous catheter with tip projecting over SVC near cavoatrial junction.  Upper normal heart size.  Mediastinal contours and pulmonary vascularity normal.  Infiltrate RIGHT lower lobe.  Remaining lungs  clear.  No pleural effusion or pneumothorax.  IMPRESSION: RIGHT lower lobe infiltrate consistent with pneumonia.  No pneumothorax following central line placement.   Electronically Signed   By: Lavonia Dana M.D.   On: 11/30/2014 13:01   Ir Fluoro Guide Cv Midline Picc Right  11/26/2014   CLINICAL DATA:  Atrial fibrillation  EXAM: RIGHT JUGULAR TUNNELED PICC LINE PLACEMENT WITH ULTRASOUND AND FLUOROSCOPIC GUIDANCE  FLUOROSCOPY TIME:  18 seconds in  PROCEDURE: The patient was advised of the possible risks andcomplications and agreed to undergo the procedure. The patient was then brought to the angiographic suite for the procedure.  The right neck was prepped with chlorhexidine, drapedin the usual sterile fashion using maximum barrier technique (cap and mask, sterile gown, sterile gloves, large sterile sheet, hand hygiene and cutaneous antisepsis) and infiltrated locally with 1% Lidocaine.  Ultrasound demonstrated patency of the right internal jugular vein, and this was documented with an image. Under real-time ultrasound guidance, this vein was accessed with a 21 gauge micropuncture needle and image documentation was performed. A 0.018 wire was introduced in to the vein. Over this, a 5 [place] double lumen Power tunneled PICC was advanced to the lower SVC/right atrial junction. The cuff was positioned in the subcutaneous tract. Fluoroscopy during the procedure and fluoro spot radiograph confirms appropriate catheter position. The catheter was flushed and covered with asterile dressing.  Catheter length: 21 cm  COMPLICATIONS: None  IMPRESSION: Successful right jugular tunneled power PICC line placement with ultrasound and fluoroscopic guidance. The catheter is ready for use.   Electronically Signed   By: Marybelle Killings M.D.   On: 11/26/2014 16:15    Micro Results      Recent Results (from the past 240 hour(s))  CSF culture with Stat gram stain     Status: None   Collection Time: 11/23/14  4:30 PM  Result Value  Ref Range Status   Specimen Description CSF  Final   Special Requests NONE  Final   Gram Stain   Final    WBC PRESENT, PREDOMINANTLY MONONUCLEAR NO ORGANISMS SEEN CYTOSPIN    Culture NO GROWTH 3 DAYS  Final   Report Status 11/27/2014 FINAL  Final       Today   Subjective    Tawny Hopping today has no headache,no chest abdominal pain,no new weakness tingling or numbness, feels much better.    Objective   Blood pressure 130/59, pulse 97, temperature 99.1 F (37.3 C), temperature source Oral, resp. rate 16, height 6' (1.829 m), weight 82.237 kg (181 lb 4.8 oz), SpO2 99 %.   Intake/Output Summary (Last 24 hours) at 12/02/14 1039 Last data filed at 12/02/14 0900  Gross per 24 hour  Intake    440 ml  Output   3000 ml  Net  -2560 ml    Exam Awake , Oriented x  1, No new F.N deficits, Normal affect Cameron.AT,PERRAL Supple Neck,No JVD, No cervical lymphadenopathy appriciated.  Symmetrical Chest wall movement, Good air movement bilaterally, CTAB RRR,No Gallops,Rubs or new Murmurs, No Parasternal Heave +ve B.Sounds, Abd Soft, Non tender, No organomegaly appriciated, No rebound -guarding or rigidity. No Cyanosis, Clubbing or edema, No new Rash or bruise, Old generalized rash mostly over the back with excoriations      Data Review   CBC w Diff:  Lab Results  Component Value Date   WBC 15.6* 12/01/2014   HGB 9.8* 12/01/2014   HCT 30.1* 12/01/2014   PLT 122* 12/01/2014   LYMPHOPCT 4* 12/01/2014   MONOPCT 3 12/01/2014   EOSPCT 1 12/01/2014   BASOPCT 0 12/01/2014    CMP:  Lab Results  Component Value Date   NA 134* 12/01/2014   K 3.6 12/01/2014   CL 98* 12/01/2014   CO2 27 12/01/2014   BUN 47* 12/01/2014   CREATININE 3.83* 12/01/2014   CREATININE 4.88* 10/17/2013   PROT 4.8* 11/30/2014   ALBUMIN 2.4* 12/01/2014   BILITOT 1.2 11/30/2014   ALKPHOS 147* 11/30/2014   AST 34 11/30/2014   ALT 69* 11/30/2014  . Lab Results  Component Value Date   INR 1.43 12/02/2014     INR 1.44 12/01/2014   INR 1.49 11/30/2014    Lab Results  Component Value Date   HGBA1C 9.4* 11/07/2014     Total Time in preparing paper work, data evaluation and todays exam - 35 minutes  Thurnell Lose M.D on 12/02/2014 at 10:39 AM  [facility]   Office  [phone]

## 2014-12-02 NOTE — Progress Notes (Signed)
Patient ID: Joseph [name], male   DOB: 1955/10/03, 59 y.o.   MRN: [id number]  Right IJ tunneled central line removed at bedside without difficulty in anticipation of discharge from hospital.  Dressing applied to exit site.

## 2014-12-02 NOTE — Progress Notes (Signed)
Gave report to [facility] who voiced concern about patient's elevated WBC and previous chest x-ray that showed pneumonia.  Was asked if pt will be discharged on antibiotics.  Spoke with Dr. Candiss Norse about Latonia's concern and was told that the pneumonia was improving and that antibiotics would not be started due to patient's previous infection with c.diff.  Informed Latonia.  No new orders at this time.  Pt's CVC removed and EMS to transport pt to [facility].

## 2014-12-02 NOTE — Progress Notes (Signed)
[facility] Progress Note  Assessment/Plan: 1. AMS - some better. 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 Have d/w neurology, pt may not get back to baseline, suspects underlying dementia /liver failure/ etoh history along w renal failure all probably contributing to AMS. Oriented to person and place. Poor recall of recent events. 2. Volume excess/ hyponatremia - HD yesterday. Net UF 3000. Post wt 80.3 .  3. C. Diff colitis- has finished ABX. No diarrhea.  4. DKA / DM type 1 - resolved 5. Seizures - on po dilantin 6. ESRD HD MWF at dry wt but needs lower dry wt 7. Enterococcal UTI- s/p 1 dose fosfomycin on 8/2 8. 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 Abnormal LFT's-? Etiology. Trending down. No abd pain. [place] 11/06/14- mild nodular liver contour w mall volume of ascitis. Possibility of cirrhosis not excluded.Ammonia level 44 on 8/2 11. Nutrition:per primary   12  A fib- on dilt/ MTP. Had been started on lovenox/coumadin per pharmacy.   Disposition:  Plan to DC today to SNF. Will have HD in home unit tomorrow.   Rita H. Brown NP-C 12/02/2014, 10:32 AM  [facility] [phone]  Pt seen, examined and agree w A/P as above.  Kelly Splinter MD pager [phone]    cell [phone] 12/02/2014, 3:17 PM    Subjective:     Objective Filed Vitals:   12/02/14 0142 12/02/14 0522 12/02/14 0538 12/02/14 0945  BP: 105/71  130/59   Pulse: 95  97   Temp:   100.1 F (37.8 C) 99.1 F (37.3 C)  TempSrc:   Oral Oral  Resp:   16   Height:      Weight:  82.237 kg (181 lb 4.8 oz)    SpO2:   99%    Physical Exam General: Chronically ill appearing AA male in NAD Heart: Irregular, S1,S2.  Lungs: Bilateral breath sounds, slightly decreased in bases, essentially clear Abd active BS, nontender Extremities: No UE/LE edema,  Dialysis Access: LFA AVG  +Bruit  [facility] MWF  80kg 3/2.5 bath P2 AVG L arm Heparin 6100  Calcitriol 1.75 (no Fe/ esa) Last tsat 42%, ferr 1122, pth 427, Hb 11.6 Additional Objective Labs: Basic Metabolic Panel:  Recent Labs Lab 11/26/14 0951 11/28/14 1641 12/01/14 0856  NA 125* 126* 134*  K 4.3 4.7 3.6  CL 90* 93* 98*  CO2 22 21* 27  GLUCOSE 146* 322* 78  BUN 82* 89* 47*  CREATININE 5.00* 5.02* 3.83*  CALCIUM 8.0* 8.0* 8.1*  PHOS 5.0* 5.8* 3.5   Liver Function Tests:  Recent Labs Lab 11/28/14 1641 11/30/14 1300 12/01/14 0856  AST  --  34  --   ALT  --  69*  --   ALKPHOS  --  147*  --   BILITOT  --  1.2  --   PROT  --  4.8*  --   ALBUMIN 2.6* 2.4* 2.4*   No results for input(s): LIPASE, AMYLASE in the last 168 hours. CBC:  Recent Labs Lab 11/28/14 2112 11/29/14 0558 11/30/14 0420 12/01/14 0505 12/01/14 0900  WBC 14.0* 13.2* 18.0* 15.0* 15.6*  NEUTROABS  --   --   --   --  14.3*  HGB 10.9* 10.5* 11.3* 9.8* 9.8*  HCT 31.8* 31.2* 34.4* 29.8* 30.1*  MCV 91.6 94.3 93.5 94.0 94.4  PLT 117* 115* 153 117* 122*   Blood  Culture    Component Value Date/Time   SDES CSF 11/23/2014 1630   SPECREQUEST NONE 11/23/2014 1630   CULT NO GROWTH 3 DAYS 11/23/2014 1630   REPTSTATUS 11/27/2014 FINAL 11/23/2014 1630    Cardiac Enzymes: No results for input(s): CKTOTAL, CKMB, CKMBINDEX, TROPONINI in the last 168 hours. CBG:  Recent Labs Lab 12/01/14 1748 12/01/14 2206 12/02/14 0824 12/02/14 0839 12/02/14 0943  GLUCAP 96 115* 38* 56* 83   Iron Studies: No results for input(s): IRON, TIBC, TRANSFERRIN, FERRITIN in the last 72 hours. @lablastinr3 @ Studies/Results: Dg Chest Port 1 View  12/01/2014   CLINICAL DATA:  59 year old male with history of cough.  EXAM: PORTABLE CHEST - 1 VIEW  COMPARISON:  Chest x-ray 03/2015.  FINDINGS: There is a right-sided internal jugular central venous catheter with tip terminating in the superior cavoatrial junction. Lung volumes are slightly low. Diffuse interstitial  prominence and peribronchial cuffing. No confluent consolidative airspace disease. Cephalization of the pulmonary vasculature. Mild cardiomegaly. The patient is rotated to the right on today's exam, resulting in distortion of the mediastinal contours and reduced diagnostic sensitivity and specificity for mediastinal pathology. Atherosclerosis in the thoracic aorta.  IMPRESSION: 1. Support apparatus, as above. 2. Overall, the appearance the chest suggests mild congestive heart failure. The possibility of concurrent bronchitis is not excluded, but is unlikely unless there is a history of fever and worsening purulent sputum production. 3. Previously described right lower lobe infiltrate is less apparent, suggesting that this was atelectatic on the prior study from 11/30/2014.   Electronically Signed   By: Vinnie Langton M.D.   On: 12/01/2014 09:57   Dg Chest Port 1 View  11/30/2014   CLINICAL DATA:  Cough, shortness of breath, history hypertension, diabetes mellitus, CHF, chronic kidney disease, atrial fibrillation  EXAM: PORTABLE CHEST - 1 VIEW  COMPARISON:  Portable exam 1238 hours compared to 11/18/2014  FINDINGS: RIGHT jugular central venous catheter with tip projecting over SVC near cavoatrial junction.  Upper normal heart size.  Mediastinal contours and pulmonary vascularity normal.  Infiltrate RIGHT lower lobe.  Remaining lungs clear.  No pleural effusion or pneumothorax.  IMPRESSION: RIGHT lower lobe infiltrate consistent with pneumonia.  No pneumothorax following central line placement.   Electronically Signed   By: Lavonia Dana M.D.   On: 11/30/2014 13:01   Medications: . sodium chloride 10 mL/hr at 11/18/14 2300  . sodium chloride 10 mL/hr at 11/27/14 0541   . antiseptic oral rinse  7 mL Mouth Rinse 6 times per day  . calcitRIOL  1.75 mcg Oral Q M,W,F-HD  . clobetasol cream  1 application Topical TID  . colchicine  0.3 mg Oral Once per day on Mon Thu  . diltiazem  30 mg Oral 4 times per day  .  enoxaparin (LOVENOX) injection  1 mg/kg Subcutaneous Q24H  . fluocinonide ointment   Topical QID  . hydrocerin   Topical QID  . insulin aspart  0-15 Units Subcutaneous TID WC  . insulin aspart  0-5 Units Subcutaneous QHS  . insulin glargine  25 Units Subcutaneous BID  . LORazepam  1 mg Intravenous Once  . metoprolol  25 mg Oral BID  . multivitamin  1 tablet Oral QHS  . pantoprazole  20 mg Oral BID  . phenytoin  300 mg Oral QHS  . saccharomyces boulardii  250 mg Oral BID  . sevelamer carbonate  1,600 mg Oral TID WC  . warfarin  7.5 mg Oral ONCE-1800  . Warfarin - Pharmacist Dosing  Inpatient   Does not apply q1800

## 2014-12-02 NOTE — Progress Notes (Signed)
ANTICOAGULATION CONSULT NOTE - Follow Up Consult  Pharmacy Consult for Heparin to Lovenox and Warfarin Indication: Afib  Allergies  Allergen Reactions  . Dilaudid [Hydromorphone] Nausea And Vomiting    Patient Measurements: Height: 6' (182.9 cm) Weight: 181 lb 4.8 oz (82.237 kg) IBW/kg (Calculated) : 77.6  Vital Signs: Temp: 100.1 F (37.8 C) (08/14 0538) Temp Source: Oral (08/14 0538) BP: 130/59 mmHg (08/14 0538) Pulse Rate: 97 (08/14 0538)  Labs:  Recent Labs  11/30/14 0420 11/30/14 1300 12/01/14 0505 12/01/14 0856 12/01/14 0900 12/01/14 2220 12/02/14 0539  HGB 11.3*  --  9.8*  --  9.8*  --   --   HCT 34.4*  --  29.8*  --  30.1*  --   --   PLT 153  --  117*  --  122*  --   --   LABPROT 18.1*  --  17.6*  --   --   --  17.6*  INR 1.49  --  1.44  --   --   --  1.43  HEPARINUNFRC  --  0.50 0.14*  --   --  0.69  --   CREATININE  --   --   --  3.83*  --   --   --     Estimated Creatinine Clearance: 22.8 mL/min (by C-G formula based on Cr of 3.83).   Assessment: 59 year old male on heparin to bridge warfarin for hx Afib. Heparin level is now therapeutic at 0.69 but has risen significantly and is on upper end of goal range. Switch from Heparin to Lovenox to complete bridge to warfarin in order for patient to be discharged.   INR is subtherapeutic at 1.43. No bleeding noted. PTA coumadin dose 5 mg daily. RN reports no problems with infusion.  Goal of Therapy:  INR 2 - 3 Monitor platelets by anticoagulation protocol: Yes   Plan:  D/c heparin and initiate Lovenox 1 hour after discontinuation of heparin Lovenox at 1 mg/kg daily (renally adjusted) - dose: 80 mg daily Warfarin 7.5 mg daily*1 f/u outpatient for proper dosage plan Daily HL/CBC/INR Monitor s/sx of bleeding   Melburn Popper, PharmD Clinical Pharmacy Resident Pager: [phone] 12/02/2014 10:08 AM

## 2014-12-02 NOTE — Telephone Encounter (Signed)
Will need to see him in the office.  If PA pressure is that high on echo, likely needs right heart cath.

## 2014-12-03 ENCOUNTER — Telehealth: Payer: Self-pay | Admitting: *Deleted

## 2014-12-03 NOTE — Telephone Encounter (Signed)
Noted, thank you

## 2014-12-03 NOTE — Telephone Encounter (Signed)
Patient's wife returned call.  She states she has made Joseph [name] a follow up at [place], where he will be transferring care,

## 2014-12-03 NOTE — Telephone Encounter (Signed)
Transitional care call attempted.  Left message for patient to return call. 

## 2014-12-04 LAB — MISCELLANEOUS TEST

## 2014-12-06 LAB — B. BURGDORFI ANTIBODIES, CSF
Albumin CSF: 22.1 mg/dL (ref 10.4–43.2)
B. burgdorferi IgM CSF: 0.07
IGG TOTAL CSF: 7.08 mg/dL — AB (ref 0.00–3.06)
IGG TOTAL SERUM: 0 mg/dL — AB (ref 540–1423)
IGM TOTAL CSF: 0.4 mg/dL (ref 0.0–0.6)

## 2014-12-06 LAB — MISCELLANEOUS TEST

## 2014-12-09 ENCOUNTER — Encounter (HOSPITAL_COMMUNITY): Payer: Self-pay | Admitting: *Deleted

## 2014-12-09 ENCOUNTER — Emergency Department (HOSPITAL_COMMUNITY): Payer: 59

## 2014-12-09 ENCOUNTER — Inpatient Hospital Stay (HOSPITAL_COMMUNITY)
Admission: EM | Admit: 2014-12-09 | Discharge: 2014-12-26 | DRG: 871 | Disposition: A | Discharge status: Rehabilitation Facility | Payer: 59 | Attending: Internal Medicine | Admitting: Internal Medicine

## 2014-12-09 DIAGNOSIS — Z833 Family history of diabetes mellitus: Secondary | ICD-10-CM

## 2014-12-09 DIAGNOSIS — R5381 Other malaise: Secondary | ICD-10-CM | POA: Insufficient documentation

## 2014-12-09 DIAGNOSIS — Z8249 Family history of ischemic heart disease and other diseases of the circulatory system: Secondary | ICD-10-CM

## 2014-12-09 DIAGNOSIS — I120 Hypertensive chronic kidney disease with stage 5 chronic kidney disease or end stage renal disease: Secondary | ICD-10-CM | POA: Diagnosis present

## 2014-12-09 DIAGNOSIS — Z87891 Personal history of nicotine dependence: Secondary | ICD-10-CM | POA: Diagnosis not present

## 2014-12-09 DIAGNOSIS — T45515A Adverse effect of anticoagulants, initial encounter: Secondary | ICD-10-CM | POA: Diagnosis not present

## 2014-12-09 DIAGNOSIS — Z794 Long term (current) use of insulin: Secondary | ICD-10-CM | POA: Diagnosis not present

## 2014-12-09 DIAGNOSIS — I480 Paroxysmal atrial fibrillation: Secondary | ICD-10-CM | POA: Diagnosis not present

## 2014-12-09 DIAGNOSIS — Z885 Allergy status to narcotic agent status: Secondary | ICD-10-CM | POA: Diagnosis not present

## 2014-12-09 DIAGNOSIS — J9601 Acute respiratory failure with hypoxia: Secondary | ICD-10-CM | POA: Diagnosis present

## 2014-12-09 DIAGNOSIS — M109 Gout, unspecified: Secondary | ICD-10-CM | POA: Diagnosis present

## 2014-12-09 DIAGNOSIS — N186 End stage renal disease: Secondary | ICD-10-CM | POA: Diagnosis not present

## 2014-12-09 DIAGNOSIS — I482 Chronic atrial fibrillation: Secondary | ICD-10-CM | POA: Diagnosis present

## 2014-12-09 DIAGNOSIS — R197 Diarrhea, unspecified: Secondary | ICD-10-CM | POA: Diagnosis present

## 2014-12-09 DIAGNOSIS — K920 Hematemesis: Secondary | ICD-10-CM

## 2014-12-09 DIAGNOSIS — Z7901 Long term (current) use of anticoagulants: Secondary | ICD-10-CM | POA: Diagnosis not present

## 2014-12-09 DIAGNOSIS — Y95 Nosocomial condition: Secondary | ICD-10-CM | POA: Diagnosis present

## 2014-12-09 DIAGNOSIS — Z992 Dependence on renal dialysis: Secondary | ICD-10-CM | POA: Diagnosis not present

## 2014-12-09 DIAGNOSIS — Z881 Allergy status to other antibiotic agents status: Secondary | ICD-10-CM

## 2014-12-09 DIAGNOSIS — Z79899 Other long term (current) drug therapy: Secondary | ICD-10-CM | POA: Diagnosis not present

## 2014-12-09 DIAGNOSIS — I272 Other secondary pulmonary hypertension: Secondary | ICD-10-CM | POA: Diagnosis present

## 2014-12-09 DIAGNOSIS — D72829 Elevated white blood cell count, unspecified: Secondary | ICD-10-CM | POA: Diagnosis not present

## 2014-12-09 DIAGNOSIS — J189 Pneumonia, unspecified organism: Secondary | ICD-10-CM

## 2014-12-09 DIAGNOSIS — N2581 Secondary hyperparathyroidism of renal origin: Secondary | ICD-10-CM | POA: Diagnosis present

## 2014-12-09 DIAGNOSIS — I4891 Unspecified atrial fibrillation: Secondary | ICD-10-CM | POA: Diagnosis not present

## 2014-12-09 DIAGNOSIS — G9340 Encephalopathy, unspecified: Secondary | ICD-10-CM | POA: Diagnosis not present

## 2014-12-09 DIAGNOSIS — I5032 Chronic diastolic (congestive) heart failure: Secondary | ICD-10-CM | POA: Diagnosis present

## 2014-12-09 DIAGNOSIS — E1142 Type 2 diabetes mellitus with diabetic polyneuropathy: Secondary | ICD-10-CM | POA: Diagnosis present

## 2014-12-09 DIAGNOSIS — G0481 Other encephalitis and encephalomyelitis: Secondary | ICD-10-CM | POA: Diagnosis not present

## 2014-12-09 DIAGNOSIS — G4733 Obstructive sleep apnea (adult) (pediatric): Secondary | ICD-10-CM | POA: Diagnosis present

## 2014-12-09 DIAGNOSIS — I481 Persistent atrial fibrillation: Secondary | ICD-10-CM | POA: Diagnosis not present

## 2014-12-09 DIAGNOSIS — G0490 Encephalitis and encephalomyelitis, unspecified: Secondary | ICD-10-CM | POA: Diagnosis present

## 2014-12-09 DIAGNOSIS — A419 Sepsis, unspecified organism: Principal | ICD-10-CM | POA: Diagnosis present

## 2014-12-09 DIAGNOSIS — E1122 Type 2 diabetes mellitus with diabetic chronic kidney disease: Secondary | ICD-10-CM | POA: Diagnosis present

## 2014-12-09 DIAGNOSIS — R112 Nausea with vomiting, unspecified: Secondary | ICD-10-CM

## 2014-12-09 DIAGNOSIS — R042 Hemoptysis: Secondary | ICD-10-CM | POA: Diagnosis not present

## 2014-12-09 DIAGNOSIS — C801 Malignant (primary) neoplasm, unspecified: Secondary | ICD-10-CM

## 2014-12-09 DIAGNOSIS — D631 Anemia in chronic kidney disease: Secondary | ICD-10-CM | POA: Diagnosis present

## 2014-12-09 DIAGNOSIS — I10 Essential (primary) hypertension: Secondary | ICD-10-CM | POA: Diagnosis not present

## 2014-12-09 LAB — COMPREHENSIVE METABOLIC PANEL
ALT: 55 U/L (ref 17–63)
AST: 70 U/L — ABNORMAL HIGH (ref 15–41)
Albumin: 1.9 g/dL — ABNORMAL LOW (ref 3.5–5.0)
Alkaline Phosphatase: 280 U/L — ABNORMAL HIGH (ref 38–126)
Anion gap: 13 (ref 5–15)
BUN: 35 mg/dL — ABNORMAL HIGH (ref 6–20)
CO2: 25 mmol/L (ref 22–32)
Calcium: 8.1 mg/dL — ABNORMAL LOW (ref 8.9–10.3)
Chloride: 95 mmol/L — ABNORMAL LOW (ref 101–111)
Creatinine, Ser: 6.16 mg/dL — ABNORMAL HIGH (ref 0.61–1.24)
GFR calc Af Amer: 10 mL/min — ABNORMAL LOW (ref >60)
GFR calc non Af Amer: 9 mL/min — ABNORMAL LOW (ref >60)
Glucose, Bld: 69 mg/dL (ref 65–99)
Potassium: 3.7 mmol/L (ref 3.5–5.1)
Sodium: 133 mmol/L — ABNORMAL LOW (ref 135–145)
Total Bilirubin: 1.3 mg/dL — ABNORMAL HIGH (ref 0.3–1.2)
Total Protein: 5.7 g/dL — ABNORMAL LOW (ref 6.5–8.1)

## 2014-12-09 LAB — CBC WITH DIFFERENTIAL/PLATELET
Basophils Absolute: 0 10*3/uL (ref 0.0–0.1)
Basophils Relative: 0 % (ref 0–1)
Eosinophils Absolute: 0.1 10*3/uL (ref 0.0–0.7)
Eosinophils Relative: 1 % (ref 0–5)
HCT: 26.4 % — ABNORMAL LOW (ref 39.0–52.0)
Hemoglobin: 8.6 g/dL — ABNORMAL LOW (ref 13.0–17.0)
Lymphocytes Relative: 9 % — ABNORMAL LOW (ref 12–46)
Lymphs Abs: 1.3 10*3/uL (ref 0.7–4.0)
MCH: 31.4 pg (ref 26.0–34.0)
MCHC: 32.6 g/dL (ref 30.0–36.0)
MCV: 96.4 fL (ref 78.0–100.0)
Monocytes Absolute: 0.7 10*3/uL (ref 0.1–1.0)
Monocytes Relative: 5 % (ref 3–12)
Neutro Abs: 12.7 10*3/uL — ABNORMAL HIGH (ref 1.7–7.7)
Neutrophils Relative %: 85 % — ABNORMAL HIGH (ref 43–77)
Platelets: 157 10*3/uL (ref 150–400)
RBC: 2.74 MIL/uL — ABNORMAL LOW (ref 4.22–5.81)
RDW: 18.6 % — ABNORMAL HIGH (ref 11.5–15.5)
WBC: 14.8 10*3/uL — ABNORMAL HIGH (ref 4.0–10.5)

## 2014-12-09 LAB — I-STAT CG4 LACTIC ACID, ED: Lactic Acid, Venous: 1.95 mmol/L (ref 0.5–2.0)

## 2014-12-09 LAB — PROTIME-INR
INR: 2.93 — AB (ref 0.00–1.49)
Prothrombin Time: 30.1 seconds — ABNORMAL HIGH (ref 11.6–15.2)

## 2014-12-09 MED ORDER — PIPERACILLIN-TAZOBACTAM IN DEX 2-0.25 GM/50ML IV SOLN
2.2500 g | Freq: Three times a day (TID) | INTRAVENOUS | Status: DC
  Administered 2014-12-10 – 2014-12-15 (×14): 2.25 g via INTRAVENOUS
  Filled 2014-12-09 (×19): qty 50

## 2014-12-09 MED ORDER — PIPERACILLIN-TAZOBACTAM 3.375 G IVPB 30 MIN
3.3750 g | Freq: Once | INTRAVENOUS | Status: AC
Start: 2014-12-09 — End: 2014-12-09
  Administered 2014-12-09: 3.375 g via INTRAVENOUS
  Filled 2014-12-09: qty 50

## 2014-12-09 MED ORDER — VANCOMYCIN HCL IN DEXTROSE 1-5 GM/200ML-% IV SOLN: 1000.0000 mg | Freq: Once | INTRAVENOUS | Status: DC

## 2014-12-09 MED ORDER — SODIUM CHLORIDE 0.9 % IV BOLUS (SEPSIS)
1000.0000 mL | Freq: Once | INTRAVENOUS | Status: AC
Start: 2014-12-09 — End: 2014-12-10
  Administered 2014-12-09: 1000 mL via INTRAVENOUS

## 2014-12-09 MED ORDER — VANCOMYCIN HCL 10 G IV SOLR
1750.0000 mg | INTRAVENOUS | Status: AC
  Administered 2014-12-09: 1750 mg via INTRAVENOUS
  Filled 2014-12-09: qty 1750

## 2014-12-09 NOTE — ED Provider Notes (Signed)
CSN: [id number]     Arrival date & time 12/09/14  1946 History   First MD Initiated Contact with Patient 12/09/14 1951     Chief Complaint  Patient presents with  . Code Sepsis     (Consider location/radiation/quality/duration/timing/severity/associated sxs/prior Treatment) HPI Patient presents to the emergency department with altered mental status, fever from a nursing facility.  Patient was sent to the nursing facility Sunday and was diagnosed with pneumonia.  Upon discharge.  The weight is unsure if he had any anabiotic's from his discharge.  The patient has been getting steadily worse over time was sent in tonight by the nursing home staff is found to be hypotensive and confused.  The patient is unable to give me any history.  There is no reports of vomiting, diarrhea Past Medical History  Diagnosis Date  . Atrial fibrillation     a. 11/18/2011 s/p DCCV - 150J;  b. Anticoagulation w/ Apixaban;  c. 11/2011 back in afib->asymptomatic.  . H/O alcohol abuse     a. drinks heavily on the weekends.  . Hypertension   . Diabetes mellitus   . Heart murmur     a. 09/2011 Echo: EF 55-60%, Triv AI, Mild MR, mildly dil LA.  . Diabetic peripheral neuropathy   . CKD (chronic kidney disease), stage III   . CHF (congestive heart failure)   . Pneumonia   . Sleep apnea    Past Surgical History  Procedure Laterality Date  . Eye sx  11/11/2011    left eye  . Cardioversion  11/18/2011    Procedure: CARDIOVERSION;  Surgeon: Josue Hector, MD;  Location: [facility] ENDOSCOPY;  Service: Cardiovascular;  Laterality: N/A;  . Colonoscopy w/ biopsies and polypectomy    . Av fistula placement Left 02/13/2014    Procedure: INSERTION OF ARTERIOVENOUS (AV) GORE-TEX GRAFT ARM;  Surgeon: Angelia Mould, MD;  Location: [place];  Service: Vascular;  Laterality: Left;  . Right heart catheterization N/A 10/11/2013    Procedure: RIGHT HEART CATH;  Surgeon: Larey Dresser, MD;  Location: [facility] CATH LAB;  Service:  Cardiovascular;  Laterality: N/A;   Family History  Problem Relation Age of Onset  . Heart disease Mother     before age 32  . Diabetes Father   . Diabetes Sister   . Peripheral vascular disease Sister     amputation  . Cancer Neg Hx   . Stroke Neg Hx    Social History  Substance Use Topics  . Smoking status: Former Smoker -- 0.01 packs/day for 5 years    Types: Cigarettes    Quit date: 04/20/1994  . Smokeless tobacco: Never Used     Comment: some in college  . Alcohol Use: No    Review of Systems  Level V caveat applies due to altered mental status  Allergies  Keflex and Dilaudid  Home Medications   Prior to Admission medications   Medication Sig Start Date End Date Taking? Authorizing Provider  atorvastatin (LIPITOR) 40 MG tablet Take 40 mg by mouth at bedtime.    Yes Historical Provider, MD  calcitRIOL (ROCALTROL) 0.25 MCG capsule Take 7 capsules (1.75 mcg total) by mouth every Monday, Wednesday, and Friday with hemodialysis. 12/02/14  Yes Thurnell Lose, MD  colchicine 0.6 MG tablet TAKE 1 TABLET (0.6 MG TOTAL) BY MOUTH 2 (TWO) TIMES DAILY. 08/14/14  Yes Jearld Fenton, NP  diltiazem (CARDIZEM CD) 120 MG 24 hr capsule Take 1 capsule (120 mg total) by mouth daily.  03/03/14  Yes Donne Hazel, MD  ethyl chloride spray Apply 1 application topically every other day. Mon Wed Fri - done with dialysis 05/12/14  Yes Historical Provider, MD  fluocinonide ointment (LIDEX) 0.05 % Apply topically 4 (four) times daily. 12/02/14  Yes Thurnell Lose, MD  hydrocerin (EUCERIN) CREA Apply 1 application topically 4 (four) times daily. 12/02/14  Yes Thurnell Lose, MD  insulin aspart (NOVOLOG) 100 UNIT/ML injection Before each meal 3 times a day, 140-199 - 2 units, 200-250 - 4 units, 251-299 - 6 units,  300-349 - 8 units,  350 or above 10 units. 12/02/14  Yes Thurnell Lose, MD  Insulin Glargine (LANTUS SOLOSTAR) 100 UNIT/ML Solostar Pen Inject 20 Units into the skin 2 (two) times daily.  12/02/14  Yes Thurnell Lose, MD  levocetirizine (XYZAL) 5 MG tablet Take 5 mg by mouth every evening.   Yes Historical Provider, MD  metoprolol tartrate (LOPRESSOR) 50 MG tablet Take 1 tablet (50 mg total) by mouth 2 (two) times daily. 12/02/14  Yes Thurnell Lose, MD  Nutritional Supplements (FEEDING SUPPLEMENT, NEPRO CARB STEADY,) LIQD Take 237 mLs by mouth 2 (two) times daily between meals. 11/09/14  Yes Kelvin Cellar, MD  ondansetron (ZOFRAN) 4 MG tablet Take 4 mg by mouth 3 (three) times daily as needed for nausea or vomiting.   Yes Historical Provider, MD  pantoprazole (PROTONIX) 20 MG tablet Take 1 tablet (20 mg total) by mouth 2 (two) times daily. 12/02/14  Yes Thurnell Lose, MD  phenytoin (DILANTIN) 300 MG ER capsule Take 1 capsule (300 mg total) by mouth at bedtime. Patient taking differently: Take 300 mg by mouth every morning.  12/02/14  Yes Thurnell Lose, MD  saccharomyces boulardii (FLORASTOR) 250 MG capsule Take 1 capsule (250 mg total) by mouth 2 (two) times daily. Patient taking differently: Take 250 mg by mouth daily.  11/09/14  Yes Kelvin Cellar, MD  sevelamer carbonate (RENVELA) 800 MG tablet Take 2 tablets (1,600 mg total) by mouth 3 (three) times daily with meals. 12/02/14  Yes Thurnell Lose, MD  warfarin (COUMADIN) 5 MG tablet Take 1 tablet (5 mg total) by mouth daily. 03/03/14  Yes Donne Hazel, MD  enoxaparin (LOVENOX) 150 MG/ML injection Inject 0.55 mLs (85 mg total) into the skin daily. Stopped once INR reaches 2. 12/02/14   Thurnell Lose, MD   BP 98/54 mmHg  Pulse 99  Temp(Src) 103.4 F (39.7 C) (Rectal)  Resp 30  Ht 6\' 2"  (1.88 m)  Wt 181 lb (82.101 kg)  BMI 23.23 kg/m2  SpO2 95% Physical Exam  Constitutional: He appears well-developed and well-nourished. No distress.  HENT:  Head: Normocephalic and atraumatic.  Mouth/Throat: Oropharynx is clear and moist.  Eyes: Pupils are equal, round, and reactive to light.  Neck: Normal range of motion.  Neck supple.  Cardiovascular: Normal rate, regular rhythm and normal heart sounds.  Exam reveals no gallop and no friction rub.   No murmur heard. Pulmonary/Chest: Effort normal. Tachypnea noted. He has decreased breath sounds in the right lower field and the left lower field. He has no wheezes. He has rhonchi in the right middle field, the right lower field, the left middle field and the left lower field. He has no rales.  Musculoskeletal: He exhibits edema.  Neurological: He is alert. He exhibits normal muscle tone. Coordination normal.  Skin: Skin is warm and dry. Rash noted. No erythema.  Nursing note and vitals reviewed.  ED Course  Procedures (including critical care time) Labs Review Labs Reviewed  COMPREHENSIVE METABOLIC PANEL - Abnormal; Notable for the following:    Sodium 133 (*)    Chloride 95 (*)    BUN 35 (*)    Creatinine, Ser 6.16 (*)    Calcium 8.1 (*)    Total Protein 5.7 (*)    Albumin 1.9 (*)    AST 70 (*)    Alkaline Phosphatase 280 (*)    Total Bilirubin 1.3 (*)    GFR calc non Af Amer 9 (*)    GFR calc Af Amer 10 (*)    All other components within normal limits  CBC WITH DIFFERENTIAL/PLATELET - Abnormal; Notable for the following:    WBC 14.8 (*)    RBC 2.74 (*)    Hemoglobin 8.6 (*)    HCT 26.4 (*)    RDW 18.6 (*)    Neutrophils Relative % 85 (*)    Lymphocytes Relative 9 (*)    Neutro Abs 12.7 (*)    All other components within normal limits  CULTURE, BLOOD (ROUTINE X 2)  CULTURE, BLOOD (ROUTINE X 2)  URINE CULTURE  URINALYSIS, ROUTINE W REFLEX MICROSCOPIC (NOT AT [facility])  PROTIME-INR  PROTIME-INR  I-STAT CG4 LACTIC ACID, ED    Imaging Review Dg Chest 2 View  12/09/2014   CLINICAL DATA:  Altered mental status. Weakness. Shortness of breath. Rash on the back of the trauma for 1 day. Fever and cough. History of hypertension and diabetes. Nonsmoker.  EXAM: CHEST  2 VIEW  COMPARISON:  12/01/2014  FINDINGS: Focal airspace infiltration in the left  lower lung posteriorly with possible blunting of the left costophrenic angle. Changes may represent focal pneumonia. Borderline heart size without significant vascular congestion. No pneumothorax. Mediastinal contours appear intact. Degenerative changes in the shoulders and spine.  IMPRESSION: Focal infiltration in the left lung base with blunting of left costophrenic angle likely due to pneumonia.   Electronically Signed   By: Lucienne Capers M.D.   On: 12/09/2014 22:08   I have personally reviewed and evaluated these images and lab results as part of my medical decision-making.   EKG Interpretation   Date/Time:  Sunday December 09 2014 20:00:18 EDT Ventricular Rate:  120 PR Interval:    QRS Duration: 82 QT Interval:  309 QTC Calculation: 436 R Axis:   90 Text Interpretation:  Age not entered, assumed to be  59 years old for  purpose of ECG interpretation Atrial fibrillation Borderline right axis  deviation Nonspecific repol abnormality, inferior leads No significant  change since last tracing Confirmed by Maryan Rued  MD, Loree Fee ([id number]) on  12/09/2014 9:05:36 PM      Patient be admitted to the hospital for further evaluation and care.  Hospitalist will be down to evaluate the patient  Dalia Heading, PA-C 12/09/14 [place], MD 12/09/14 2330

## 2014-12-09 NOTE — ED Notes (Signed)
Pt. Received tylenol with EMS at 1830.

## 2014-12-09 NOTE — ED Notes (Signed)
Pt. Is from [facility] was recently hospitalized for acute encephalopathy and had hospital acquired pneumonia with a positive c.diff PCR. On EMS arrival pt. Temp 102.9 and pt is still altered thinks it is 2012 and clinton is president.

## 2014-12-09 NOTE — ED Notes (Signed)
Unable to obtain urine because does not make urine anymore

## 2014-12-09 NOTE — Progress Notes (Addendum)
ANTIBIOTIC CONSULT NOTE - INITIAL  Pharmacy Consult for Vanco/Zosyn Indication: rule out sepsis  Allergies  Allergen Reactions  . Dilaudid [Hydromorphone] Nausea And Vomiting    Patient Measurements: Height: 6\' 2"  (188 cm) Weight: 181 lb (82.101 kg) IBW/kg (Calculated) : 82.2 Adjusted Body Weight:    Vital Signs: Temp: 103.4 F (39.7 C) (08/21 2003) Temp Source: Rectal (08/21 2003) BP: 92/52 mmHg (08/21 2003) Pulse Rate: 106 (08/21 2003) Intake/Output from previous day:   Intake/Output from this shift:    Labs: No results for input(s): WBC, HGB, PLT, LABCREA, CREATININE in the last 72 hours. Estimated Creatinine Clearance: 24.1 mL/min (by C-G formula based on Cr of 3.83). No results for input(s): VANCOTROUGH, VANCOPEAK, VANCORANDOM, GENTTROUGH, GENTPEAK, GENTRANDOM, TOBRATROUGH, TOBRAPEAK, TOBRARND, AMIKACINPEAK, AMIKACINTROU, AMIKACIN in the last 72 hours.   Microbiology:   Medical History: Past Medical History  Diagnosis Date  . Atrial fibrillation     a. 11/18/2011 s/p DCCV - 150J;  b. Anticoagulation w/ Apixaban;  c. 11/2011 back in afib->asymptomatic.  . H/O alcohol abuse     a. drinks heavily on the weekends.  . Hypertension   . Diabetes mellitus   . Heart murmur     a. 09/2011 Echo: EF 55-60%, Triv AI, Mild MR, mildly dil LA.  . Diabetic peripheral neuropathy   . CKD (chronic kidney disease), stage III   . CHF (congestive heart failure)   . Pneumonia   . Sleep apnea     Medications:  See med rec  Assessment: 59 y/o M presents with suspected sepsis. He was recently hospitalized for acute encephalopathy, had hospital acquired pneumonia, enterococcal UTI and positive C.diff PCR. Patient has AMS and temp 102.9>>103.4, BP low 92/52 and HR 106. Patient also noted to be on Coumadin (had dose 8/21) and Lovenox for afib. Check INR now.  PMH: ESRD, DM, seizures, ACD, afib  Labs: WBC elevated 14.8, Scr 6.16 (ESRD), AST elevated 70, Alkphos elevated 280,  Tbili1.3  Goal of Therapy:  Vanco level <20 prior to re-dosing POST-HD  Plan:  Zosyn 3.375g in ED then 2.25g IV q8hr Vanco 1750mg  IV x 1 in ED then 1g IV qHD Daily INR F/u to resume anticoagulation   Joseph [name], PharmD, BCPS Clinical Staff Pharmacist Pager [phone]  Joseph [name] 12/09/2014,8:31 PM

## 2014-12-10 ENCOUNTER — Encounter (HOSPITAL_COMMUNITY): Payer: Self-pay | Admitting: Radiology

## 2014-12-10 ENCOUNTER — Inpatient Hospital Stay (HOSPITAL_COMMUNITY): Payer: 59

## 2014-12-10 DIAGNOSIS — I10 Essential (primary) hypertension: Secondary | ICD-10-CM

## 2014-12-10 DIAGNOSIS — R112 Nausea with vomiting, unspecified: Secondary | ICD-10-CM | POA: Diagnosis present

## 2014-12-10 DIAGNOSIS — J189 Pneumonia, unspecified organism: Secondary | ICD-10-CM | POA: Diagnosis present

## 2014-12-10 DIAGNOSIS — R197 Diarrhea, unspecified: Secondary | ICD-10-CM

## 2014-12-10 DIAGNOSIS — A419 Sepsis, unspecified organism: Secondary | ICD-10-CM | POA: Diagnosis present

## 2014-12-10 LAB — CBC
HCT: 27.7 % — ABNORMAL LOW (ref 39.0–52.0)
HCT: 28.6 % — ABNORMAL LOW (ref 39.0–52.0)
HEMATOCRIT: 30.5 % — AB (ref 39.0–52.0)
HEMOGLOBIN: 10.1 g/dL — AB (ref 13.0–17.0)
HEMOGLOBIN: 8.9 g/dL — AB (ref 13.0–17.0)
HEMOGLOBIN: 9.2 g/dL — AB (ref 13.0–17.0)
MCH: 31.8 pg (ref 26.0–34.0)
MCH: 30.6 pg (ref 26.0–34.0)
MCH: 31 pg (ref 26.0–34.0)
MCHC: 33.1 g/dL (ref 30.0–36.0)
MCHC: 32.1 g/dL (ref 30.0–36.0)
MCHC: 32.2 g/dL (ref 30.0–36.0)
MCV: 95.9 fL (ref 78.0–100.0)
MCV: 95.2 fL (ref 78.0–100.0)
MCV: 96.3 fL (ref 78.0–100.0)
Platelets: 167 10*3/uL (ref 150–400)
Platelets: 177 10*3/uL (ref 150–400)
Platelets: 182 10*3/uL (ref 150–400)
RBC: 3.18 MIL/uL — ABNORMAL LOW (ref 4.22–5.81)
RBC: 2.91 MIL/uL — AB (ref 4.22–5.81)
RBC: 2.97 MIL/uL — AB (ref 4.22–5.81)
RDW: 18.6 % — ABNORMAL HIGH (ref 11.5–15.5)
RDW: 18.7 % — ABNORMAL HIGH (ref 11.5–15.5)
RDW: 18.8 % — ABNORMAL HIGH (ref 11.5–15.5)
WBC: 14.3 10*3/uL — ABNORMAL HIGH (ref 4.0–10.5)
WBC: 16.1 10*3/uL — AB (ref 4.0–10.5)
WBC: 15.6 10*3/uL — AB (ref 4.0–10.5)

## 2014-12-10 LAB — COMPREHENSIVE METABOLIC PANEL
ALK PHOS: 274 U/L — AB (ref 38–126)
ALT: 55 U/L (ref 17–63)
AST: 62 U/L — AB (ref 15–41)
Albumin: 1.9 g/dL — ABNORMAL LOW (ref 3.5–5.0)
Anion gap: 14 (ref 5–15)
BUN: 37 mg/dL — AB (ref 6–20)
CALCIUM: 8.1 mg/dL — AB (ref 8.9–10.3)
CHLORIDE: 96 mmol/L — AB (ref 101–111)
CO2: 25 mmol/L (ref 22–32)
CREATININE: 6.35 mg/dL — AB (ref 0.61–1.24)
GFR calc non Af Amer: 9 mL/min — ABNORMAL LOW (ref >60)
GFR, EST AFRICAN AMERICAN: 10 mL/min — AB (ref >60)
Glucose, Bld: 38 mg/dL — CL (ref 65–99)
Potassium: 3.6 mmol/L (ref 3.5–5.1)
SODIUM: 135 mmol/L (ref 135–145)
Total Bilirubin: 1.9 mg/dL — ABNORMAL HIGH (ref 0.3–1.2)
Total Protein: 6.2 g/dL — ABNORMAL LOW (ref 6.5–8.1)

## 2014-12-10 LAB — RENAL FUNCTION PANEL
ALBUMIN: 1.7 g/dL — AB (ref 3.5–5.0)
ALBUMIN: 1.7 g/dL — AB (ref 3.5–5.0)
ANION GAP: 11 (ref 5–15)
ANION GAP: 14 (ref 5–15)
BUN: 41 mg/dL — AB (ref 6–20)
BUN: 26 mg/dL — ABNORMAL HIGH (ref 6–20)
CALCIUM: 7.6 mg/dL — AB (ref 8.9–10.3)
CHLORIDE: 96 mmol/L — AB (ref 101–111)
CO2: 23 mmol/L (ref 22–32)
CO2: 23 mmol/L (ref 22–32)
Calcium: 7.7 mg/dL — ABNORMAL LOW (ref 8.9–10.3)
Chloride: 97 mmol/L — ABNORMAL LOW (ref 101–111)
Creatinine, Ser: 6.62 mg/dL — ABNORMAL HIGH (ref 0.61–1.24)
Creatinine, Ser: 4.64 mg/dL — ABNORMAL HIGH (ref 0.61–1.24)
GFR calc Af Amer: 9 mL/min — ABNORMAL LOW (ref >60)
GFR calc non Af Amer: 8 mL/min — ABNORMAL LOW (ref >60)
GFR calc non Af Amer: 13 mL/min — ABNORMAL LOW (ref >60)
GFR, EST AFRICAN AMERICAN: 15 mL/min — AB (ref >60)
GLUCOSE: 221 mg/dL — AB (ref 65–99)
Glucose, Bld: 196 mg/dL — ABNORMAL HIGH (ref 65–99)
PHOSPHORUS: 3.1 mg/dL (ref 2.5–4.6)
PHOSPHORUS: 2.6 mg/dL (ref 2.5–4.6)
POTASSIUM: 3.8 mmol/L (ref 3.5–5.1)
Potassium: 4.2 mmol/L (ref 3.5–5.1)
SODIUM: 134 mmol/L — AB (ref 135–145)
Sodium: 130 mmol/L — ABNORMAL LOW (ref 135–145)

## 2014-12-10 LAB — C DIFFICILE QUICK SCREEN W PCR REFLEX
C DIFFICILE (CDIFF) TOXIN: NEGATIVE
C DIFFICLE (CDIFF) ANTIGEN: POSITIVE — AB

## 2014-12-10 LAB — PROTIME-INR
INR: 3.36 — ABNORMAL HIGH (ref 0.00–1.49)
INR: 2.97 — ABNORMAL HIGH (ref 0.00–1.49)
Prothrombin Time: 33.3 seconds — ABNORMAL HIGH (ref 11.6–15.2)
Prothrombin Time: 30.4 seconds — ABNORMAL HIGH (ref 11.6–15.2)

## 2014-12-10 LAB — GLUCOSE, CAPILLARY
GLUCOSE-CAPILLARY: 186 mg/dL — AB (ref 65–99)
Glucose-Capillary: 135 mg/dL — ABNORMAL HIGH (ref 65–99)
Glucose-Capillary: 162 mg/dL — ABNORMAL HIGH (ref 65–99)

## 2014-12-10 LAB — CBC WITH DIFFERENTIAL/PLATELET
BASOS ABS: 0 10*3/uL (ref 0.0–0.1)
Basophils Relative: 0 % (ref 0–1)
EOS PCT: 2 % (ref 0–5)
Eosinophils Absolute: 0.3 10*3/uL (ref 0.0–0.7)
HEMATOCRIT: 26.1 % — AB (ref 39.0–52.0)
HEMOGLOBIN: 8.4 g/dL — AB (ref 13.0–17.0)
LYMPHS ABS: 1.1 10*3/uL (ref 0.7–4.0)
LYMPHS PCT: 7 % — AB (ref 12–46)
MCH: 30.5 pg (ref 26.0–34.0)
MCHC: 32.2 g/dL (ref 30.0–36.0)
MCV: 94.9 fL (ref 78.0–100.0)
MONOS PCT: 5 % (ref 3–12)
Monocytes Absolute: 0.8 10*3/uL (ref 0.1–1.0)
NEUTROS ABS: 13 10*3/uL — AB (ref 1.7–7.7)
Neutrophils Relative %: 86 % — ABNORMAL HIGH (ref 43–77)
Platelets: 168 10*3/uL (ref 150–400)
RBC: 2.75 MIL/uL — ABNORMAL LOW (ref 4.22–5.81)
RDW: 18.8 % — ABNORMAL HIGH (ref 11.5–15.5)
WBC: 15.2 10*3/uL — ABNORMAL HIGH (ref 4.0–10.5)

## 2014-12-10 LAB — TYPE AND SCREEN
ABO/RH(D): O POS
ANTIBODY SCREEN: NEGATIVE

## 2014-12-10 LAB — CBG MONITORING, ED
Glucose-Capillary: 29 mg/dL — CL (ref 65–99)
Glucose-Capillary: 82 mg/dL (ref 65–99)

## 2014-12-10 LAB — LIPASE, BLOOD: Lipase: 38 U/L (ref 22–51)

## 2014-12-10 LAB — ABO/RH: ABO/RH(D): O POS

## 2014-12-10 LAB — I-STAT CG4 LACTIC ACID, ED: Lactic Acid, Venous: 1.37 mmol/L (ref 0.5–2.0)

## 2014-12-10 LAB — TROPONIN I: Troponin I: 0.1 ng/mL — ABNORMAL HIGH (ref <0.031)

## 2014-12-10 LAB — PHENYTOIN LEVEL, TOTAL: PHENYTOIN LVL: 2.6 ug/mL — AB (ref 10.0–20.0)

## 2014-12-10 LAB — PROCALCITONIN: Procalcitonin: 22.33 ng/mL

## 2014-12-10 MED ORDER — DILTIAZEM HCL 100 MG IV SOLR
5.0000 mg/h | INTRAVENOUS | Status: DC
  Administered 2014-12-10: 15 mg/h via INTRAVENOUS
  Administered 2014-12-11: 5 mg/h via INTRAVENOUS
  Administered 2014-12-12: 15 mg/h via INTRAVENOUS
  Administered 2014-12-12: 5 mg/h via INTRAVENOUS
  Administered 2014-12-13 – 2014-12-15 (×7): 15 mg/h via INTRAVENOUS
  Filled 2014-12-10 (×13): qty 100

## 2014-12-10 MED ORDER — SODIUM CHLORIDE 0.9 % IV SOLN: 100.0000 mL | INTRAVENOUS | Status: DC | PRN

## 2014-12-10 MED ORDER — SACCHAROMYCES BOULARDII 250 MG PO CAPS
250.0000 mg | ORAL_CAPSULE | Freq: Two times a day (BID) | ORAL | Status: DC
  Administered 2014-12-10 – 2014-12-26 (×33): 250 mg via ORAL
  Filled 2014-12-10 (×32): qty 1

## 2014-12-10 MED ORDER — AMIODARONE HCL IN DEXTROSE 360-4.14 MG/200ML-% IV SOLN
60.0000 mg/h | INTRAVENOUS | Status: DC
  Administered 2014-12-10: 60 mg/h via INTRAVENOUS
  Filled 2014-12-10: qty 200

## 2014-12-10 MED ORDER — INSULIN ASPART 100 UNIT/ML ~~LOC~~ SOLN
0.0000 [IU] | Freq: Three times a day (TID) | SUBCUTANEOUS | Status: DC
  Administered 2014-12-11: 2 [IU] via SUBCUTANEOUS
  Administered 2014-12-11: 3 [IU] via SUBCUTANEOUS
  Administered 2014-12-11: 7 [IU] via SUBCUTANEOUS
  Administered 2014-12-12 – 2014-12-13 (×2): 3 [IU] via SUBCUTANEOUS
  Administered 2014-12-13: 5 [IU] via SUBCUTANEOUS
  Administered 2014-12-13: 3 [IU] via SUBCUTANEOUS
  Administered 2014-12-13: 5 [IU] via SUBCUTANEOUS
  Administered 2014-12-14: 2 [IU] via SUBCUTANEOUS
  Administered 2014-12-14: 1 [IU] via SUBCUTANEOUS
  Administered 2014-12-15: 3 [IU] via SUBCUTANEOUS

## 2014-12-10 MED ORDER — HEPARIN SODIUM (PORCINE) 1000 UNIT/ML DIALYSIS: 1000.0000 [IU] | INTRAMUSCULAR | Status: DC | PRN

## 2014-12-10 MED ORDER — DARBEPOETIN ALFA 100 MCG/0.5ML IJ SOSY
100.0000 ug | PREFILLED_SYRINGE | INTRAMUSCULAR | Status: DC
  Administered 2014-12-17: 100 ug via INTRAVENOUS
  Filled 2014-12-10 (×3): qty 0.5

## 2014-12-10 MED ORDER — COLCHICINE 0.6 MG PO TABS
0.6000 mg | ORAL_TABLET | Freq: Two times a day (BID) | ORAL | Status: DC
  Administered 2014-12-10: 0.6 mg via ORAL
  Filled 2014-12-10: qty 1

## 2014-12-10 MED ORDER — SODIUM CHLORIDE 0.9 % IJ SOLN
3.0000 mL | Freq: Two times a day (BID) | INTRAMUSCULAR | Status: DC
  Administered 2014-12-10 – 2014-12-25 (×23): 3 mL via INTRAVENOUS
  Administered 2014-12-25: 10 mL via INTRAVENOUS
  Administered 2014-12-26: 3 mL via INTRAVENOUS
  Filled 2014-12-10: qty 3

## 2014-12-10 MED ORDER — COLCHICINE 0.6 MG PO TABS
0.6000 mg | ORAL_TABLET | Freq: Every day | ORAL | Status: DC
  Administered 2014-12-11 – 2014-12-26 (×15): 0.6 mg via ORAL
  Filled 2014-12-10 (×16): qty 1

## 2014-12-10 MED ORDER — LIDOCAINE HCL (PF) 1 % IJ SOLN: 5.0000 mL | INTRAMUSCULAR | Status: DC | PRN

## 2014-12-10 MED ORDER — INSULIN GLARGINE 100 UNIT/ML ~~LOC~~ SOLN
20.0000 [IU] | Freq: Two times a day (BID) | SUBCUTANEOUS | Status: DC
  Filled 2014-12-10 (×2): qty 0.2

## 2014-12-10 MED ORDER — PENTAFLUOROPROP-TETRAFLUOROETH EX AERO: 1.0000 "application " | INHALATION_SPRAY | CUTANEOUS | Status: DC | PRN

## 2014-12-10 MED ORDER — GLUCOSE-VITAMIN C 4-6 GM-MG PO CHEW
1.0000 | CHEWABLE_TABLET | Freq: Once | ORAL | Status: AC
  Administered 2014-12-10: 1 via ORAL

## 2014-12-10 MED ORDER — RENA-VITE PO TABS
1.0000 | ORAL_TABLET | Freq: Every day | ORAL | Status: DC
  Administered 2014-12-10 – 2014-12-15 (×6): 1 via ORAL
  Administered 2014-12-16: 22:00:00 via ORAL
  Administered 2014-12-17 – 2014-12-25 (×9): 1 via ORAL
  Filled 2014-12-10 (×17): qty 1

## 2014-12-10 MED ORDER — ALTEPLASE 2 MG IJ SOLR
2.0000 mg | Freq: Once | INTRAMUSCULAR | Status: DC | PRN
  Filled 2014-12-10: qty 2

## 2014-12-10 MED ORDER — DILTIAZEM HCL 100 MG IV SOLR
INTRAVENOUS | Status: AC
  Administered 2014-12-10: 15 mg/h via INTRAVENOUS
  Filled 2014-12-10: qty 100

## 2014-12-10 MED ORDER — DILTIAZEM HCL ER COATED BEADS 120 MG PO CP24
120.0000 mg | ORAL_CAPSULE | Freq: Every day | ORAL | Status: DC
  Filled 2014-12-10: qty 1

## 2014-12-10 MED ORDER — HEPARIN SODIUM (PORCINE) 1000 UNIT/ML DIALYSIS
1000.0000 [IU] | INTRAMUSCULAR | Status: DC | PRN
  Filled 2014-12-10: qty 1

## 2014-12-10 MED ORDER — NEPRO/CARBSTEADY PO LIQD: 237.0000 mL | ORAL | Status: DC | PRN

## 2014-12-10 MED ORDER — AMIODARONE HCL IN DEXTROSE 360-4.14 MG/200ML-% IV SOLN
30.0000 mg/h | INTRAVENOUS | Status: DC
  Administered 2014-12-11 – 2014-12-13 (×5): 30 mg/h via INTRAVENOUS
  Filled 2014-12-10 (×11): qty 200

## 2014-12-10 MED ORDER — NEPRO/CARBSTEADY PO LIQD
237.0000 mL | ORAL | Status: DC | PRN
  Filled 2014-12-10: qty 237

## 2014-12-10 MED ORDER — NEPRO/CARBSTEADY PO LIQD
237.0000 mL | Freq: Two times a day (BID) | ORAL | Status: DC
  Administered 2014-12-10 – 2014-12-26 (×17): 237 mL via ORAL

## 2014-12-10 MED ORDER — SODIUM CHLORIDE 0.9 % IV BOLUS (SEPSIS)
250.0000 mL | Freq: Once | INTRAVENOUS | Status: AC
  Administered 2014-12-10: 250 mL via INTRAVENOUS

## 2014-12-10 MED ORDER — SEVELAMER CARBONATE 800 MG PO TABS
1600.0000 mg | ORAL_TABLET | Freq: Three times a day (TID) | ORAL | Status: DC
  Filled 2014-12-10 (×4): qty 2

## 2014-12-10 MED ORDER — LIDOCAINE-PRILOCAINE 2.5-2.5 % EX CREA: 1.0000 "application " | TOPICAL_CREAM | CUTANEOUS | Status: DC | PRN

## 2014-12-10 MED ORDER — PHENYTOIN SODIUM EXTENDED 100 MG PO CAPS
300.0000 mg | ORAL_CAPSULE | Freq: Every morning | ORAL | Status: DC
  Administered 2014-12-10 – 2014-12-26 (×17): 300 mg via ORAL
  Filled 2014-12-10 (×17): qty 3

## 2014-12-10 MED ORDER — GLUCOSE-VITAMIN C 4-6 GM-MG PO CHEW
CHEWABLE_TABLET | ORAL | Status: AC
  Filled 2014-12-10: qty 1

## 2014-12-10 MED ORDER — ALTEPLASE 2 MG IJ SOLR: 2.0000 mg | Freq: Once | INTRAMUSCULAR | Status: DC | PRN

## 2014-12-10 MED ORDER — ATORVASTATIN CALCIUM 40 MG PO TABS
40.0000 mg | ORAL_TABLET | Freq: Every day | ORAL | Status: DC
  Administered 2014-12-10 – 2014-12-25 (×16): 40 mg via ORAL
  Filled 2014-12-10 (×17): qty 1

## 2014-12-10 MED ORDER — VANCOMYCIN 50 MG/ML ORAL SOLUTION
125.0000 mg | Freq: Four times a day (QID) | ORAL | Status: DC
  Administered 2014-12-10 – 2014-12-11 (×3): 125 mg via ORAL
  Filled 2014-12-10 (×10): qty 2.5

## 2014-12-10 MED ORDER — ACETAMINOPHEN 325 MG PO TABS
650.0000 mg | ORAL_TABLET | Freq: Four times a day (QID) | ORAL | Status: DC | PRN
  Administered 2014-12-10 – 2014-12-12 (×4): 650 mg via ORAL
  Filled 2014-12-10 (×4): qty 2

## 2014-12-10 MED ORDER — VITAMIN K1 10 MG/ML IJ SOLN
5.0000 mg | Freq: Once | INTRAVENOUS | Status: AC
  Administered 2014-12-10: 5 mg via INTRAVENOUS
  Filled 2014-12-10: qty 0.5

## 2014-12-10 MED ORDER — LIDOCAINE-PRILOCAINE 2.5-2.5 % EX CREA
1.0000 "application " | TOPICAL_CREAM | CUTANEOUS | Status: DC | PRN
  Filled 2014-12-10: qty 5

## 2014-12-10 MED ORDER — HEPARIN SODIUM (PORCINE) 1000 UNIT/ML DIALYSIS
2000.0000 [IU] | INTRAMUSCULAR | Status: DC | PRN
  Filled 2014-12-10: qty 2

## 2014-12-10 MED ORDER — GUAIFENESIN-DM 100-10 MG/5ML PO SYRP
5.0000 mL | ORAL_SOLUTION | ORAL | Status: DC | PRN
  Administered 2014-12-10 – 2014-12-11 (×5): 5 mL via ORAL
  Filled 2014-12-10 (×4): qty 5

## 2014-12-10 MED ORDER — FLUOCINONIDE 0.05 % EX OINT
TOPICAL_OINTMENT | Freq: Four times a day (QID) | CUTANEOUS | Status: DC
  Administered 2014-12-10 (×2): via TOPICAL
  Administered 2014-12-10: 1 via TOPICAL
  Administered 2014-12-10 – 2014-12-11 (×2): via TOPICAL
  Administered 2014-12-11 (×3): 1 via TOPICAL
  Administered 2014-12-12 – 2014-12-17 (×18): via TOPICAL
  Administered 2014-12-17: 1 via TOPICAL
  Administered 2014-12-17 – 2014-12-25 (×22): via TOPICAL
  Administered 2014-12-25: 1 via TOPICAL
  Administered 2014-12-25 – 2014-12-26 (×2): via TOPICAL
  Filled 2014-12-10 (×7): qty 15

## 2014-12-10 MED ORDER — VANCOMYCIN HCL IN DEXTROSE 1-5 GM/200ML-% IV SOLN
1000.0000 mg | INTRAVENOUS | Status: DC
  Administered 2014-12-12 – 2014-12-14 (×2): 1000 mg via INTRAVENOUS
  Filled 2014-12-10 (×4): qty 200

## 2014-12-10 MED ORDER — METOPROLOL TARTRATE 50 MG PO TABS
50.0000 mg | ORAL_TABLET | Freq: Two times a day (BID) | ORAL | Status: DC
  Administered 2014-12-10: 50 mg via ORAL
  Filled 2014-12-10: qty 2
  Filled 2014-12-10 (×4): qty 1

## 2014-12-10 MED ORDER — CALCITRIOL 0.25 MCG PO CAPS
1.7500 ug | ORAL_CAPSULE | ORAL | Status: DC
  Administered 2014-12-10 – 2014-12-26 (×6): 1.75 ug via ORAL
  Filled 2014-12-10 (×2): qty 1
  Filled 2014-12-10: qty 7
  Filled 2014-12-10: qty 1
  Filled 2014-12-10 (×3): qty 7

## 2014-12-10 MED ORDER — AMIODARONE LOAD VIA INFUSION
150.0000 mg | Freq: Once | INTRAVENOUS | Status: AC
  Administered 2014-12-10: 150 mg via INTRAVENOUS

## 2014-12-10 NOTE — ED Notes (Signed)
Patient transported to MRI 

## 2014-12-10 NOTE — Consult Note (Signed)
Indication for Consultation:  Management of ESRD/hemodialysis; anemia, hypertension/volume and secondary hyperparathyroidism  HPI: Joseph [name] is a 59 y.o. male who was brought to the ED yesterday for complaints of fever, cough and persistent AMS. He receives HD MWF @ [place], he had an extra HD Saturday for volume. Recent Admit for AMS/?Encephalitis- treated with steriods, Seizure , Cddff, HCAP. He was discharged to SNF. Per wife, he has been having low grade fevers almost every night, he has developed a cough with blood tinged sputum and is not eating much at all. Mental status has never returned to baseline. In the ED temp 103.4, tachy, blood cultures pending and he was started on antibiotics. Was admitted for further eval. Will arrange HD.  Past Medical History  Diagnosis Date  . Atrial fibrillation     a. 11/18/2011 s/p DCCV - 150J;  b. Anticoagulation w/ Apixaban;  c. 11/2011 back in afib->asymptomatic.  . H/O alcohol abuse     a. drinks heavily on the weekends.  . Hypertension   . Diabetes mellitus   . Heart murmur     a. 09/2011 Echo: EF 55-60%, Triv AI, Mild MR, mildly dil LA.  . Diabetic peripheral neuropathy   . CKD (chronic kidney disease), stage III   . CHF (congestive heart failure)   . Pneumonia   . Sleep apnea    Past Surgical History  Procedure Laterality Date  . Eye sx  11/11/2011    left eye  . Cardioversion  11/18/2011    Procedure: CARDIOVERSION;  Surgeon: Josue Hector, MD;  Location: [facility] ENDOSCOPY;  Service: Cardiovascular;  Laterality: N/A;  . Colonoscopy w/ biopsies and polypectomy    . Av fistula placement Left 02/13/2014    Procedure: INSERTION OF ARTERIOVENOUS (AV) GORE-TEX GRAFT ARM;  Surgeon: Angelia Mould, MD;  Location: [place];  Service: Vascular;  Laterality: Left;  . Right heart catheterization N/A 10/11/2013    Procedure: RIGHT HEART CATH;  Surgeon: Larey Dresser, MD;  Location: [facility] CATH LAB;  Service: Cardiovascular;  Laterality: N/A;   Family  History  Problem Relation Age of Onset  . Heart disease Mother     before age 30  . Diabetes Father   . Diabetes Sister   . Peripheral vascular disease Sister     amputation  . Cancer Neg Hx   . Stroke Neg Hx    Social History:  reports that he quit smoking about 20 years ago. His smoking use included Cigarettes. He has a .05 pack-year smoking history. He has never used smokeless tobacco. He reports that he does not drink alcohol or use illicit drugs. Allergies  Allergen Reactions  . Keflex [Cephalexin] Other (See Comments)    c-diff reaction  . Dilaudid [Hydromorphone] Nausea And Vomiting   Prior to Admission medications   Medication Sig Start Date End Date Taking? Authorizing Provider  atorvastatin (LIPITOR) 40 MG tablet Take 40 mg by mouth at bedtime.    Yes Historical Provider, MD  calcitRIOL (ROCALTROL) 0.25 MCG capsule Take 7 capsules (1.75 mcg total) by mouth every Monday, Wednesday, and Friday with hemodialysis. 12/02/14  Yes Thurnell Lose, MD  colchicine 0.6 MG tablet TAKE 1 TABLET (0.6 MG TOTAL) BY MOUTH 2 (TWO) TIMES DAILY. 08/14/14  Yes Jearld Fenton, NP  diltiazem (CARDIZEM CD) 120 MG 24 hr capsule Take 1 capsule (120 mg total) by mouth daily. 03/03/14  Yes Donne Hazel, MD  ethyl chloride spray Apply 1 application topically every other day.  Mon Wed Fri - done with dialysis 05/12/14  Yes Historical Provider, MD  fluocinonide ointment (LIDEX) 0.05 % Apply topically 4 (four) times daily. 12/02/14  Yes Thurnell Lose, MD  hydrocerin (EUCERIN) CREA Apply 1 application topically 4 (four) times daily. 12/02/14  Yes Thurnell Lose, MD  insulin aspart (NOVOLOG) 100 UNIT/ML injection Before each meal 3 times a day, 140-199 - 2 units, 200-250 - 4 units, 251-299 - 6 units,  300-349 - 8 units,  350 or above 10 units. 12/02/14  Yes Thurnell Lose, MD  Insulin Glargine (LANTUS SOLOSTAR) 100 UNIT/ML Solostar Pen Inject 20 Units into the skin 2 (two) times daily. 12/02/14  Yes Thurnell Lose, MD  levocetirizine (XYZAL) 5 MG tablet Take 5 mg by mouth every evening.   Yes Historical Provider, MD  metoprolol tartrate (LOPRESSOR) 50 MG tablet Take 1 tablet (50 mg total) by mouth 2 (two) times daily. 12/02/14  Yes Thurnell Lose, MD  Nutritional Supplements (FEEDING SUPPLEMENT, NEPRO CARB STEADY,) LIQD Take 237 mLs by mouth 2 (two) times daily between meals. 11/09/14  Yes Kelvin Cellar, MD  ondansetron (ZOFRAN) 4 MG tablet Take 4 mg by mouth 3 (three) times daily as needed for nausea or vomiting.   Yes Historical Provider, MD  pantoprazole (PROTONIX) 20 MG tablet Take 1 tablet (20 mg total) by mouth 2 (two) times daily. 12/02/14  Yes Thurnell Lose, MD  phenytoin (DILANTIN) 300 MG ER capsule Take 1 capsule (300 mg total) by mouth at bedtime. Patient taking differently: Take 300 mg by mouth every morning.  12/02/14  Yes Thurnell Lose, MD  saccharomyces boulardii (FLORASTOR) 250 MG capsule Take 1 capsule (250 mg total) by mouth 2 (two) times daily. Patient taking differently: Take 250 mg by mouth daily.  11/09/14  Yes Kelvin Cellar, MD  sevelamer carbonate (RENVELA) 800 MG tablet Take 2 tablets (1,600 mg total) by mouth 3 (three) times daily with meals. 12/02/14  Yes Thurnell Lose, MD  warfarin (COUMADIN) 5 MG tablet Take 1 tablet (5 mg total) by mouth daily. 03/03/14  Yes Donne Hazel, MD  enoxaparin (LOVENOX) 150 MG/ML injection Inject 0.55 mLs (85 mg total) into the skin daily. Stopped once INR reaches 2. 12/02/14   Thurnell Lose, MD   Current Facility-Administered Medications  Medication Dose Route Frequency Provider Last Rate Last Dose  . acetaminophen (TYLENOL) tablet 650 mg  650 mg Oral Q6H PRN Rise Patience, MD   650 mg at 12/10/14 0545  . atorvastatin (LIPITOR) tablet 40 mg  40 mg Oral QHS Rise Patience, MD      . calcitRIOL (ROCALTROL) capsule 1.75 mcg  1.75 mcg Oral Q M,W,F-HD Rise Patience, MD      . Derrill Memo ON 12/11/2014] colchicine tablet 0.6  mg  0.6 mg Oral Daily Kinnie Feil, MD      . diltiazem (CARDIZEM CD) 24 hr capsule 120 mg  120 mg Oral Daily Kinnie Feil, MD      . feeding supplement (NEPRO CARB STEADY) liquid 237 mL  237 mL Oral BID BM Rise Patience, MD      . fluocinonide ointment (LIDEX) 0.05 %   Topical QID Rise Patience, MD   1 application at 99/35/70 [phone]  . insulin aspart (novoLOG) injection 0-9 Units  0-9 Units Subcutaneous TID WC Rise Patience, MD      . metoprolol tartrate (LOPRESSOR) tablet 50 mg  50 mg Oral BID  Kinnie Feil, MD   50 mg at 12/10/14 0557  . phenytoin (DILANTIN) ER capsule 300 mg  300 mg Oral q morning - 10a Rise Patience, MD      . piperacillin-tazobactam (ZOSYN) IVPB 2.25 g  2.25 g Intravenous Q8H Crystal Trellis Moment, RPH 100 mL/hr at 12/10/14 0546 2.25 g at 12/10/14 0546  . saccharomyces boulardii (FLORASTOR) capsule 250 mg  250 mg Oral BID Rise Patience, MD      . sevelamer carbonate (RENVELA) tablet 1,600 mg  1,600 mg Oral TID WC Rise Patience, MD      . sodium chloride 0.9 % injection 3 mL  3 mL Intravenous Q12H Rise Patience, MD   3 mL at 12/10/14 0546  . vancomycin (VANCOCIN) 50 mg/mL oral solution 125 mg  125 mg Oral 4 times per day Kinnie Feil, MD      . vancomycin (VANCOCIN) IVPB 1000 mg/200 mL premix  1,000 mg Intravenous Q M,W,F-HD Erenest Blank, [facility]       Labs: Basic Metabolic Panel:  Recent Labs Lab 12/09/14 2030 12/10/14 0440  NA 133* 135  K 3.7 3.6  CL 95* 96*  CO2 25 25  GLUCOSE 69 38*  BUN 35* 37*  CREATININE 6.16* 6.35*  CALCIUM 8.1* 8.1*   Liver Function Tests:  Recent Labs Lab 12/09/14 2030 12/10/14 0440  AST 70* 62*  ALT 55 55  ALKPHOS 280* 274*  BILITOT 1.3* 1.9*  PROT 5.7* 6.2*  ALBUMIN 1.9* 1.9*    Recent Labs Lab 12/10/14 0440  LIPASE 38   No results for input(s): AMMONIA in the last 168 hours. CBC:  Recent Labs Lab 12/09/14 2030 12/10/14 0238 12/10/14 0440  WBC 14.8* 14.3*  16.1*  NEUTROABS 12.7*  --   --   HGB 8.6* 10.1* 8.9*  HCT 26.4* 30.5* 27.7*  MCV 96.4 95.9 95.2  PLT 157 167 177   Cardiac Enzymes:  Recent Labs Lab 12/10/14 0440  TROPONINI 0.10*   CBG:  Recent Labs Lab 12/10/14 0536 12/10/14 0630 12/10/14 1017  GLUCAP 29* 82 135*   Iron Studies: No results for input(s): IRON, TIBC, TRANSFERRIN, FERRITIN in the last 72 hours. Studies/Results: Ct Abdomen Pelvis Wo Contrast  12/10/2014   CLINICAL DATA:  Hematemesis. Fever. Altered mental status. Recent hospitalization for acute encephalopathy, hospital acquired pneumonia and positive c. diff PCR.  EXAM: CT CHEST, ABDOMEN AND PELVIS WITHOUT CONTRAST  TECHNIQUE: Multidetector CT imaging of the chest, abdomen and pelvis was performed following the standard protocol without IV contrast.  COMPARISON:  CT 11/27/2014  FINDINGS: CT CHEST FINDINGS  New from prior exam confluent and ground-glass airspace opacity in the left lower lobe, with air bronchograms. More diffuse ground-glass opacity throughout all lobes of both lungs, also new from prior. The tree in bud opacities in the right upper lobe are similar. Small right pleural effusion, unchanged from prior exam. There is a diminutive left pleural effusion that is new. No pericardial effusion.  The heart size is normal. Coronary artery calcifications are seen. Progressive mediastinal adenopathy, for example left lower paratracheal lymph node measures 1.7 cm in short axis dimension. There prominent mildly enlarged upper paratracheal and prevascular lymph nodes. Limited assessment for hilar adenopathy given lack of contrast. Thoracic aorta is normal in caliber with mild atherosclerosis.  CT ABDOMEN AND PELVIS FINDINGS  Small volume of perihepatic ascites, diminished in volume from prior exam. The liver appears prominent in size measuring 19.2 cm craniocaudal dimension. No focal  hepatic lesion allowing for lack contrast. The gallbladder is decompressed with  gallbladder wall thickening. The spleen is normal in size. The adrenal glands are normal. Mild bilateral renal parenchymal atrophy, no hydronephrosis.  Questionable minimal haziness about the head of the pancreas. No peripancreatic fluid collection.  The stomach is decompressed. No small bowel thickening. Fluid within the cecum and proximal ascending colon without colonic wall thickening. Remainder the colon is decompressed.  No retroperitoneal adenopathy. Abdominal aorta is normal in caliber. Moderate atherosclerosis without aneurysm.  Within the pelvis there is a small volume of ascites. Bladder is minimally distended with equivocal bladder wall thickening. Prostate gland is normal in size. Atherosclerosis noted of the inguinal vasculature. Small fat containing umbilical hernia. Soft tissue densities in the anterior abdominal wall, may be related to injections.  Review of the osseous structures of the chest abdomen and pelvis demonstrates no acute osseous abnormality. No suspicious osseous lesion.  IMPRESSION: 1. Development of confluent airspace disease with air bronchograms in the left lower lobe. Additionally development of mild diffuse ground-glass opacity throughout all lobes of both lungs. This diffuse appearance may be infectious or inflammatory in etiology or represent pulmonary edema. Left lower lobe findings favor aspiration or pneumonia, which may be a separate process than the more diffuse ground-glass opacities. 2. Small right pleural effusion, not significantly changed. Development of diminutive left pleural effusion that may be reactive. New mediastinal adenopathy which is likely reactive. 3. Persistent but decreased perihepatic ascites. Liver appears prominent in size. Decompressed gallbladder with gallbladder wall thickening. Constellation of findings suggests chronic liver disease. 4. Questionable edema about the head of the pancreas. This may be related to third spacing/ascites versus pancreatic  inflammation. 5. Minimal fluid in the cecum and ascending colon without associated colonic wall thickening.   Electronically Signed   By: Jeb Levering M.D.   On: 12/10/2014 01:58   Dg Chest 2 View  12/09/2014   CLINICAL DATA:  Altered mental status. Weakness. Shortness of breath. Rash on the back of the trauma for 1 day. Fever and cough. History of hypertension and diabetes. Nonsmoker.  EXAM: CHEST  2 VIEW  COMPARISON:  12/01/2014  FINDINGS: Focal airspace infiltration in the left lower lung posteriorly with possible blunting of the left costophrenic angle. Changes may represent focal pneumonia. Borderline heart size without significant vascular congestion. No pneumothorax. Mediastinal contours appear intact. Degenerative changes in the shoulders and spine.  IMPRESSION: Focal infiltration in the left lung base with blunting of left costophrenic angle likely due to pneumonia.   Electronically Signed   By: Lucienne Capers M.D.   On: 12/09/2014 22:08   Ct Head Wo Contrast  12/10/2014   CLINICAL DATA:  Hematemesis. Altered mental status. Acute encephalopathy. Recent hospitalization for acute encephalopathy, complicated by hospital acquired pneumonia and positive C difficile.  EXAM: CT HEAD WITHOUT CONTRAST  TECHNIQUE: Contiguous axial images were obtained from the base of the skull through the vertex without intravenous contrast.  COMPARISON:  MRI brain 11/26/2014.  CT head 11/18/2014.  FINDINGS: Mild diffuse cerebral atrophy. No ventricular dilatation. Old area of encephalomalacia in the left posterior occipital lobe. No mass effect or midline shift. No abnormal extra-axial fluid collections. Gray-white matter junctions are distinct. Basal cisterns are not effaced. No evidence of acute intracranial hemorrhage. No depressed skull fractures. Visualized paranasal sinuses and mastoid air cells are not opacified. Vascular calcifications.  IMPRESSION: No acute intracranial abnormalities. Focal encephalomalacia in  the left posterior occipital lobe is unchanged since prior study.  Electronically Signed   By: Lucienne Capers M.D.   On: 12/10/2014 01:42   Ct Chest Wo Contrast  12/10/2014   CLINICAL DATA:  Hematemesis. Fever. Altered mental status. Recent hospitalization for acute encephalopathy, hospital acquired pneumonia and positive c. diff PCR.  EXAM: CT CHEST, ABDOMEN AND PELVIS WITHOUT CONTRAST  TECHNIQUE: Multidetector CT imaging of the chest, abdomen and pelvis was performed following the standard protocol without IV contrast.  COMPARISON:  CT 11/27/2014  FINDINGS: CT CHEST FINDINGS  New from prior exam confluent and ground-glass airspace opacity in the left lower lobe, with air bronchograms. More diffuse ground-glass opacity throughout all lobes of both lungs, also new from prior. The tree in bud opacities in the right upper lobe are similar. Small right pleural effusion, unchanged from prior exam. There is a diminutive left pleural effusion that is new. No pericardial effusion.  The heart size is normal. Coronary artery calcifications are seen. Progressive mediastinal adenopathy, for example left lower paratracheal lymph node measures 1.7 cm in short axis dimension. There prominent mildly enlarged upper paratracheal and prevascular lymph nodes. Limited assessment for hilar adenopathy given lack of contrast. Thoracic aorta is normal in caliber with mild atherosclerosis.  CT ABDOMEN AND PELVIS FINDINGS  Small volume of perihepatic ascites, diminished in volume from prior exam. The liver appears prominent in size measuring 19.2 cm craniocaudal dimension. No focal hepatic lesion allowing for lack contrast. The gallbladder is decompressed with gallbladder wall thickening. The spleen is normal in size. The adrenal glands are normal. Mild bilateral renal parenchymal atrophy, no hydronephrosis.  Questionable minimal haziness about the head of the pancreas. No peripancreatic fluid collection.  The stomach is decompressed.  No small bowel thickening. Fluid within the cecum and proximal ascending colon without colonic wall thickening. Remainder the colon is decompressed.  No retroperitoneal adenopathy. Abdominal aorta is normal in caliber. Moderate atherosclerosis without aneurysm.  Within the pelvis there is a small volume of ascites. Bladder is minimally distended with equivocal bladder wall thickening. Prostate gland is normal in size. Atherosclerosis noted of the inguinal vasculature. Small fat containing umbilical hernia. Soft tissue densities in the anterior abdominal wall, may be related to injections.  Review of the osseous structures of the chest abdomen and pelvis demonstrates no acute osseous abnormality. No suspicious osseous lesion.  IMPRESSION: 1. Development of confluent airspace disease with air bronchograms in the left lower lobe. Additionally development of mild diffuse ground-glass opacity throughout all lobes of both lungs. This diffuse appearance may be infectious or inflammatory in etiology or represent pulmonary edema. Left lower lobe findings favor aspiration or pneumonia, which may be a separate process than the more diffuse ground-glass opacities. 2. Small right pleural effusion, not significantly changed. Development of diminutive left pleural effusion that may be reactive. New mediastinal adenopathy which is likely reactive. 3. Persistent but decreased perihepatic ascites. Liver appears prominent in size. Decompressed gallbladder with gallbladder wall thickening. Constellation of findings suggests chronic liver disease. 4. Questionable edema about the head of the pancreas. This may be related to third spacing/ascites versus pancreatic inflammation. 5. Minimal fluid in the cecum and ascending colon without associated colonic wall thickening.   Electronically Signed   By: Jeb Levering M.D.   On: 12/10/2014 01:58   Mr Brain Wo Contrast  12/10/2014   CLINICAL DATA:  Initial evaluation for acute  encephalopathy.  EXAM: MRI HEAD WITHOUT CONTRAST  TECHNIQUE: Multiplanar, multiecho pulse sequences of the brain and surrounding structures were obtained without intravenous contrast.  COMPARISON:  Prior CT from earlier the same day as well as previous MRI from 11/26/2014.  FINDINGS: Diffuse prominence of the CSF containing spaces is compatible with generalized cerebral atrophy. Patchy and confluent T2/FLAIR hyperintensity within the periventricular and deep white matter both cerebral hemispheres again seen, most consistent with chronic small vessel ischemic disease. 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  No abnormal foci of restricted diffusion to suggest acute intracranial infarct. Gray-white matter differentiation maintained. Normal intravascular flow voids are preserved. No acute or chronic intracranial hemorrhage.  There is mildly hyperintense T2/FLAIR signal intensity involving the mesial temporal lobes/hippocampi bilaterally, similar relative to the initial MRI from 11/21/2014, but worsened relative to subsequent MRI from 11/26/2014. There is question of trace associated restricted diffusion within knee right hippocampus (series 7, image 17). No other significant diffusion-weighted signal abnormality.  No mass lesion, midline shift, or mass effect. No hydrocephalus. No extra-axial fluid collection.  Craniocervical junction within normal limits. Pituitary gland normal.  No acute abnormality about the orbits.  Paranasal sinuses are clear. Mild scattered opacity present within the mastoid air cells bilaterally, slightly greater on the right. Inner ear structures grossly normal.  Bone marrow signal intensity within normal limits. Scalp soft tissues unremarkable.  IMPRESSION: 1. Persistent abnormal T2/FLAIR signal intensity involving the mesial temporal lobes/hippocampi bilaterally, with question of trace diffusion abnormality within the right  hippocampus. While these findings appear overall improved relative to previous MRI from 11/21/2014, the T2/FLAIR signal abnormality appears slightly worsened relative to prior MRI from 11/26/2014 (although the diffusion changes are improved from that exam as well). 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 As before, herpes encephalitis could also have this appearance. 2. No other acute intracranial process. 3. 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   Electronically Signed   By: Jeannine Boga M.D.   On: 12/10/2014 05:50   Review of Systems: Per wife- night fever, poor appetite, LE edema, blood tinged sputum but clearing. AMS- has not returned to baseline.    Physical Exam: Filed Vitals:   12/10/14 0517 12/10/14 0545 12/10/14 0557 12/10/14 0615  BP: 95/48 127/62 127/62 110/62  Pulse: 121 137 149   Temp:      TempSrc:      Resp:  27  27  Height:      Weight:      SpO2:  96%  100%     General: Well developed, well nourished, in no acute distress. Resting in bed- appears comfortable.  Head: Normocephalic, atraumatic, sclera non-icteric, mucus membranes are moist Neck: Supple. JVD not elevated. Lungs: Bibasilar rhonchi. Breathing is unlabored. Heart: RRR with S1 S2. No murmurs, rubs, or gallops appreciated. Abdomen: Soft, non-tender, non-distended with normoactive bowel sounds. No rebound/guarding. No obvious abdominal masses. M-S:  Strength and tone appear normal for age. Lower extremities: +3 LE edema.  Neuro: Alert and oriented to person and place.  Psych:  Responds to questions appropriately with a normal affect. Dialysis Access:   L AVG +b/t  Dialysis Orders: MWF [facility] 4 hrs   80kgs  3K/2.5Ca     No Heparin Profile 2 Micera 100 q 2 weeks- to start 8/24 Calcitriol 1.75  Assessment/Plan: 1.  Fever/HCAP- blood cultures pending. Vanc  and zosyn 2. AMS- no change to MRI. Neurology and ID consulted.  3. Cdiff- po Vanc 4. afib- coumadin on hold. Cont cardizem and metop. 5.  ESRD -  MWF [place], HD today. 3K bath 6.  Hypertension/volume  -110/62 daily HD for volume- as BP tolerated. Post wt of 77.9saturday /edw 80- needs lower edw.  7.  Anemia  - hgb 8.9- start ESA today. No Fe.  8.  Metabolic bone disease -  Last PTH 427 contcalcitriol. Hold renvela- last phos 2.9 9.  Nutrition - renal diet. supplements  [name], NP [employer] [phone] 12/10/2014, 11:05 AM   Pt seen, examined and agree w A/P as above. 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 Got better for a while, dc'd and now back with fevers and confusion again. MS never fully cleared up. From renal standpoint is volume overloaded due to significant weight loss. Plan HD today, may need extra HD while here.  Kelly Splinter MD pager [phone]    cell [phone] 12/10/2014, 4:45 PM

## 2014-12-10 NOTE — Clinical Documentation Improvement (Signed)
Hospitalist  Can the diagnosis of CHF be further specified?    Acuity - Acute, Chronic, Acute on Chronic   Type - Systolic, Diastolic, Systolic and Diastolic  Other  Clinically Undetermined  Document any associated diagnoses/conditions  Supporting Information:   ECHO 08/07/14 reveals EF of 50 to 82%, with RV systolic function moderately reduced, Pulmonary Artery(s) with systolic pressure mildly to moderately increased, moderate LVH  Being treated with PO Lopressor 50mg  twice daily.  Please exercise your independent, professional judgment when responding. A specific answer is not anticipated or expected.  Thank You,  [facility] [phone]

## 2014-12-10 NOTE — Progress Notes (Signed)
ANTICOAGULATION CONSULT NOTE - Initial Consult  Pharmacy Consult for Heparin when INR < 2 (warfarin on hold) Indication: atrial fibrillation  Allergies  Allergen Reactions  . Keflex [Cephalexin] Other (See Comments)    c-diff reaction  . Dilaudid [Hydromorphone] Nausea And Vomiting    Patient Measurements: Height: 6\' 2"  (188 cm) Weight: 181 lb (82.101 kg) IBW/kg (Calculated) : 82.2 Vital Signs: Temp: 98 F (36.7 C) (08/22 0153) Temp Source: Oral (08/22 0153) BP: 104/63 mmHg (08/22 0153) Pulse Rate: 106 (08/22 0153)  Labs:  Recent Labs  12/09/14 2030 12/09/14 2220 12/10/14 0238  HGB 8.6*  --  10.1*  HCT 26.4*  --  30.5*  PLT 157  --  167  LABPROT  --  30.1*  --   INR  --  2.93*  --   CREATININE 6.16*  --   --     Estimated Creatinine Clearance: 15 mL/min (by C-G formula based on Cr of 6.16).   Medical History: Past Medical History  Diagnosis Date  . Atrial fibrillation     a. 11/18/2011 s/p DCCV - 150J;  b. Anticoagulation w/ Apixaban;  c. 11/2011 back in afib->asymptomatic.  . H/O alcohol abuse     a. drinks heavily on the weekends.  . Hypertension   . Diabetes mellitus   . Heart murmur     a. 09/2011 Echo: EF 55-60%, Triv AI, Mild MR, mildly dil LA.  . Diabetic peripheral neuropathy   . CKD (chronic kidney disease), stage III   . CHF (congestive heart failure)   . Pneumonia   . Sleep apnea     Assessment: Currently holding warfarin some mild hemoptysis, to start heparin when INR <2. INR is 2.93 on admit.   Goal of Therapy:  INR 2-3 Monitor platelets by anticoagulation protocol: Yes   Plan:  -Heparin when INR <2  Narda Bonds 12/10/2014,3:11 AM

## 2014-12-10 NOTE — ED Notes (Signed)
Wife asking about pain ointment. Pharmacy called to get it dispensed prior to 1000

## 2014-12-10 NOTE — ED Notes (Signed)
Lab at the bedside 

## 2014-12-10 NOTE — ED Notes (Signed)
Given apple juice, peanut butter and graham crackers.

## 2014-12-10 NOTE — Progress Notes (Addendum)
Patient returned from dialysis with HR 150-160's, SOB on room air, O 2 @ 2L placed sats up to 96%, EKG revealed afib w/ RVR, pt started on Cardizem gtt as ordered, bp 90/52, 87/56, Mid level notified orders to decrease cardizem to 70ml/hr and will add amiodarone gtt, pt alert and oriented, family at bedside, urinated 200 ml dark orange yellow urine with foul odor.

## 2014-12-10 NOTE — Progress Notes (Signed)
Physician notified: Buriev At: [phone]  Regarding: BP 92/62, NS @ 125. BLE pit edema 2+. planned HD today. AF 80-100s. Cardizem, Lopressor ordered--hold?  Awaiting return response.   Order(s): Hold cardizem and lopressor today.

## 2014-12-10 NOTE — ED Notes (Signed)
CBG 82 

## 2014-12-10 NOTE — Progress Notes (Signed)
@  2050 page [name] of [facility] regarding pt's SBPs remaining in 70s-90s and HR Afib 130s-170s with Cardizem @5mg /hr and Amio @60mg /hr-->Bolus order received. Will continue to monitor and assess

## 2014-12-10 NOTE — Progress Notes (Addendum)
Patient is seen & examined. Please see today's H&P for the details. 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 Encephalitis presented with fever, chills and admitted with sepsis, pneumonia. Assessment and plan as per H&P. We will cont IV atx, add PO vanc due to recent c diff, still has mild diarrhea. Monitor closely due to severe sepsis.  Critical care, neurology have been consulted earlier. I have also consulted nephrology evaluation for HD. We will hold lantus due to episodes of hypoglycemia  Patient is hemodynamically stable, but needs close monitoring due to severe sepsis. D/w patient, his wife.    Kinnie Feil  [facility] Pager [phone]. If 7PM-7AM, please contact night-coverage at [email or web address], password [facility] 12/10/2014, 8:18 AM  LOS: 1 day

## 2014-12-10 NOTE — Consult Note (Signed)
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  LP negative and rx with steroids.  Returned 8/21 with fever, cough and mild hemoptysis.  CXR and CT c/w LLL HCAP.  Coumadin held and PCCM consulted to assist.   STUDIES:  CT chest 8/22>>>LLL consolidation with air bronchograms.  Mild diffuse ground glass ?edema. Small R effusion.  MRI brain 8/22>>>Persistent abnormal T2/FLAIR signal intensity involving the mesial temporal lobes/hippocampi bilaterally, with question of trace diffusion abnormality within the right hippocampus. While these findings appear overall improved relative to previous MRI from 11/21/2014, the T2/FLAIR signal abnormality appears slightly worsened relative to prior MRI from 11/26/2014 (although the diffusion changes are improved from that exam as well). 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 As before, herpes encephalitis could also have this appearance.   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  LP negative and rx with steroids.  Returned 8/21 with fever, cough and mild hemoptysis.  CXR and CT c/w LLL HCAP.  Coumadin held and PCCM consulted to assist.   Per wife pt has had cough/fever x2 weeks ever since he was d/c.  Some blood tinged sputum but no overt hemoptysis.  Remains confused.  C/o mild  orthopnea and BLE edema.  Denies chest pain, other obvious bleeding.   PAST MEDICAL HISTORY :   has a past medical history of Atrial fibrillation; H/O alcohol abuse; Hypertension; Diabetes mellitus; Heart murmur; Diabetic peripheral neuropathy; CKD (chronic kidney disease), stage III; CHF (congestive heart failure); Pneumonia; and Sleep apnea.  has past surgical history that includes eye sx (11/11/2011); Cardioversion (11/18/2011); Colonoscopy w/ biopsies and polypectomy; AV fistula placement (Left, 02/13/2014); and right heart catheterization (N/A, 10/11/2013). Prior to Admission medications   Medication Sig Start Date End Date Taking? Authorizing Provider  atorvastatin (LIPITOR) 40 MG tablet Take 40 mg by mouth at bedtime.    Yes Historical Provider, MD  calcitRIOL (ROCALTROL) 0.25 MCG capsule Take 7 capsules (1.75 mcg total) by mouth every Monday, Wednesday, and Friday with hemodialysis. 12/02/14  Yes Thurnell Lose, MD  colchicine 0.6 MG tablet TAKE 1 TABLET (0.6 MG TOTAL) BY MOUTH 2 (TWO) TIMES DAILY. 08/14/14  Yes Jearld Fenton, NP  diltiazem (CARDIZEM CD) 120 MG 24 hr capsule Take 1 capsule (120 mg total) by mouth daily. 03/03/14  Yes Donne Hazel, MD  ethyl chloride spray Apply 1 application topically every other day. Mon Wed Fri - done with dialysis 05/12/14  Yes Historical Provider, MD  fluocinonide ointment (LIDEX) 0.05 % Apply topically 4 (four) times daily. 12/02/14  Yes Thurnell Lose, MD  hydrocerin (EUCERIN) CREA Apply 1 application topically 4 (four) times daily. 12/02/14  Yes Thurnell Lose, MD  insulin aspart (NOVOLOG) 100 UNIT/ML injection Before each meal 3 times a day, 140-199 - 2 units, 200-250 - 4 units, 251-299 - 6 units,  300-349 - 8 units,  350 or above 10 units. 12/02/14  Yes Thurnell Lose, MD  Insulin Glargine (LANTUS SOLOSTAR) 100 UNIT/ML Solostar Pen Inject 20 Units into the skin 2 (two) times daily. 12/02/14  Yes Thurnell Lose, MD  levocetirizine (XYZAL) 5 MG  tablet Take 5 mg by mouth every evening.   Yes Historical Provider, MD  metoprolol tartrate (LOPRESSOR) 50 MG tablet Take 1 tablet (50 mg total) by mouth 2 (two) times daily. 12/02/14  Yes Thurnell Lose, MD  Nutritional Supplements (FEEDING SUPPLEMENT, NEPRO CARB STEADY,) LIQD Take 237 mLs by mouth 2 (two) times daily between meals. 11/09/14  Yes Kelvin Cellar, MD  ondansetron (ZOFRAN) 4 MG tablet Take 4 mg by mouth 3 (three) times daily as needed for nausea or vomiting.   Yes Historical Provider, MD  pantoprazole (PROTONIX) 20 MG tablet Take 1 tablet (20 mg total) by mouth 2 (two) times daily. 12/02/14  Yes Thurnell Lose, MD  phenytoin (DILANTIN) 300 MG ER capsule Take 1 capsule (300 mg total) by mouth at bedtime. Patient taking differently: Take 300 mg by mouth every morning.  12/02/14  Yes Thurnell Lose, MD  saccharomyces boulardii (FLORASTOR) 250 MG capsule Take 1 capsule (250 mg total) by mouth 2 (two) times daily. Patient taking differently: Take 250 mg by mouth daily.  11/09/14  Yes Kelvin Cellar, MD  sevelamer carbonate (RENVELA) 800 MG tablet Take 2 tablets (1,600 mg total) by mouth 3 (three) times daily with meals. 12/02/14  Yes Thurnell Lose, MD  warfarin (COUMADIN) 5 MG tablet Take 1 tablet (5 mg total) by mouth daily. 03/03/14  Yes Donne Hazel, MD  enoxaparin (LOVENOX) 150 MG/ML injection Inject 0.55 mLs (85 mg total) into the skin daily. Stopped once INR reaches 2. 12/02/14   Thurnell Lose, MD   Allergies  Allergen Reactions  . Keflex [Cephalexin] Other (See Comments)    c-diff reaction  . Dilaudid [Hydromorphone] Nausea And Vomiting    FAMILY HISTORY:  indicated that his mother is alive. He indicated that his father is alive. He indicated that his sister is deceased.  SOCIAL HISTORY:  reports that he quit smoking about 20 years ago. His smoking use included Cigarettes. He has a .05 pack-year smoking history. He has never used smokeless tobacco. He reports that he  does not drink alcohol or use illicit drugs.  REVIEW OF SYSTEMS:  As per HPI obtained from pt and wife - All other systems reviewed and were neg.    SUBJECTIVE:   VITAL SIGNS: Temp:  [98 F (36.7 C)-103.4 F (39.7 C)] 100.6 F (38.1 C) (08/22 0426) Pulse Rate:  [62-149] 149 (08/22 0557) Resp:  [17-38] 27 (08/22 0615) BP: (90-127)/(48-73) 110/62 mmHg (08/22 0615) SpO2:  [79 %-100 %] 100 % (08/22 0615) Weight:  [181 lb (82.101 kg)] 181 lb (82.101 kg) (08/21 2003)    INTAKE / OUTPUT:  Intake/Output Summary (Last 24 hours) at 12/10/14 1002 Last data filed at 12/09/14 2317  Gross per 24 hour  Intake   1550 ml  Output      0 ml  Net   1550 ml    PHYSICAL EXAMINATION: General:  Pleasant chronically ill appearing male, NAD sitting on side of bed  Neuro:  Awake, alert, cooperative, follows commands, seems appropriate at times, intermittently vonfused HEENT:  Mm moist, no JVD  Cardiovascular:  s1s2 irreg, AFib 120  Lungs:  resps even non labored on , LLL crackles  Abdomen:  Round, soft, non tender  Musculoskeletal:  Warm and dry, 1+ BLE edema    LABS:  CBC  Recent Labs Lab 12/09/14 2030 12/10/14 0238 12/10/14 0440  WBC 14.8* 14.3* 16.1*  HGB 8.6* 10.1* 8.9*  HCT 26.4* 30.5* 27.7*  PLT 157 167 177   Coag's  Recent Labs Lab 12/09/14 2220 12/10/14 0238  INR 2.93* 3.36*   BMET  Recent Labs Lab 12/09/14 2030 12/10/14 0440  NA 133* 135  K 3.7 3.6  CL 95* 96*  CO2 25 25  BUN 35* 37*  CREATININE 6.16* 6.35*  GLUCOSE 69 38*   Electrolytes  Recent Labs Lab 12/09/14 2030 12/10/14 0440  CALCIUM 8.1* 8.1*   Sepsis Markers  Recent Labs Lab 12/09/14 2039 12/09/14 2353 12/10/14 0440  LATICACIDVEN 1.95 1.37  --   PROCALCITON  --   --  22.33   ABG No results for input(s): PHART, PCO2ART, PO2ART in the last 168 hours. Liver Enzymes  Recent Labs Lab 12/09/14 2030 12/10/14 0440  AST 70* 62*  ALT 55 55  ALKPHOS 280* 274*  BILITOT 1.3* 1.9*   ALBUMIN 1.9* 1.9*   Cardiac Enzymes  Recent Labs Lab 12/10/14 0440  TROPONINI 0.10*   Glucose  Recent Labs Lab 12/10/14 0536 12/10/14 0630  GLUCAP 29* 82    Imaging Ct Abdomen Pelvis Wo Contrast  12/10/2014   CLINICAL DATA:  Hematemesis. Fever. Altered mental status. Recent hospitalization for acute encephalopathy, hospital acquired pneumonia and positive c. diff PCR.  EXAM: CT CHEST, ABDOMEN AND PELVIS WITHOUT CONTRAST  TECHNIQUE: Multidetector CT imaging of the chest, abdomen and pelvis was performed following the standard protocol without IV contrast.  COMPARISON:  CT 11/27/2014  FINDINGS: CT CHEST FINDINGS  New from prior exam confluent and ground-glass airspace opacity in the left lower lobe, with air bronchograms. More diffuse ground-glass opacity throughout all lobes of both lungs, also new from prior. The tree in bud opacities in the right upper lobe are similar. Small right pleural effusion, unchanged from prior exam. There is a diminutive left pleural effusion that is new. No pericardial effusion.  The heart size is normal. Coronary artery calcifications are seen. Progressive mediastinal adenopathy, for example left lower paratracheal lymph node measures 1.7 cm in short axis dimension. There prominent mildly enlarged upper paratracheal and prevascular lymph nodes. Limited assessment for hilar adenopathy given lack of contrast. Thoracic aorta is normal in caliber with mild atherosclerosis.  CT ABDOMEN AND PELVIS FINDINGS  Small volume of perihepatic ascites, diminished in volume from prior exam. The liver appears prominent in size measuring 19.2 cm craniocaudal dimension. No focal hepatic lesion allowing for lack contrast. The gallbladder is decompressed with gallbladder wall thickening. The spleen is normal in size. The adrenal glands are normal. Mild bilateral renal parenchymal atrophy, no hydronephrosis.  Questionable minimal haziness about the head of the pancreas. No  peripancreatic fluid collection.  The stomach is decompressed. No small bowel thickening. Fluid within the cecum and proximal ascending colon without colonic wall thickening. Remainder the colon is decompressed.  No retroperitoneal adenopathy. Abdominal aorta is normal in caliber. Moderate atherosclerosis without aneurysm.  Within the pelvis there is a small volume of ascites. Bladder is minimally distended with equivocal bladder wall thickening. Prostate gland is normal in size. Atherosclerosis noted of the inguinal vasculature. Small fat containing umbilical hernia. Soft tissue densities in the anterior abdominal wall, may be related to injections.  Review of the osseous structures of the chest abdomen and pelvis demonstrates no acute osseous abnormality. No suspicious osseous lesion.  IMPRESSION: 1. Development of confluent airspace disease with air bronchograms in the left lower lobe. Additionally development of mild diffuse ground-glass opacity throughout all lobes of both lungs. This diffuse appearance may be infectious or inflammatory in etiology or represent pulmonary edema. Left lower lobe findings favor aspiration or pneumonia, which may be a separate process than the more diffuse ground-glass opacities. 2. Small right pleural effusion, not significantly changed. Development of diminutive left pleural effusion that may be reactive. New mediastinal adenopathy which is likely reactive. 3. Persistent but decreased perihepatic ascites. Liver appears prominent in size. Decompressed gallbladder with gallbladder wall thickening. Constellation of findings suggests chronic liver disease. 4. Questionable edema about the head of the pancreas. This may be related to third spacing/ascites versus pancreatic inflammation. 5. Minimal fluid in the cecum and ascending colon without associated colonic wall thickening.   Electronically Signed   By: Jeb Levering M.D.   On: 12/10/2014 01:58   Dg Chest 2 View  12/09/2014    CLINICAL DATA:  Altered mental status. Weakness. Shortness of breath. Rash on the back of the trauma for 1 day. Fever and cough. History of hypertension and diabetes. Nonsmoker.  EXAM: CHEST  2 VIEW  COMPARISON:  12/01/2014  FINDINGS: Focal airspace infiltration in the left lower lung posteriorly with possible blunting of the left costophrenic angle. Changes may represent focal pneumonia. Borderline heart size without significant vascular congestion. No pneumothorax. Mediastinal contours appear intact. Degenerative changes in the shoulders and spine.  IMPRESSION: Focal infiltration in the left lung base with blunting of left costophrenic angle likely due to pneumonia.   Electronically Signed   By: Lucienne Capers M.D.   On: 12/09/2014 22:08   Ct Head Wo Contrast  12/10/2014   CLINICAL DATA:  Hematemesis. Altered mental status. Acute encephalopathy. Recent hospitalization for acute encephalopathy, complicated by hospital acquired pneumonia and positive C difficile.  EXAM: CT HEAD WITHOUT CONTRAST  TECHNIQUE: Contiguous axial images were obtained from the base of the skull through the vertex without intravenous contrast.  COMPARISON:  MRI brain 11/26/2014.  CT head 11/18/2014.  FINDINGS: Mild diffuse cerebral atrophy. No ventricular dilatation. Old area of encephalomalacia in the left posterior occipital lobe. No mass effect or midline shift. No abnormal extra-axial fluid collections. Gray-white matter junctions are distinct. Basal cisterns are not effaced. No evidence of acute intracranial hemorrhage. No depressed skull fractures. Visualized paranasal sinuses and mastoid air cells are not opacified. Vascular calcifications.  IMPRESSION: No acute intracranial abnormalities. Focal encephalomalacia in the left posterior occipital lobe is unchanged since prior study.   Electronically Signed   By: Lucienne Capers M.D.   On: 12/10/2014 01:42   Ct Chest Wo Contrast  12/10/2014   CLINICAL DATA:  Hematemesis. Fever.  Altered mental status. Recent hospitalization for acute encephalopathy, hospital acquired pneumonia and positive c. diff PCR.  EXAM: CT CHEST, ABDOMEN AND PELVIS WITHOUT CONTRAST  TECHNIQUE: Multidetector CT imaging of the chest, abdomen and pelvis was performed following the standard protocol without IV contrast.  COMPARISON:  CT 11/27/2014  FINDINGS: CT CHEST FINDINGS  New from prior exam confluent and ground-glass airspace opacity in the left lower lobe, with air bronchograms. More diffuse ground-glass opacity throughout all lobes of both lungs, also new from prior. The tree in bud opacities in the right upper lobe are similar. Small right pleural effusion, unchanged from prior exam. There is a diminutive left pleural effusion that is new. No pericardial effusion.  The heart size is normal. Coronary artery calcifications are  seen. Progressive mediastinal adenopathy, for example left lower paratracheal lymph node measures 1.7 cm in short axis dimension. There prominent mildly enlarged upper paratracheal and prevascular lymph nodes. Limited assessment for hilar adenopathy given lack of contrast. Thoracic aorta is normal in caliber with mild atherosclerosis.  CT ABDOMEN AND PELVIS FINDINGS  Small volume of perihepatic ascites, diminished in volume from prior exam. The liver appears prominent in size measuring 19.2 cm craniocaudal dimension. No focal hepatic lesion allowing for lack contrast. The gallbladder is decompressed with gallbladder wall thickening. The spleen is normal in size. The adrenal glands are normal. Mild bilateral renal parenchymal atrophy, no hydronephrosis.  Questionable minimal haziness about the head of the pancreas. No peripancreatic fluid collection.  The stomach is decompressed. No small bowel thickening. Fluid within the cecum and proximal ascending colon without colonic wall thickening. Remainder the colon is decompressed.  No retroperitoneal adenopathy. Abdominal aorta is normal in  caliber. Moderate atherosclerosis without aneurysm.  Within the pelvis there is a small volume of ascites. Bladder is minimally distended with equivocal bladder wall thickening. Prostate gland is normal in size. Atherosclerosis noted of the inguinal vasculature. Small fat containing umbilical hernia. Soft tissue densities in the anterior abdominal wall, may be related to injections.  Review of the osseous structures of the chest abdomen and pelvis demonstrates no acute osseous abnormality. No suspicious osseous lesion.  IMPRESSION: 1. Development of confluent airspace disease with air bronchograms in the left lower lobe. Additionally development of mild diffuse ground-glass opacity throughout all lobes of both lungs. This diffuse appearance may be infectious or inflammatory in etiology or represent pulmonary edema. Left lower lobe findings favor aspiration or pneumonia, which may be a separate process than the more diffuse ground-glass opacities. 2. Small right pleural effusion, not significantly changed. Development of diminutive left pleural effusion that may be reactive. New mediastinal adenopathy which is likely reactive. 3. Persistent but decreased perihepatic ascites. Liver appears prominent in size. Decompressed gallbladder with gallbladder wall thickening. Constellation of findings suggests chronic liver disease. 4. Questionable edema about the head of the pancreas. This may be related to third spacing/ascites versus pancreatic inflammation. 5. Minimal fluid in the cecum and ascending colon without associated colonic wall thickening.   Electronically Signed   By: Jeb Levering M.D.   On: 12/10/2014 01:58   Mr Brain Wo Contrast  12/10/2014   CLINICAL DATA:  Initial evaluation for acute encephalopathy.  EXAM: MRI HEAD WITHOUT CONTRAST  TECHNIQUE: Multiplanar, multiecho pulse sequences of the brain and surrounding structures were obtained without intravenous contrast.  COMPARISON:  Prior CT from earlier  the same day as well as previous MRI from 11/26/2014.  FINDINGS: Diffuse prominence of the CSF containing spaces is compatible with generalized cerebral atrophy. Patchy and confluent T2/FLAIR hyperintensity within the periventricular and deep white matter both cerebral hemispheres again seen, most consistent with chronic small vessel ischemic disease. 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  No abnormal foci of restricted diffusion to suggest acute intracranial infarct. Gray-white matter differentiation maintained. Normal intravascular flow voids are preserved. No acute or chronic intracranial hemorrhage.  There is mildly hyperintense T2/FLAIR signal intensity involving the mesial temporal lobes/hippocampi bilaterally, similar relative to the initial MRI from 11/21/2014, but worsened relative to subsequent MRI from 11/26/2014. There is question of trace associated restricted diffusion within knee right hippocampus (series 7, image 17). No other significant diffusion-weighted signal abnormality.  No mass lesion, midline shift, or mass effect. No hydrocephalus.  No extra-axial fluid collection.  Craniocervical junction within normal limits. Pituitary gland normal.  No acute abnormality about the orbits.  Paranasal sinuses are clear. Mild scattered opacity present within the mastoid air cells bilaterally, slightly greater on the right. Inner ear structures grossly normal.  Bone marrow signal intensity within normal limits. Scalp soft tissues unremarkable.  IMPRESSION: 1. Persistent abnormal T2/FLAIR signal intensity involving the mesial temporal lobes/hippocampi bilaterally, with question of trace diffusion abnormality within the right hippocampus. While these findings appear overall improved relative to previous MRI from 11/21/2014, the T2/FLAIR signal abnormality appears slightly worsened relative to prior MRI from 11/26/2014 (although the diffusion  changes are improved from that exam as well). 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 As before, herpes encephalitis could also have this appearance. 2. No other acute intracranial process. 3. 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   Electronically Signed   By: Jeannine Boga M.D.   On: 12/10/2014 05:50     ASSESSMENT / PLAN:  Hemoptysis  HCAP  Small R pleural effusion  Hx OSA  P:   Broad spectrum abx for HCAP coverage - Vancomycin/Zosyn  Hold coumadin for now  F/u INR  May need volume removal if BP will tolerate - baseline MWF HD Supplemental O2 as needed to keep sats >92%   Chronic AFib on coumadin  HTN  SIRS/sepsis  P:  Cont hold coumadin for now  Monitor INR  HR control - continue PO cardizem and metoprolol  If hemoptysis improved, resume coumadin when INR <3 Hold further volume  F/u lactate, pct  SCD's    Limbic encephalitis  Confusion  Hx seizure  P:  Neuro to see  No sig change MRI brian  Continue dilantin  Consider further ID input - seen recently by [facility] last admit   Recurrent CDiff  P:  Stool for CDiff pending  Cont PO vanc  Probiotic     FAMILY  - Updates: wife updated 8/22 at bedside.   - Inter-disciplinary family meet or Palliative Care meeting due by:  8/29     Nickolas Madrid, NP 12/10/2014  10:02 AM Pager: [phone] or [phone]

## 2014-12-10 NOTE — ED Notes (Signed)
Dr. Hal Hope at bedside with patient and family.

## 2014-12-10 NOTE — H&P (Addendum)
[facility] History and Physical  Joseph [name] GDJ:[id number] DOB: 1955/09/09 DOA: 12/09/2014  Referring physician: Mr. Cathi Roan. PCP: Webb Silversmith, NP  Specialists: Nephrologist.  Chief Complaint: Fever and cough.  History obtained from patient's wife.  HPI: Joseph [name] is a 59 y.o. male with history of ESRD on hemodialysis, diabetes mellitus, chronic atrial fibrillation, gout who was recently admitted for encephalopathy and MRI showed limbic encephalitis and patient had undergone lumbar puncture and labs were largely unremarkable at that time patient was initially placed on IV steroids which was discontinued and patient also was having C. difficile colitis at the time was brought to the ER after patient was found to have persistent fever which worsened yesterday. Patient also has benign productive cough which was mildly bloody and patient also started developing nausea and vomiting with diarrhea. On exam patient not in acute distress. Initially patient was mildly hypotensive for which ER physician had given 1 L fluid bolus. Following the patient's heart rate and blood pressure improved. Patient on exam is alert awake and oriented to his name and is able to recognize his family and he knows where he is. Patient's family feels that patient is still not back to his baseline since his last admission. Patient's abdomen appears benign on exam.  Review of Systems: As presented in the history of presenting illness, rest negative.  Past Medical History  Diagnosis Date  . Atrial fibrillation     a. 11/18/2011 s/p DCCV - 150J;  b. Anticoagulation w/ Apixaban;  c. 11/2011 back in afib->asymptomatic.  . H/O alcohol abuse     a. drinks heavily on the weekends.  . Hypertension   . Diabetes mellitus   . Heart murmur     a. 09/2011 Echo: EF 55-60%, Triv AI, Mild MR, mildly dil LA.  . Diabetic peripheral neuropathy   . CKD (chronic kidney disease), stage III   . CHF (congestive heart failure)   .  Pneumonia   . Sleep apnea    Past Surgical History  Procedure Laterality Date  . Eye sx  11/11/2011    left eye  . Cardioversion  11/18/2011    Procedure: CARDIOVERSION;  Surgeon: Josue Hector, MD;  Location: [facility] ENDOSCOPY;  Service: Cardiovascular;  Laterality: N/A;  . Colonoscopy w/ biopsies and polypectomy    . Av fistula placement Left 02/13/2014    Procedure: INSERTION OF ARTERIOVENOUS (AV) GORE-TEX GRAFT ARM;  Surgeon: Angelia Mould, MD;  Location: [facility];  Service: Vascular;  Laterality: Left;  . Right heart catheterization N/A 10/11/2013    Procedure: RIGHT HEART CATH;  Surgeon: Larey Dresser, MD;  Location: [facility] CATH LAB;  Service: Cardiovascular;  Laterality: N/A;   Social History:  reports that he quit smoking about 20 years ago. His smoking use included Cigarettes. He has a .05 pack-year smoking history. He has never used smokeless tobacco. He reports that he does not drink alcohol or use illicit drugs. Where does patient live rehabilitation. Can patient participate in ADLs? Not sure.  Allergies  Allergen Reactions  . Keflex [Cephalexin] Other (See Comments)    c-diff reaction  . Dilaudid [Hydromorphone] Nausea And Vomiting    Family History:  Family History  Problem Relation Age of Onset  . Heart disease Mother     before age 35  . Diabetes Father   . Diabetes Sister   . Peripheral vascular disease Sister     amputation  . Cancer Neg Hx   . Stroke Neg Hx  Prior to Admission medications   Medication Sig Start Date End Date Taking? Authorizing Provider  atorvastatin (LIPITOR) 40 MG tablet Take 40 mg by mouth at bedtime.    Yes Historical Provider, MD  calcitRIOL (ROCALTROL) 0.25 MCG capsule Take 7 capsules (1.75 mcg total) by mouth every Monday, Wednesday, and Friday with hemodialysis. 12/02/14  Yes Thurnell Lose, MD  colchicine 0.6 MG tablet TAKE 1 TABLET (0.6 MG TOTAL) BY MOUTH 2 (TWO) TIMES DAILY. 08/14/14  Yes Jearld Fenton, NP  diltiazem  (CARDIZEM CD) 120 MG 24 hr capsule Take 1 capsule (120 mg total) by mouth daily. 03/03/14  Yes Donne Hazel, MD  ethyl chloride spray Apply 1 application topically every other day. Mon Wed Fri - done with dialysis 05/12/14  Yes Historical Provider, MD  fluocinonide ointment (LIDEX) 0.05 % Apply topically 4 (four) times daily. 12/02/14  Yes Thurnell Lose, MD  hydrocerin (EUCERIN) CREA Apply 1 application topically 4 (four) times daily. 12/02/14  Yes Thurnell Lose, MD  insulin aspart (NOVOLOG) 100 UNIT/ML injection Before each meal 3 times a day, 140-199 - 2 units, 200-250 - 4 units, 251-299 - 6 units,  300-349 - 8 units,  350 or above 10 units. 12/02/14  Yes Thurnell Lose, MD  Insulin Glargine (LANTUS SOLOSTAR) 100 UNIT/ML Solostar Pen Inject 20 Units into the skin 2 (two) times daily. 12/02/14  Yes Thurnell Lose, MD  levocetirizine (XYZAL) 5 MG tablet Take 5 mg by mouth every evening.   Yes Historical Provider, MD  metoprolol tartrate (LOPRESSOR) 50 MG tablet Take 1 tablet (50 mg total) by mouth 2 (two) times daily. 12/02/14  Yes Thurnell Lose, MD  Nutritional Supplements (FEEDING SUPPLEMENT, NEPRO CARB STEADY,) LIQD Take 237 mLs by mouth 2 (two) times daily between meals. 11/09/14  Yes Kelvin Cellar, MD  ondansetron (ZOFRAN) 4 MG tablet Take 4 mg by mouth 3 (three) times daily as needed for nausea or vomiting.   Yes Historical Provider, MD  pantoprazole (PROTONIX) 20 MG tablet Take 1 tablet (20 mg total) by mouth 2 (two) times daily. 12/02/14  Yes Thurnell Lose, MD  phenytoin (DILANTIN) 300 MG ER capsule Take 1 capsule (300 mg total) by mouth at bedtime. Patient taking differently: Take 300 mg by mouth every morning.  12/02/14  Yes Thurnell Lose, MD  saccharomyces boulardii (FLORASTOR) 250 MG capsule Take 1 capsule (250 mg total) by mouth 2 (two) times daily. Patient taking differently: Take 250 mg by mouth daily.  11/09/14  Yes Kelvin Cellar, MD  sevelamer carbonate (RENVELA) 800 MG  tablet Take 2 tablets (1,600 mg total) by mouth 3 (three) times daily with meals. 12/02/14  Yes Thurnell Lose, MD  warfarin (COUMADIN) 5 MG tablet Take 1 tablet (5 mg total) by mouth daily. 03/03/14  Yes Donne Hazel, MD  enoxaparin (LOVENOX) 150 MG/ML injection Inject 0.55 mLs (85 mg total) into the skin daily. Stopped once INR reaches 2. 12/02/14   Thurnell Lose, MD    Physical Exam: Filed Vitals:   12/09/14 2315 12/10/14 0000 12/10/14 0152 12/10/14 0153  BP: 98/49 123/62  104/63  Pulse: 99 110  106  Temp:   98 F (36.7 C) 98 F (36.7 C)  TempSrc:    Oral  Resp: 23 30  24   Height:      Weight:      SpO2: 97% 97%  98%     General:  Moderately built and nourished.  Eyes:  Anicteric no pallor.  ENT: No discharge from the ears eyes nose and mouth.  Neck: No mass felt.  Cardiovascular: S1 and S2 heard.  Respiratory: No rhonchi or crepitations.  Abdomen: Soft nontender bowel sounds present. No guarding or rigidity.  Skin: No erythema.  Musculoskeletal: No edema.  Psychiatric: Patient alert awake and oriented to his name.  Neurologic: Alert awake oriented to his name and place and recognizes family. Moves all extremities.  Labs on Admission:  Basic Metabolic Panel:  Recent Labs Lab 12/09/14 2030  NA 133*  K 3.7  CL 95*  CO2 25  GLUCOSE 69  BUN 35*  CREATININE 6.16*  CALCIUM 8.1*   Liver Function Tests:  Recent Labs Lab 12/09/14 2030  AST 70*  ALT 55  ALKPHOS 280*  BILITOT 1.3*  PROT 5.7*  ALBUMIN 1.9*   No results for input(s): LIPASE, AMYLASE in the last 168 hours. No results for input(s): AMMONIA in the last 168 hours. CBC:  Recent Labs Lab 12/09/14 2030  WBC 14.8*  NEUTROABS 12.7*  HGB 8.6*  HCT 26.4*  MCV 96.4  PLT 157   Cardiac Enzymes: No results for input(s): CKTOTAL, CKMB, CKMBINDEX, TROPONINI in the last 168 hours.  BNP (last 3 results)  Recent Labs  11/05/14 2205  BNP 3626.3*    ProBNP (last 3 results)  Recent  Labs  02/20/14 1515 02/24/14 1145 11/15/14 1622  PROBNP 15468.0* 10410.0* >4940.0*    CBG: No results for input(s): GLUCAP in the last 168 hours.  Radiological Exams on Admission: Ct Abdomen Pelvis Wo Contrast  12/10/2014   CLINICAL DATA:  Hematemesis. Fever. Altered mental status. Recent hospitalization for acute encephalopathy, hospital acquired pneumonia and positive c. diff PCR.  EXAM: CT CHEST, ABDOMEN AND PELVIS WITHOUT CONTRAST  TECHNIQUE: Multidetector CT imaging of the chest, abdomen and pelvis was performed following the standard protocol without IV contrast.  COMPARISON:  CT 11/27/2014  FINDINGS: CT CHEST FINDINGS  New from prior exam confluent and ground-glass airspace opacity in the left lower lobe, with air bronchograms. More diffuse ground-glass opacity throughout all lobes of both lungs, also new from prior. The tree in bud opacities in the right upper lobe are similar. Small right pleural effusion, unchanged from prior exam. There is a diminutive left pleural effusion that is new. No pericardial effusion.  The heart size is normal. Coronary artery calcifications are seen. Progressive mediastinal adenopathy, for example left lower paratracheal lymph node measures 1.7 cm in short axis dimension. There prominent mildly enlarged upper paratracheal and prevascular lymph nodes. Limited assessment for hilar adenopathy given lack of contrast. Thoracic aorta is normal in caliber with mild atherosclerosis.  CT ABDOMEN AND PELVIS FINDINGS  Small volume of perihepatic ascites, diminished in volume from prior exam. The liver appears prominent in size measuring 19.2 cm craniocaudal dimension. No focal hepatic lesion allowing for lack contrast. The gallbladder is decompressed with gallbladder wall thickening. The spleen is normal in size. The adrenal glands are normal. Mild bilateral renal parenchymal atrophy, no hydronephrosis.  Questionable minimal haziness about the head of the pancreas. No  peripancreatic fluid collection.  The stomach is decompressed. No small bowel thickening. Fluid within the cecum and proximal ascending colon without colonic wall thickening. Remainder the colon is decompressed.  No retroperitoneal adenopathy. Abdominal aorta is normal in caliber. Moderate atherosclerosis without aneurysm.  Within the pelvis there is a small volume of ascites. Bladder is minimally distended with equivocal bladder wall thickening. Prostate gland is normal in size. Atherosclerosis noted  of the inguinal vasculature. Small fat containing umbilical hernia. Soft tissue densities in the anterior abdominal wall, may be related to injections.  Review of the osseous structures of the chest abdomen and pelvis demonstrates no acute osseous abnormality. No suspicious osseous lesion.  IMPRESSION: 1. Development of confluent airspace disease with air bronchograms in the left lower lobe. Additionally development of mild diffuse ground-glass opacity throughout all lobes of both lungs. This diffuse appearance may be infectious or inflammatory in etiology or represent pulmonary edema. Left lower lobe findings favor aspiration or pneumonia, which may be a separate process than the more diffuse ground-glass opacities. 2. Small right pleural effusion, not significantly changed. Development of diminutive left pleural effusion that may be reactive. New mediastinal adenopathy which is likely reactive. 3. Persistent but decreased perihepatic ascites. Liver appears prominent in size. Decompressed gallbladder with gallbladder wall thickening. Constellation of findings suggests chronic liver disease. 4. Questionable edema about the head of the pancreas. This may be related to third spacing/ascites versus pancreatic inflammation. 5. Minimal fluid in the cecum and ascending colon without associated colonic wall thickening.   Electronically Signed   By: Jeb Levering M.D.   On: 12/10/2014 01:58   Dg Chest 2 View  12/09/2014    CLINICAL DATA:  Altered mental status. Weakness. Shortness of breath. Rash on the back of the trauma for 1 day. Fever and cough. History of hypertension and diabetes. Nonsmoker.  EXAM: CHEST  2 VIEW  COMPARISON:  12/01/2014  FINDINGS: Focal airspace infiltration in the left lower lung posteriorly with possible blunting of the left costophrenic angle. Changes may represent focal pneumonia. Borderline heart size without significant vascular congestion. No pneumothorax. Mediastinal contours appear intact. Degenerative changes in the shoulders and spine.  IMPRESSION: Focal infiltration in the left lung base with blunting of left costophrenic angle likely due to pneumonia.   Electronically Signed   By: Lucienne Capers M.D.   On: 12/09/2014 22:08   Ct Head Wo Contrast  12/10/2014   CLINICAL DATA:  Hematemesis. Altered mental status. Acute encephalopathy. Recent hospitalization for acute encephalopathy, complicated by hospital acquired pneumonia and positive C difficile.  EXAM: CT HEAD WITHOUT CONTRAST  TECHNIQUE: Contiguous axial images were obtained from the base of the skull through the vertex without intravenous contrast.  COMPARISON:  MRI brain 11/26/2014.  CT head 11/18/2014.  FINDINGS: Mild diffuse cerebral atrophy. No ventricular dilatation. Old area of encephalomalacia in the left posterior occipital lobe. No mass effect or midline shift. No abnormal extra-axial fluid collections. Gray-white matter junctions are distinct. Basal cisterns are not effaced. No evidence of acute intracranial hemorrhage. No depressed skull fractures. Visualized paranasal sinuses and mastoid air cells are not opacified. Vascular calcifications.  IMPRESSION: No acute intracranial abnormalities. Focal encephalomalacia in the left posterior occipital lobe is unchanged since prior study.   Electronically Signed   By: Lucienne Capers M.D.   On: 12/10/2014 01:42   Ct Chest Wo Contrast  12/10/2014   CLINICAL DATA:  Hematemesis. Fever.  Altered mental status. Recent hospitalization for acute encephalopathy, hospital acquired pneumonia and positive c. diff PCR.  EXAM: CT CHEST, ABDOMEN AND PELVIS WITHOUT CONTRAST  TECHNIQUE: Multidetector CT imaging of the chest, abdomen and pelvis was performed following the standard protocol without IV contrast.  COMPARISON:  CT 11/27/2014  FINDINGS: CT CHEST FINDINGS  New from prior exam confluent and ground-glass airspace opacity in the left lower lobe, with air bronchograms. More diffuse ground-glass opacity throughout all lobes of both lungs, also new from  prior. The tree in bud opacities in the right upper lobe are similar. Small right pleural effusion, unchanged from prior exam. There is a diminutive left pleural effusion that is new. No pericardial effusion.  The heart size is normal. Coronary artery calcifications are seen. Progressive mediastinal adenopathy, for example left lower paratracheal lymph node measures 1.7 cm in short axis dimension. There prominent mildly enlarged upper paratracheal and prevascular lymph nodes. Limited assessment for hilar adenopathy given lack of contrast. Thoracic aorta is normal in caliber with mild atherosclerosis.  CT ABDOMEN AND PELVIS FINDINGS  Small volume of perihepatic ascites, diminished in volume from prior exam. The liver appears prominent in size measuring 19.2 cm craniocaudal dimension. No focal hepatic lesion allowing for lack contrast. The gallbladder is decompressed with gallbladder wall thickening. The spleen is normal in size. The adrenal glands are normal. Mild bilateral renal parenchymal atrophy, no hydronephrosis.  Questionable minimal haziness about the head of the pancreas. No peripancreatic fluid collection.  The stomach is decompressed. No small bowel thickening. Fluid within the cecum and proximal ascending colon without colonic wall thickening. Remainder the colon is decompressed.  No retroperitoneal adenopathy. Abdominal aorta is normal in  caliber. Moderate atherosclerosis without aneurysm.  Within the pelvis there is a small volume of ascites. Bladder is minimally distended with equivocal bladder wall thickening. Prostate gland is normal in size. Atherosclerosis noted of the inguinal vasculature. Small fat containing umbilical hernia. Soft tissue densities in the anterior abdominal wall, may be related to injections.  Review of the osseous structures of the chest abdomen and pelvis demonstrates no acute osseous abnormality. No suspicious osseous lesion.  IMPRESSION: 1. Development of confluent airspace disease with air bronchograms in the left lower lobe. Additionally development of mild diffuse ground-glass opacity throughout all lobes of both lungs. This diffuse appearance may be infectious or inflammatory in etiology or represent pulmonary edema. Left lower lobe findings favor aspiration or pneumonia, which may be a separate process than the more diffuse ground-glass opacities. 2. Small right pleural effusion, not significantly changed. Development of diminutive left pleural effusion that may be reactive. New mediastinal adenopathy which is likely reactive. 3. Persistent but decreased perihepatic ascites. Liver appears prominent in size. Decompressed gallbladder with gallbladder wall thickening. Constellation of findings suggests chronic liver disease. 4. Questionable edema about the head of the pancreas. This may be related to third spacing/ascites versus pancreatic inflammation. 5. Minimal fluid in the cecum and ascending colon without associated colonic wall thickening.   Electronically Signed   By: Jeb Levering M.D.   On: 12/10/2014 01:58    EKG: Independently reviewed. A. fib with a heart rate around 1 20 bpm.  Assessment/Plan Principal Problem:   Sepsis Active Problems:   Atrial fibrillation-permanent   Essential hypertension   Gout   HCAP (healthcare-associated pneumonia)   ESRD on hemodialysis   Nausea vomiting and  diarrhea   Pneumonia   1. Sepsis from pneumonia - CT scan shows air bronchogram. Patient has been placed on vancomycin and Zosyn. Follow blood cultures. Patient has past following evaluation. Patient does have mild hemoptysis and patient is on Coumadin and for now have changed to IV heparin and we will follow CBC and respiratory status and will discuss with pulmonologist. 2. Nausea vomiting and diarrhea - patient during recent admission was positive for C. difficile. Check stool for C. difficile. CT abdomen pelvis shows nonspecific ascites and swelling around the pancreas for which I have ordered a lipase. Abdomen appears benign on exam. 3. Recent  admission for encephalopathy limbic encephalitis and I have discussed with on-call neurologist Dr. Aram Beecham since patient's family states that patient is still not back to his baseline. CT head is unremarkable. Dr. Aram Beecham has advised MRI brain. 4. Seizure - recent EEG last admission showed seizure activities and patient was on Dilantin. Check Dilantin levels. Continue Dilantin. 5. Chronic atrial fibrillation - chance to vasc score is 2. Patient is on Coumadin and at this time I have placed patient on heparin as patient has some hemoptysis and in case patient needs procedures. Continue rate limiting medications. 6. ESRD on hemodialysis on Monday Wednesday and Friday - patient did receive IV fluid in the ER 1 L bolus we will hold off any further fluids and closely monitor. Dialysis per nephrologist. 7. Hypertension - mildly hypotensive initially and after fluid bolus improved. Closely observe. 8. Diabetes mellitus type 2 - continue Lantus with sliding scale coverage. 9. History of gout on colchicine.  I have reviewed patient's old charts. I have discussed with on-call neurologist. I have reviewed patient's chest x-ray and EKG personally.  Addendum - patient continue to have hemoptysis. I have discussed with on-call pulmonary critical care. They will be  seeing patient in consult. At this time after discussing the patient's wife about the risks of stopping anticoagulation and risk for stroke patients wife is agreeable to stop anticoagulation and reverse with vitamin K. I have discussed with pharmacy in place patient on vitamin K 5 mg IV 1 time dose. Recheck INR. Closely monitor.    DVT Prophylaxis initially placed on heparin but discontinued due to persistent hemoptysis. Change to SCDs. Code Status: Full code.  Family Communication: Discussed with family.  Disposition Plan: Admit to inpatient.    Kalani Sthilaire N. [facility] Pager [phone].  If 7PM-7AM, please contact night-coverage [email or web address] Password [facility] 12/10/2014, 2:32 AM

## 2014-12-11 ENCOUNTER — Ambulatory Visit: Payer: Medicare Other | Admitting: Neurology

## 2014-12-11 DIAGNOSIS — R042 Hemoptysis: Secondary | ICD-10-CM

## 2014-12-11 DIAGNOSIS — I4891 Unspecified atrial fibrillation: Secondary | ICD-10-CM | POA: Diagnosis present

## 2014-12-11 DIAGNOSIS — G9340 Encephalopathy, unspecified: Secondary | ICD-10-CM | POA: Insufficient documentation

## 2014-12-11 LAB — BASIC METABOLIC PANEL
Anion gap: 12 (ref 5–15)
BUN: 29 mg/dL — ABNORMAL HIGH (ref 6–20)
CHLORIDE: 97 mmol/L — AB (ref 101–111)
CO2: 24 mmol/L (ref 22–32)
Calcium: 7.7 mg/dL — ABNORMAL LOW (ref 8.9–10.3)
Creatinine, Ser: 5.02 mg/dL — ABNORMAL HIGH (ref 0.61–1.24)
GFR calc non Af Amer: 11 mL/min — ABNORMAL LOW (ref >60)
GFR, EST AFRICAN AMERICAN: 13 mL/min — AB (ref >60)
Glucose, Bld: 223 mg/dL — ABNORMAL HIGH (ref 65–99)
POTASSIUM: 4.2 mmol/L (ref 3.5–5.1)
SODIUM: 133 mmol/L — AB (ref 135–145)

## 2014-12-11 LAB — GLUCOSE, CAPILLARY
GLUCOSE-CAPILLARY: 261 mg/dL — AB (ref 65–99)
Glucose-Capillary: 190 mg/dL — ABNORMAL HIGH (ref 65–99)
Glucose-Capillary: 234 mg/dL — ABNORMAL HIGH (ref 65–99)
Glucose-Capillary: 250 mg/dL — ABNORMAL HIGH (ref 65–99)

## 2014-12-11 LAB — CBC
HEMATOCRIT: 27.2 % — AB (ref 39.0–52.0)
HEMOGLOBIN: 8.7 g/dL — AB (ref 13.0–17.0)
MCH: 30.7 pg (ref 26.0–34.0)
MCHC: 32 g/dL (ref 30.0–36.0)
MCV: 96.1 fL (ref 78.0–100.0)
Platelets: 189 10*3/uL (ref 150–400)
RBC: 2.83 MIL/uL — AB (ref 4.22–5.81)
RDW: 18.8 % — ABNORMAL HIGH (ref 11.5–15.5)
WBC: 15.9 10*3/uL — ABNORMAL HIGH (ref 4.0–10.5)

## 2014-12-11 LAB — PROTIME-INR
INR: 1.65 — AB (ref 0.00–1.49)
PROTHROMBIN TIME: 19.5 s — AB (ref 11.6–15.2)

## 2014-12-11 MED ORDER — BENZONATATE 100 MG PO CAPS
200.0000 mg | ORAL_CAPSULE | Freq: Three times a day (TID) | ORAL | Status: DC | PRN
  Administered 2014-12-11 – 2014-12-20 (×11): 200 mg via ORAL
  Filled 2014-12-11 (×13): qty 2

## 2014-12-11 MED ORDER — VANCOMYCIN HCL IN DEXTROSE 1-5 GM/200ML-% IV SOLN
1000.0000 mg | Freq: Once | INTRAVENOUS | Status: AC
  Administered 2014-12-11: 1000 mg via INTRAVENOUS
  Filled 2014-12-11 (×2): qty 200

## 2014-12-11 NOTE — Evaluation (Signed)
Occupational Therapy Evaluation Patient Details Name: Joseph [name] MRN: [id number] DOB: 1956/04/10 Today's Date: 12/11/2014    History of Present Illness 59 yo male readmission with fever cough HCAP, anemia, and MRI abnormal t2-flair involving temporal lobes/ hippocami bilaterally with question of trace diffusion abnormally within R hippocampus. MRI reveals remote L PCA infarct. Pt with recent d/c 11/30/14 PMH: CDIFF, limbic encephalitis, ESRD, DM, CHF, CKD, PNA, HTN, cardioversion, AV fistula L forearm, R heart catheterization   Clinical Impression   PT admitted with fever cough HCAP ESRD, and encephalitis. Pt currently with functional limitiations due to the deficits listed below (see OT problem list). PTA at SNF with recent d/c from [facility] at min (A) level per therapy notes. Family denies return to SNF at this time. Pt will d/c home with all needed DME and aides available. Wife has FMLA and family support to (A) patient.  Pt will benefit from skilled OT to increase their independence and safety with adls and balance to allow discharge CIR.      Follow Up Recommendations  CIR (family denying SNF- pt will d/c home if not admitted to CIR)    Equipment Recommendations  Other (comment) (TBA)    Recommendations for Other Services       Precautions / Restrictions Precautions Precautions: Fall Precaution Comments: oxygen needs, multiple lines/ leads Restrictions Weight Bearing Restrictions: No      Mobility Bed Mobility               General bed mobility comments: in chair on arrival  Transfers Overall transfer level: Needs assistance   Transfers: Sit to/from Stand Sit to Stand: Max assist         General transfer comment: requires use of hands    Balance   Sitting-balance support: No upper extremity supported;Feet supported Sitting balance-Leahy Scale: Good                                      ADL Overall ADL's : Needs assistance/impaired      Grooming: Wash/dry face;Wash/dry hands;Applying deodorant;Min guard;Sitting Grooming Details (indicate cue type and reason): requires rest breaks and cues for pursed lip breathing. HR reaching 118  Upper Body Bathing: Minimal assitance;Sitting Upper Body Bathing Details (indicate cue type and reason): requires rest breaks Lower Body Bathing: Moderate assistance;Sit to/from stand   Upper Body Dressing : Moderate assistance;Sitting       Toilet Transfer: Maximal assistance;BSC (requires use of hands)             General ADL Comments: Pt in chair on arrival and transfered in chair to sink level. Pt remained seated for bathing session for energy conservation and to monitor HR closely. HR reaches max 118 during session. pt sit<>Stand only for peri care. Pt has x3 lotions/ creams for back rash that wife provided.Pt has vasculine for buttock . wife and patient educated on pressure relief while in chair     Vision     Perception     Praxis      Pertinent Vitals/Pain Pain Assessment: No/denies pain     Hand Dominance Right   Extremity/Trunk Assessment Upper Extremity Assessment Upper Extremity Assessment: Generalized weakness   Lower Extremity Assessment Lower Extremity Assessment: Defer to PT evaluation   Cervical / Trunk Assessment Cervical / Trunk Assessment: Kyphotic (skin rash noted- needs cotton gown)   Communication Communication Communication: No difficulties   Cognition Arousal/Alertness: Awake/alert  Behavior During Therapy: WFL for tasks assessed/performed Overall Cognitive Status: Impaired/Different from baseline Area of Impairment: Orientation;Memory;Following commands;Safety/judgement;Awareness Orientation Level: Disoriented to;Place;Situation (I know I am in [place]) Current Attention Level: Selective Memory: Decreased recall of precautions;Decreased short-term memory Following Commands: Follows one step commands with increased time;Follows multi-step  commands inconsistently Safety/Judgement: Decreased awareness of safety;Decreased awareness of deficits Awareness: Intellectual Problem Solving: Slow processing;Decreased initiation General Comments: "i am a little confused" - Pt able to state location as [facility] month as August 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 Pt however reports the year is 2015, unknown type of building or reason for admission.    General Comments       Exercises       Shoulder Instructions      Home Living Family/patient expects to be discharged to:: Private residence Living Arrangements: Spouse/significant other Available Help at Discharge: Family Type of Home: House Home Access: Level entry     Ada: One level     Bathroom Shower/Tub: Teacher, early years/pre: [place] - single point;Walker - 2 wheels          Prior Functioning/Environment Level of Independence: Needs assistance    ADL's / Homemaking Assistance Needed: [facility] 11/30/14 d/c to [place] with (A) for adls        OT Diagnosis: Generalized weakness;Cognitive deficits   OT Problem List: Decreased strength;Decreased range of motion;Decreased activity tolerance;Impaired balance (sitting and/or standing);Decreased cognition;Decreased safety awareness;Decreased knowledge of use of DME or AE;Decreased knowledge of precautions   OT Treatment/Interventions: Self-care/ADL training;Therapeutic exercise;Neuromuscular education;Energy conservation;DME and/or AE instruction;Therapeutic activities;Cognitive remediation/compensation;Patient/family education;Balance training    OT Goals(Current goals can be found in the care plan section) Acute Rehab OT Goals Patient Stated Goal: "we are not going back there"-  OT Goal Formulation: With patient/family Time For Goal Achievement: 12/25/14 Potential to Achieve Goals: Good  OT Frequency: Min 3X/week   Barriers to D/C:            Co-evaluation               End of Session Nurse Communication: Mobility status;Precautions  Activity Tolerance: Patient tolerated treatment well Patient left: in chair;with call bell/phone within reach;with family/visitor present   Time: 1020-1108 OT Time Calculation (min): 48 min Charges:  OT General Charges $OT Visit: 1 Procedure OT Evaluation $Initial OT Evaluation Tier I: 1 Procedure OT Treatments $Self Care/Home Management : 23-37 mins G-Codes: OT G-codes **NOT FOR INPATIENT CLASS** Functional Assessment Tool Used: clinical judgement Functional Limitation: Self care Self Care Current Status (G9211): At least 40 percent but less than 60 percent impaired, limited or restricted Self Care Goal Status (H4174): At least 1 percent but less than 20 percent impaired, limited or restricted  Peri Maris 12/11/2014, 11:28 AM  Jeri Modena   OTR/L Pager: [phone] Office: [phone] .

## 2014-12-11 NOTE — Consult Note (Signed)
PULMONARY / CRITICAL CARE MEDICINE   Name: Joseph [name] MRN: [id number] DOB: [birth year]    ADMISSION DATE:  12/09/2014 CONSULTATION DATE:  12/10/2014  REFERRING MD :  Daleen Bo ([place])   CHIEF COMPLAINT:  Sepsis, hemoptysis   INITIAL PRESENTATION:  59 yo male with fever, cough and mild hemoptysis from LLL HCAP.  Has hx of A fib on coumadin.  Had recent admission (7/31 to 8/14) for encephalopathy with limbic encephalitis.  Has hx of HTN, DM, ESRD, recurrent C diff.  STUDIES:  8/22 CT chest >> LLL ASD with air bronchograms  SIGNIFICANT EVENTS: 8/21 Admit  SUBJECTIVE:  Still has cough.  Not as much congestion.  Has not coughed blood since yesterday.  VITAL SIGNS: Temp:  [98.1 F (36.7 C)-100.2 F (37.9 C)] 98.9 F (37.2 C) (08/23 0730) Pulse Rate:  [48-185] 88 (08/23 0730) Resp:  [9-40] 26 (08/23 0730) BP: (81-112)/(43-79) 103/60 mmHg (08/23 0730) SpO2:  [95 %-100 %] 100 % (08/23 0730) Weight:  [172 lb 9.9 oz (78.3 kg)-183 lb 3.2 oz (83.1 kg)] 183 lb 3.2 oz (83.1 kg) (08/22 1500)  INTAKE / OUTPUT:  Intake/Output Summary (Last 24 hours) at 12/11/14 0819 Last data filed at 12/11/14 0700  Gross per 24 hour  Intake 1123.41 ml  Output   1481 ml  Net -357.59 ml    PHYSICAL EXAMINATION: General: sitting in chair Neuro: follows commands HEENT: no sinus tenderness Cardiovascular: irregular Lungs: decreased BS at Lt base, no wheeze Abdomen: soft, non tender Musculoskeletal: 1+ edema   LABS:  CBC  Recent Labs Lab 12/10/14 1616 12/10/14 2100 12/11/14 0250  WBC 15.2* 15.6* 15.9*  HGB 8.4* 9.2* 8.7*  HCT 26.1* 28.6* 27.2*  PLT 168 182 189   Coag's  Recent Labs Lab 12/10/14 0238 12/10/14 1104 12/11/14 0250  INR 3.36* 2.97* 1.65*   BMET  Recent Labs Lab 12/10/14 1616 12/10/14 2100 12/11/14 0250  NA 130* 134* 133*  K 3.8 4.2 4.2  CL 96* 97* 97*  CO2 23 23 24   BUN 41* 26* 29*  CREATININE 6.62* 4.64* 5.02*  GLUCOSE 221* 196* 223*    Electrolytes  Recent Labs Lab 12/10/14 1616 12/10/14 2100 12/11/14 0250  CALCIUM 7.7* 7.6* 7.7*  PHOS 3.1 2.6  --    Sepsis Markers  Recent Labs Lab 12/09/14 2039 12/09/14 2353 12/10/14 0440  LATICACIDVEN 1.95 1.37  --   PROCALCITON  --   --  22.33   Liver Enzymes  Recent Labs Lab 12/09/14 2030 12/10/14 0440 12/10/14 1616 12/10/14 2100  AST 70* 62*  --   --   ALT 55 55  --   --   ALKPHOS 280* 274*  --   --   BILITOT 1.3* 1.9*  --   --   ALBUMIN 1.9* 1.9* 1.7* 1.7*   Cardiac Enzymes  Recent Labs Lab 12/10/14 0440  TROPONINI 0.10*   Glucose  Recent Labs Lab 12/10/14 0536 12/10/14 0630 12/10/14 1017 12/10/14 1237 12/10/14 2112  GLUCAP 29* 82 135* 162* 186*    Imaging Ct Abdomen Pelvis Wo Contrast  12/10/2014   CLINICAL DATA:  Hematemesis. Fever. Altered mental status. Recent hospitalization for acute encephalopathy, hospital acquired pneumonia and positive c. diff PCR.  EXAM: CT CHEST, ABDOMEN AND PELVIS WITHOUT CONTRAST  TECHNIQUE: Multidetector CT imaging of the chest, abdomen and pelvis was performed following the standard protocol without IV contrast.  COMPARISON:  CT 11/27/2014  FINDINGS: CT CHEST FINDINGS  New from prior exam confluent and ground-glass airspace opacity in  the left lower lobe, with air bronchograms. More diffuse ground-glass opacity throughout all lobes of both lungs, also new from prior. The tree in bud opacities in the right upper lobe are similar. Small right pleural effusion, unchanged from prior exam. There is a diminutive left pleural effusion that is new. No pericardial effusion.  The heart size is normal. Coronary artery calcifications are seen. Progressive mediastinal adenopathy, for example left lower paratracheal lymph node measures 1.7 cm in short axis dimension. There prominent mildly enlarged upper paratracheal and prevascular lymph nodes. Limited assessment for hilar adenopathy given lack of contrast. Thoracic aorta is  normal in caliber with mild atherosclerosis.  CT ABDOMEN AND PELVIS FINDINGS  Small volume of perihepatic ascites, diminished in volume from prior exam. The liver appears prominent in size measuring 19.2 cm craniocaudal dimension. No focal hepatic lesion allowing for lack contrast. The gallbladder is decompressed with gallbladder wall thickening. The spleen is normal in size. The adrenal glands are normal. Mild bilateral renal parenchymal atrophy, no hydronephrosis.  Questionable minimal haziness about the head of the pancreas. No peripancreatic fluid collection.  The stomach is decompressed. No small bowel thickening. Fluid within the cecum and proximal ascending colon without colonic wall thickening. Remainder the colon is decompressed.  No retroperitoneal adenopathy. Abdominal aorta is normal in caliber. Moderate atherosclerosis without aneurysm.  Within the pelvis there is a small volume of ascites. Bladder is minimally distended with equivocal bladder wall thickening. Prostate gland is normal in size. Atherosclerosis noted of the inguinal vasculature. Small fat containing umbilical hernia. Soft tissue densities in the anterior abdominal wall, may be related to injections.  Review of the osseous structures of the chest abdomen and pelvis demonstrates no acute osseous abnormality. No suspicious osseous lesion.  IMPRESSION: 1. Development of confluent airspace disease with air bronchograms in the left lower lobe. Additionally development of mild diffuse ground-glass opacity throughout all lobes of both lungs. This diffuse appearance may be infectious or inflammatory in etiology or represent pulmonary edema. Left lower lobe findings favor aspiration or pneumonia, which may be a separate process than the more diffuse ground-glass opacities. 2. Small right pleural effusion, not significantly changed. Development of diminutive left pleural effusion that may be reactive. New mediastinal adenopathy which is likely  reactive. 3. Persistent but decreased perihepatic ascites. Liver appears prominent in size. Decompressed gallbladder with gallbladder wall thickening. Constellation of findings suggests chronic liver disease. 4. Questionable edema about the head of the pancreas. This may be related to third spacing/ascites versus pancreatic inflammation. 5. Minimal fluid in the cecum and ascending colon without associated colonic wall thickening.   Electronically Signed   By: Jeb Levering M.D.   On: 12/10/2014 01:58   Dg Chest 2 View  12/09/2014   CLINICAL DATA:  Altered mental status. Weakness. Shortness of breath. Rash on the back of the trauma for 1 day. Fever and cough. History of hypertension and diabetes. Nonsmoker.  EXAM: CHEST  2 VIEW  COMPARISON:  12/01/2014  FINDINGS: Focal airspace infiltration in the left lower lung posteriorly with possible blunting of the left costophrenic angle. Changes may represent focal pneumonia. Borderline heart size without significant vascular congestion. No pneumothorax. Mediastinal contours appear intact. Degenerative changes in the shoulders and spine.  IMPRESSION: Focal infiltration in the left lung base with blunting of left costophrenic angle likely due to pneumonia.   Electronically Signed   By: Lucienne Capers M.D.   On: 12/09/2014 22:08   Ct Head Wo Contrast  12/10/2014   CLINICAL  DATA:  Hematemesis. Altered mental status. Acute encephalopathy. Recent hospitalization for acute encephalopathy, complicated by hospital acquired pneumonia and positive C difficile.  EXAM: CT HEAD WITHOUT CONTRAST  TECHNIQUE: Contiguous axial images were obtained from the base of the skull through the vertex without intravenous contrast.  COMPARISON:  MRI brain 11/26/2014.  CT head 11/18/2014.  FINDINGS: Mild diffuse cerebral atrophy. No ventricular dilatation. Old area of encephalomalacia in the left posterior occipital lobe. No mass effect or midline shift. No abnormal extra-axial fluid  collections. Gray-white matter junctions are distinct. Basal cisterns are not effaced. No evidence of acute intracranial hemorrhage. No depressed skull fractures. Visualized paranasal sinuses and mastoid air cells are not opacified. Vascular calcifications.  IMPRESSION: No acute intracranial abnormalities. Focal encephalomalacia in the left posterior occipital lobe is unchanged since prior study.   Electronically Signed   By: Lucienne Capers M.D.   On: 12/10/2014 01:42   Ct Chest Wo Contrast  12/10/2014   CLINICAL DATA:  Hematemesis. Fever. Altered mental status. Recent hospitalization for acute encephalopathy, hospital acquired pneumonia and positive c. diff PCR.  EXAM: CT CHEST, ABDOMEN AND PELVIS WITHOUT CONTRAST  TECHNIQUE: Multidetector CT imaging of the chest, abdomen and pelvis was performed following the standard protocol without IV contrast.  COMPARISON:  CT 11/27/2014  FINDINGS: CT CHEST FINDINGS  New from prior exam confluent and ground-glass airspace opacity in the left lower lobe, with air bronchograms. More diffuse ground-glass opacity throughout all lobes of both lungs, also new from prior. The tree in bud opacities in the right upper lobe are similar. Small right pleural effusion, unchanged from prior exam. There is a diminutive left pleural effusion that is new. No pericardial effusion.  The heart size is normal. Coronary artery calcifications are seen. Progressive mediastinal adenopathy, for example left lower paratracheal lymph node measures 1.7 cm in short axis dimension. There prominent mildly enlarged upper paratracheal and prevascular lymph nodes. Limited assessment for hilar adenopathy given lack of contrast. Thoracic aorta is normal in caliber with mild atherosclerosis.  CT ABDOMEN AND PELVIS FINDINGS  Small volume of perihepatic ascites, diminished in volume from prior exam. The liver appears prominent in size measuring 19.2 cm craniocaudal dimension. No focal hepatic lesion allowing  for lack contrast. The gallbladder is decompressed with gallbladder wall thickening. The spleen is normal in size. The adrenal glands are normal. Mild bilateral renal parenchymal atrophy, no hydronephrosis.  Questionable minimal haziness about the head of the pancreas. No peripancreatic fluid collection.  The stomach is decompressed. No small bowel thickening. Fluid within the cecum and proximal ascending colon without colonic wall thickening. Remainder the colon is decompressed.  No retroperitoneal adenopathy. Abdominal aorta is normal in caliber. Moderate atherosclerosis without aneurysm.  Within the pelvis there is a small volume of ascites. Bladder is minimally distended with equivocal bladder wall thickening. Prostate gland is normal in size. Atherosclerosis noted of the inguinal vasculature. Small fat containing umbilical hernia. Soft tissue densities in the anterior abdominal wall, may be related to injections.  Review of the osseous structures of the chest abdomen and pelvis demonstrates no acute osseous abnormality. No suspicious osseous lesion.  IMPRESSION: 1. Development of confluent airspace disease with air bronchograms in the left lower lobe. Additionally development of mild diffuse ground-glass opacity throughout all lobes of both lungs. This diffuse appearance may be infectious or inflammatory in etiology or represent pulmonary edema. Left lower lobe findings favor aspiration or pneumonia, which may be a separate process than the more diffuse ground-glass opacities. 2. Small  right pleural effusion, not significantly changed. Development of diminutive left pleural effusion that may be reactive. New mediastinal adenopathy which is likely reactive. 3. Persistent but decreased perihepatic ascites. Liver appears prominent in size. Decompressed gallbladder with gallbladder wall thickening. Constellation of findings suggests chronic liver disease. 4. Questionable edema about the head of the pancreas. This  may be related to third spacing/ascites versus pancreatic inflammation. 5. Minimal fluid in the cecum and ascending colon without associated colonic wall thickening.   Electronically Signed   By: Jeb Levering M.D.   On: 12/10/2014 01:58   Mr Brain Wo Contrast  12/10/2014   CLINICAL DATA:  Initial evaluation for acute encephalopathy.  EXAM: MRI HEAD WITHOUT CONTRAST  TECHNIQUE: Multiplanar, multiecho pulse sequences of the brain and surrounding structures were obtained without intravenous contrast.  COMPARISON:  Prior CT from earlier the same day as well as previous MRI from 11/26/2014.  FINDINGS: Diffuse prominence of the CSF containing spaces is compatible with generalized cerebral atrophy. Patchy and confluent T2/FLAIR hyperintensity within the periventricular and deep white matter both cerebral hemispheres again seen, most consistent with chronic small vessel ischemic disease. 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  No abnormal foci of restricted diffusion to suggest acute intracranial infarct. Gray-white matter differentiation maintained. Normal intravascular flow voids are preserved. No acute or chronic intracranial hemorrhage.  There is mildly hyperintense T2/FLAIR signal intensity involving the mesial temporal lobes/hippocampi bilaterally, similar relative to the initial MRI from 11/21/2014, but worsened relative to subsequent MRI from 11/26/2014. There is question of trace associated restricted diffusion within knee right hippocampus (series 7, image 17). No other significant diffusion-weighted signal abnormality.  No mass lesion, midline shift, or mass effect. No hydrocephalus. No extra-axial fluid collection.  Craniocervical junction within normal limits. Pituitary gland normal.  No acute abnormality about the orbits.  Paranasal sinuses are clear. Mild scattered opacity present within the mastoid air cells bilaterally, slightly  greater on the right. Inner ear structures grossly normal.  Bone marrow signal intensity within normal limits. Scalp soft tissues unremarkable.  IMPRESSION: 1. Persistent abnormal T2/FLAIR signal intensity involving the mesial temporal lobes/hippocampi bilaterally, with question of trace diffusion abnormality within the right hippocampus. While these findings appear overall improved relative to previous MRI from 11/21/2014, the T2/FLAIR signal abnormality appears slightly worsened relative to prior MRI from 11/26/2014 (although the diffusion changes are improved from that exam as well). 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 As before, herpes encephalitis could also have this appearance. 2. No other acute intracranial process. 3. 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   Electronically Signed   By: Jeannine Boga M.D.   On: 12/10/2014 05:50   CULTURES: Blood 8/21 >> C diff 8/22 >> Ag positive, toxin negative >> colonization   ASSESSMENT / PLAN:  Acute hypoxic respiratory failure 2nd to Lt lower lung HCAP with hemoptysis in setting of chronic coumadin therapy. Plan: - Day 2 of vancomycin, zosyn - f/u CXR - oxygen to keep SpO2 > 92% - f/u procalcitonin  Chronic a fib. Plan: - continue to hold coumadin for now >> might be able to resume on 8/24 if no further hemoptysis  Limbic encephalitis. Plan: - per primary team  Recurrent C diff >> Ag positive/toxin negative from 8/22 >> colonization Plan: - per primary team >> can likely d/c po vancomycin  ESRD. Plan: - per  renal  Updated pt's wife at bedside.  Chesley Mires, MD [facility] Pulmonary/Critical Care 12/11/2014, 8:40 AM Pager:  [phone] After 3pm call: [phone]

## 2014-12-11 NOTE — Progress Notes (Signed)
Patient in hemodialysis, UF off and heart rate continues to climb to the 180's in Afib. Dr Marval Regal notified and ordered to rinseback and notify admitting MD to follow-up

## 2014-12-11 NOTE — Progress Notes (Signed)
Patient in hemodialysis, HR increased to 130's-140's. Orvil Feil, NP on unit and notified taken out of UF to continue to monitor.

## 2014-12-11 NOTE — Progress Notes (Signed)
[facility] Progress Note   Subjective: alert, up in the chair, afib on monitor HR 90's  Filed Vitals:   12/11/14 0600 12/11/14 0700 12/11/14 0730 12/11/14 0830  BP: 94/67 97/70 103/60 94/65  Pulse: 91 104 88 98  Temp:   98.9 F (37.2 C)   TempSrc:   Oral   Resp: 25 22 26  38  Height:      Weight:      SpO2: 97% 100% 100% 99%   Exam: Alert, no distress, up in chair No jvd Faint rales L base o/w clear RRR no MRG Abd soft, no ascites or hsm No LE or UE edema L arm AVG +bruit Neuro is more alert and interactive than last admission  [facility] 4 hrs 80kgs 3K/2.5Ca No Heparin Profile 2 Micera 100 q 2 weeks- to start 8/24 Calcitriol 1.75      Assessment: 1. Fever/ HCAP - on abx 2. 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 Afib on iv dilt/ amio, dc MTP 4. ESRD HD wed 5. Volume - no edema today, below dry wt, resp status stable 6. Anemia starting esa 7. MBD low phos, cont vit D   Plan - HD Wed, minimal UF, dc MTP, abx    Rob Jonnie Finner MD  pager [phone]    cell [phone]  12/11/2014, 10:22 AM     Recent Labs Lab 12/10/14 1616 12/10/14 2100 12/11/14 0250  NA 130* 134* 133*  K 3.8 4.2 4.2  CL 96* 97* 97*  CO2 23 23 24   GLUCOSE 221* 196* 223*  BUN 41* 26* 29*  CREATININE 6.62* 4.64* 5.02*  CALCIUM 7.7* 7.6* 7.7*  PHOS 3.1 2.6  --     Recent Labs Lab 12/09/14 2030 12/10/14 0440 12/10/14 1616 12/10/14 2100  AST 70* 62*  --   --   ALT 55 55  --   --   ALKPHOS 280* 274*  --   --   BILITOT 1.3* 1.9*  --   --   PROT 5.7* 6.2*  --   --   ALBUMIN 1.9* 1.9* 1.7* 1.7*    Recent Labs Lab 12/09/14 2030  12/10/14 1616 12/10/14 2100 12/11/14 0250  WBC 14.8*  < > 15.2* 15.6* 15.9*  NEUTROABS 12.7*  --  13.0*  --   --   HGB 8.6*  < > 8.4* 9.2* 8.7*  HCT 26.4*  < > 26.1* 28.6* 27.2*  MCV 96.4  < > 94.9 96.3 96.1  PLT 157  < > 168 182 189  < > = values in this interval not displayed. Marland Kitchen atorvastatin  40 mg Oral QHS  . calcitRIOL  1.75 mcg Oral Q  M,W,F-HD  . colchicine  0.6 mg Oral Daily  . darbepoetin (ARANESP) injection - DIALYSIS  100 mcg Intravenous Q Mon-HD  . feeding supplement (NEPRO CARB STEADY)  237 mL Oral BID BM  . fluocinonide ointment   Topical QID  . insulin aspart  0-9 Units Subcutaneous TID WC  . metoprolol tartrate  50 mg Oral BID  . multivitamin  1 tablet Oral QHS  . phenytoin  300 mg Oral q morning - 10a  . piperacillin-tazobactam (ZOSYN)  IV  2.25 g Intravenous Q8H  . saccharomyces boulardii  250 mg Oral BID  . sodium chloride  3 mL Intravenous Q12H  . vancomycin  125 mg Oral 4 times per day  . vancomycin  1,000 mg Intravenous Q M,W,F-HD  . vancomycin  1,000 mg Intravenous Once   . amiodarone 30  mg/hr (12/11/14 4360)  . diltiazem (CARDIZEM) infusion 5 mg/hr (12/11/14 1012)   sodium chloride, sodium chloride, acetaminophen, alteplase, feeding supplement (NEPRO CARB STEADY), guaiFENesin-dextromethorphan, heparin, heparin, lidocaine (PF), lidocaine-prilocaine, pentafluoroprop-tetrafluoroeth

## 2014-12-11 NOTE — Evaluation (Signed)
Physical Therapy Evaluation Patient Details Name: Joseph [name] MRN: [id number] DOB: 31-Jul-1955 Today's Date: 12/11/2014   History of Present Illness  59 yo male readmission with fever cough HCAP, anemia, and MRI abnormal t2-flair involving temporal lobes/ hippocami bilaterally with question of trace diffusion abnormally within R hippocampus. MRI reveals remote L PCA infarct. Pt with recent d/c 11/30/14 PMH: CDIFF, limbic encephalitis, ESRD, DM, CHF, CKD, PNA, HTN, cardioversion, AV fistula L forearm, R heart catheterization  Clinical Impression  Pt admitted with/for PNA and encephalitis..  Pt currently limited functionally due to the problems listed. ( See problems list.)   Pt will benefit from PT to maximize function and safety in order to get ready for next venue listed below.     Follow Up Recommendations CIR    Equipment Recommendations  None recommended by PT    Recommendations for Other Services Rehab consult     Precautions / Restrictions Precautions Precautions: Fall Precaution Comments: oxygen needs, multiple lines/ leads      Mobility  Bed Mobility Overal bed mobility: Needs Assistance Bed Mobility: Sit to Supine     Supine to sit: Min guard     General bed mobility comments: used momentum to get to EOB  Transfers Overall transfer level: Needs assistance Equipment used: Rolling walker (2 wheeled) Transfers: Sit to/from [facility] Sit to Stand: Max assist Stand pivot transfers: Max assist       General transfer comment: requires use of hands; assisted to come forward and for lift  Ambulation/Gait                Stairs            Wheelchair Mobility    Modified Rankin (Stroke Patients Only)       Balance Overall balance assessment: Needs assistance Sitting-balance support: No upper extremity supported         Standing balance-Leahy Scale: Poor Standing balance comment: did 2 standing exercises                              Pertinent Vitals/Pain Pain Assessment: No/denies pain    Home Living Family/patient expects to be discharged to:: Private residence Living Arrangements: Spouse/significant other Available Help at Discharge: Family Type of Home: House Home Access: Level entry     Home Layout: One level Home Equipment: Cane - single point;Walker - 2 wheels      Prior Function Level of Independence: Needs assistance      ADL's / Homemaking Assistance Needed: mch 11/30/14 d/c to [place] with (A) for adls        Hand Dominance   Dominant Hand: Right    Extremity/Trunk Assessment   Upper Extremity Assessment: Defer to OT evaluation           Lower Extremity Assessment: Generalized weakness;RLE deficits/detail;LLE deficits/detail RLE Deficits / Details: grossly 3+/5, quads 4-/5 LLE Deficits / Details: grossly 3+/5  Cervical / Trunk Assessment: Kyphotic (skin rash noted- needs cotton gown)  Communication   Communication: No difficulties  Cognition Arousal/Alertness: Awake/alert Behavior During Therapy: WFL for tasks assessed/performed Overall Cognitive Status: Impaired/Different from baseline Area of Impairment: Orientation;Memory;Following commands;Safety/judgement;Awareness Orientation Level: Disoriented to;Place;Situation (I know I am in [place]) Current Attention Level: Selective Memory: Decreased recall of precautions;Decreased short-term memory Following Commands: Follows one step commands with increased time;Follows multi-step commands inconsistently Safety/Judgement: Decreased awareness of safety;Decreased awareness of deficits Awareness: Intellectual Problem Solving: Slow processing;Decreased initiation  General Comments General comments (skin integrity, edema, etc.): EHR in the 120's    Exercises General Exercises - Lower Extremity Hip Flexion/Marching: 10 reps;AROM;Standing Mini-Sqauts: AROM;10 reps;Standing       Assessment/Plan    PT Assessment Patient needs continued PT services  PT Diagnosis Difficulty walking;Generalized weakness   PT Problem List Decreased strength;Decreased activity tolerance;Decreased mobility;Decreased coordination;Decreased knowledge of use of DME  PT Treatment Interventions DME instruction;Gait training;Functional mobility training;Therapeutic activities;Therapeutic exercise;Balance training;Patient/family education   PT Goals (Current goals can be found in the Care Plan section) Acute Rehab PT Goals Patient Stated Goal: stronger then home PT Goal Formulation: With patient Time For Goal Achievement: 12/25/14 Potential to Achieve Goals: Good    Frequency Min 3X/week   Barriers to discharge        Co-evaluation               End of Session   Activity Tolerance: Patient limited by fatigue;Patient tolerated treatment well Patient left: in chair;with call bell/phone within reach Nurse Communication: Mobility status         Time: 2671-2458 PT Time Calculation (min) (ACUTE ONLY): 27 min   Charges:   PT Evaluation $Initial PT Evaluation Tier I: 1 Procedure PT Treatments $Therapeutic Activity: 8-22 mins   PT G Codes:        Lorriann Hansmann, Tessie Fass 12/11/2014, 4:15 PM 12/11/2014  Donnella Sham, PT [phone]  (pager)

## 2014-12-11 NOTE — Progress Notes (Signed)
Patient rinsedback after HR climb to 180's in Afib, MD notified .

## 2014-12-11 NOTE — Progress Notes (Signed)
Rehab Admissions Coordinator Note:  Patient was screened by Retta Diones for appropriateness for an Inpatient Acute Rehab Consult.  At this time, we are recommending Inpatient Rehab consult.  Retta Diones 12/11/2014, 1:34 PM  I can be reached at [phone].

## 2014-12-11 NOTE — Progress Notes (Signed)
[facility] PROGRESS NOTE  ARIE POWELL FHL:[id number] DOB: 26-Nov-1955 DOA: 12/09/2014 PCP: Webb Silversmith, NP   Brief narrative 59 year old male with history of end-stage renal disease on dialysis (M, W, F), chronic A. fib on Coumadin, gout, type 2 diabetes mellitus with recent hospitalization for encephalopathy with MRI showing them became cephalitis. During that time he had undergone LP which was unremarkable. He did respond to IV steroid initially which was discontinued as patient had C. difficile colitis. Patient brought to the ED as he was having persistent fever for one day associated with productive cough with some hemoptysis. He also reported having nausea and vomiting with diarrhea. On presentation patient was mildly hypotensive and received 1 L IV normal saline bolus in the ED following which his heart rate and blood pressure improved. Patient appeared to have some confusion and lethargy on presentation. Patient criteria for sepsis and imaging showed left-sided pneumonia. Patient placed on empiric antibiotics and admitted to stepdown.  Assessment/Plan: Sepsis secondary to healthcare associated pneumonia On empiric vancomycin and Zosyn. Follow blood cultures. Mild hemoptysis on presentation which has now resolved. H&H stable. Currently on IV heparin drip. I will resume his Coumadin tomorrow if no further symptoms. Patient currently maintaining O2 sat on 2 L via nasal cannula. Appreciate pulmonary evaluation and recommendations. -Follow blood culture.  Acute encephalopathy Possibly related to sepsis and has improved today. MRI on admission showing persistent abnormal T2/FLAIR signal intensity involving mesial temporal lobes/hippocampi bilaterally which seems to have improved overall compared to MRI from 11/21/2014. Continue neuro checks. If symptoms do not improve or worsen despite underlying infection resolved, will consult neurology.  Nausea vomiting with diarrhea Stool for C.  difficile antigen positive but toxin negative suggestive of colonization. Had one formed bowel movement this morning. I will discontinue his oral vancomycin. No further nausea or vomiting.  A. fib with RVR After returning from dialysis on 8/22. Was started on Cardizem drip but had low blood pressure so that it was reduced and started on amiodarone drip. Heart rate currently stable in the 90s. Blood pressure stable. Rapid A. fib triggered by underlying sepsis. Will titrate down the amiodarone drip. If not improved in the next 24 hours will consult cardiology. -Continue IV heparin. Plan to resume Coumadin from tomorrow if no further hemoptysis. Echo from 08/2014 showed LVH with EF of 50-55%  ESRD on hemodialysis Renal following. Had hemodialysis yesterday and falling status appears to be stable. Plan is for dialysis tomorrow.  History of seizure Continue Dilantin.  Diabetes mellitus Continue with sliding scale insulin.  Protein calorie malnutrition Continue supplements  Diet: Renal/heart healthy  DVT prophylaxis: On IV heparin  Code Status: Full code Family Communication: Wife at bedside Disposition Plan: Continue step down monitoring   Consultants:  Renal  Pulmonary  Procedures:  Dialysis  MRI brain  Antibiotics:  IV vancomycin and Zosyn since 8/22  HPI/Subjective: Patient seen and examined. Reports one formed BM this am. No further hemoptysis. Still having cough. Denies shortness of breath or chest discomfort. As per wife at bedside he appears more oriented today  Objective: Filed Vitals:   12/11/14 0830  BP: 94/65  Pulse: 98  Temp:   Resp: 38    Intake/Output Summary (Last 24 hours) at 12/11/14 1130 Last data filed at 12/11/14 0800  Gross per 24 hour  Intake 1243.41 ml  Output   1481 ml  Net -237.59 ml   Filed Weights   12/09/14 2003 12/10/14 0905 12/10/14 1500  Weight: 82.101 kg (181 lb)  78.3 kg (172 lb 9.9 oz) 83.1 kg (183 lb 3.2 oz)     Exam:   General:  Middle aged male in no acute distress  HEENT: No pallor, most oral mucosa, no JVD  Respiratory: Fine left basilar crackles, no rhonchi or wheeze  CVS: S1 and S2 is irregularly irregular, no murmurs rub or gallop  GI: Soft, nondistended, nontender, bowel sounds present  Musculoskeletal: Warm, no edema  CNS: Alert and oriented 2   Data Reviewed: Basic Metabolic Panel:  Recent Labs Lab 12/09/14 2030 12/10/14 0440 12/10/14 1616 12/10/14 2100 12/11/14 0250  NA 133* 135 130* 134* 133*  K 3.7 3.6 3.8 4.2 4.2  CL 95* 96* 96* 97* 97*  CO2 25 25 23 23 24   GLUCOSE 69 38* 221* 196* 223*  BUN 35* 37* 41* 26* 29*  CREATININE 6.16* 6.35* 6.62* 4.64* 5.02*  CALCIUM 8.1* 8.1* 7.7* 7.6* 7.7*  PHOS  --   --  3.1 2.6  --    Liver Function Tests:  Recent Labs Lab 12/09/14 2030 12/10/14 0440 12/10/14 1616 12/10/14 2100  AST 70* 62*  --   --   ALT 55 55  --   --   ALKPHOS 280* 274*  --   --   BILITOT 1.3* 1.9*  --   --   PROT 5.7* 6.2*  --   --   ALBUMIN 1.9* 1.9* 1.7* 1.7*    Recent Labs Lab 12/10/14 0440  LIPASE 38   No results for input(s): AMMONIA in the last 168 hours. CBC:  Recent Labs Lab 12/09/14 2030 12/10/14 0238 12/10/14 0440 12/10/14 1616 12/10/14 2100 12/11/14 0250  WBC 14.8* 14.3* 16.1* 15.2* 15.6* 15.9*  NEUTROABS 12.7*  --   --  13.0*  --   --   HGB 8.6* 10.1* 8.9* 8.4* 9.2* 8.7*  HCT 26.4* 30.5* 27.7* 26.1* 28.6* 27.2*  MCV 96.4 95.9 95.2 94.9 96.3 96.1  PLT 157 167 177 168 182 189   Cardiac Enzymes:  Recent Labs Lab 12/10/14 0440  TROPONINI 0.10*   BNP (last 3 results)  Recent Labs  11/05/14 2205  BNP 3626.3*    ProBNP (last 3 results)  Recent Labs  02/20/14 1515 02/24/14 1145 11/15/14 1622  PROBNP 15468.0* 10410.0* >4940.0*    CBG:  Recent Labs Lab 12/10/14 0630 12/10/14 1017 12/10/14 1237 12/10/14 2112 12/11/14 0725  GLUCAP 82 135* 162* 186* 190*    Recent Results (from the past 240  hour(s))  Blood Culture (routine x 2)     Status: None (Preliminary result)   Collection Time: 12/09/14  8:20 PM  Result Value Ref Range Status   Specimen Description BLOOD RIGHT WRIST  Final   Special Requests BOTTLES DRAWN AEROBIC AND ANAEROBIC 5CC  Final   Culture NO GROWTH < 24 HOURS  Final   Report Status PENDING  Incomplete  Blood Culture (routine x 2)     Status: None (Preliminary result)   Collection Time: 12/09/14  8:25 PM  Result Value Ref Range Status   Specimen Description BLOOD RIGHT HAND  Final   Special Requests BOTTLES DRAWN AEROBIC AND ANAEROBIC 4CC  Final   Culture NO GROWTH < 24 HOURS  Final   Report Status PENDING  Incomplete  C difficile quick scan w PCR reflex     Status: Abnormal   Collection Time: 12/10/14  4:42 AM  Result Value Ref Range Status   C Diff antigen POSITIVE (A) NEGATIVE Final   C Diff toxin NEGATIVE NEGATIVE  Final   C Diff interpretation   Final    C. difficile present, but toxin not detected. This indicates colonization. In most cases, this does not require treatment. If patient has signs and symptoms consistent with colitis, consider treatment.     Studies: Ct Abdomen Pelvis Wo Contrast  12/10/2014   CLINICAL DATA:  Hematemesis. Fever. Altered mental status. Recent hospitalization for acute encephalopathy, hospital acquired pneumonia and positive c. diff PCR.  EXAM: CT CHEST, ABDOMEN AND PELVIS WITHOUT CONTRAST  TECHNIQUE: Multidetector CT imaging of the chest, abdomen and pelvis was performed following the standard protocol without IV contrast.  COMPARISON:  CT 11/27/2014  FINDINGS: CT CHEST FINDINGS  New from prior exam confluent and ground-glass airspace opacity in the left lower lobe, with air bronchograms. More diffuse ground-glass opacity throughout all lobes of both lungs, also new from prior. The tree in bud opacities in the right upper lobe are similar. Small right pleural effusion, unchanged from prior exam. There is a diminutive left  pleural effusion that is new. No pericardial effusion.  The heart size is normal. Coronary artery calcifications are seen. Progressive mediastinal adenopathy, for example left lower paratracheal lymph node measures 1.7 cm in short axis dimension. There prominent mildly enlarged upper paratracheal and prevascular lymph nodes. Limited assessment for hilar adenopathy given lack of contrast. Thoracic aorta is normal in caliber with mild atherosclerosis.  CT ABDOMEN AND PELVIS FINDINGS  Small volume of perihepatic ascites, diminished in volume from prior exam. The liver appears prominent in size measuring 19.2 cm craniocaudal dimension. No focal hepatic lesion allowing for lack contrast. The gallbladder is decompressed with gallbladder wall thickening. The spleen is normal in size. The adrenal glands are normal. Mild bilateral renal parenchymal atrophy, no hydronephrosis.  Questionable minimal haziness about the head of the pancreas. No peripancreatic fluid collection.  The stomach is decompressed. No small bowel thickening. Fluid within the cecum and proximal ascending colon without colonic wall thickening. Remainder the colon is decompressed.  No retroperitoneal adenopathy. Abdominal aorta is normal in caliber. Moderate atherosclerosis without aneurysm.  Within the pelvis there is a small volume of ascites. Bladder is minimally distended with equivocal bladder wall thickening. Prostate gland is normal in size. Atherosclerosis noted of the inguinal vasculature. Small fat containing umbilical hernia. Soft tissue densities in the anterior abdominal wall, may be related to injections.  Review of the osseous structures of the chest abdomen and pelvis demonstrates no acute osseous abnormality. No suspicious osseous lesion.  IMPRESSION: 1. Development of confluent airspace disease with air bronchograms in the left lower lobe. Additionally development of mild diffuse ground-glass opacity throughout all lobes of both lungs.  This diffuse appearance may be infectious or inflammatory in etiology or represent pulmonary edema. Left lower lobe findings favor aspiration or pneumonia, which may be a separate process than the more diffuse ground-glass opacities. 2. Small right pleural effusion, not significantly changed. Development of diminutive left pleural effusion that may be reactive. New mediastinal adenopathy which is likely reactive. 3. Persistent but decreased perihepatic ascites. Liver appears prominent in size. Decompressed gallbladder with gallbladder wall thickening. Constellation of findings suggests chronic liver disease. 4. Questionable edema about the head of the pancreas. This may be related to third spacing/ascites versus pancreatic inflammation. 5. Minimal fluid in the cecum and ascending colon without associated colonic wall thickening.   Electronically Signed   By: Jeb Levering M.D.   On: 12/10/2014 01:58   Dg Chest 2 View  12/09/2014   CLINICAL DATA:  Altered mental status. Weakness. Shortness of breath. Rash on the back of the trauma for 1 day. Fever and cough. History of hypertension and diabetes. Nonsmoker.  EXAM: CHEST  2 VIEW  COMPARISON:  12/01/2014  FINDINGS: Focal airspace infiltration in the left lower lung posteriorly with possible blunting of the left costophrenic angle. Changes may represent focal pneumonia. Borderline heart size without significant vascular congestion. No pneumothorax. Mediastinal contours appear intact. Degenerative changes in the shoulders and spine.  IMPRESSION: Focal infiltration in the left lung base with blunting of left costophrenic angle likely due to pneumonia.   Electronically Signed   By: Lucienne Capers M.D.   On: 12/09/2014 22:08   Ct Head Wo Contrast  12/10/2014   CLINICAL DATA:  Hematemesis. Altered mental status. Acute encephalopathy. Recent hospitalization for acute encephalopathy, complicated by hospital acquired pneumonia and positive C difficile.  EXAM: CT HEAD  WITHOUT CONTRAST  TECHNIQUE: Contiguous axial images were obtained from the base of the skull through the vertex without intravenous contrast.  COMPARISON:  MRI brain 11/26/2014.  CT head 11/18/2014.  FINDINGS: Mild diffuse cerebral atrophy. No ventricular dilatation. Old area of encephalomalacia in the left posterior occipital lobe. No mass effect or midline shift. No abnormal extra-axial fluid collections. Gray-white matter junctions are distinct. Basal cisterns are not effaced. No evidence of acute intracranial hemorrhage. No depressed skull fractures. Visualized paranasal sinuses and mastoid air cells are not opacified. Vascular calcifications.  IMPRESSION: No acute intracranial abnormalities. Focal encephalomalacia in the left posterior occipital lobe is unchanged since prior study.   Electronically Signed   By: Lucienne Capers M.D.   On: 12/10/2014 01:42   Ct Chest Wo Contrast  12/10/2014   CLINICAL DATA:  Hematemesis. Fever. Altered mental status. Recent hospitalization for acute encephalopathy, hospital acquired pneumonia and positive c. diff PCR.  EXAM: CT CHEST, ABDOMEN AND PELVIS WITHOUT CONTRAST  TECHNIQUE: Multidetector CT imaging of the chest, abdomen and pelvis was performed following the standard protocol without IV contrast.  COMPARISON:  CT 11/27/2014  FINDINGS: CT CHEST FINDINGS  New from prior exam confluent and ground-glass airspace opacity in the left lower lobe, with air bronchograms. More diffuse ground-glass opacity throughout all lobes of both lungs, also new from prior. The tree in bud opacities in the right upper lobe are similar. Small right pleural effusion, unchanged from prior exam. There is a diminutive left pleural effusion that is new. No pericardial effusion.  The heart size is normal. Coronary artery calcifications are seen. Progressive mediastinal adenopathy, for example left lower paratracheal lymph node measures 1.7 cm in short axis dimension. There prominent mildly  enlarged upper paratracheal and prevascular lymph nodes. Limited assessment for hilar adenopathy given lack of contrast. Thoracic aorta is normal in caliber with mild atherosclerosis.  CT ABDOMEN AND PELVIS FINDINGS  Small volume of perihepatic ascites, diminished in volume from prior exam. The liver appears prominent in size measuring 19.2 cm craniocaudal dimension. No focal hepatic lesion allowing for lack contrast. The gallbladder is decompressed with gallbladder wall thickening. The spleen is normal in size. The adrenal glands are normal. Mild bilateral renal parenchymal atrophy, no hydronephrosis.  Questionable minimal haziness about the head of the pancreas. No peripancreatic fluid collection.  The stomach is decompressed. No small bowel thickening. Fluid within the cecum and proximal ascending colon without colonic wall thickening. Remainder the colon is decompressed.  No retroperitoneal adenopathy. Abdominal aorta is normal in caliber. Moderate atherosclerosis without aneurysm.  Within the pelvis there is a small volume of  ascites. Bladder is minimally distended with equivocal bladder wall thickening. Prostate gland is normal in size. Atherosclerosis noted of the inguinal vasculature. Small fat containing umbilical hernia. Soft tissue densities in the anterior abdominal wall, may be related to injections.  Review of the osseous structures of the chest abdomen and pelvis demonstrates no acute osseous abnormality. No suspicious osseous lesion.  IMPRESSION: 1. Development of confluent airspace disease with air bronchograms in the left lower lobe. Additionally development of mild diffuse ground-glass opacity throughout all lobes of both lungs. This diffuse appearance may be infectious or inflammatory in etiology or represent pulmonary edema. Left lower lobe findings favor aspiration or pneumonia, which may be a separate process than the more diffuse ground-glass opacities. 2. Small right pleural effusion, not  significantly changed. Development of diminutive left pleural effusion that may be reactive. New mediastinal adenopathy which is likely reactive. 3. Persistent but decreased perihepatic ascites. Liver appears prominent in size. Decompressed gallbladder with gallbladder wall thickening. Constellation of findings suggests chronic liver disease. 4. Questionable edema about the head of the pancreas. This may be related to third spacing/ascites versus pancreatic inflammation. 5. Minimal fluid in the cecum and ascending colon without associated colonic wall thickening.   Electronically Signed   By: Jeb Levering M.D.   On: 12/10/2014 01:58   Mr Brain Wo Contrast  12/10/2014   CLINICAL DATA:  Initial evaluation for acute encephalopathy.  EXAM: MRI HEAD WITHOUT CONTRAST  TECHNIQUE: Multiplanar, multiecho pulse sequences of the brain and surrounding structures were obtained without intravenous contrast.  COMPARISON:  Prior CT from earlier the same day as well as previous MRI from 11/26/2014.  FINDINGS: Diffuse prominence of the CSF containing spaces is compatible with generalized cerebral atrophy. Patchy and confluent T2/FLAIR hyperintensity within the periventricular and deep white matter both cerebral hemispheres again seen, most consistent with chronic small vessel ischemic disease. 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  No abnormal foci of restricted diffusion to suggest acute intracranial infarct. Gray-white matter differentiation maintained. Normal intravascular flow voids are preserved. No acute or chronic intracranial hemorrhage.  There is mildly hyperintense T2/FLAIR signal intensity involving the mesial temporal lobes/hippocampi bilaterally, similar relative to the initial MRI from 11/21/2014, but worsened relative to subsequent MRI from 11/26/2014. There is question of trace associated restricted diffusion within knee right hippocampus  (series 7, image 17). No other significant diffusion-weighted signal abnormality.  No mass lesion, midline shift, or mass effect. No hydrocephalus. No extra-axial fluid collection.  Craniocervical junction within normal limits. Pituitary gland normal.  No acute abnormality about the orbits.  Paranasal sinuses are clear. Mild scattered opacity present within the mastoid air cells bilaterally, slightly greater on the right. Inner ear structures grossly normal.  Bone marrow signal intensity within normal limits. Scalp soft tissues unremarkable.  IMPRESSION: 1. Persistent abnormal T2/FLAIR signal intensity involving the mesial temporal lobes/hippocampi bilaterally, with question of trace diffusion abnormality within the right hippocampus. While these findings appear overall improved relative to previous MRI from 11/21/2014, the T2/FLAIR signal abnormality appears slightly worsened relative to prior MRI from 11/26/2014 (although the diffusion changes are improved from that exam as well). 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 As before, herpes encephalitis could also have this appearance. 2. No other acute intracranial process. 3. 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   Electronically Signed   By: Jeannine Boga  M.D.   On: 12/10/2014 05:50    Scheduled Meds: . atorvastatin  40 mg Oral QHS  . calcitRIOL  1.75 mcg Oral Q M,W,F-HD  . colchicine  0.6 mg Oral Daily  . darbepoetin (ARANESP) injection - DIALYSIS  100 mcg Intravenous Q Mon-HD  . feeding supplement (NEPRO CARB STEADY)  237 mL Oral BID BM  . fluocinonide ointment   Topical QID  . insulin aspart  0-9 Units Subcutaneous TID WC  . multivitamin  1 tablet Oral QHS  . phenytoin  300 mg Oral q morning - 10a  . piperacillin-tazobactam (ZOSYN)  IV  2.25 g Intravenous Q8H  . saccharomyces boulardii  250 mg  Oral BID  . sodium chloride  3 mL Intravenous Q12H  . vancomycin  125 mg Oral 4 times per day  . vancomycin  1,000 mg Intravenous Q M,W,F-HD  . vancomycin  1,000 mg Intravenous Once   Continuous Infusions: . amiodarone 30 mg/hr (12/11/14 0649)  . diltiazem (CARDIZEM) infusion 5 mg/hr (12/11/14 1012)      Time spent: 35 minutes    Theta Leaf, [place]  [facility] Pager [phone]. If 7PM-7AM, please contact night-coverage at [email or web address], password [facility] 12/11/2014, 11:30 AM  LOS: 2 days

## 2014-12-11 NOTE — Progress Notes (Signed)
[place], MD responsed to page and notified of patients HR clime to 180's and rinsed back in dialysis to return to step down unit.

## 2014-12-11 NOTE — Progress Notes (Signed)
ANTIBIOTIC CONSULT NOTE - Follow-up  Pharmacy Consult for Vanco/Zosyn Indication: rule out sepsis  Allergies  Allergen Reactions  . Keflex [Cephalexin] Other (See Comments)    c-diff reaction  . Dilaudid [Hydromorphone] Nausea And Vomiting    Patient Measurements: Height: 6' (182.9 cm) Weight: 183 lb 3.2 oz (83.1 kg) IBW/kg (Calculated) : 77.6  Vital Signs: Temp: 98.9 F (37.2 C) (08/23 0730) Temp Source: Oral (08/23 0730) BP: 103/60 mmHg (08/23 0730) Pulse Rate: 88 (08/23 0730) Intake/Output from previous day: 08/22 0701 - 08/23 0700 In: 1123.4 [P.O.:360; I.V.:313.4; IV Piggyback:450] Out: 1481 [Urine:200] Intake/Output from this shift:    Labs:  Recent Labs  12/10/14 1616 12/10/14 2100 12/11/14 0250  WBC 15.2* 15.6* 15.9*  HGB 8.4* 9.2* 8.7*  PLT 168 182 189  CREATININE 6.62* 4.64* 5.02*   Estimated Creatinine Clearance: 17.4 mL/min (by C-G formula based on Cr of 5.02). No results for input(s): VANCOTROUGH, VANCOPEAK, VANCORANDOM, GENTTROUGH, GENTPEAK, GENTRANDOM, TOBRATROUGH, TOBRAPEAK, TOBRARND, AMIKACINPEAK, AMIKACINTROU, AMIKACIN in the last 72 hours.   Assessment: 59 y/o M presents with suspected sepsis. He was recently hospitalized for acute encephalopathy, had hospital acquired pneumonia, enterococcal UTI and positive C.diff PCR. He now continues on vancomycin + zosyn. Pt had HD yesterday but never received his post-HD vanc dose.   Vanc 8/21>> Zosyn 8/21>> Po vanc 8/22>>  8/21 BCx2>> 8/22 Cdiff antigen>>POS  Goal of Therapy:  Vanc level 15-25 pre-HD  Plan:  - Vancomycin 1gm IV x 1 now - F/u further HD plans - Continue zosyn 2.25gm IV Q8H - F/u renal plans, C&S, clinical status and trough at [facility], PharmD, BCPS Pager # [phone] 12/11/2014 8:33 AM

## 2014-12-12 ENCOUNTER — Inpatient Hospital Stay (HOSPITAL_COMMUNITY): Payer: 59

## 2014-12-12 DIAGNOSIS — Z992 Dependence on renal dialysis: Secondary | ICD-10-CM

## 2014-12-12 DIAGNOSIS — J189 Pneumonia, unspecified organism: Secondary | ICD-10-CM

## 2014-12-12 DIAGNOSIS — R5381 Other malaise: Secondary | ICD-10-CM

## 2014-12-12 DIAGNOSIS — G9340 Encephalopathy, unspecified: Secondary | ICD-10-CM

## 2014-12-12 DIAGNOSIS — I482 Chronic atrial fibrillation: Secondary | ICD-10-CM

## 2014-12-12 DIAGNOSIS — N186 End stage renal disease: Secondary | ICD-10-CM

## 2014-12-12 LAB — PROTIME-INR
INR: 1.61 — AB (ref 0.00–1.49)
Prothrombin Time: 19.2 seconds — ABNORMAL HIGH (ref 11.6–15.2)

## 2014-12-12 LAB — BASIC METABOLIC PANEL
Anion gap: 14 (ref 5–15)
BUN: 40 mg/dL — ABNORMAL HIGH (ref 6–20)
CALCIUM: 7.9 mg/dL — AB (ref 8.9–10.3)
CO2: 23 mmol/L (ref 22–32)
CREATININE: 6.37 mg/dL — AB (ref 0.61–1.24)
Chloride: 90 mmol/L — ABNORMAL LOW (ref 101–111)
GFR, EST AFRICAN AMERICAN: 10 mL/min — AB (ref >60)
GFR, EST NON AFRICAN AMERICAN: 9 mL/min — AB (ref >60)
GLUCOSE: 206 mg/dL — AB (ref 65–99)
Potassium: 3.7 mmol/L (ref 3.5–5.1)
Sodium: 127 mmol/L — ABNORMAL LOW (ref 135–145)

## 2014-12-12 LAB — CBC
HEMATOCRIT: 24.7 % — AB (ref 39.0–52.0)
Hemoglobin: 8.2 g/dL — ABNORMAL LOW (ref 13.0–17.0)
MCH: 30.7 pg (ref 26.0–34.0)
MCHC: 33.2 g/dL (ref 30.0–36.0)
MCV: 92.5 fL (ref 78.0–100.0)
PLATELETS: 231 10*3/uL (ref 150–400)
RBC: 2.67 MIL/uL — ABNORMAL LOW (ref 4.22–5.81)
RDW: 18.4 % — AB (ref 11.5–15.5)
WBC: 11.7 10*3/uL — AB (ref 4.0–10.5)

## 2014-12-12 LAB — PROCALCITONIN: PROCALCITONIN: 30.22 ng/mL

## 2014-12-12 LAB — GLUCOSE, CAPILLARY
GLUCOSE-CAPILLARY: 227 mg/dL — AB (ref 65–99)
GLUCOSE-CAPILLARY: 240 mg/dL — AB (ref 65–99)
Glucose-Capillary: 154 mg/dL — ABNORMAL HIGH (ref 65–99)

## 2014-12-12 MED ORDER — NEPRO/CARBSTEADY PO LIQD: 237.0000 mL | ORAL | Status: DC | PRN

## 2014-12-12 MED ORDER — DIPHENHYDRAMINE HCL 25 MG PO CAPS
50.0000 mg | ORAL_CAPSULE | Freq: Once | ORAL | Status: AC
  Administered 2014-12-13: 50 mg via ORAL
  Filled 2014-12-12: qty 2

## 2014-12-12 MED ORDER — LIDOCAINE HCL (PF) 1 % IJ SOLN: 5.0000 mL | INTRAMUSCULAR | Status: DC | PRN

## 2014-12-12 MED ORDER — HEPARIN SODIUM (PORCINE) 1000 UNIT/ML DIALYSIS: 1000.0000 [IU] | INTRAMUSCULAR | Status: DC | PRN

## 2014-12-12 MED ORDER — WARFARIN - PHARMACIST DOSING INPATIENT
Freq: Every day | Status: DC
  Administered 2014-12-12 – 2014-12-13 (×2)

## 2014-12-12 MED ORDER — ALTEPLASE 2 MG IJ SOLR: 2.0000 mg | Freq: Once | INTRAMUSCULAR | Status: DC | PRN

## 2014-12-12 MED ORDER — SODIUM CHLORIDE 0.9 % IV SOLN: 100.0000 mL | INTRAVENOUS | Status: DC | PRN

## 2014-12-12 MED ORDER — METOPROLOL TARTRATE 25 MG PO TABS
25.0000 mg | ORAL_TABLET | Freq: Two times a day (BID) | ORAL | Status: DC
  Administered 2014-12-12 – 2014-12-13 (×3): 25 mg via ORAL
  Filled 2014-12-12 (×4): qty 1

## 2014-12-12 MED ORDER — CALCITRIOL 0.5 MCG PO CAPS
ORAL_CAPSULE | ORAL | Status: AC
  Filled 2014-12-12: qty 3

## 2014-12-12 MED ORDER — WARFARIN SODIUM 7.5 MG PO TABS
7.5000 mg | ORAL_TABLET | Freq: Once | ORAL | Status: AC
Start: 2014-12-12 — End: 2014-12-12
  Administered 2014-12-12: 7.5 mg via ORAL
  Filled 2014-12-12: qty 1

## 2014-12-12 MED ORDER — PENTAFLUOROPROP-TETRAFLUOROETH EX AERO: 1.0000 "application " | INHALATION_SPRAY | CUTANEOUS | Status: DC | PRN

## 2014-12-12 MED ORDER — LIDOCAINE-PRILOCAINE 2.5-2.5 % EX CREA: 1.0000 "application " | TOPICAL_CREAM | CUTANEOUS | Status: DC | PRN

## 2014-12-12 NOTE — Clinical Documentation Improvement (Signed)
Internal Medicine Nephrology/Renal  Can the diagnosis of anemia be further specified?   Iron deficiency Anemia  Nutritional anemia, including the nutrition or mineral deficits  Chronic Anemia, including the suspected or known cause  Anemia of chronic disease, including the associated chronic disease state  Other  Clinically Undetermined  Document any associated diagnoses/conditions.  Supporting Information:  H&H 8.6 / 26.4  Patient has ESRD  Please exercise your independent, professional judgment when responding. A specific answer is not anticipated or expected.  Thank You,  [place] [phone]

## 2014-12-12 NOTE — Progress Notes (Signed)
Inpatient Diabetes Program Recommendations  AACE/ADA: New Consensus Statement on Inpatient Glycemic Control (2013)  Target Ranges:  Prepandial:   less than 140 mg/dL      Peak postprandial:   less than 180 mg/dL (1-2 hours)      Critically ill patients:  140 - 180 mg/dL    Results for Joseph [name], Joseph [name] (MRN [id number]) as of 12/12/2014 08:53  Ref. Range 12/11/2014 07:25 12/11/2014 13:21 12/11/2014 17:57 12/11/2014 21:12  Glucose-Capillary Latest Ref Range: 65-99 mg/dL 190 (H) 261 (H) 234 (H) 250 (H)    Results for Joseph [name], Joseph [name] (MRN [id number]) as of 12/12/2014 08:53  Ref. Range 12/12/2014 08:04  Glucose Latest Ref Range: 65-99 mg/dL 206 (H)    Home DM Meds: Lantus 20 units bid       Novolog 2-10 units tid per SSI  Current DM Orders: Novolog Sensitive SSI (0-9 units) TID AC     -Note patient for dialysis today.  -Glucose levels elevated.     MD-- Please consider starting 50% of home dose of Lantus-  Lantus 10 units bid     Will follow Joseph Quaker RN, MSN, CDE Diabetes Coordinator Inpatient Glycemic Control Team Team Pager: [phone] (8a-5p)

## 2014-12-12 NOTE — Progress Notes (Addendum)
[facility] PROGRESS NOTE  Joseph [name]:[id number] DOB: [birth year] DOA: 12/09/2014 PCP: Webb Silversmith, NP   Brief narrative 59 year old male with history of end-stage renal disease on dialysis (M, W, F), chronic A. fib on Coumadin, gout, type 2 diabetes mellitus with recent hospitalization for encephalopathy with MRI showing them became cephalitis. During that time he had undergone LP which was unremarkable. He did respond to IV steroid initially which was discontinued as patient had C. difficile colitis. Patient brought to the ED as he was having persistent fever for one day associated with productive cough with some hemoptysis. He also reported having nausea and vomiting with diarrhea. On presentation patient was mildly hypotensive and received 1 L IV normal saline bolus in the ED following which his heart rate and blood pressure improved. Patient appeared to have some confusion and lethargy on presentation. Patient criteria for sepsis and imaging showed left-sided pneumonia. Patient placed on empiric antibiotics and admitted to stepdown.  Assessment/Plan: Sepsis secondary to healthcare associated pneumonia On empiric vancomycin and Zosyn. Follow blood cultures. Mild hemoptysis on presentation which has now resolved. H&H stable. Appreciate pulmonary evaluation and recommendations. -Follow blood culture.  Acute encephalopathy Possibly related to sepsis and has improved today. MRI on admission showing persistent abnormal T2/FLAIR signal intensity involving mesial temporal lobes/hippocampi bilaterally which seems to have improved overall compared to MRI from 11/21/2014. Patient confused again past 24 hours. Reportedly was hallucinating during the night and restless. Received IV Solu-Medrol for 5 days during recent hospitalization. LP was negative. Consulted the hospitalist.  Nausea vomiting with diarrhea Stool for C. difficile  suggestive of colonization. Discontinued oral vancomycin. No  further nausea or vomiting.  A. fib with RVR After returning from dialysis on 8/22. Was started on Cardizem drip but had low blood pressure so that it was reduced and started on amiodarone drip. Still requiring Cardizem and amiodarone drip. Will consult cardiology. - Resume Coumadin. Echo from 08/2014 showed LVH with EF of 50-55%  ESRD on hemodialysis Renal following. Hemodialysis today.  History of seizure Continue Dilantin.  Diabetes mellitus Continue with sliding scale insulin.  Protein calorie malnutrition Continue supplements  Diet: Renal/heart healthy  DVT prophylaxis: coumadin  Code Status: Full code Family Communication: Wife at bedside Disposition Plan: Continue step down monitoring   Consultants:  Renal  Pulmonary  Procedures:  Dialysis  MRI brain  Antibiotics:  IV vancomycin and Zosyn since 8/22  HPI/Subjective: Patient seen and examined during hemodialysis. He is confused and reports that he has not had dialysis in 2 years and getting dialysis today. As per wife he was hallucinating and talking to objects in the room . Was also shaking .   Objective: Filed Vitals:   12/12/14 1319  BP: 110/75  Pulse: 99  Temp:   Resp: 36    Intake/Output Summary (Last 24 hours) at 12/12/14 1341 Last data filed at 12/12/14 0715  Gross per 24 hour  Intake 1254.8 ml  Output      0 ml  Net 1254.8 ml   Filed Weights   12/10/14 0905 12/10/14 1500 12/11/14 2305  Weight: 78.3 kg (172 lb 9.9 oz) 83.1 kg (183 lb 3.2 oz) 80.5 kg (177 lb 7.5 oz)    Exam:   General:  no acute distress  HEENT:  most oral mucosa, no JVD  Respiratory: clear to auscultation bilaterally  CVS: S1 and S2 is irregularly irregular, no murmurs rub or gallop  GI: Soft, nondistended, nontender, bowel sounds present  Musculoskeletal: Warm, no edema  CNS: Alert and oriented 2, confused   Data Reviewed: Basic Metabolic Panel:  Recent Labs Lab 12/10/14 0440 12/10/14 1616  12/10/14 2100 12/11/14 0250 12/12/14 0804  NA 135 130* 134* 133* 127*  K 3.6 3.8 4.2 4.2 3.7  CL 96* 96* 97* 97* 90*  CO2 25 23 23 24 23   GLUCOSE 38* 221* 196* 223* 206*  BUN 37* 41* 26* 29* 40*  CREATININE 6.35* 6.62* 4.64* 5.02* 6.37*  CALCIUM 8.1* 7.7* 7.6* 7.7* 7.9*  PHOS  --  3.1 2.6  --   --    Liver Function Tests:  Recent Labs Lab 12/09/14 2030 12/10/14 0440 12/10/14 1616 12/10/14 2100  AST 70* 62*  --   --   ALT 55 55  --   --   ALKPHOS 280* 274*  --   --   BILITOT 1.3* 1.9*  --   --   PROT 5.7* 6.2*  --   --   ALBUMIN 1.9* 1.9* 1.7* 1.7*    Recent Labs Lab 12/10/14 0440  LIPASE 38   No results for input(s): AMMONIA in the last 168 hours. CBC:  Recent Labs Lab 12/09/14 2030  12/10/14 0440 12/10/14 1616 12/10/14 2100 12/11/14 0250 12/12/14 0804  WBC 14.8*  < > 16.1* 15.2* 15.6* 15.9* 11.7*  NEUTROABS 12.7*  --   --  13.0*  --   --   --   HGB 8.6*  < > 8.9* 8.4* 9.2* 8.7* 8.2*  HCT 26.4*  < > 27.7* 26.1* 28.6* 27.2* 24.7*  MCV 96.4  < > 95.2 94.9 96.3 96.1 92.5  PLT 157  < > 177 168 182 189 231  < > = values in this interval not displayed. Cardiac Enzymes:  Recent Labs Lab 12/10/14 0440  TROPONINI 0.10*   BNP (last 3 results)  Recent Labs  11/05/14 2205  BNP 3626.3*    ProBNP (last 3 results)  Recent Labs  02/20/14 1515 02/24/14 1145 11/15/14 1622  PROBNP 15468.0* 10410.0* >4940.0*    CBG:  Recent Labs Lab 12/11/14 0725 12/11/14 1321 12/11/14 1757 12/11/14 2112 12/12/14 1311  GLUCAP 190* 261* 234* 250* 154*    Recent Results (from the past 240 hour(s))  Blood Culture (routine x 2)     Status: None (Preliminary result)   Collection Time: 12/09/14  8:20 PM  Result Value Ref Range Status   Specimen Description BLOOD RIGHT WRIST  Final   Special Requests BOTTLES DRAWN AEROBIC AND ANAEROBIC 5CC  Final   Culture NO GROWTH 2 DAYS  Final   Report Status PENDING  Incomplete  Blood Culture (routine x 2)     Status: None  (Preliminary result)   Collection Time: 12/09/14  8:25 PM  Result Value Ref Range Status   Specimen Description BLOOD RIGHT HAND  Final   Special Requests BOTTLES DRAWN AEROBIC AND ANAEROBIC 4CC  Final   Culture NO GROWTH 2 DAYS  Final   Report Status PENDING  Incomplete  C difficile quick scan w PCR reflex     Status: Abnormal   Collection Time: 12/10/14  4:42 AM  Result Value Ref Range Status   C Diff antigen POSITIVE (A) NEGATIVE Final   C Diff toxin NEGATIVE NEGATIVE Final   C Diff interpretation   Final    C. difficile present, but toxin not detected. This indicates colonization. In most cases, this does not require treatment. If patient has signs and symptoms consistent with colitis, consider treatment.     Studies: Dg  Chest Port 1 View  12/12/2014   CLINICAL DATA:  Health care associated pneumonia.  EXAM: PORTABLE CHEST - 1 VIEW  COMPARISON:  December 09, 2014.  FINDINGS: Stable cardiomediastinal silhouette. No pneumothorax is noted. Right lung is clear. Stable left basilar opacity is noted most consistent with pneumonia. No significant pleural effusion is noted. Bony thorax appears intact.  IMPRESSION: Stable left basilar opacity is noted most consistent with pneumonia. Continued radiographic follow-up is recommended.   Electronically Signed   By: Marijo Conception, M.D.   On: 12/12/2014 07:24    Scheduled Meds: . atorvastatin  40 mg Oral QHS  . calcitRIOL      . calcitRIOL  1.75 mcg Oral Q M,W,F-HD  . colchicine  0.6 mg Oral Daily  . darbepoetin (ARANESP) injection - DIALYSIS  100 mcg Intravenous Q Mon-HD  . feeding supplement (NEPRO CARB STEADY)  237 mL Oral BID BM  . fluocinonide ointment   Topical QID  . insulin aspart  0-9 Units Subcutaneous TID WC  . multivitamin  1 tablet Oral QHS  . phenytoin  300 mg Oral q morning - 10a  . piperacillin-tazobactam (ZOSYN)  IV  2.25 g Intravenous Q8H  . saccharomyces boulardii  250 mg Oral BID  . sodium chloride  3 mL Intravenous Q12H  .  vancomycin  1,000 mg Intravenous Q M,W,F-HD   Continuous Infusions: . amiodarone 30 mg/hr (12/12/14 1306)  . diltiazem (CARDIZEM) infusion 15 mg/hr (12/12/14 1320)      Time spent: 35 minutes    Joseph [name], [employer]  [facility] Pager [phone]. If 7PM-7AM, please contact night-coverage at [email or web address], password [facility] 12/12/2014, 1:41 PM  LOS: 3 days

## 2014-12-12 NOTE — Progress Notes (Signed)
[facility] Progress Note   Subjective: alert, afib 130-150's  Filed Vitals:   12/12/14 1303 12/12/14 1312 12/12/14 1319 12/12/14 1400  BP: 114/64 103/71 110/75 129/77  Pulse: 100 87 99   Temp:  100.1 F (37.8 C)    TempSrc:  Oral    Resp: 24 24 36 31  Height:      Weight:      SpO2: 90% 97% 97%    Exam: Alert, no distress, up in chair No jvd Chest clear bilat RRR no MRG Abd soft, no ascites or hsm No LE or UE edema L arm AVG +bruit Neuro is alert and mostly oriented  [facility] 4 hrs 80kgs 3K/2.5Ca No Heparin Profile 2 Micera 100 q 2 weeks- to start 8/24 Calcitriol 1.75      Assessment: 1. Fever/ HCAP - on abx, better 2. 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 Afib on iv dilt/ amio, dc MTP 4. ESRD HD today 5. Volume - at dry wt, some hip edema 6. Anemia starting esa 100 /wk 7. MBD low phos, cont vit D   Plan - next HD Fri, get vol down some next HD    Kelly Splinter MD  pager [phone]    cell [phone]  12/12/2014, 2:26 PM     Recent Labs Lab 12/10/14 1616 12/10/14 2100 12/11/14 0250 12/12/14 0804  NA 130* 134* 133* 127*  K 3.8 4.2 4.2 3.7  CL 96* 97* 97* 90*  CO2 23 23 24 23   GLUCOSE 221* 196* 223* 206*  BUN 41* 26* 29* 40*  CREATININE 6.62* 4.64* 5.02* 6.37*  CALCIUM 7.7* 7.6* 7.7* 7.9*  PHOS 3.1 2.6  --   --     Recent Labs Lab 12/09/14 2030 12/10/14 0440 12/10/14 1616 12/10/14 2100  AST 70* 62*  --   --   ALT 55 55  --   --   ALKPHOS 280* 274*  --   --   BILITOT 1.3* 1.9*  --   --   PROT 5.7* 6.2*  --   --   ALBUMIN 1.9* 1.9* 1.7* 1.7*    Recent Labs Lab 12/09/14 2030  12/10/14 1616 12/10/14 2100 12/11/14 0250 12/12/14 0804  WBC 14.8*  < > 15.2* 15.6* 15.9* 11.7*  NEUTROABS 12.7*  --  13.0*  --   --   --   HGB 8.6*  < > 8.4* 9.2* 8.7* 8.2*  HCT 26.4*  < > 26.1* 28.6* 27.2* 24.7*  MCV 96.4  < > 94.9 96.3 96.1 92.5  PLT 157  < > 168 182 189 231  < > = values in this interval not displayed. Marland Kitchen atorvastatin  40 mg Oral  QHS  . calcitRIOL      . calcitRIOL  1.75 mcg Oral Q M,W,F-HD  . colchicine  0.6 mg Oral Daily  . darbepoetin (ARANESP) injection - DIALYSIS  100 mcg Intravenous Q Mon-HD  . feeding supplement (NEPRO CARB STEADY)  237 mL Oral BID BM  . fluocinonide ointment   Topical QID  . insulin aspart  0-9 Units Subcutaneous TID WC  . multivitamin  1 tablet Oral QHS  . phenytoin  300 mg Oral q morning - 10a  . piperacillin-tazobactam (ZOSYN)  IV  2.25 g Intravenous Q8H  . saccharomyces boulardii  250 mg Oral BID  . sodium chloride  3 mL Intravenous Q12H  . vancomycin  1,000 mg Intravenous Q M,W,F-HD   . amiodarone 30 mg/hr (12/12/14 1306)  . diltiazem (CARDIZEM) infusion 15  mg/hr (12/12/14 1320)   acetaminophen, benzonatate, guaiFENesin-dextromethorphan

## 2014-12-12 NOTE — Consult Note (Signed)
CARDIOLOGY CONSULT NOTE   Patient ID: CHEVEZ SAMBRANO MRN: [id number] DOB/AGE: [birth year] 59 y.o.  Admit Date: 12/09/2014  Primary Physician: Webb Silversmith, NP  Primary Cardiologist    Alto Summary  Cardiology is consulted at this time because the patient's atrial fibrillation remains elevated despite IV medications.  Mr. Kats is a 59 y.o.male. He has a history of chronic atrial fibrillation. Over time he is been anticoagulated. In the past months he has had encephalitis. He returned this admission with variation in his mental status again. There has been question of sepsis related to pneumonia and this is being treated. He also has had C. difficile related to prior medications that is also being treated at this time.  TSH is normal. Historically the ventricular function is normal. He has had chronic diastolic CHF. His volume has been controlled with dialysis. There is pulmonary hypertension. 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  The patient's atrial fib rate remains higher than usual. The etiology is not clear. His rate remains elevated despite IV diltiazem and IV amiodarone.   Allergies  Allergen Reactions  . Keflex [Cephalexin] Other (See Comments)    c-diff reaction  . Dilaudid [Hydromorphone] Nausea And Vomiting    Medications Scheduled Medications: . atorvastatin  40 mg Oral QHS  . calcitRIOL      . calcitRIOL  1.75 mcg Oral Q M,W,F-HD  . colchicine  0.6 mg Oral Daily  . darbepoetin (ARANESP) injection - DIALYSIS  100 mcg Intravenous Q Mon-HD  . feeding supplement (NEPRO CARB STEADY)  237 mL Oral BID BM  . fluocinonide ointment   Topical QID  . insulin aspart  0-9 Units Subcutaneous TID WC  . multivitamin  1 tablet Oral QHS  . phenytoin  300 mg Oral q morning - 10a  . piperacillin-tazobactam (ZOSYN)  IV  2.25 g Intravenous Q8H  . saccharomyces boulardii  250 mg Oral BID  . sodium chloride  3 mL Intravenous Q12H  .  vancomycin  1,000 mg Intravenous Q M,W,F-HD  . warfarin  7.5 mg Oral ONCE-1800  . Warfarin - Pharmacist Dosing Inpatient   Does not apply q1800     Infusions: . amiodarone 30 mg/hr (12/12/14 1306)  . diltiazem (CARDIZEM) infusion 15 mg/hr (12/12/14 1320)     PRN Medications:  acetaminophen, benzonatate, guaiFENesin-dextromethorphan   Past Medical History  Diagnosis Date  . Atrial fibrillation     a. 11/18/2011 s/p DCCV - 150J;  b. Anticoagulation w/ Apixaban;  c. 11/2011 back in afib->asymptomatic.  . H/O alcohol abuse     a. drinks heavily on the weekends.  . Hypertension   . Diabetes mellitus   . Heart murmur     a. 09/2011 Echo: EF 55-60%, Triv AI, Mild MR, mildly dil LA.  . Diabetic peripheral neuropathy   . CKD (chronic kidney disease), stage III   . CHF (congestive heart failure)   . Pneumonia   . Sleep apnea     Past Surgical History  Procedure Laterality Date  . Eye sx  11/11/2011    left eye  . Cardioversion  11/18/2011    Procedure: CARDIOVERSION;  Surgeon: Josue Hector, MD;  Location: [facility] ENDOSCOPY;  Service: Cardiovascular;  Laterality: N/A;  . Colonoscopy w/ biopsies and polypectomy    . Av fistula placement Left 02/13/2014    Procedure: INSERTION OF ARTERIOVENOUS (AV) GORE-TEX GRAFT ARM;  Surgeon: Angelia Mould, MD;  Location: [place];  Service: Vascular;  Laterality: Left;  . Right heart catheterization N/A 10/11/2013    Procedure: RIGHT HEART CATH;  Surgeon: Larey Dresser, MD;  Location[facility] CATH LAB;  Service: Cardiovascular;  Laterality: N/A;    Family History  Problem Relation Age of Onset  . Heart disease Mother     before age 78  . Diabetes Father   . Diabetes Sister   . Peripheral vascular disease Sister     amputation  . Cancer Neg Hx   . Stroke Neg Hx     Social History Mr. Ghrist reports that he quit smoking about 20 years ago. His smoking use included Cigarettes. He has a .05 pack-year smoking history. He has never used smokeless  tobacco. Mr. Krutz reports that he does not drink alcohol.  Review of Systems  It is difficult to assess his mental status. At this time he is not complaining of chills, sweats, change in vision, change in hearing, chest pain, cough, nausea or vomiting, urinary symptoms. All other systems are reviewed and are negative.  Physical Examination Blood pressure 129/77, pulse 99, temperature 100.1 F (37.8 C), temperature source Oral, resp. rate 31, height 6' (1.829 m), weight 177 lb 7.5 oz (80.5 kg), SpO2 97 %.  Intake/Output Summary (Last 24 hours) at 12/12/14 1447 Last data filed at 12/12/14 0715  Gross per 24 hour  Intake 1084.8 ml  Output      0 ml  Net 1084.8 ml   The patient's mental status is better today than it has been. Lungs reveal decreased breath sounds. Cardiac exam reveals S1 and S2. The abdomen is soft. There is no significant peripheral edema.  Prior Cardiac Testing/Procedures  Lab Results  Basic Metabolic Panel:  Recent Labs Lab 12/10/14 0440 12/10/14 1616 12/10/14 2100 12/11/14 0250 12/12/14 0804  NA 135 130* 134* 133* 127*  K 3.6 3.8 4.2 4.2 3.7  CL 96* 96* 97* 97* 90*  CO2 25 23 23 24 23   GLUCOSE 38* 221* 196* 223* 206*  BUN 37* 41* 26* 29* 40*  CREATININE 6.35* 6.62* 4.64* 5.02* 6.37*  CALCIUM 8.1* 7.7* 7.6* 7.7* 7.9*  PHOS  --  3.1 2.6  --   --     Liver Function Tests:  Recent Labs Lab 12/09/14 2030 12/10/14 0440 12/10/14 1616 12/10/14 2100  AST 70* 62*  --   --   ALT 55 55  --   --   ALKPHOS 280* 274*  --   --   BILITOT 1.3* 1.9*  --   --   PROT 5.7* 6.2*  --   --   ALBUMIN 1.9* 1.9* 1.7* 1.7*    CBC:  Recent Labs Lab 12/09/14 2030  12/10/14 0440 12/10/14 1616 12/10/14 2100 12/11/14 0250 12/12/14 0804  WBC 14.8*  < > 16.1* 15.2* 15.6* 15.9* 11.7*  NEUTROABS 12.7*  --   --  13.0*  --   --   --   HGB 8.6*  < > 8.9* 8.4* 9.2* 8.7* 8.2*  HCT 26.4*  < > 27.7* 26.1* 28.6* 27.2* 24.7*  MCV 96.4  < > 95.2 94.9 96.3 96.1 92.5  PLT 157   < > 177 168 182 189 231  < > = values in this interval not displayed.  Cardiac Enzymes:  Recent Labs Lab 12/10/14 0440  TROPONINI 0.10*    BNP: Invalid input(s): POCBNP   Radiology: Dg Chest Port 1 View  12/12/2014   CLINICAL DATA:  Health care associated pneumonia.  EXAM: PORTABLE  CHEST - 1 VIEW  COMPARISON:  December 09, 2014.  FINDINGS: Stable cardiomediastinal silhouette. No pneumothorax is noted. Right lung is clear. Stable left basilar opacity is noted most consistent with pneumonia. No significant pleural effusion is noted. Bony thorax appears intact.  IMPRESSION: Stable left basilar opacity is noted most consistent with pneumonia. Continued radiographic follow-up is recommended.   Electronically Signed   By: Marijo Conception, M.D.   On: 12/12/2014 07:24     ECG: I have reviewed the EKGs. He has atrial fibrillation. He has nonspecific ST-T wave changes. There are also frequent wide complex beats that may be aggravated atrial fib beats.   Impression and Recommendations       Atrial fibrillation-permanent     Despite the fact that his other medical problems appear to be improving, his atrial fibrillation rate remains high despite IV diltiazem and IV amiodarone. I'm hesitant to push the IV meds any further. Prior to admission the patient had been on a beta blocker. There is concern about his blood pressure, but I've chosen to restart his beta blocker. The plan would be to watch his rhythm response to this. We can then decide if digoxin should be considered also.    Essential hypertension   Gout   HCAP (healthcare-associated pneumonia)   ESRD on hemodialysis   Nausea vomiting and diarrhea   Pneumonia   Encephalopathy acute   Debility    Daryel November, MD 12/12/2014, 2:47 PM

## 2014-12-12 NOTE — Consult Note (Signed)
NEURO HOSPITALIST CONSULT NOTE   Referring physician:Dungel   Reason for Consult: Encephalitis  HPI:                                                                                                                                          Joseph [name] is an 59 y.o. male who was hospitalized earlier this month and diagnosed with Limbic Encephalitis. At that time he was treated with 5 days of Solumedrol with improved mental status. At that time his CMV DNA was )+( and ID was consulted.  Their recommendation were to stop Solumedrol.  At that time he had finished his 5 day course.  At time of discharge CSF lyme/NMDA ab and paraneoplastic panel were pending. Patient was brought back to ED due to continued confusion in setting of hypotension and temperature of 103. He was admitted and started on Vanc and Zosyn for PNA and found to be C. Diff positive.   MRI was obtained and showed persistant abnormal T2/Flair involving the mesial temporal lobes/hippocampi bilaterally, with question of trace diffusion abnormality within the right hippocampus. Although findings appear overall improved relative to previous MRI from 11/21/2014, the T2/FLAIR signal abnormality appears slightly worsened relative to prior MRI from 11/26/2014 . 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    Currently patient is Afebrile with WBC trending down (current 11.7).  Pertinent labs : NA 127, BUN 40, Cr 6.37, WBC 11.7, INR 1.61  Of note--CSF Lyme was normal and Paraneoplastic panel was also negative.   Past Medical History  Diagnosis Date  . Atrial fibrillation     a. 11/18/2011 s/p DCCV - 150J;  b. Anticoagulation w/ Apixaban;  c. 11/2011 back in afib->asymptomatic.  . H/O alcohol abuse     a. drinks heavily on the weekends.  . Hypertension   . Diabetes mellitus   . Heart murmur     a. 09/2011 Echo: EF 55-60%, Triv AI, Mild MR, mildly dil LA.  . Diabetic peripheral  neuropathy   . CKD (chronic kidney disease), stage III   . CHF (congestive heart failure)   . Pneumonia   . Sleep apnea     Past Surgical History  Procedure Laterality Date  . Eye sx  11/11/2011    left eye  . Cardioversion  11/18/2011    Procedure: CARDIOVERSION;  Surgeon: Josue Hector, MD;  Location: [facility] ENDOSCOPY;  Service: Cardiovascular;  Laterality: N/A;  . Colonoscopy w/ biopsies and polypectomy    . Av fistula placement Left 02/13/2014    Procedure: INSERTION OF ARTERIOVENOUS (AV) GORE-TEX GRAFT ARM;  Surgeon: Angelia Mould, MD;  Location: [facility];  Service: Vascular;  Laterality: Left;  . Right heart catheterization N/A 10/11/2013    Procedure: RIGHT HEART CATH;  Surgeon: Larey Dresser, MD;  Location: [facility] CATH LAB;  Service: Cardiovascular;  Laterality: N/A;    Family History  Problem Relation Age of Onset  . Heart disease Mother     before age 40  . Diabetes Father   . Diabetes Sister   . Peripheral vascular disease Sister     amputation  . Cancer Neg Hx   . Stroke Neg Hx       Social History:  reports that he quit smoking about 20 years ago. His smoking use included Cigarettes. He has a .05 pack-year smoking history. He has never used smokeless tobacco. He reports that he does not drink alcohol or use illicit drugs.  Allergies  Allergen Reactions  . Keflex [Cephalexin] Other (See Comments)    c-diff reaction  . Dilaudid [Hydromorphone] Nausea And Vomiting    MEDICATIONS:                                                                                                                     Prior to Admission:  Prescriptions prior to admission  Medication Sig Dispense Refill Last Dose  . atorvastatin (LIPITOR) 40 MG tablet Take 40 mg by mouth at bedtime.    12/08/2014 at Unknown time  . calcitRIOL (ROCALTROL) 0.25 MCG capsule Take 7 capsules (1.75 mcg total) by mouth every Monday, Wednesday, and Friday with hemodialysis.   Past Week at Unknown time  .  colchicine 0.6 MG tablet TAKE 1 TABLET (0.6 MG TOTAL) BY MOUTH 2 (TWO) TIMES DAILY. 60 tablet 2 12/09/2014 at Unknown time  . diltiazem (CARDIZEM CD) 120 MG 24 hr capsule Take 1 capsule (120 mg total) by mouth daily. 30 capsule 0 12/09/2014 at Unknown time  . ethyl chloride spray Apply 1 application topically every other day. Mon Wed Fri - done with dialysis  6 Past Week at Unknown time  . fluocinonide ointment (LIDEX) 0.05 % Apply topically 4 (four) times daily. 30 g 0 12/09/2014 at Unknown time  . hydrocerin (EUCERIN) CREA Apply 1 application topically 4 (four) times daily.  0 12/09/2014 at Unknown time  . insulin aspart (NOVOLOG) 100 UNIT/ML injection Before each meal 3 times a day, 140-199 - 2 units, 200-250 - 4 units, 251-299 - 6 units,  300-349 - 8 units,  350 or above 10 units. 10 mL 11 12/09/2014 at Unknown time  . Insulin Glargine (LANTUS SOLOSTAR) 100 UNIT/ML Solostar Pen Inject 20 Units into the skin 2 (two) times daily. 15 mL 11 12/09/2014 at Unknown time  . levocetirizine (XYZAL) 5 MG tablet Take 5 mg by mouth every evening.   12/08/2014 at Unknown time  . metoprolol tartrate (LOPRESSOR) 50 MG tablet Take 1 tablet (50 mg total) by mouth 2 (two) times daily.   12/09/2014 at 0900  . Nutritional Supplements (FEEDING SUPPLEMENT, NEPRO CARB STEADY,) LIQD Take 237 mLs by mouth 2 (two) times daily between meals. 20 Can 0 12/09/2014 at Unknown time  . ondansetron (ZOFRAN) 4 MG tablet Take 4 mg  by mouth 3 (three) times daily as needed for nausea or vomiting.   prn  . pantoprazole (PROTONIX) 20 MG tablet Take 1 tablet (20 mg total) by mouth 2 (two) times daily.   12/09/2014 at Unknown time  . phenytoin (DILANTIN) 300 MG ER capsule Take 1 capsule (300 mg total) by mouth at bedtime. (Patient taking differently: Take 300 mg by mouth every morning. )   12/09/2014 at Unknown time  . saccharomyces boulardii (FLORASTOR) 250 MG capsule Take 1 capsule (250 mg total) by mouth 2 (two) times daily. (Patient taking  differently: Take 250 mg by mouth daily. ) 30 capsule 0 12/09/2014 at Unknown time  . sevelamer carbonate (RENVELA) 800 MG tablet Take 2 tablets (1,600 mg total) by mouth 3 (three) times daily with meals.   12/09/2014 at Unknown time  . warfarin (COUMADIN) 5 MG tablet Take 1 tablet (5 mg total) by mouth daily. 30 tablet 0 12/09/2014 at Unknown time  . enoxaparin (LOVENOX) 150 MG/ML injection Inject 0.55 mLs (85 mg total) into the skin daily. Stopped once INR reaches 2. 0 Syringe     Scheduled: . atorvastatin  40 mg Oral QHS  . calcitRIOL      . calcitRIOL  1.75 mcg Oral Q M,W,F-HD  . colchicine  0.6 mg Oral Daily  . darbepoetin (ARANESP) injection - DIALYSIS  100 mcg Intravenous Q Mon-HD  . feeding supplement (NEPRO CARB STEADY)  237 mL Oral BID BM  . fluocinonide ointment   Topical QID  . insulin aspart  0-9 Units Subcutaneous TID WC  . multivitamin  1 tablet Oral QHS  . phenytoin  300 mg Oral q morning - 10a  . piperacillin-tazobactam (ZOSYN)  IV  2.25 g Intravenous Q8H  . saccharomyces boulardii  250 mg Oral BID  . sodium chloride  3 mL Intravenous Q12H  . vancomycin  1,000 mg Intravenous Q M,W,F-HD     ROS:                                                                                                                                       History obtained from the patient  General ROS: negative for - chills, fatigue, fever, night sweats, weight gain or weight loss Psychological ROS: negative for - behavioral disorder, hallucinations, memory difficulties, mood swings or suicidal ideation Ophthalmic ROS: negative for - blurry vision, double vision, eye pain or loss of vision ENT ROS: negative for - epistaxis, nasal discharge, oral lesions, sore throat, tinnitus or vertigo Allergy and Immunology ROS: negative for - hives or itchy/watery eyes Hematological and Lymphatic ROS: negative for - bleeding problems, bruising or swollen lymph nodes Endocrine ROS: negative for - galactorrhea, hair  pattern changes, polydipsia/polyuria or temperature intolerance Respiratory ROS: negative for - cough, hemoptysis, shortness of breath or wheezing Cardiovascular ROS: negative for - chest pain, dyspnea on exertion, edema or irregular heartbeat Gastrointestinal ROS: negative for -  abdominal pain, diarrhea, hematemesis, nausea/vomiting or stool incontinence Genito-Urinary ROS: negative for - dysuria, hematuria, incontinence or urinary frequency/urgency Musculoskeletal ROS: negative for - joint swelling or muscular weakness Neurological ROS: as noted in HPI Dermatological ROS: negative for rash and skin lesion changes   Blood pressure 103/71, pulse 87, temperature 100.1 F (37.8 C), temperature source Oral, resp. rate 24, height 6' (1.829 m), weight 80.5 kg (177 lb 7.5 oz), SpO2 97 %.   Neurologic Examination:                                                                                                      HEENT-  Normocephalic, no lesions, without obvious abnormality.  Normal external eye and conjunctiva.  Normal TM's bilaterally.  Normal auditory canals and external ears. Normal external nose, mucus membranes and septum.  Normal pharynx. Cardiovascular- S1, S2 normal, pulses palpable throughout   Lungs- chest clear, no wheezing, rales, normal symmetric air entry Abdomen- normal findings: bowel sounds normal Extremities- no edema Lymph-no adenopathy palpable Musculoskeletal-no joint tenderness, deformity or swelling Skin-warm and dry, no hyperpigmentation, vitiligo, or suspicious lesions  Neurological Examination Mental Status: Alert, oriented to hospital but believes it is June 2012.  HE does not recall seeing me the past hospitalization and is not sure why he is in the hospital. Speech fluent without evidence of aphasia.  Able to follow 3 step commands without difficulty. Cranial Nerves: II: Discs flat bilaterally; Visual fields grossly normal, pupils equal, round, reactive to light  and accommodation III,IV, VI: ptosis not present, extra-ocular motions intact bilaterally V,VII: smile symmetric, facial light touch sensation normal bilaterally VIII: hearing normal bilaterally IX,X: uvula rises symmetrically XI: bilateral shoulder shrug XII: midline tongue extension Motor: Right : Upper extremity   5/5    Left:     Upper extremity   5/5  Lower extremity   5/5     Lower extremity   5/5 Tone and bulk:normal tone throughout; no atrophy noted Sensory: Pinprick and light touch intact throughout, bilaterally Deep Tendon Reflexes: 2+ and symmetric throughout UE and KJ  Plantars: Right: downgoing   Left: downgoing Cerebellar: normal finger-to-nose and normal heel-to-shin test Gait: not tested due to multiple leads.       Lab Results: Basic Metabolic Panel:  Recent Labs Lab 12/10/14 0440 12/10/14 1616 12/10/14 2100 12/11/14 0250 12/12/14 0804  NA 135 130* 134* 133* 127*  K 3.6 3.8 4.2 4.2 3.7  CL 96* 96* 97* 97* 90*  CO2 25 23 23 24 23   GLUCOSE 38* 221* 196* 223* 206*  BUN 37* 41* 26* 29* 40*  CREATININE 6.35* 6.62* 4.64* 5.02* 6.37*  CALCIUM 8.1* 7.7* 7.6* 7.7* 7.9*  PHOS  --  3.1 2.6  --   --     Liver Function Tests:  Recent Labs Lab 12/09/14 2030 12/10/14 0440 12/10/14 1616 12/10/14 2100  AST 70* 62*  --   --   ALT 55 55  --   --   ALKPHOS 280* 274*  --   --   BILITOT 1.3* 1.9*  --   --  PROT 5.7* 6.2*  --   --   ALBUMIN 1.9* 1.9* 1.7* 1.7*    Recent Labs Lab 12/10/14 0440  LIPASE 38   No results for input(s): AMMONIA in the last 168 hours.  CBC:  Recent Labs Lab 12/09/14 2030  12/10/14 0440 12/10/14 1616 12/10/14 2100 12/11/14 0250 12/12/14 0804  WBC 14.8*  < > 16.1* 15.2* 15.6* 15.9* 11.7*  NEUTROABS 12.7*  --   --  13.0*  --   --   --   HGB 8.6*  < > 8.9* 8.4* 9.2* 8.7* 8.2*  HCT 26.4*  < > 27.7* 26.1* 28.6* 27.2* 24.7*  MCV 96.4  < > 95.2 94.9 96.3 96.1 92.5  PLT 157  < > 177 168 182 189 231  < > = values in this  interval not displayed.  Cardiac Enzymes:  Recent Labs Lab 12/10/14 0440  TROPONINI 0.10*    Lipid Panel: No results for input(s): CHOL, TRIG, HDL, CHOLHDL, VLDL, LDLCALC in the last 168 hours.  CBG:  Recent Labs Lab 12/10/14 2112 12/11/14 0725 12/11/14 1321 12/11/14 1757 12/11/14 2112  GLUCAP 186* 190* 261* 234* 250*    Microbiology: Results for orders placed or performed during the hospital encounter of 12/09/14  Blood Culture (routine x 2)     Status: None (Preliminary result)   Collection Time: 12/09/14  8:20 PM  Result Value Ref Range Status   Specimen Description BLOOD RIGHT WRIST  Final   Special Requests BOTTLES DRAWN AEROBIC AND ANAEROBIC 5CC  Final   Culture NO GROWTH 2 DAYS  Final   Report Status PENDING  Incomplete  Blood Culture (routine x 2)     Status: None (Preliminary result)   Collection Time: 12/09/14  8:25 PM  Result Value Ref Range Status   Specimen Description BLOOD RIGHT HAND  Final   Special Requests BOTTLES DRAWN AEROBIC AND ANAEROBIC 4CC  Final   Culture NO GROWTH 2 DAYS  Final   Report Status PENDING  Incomplete  C difficile quick scan w PCR reflex     Status: Abnormal   Collection Time: 12/10/14  4:42 AM  Result Value Ref Range Status   C Diff antigen POSITIVE (A) NEGATIVE Final   C Diff toxin NEGATIVE NEGATIVE Final   C Diff interpretation   Final    C. difficile present, but toxin not detected. This indicates colonization. In most cases, this does not require treatment. If patient has signs and symptoms consistent with colitis, consider treatment.    Coagulation Studies:  Recent Labs  12/09/14 2220 12/10/14 0238 12/10/14 1104 12/11/14 0250 12/12/14 0804  LABPROT 30.1* 33.3* 30.4* 19.5* 19.2*  INR 2.93* 3.36* 2.97* 1.65* 1.61*    Imaging: Dg Chest Port 1 View  12/12/2014   CLINICAL DATA:  Health care associated pneumonia.  EXAM: PORTABLE CHEST - 1 VIEW  COMPARISON:  December 09, 2014.  FINDINGS: Stable cardiomediastinal  silhouette. No pneumothorax is noted. Right lung is clear. Stable left basilar opacity is noted most consistent with pneumonia. No significant pleural effusion is noted. Bony thorax appears intact.  IMPRESSION: Stable left basilar opacity is noted most consistent with pneumonia. Continued radiographic follow-up is recommended.   Electronically Signed   By: Marijo Conception, M.D.   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 He had an extensive workup including both a CSF and serum paraneoplastic panel ([facility] panel) which were negative. CSF infectious process was ruled out with LP.  EEG revealed frequent seizures at that time. He had a CT chest abdomen and pelvis which did not reveal any signs of malignancy. One malignancy that can be associated with paraneoplastic encephalitis is testicular cancer(via ma2 antibody) and may consider doing ultrasound scrotum.  Of note, he did not have an IgG index done on his CSF but did have CSF IgG performed as part of his CSF Lyme titers and this showed elevated IgG, but unclear if it was isolated to the CSF.  Possibilities include a post-infectious encephalitis, other autoimmune encephalitis, new onset seizures without encephalitis. He did have steroids earlier and there is conflicting data about whether he responded to this or not  At this point, the changes on MRI could represent injury or could represent ongoing encephalitis. Possibilities for treatment of this I feel would include immunosuppression.  He currently continues to be febrile and is being treated for both pneumonia and C. difficile colitis. Once his infection is under better control, it may be reasonable to  consider plasma exchange empirically given that there are not commercially available tests for all described antibodies causing limbic encephalitis nor is there any suspicion that we have even characterized all antibodies that can be responsible for limbic encephalitis.  I would not favor repeating a course of steroids this close to his previous course.  1) repeat EEG 2) once infections are under better control, e.g. afebrile, it may be reasonable to consider plasma exchange.  Roland Rack, MD [facility] [phone]  If 7pm- 7am, please page neurology on call as listed in [place].

## 2014-12-12 NOTE — Progress Notes (Signed)
ANTICOAGULATION CONSULT NOTE - Initial Consult  Pharmacy Consult for warfarin Indication: atrial fibrillation  Allergies  Allergen Reactions  . Keflex [Cephalexin] Other (See Comments)    c-diff reaction  . Dilaudid [Hydromorphone] Nausea And Vomiting    Patient Measurements: Height: 6' (182.9 cm) Weight: 177 lb 7.5 oz (80.5 kg) IBW/kg (Calculated) : 77.6  Vital Signs: Temp: 100.1 F (37.8 C) (08/24 1312) Temp Source: Oral (08/24 1312) BP: 129/77 mmHg (08/24 1400) Pulse Rate: 99 (08/24 1319)  Labs:  Recent Labs  12/10/14 0440 12/10/14 1104  12/10/14 2100 12/11/14 0250 12/12/14 0804  HGB 8.9*  --   < > 9.2* 8.7* 8.2*  HCT 27.7*  --   < > 28.6* 27.2* 24.7*  PLT 177  --   < > 182 189 231  LABPROT  --  30.4*  --   --  19.5* 19.2*  INR  --  2.97*  --   --  1.65* 1.61*  CREATININE 6.35*  --   < > 4.64* 5.02* 6.37*  TROPONINI 0.10*  --   --   --   --   --   < > = values in this interval not displayed.  Estimated Creatinine Clearance: 13.7 mL/min (by C-G formula based on Cr of 6.37).   Medical History: Past Medical History  Diagnosis Date  . Atrial fibrillation     a. 11/18/2011 s/p DCCV - 150J;  b. Anticoagulation w/ Apixaban;  c. 11/2011 back in afib->asymptomatic.  . H/O alcohol abuse     a. drinks heavily on the weekends.  . Hypertension   . Diabetes mellitus   . Heart murmur     a. 09/2011 Echo: EF 55-60%, Triv AI, Mild MR, mildly dil LA.  . Diabetic peripheral neuropathy   . CKD (chronic kidney disease), stage III   . CHF (congestive heart failure)   . Pneumonia   . Sleep apnea    Assessment: 59 yom on chronic coumadin for history of afib. He was also on lovenox PTA. These have been on hold due to hemoptysis but this is now resolved. Warfarin to resume today. INR 1.61 today. H/H low but has been stable and platelets are WNL. No new bleeding. Of note, pt was started on amiodarone IV with unclear long-term plans at this time.   Goal of Therapy:  INR  2-3 Monitor platelets by anticoagulation protocol: Yes   Plan:  - Warfarin 7.5mg  PO x 1 tonight - Daily INR  Anysa Tacey, Rande Lawman 12/12/2014,2:32 PM

## 2014-12-12 NOTE — Progress Notes (Signed)
Physician notified: Dhungel At: [phone]  Regarding: Pt return from HD, no cardizem infusing. HR 140-15s AF RVR, frequent uniform PVC. Cardizem currently maxed at 15mg /hr, amio infusing at 30mg /hr--HR 130-140s AF. SpO2 85% on room air during transport, this RN placed on 2LPM-recovered to 97% SpO2. Continued confusion. Will continue to monitor closely. Order clarification needed for calcitriol, two different doses ordered.

## 2014-12-12 NOTE — Procedures (Addendum)
Video-EEG (24 Hr) REPORT  Patient: Joseph [name]       Room #: 7J69 EEG No. ID: [id number] Age: 59 y.o.        Sex: male Referring Physician: Cathlean Sauer Report Date: 12/13/2013 5:53 PM        Interpreting Physician: Erin Fulling, MD   History: Joseph [name] is an 59 y.o. male with a history of seizures and mental status changes with possible limbic encephalitis, admitted for worsening confusion as well as fever.  Indications for study:  Assess severity of encephalopathy; rule out seizure activity.  Technical Description: This is an 18-channel continuous digital video-EEG recording of scalp EEG, with 21 electrodes placed according to the International 10-20 System, using multiple bipolar and monopolar montages. Activation procedures were not employed.  Description: The awake background is characterized by low voltage mixed fast frequencies, and 9-10 Hz, low voltage 10-20 uV symmetric and reactive posterior rhythm. During most of the recording, however, the patient appeared drowsy or asleep. The predominant background during drowsiness consisted of moderate voltage polymorphic theta and delta. Deeper stages of sleep were sustained, including stage II sleep with symmetric vertex waves and sleep spindles. No epileptiform discharges were recorded.  Interpretation: This EEG is an essentially normal continuous Video-EEG (24 hr) recording: Findings include: 1.  Goes to sleep promptly unless stimulated 2.  No focal or paroxysmal abnormalities  Clinical Correlation: The awake and asleep backgrounds are essentially normal, but the patient appears drowsy unless stimulated, suggesting excessive somnolence. This could reflect sleep deprivation or a minimal diffuse encephalopathy, for example, due to sedation. The absence of interictal epileptiform discharges does not rule out epilepsy; however, there was no electrographic seizure observed that would explain an altered mental status.  Erin Fulling,  MD [facility] [place] REPORT  Patient: Joseph [name]       Room #: 1O17 EEG No. ID: [id number] Age: 59 y.o.        Sex: male Referring Physician: Cathlean Sauer Report Date:  12/12/2014        Interpreting Physician: Anthony Sar  History: Joseph [name] is an 59 y.o. male with a history of seizures and mental status changes with possible limbic encephalitis, admitted for worsening confusion as well as fever.  Indications for study:  Assess severity of encephalopathy; rule out seizure activity.  Technique: This is an 18 channel routine scalp EEG performed at the bedside with bipolar and monopolar montages arranged in accordance to the international 10/20 system of electrode placement.   Description: This EEG recording was performed during wakefulness. Predominant background activity consisted of diffuse low to moderate amplitude mixed irregular delta and theta activity continuously. Photic stimulation and hyperventilation were not performed. No epileptiform discharges were recorded.  Interpretation: This EEG is abnormal with moderate generalized nonspecific continuous slowing of cerebral activity. No evidence of an epileptic disorder was demonstrated.   Rush Farmer M.D. [facility] [phone]

## 2014-12-12 NOTE — Consult Note (Signed)
Physical Medicine and Rehabilitation Consult  Reason for Consult: Debility due to sepsis from PNA, resolving encephalopathy from recent encephalitis with c diff colitis and nonspecific dermatitis.   Referring Physician: Dr. Clementeen Graham   HPI: Joseph [name] is a 59 y.o. 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 He was readmitted on 12/09/14 with fever, N/V/D, persistent cough with mild hemoptysis, tachycardia and hypotension due to sepsis from LLL PNA. He was bolused with I liter IV fluids and started on IV Vanc and Zosyn and chronic coumadin changed to IV heparin. Patient with lethargy and MRI brain repeated showing " persistent abnormal flare involving the mesial temporal lobes/hippocampi bilaterally, with question of trace diffusion abnormality within the right hippocampus".  Stools checked for C diff and showed evidence of colonization. PCCM consulted for input and recommended holding coumadin and that hemoptysis should improve with treatment of PNA.  To resume coumadin once hemoptysis resolves and INR < 3.0.  He was IV amiodarone and cardizem for control of heart rate and fluid overload managed by HD.    Mentation has improved but patient remains significantly deconditioned. PT/OT evaluations done yesterday and CIR recommended by MD and Rehab team.     Review of Systems  Unable to perform ROS: mental acuity  HENT: Negative for hearing loss.   Eyes: Positive for blurred vision (without glasses).  Respiratory: Negative for cough, shortness of breath and wheezing.   Cardiovascular: Negative for chest pain and palpitations.  Gastrointestinal: Negative for abdominal pain.  Musculoskeletal: Negative for myalgias.  Neurological: Negative for headaches.      Past Medical History  Diagnosis Date  . Atrial  fibrillation     a. 11/18/2011 s/p DCCV - 150J;  b. Anticoagulation w/ Apixaban;  c. 11/2011 back in afib->asymptomatic.  . H/O alcohol abuse     a. drinks heavily on the weekends.  . Hypertension   . Diabetes mellitus   . Heart murmur     a. 09/2011 Echo: EF 55-60%, Triv AI, Mild MR, mildly dil LA.  . Diabetic peripheral neuropathy   . CKD (chronic kidney disease), stage III   . CHF (congestive heart failure)   . Pneumonia   . Sleep apnea     Past Surgical History  Procedure Laterality Date  . Eye sx  11/11/2011    left eye  . Cardioversion  11/18/2011    Procedure: CARDIOVERSION;  Surgeon: Josue Hector, MD;  Location: [facility] ENDOSCOPY;  Service: Cardiovascular;  Laterality: N/A;  . Colonoscopy w/ biopsies and polypectomy    . Av fistula placement Left 02/13/2014    Procedure: INSERTION OF ARTERIOVENOUS (AV) GORE-TEX GRAFT ARM;  Surgeon: Angelia Mould, MD;  Location: [facility];  Service: Vascular;  Laterality: Left;  . Right heart catheterization N/A 10/11/2013    Procedure: RIGHT HEART CATH;  Surgeon: Larey Dresser, MD;  Location: [facility] CATH LAB;  Service: Cardiovascular;  Laterality: N/A;    Family History  Problem Relation Age of Onset  . Heart disease Mother     before age 87  . Diabetes Father   . Diabetes Sister   . Peripheral vascular disease Sister     amputation  . Cancer Neg Hx   . Stroke Neg Hx     Social History:  Married. Independent prior to admission in July.  reports that he quit smoking about 20 years ago. His smoking use included Cigarettes. He has a .05 pack-year smoking history. He has never used smokeless tobacco. He reports that he does not drink alcohol or use illicit drugs.    Allergies  Allergen Reactions  . Keflex [Cephalexin] Other (See Comments)    c-diff reaction  . Dilaudid [Hydromorphone] Nausea And Vomiting    Medications Prior to Admission  Medication Sig Dispense Refill  . atorvastatin (LIPITOR) 40 MG tablet Take 40 mg by mouth at  bedtime.     . calcitRIOL (ROCALTROL) 0.25 MCG capsule Take 7 capsules (1.75 mcg total) by mouth every Monday, Wednesday, and Friday with hemodialysis.    Marland Kitchen colchicine 0.6 MG tablet TAKE 1 TABLET (0.6 MG TOTAL) BY MOUTH 2 (TWO) TIMES DAILY. 60 tablet 2  . diltiazem (CARDIZEM CD) 120 MG 24 hr capsule Take 1 capsule (120 mg total) by mouth daily. 30 capsule 0  . ethyl chloride spray Apply 1 application topically every other day. Mon Wed Fri - done with dialysis  6  . fluocinonide ointment (LIDEX) 0.05 % Apply topically 4 (four) times daily. 30 g 0  . hydrocerin (EUCERIN) CREA Apply 1 application topically 4 (four) times daily.  0  . insulin aspart (NOVOLOG) 100 UNIT/ML injection Before each meal 3 times a day, 140-199 - 2 units, 200-250 - 4 units, 251-299 - 6 units,  300-349 - 8 units,  350 or above 10 units. 10 mL 11  . Insulin Glargine (LANTUS SOLOSTAR) 100 UNIT/ML Solostar Pen Inject 20 Units into the skin 2 (two) times daily. 15 mL 11  . levocetirizine (XYZAL) 5 MG tablet Take 5 mg by mouth every evening.    . metoprolol tartrate (LOPRESSOR) 50 MG tablet Take 1 tablet (50 mg total) by mouth 2 (two) times daily.    . Nutritional Supplements (FEEDING SUPPLEMENT, NEPRO CARB STEADY,) LIQD Take 237 mLs by mouth 2 (two) times daily between meals. 20 Can 0  . ondansetron (ZOFRAN) 4 MG tablet Take 4 mg by mouth 3 (three) times daily as needed for nausea or vomiting.    . pantoprazole (PROTONIX) 20 MG tablet Take 1 tablet (20 mg total) by mouth 2 (two) times daily.    . phenytoin (DILANTIN) 300 MG ER capsule Take 1 capsule (300 mg total) by mouth at bedtime. (Patient taking differently: Take 300 mg by mouth every morning. )    . saccharomyces boulardii (FLORASTOR) 250 MG capsule Take 1 capsule (250 mg total) by mouth 2 (two) times daily. (Patient taking differently: Take 250 mg by mouth daily. ) 30 capsule 0  . sevelamer carbonate (RENVELA) 800 MG tablet Take 2 tablets (1,600 mg total) by mouth 3 (three)  times daily with meals.    . warfarin (COUMADIN) 5 MG tablet Take 1 tablet (5 mg total) by mouth daily. 30 tablet 0  . enoxaparin (LOVENOX) 150 MG/ML injection Inject 0.55 mLs (85 mg total) into the skin daily. Stopped once INR reaches 2. 0 Syringe     Home: Home Living Family/patient expects to be discharged to:: Private residence Living Arrangements: Spouse/significant other Available Help at Discharge: Family Type of Home: House Home Access: Level entry Home Layout: One level Bathroom Shower/Tub: Chiropodist: Standard Home Equipment: Kasandra Knudsen - single point, Environmental consultant - 2 wheels  Functional History: Prior Function Level of Independence: Needs assistance ADL's / Homemaking Assistance Needed: [facility] 11/30/14 d/c to [facility] with (A) for adls Functional Status:  Mobility: Bed  Mobility Overal bed mobility: Needs Assistance Bed Mobility: Sit to Supine Supine to sit: Min guard General bed mobility comments: used momentum to get to EOB Transfers Overall transfer level: Needs assistance Equipment used: Rolling walker (2 wheeled) Transfers: Sit to/from Stand, Technical brewer Transfers Sit to Stand: Max assist Stand pivot transfers: Max assist General transfer comment: requires use of hands; assisted to come forward and for lift      ADL: ADL Overall ADL's : Needs assistance/impaired Grooming: Wash/dry face, Wash/dry hands, Applying deodorant, Min guard, Sitting Grooming Details (indicate cue type and reason): requires rest breaks and cues for pursed lip breathing. HR reaching 118  Upper Body Bathing: Minimal assitance, Sitting Upper Body Bathing Details (indicate cue type and reason): requires rest breaks Lower Body Bathing: Moderate assistance, Sit to/from stand Upper Body Dressing : Moderate assistance, Sitting Toilet Transfer: Maximal assistance, BSC (requires use of hands) General ADL Comments: Pt in chair on arrival and transfered in chair to sink level. Pt  remained seated for bathing session for energy conservation and to monitor HR closely. HR reaches max 118 during session. pt sit<>Stand only for peri care. Pt has x3 lotions/ creams for back rash that wife provided.Pt has vasculine for buttock . wife and patient educated on pressure relief while in chair  Cognition: Cognition Overall Cognitive Status: Impaired/Different from baseline Orientation Level: Oriented to person, Oriented to place, Disoriented to time, Disoriented to situation Cognition Arousal/Alertness: Awake/alert Behavior During Therapy: WFL for tasks assessed/performed Overall Cognitive Status: Impaired/Different from baseline Area of Impairment: Orientation, Memory, Following commands, Safety/judgement, Awareness Orientation Level: Disoriented to, Place, Situation (I know I am in [place]) Current Attention Level: Selective Memory: Decreased recall of precautions, Decreased short-term memory Following Commands: Follows one step commands with increased time, Follows multi-step commands inconsistently Safety/Judgement: Decreased awareness of safety, Decreased awareness of deficits Awareness: Intellectual Problem Solving: Slow processing, Decreased initiation General Comments: "i am a little confused" - Pt able to state location as [facility] month as August 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 Pt however reports the year is 2015, unknown type of building or reason for admission.    Blood pressure 104/64, pulse 109, temperature 98.1 F (36.7 C), temperature source Oral, resp. rate 24, height 6' (1.829 m), weight 80.5 kg (177 lb 7.5 oz), SpO2 95 %. Physical Exam  Nursing note and vitals reviewed. Constitutional: He appears well-developed and well-nourished.  In HD and complaining of chills.   HENT:  Head: Normocephalic and atraumatic.  Eyes: Conjunctivae are normal. Pupils are equal, round, and reactive to light.  Neck: Normal range of motion. Neck supple.  Cardiovascular: An irregular  rhythm present. Tachycardia present.   Respiratory: Effort normal. He has decreased breath sounds. He has no wheezes. He exhibits no tenderness.  GI: Soft. Bowel sounds are normal. He exhibits no distension. There is no tenderness.  Musculoskeletal: He exhibits no edema or tenderness.  Neurological: He is alert.  Oriented to self only. Place - "HD center". He was able to state age and DOB but unable to recall events of past few weeks.  Able to follow simple one and two step motor commands.   Skin: Skin is warm and dry.  Psychiatric: His affect is blunt. He is slowed. Cognition and memory are impaired. He exhibits abnormal recent memory and abnormal remote memory.  Motor strength is 4 minus/5 in the right deltoid, biceps, triceps, grip Left grip is 4/5, proximally not tested secondary to ongoing hemodialysis Lower extremity strength 4 minus hip flexor knee extensor ankle  dorsiflexor plantar flexor Sensation diminished bilateral feet to light touch  Results for orders placed or performed during the hospital encounter of 12/09/14 (from the past 24 hour(s))  Glucose, capillary     Status: Abnormal   Collection Time: 12/11/14  7:25 AM  Result Value Ref Range   Glucose-Capillary 190 (H) 65 - 99 mg/dL   Comment 1 Notify RN    Comment 2 Document in Chart   Glucose, capillary     Status: Abnormal   Collection Time: 12/11/14  1:21 PM  Result Value Ref Range   Glucose-Capillary 261 (H) 65 - 99 mg/dL  Glucose, capillary     Status: Abnormal   Collection Time: 12/11/14  5:57 PM  Result Value Ref Range   Glucose-Capillary 234 (H) 65 - 99 mg/dL   Comment 1 Notify RN    Comment 2 Document in Chart   Glucose, capillary     Status: Abnormal   Collection Time: 12/11/14  9:12 PM  Result Value Ref Range   Glucose-Capillary 250 (H) 65 - 99 mg/dL   No results found.  Assessment/Plan: Diagnosis: Deconditioning related to pneumonia in a patient with limbic encephalitis and severe cognitive  deficits 1. Does the need for close, 24 hr/day medical supervision in concert with the patient's rehab needs make it unreasonable for this patient to be served in a less intensive setting? Potentially 2. Co-Morbidities requiring supervision/potential complications: A. Fib with rapid ventricular response, diabetes type 2 with neuropathy uncontrolled, end-stage renal disease on hemodialysis 3. Due to bowel management, safety, skin/wound care, disease management, medication administration, pain management and patient education, does the patient require 24 hr/day rehab nursing? Potentially 4. Does the patient require coordinated care of a physician, rehab nurse, PT (1-2 hrs/day, 55 days/week), OT (1-2 hrs/day, 5 days/week) and SLP (0.5-1 hrs/day, 5 days/week) to address physical and functional deficits in the context of the above medical diagnosis(es)? Yes Addressing deficits in the following areas: balance, endurance, locomotion, strength, transferring, bowel/bladder control, bathing, dressing, feeding, grooming, toileting, cognition and psychosocial support 5. Can the patient actively participate in an intensive therapy program of at least 3 hrs of therapy per day at least 5 days per week? Potentially 6. The potential for patient to make measurable gains while on inpatient rehab is good and fair 7. Anticipated functional outcomes upon discharge from inpatient rehab are supervision  with PT, min assist with OT, min assist with SLP. 8. Estimated rehab length of stay to reach the above functional goals is: 14-21 days 9. Does the patient have adequate social supports and living environment to accommodate these discharge functional goals? Yes 10. Anticipated D/C setting: Home 11. Anticipated post D/C treatments: [place] therapy 12. Overall Rehab/Functional Prognosis: fair  RECOMMENDATIONS: This patient's condition is appropriate for continued rehabilitative care in the following setting: CIR once medically  stable Patient has agreed to participate in recommended program. N/A and patient is confused cannot give consent Note that insurance prior authorization may be required for reimbursement for recommended care.  Comment: Needs control of medical issues specifically A. Fib with rapid ventricular response prior to CIR    12/12/2014

## 2014-12-12 NOTE — Progress Notes (Addendum)
I met with pt at bedside earlier this afternoon. I contacted his wife to discuss options for rehab pending Insurance approval. She is unsure if [facility] is his primary insurance vs Medicare. Wife works fulltime but her Mother is able and can provide assistance at home along with hired caregivers to provide the 24/7 supervision when he eventually discharges home. Pt recently discharged to [facility] for the past week and admitted directly from SNF. She prefers inpt rehab rather than return to SNF. I will follow up tomorrow. [phone]

## 2014-12-13 ENCOUNTER — Inpatient Hospital Stay (HOSPITAL_COMMUNITY): Payer: 59

## 2014-12-13 DIAGNOSIS — I4891 Unspecified atrial fibrillation: Secondary | ICD-10-CM

## 2014-12-13 LAB — CBC
HEMATOCRIT: 25.9 % — AB (ref 39.0–52.0)
Hemoglobin: 8.3 g/dL — ABNORMAL LOW (ref 13.0–17.0)
MCH: 30 pg (ref 26.0–34.0)
MCHC: 32 g/dL (ref 30.0–36.0)
MCV: 93.5 fL (ref 78.0–100.0)
Platelets: 260 10*3/uL (ref 150–400)
RBC: 2.77 MIL/uL — ABNORMAL LOW (ref 4.22–5.81)
RDW: 18.7 % — AB (ref 11.5–15.5)
WBC: 13 10*3/uL — AB (ref 4.0–10.5)

## 2014-12-13 LAB — GLUCOSE, CAPILLARY
Glucose-Capillary: 240 mg/dL — ABNORMAL HIGH (ref 65–99)
Glucose-Capillary: 245 mg/dL — ABNORMAL HIGH (ref 65–99)
Glucose-Capillary: 265 mg/dL — ABNORMAL HIGH (ref 65–99)

## 2014-12-13 LAB — PROTIME-INR
INR: 1.78 — ABNORMAL HIGH (ref 0.00–1.49)
Prothrombin Time: 20.7 seconds — ABNORMAL HIGH (ref 11.6–15.2)

## 2014-12-13 LAB — BASIC METABOLIC PANEL
ANION GAP: 15 (ref 5–15)
BUN: 24 mg/dL — ABNORMAL HIGH (ref 6–20)
CO2: 24 mmol/L (ref 22–32)
Calcium: 7.9 mg/dL — ABNORMAL LOW (ref 8.9–10.3)
Chloride: 93 mmol/L — ABNORMAL LOW (ref 101–111)
Creatinine, Ser: 4.22 mg/dL — ABNORMAL HIGH (ref 0.61–1.24)
GFR calc Af Amer: 16 mL/min — ABNORMAL LOW (ref >60)
GFR calc non Af Amer: 14 mL/min — ABNORMAL LOW (ref >60)
GLUCOSE: 218 mg/dL — AB (ref 65–99)
POTASSIUM: 3.8 mmol/L (ref 3.5–5.1)
Sodium: 132 mmol/L — ABNORMAL LOW (ref 135–145)

## 2014-12-13 MED ORDER — METOPROLOL TARTRATE 50 MG PO TABS
50.0000 mg | ORAL_TABLET | Freq: Two times a day (BID) | ORAL | Status: DC
  Administered 2014-12-13: 50 mg via ORAL
  Filled 2014-12-13 (×3): qty 1

## 2014-12-13 MED ORDER — WARFARIN SODIUM 5 MG PO TABS
5.0000 mg | ORAL_TABLET | Freq: Once | ORAL | Status: AC
  Administered 2014-12-13: 5 mg via ORAL
  Filled 2014-12-13: qty 1

## 2014-12-13 MED ORDER — AMIODARONE HCL 200 MG PO TABS
400.0000 mg | ORAL_TABLET | Freq: Every day | ORAL | Status: DC
  Administered 2014-12-13 – 2014-12-26 (×14): 400 mg via ORAL
  Filled 2014-12-13 (×14): qty 2

## 2014-12-13 MED ORDER — DIPHENHYDRAMINE HCL 25 MG PO CAPS
50.0000 mg | ORAL_CAPSULE | Freq: Once | ORAL | Status: AC
  Administered 2014-12-13: 50 mg via ORAL
  Filled 2014-12-13: qty 2

## 2014-12-13 NOTE — Progress Notes (Signed)
PULMONARY / CRITICAL CARE MEDICINE   Name: Joseph [name] MRN: [id number] DOB: [birth year]    ADMISSION DATE:  12/09/2014 CONSULTATION DATE:  12/10/2014  REFERRING MD :  Daleen Bo ([facility])   CHIEF COMPLAINT:  Sepsis, hemoptysis   INITIAL PRESENTATION:  59 yo male with fever, cough and mild hemoptysis from LLL HCAP.  Has hx of A fib on coumadin.  Had recent admission (7/31 to 8/14) for encephalopathy with limbic encephalitis.  Has hx of HTN, DM, ESRD, recurrent C diff.  STUDIES:  8/22 CT chest >> LLL ASD with air bronchograms 8/24 EEG >> moderate generalized slowing  SIGNIFICANT EVENTS: 8/21 Admit 8/24 Neurology, cardiology consulted  SUBJECTIVE:  Still has cough with sputum.  No further episodes of hemoptysis.  Wife reports intermittent arm twitching.  He is having trouble sleeping in hospital.  VITAL SIGNS: Temp:  [98.8 F (37.1 C)-100.9 F (38.3 C)] 99.4 F (37.4 C) (08/24 2230) Pulse Rate:  [54-134] 94 (08/25 0600) Resp:  [21-37] 27 (08/25 0600) BP: (103-139)/(56-109) 116/68 mmHg (08/25 0600) SpO2:  [82 %-99 %] 99 % (08/25 0600) Weight:  [188 lb 4.4 oz (85.4 kg)] 188 lb 4.4 oz (85.4 kg) (08/24 1202)  INTAKE / OUTPUT:  Intake/Output Summary (Last 24 hours) at 12/13/14 0808 Last data filed at 12/13/14 0700  Gross per 24 hour  Intake  987.2 ml  Output   1000 ml  Net  -12.8 ml    PHYSICAL EXAMINATION: General: sitting in bed Neuro: sleepy, garbled speech, follows commands HEENT: no sinus tenderness Cardiovascular: irregular Lungs: decreased BS at Lt base, no wheeze Abdomen: soft, non tender Musculoskeletal: 1+ edema   LABS:  CBC  Recent Labs Lab 12/11/14 0250 12/12/14 0804 12/13/14 0429  WBC 15.9* 11.7* 13.0*  HGB 8.7* 8.2* 8.3*  HCT 27.2* 24.7* 25.9*  PLT 189 231 260   Coag's  Recent Labs Lab 12/11/14 0250 12/12/14 0804 12/13/14 0429  INR 1.65* 1.61* 1.78*   BMET  Recent Labs Lab 12/11/14 0250 12/12/14 0804 12/13/14 0429  NA 133* 127* 132*   K 4.2 3.7 3.8  CL 97* 90* 93*  CO2 24 23 24   BUN 29* 40* 24*  CREATININE 5.02* 6.37* 4.22*  GLUCOSE 223* 206* 218*   Electrolytes  Recent Labs Lab 12/10/14 1616 12/10/14 2100 12/11/14 0250 12/12/14 0804 12/13/14 0429  CALCIUM 7.7* 7.6* 7.7* 7.9* 7.9*  PHOS 3.1 2.6  --   --   --    Sepsis Markers  Recent Labs Lab 12/09/14 2039 12/09/14 2353 12/10/14 0440 12/12/14 0804  LATICACIDVEN 1.95 1.37  --   --   PROCALCITON  --   --  22.33 30.22   Liver Enzymes  Recent Labs Lab 12/09/14 2030 12/10/14 0440 12/10/14 1616 12/10/14 2100  AST 70* 62*  --   --   ALT 55 55  --   --   ALKPHOS 280* 274*  --   --   BILITOT 1.3* 1.9*  --   --   ALBUMIN 1.9* 1.9* 1.7* 1.7*   Cardiac Enzymes  Recent Labs Lab 12/10/14 0440  TROPONINI 0.10*   Glucose  Recent Labs Lab 12/11/14 1321 12/11/14 1757 12/11/14 2112 12/12/14 1311 12/12/14 1645 12/12/14 2139  GLUCAP 261* 234* 250* 154* 227* 240*    Imaging US Scrotum  12/12/2014   CLINICAL DATA:  End-stage renal disease. Pain. Acute encephalopathy and confusion.  EXAM: ULTRASOUND OF SCROTUM  TECHNIQUE: Complete ultrasound examination of the testicles, epididymis, and other scrotal structures was performed.  COMPARISON:  CT abdomen and pelvis 12/10/2014  FINDINGS: Right testicle  Measurements: 3.1 x 1.6 x 2.4 cm. No mass or microlithiasis visualized.  Left testicle  Measurements: 1.9 x 1.8 x 2.3 cm. Focal intra testicular calcification measuring 1.2 mm, likely benign. No mass identified.  Right epididymis:  Normal in size and appearance.  Left epididymis:  Normal in size and appearance.  Hydrocele:  None visualized.  Varicocele:  Borderline left scrotal varicoceles demonstrated.  Color flow Doppler images demonstrate normal homogeneous flow to both testes.  IMPRESSION: Focal testicular calcification on the left. No mass or parenchymal distortion in either testis. No evidence of epididymal orchitis. Borderline left scrotal varicoceles.    Electronically Signed   By: Lucienne Capers M.D.   On: 12/12/2014 20:27   Dg Chest Port 1 View  12/12/2014   CLINICAL DATA:  Health care associated pneumonia.  EXAM: PORTABLE CHEST - 1 VIEW  COMPARISON:  December 09, 2014.  FINDINGS: Stable cardiomediastinal silhouette. No pneumothorax is noted. Right lung is clear. Stable left basilar opacity is noted most consistent with pneumonia. No significant pleural effusion is noted. Bony thorax appears intact.  IMPRESSION: Stable left basilar opacity is noted most consistent with pneumonia. Continued radiographic follow-up is recommended.   Electronically Signed   By: Marijo Conception, M.D.   On: 12/12/2014 07:24   CULTURES: Blood 8/21 >> C diff 8/22 >> Ag positive, toxin negative >> colonization   ASSESSMENT / PLAN:  Acute hypoxic respiratory failure 2nd to Lt lower lung HCAP with hemoptysis in setting of chronic coumadin therapy. Plan: - Day 4 of vancomycin, zosyn - f/u CXR intermittently to document clearing of infiltrate - oxygen to keep SpO2 > 92% - f/u procalcitonin  Chronic a fib. Plan: - per primary team and cardiology  Limbic encephalitis. Plan: - per primary team and neurology  Recurrent C diff >> Ag positive/toxin negative from 8/22 >> colonization Plan: - monitor off therapy  ESRD. Plan: - per renal  Updated pt's wife at bedside.  PCCM will sign off.  Please call if additional help is needed.  Chesley Mires, MD [facility] Pulmonary/Critical Care 12/13/2014, 8:08 AM Pager:  [phone] After 3pm call: [phone]

## 2014-12-13 NOTE — Progress Notes (Signed)
Subjective:  Hd yesterday on schedule /pt pleasantly  confused not remembering HD yest   Objective Vital signs in last 24 hours: Filed Vitals:   12/13/14 0400 12/13/14 0600 12/13/14 1048 12/13/14 1139  BP: 109/75 116/68 138/88 139/77  Pulse: 75 94 113 63  Temp:    99 F (37.2 C)  TempSrc:    Oral  Resp: 33 27  28  Height:      Weight:      SpO2: 97% 99%  95%   Weight change: 4.9 kg (10 lb 12.9 oz)  Physical Exam: General: alert and no distress,pleasanly confused    Lungs=Chest clear bilat Card =Ireeg, irreg  VR stable in 90s  no MRG  ABD= Abd soft, no ascites or hsm  Extrem =No LE or UE edema  Dialysis Acess=L arm AVG +bruit    OP HD= [facility] 4 hrs 80kgs 3K/2.5Ca No Heparin Profile 2 Micera 100 q 2 weeks- to start 8/24 Calcitriol 1.75  Problem/Plan: 1. ESRD HD  On scheule MWF /am k 3.8 2. Fever/ HCAP - on abx, afeb  3. 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 Afib on iv dilt/ amio, dc MTP 5. Acute encephalopathy/ involved with  underlying sepsis versus ongoing encephalitis. - EVAL  per [place] in process  6. Volume/ HTN - yest at edw and today above edw per bedwts  139/77 bp  7. 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 Need to get vol down this week.  Kelly Splinter MD pager [phone]    cell [phone] 12/13/2014, 2:06 PM    Labs: Basic Metabolic Panel:  Recent Labs Lab 12/10/14 1616 12/10/14 2100 12/11/14 0250 12/12/14 0804 12/13/14 0429  NA 130* 134* 133* 127* 132*  K 3.8 4.2 4.2 3.7 3.8  CL 96* 97* 97* 90* 93*  CO2 23 23 24 23 24   GLUCOSE 221* 196* 223* 206* 218*  BUN 41* 26* 29* 40* 24*  CREATININE 6.62* 4.64* 5.02* 6.37* 4.22*  CALCIUM 7.7* 7.6* 7.7* 7.9* 7.9*  PHOS 3.1 2.6  --   --   --    Liver Function Tests:  Recent Labs Lab 12/09/14 2030 12/10/14 0440  12/10/14 1616 12/10/14 2100  AST 70* 62*  --   --   ALT 55 55  --   --   ALKPHOS 280* 274*  --   --   BILITOT 1.3* 1.9*  --   --   PROT 5.7* 6.2*  --   --   ALBUMIN 1.9* 1.9* 1.7* 1.7*    Recent Labs Lab 12/10/14 0440  LIPASE 38   No results for input(s): AMMONIA in the last 168 hours. CBC:  Recent Labs Lab 12/09/14 2030  12/10/14 1616 12/10/14 2100 12/11/14 0250 12/12/14 0804 12/13/14 0429  WBC 14.8*  < > 15.2* 15.6* 15.9* 11.7* 13.0*  NEUTROABS 12.7*  --  13.0*  --   --   --   --   HGB 8.6*  < > 8.4* 9.2* 8.7* 8.2* 8.3*  HCT 26.4*  < > 26.1* 28.6* 27.2* 24.7* 25.9*  MCV 96.4  < > 94.9 96.3 96.1 92.5 93.5  PLT 157  < > 168 182 189 231 260  < > = values in this interval not displayed. Cardiac Enzymes:  Recent Labs Lab 12/10/14 0440  TROPONINI 0.10*   CBG:  Recent Labs Lab 12/11/14 2112 12/12/14 1311 12/12/14 1645 12/12/14 2139 12/13/14 0823  GLUCAP 250* 154* 227* 240* 240*    Studies/Results: US Scrotum  12/12/2014   CLINICAL DATA:  End-stage renal disease. Pain. Acute encephalopathy and confusion.  EXAM: ULTRASOUND OF SCROTUM  TECHNIQUE: Complete ultrasound examination of the testicles, epididymis, and other scrotal structures was performed.  COMPARISON:  CT abdomen and pelvis 12/10/2014  FINDINGS: Right testicle  Measurements: 3.1 x 1.6 x 2.4 cm. No mass or microlithiasis visualized.  Left testicle  Measurements: 1.9 x 1.8 x 2.3 cm. Focal intra testicular calcification measuring 1.2 mm, likely benign. No mass identified.  Right epididymis:  Normal in size and appearance.  Left epididymis:  Normal in size and appearance.  Hydrocele:  None visualized.  Varicocele:  Borderline left scrotal varicoceles demonstrated.  Color flow Doppler images demonstrate normal homogeneous flow to both testes.  IMPRESSION: Focal testicular calcification on the left. No mass or parenchymal distortion in either testis. No evidence of epididymal orchitis. Borderline left scrotal  varicoceles.   Electronically Signed   By: Lucienne Capers M.D.   On: 12/12/2014 20:27   Dg Chest Port 1 View  12/12/2014   CLINICAL DATA:  Health care associated pneumonia.  EXAM: PORTABLE CHEST - 1 VIEW  COMPARISON:  December 09, 2014.  FINDINGS: Stable cardiomediastinal silhouette. No pneumothorax is noted. Right lung is clear. Stable left basilar opacity is noted most consistent with pneumonia. No significant pleural effusion is noted. Bony thorax appears intact.  IMPRESSION: Stable left basilar opacity is noted most consistent with pneumonia. Continued radiographic follow-up is recommended.   Electronically Signed   By: Marijo Conception, M.D.   On: 12/12/2014 07:24   Medications: . amiodarone 30 mg/hr (12/13/14 3094)  . diltiazem (CARDIZEM) infusion 15 mg/hr (12/13/14 0641)   . atorvastatin  40 mg Oral QHS  . calcitRIOL  1.75 mcg Oral Q M,W,F-HD  . colchicine  0.6 mg Oral Daily  . darbepoetin (ARANESP) injection - DIALYSIS  100 mcg Intravenous Q Mon-HD  . feeding supplement (NEPRO CARB STEADY)  237 mL Oral BID BM  . fluocinonide ointment   Topical QID  . insulin aspart  0-9 Units Subcutaneous TID WC  . metoprolol tartrate  25 mg Oral BID  . multivitamin  1 tablet Oral QHS  . phenytoin  300 mg Oral q morning - 10a  . piperacillin-tazobactam (ZOSYN)  IV  2.25 g Intravenous Q8H  . saccharomyces boulardii  250 mg Oral BID  . sodium chloride  3 mL Intravenous Q12H  . vancomycin  1,000 mg Intravenous Q M,W,F-HD  . Warfarin - Pharmacist Dosing Inpatient   Does not apply [phone]

## 2014-12-13 NOTE — Care Management Important Message (Signed)
Important Message  Patient Details  Name: Joseph [name] MRN: [id number] Date of Birth: 1956/03/26   Medicare Important Message Given:  [facility] notification given    Nathen May 12/13/2014, 11:05 AMImportant Message  Patient Details  Name: [name] MRN: [id number] Date of Birth: 1956-02-23   Medicare Important Message Given:  Yes-second notification given    Nathen May 12/13/2014, 11:05 AM

## 2014-12-13 NOTE — Progress Notes (Signed)
LTM hooked up and running. 

## 2014-12-13 NOTE — Progress Notes (Signed)
Patient Name: Joseph [name] Date of Encounter: 12/13/2014  Principal Problem:   Sepsis Active Problems:   Atrial fibrillation-permanent   Essential hypertension   Gout   HCAP (healthcare-associated pneumonia)   ESRD on hemodialysis   Nausea vomiting and diarrhea   Pneumonia   Atrial fibrillation with RVR   Encephalopathy acute   Debility  SUBJECTIVE  Feels better. Denies chest pain, sob or palpitation. Rate improved on lopressor.   CURRENT MEDS . atorvastatin  40 mg Oral QHS  . calcitRIOL  1.75 mcg Oral Q M,W,F-HD  . colchicine  0.6 mg Oral Daily  . darbepoetin (ARANESP) injection - DIALYSIS  100 mcg Intravenous Q Mon-HD  . feeding supplement (NEPRO CARB STEADY)  237 mL Oral BID BM  . fluocinonide ointment   Topical QID  . insulin aspart  0-9 Units Subcutaneous TID WC  . metoprolol tartrate  25 mg Oral BID  . multivitamin  1 tablet Oral QHS  . phenytoin  300 mg Oral q morning - 10a  . piperacillin-tazobactam (ZOSYN)  IV  2.25 g Intravenous Q8H  . saccharomyces boulardii  250 mg Oral BID  . sodium chloride  3 mL Intravenous Q12H  . vancomycin  1,000 mg Intravenous Q M,W,F-HD  . Warfarin - Pharmacist Dosing Inpatient   Does not apply q1800    OBJECTIVE  Filed Vitals:   12/13/14 0400 12/13/14 0600 12/13/14 1048 12/13/14 1139  BP: 109/75 116/68 138/88 139/77  Pulse: 75 94 113 63  Temp:    99 F (37.2 C)  TempSrc:    Oral  Resp: 33 27  28  Height:      Weight:      SpO2: 97% 99%  95%    Intake/Output Summary (Last 24 hours) at 12/13/14 1229 Last data filed at 12/13/14 0700  Gross per 24 hour  Intake  987.2 ml  Output      0 ml  Net  987.2 ml   Filed Weights   12/10/14 1500 12/11/14 2305 12/12/14 1202  Weight: 183 lb 3.2 oz (83.1 kg) 177 lb 7.5 oz (80.5 kg) 188 lb 4.4 oz (85.4 kg)    PHYSICAL EXAM  General: Pleasant, NAD. Neuro: Alert and oriented X 3. Moves all extremities spontaneously. Psych: Normal affect. HEENT:  Normal  Neck: Supple without  bruits or JVD. Lungs:  Resp regular and unlabored, CTA. Heart: irregular irregular rhythm no s3, s4, or murmurs. Abdomen: Soft, non-tender, non-distended, BS + x 4.  Extremities: No clubbing, cyanosis or edema. DP/PT/Radials 2+ and equal bilaterally.  Accessory Clinical Findings  CBC  Recent Labs  12/10/14 1616  12/12/14 0804 12/13/14 0429  WBC 15.2*  < > 11.7* 13.0*  NEUTROABS 13.0*  --   --   --   HGB 8.4*  < > 8.2* 8.3*  HCT 26.1*  < > 24.7* 25.9*  MCV 94.9  < > 92.5 93.5  PLT 168  < > 231 260  < > = values in this interval not displayed. Basic Metabolic Panel  Recent Labs  12/10/14 1616 12/10/14 2100  12/12/14 0804 12/13/14 0429  NA 130* 134*  < > 127* 132*  K 3.8 4.2  < > 3.7 3.8  CL 96* 97*  < > 90* 93*  CO2 23 23  < > 23 24  GLUCOSE 221* 196*  < > 206* 218*  BUN 41* 26*  < > 40* 24*  CREATININE 6.62* 4.64*  < > 6.37* 4.22*  CALCIUM 7.7* 7.6*  < >  7.9* 7.9*  PHOS 3.1 2.6  --   --   --   < > = values in this interval not displayed. Liver Function Tests  Recent Labs  12/10/14 1616 12/10/14 2100  ALBUMIN 1.7* 1.7*   TELE  afib at rate of 70-90s. Frequent PVCs.   ASSESSMENT AND PLAN    1. Permanent Afib - His rate was high on IV dilt (66ml/hr) and IV amio. - His rate was improved since addition of lopressor 25mg  BID yesterday. Rate in 70-90s.  - Consider switching either IV dilt or IV amio to PO, if does better, switch another to PO.  - Coumadin per pharmacy for anticoagulation.   2. ESRD on hemodialysis Renal following. Hemodialysis as scheduled  3. HTN - currently stable  4. Acute encephalopathy - per neuro  Signed, Bhagat,Bhavinkumar PA-C   I have seen and examined the patient along with Bhagat,Bhavinkumar PA-C.  I have reviewed the chart, notes and new data.  I agree with PA's note.  Substantial improvement in heart rate control  PLAN: Change amiodarone to PO. Note normal TSH. Plan to change diltiazem to PO as well in AM. Blood  pressure is much better. Increase metoprolol back to home dose of 50 mg BID.  Ideally amiodarone will be stopped prior to discharge.  Sanda Klein, MD, [place] [phone] 12/13/2014, 2:22 PM

## 2014-12-13 NOTE — Progress Notes (Signed)
Inpatient Diabetes Program Recommendations  AACE/ADA: New Consensus Statement on Inpatient Glycemic Control (2013)  Target Ranges:  Prepandial:   less than 140 mg/dL      Peak postprandial:   less than 180 mg/dL (1-2 hours)      Critically ill patients:  140 - 180 mg/dL    Results for CAN, LUCCI (MRN [id number]) as of 12/13/2014 10:56  Ref. Range 12/12/2014 13:11 12/12/2014 16:45 12/12/2014 21:39  Glucose-Capillary Latest Ref Range: 65-99 mg/dL 154 (H) 227 (H) 240 (H)    Results for OAKES, MCCREADY (MRN [id number]) as of 12/13/2014 10:56  Ref. Range 12/13/2014 08:23  Glucose-Capillary Latest Ref Range: 65-99 mg/dL 240 (H)    Home DM Meds: Lantus 20 units bid  Novolog 2-10 units tid per SSI  Current DM Orders: Novolog Sensitive SSI (0-9 units) TID AC     MD-- Please consider starting 50% of home dose of Lantus-  Lantus 10 units bid    Will follow Wyn Quaker RN, MSN, CDE Diabetes Coordinator Inpatient Glycemic Control Team Team Pager: [phone] (8a-5p)

## 2014-12-13 NOTE — Progress Notes (Signed)
PT Cancellation Note  Patient Details Name: Joseph [name] MRN: [id number] DOB: October 16, 1955   Cancelled Treatment:    Reason Eval/Treat Not Completed: Patient not medically ready;Other (comment) (Started on 24 hour EEG and therefore not able to participate with therapy today. 12/13/2014  Donnella Sham, [place] [phone]  (pager)   Ihor Meinzer, Tessie Fass 12/13/2014, 2:27 PM

## 2014-12-13 NOTE — Progress Notes (Signed)
ANTICOAGULATION and ANTIBIOTIC CONSULT NOTE - Follow Up Consult  Pharmacy Consult for Coumadin and Vancomcyin/Zosyn Indication: atrial fibrillation and R/O sepsis  Allergies  Allergen Reactions  . Keflex [Cephalexin] Other (See Comments)    c-diff reaction  . Dilaudid [Hydromorphone] Nausea And Vomiting    Patient Measurements: Height: 6' (182.9 cm) Weight: 188 lb 4.4 oz (85.4 kg) IBW/kg (Calculated) : 77.6 Heparin Dosing Weight:   Vital Signs: Temp: 99 F (37.2 C) (08/25 1139) Temp Source: Oral (08/25 1139) BP: 139/77 mmHg (08/25 1139) Pulse Rate: 63 (08/25 1139)  Labs:  Recent Labs  12/11/14 0250 12/12/14 0804 12/13/14 0429  HGB 8.7* 8.2* 8.3*  HCT 27.2* 24.7* 25.9*  PLT 189 231 260  LABPROT 19.5* 19.2* 20.7*  INR 1.65* 1.61* 1.78*  CREATININE 5.02* 6.37* 4.22*    Estimated Creatinine Clearance: 20.7 mL/min (by C-G formula based on Cr of 4.22).  Assessment: 59yo male on Coumadin for AFib.  INR 1.78 this AM, Hg 8.3/stable, and pltc wnl.  No bleeding noted.  No further hemoptysis.  She is on Amiodarone drip.    Pt is on Vancomycin and Zosyn (8/21 >> ) for R/O sepsis; ESRD with HD MWF.  Tm 100.9, WBC 11.7.  Blood cultures are ntd.    Goal of Therapy:  INR 2-3 Pre-HD Vanc 15-25 Monitor platelets by anticoagulation protocol: Yes   Plan:  Coumadin 5mg   Daily INR Pre-HD Vanc level on 8/26  Gracy Bruins, PharmD [facility]

## 2014-12-13 NOTE — Progress Notes (Addendum)
[facility] PROGRESS NOTE  Joseph [name] UDJ:[id number] DOB: 06-16-1955 DOA: 12/09/2014 PCP: Webb Silversmith, NP   Brief narrative 59 year old male with history of end-stage renal disease on dialysis (M, W, F), chronic A. fib on Coumadin, gout, type 2 diabetes mellitus with recent hospitalization for encephalopathy with MRI showing them became cephalitis. During that time he had undergone LP which was unremarkable. He did respond to IV steroid initially which was discontinued as patient had C. difficile colitis. Patient brought to the ED as he was having persistent fever for one day associated with productive cough with some hemoptysis. He also reported having nausea and vomiting with diarrhea. On presentation patient was mildly hypotensive and received 1 L IV normal saline bolus in the ED following which his heart rate and blood pressure improved. Patient appeared to have some confusion and lethargy on presentation. Patient criteria for sepsis and imaging showed left-sided pneumonia. Patient placed on empiric antibiotics and admitted to stepdown.  Assessment/Plan: Sepsis secondary to healthcare associated pneumonia On empiric vancomycin and Zosyn ( day 4) . Blood culture negative to date. Hemoptysis on presentation resolved. H&H stable. Pulmonary signed off..  Follow-up chest x-ray in 4 weeks to see for resolution.  Acute encephalopathy Associated with underlying sepsis versus ongoing encephalitis. Patient recently hospitalized for limbic encephalitis. MRI on admission showing persistent abnormal T2/FLAIR signal intensity involving mesial temporal lobes/hippocampi bilaterally which seems to have improved overall compared to MRI from 11/21/2014. -Patient remains confused off and on and occasionally hallucinating. Neurology consulted. EEG repeated and is unremarkable for acute seizures. Received IV Solu-Medrol for 5 days during recent hospitalization. LP was negative. Ultrasound of the scrotum to  rule out testicular malignancy was negative. (As per neurology to rule out paraneoplastic encephalitis). -Neurology suggest this could be post infectious versus autoimmune encephalitis. Recommend that patient may benefit from plasma exchange empirically after his infection is better controlled.  Nausea vomiting with diarrhea Stool for C. difficile  suggestive of colonization. Discontinued oral vancomycin. No further nausea or vomiting.  A. fib with RVR After returning from dialysis on 8/22. started on Cardizem drip but had low blood pressure so  it was reduced and started on amiodarone drip. Still requiring Cardizem and amiodarone drip. Cardiology consult appreciated. Heart rate better past 24 hours. - Resumed Coumadin. Echo from 08/2014 showed LVH with EF of 50-55%  ESRD on hemodialysis Renal following. Hemodialysis as scheduled  History of seizure Continue Dilantin.  Diabetes mellitus Continue with sliding scale insulin.  Protein calorie malnutrition Continue supplements  Diet: Renal/heart healthy  DVT prophylaxis: coumadin  Code Status: Full code Family Communication: Wife at bedside Disposition Plan: Continue step down monitoring   Consultants:  Renal  Pulmonary  Procedures:  Dialysis  MRI brain  Antibiotics:  IV vancomycin and Zosyn since 8/22  HPI/Subjective: Patient seen and examined. Wife reports he was unable to sleep all night. He was febrile to 100.69F overnight. No further diarrhea. Cough has improved. Appears confused.  Objective: Filed Vitals:   12/13/14 1048  BP: 138/88  Pulse: 113  Resp:     Intake/Output Summary (Last 24 hours) at 12/13/14 1128 Last data filed at 12/13/14 0700  Gross per 24 hour  Intake  987.2 ml  Output   1000 ml  Net  -12.8 ml   Filed Weights   12/10/14 1500 12/11/14 2305 12/12/14 1202  Weight: 83.1 kg (183 lb 3.2 oz) 80.5 kg (177 lb 7.5 oz) 85.4 kg (188 lb 4.4 oz)    Exam:  General:  no acute  distress  HEENT:  most oral mucosa, no JVD  Respiratory: clear to auscultation bilaterally  CVS: S1 and S2 is irregularly irregular, no murmurs rub or gallop  GI: Soft, nondistended, nontender, bowel sounds present  Musculoskeletal: Warm, no edema  CNS: Alert and oriented 1-2, confused, nonfocal  Data Reviewed: Basic Metabolic Panel:  Recent Labs Lab 12/10/14 1616 12/10/14 2100 12/11/14 0250 12/12/14 0804 12/13/14 0429  NA 130* 134* 133* 127* 132*  K 3.8 4.2 4.2 3.7 3.8  CL 96* 97* 97* 90* 93*  CO2 23 23 24 23 24   GLUCOSE 221* 196* 223* 206* 218*  BUN 41* 26* 29* 40* 24*  CREATININE 6.62* 4.64* 5.02* 6.37* 4.22*  CALCIUM 7.7* 7.6* 7.7* 7.9* 7.9*  PHOS 3.1 2.6  --   --   --    Liver Function Tests:  Recent Labs Lab 12/09/14 2030 12/10/14 0440 12/10/14 1616 12/10/14 2100  AST 70* 62*  --   --   ALT 55 55  --   --   ALKPHOS 280* 274*  --   --   BILITOT 1.3* 1.9*  --   --   PROT 5.7* 6.2*  --   --   ALBUMIN 1.9* 1.9* 1.7* 1.7*    Recent Labs Lab 12/10/14 0440  LIPASE 38   No results for input(s): AMMONIA in the last 168 hours. CBC:  Recent Labs Lab 12/09/14 2030  12/10/14 1616 12/10/14 2100 12/11/14 0250 12/12/14 0804 12/13/14 0429  WBC 14.8*  < > 15.2* 15.6* 15.9* 11.7* 13.0*  NEUTROABS 12.7*  --  13.0*  --   --   --   --   HGB 8.6*  < > 8.4* 9.2* 8.7* 8.2* 8.3*  HCT 26.4*  < > 26.1* 28.6* 27.2* 24.7* 25.9*  MCV 96.4  < > 94.9 96.3 96.1 92.5 93.5  PLT 157  < > 168 182 189 231 260  < > = values in this interval not displayed. Cardiac Enzymes:  Recent Labs Lab 12/10/14 0440  TROPONINI 0.10*   BNP (last 3 results)  Recent Labs  11/05/14 2205  BNP 3626.3*    ProBNP (last 3 results)  Recent Labs  02/20/14 1515 02/24/14 1145 11/15/14 1622  PROBNP 15468.0* 10410.0* >4940.0*    CBG:  Recent Labs Lab 12/11/14 2112 12/12/14 1311 12/12/14 1645 12/12/14 2139 12/13/14 0823  GLUCAP 250* 154* 227* 240* 240*    Recent Results  (from the past 240 hour(s))  Blood Culture (routine x 2)     Status: None (Preliminary result)   Collection Time: 12/09/14  8:20 PM  Result Value Ref Range Status   Specimen Description BLOOD RIGHT WRIST  Final   Special Requests BOTTLES DRAWN AEROBIC AND ANAEROBIC 5CC  Final   Culture NO GROWTH 3 DAYS  Final   Report Status PENDING  Incomplete  Blood Culture (routine x 2)     Status: None (Preliminary result)   Collection Time: 12/09/14  8:25 PM  Result Value Ref Range Status   Specimen Description BLOOD RIGHT HAND  Final   Special Requests BOTTLES DRAWN AEROBIC AND ANAEROBIC 4CC  Final   Culture NO GROWTH 3 DAYS  Final   Report Status PENDING  Incomplete  C difficile quick scan w PCR reflex     Status: Abnormal   Collection Time: 12/10/14  4:42 AM  Result Value Ref Range Status   C Diff antigen POSITIVE (A) NEGATIVE Final   C Diff toxin NEGATIVE NEGATIVE Final  C Diff interpretation   Final    C. difficile present, but toxin not detected. This indicates colonization. In most cases, this does not require treatment. If patient has signs and symptoms consistent with colitis, consider treatment.     Studies: US Scrotum  12/12/2014   CLINICAL DATA:  End-stage renal disease. Pain. Acute encephalopathy and confusion.  EXAM: ULTRASOUND OF SCROTUM  TECHNIQUE: Complete ultrasound examination of the testicles, epididymis, and other scrotal structures was performed.  COMPARISON:  CT abdomen and pelvis 12/10/2014  FINDINGS: Right testicle  Measurements: 3.1 x 1.6 x 2.4 cm. No mass or microlithiasis visualized.  Left testicle  Measurements: 1.9 x 1.8 x 2.3 cm. Focal intra testicular calcification measuring 1.2 mm, likely benign. No mass identified.  Right epididymis:  Normal in size and appearance.  Left epididymis:  Normal in size and appearance.  Hydrocele:  None visualized.  Varicocele:  Borderline left scrotal varicoceles demonstrated.  Color flow Doppler images demonstrate normal homogeneous flow  to both testes.  IMPRESSION: Focal testicular calcification on the left. No mass or parenchymal distortion in either testis. No evidence of epididymal orchitis. Borderline left scrotal varicoceles.   Electronically Signed   By: Lucienne Capers M.D.   On: 12/12/2014 20:27   Dg Chest Port 1 View  12/12/2014   CLINICAL DATA:  Health care associated pneumonia.  EXAM: PORTABLE CHEST - 1 VIEW  COMPARISON:  December 09, 2014.  FINDINGS: Stable cardiomediastinal silhouette. No pneumothorax is noted. Right lung is clear. Stable left basilar opacity is noted most consistent with pneumonia. No significant pleural effusion is noted. Bony thorax appears intact.  IMPRESSION: Stable left basilar opacity is noted most consistent with pneumonia. Continued radiographic follow-up is recommended.   Electronically Signed   By: Marijo Conception, M.D.   On: 12/12/2014 07:24    Scheduled Meds: . atorvastatin  40 mg Oral QHS  . calcitRIOL  1.75 mcg Oral Q M,W,F-HD  . colchicine  0.6 mg Oral Daily  . darbepoetin (ARANESP) injection - DIALYSIS  100 mcg Intravenous Q Mon-HD  . feeding supplement (NEPRO CARB STEADY)  237 mL Oral BID BM  . fluocinonide ointment   Topical QID  . insulin aspart  0-9 Units Subcutaneous TID WC  . metoprolol tartrate  25 mg Oral BID  . multivitamin  1 tablet Oral QHS  . phenytoin  300 mg Oral q morning - 10a  . piperacillin-tazobactam (ZOSYN)  IV  2.25 g Intravenous Q8H  . saccharomyces boulardii  250 mg Oral BID  . sodium chloride  3 mL Intravenous Q12H  . vancomycin  1,000 mg Intravenous Q M,W,F-HD  . Warfarin - Pharmacist Dosing Inpatient   Does not apply q1800   Continuous Infusions: . amiodarone 30 mg/hr (12/13/14 0642)  . diltiazem (CARDIZEM) infusion 15 mg/hr (12/13/14 0641)      Time spent: 35 minutes    Taylr Meuth  [facility] Pager [phone]. If 7PM-7AM, please contact night-coverage at [email or web address], password [facility] 12/13/2014, 11:28 AM  LOS: 4 days

## 2014-12-13 NOTE — Progress Notes (Signed)
Subjective: No complaints. No change over night   Exam: Filed Vitals:   12/13/14 0600  BP: 116/68  Pulse: 94  Resp: 27    HEENT-  Normocephalic, no lesions, without obvious abnormality.  Normal external eye and conjunctiva.  Normal TM's bilaterally.  Normal auditory canals and external ears. Normal external nose, mucus membranes and septum.  Normal pharynx. Cardiovascular- irregularly irregular rhythm, pulses palpable throughout   Lungs- chest clear, no wheezing, rales, normal symmetric air entry Abdomen- normal findings: bowel sounds normal Extremities- no edema     Gen: In bed, NAD MS: awake, oriented to hospital and year but not month.  Follows commands.  CN: PERRLA, EOMI, TML, FACE symmetrical Motor: MAEW Sensory: intact to PP/LT   Pertinent Labs: EEG: Interpretation: This EEG is abnormal with moderate generalized nonspecific continuous slowing of cerebral activity. No evidence of an epileptic disorder was demonstrated  Testicular US: IMPRESSION: Focal testicular calcification on the left. No mass or parenchymal distortion in either testis. No evidence of epididymal orchitis. Borderline left scrotal varicoceles.  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 He had an extensive workup including both a CSF and serum paraneoplastic panel ([facility] panel) which were negative. CSF infectious process was ruled out with LP.  EEG revealed frequent seizures at that time with BILATERAL epileptogenicity. He had a CT chest abdomen and pelvis which did not reveal any signs of malignancy. One malignancy that can be associated with paraneoplastic encephalitis is testicular cancer(via ma2 antibody) and may consider doing ultrasound scrotum.  Of note, he did not have an IgG index done on his CSF  but did have CSF IgG performed as part of his CSF Lyme titers and this showed elevated IgG, but unclear if it was isolated to the CSF.  Possibilities include a post-infectious encephalitis, other autoimmune encephalitis, new onset seizures without encephalitis(possibly related to hyperglycemia). He did have steroids earlier and there is conflicting data about whether he responded to this or not  At this point, the changes on MRI could represent injury or could represent ongoing encephalitis. Possibilities for treatment of this I feel would include immunosuppression.  He currently continues to be febrile and is being treated for both pneumonia and C. difficile colitis. Once his infection is under better control, it may be reasonable to consider plasma exchange empirically given that there are not commercially available tests for all described antibodies causing limbic encephalitis nor is there any suspicion that we have even characterized all antibodies that can be responsible for limbic encephalitis.   I would not favor repeating a course of steroids this close to his previous course.  If he is still having frequent seizures, then I would feel more strongly about pursuing PLEX.    Recommendations: 1) overnight EEG 2) will continue to follow.   Roland Rack, MD [facility] [phone]  If 7pm- 7am, please page neurology on call as listed in [place]. 12/13/2014, 9:30 AM

## 2014-12-14 LAB — CBC
HCT: 23.9 % — ABNORMAL LOW (ref 39.0–52.0)
Hemoglobin: 7.9 g/dL — ABNORMAL LOW (ref 13.0–17.0)
MCH: 30.5 pg (ref 26.0–34.0)
MCHC: 33.1 g/dL (ref 30.0–36.0)
MCV: 92.3 fL (ref 78.0–100.0)
PLATELETS: 296 10*3/uL (ref 150–400)
RBC: 2.59 MIL/uL — ABNORMAL LOW (ref 4.22–5.81)
RDW: 18.8 % — AB (ref 11.5–15.5)
WBC: 17.8 10*3/uL — AB (ref 4.0–10.5)

## 2014-12-14 LAB — GLUCOSE, CAPILLARY
GLUCOSE-CAPILLARY: 171 mg/dL — AB (ref 65–99)
GLUCOSE-CAPILLARY: 418 mg/dL — AB (ref 65–99)
Glucose-Capillary: 111 mg/dL — ABNORMAL HIGH (ref 65–99)
Glucose-Capillary: 130 mg/dL — ABNORMAL HIGH (ref 65–99)

## 2014-12-14 LAB — RENAL FUNCTION PANEL
Albumin: 1.6 g/dL — ABNORMAL LOW (ref 3.5–5.0)
Anion gap: 16 — ABNORMAL HIGH (ref 5–15)
BUN: 36 mg/dL — AB (ref 6–20)
CHLORIDE: 92 mmol/L — AB (ref 101–111)
CO2: 22 mmol/L (ref 22–32)
CREATININE: 5.87 mg/dL — AB (ref 0.61–1.24)
Calcium: 7.7 mg/dL — ABNORMAL LOW (ref 8.9–10.3)
GFR calc Af Amer: 11 mL/min — ABNORMAL LOW (ref >60)
GFR calc non Af Amer: 9 mL/min — ABNORMAL LOW (ref >60)
Glucose, Bld: 107 mg/dL — ABNORMAL HIGH (ref 65–99)
Phosphorus: 4.2 mg/dL (ref 2.5–4.6)
Potassium: 3.5 mmol/L (ref 3.5–5.1)
Sodium: 130 mmol/L — ABNORMAL LOW (ref 135–145)

## 2014-12-14 LAB — CULTURE, BLOOD (ROUTINE X 2)
Culture: NO GROWTH
Culture: NO GROWTH

## 2014-12-14 LAB — PROTIME-INR
INR: 3.14 — ABNORMAL HIGH (ref 0.00–1.49)
PROTHROMBIN TIME: 31.7 s — AB (ref 11.6–15.2)

## 2014-12-14 LAB — PROCALCITONIN: PROCALCITONIN: 20.9 ng/mL

## 2014-12-14 MED ORDER — DM-GUAIFENESIN ER 30-600 MG PO TB12
1.0000 | ORAL_TABLET | Freq: Two times a day (BID) | ORAL | Status: DC
  Filled 2014-12-14 (×3): qty 1

## 2014-12-14 MED ORDER — INSULIN ASPART 100 UNIT/ML ~~LOC~~ SOLN
5.0000 [IU] | Freq: Once | SUBCUTANEOUS | Status: AC
  Administered 2014-12-14: 5 [IU] via SUBCUTANEOUS

## 2014-12-14 MED ORDER — GUAIFENESIN ER 600 MG PO TB12
600.0000 mg | ORAL_TABLET | Freq: Two times a day (BID) | ORAL | Status: DC | PRN
  Administered 2014-12-14: 600 mg via ORAL
  Filled 2014-12-14 (×4): qty 1

## 2014-12-14 MED ORDER — METOPROLOL TARTRATE 50 MG PO TABS
75.0000 mg | ORAL_TABLET | Freq: Two times a day (BID) | ORAL | Status: DC
  Administered 2014-12-14 (×2): 75 mg via ORAL
  Filled 2014-12-14 (×4): qty 1

## 2014-12-14 NOTE — Progress Notes (Signed)
Inpatient Diabetes Program Recommendations  AACE/ADA: New Consensus Statement on Inpatient Glycemic Control (2013)  Target Ranges:  Prepandial:   less than 140 mg/dL      Peak postprandial:   less than 180 mg/dL (1-2 hours)      Critically ill patients:  140 - 180 mg/dL    Results for Joseph [name], Joseph [name] (MRN [id number]) as of 12/14/2014 08:39  Ref. Range 12/13/2014 08:23 12/13/2014 11:35 12/13/2014 15:37  Glucose-Capillary Latest Ref Range: 65-99 mg/dL 240 (H) 245 (H) 265 (H)     Home DM Meds: Lantus 20 units bid  Novolog 2-10 units tid per SSI  Current DM Orders: Novolog Sensitive SSI (0-9 units) TID AC     MD-- Please consider starting 50% of home dose of Lantus-  Lantus 10 units bid   Will follow Wyn Quaker RN, MSN, CDE Diabetes Coordinator Inpatient Glycemic Control Team Team Pager: [phone] (8a-5p)

## 2014-12-14 NOTE — Progress Notes (Addendum)
[facility] PROGRESS NOTE  DHILAN BRAUER SJG:[id number] DOB: 10-27-1955 DOA: 12/09/2014 PCP: Webb Silversmith, NP   Brief narrative 59 year old male with history of end-stage renal disease on dialysis (M, W, F), chronic A. fib on Coumadin, gout, type 2 diabetes mellitus with recent hospitalization for encephalopathy with MRI showing them became cephalitis. During that time he had undergone LP which was unremarkable. He did respond to IV steroid initially which was discontinued as patient had C. difficile colitis. Patient brought to the ED as he was having persistent fever for one day associated with productive cough with some hemoptysis. He also reported having nausea and vomiting with diarrhea. On presentation patient was mildly hypotensive and received 1 L IV normal saline bolus in the ED following which his heart rate and blood pressure improved. Patient appeared to have some confusion and lethargy on presentation. Patient criteria for sepsis and imaging showed left-sided pneumonia. Patient placed on empiric antibiotics and admitted to stepdown.  Assessment/Plan: Sepsis secondary to healthcare associated pneumonia On empiric vancomycin and Zosyn ( day 5) . Blood culture negative to date. Hemoptysis on presentation resolved. H&H stable. Pulmonary signed off..  Follow-up chest x-ray in 4 weeks to see for resolution.   Acute encephalopathy Associated with underlying sepsis versus ongoing encephalitis. Patient recently hospitalized for limbic encephalitis. MRI on admission showing persistent abnormal T2/FLAIR signal intensity involving mesial temporal lobes/hippocampi bilaterally which seems to have improved overall compared to MRI from 11/21/2014. -Patient remains confused off and on and occasionally hallucinating. Neurology consulted. EEG repeated and is unremarkable for acute seizures. Received IV Solu-Medrol for 5 days during recent hospitalization. LP was negative. Ultrasound of the scrotum to  rule out testicular malignancy was negative. (As per neurology to rule out paraneoplastic encephalitis). -Neurology suggest this could be post infectious versus autoimmune encephalitis. Recommend that patient may benefit from plasma exchange empirically after his infection is better controlled. If infection resolved by 8/29 will plan on plasmapheresis.  A. fib with RVR After returning from dialysis on 8/22. started on required cardizem and amiodarone drip. Now on metoprolol and po amiodarone. Plan to d/c amio prior to discharge.Still requiring Cardizem and amiodarone drip. Cardiology consult appreciated.  - continue  Coumadin. Echo from 08/2014 showed LVH with EF of 50-55%  Nausea vomiting with diarrhea Stool for C. difficile  suggestive of colonization. Discontinued oral vancomycin.  N/V and diarrhea resolved.    ESRD on hemodialysis Renal following. Hemodialysis as scheduled  History of seizure Continue Dilantin.  Diabetes mellitus Continue with sliding scale insulin.  Protein calorie malnutrition Continue supplements  Diet: Renal/heart healthy  DVT prophylaxis: coumadin  Code Status: Full code Family Communication: Wife at bedside Disposition Plan: Continue step down monitoring   Consultants:  Renal  Pulmonary  Procedures:  Dialysis  MRI brain  Antibiotics:  IV vancomycin and Zosyn since 8/22  HPI/Subjective: Patient seen and examined. Still quite confused. afebrile  Objective: Filed Vitals:   12/14/14 1200  BP: 106/71  Pulse: 109  Temp:   Resp:     Intake/Output Summary (Last 24 hours) at 12/14/14 1302 Last data filed at 12/14/14 0800  Gross per 24 hour  Intake  415.1 ml  Output      0 ml  Net  415.1 ml   Filed Weights   12/11/14 2305 12/12/14 1202 12/14/14 0905  Weight: 80.5 kg (177 lb 7.5 oz) 85.4 kg (188 lb 4.4 oz) 88 kg (194 lb 0.1 oz)    Exam:   General:  no acute distress  HEENT:  most oral mucosa,   Respiratory: clear to  auscultation bilaterally  CVS: S1 and S2 is irregularly irregular, no murmurs   GI: Soft, nondistended, nontender, bowel sounds present  Musculoskeletal: Warm, no edema  CNS: Alert and oriented 1, confused, nonfocal  Data Reviewed: Basic Metabolic Panel:  Recent Labs Lab 12/10/14 1616 12/10/14 2100 12/11/14 0250 12/12/14 0804 12/13/14 0429 12/14/14 0645  NA 130* 134* 133* 127* 132* 130*  K 3.8 4.2 4.2 3.7 3.8 3.5  CL 96* 97* 97* 90* 93* 92*  CO2 23 23 24 23 24 22   GLUCOSE 221* 196* 223* 206* 218* 107*  BUN 41* 26* 29* 40* 24* 36*  CREATININE 6.62* 4.64* 5.02* 6.37* 4.22* 5.87*  CALCIUM 7.7* 7.6* 7.7* 7.9* 7.9* 7.7*  PHOS 3.1 2.6  --   --   --  4.2   Liver Function Tests:  Recent Labs Lab 12/09/14 2030 12/10/14 0440 12/10/14 1616 12/10/14 2100 12/14/14 0645  AST 70* 62*  --   --   --   ALT 55 55  --   --   --   ALKPHOS 280* 274*  --   --   --   BILITOT 1.3* 1.9*  --   --   --   PROT 5.7* 6.2*  --   --   --   ALBUMIN 1.9* 1.9* 1.7* 1.7* 1.6*    Recent Labs Lab 12/10/14 0440  LIPASE 38   No results for input(s): AMMONIA in the last 168 hours. CBC:  Recent Labs Lab 12/09/14 2030  12/10/14 1616 12/10/14 2100 12/11/14 0250 12/12/14 0804 12/13/14 0429 12/14/14 0459  WBC 14.8*  < > 15.2* 15.6* 15.9* 11.7* 13.0* 17.8*  NEUTROABS 12.7*  --  13.0*  --   --   --   --   --   HGB 8.6*  < > 8.4* 9.2* 8.7* 8.2* 8.3* 7.9*  HCT 26.4*  < > 26.1* 28.6* 27.2* 24.7* 25.9* 23.9*  MCV 96.4  < > 94.9 96.3 96.1 92.5 93.5 92.3  PLT 157  < > 168 182 189 231 260 296  < > = values in this interval not displayed. Cardiac Enzymes:  Recent Labs Lab 12/10/14 0440  TROPONINI 0.10*   BNP (last 3 results)  Recent Labs  11/05/14 2205  BNP 3626.3*    ProBNP (last 3 results)  Recent Labs  02/20/14 1515 02/24/14 1145 11/15/14 1622  PROBNP 15468.0* 10410.0* >4940.0*    CBG:  Recent Labs Lab 12/12/14 2139 12/13/14 0823 12/13/14 1135 12/13/14 1537  12/14/14 0739  GLUCAP 240* 240* 245* 265* 111*    Recent Results (from the past 240 hour(s))  Blood Culture (routine x 2)     Status: None (Preliminary result)   Collection Time: 12/09/14  8:20 PM  Result Value Ref Range Status   Specimen Description BLOOD RIGHT WRIST  Final   Special Requests BOTTLES DRAWN AEROBIC AND ANAEROBIC 5CC  Final   Culture NO GROWTH 4 DAYS  Final   Report Status PENDING  Incomplete  Blood Culture (routine x 2)     Status: None (Preliminary result)   Collection Time: 12/09/14  8:25 PM  Result Value Ref Range Status   Specimen Description BLOOD RIGHT HAND  Final   Special Requests BOTTLES DRAWN AEROBIC AND ANAEROBIC 4CC  Final   Culture NO GROWTH 4 DAYS  Final   Report Status PENDING  Incomplete  C difficile quick scan w PCR reflex     Status: Abnormal  Collection Time: 12/10/14  4:42 AM  Result Value Ref Range Status   C Diff antigen POSITIVE (A) NEGATIVE Final   C Diff toxin NEGATIVE NEGATIVE Final   C Diff interpretation   Final    C. difficile present, but toxin not detected. This indicates colonization. In most cases, this does not require treatment. If patient has signs and symptoms consistent with colitis, consider treatment.     Studies: US Scrotum  12/12/2014   CLINICAL DATA:  End-stage renal disease. Pain. Acute encephalopathy and confusion.  EXAM: ULTRASOUND OF SCROTUM  TECHNIQUE: Complete ultrasound examination of the testicles, epididymis, and other scrotal structures was performed.  COMPARISON:  CT abdomen and pelvis 12/10/2014  FINDINGS: Right testicle  Measurements: 3.1 x 1.6 x 2.4 cm. No mass or microlithiasis visualized.  Left testicle  Measurements: 1.9 x 1.8 x 2.3 cm. Focal intra testicular calcification measuring 1.2 mm, likely benign. No mass identified.  Right epididymis:  Normal in size and appearance.  Left epididymis:  Normal in size and appearance.  Hydrocele:  None visualized.  Varicocele:  Borderline left scrotal varicoceles  demonstrated.  Color flow Doppler images demonstrate normal homogeneous flow to both testes.  IMPRESSION: Focal testicular calcification on the left. No mass or parenchymal distortion in either testis. No evidence of epididymal orchitis. Borderline left scrotal varicoceles.   Electronically Signed   By: Lucienne Capers M.D.   On: 12/12/2014 20:27    Scheduled Meds: . amiodarone  400 mg Oral Daily  . atorvastatin  40 mg Oral QHS  . calcitRIOL  1.75 mcg Oral Q M,W,F-HD  . colchicine  0.6 mg Oral Daily  . darbepoetin (ARANESP) injection - DIALYSIS  100 mcg Intravenous Q Mon-HD  . feeding supplement (NEPRO CARB STEADY)  237 mL Oral BID BM  . fluocinonide ointment   Topical QID  . insulin aspart  0-9 Units Subcutaneous TID WC  . metoprolol tartrate  75 mg Oral BID  . multivitamin  1 tablet Oral QHS  . phenytoin  300 mg Oral q morning - 10a  . piperacillin-tazobactam (ZOSYN)  IV  2.25 g Intravenous Q8H  . saccharomyces boulardii  250 mg Oral BID  . sodium chloride  3 mL Intravenous Q12H  . vancomycin  1,000 mg Intravenous Q M,W,F-HD  . Warfarin - Pharmacist Dosing Inpatient   Does not apply q1800   Continuous Infusions: . diltiazem (CARDIZEM) infusion 15 mg/hr (12/14/14 0856)      Time spent: 25 minutes    Peggie Hornak, [place]  [facility] Pager [phone]. If 7PM-7AM, please contact night-coverage at [email or web address], password [facility] 12/14/2014, 1:02 PM  LOS: 5 days

## 2014-12-14 NOTE — Progress Notes (Signed)
Noted medical progress and plans. I will follow up on Monday to assist in determining rehab needs once medically ready. Noted may plan for plasmaphersis 8/29. [phone]

## 2014-12-14 NOTE — Progress Notes (Signed)
LTM EEG D/C'd per Dr Kirkpatrick 

## 2014-12-14 NOTE — Progress Notes (Signed)
ANTICOAGULATION CONSULT NOTE - Follow Up Consult  Pharmacy Consult for Coumadin Indication: atrial fibrillation  Allergies  Allergen Reactions  . Keflex [Cephalexin] Other (See Comments)    c-diff reaction  . Dilaudid [Hydromorphone] Nausea And Vomiting    Patient Measurements: Height: 6' (182.9 cm) Weight: 187 lb 6.3 oz (85 kg) IBW/kg (Calculated) : 77.6 Heparin Dosing Weight:   Vital Signs: Temp: 98.1 F (36.7 C) (08/26 1455) Temp Source: Oral (08/26 1455) BP: 100/71 mmHg (08/26 1455) Pulse Rate: 111 (08/26 1455)  Labs:  Recent Labs  12/12/14 0804 12/13/14 0429 12/14/14 0459 12/14/14 0645  HGB 8.2* 8.3* 7.9*  --   HCT 24.7* 25.9* 23.9*  --   PLT 231 260 296  --   LABPROT 19.2* 20.7* 31.7*  --   INR 1.61* 1.78* 3.14*  --   CREATININE 6.37* 4.22*  --  5.87*    Estimated Creatinine Clearance: 14.9 mL/min (by C-G formula based on Cr of 5.87).   Medications:  Scheduled:  . amiodarone  400 mg Oral Daily  . atorvastatin  40 mg Oral QHS  . calcitRIOL  1.75 mcg Oral Q M,W,F-HD  . colchicine  0.6 mg Oral Daily  . darbepoetin (ARANESP) injection - DIALYSIS  100 mcg Intravenous Q Mon-HD  . feeding supplement (NEPRO CARB STEADY)  237 mL Oral BID BM  . fluocinonide ointment   Topical QID  . insulin aspart  0-9 Units Subcutaneous TID WC  . metoprolol tartrate  75 mg Oral BID  . multivitamin  1 tablet Oral QHS  . phenytoin  300 mg Oral q morning - 10a  . piperacillin-tazobactam (ZOSYN)  IV  2.25 g Intravenous Q8H  . saccharomyces boulardii  250 mg Oral BID  . sodium chloride  3 mL Intravenous Q12H  . vancomycin  1,000 mg Intravenous Q M,W,F-HD  . Warfarin - Pharmacist Dosing Inpatient   Does not apply q1800    Assessment: 59yo male with AFib, large jump in INR today to 3.14.  Hg 7.9, ~stable and pltc wnl.  No bleeding noted.  Pt on Amiodarone and Phenytoin, which may increase Coumadin effect.    Goal of Therapy:  INR 2-3 Monitor platelets by anticoagulation  protocol: Yes   Plan:  No Coumadin today F/U in AM  Gracy Bruins, PharmD [facility]

## 2014-12-14 NOTE — Progress Notes (Signed)
Physical Therapy Treatment Patient Details Name: JEIDEN DAUGHTRIDGE MRN: [id number] DOB: [birth year] Today's Date: 12/14/2014    History of Present Illness 59 yo male readmission with fever cough HCAP, anemia, and MRI abnormal t2-flair involving temporal lobes/ hippocami bilaterally with question of trace diffusion abnormally within R hippocampus. MRI reveals remote L PCA infarct. Pt with recent d/c 11/30/14 PMH: CDIFF, limbic encephalitis, ESRD, DM, CHF, CKD, PNA, HTN, cardioversion, AV fistula L forearm, R heart catheterization    PT Comments    Progressing steadily with ability to scoot, stand up, transfer and with gait stability.  Heart rate and rhythm limit pt's ability to push himself.  Follow Up Recommendations  CIR     Equipment Recommendations  None recommended by PT    Recommendations for Other Services       Precautions / Restrictions Precautions Precautions: Fall Precaution Comments: oxygen needs, multiple lines/ leads    Mobility  Bed Mobility               General bed mobility comments: up in the chair  Transfers Overall transfer level: Needs assistance Equipment used: Rolling walker (2 wheeled) Transfers: Sit to/from [facility] Sit to Stand: Min assist (with /without elevated surface) Stand pivot transfers: Min assist       General transfer comment: heavy use of UE's; assisted most to come forward.  Ambulation/Gait Ambulation/Gait assistance: Min assist;Mod assist;+2 safety/equipment Ambulation Distance (Feet): 80 Feet (x2 with rest between and to moderate High HR) Assistive device: Rolling walker (2 wheeled) Gait Pattern/deviations: Step-through pattern;Trunk flexed Gait velocity: decreased   General Gait Details: mildly guarded gait with flexed posture, but generally steady.  EHR with afib and PVC's and sustained 130's to 150 bpm   Stairs            Wheelchair Mobility    Modified Rankin (Stroke Patients Only)        Balance Overall balance assessment: Needs assistance Sitting-balance support: No upper extremity supported Sitting balance-Leahy Scale: Good       Standing balance-Leahy Scale: Poor Standing balance comment: improving toward fair with little need to support with UE statically.                    Cognition Arousal/Alertness: Awake/alert Behavior During Therapy: WFL for tasks assessed/performed Overall Cognitive Status: Within Functional Limits for tasks assessed         Following Commands: Follows one step commands with increased time            Exercises      General Comments        Pertinent Vitals/Pain Pain Assessment: Faces Faces Pain Scale: No hurt    Home Living                      Prior Function            PT Goals (current goals can now be found in the care plan section) Acute Rehab PT Goals Patient Stated Goal: stronger then home PT Goal Formulation: With patient Time For Goal Achievement: 12/25/14 Potential to Achieve Goals: Good Progress towards PT goals: Progressing toward goals    Frequency  Min 3X/week    PT Plan Current plan remains appropriate;Frequency needs to be updated    Co-evaluation             End of Session   Activity Tolerance: Patient tolerated treatment well;Patient limited by fatigue;Other (comment) (limited by heart rithyms) Patient left: in  chair;with call bell/phone within reach     Time: 1530-1552 PT Time Calculation (min) (ACUTE ONLY): 22 min  Charges:  $Gait Training: 8-22 mins                    G Codes:      Dontez Hauss, Tessie Fass 12/14/2014, 5:08 PM 12/14/2014  Donnella Sham, PT [phone]  (pager)

## 2014-12-14 NOTE — Progress Notes (Signed)
OT Cancellation Note  Patient Details Name: Joseph [name] MRN: [id number] DOB: [birth year]   Cancelled Treatment:    Reason Eval/Treat Not Completed: Patient at procedure or test/ unavailable - Pt in HD [place], OTR/L [phone]   Lucille Passy M 12/14/2014, 1:56 PM

## 2014-12-14 NOTE — Progress Notes (Signed)
Patient Name: Joseph [name] Date of Encounter: 12/14/2014  Principal Problem:   Sepsis Active Problems:   Atrial fibrillation-permanent   Essential hypertension   Gout   HCAP (healthcare-associated pneumonia)   ESRD on hemodialysis   Nausea vomiting and diarrhea   Pneumonia   Atrial fibrillation with RVR   Encephalopathy acute   Debility   Length of Stay: 5  SUBJECTIVE  Currently on hemodialysis.  CURRENT MEDS . amiodarone  400 mg Oral Daily  . atorvastatin  40 mg Oral QHS  . calcitRIOL  1.75 mcg Oral Q M,W,F-HD  . colchicine  0.6 mg Oral Daily  . darbepoetin (ARANESP) injection - DIALYSIS  100 mcg Intravenous Q Mon-HD  . feeding supplement (NEPRO CARB STEADY)  237 mL Oral BID BM  . fluocinonide ointment   Topical QID  . insulin aspart  0-9 Units Subcutaneous TID WC  . metoprolol tartrate  75 mg Oral BID  . multivitamin  1 tablet Oral QHS  . phenytoin  300 mg Oral q morning - 10a  . piperacillin-tazobactam (ZOSYN)  IV  2.25 g Intravenous Q8H  . saccharomyces boulardii  250 mg Oral BID  . sodium chloride  3 mL Intravenous Q12H  . vancomycin  1,000 mg Intravenous Q M,W,F-HD  . Warfarin - Pharmacist Dosing Inpatient   Does not apply q1800    OBJECTIVE   Intake/Output Summary (Last 24 hours) at 12/14/14 0940 Last data filed at 12/13/14 1500  Gross per 24 hour  Intake  851.9 ml  Output      0 ml  Net  851.9 ml   Filed Weights   12/11/14 2305 12/12/14 1202 12/14/14 0905  Weight: 177 lb 7.5 oz (80.5 kg) 188 lb 4.4 oz (85.4 kg) 194 lb 0.1 oz (88 kg)    PHYSICAL EXAM Filed Vitals:   12/14/14 0800 12/14/14 0905 12/14/14 0913 12/14/14 0930  BP: 109/70 119/76 110/72 114/65  Pulse: 110 110 110 116  Temp: 99 F (37.2 C) 98.8 F (37.1 C)    TempSrc: Oral Oral    Resp: 27 20    Height:      Weight:  194 lb 0.1 oz (88 kg)    SpO2: 93%       LABS  CBC  Recent Labs  12/13/14 0429 12/14/14 0459  WBC 13.0* 17.8*  HGB 8.3* 7.9*  HCT 25.9* 23.9*  MCV 93.5  92.3  PLT 260 782   Basic Metabolic Panel  Recent Labs  12/13/14 0429 12/14/14 0645  NA 132* 130*  K 3.8 3.5  CL 93* 92*  CO2 24 22  GLUCOSE 218* 107*  BUN 24* 36*  CREATININE 4.22* 5.87*  CALCIUM 7.9* 7.7*  PHOS  --  4.2   Liver Function Tests  Recent Labs  12/14/14 0645  ALBUMIN 1.6*    Radiology Studies Imaging results have been reviewed and US Scrotum  12/12/2014   CLINICAL DATA:  End-stage renal disease. Pain. Acute encephalopathy and confusion.  EXAM: ULTRASOUND OF SCROTUM  TECHNIQUE: Complete ultrasound examination of the testicles, epididymis, and other scrotal structures was performed.  COMPARISON:  CT abdomen and pelvis 12/10/2014  FINDINGS: Right testicle  Measurements: 3.1 x 1.6 x 2.4 cm. No mass or microlithiasis visualized.  Left testicle  Measurements: 1.9 x 1.8 x 2.3 cm. Focal intra testicular calcification measuring 1.2 mm, likely benign. No mass identified.  Right epididymis:  Normal in size and appearance.  Left epididymis:  Normal in size and appearance.  Hydrocele:  None  visualized.  Varicocele:  Borderline left scrotal varicoceles demonstrated.  Color flow Doppler images demonstrate normal homogeneous flow to both testes.  IMPRESSION: Focal testicular calcification on the left. No mass or parenchymal distortion in either testis. No evidence of epididymal orchitis. Borderline left scrotal varicoceles.   Electronically Signed   By: Lucienne Capers M.D.   On: 12/12/2014 20:27    ASSESSMENT AND PLAN  Remains mildly tachycardic on maximum usual IV diltiazem dose. No BP problems with added beta blocker. Will increase metoprolol to 75 mg BID and try to wean off IV diltiazem.   Sanda Klein, MD, [facility] [phone] office [phone] pager 12/14/2014 9:40 AM

## 2014-12-14 NOTE — Progress Notes (Signed)
Subjective: no complaints, still somewhat confused  Objective Vital signs in last 24 hours: Filed Vitals:   12/14/14 0905 12/14/14 0913 12/14/14 0930 12/14/14 1000  BP: 119/76 110/72 114/65 102/77  Pulse: 110 110 116 113  Temp: 98.8 F (37.1 C)     TempSrc: Oral     Resp: 20     Height:      Weight: 88 kg (194 lb 0.1 oz)     SpO2:       Weight change:   Physical Exam: General: alert and no distress,pleasanly confused    Lungs=Chest clear bilat Card =Ireeg, irreg  VR stable in 90s  no MRG  ABD= Abd soft, no ascites or hsm  Extrem =No LE or UE edema  Dialysis Acess=L arm AVG +bruit    OP HD= [facility] 4 hrs 80kgs 3K/2.5Ca No Heparin Profile 2 Micera 100 q 2 weeks- to start 8/24 Calcitriol 1.75  Problem/Plan: 1. ESRD HD  On scheule MWF /am k 3.8 2. Fever/ HCAP - on abx, afeb  3. 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 Afib on iv dilt/ MTP 5. Acute encephalopathy - per neuro 6. Volume/ HTN - doubt bed wt's , mod vol excess  7. 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 , cont vit D on hd , no binders yet  Plan - volume removal/ hypotension have been an issue, might need to consider amiodarone for afib to help w rate control   Kelly Splinter MD pager [phone]    cell [phone] 12/14/2014, 10:51 AM    Labs: Basic Metabolic Panel:  Recent Labs Lab 12/10/14 1616 12/10/14 2100  12/12/14 0804 12/13/14 0429 12/14/14 0645  NA 130* 134*  < > 127* 132* 130*  K 3.8 4.2  < > 3.7 3.8 3.5  CL 96* 97*  < > 90* 93* 92*  CO2 23 23  < > 23 24 22   GLUCOSE 221* 196*  < > 206* 218* 107*  BUN 41* 26*  < > 40* 24* 36*  CREATININE 6.62* 4.64*  < > 6.37* 4.22* 5.87*  CALCIUM 7.7* 7.6*  < > 7.9* 7.9* 7.7*  PHOS 3.1 2.6  --   --   --  4.2  < > = values in this interval not displayed. Liver Function Tests:  Recent Labs Lab 12/09/14 2030 12/10/14 0440 12/10/14 1616 12/10/14 2100 12/14/14 0645  AST 70* 62*  --   --   --   ALT 55 55  --   --   --   ALKPHOS 280* 274*  --    --   --   BILITOT 1.3* 1.9*  --   --   --   PROT 5.7* 6.2*  --   --   --   ALBUMIN 1.9* 1.9* 1.7* 1.7* 1.6*    Recent Labs Lab 12/10/14 0440  LIPASE 38   No results for input(s): AMMONIA in the last 168 hours. CBC:  Recent Labs Lab 12/09/14 2030  12/10/14 1616 12/10/14 2100 12/11/14 0250 12/12/14 0804 12/13/14 0429 12/14/14 0459  WBC 14.8*  < > 15.2* 15.6* 15.9* 11.7* 13.0* 17.8*  NEUTROABS 12.7*  --  13.0*  --   --   --   --   --   HGB 8.6*  < > 8.4* 9.2* 8.7* 8.2* 8.3* 7.9*  HCT 26.4*  < > 26.1* 28.6* 27.2* 24.7* 25.9* 23.9*  MCV 96.4  < > 94.9 96.3 96.1 92.5 93.5 92.3  PLT  157  < > 168 182 189 231 260 296  < > = values in this interval not displayed. Cardiac Enzymes:  Recent Labs Lab 12/10/14 0440  TROPONINI 0.10*   CBG:  Recent Labs Lab 12/12/14 2139 12/13/14 0823 12/13/14 1135 12/13/14 1537 12/14/14 0739  GLUCAP 240* 240* 245* 265* 111*    Studies/Results: US Scrotum  12/12/2014   CLINICAL DATA:  End-stage renal disease. Pain. Acute encephalopathy and confusion.  EXAM: ULTRASOUND OF SCROTUM  TECHNIQUE: Complete ultrasound examination of the testicles, epididymis, and other scrotal structures was performed.  COMPARISON:  CT abdomen and pelvis 12/10/2014  FINDINGS: Right testicle  Measurements: 3.1 x 1.6 x 2.4 cm. No mass or microlithiasis visualized.  Left testicle  Measurements: 1.9 x 1.8 x 2.3 cm. Focal intra testicular calcification measuring 1.2 mm, likely benign. No mass identified.  Right epididymis:  Normal in size and appearance.  Left epididymis:  Normal in size and appearance.  Hydrocele:  None visualized.  Varicocele:  Borderline left scrotal varicoceles demonstrated.  Color flow Doppler images demonstrate normal homogeneous flow to both testes.  IMPRESSION: Focal testicular calcification on the left. No mass or parenchymal distortion in either testis. No evidence of epididymal orchitis. Borderline left scrotal varicoceles.   Electronically Signed    By: Lucienne Capers M.D.   On: 12/12/2014 20:27   Medications: . diltiazem (CARDIZEM) infusion 15 mg/hr (12/14/14 0856)   . amiodarone  400 mg Oral Daily  . atorvastatin  40 mg Oral QHS  . calcitRIOL  1.75 mcg Oral Q M,W,F-HD  . colchicine  0.6 mg Oral Daily  . darbepoetin (ARANESP) injection - DIALYSIS  100 mcg Intravenous Q Mon-HD  . feeding supplement (NEPRO CARB STEADY)  237 mL Oral BID BM  . fluocinonide ointment   Topical QID  . insulin aspart  0-9 Units Subcutaneous TID WC  . metoprolol tartrate  75 mg Oral BID  . multivitamin  1 tablet Oral QHS  . phenytoin  300 mg Oral q morning - 10a  . piperacillin-tazobactam (ZOSYN)  IV  2.25 g Intravenous Q8H  . saccharomyces boulardii  250 mg Oral BID  . sodium chloride  3 mL Intravenous Q12H  . vancomycin  1,000 mg Intravenous Q M,W,F-HD  . Warfarin - Pharmacist Dosing Inpatient   Does not apply [phone]

## 2014-12-15 LAB — RENAL FUNCTION PANEL
ALBUMIN: 1.7 g/dL — AB (ref 3.5–5.0)
ANION GAP: 11 (ref 5–15)
BUN: 30 mg/dL — AB (ref 6–20)
CHLORIDE: 94 mmol/L — AB (ref 101–111)
CO2: 24 mmol/L (ref 22–32)
Calcium: 7.9 mg/dL — ABNORMAL LOW (ref 8.9–10.3)
Creatinine, Ser: 4.47 mg/dL — ABNORMAL HIGH (ref 0.61–1.24)
GFR, EST AFRICAN AMERICAN: 15 mL/min — AB (ref >60)
GFR, EST NON AFRICAN AMERICAN: 13 mL/min — AB (ref >60)
Glucose, Bld: 292 mg/dL — ABNORMAL HIGH (ref 65–99)
PHOSPHORUS: 3.3 mg/dL (ref 2.5–4.6)
POTASSIUM: 3.9 mmol/L (ref 3.5–5.1)
Sodium: 129 mmol/L — ABNORMAL LOW (ref 135–145)

## 2014-12-15 LAB — GLUCOSE, CAPILLARY
GLUCOSE-CAPILLARY: 240 mg/dL — AB (ref 65–99)
GLUCOSE-CAPILLARY: 293 mg/dL — AB (ref 65–99)
Glucose-Capillary: 247 mg/dL — ABNORMAL HIGH (ref 65–99)
Glucose-Capillary: 210 mg/dL — ABNORMAL HIGH (ref 65–99)

## 2014-12-15 LAB — MAGNESIUM: Magnesium: 2.2 mg/dL (ref 1.7–2.4)

## 2014-12-15 LAB — PROTIME-INR
INR: 3.94 — AB (ref 0.00–1.49)
PROTHROMBIN TIME: 37.6 s — AB (ref 11.6–15.2)

## 2014-12-15 MED ORDER — HYDROCOD POLST-CPM POLST ER 10-8 MG/5ML PO SUER
5.0000 mL | Freq: Once | ORAL | Status: AC
  Administered 2014-12-15: 5 mL via ORAL
  Filled 2014-12-15: qty 5

## 2014-12-15 MED ORDER — INSULIN ASPART 100 UNIT/ML ~~LOC~~ SOLN
0.0000 [IU] | Freq: Every day | SUBCUTANEOUS | Status: DC
  Administered 2014-12-15 – 2014-12-17 (×3): 2 [IU] via SUBCUTANEOUS
  Administered 2014-12-18: 3 [IU] via SUBCUTANEOUS
  Administered 2014-12-19: 4 [IU] via SUBCUTANEOUS
  Administered 2014-12-20 – 2014-12-23 (×2): 2 [IU] via SUBCUTANEOUS
  Administered 2014-12-25: 3 [IU] via SUBCUTANEOUS
  Filled 2014-12-15 (×2): qty 1

## 2014-12-15 MED ORDER — METOPROLOL TARTRATE 100 MG PO TABS: 100.0000 mg | ORAL_TABLET | Freq: Two times a day (BID) | ORAL | Status: DC

## 2014-12-15 MED ORDER — DILTIAZEM HCL 30 MG PO TABS
30.0000 mg | ORAL_TABLET | Freq: Four times a day (QID) | ORAL | Status: DC
  Administered 2014-12-15 – 2014-12-26 (×42): 30 mg via ORAL
  Filled 2014-12-15 (×46): qty 1

## 2014-12-15 MED ORDER — GUAIFENESIN ER 600 MG PO TB12
600.0000 mg | ORAL_TABLET | Freq: Two times a day (BID) | ORAL | Status: DC
  Administered 2014-12-15 – 2014-12-26 (×20): 600 mg via ORAL
  Filled 2014-12-15 (×20): qty 1

## 2014-12-15 MED ORDER — INSULIN ASPART 100 UNIT/ML ~~LOC~~ SOLN
0.0000 [IU] | Freq: Three times a day (TID) | SUBCUTANEOUS | Status: DC
  Administered 2014-12-15: 5 [IU] via SUBCUTANEOUS
  Administered 2014-12-15: 8 [IU] via SUBCUTANEOUS
  Administered 2014-12-16: 3 [IU] via SUBCUTANEOUS
  Administered 2014-12-16: 2 [IU] via SUBCUTANEOUS
  Administered 2014-12-16: 8 [IU] via SUBCUTANEOUS
  Administered 2014-12-18: 5 [IU] via SUBCUTANEOUS
  Administered 2014-12-19: 3 [IU] via SUBCUTANEOUS
  Administered 2014-12-19: 5 [IU] via SUBCUTANEOUS
  Administered 2014-12-20: 2 [IU] via SUBCUTANEOUS
  Administered 2014-12-20 (×2): 3 [IU] via SUBCUTANEOUS
  Administered 2014-12-22 (×2): 2 [IU] via SUBCUTANEOUS
  Administered 2014-12-23: 5 [IU] via SUBCUTANEOUS
  Administered 2014-12-23: 3 [IU] via SUBCUTANEOUS
  Administered 2014-12-23: 2 [IU] via SUBCUTANEOUS
  Administered 2014-12-24 – 2014-12-25 (×2): 3 [IU] via SUBCUTANEOUS
  Administered 2014-12-25: 5 [IU] via SUBCUTANEOUS
  Administered 2014-12-26: 1 [IU] via SUBCUTANEOUS
  Administered 2014-12-26: 5 [IU] via SUBCUTANEOUS

## 2014-12-15 MED ORDER — LEVOFLOXACIN 500 MG PO TABS: 500.0000 mg | ORAL_TABLET | ORAL | Status: DC

## 2014-12-15 MED ORDER — INSULIN GLARGINE 100 UNIT/ML ~~LOC~~ SOLN
8.0000 [IU] | Freq: Every day | SUBCUTANEOUS | Status: DC
  Administered 2014-12-15 – 2014-12-25 (×11): 8 [IU] via SUBCUTANEOUS
  Filled 2014-12-15 (×12): qty 0.08

## 2014-12-15 MED ORDER — METOPROLOL TARTRATE 50 MG PO TABS
75.0000 mg | ORAL_TABLET | Freq: Two times a day (BID) | ORAL | Status: DC
  Administered 2014-12-15 – 2014-12-16 (×2): 75 mg via ORAL
  Administered 2014-12-16: 50 mg via ORAL
  Administered 2014-12-17 – 2014-12-26 (×19): 75 mg via ORAL
  Filled 2014-12-15 (×42): qty 1

## 2014-12-15 MED ORDER — LEVOFLOXACIN 750 MG PO TABS
750.0000 mg | ORAL_TABLET | Freq: Once | ORAL | Status: AC
  Administered 2014-12-15: 750 mg via ORAL
  Filled 2014-12-15: qty 1

## 2014-12-15 NOTE — Progress Notes (Signed)
Subjective:  Says he feels good today.  No shortness of breath or chest pain.  Objective:  Vital Signs in the last 24 hours: BP 102/63 mmHg  Pulse 79  Temp(Src) 98.1 F (36.7 C) (Oral)  Resp 24  Ht 6' (1.829 m)  Wt 85 kg (187 lb 6.3 oz)  BMI 25.41 kg/m2  SpO2 99%  Physical Exam: Pleasant black male sitting up at side of the bed in no acute distress Lungs:  Clear  Cardiac:  Irregular rhythm, normal S1 and S2, no S3 Extremities:  No edema present  Intake/Output from previous day: 08/26 0701 - 08/27 0700 In: 1860 [P.O.:1200; I.V.:360; IV Piggyback:300] Out: 3000  Weight Filed Weights   12/12/14 1202 12/14/14 0905 12/14/14 1243  Weight: 85.4 kg (188 lb 4.4 oz) 88 kg (194 lb 0.1 oz) 85 kg (187 lb 6.3 oz)    Lab Results: Basic Metabolic Panel:  Recent Labs  12/14/14 0645 12/15/14 0316  NA 130* 129*  K 3.5 3.9  CL 92* 94*  CO2 22 24  GLUCOSE 107* 292*  BUN 36* 30*  CREATININE 5.87* 4.47*    CBC:  Recent Labs  12/13/14 0429 12/14/14 0459  WBC 13.0* 17.8*  HGB 8.3* 7.9*  HCT 25.9* 23.9*  MCV 93.5 92.3  PLT 260 296    BNP    Component Value Date/Time   BNP 3626.3* 11/05/2014 2205    PROTIME: Lab Results  Component Value Date   INR 3.94* 12/15/2014   INR 3.14* 12/14/2014   INR 1.78* 12/13/2014    Telemetry: Atrial fibrillation with controlled response  Assessment/Plan:  1.  Chronic atrial fibrillation rate currently controlled with increase in medications and resolution of sepsis 2.  End-stage renal disease on dialysis 3.  Hypertension 4.  Chronic anticoagulation  Recommendations:  May change to oral therapy.  Discontinue intravenous diltiazem.      Kerry Hough  MD [facility]  12/15/2014, 11:41 AM

## 2014-12-15 NOTE — Progress Notes (Addendum)
ANTICOAGULATION/ANTIBIOTIC CONSULT NOTE - Follow Up Consult  Pharmacy Consult for Warfarin and Vanc/Zosyn Indication: atrial fibrillation and sepsis  Allergies  Allergen Reactions  . Keflex [Cephalexin] Other (See Comments)    c-diff reaction  . Dilaudid [Hydromorphone] Nausea And Vomiting    Patient Measurements: Height: 6' (182.9 cm) Weight: 187 lb 6.3 oz (85 kg) IBW/kg (Calculated) : 77.6  Vital Signs: Temp: 98 F (36.7 C) (08/27 0335) Temp Source: Oral (08/27 0335) BP: 93/56 mmHg (08/27 0335) Pulse Rate: 72 (08/27 0335)  Labs:  Recent Labs  12/12/14 0804 12/13/14 0429 12/14/14 0459 12/14/14 0645 12/15/14 0316  HGB 8.2* 8.3* 7.9*  --   --   HCT 24.7* 25.9* 23.9*  --   --   PLT 231 260 296  --   --   LABPROT 19.2* 20.7* 31.7*  --  37.6*  INR 1.61* 1.78* 3.14*  --  3.94*  CREATININE 6.37* 4.22*  --  5.87* 4.47*    Estimated Creatinine Clearance: 19.5 mL/min (by C-G formula based on Cr of 4.47).   Assessment:  59yo male on Coumadin for AFib. Jump in INR yesterday to 3.14, now INR is 3.94. Hgb 7.9, plts 296 and stable. Pt on amio po and phenytoin, likely contributing to elevated INR. Pt on empiric Vanc/Zosyn for rule out sepsis with AMS on admission. ESRD w/ HD on MWF. Afeb, WBC 17.8. Cultures show NGTD.   Goal of Therapy:  INR 2-3 Monitor platelets by anticoagulation protocol: Yes   Plan:  Hold warfarin today Daily INR; Monitor for S&S of bleed Continue Vanc/Zosyn Pre-HD vanc level prior to dialysis 08/29  Angela Burke, PharmD Pharmacy Resident Pager: [phone] 12/15/2014,7:39 AM  _________________________________________________  Addendum:  Cephas Darby discontinued per MD. Pharmacy consulted to start levofloxacin. Levofloxacin 750mg  x 1 then 500 mg q48h (HD dosing)  Angela Burke, PharmD Pharmacy Resident Pager: [phone]

## 2014-12-15 NOTE — Progress Notes (Signed)
UR COMPLETED  

## 2014-12-15 NOTE — Progress Notes (Signed)
Called and updated patient's wife on POC. Notified that patient will be moving to 5W30

## 2014-12-15 NOTE — Progress Notes (Signed)
Report called to [place], RN on 5W. Patient to be transferred to 5W30 via bed on tele and oxygen. Belongings sent with patient.

## 2014-12-15 NOTE — Progress Notes (Signed)
CRITICAL VALUE ALERT  Critical value received:  CBG 418  Date of notification:  8/26  Time of notification:  2200  Critical value read back: yes  Nurse who received alert:  Orry Sigl A   MD notified (1st page):  [facility]  Time of first page:  2230  MD notified (2nd page):  Time of second page:  Responding MD:  [facility]  Time MD responded:  [id number]  Insulin ordered and given.  Will monitor closely.

## 2014-12-15 NOTE — Progress Notes (Signed)
Subjective: stable, up in chair  Objective Vital signs in last 24 hours: Filed Vitals:   12/15/14 0335 12/15/14 0700 12/15/14 0825 12/15/14 1216  BP: 93/56  102/63   Pulse: 72  79   Temp: 98 F (36.7 C) 98.1 F (36.7 C)  98.3 F (36.8 C)  TempSrc: Oral Oral  Oral  Resp: 22  24   Height:      Weight:      SpO2: 100%  99%    Weight change:   Physical Exam: General: alert and no distress,pleasanly confused    Lungs=Chest clear bilat Card =Ireeg, irreg  VR stable in 90s  no MRG  ABD= Abd soft, no ascites or hsm  Extrem =No LE or UE edema  Dialysis Acess=L arm AVG +bruit    [facility]= [facility] 4 hrs 80kgs 3K/2.5Ca No Heparin Profile 2 Micera 100 q 2 weeks- to start 8/24 Calcitriol 1.75  Assessment: 1 ESRD HD Monday 2 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 Neuro asked to get tunneled cath in for PLEX to start Monday. Will consult IR.   Kelly Splinter MD pager [phone]    cell [phone] 12/15/2014, 12:38 PM    Labs: Basic Metabolic Panel:  Recent Labs Lab 12/10/14 2100  12/13/14 0429 12/14/14 0645 12/15/14 0316  NA 134*  < > 132* 130* 129*  K 4.2  < > 3.8 3.5 3.9  CL 97*  < > 93* 92* 94*  CO2 23  < > 24 22 24   GLUCOSE 196*  < > 218* 107* 292*  BUN 26*  < > 24* 36* 30*  CREATININE 4.64*  < > 4.22* 5.87* 4.47*  CALCIUM 7.6*  < > 7.9* 7.7* 7.9*  PHOS 2.6  --   --  4.2 3.3  < > = values in this interval not displayed. Liver Function Tests:  Recent Labs Lab 12/09/14 2030 12/10/14 0440  12/10/14 2100 12/14/14 0645 12/15/14 0316  AST 70* 62*  --   --   --   --   ALT 55 55  --   --   --   --   ALKPHOS 280* 274*  --   --   --   --   BILITOT 1.3* 1.9*  --   --   --   --   PROT 5.7* 6.2*  --   --   --   --   ALBUMIN 1.9* 1.9*  < > 1.7* 1.6*  1.7*  < > = values in this interval not displayed.  Recent Labs Lab 12/10/14 0440  LIPASE 38   No results for input(s): AMMONIA in the last 168 hours. CBC:  Recent Labs Lab 12/09/14 2030  12/10/14 1616 12/10/14 2100 12/11/14 0250 12/12/14 0804 12/13/14 0429 12/14/14 0459  WBC 14.8*  < > 15.2* 15.6* 15.9* 11.7* 13.0* 17.8*  NEUTROABS 12.7*  --  13.0*  --   --   --   --   --   HGB 8.6*  < > 8.4* 9.2* 8.7* 8.2* 8.3* 7.9*  HCT 26.4*  < > 26.1* 28.6* 27.2* 24.7* 25.9* 23.9*  MCV 96.4  < > 94.9 96.3 96.1 92.5 93.5 92.3  PLT 157  < > 168 182 189 231 260 296  < > =  values in this interval not displayed. Cardiac Enzymes:  Recent Labs Lab 12/10/14 0440  TROPONINI 0.10*   CBG:  Recent Labs Lab 12/14/14 0739 12/14/14 1447 12/14/14 1610 12/14/14 2143 12/15/14 0820  GLUCAP 111* 130* 171* 418* 240*    Studies/Results: No results found. Medications: . diltiazem (CARDIZEM) infusion 10 mg/hr (12/15/14 1030)   . amiodarone  400 mg Oral Daily  . atorvastatin  40 mg Oral QHS  . calcitRIOL  1.75 mcg Oral Q M,W,F-HD  . colchicine  0.6 mg Oral Daily  . darbepoetin (ARANESP) injection - DIALYSIS  100 mcg Intravenous Q Mon-HD  . feeding supplement (NEPRO CARB STEADY)  237 mL Oral BID BM  . fluocinonide ointment   Topical QID  . guaiFENesin  600 mg Oral BID  . insulin aspart  0-15 Units Subcutaneous TID WC  . insulin aspart  0-5 Units Subcutaneous QHS  . metoprolol tartrate  75 mg Oral BID  . multivitamin  1 tablet Oral QHS  . phenytoin  300 mg Oral q morning - 10a  . piperacillin-tazobactam (ZOSYN)  IV  2.25 g Intravenous Q8H  . saccharomyces boulardii  250 mg Oral BID  . sodium chloride  3 mL Intravenous Q12H  . vancomycin  1,000 mg Intravenous Q M,W,F-HD  . Warfarin - Pharmacist Dosing Inpatient   Does not apply [phone]

## 2014-12-15 NOTE — Progress Notes (Signed)
[facility] PROGRESS NOTE  Joseph [name] GGE:[id number] DOB: 01/30/1956 DOA: 12/09/2014 PCP: Webb Silversmith, NP   Brief narrative 59 year old male with history of end-stage renal disease on dialysis (M, W, F), chronic A. fib on Coumadin, gout, type 2 diabetes mellitus with recent hospitalization for encephalopathy with MRI showing them became cephalitis. During that time he had undergone LP which was unremarkable. He did respond to IV steroid initially which was discontinued as patient had C. difficile colitis. Patient brought to the ED as he was having persistent fever for one day associated with productive cough with some hemoptysis. He also reported having nausea and vomiting with diarrhea. On presentation patient was mildly hypotensive and received 1 L IV normal saline bolus in the ED following which his heart rate and blood pressure improved. Patient appeared to have some confusion and lethargy on presentation. Patient criteria for sepsis and imaging showed left-sided pneumonia. Patient placed on empiric antibiotics and admitted to stepdown.  Assessment/Plan: Sepsis secondary to healthcare associated pneumonia On empiric vancomycin and Zosyn. Sister Levaquin to complete 7 day course.. Blood culture negative. Hemoptysis on presentation resolved. H&H stable. Pulmonary signed off..  Follow-up chest x-ray in 4 weeks to see for resolution. Repeat wbc in am.   Acute encephalopathy Associated with underlying sepsis versus ongoing encephalitis. Patient recently hospitalized for limbic encephalitis. MRI on admission showing persistent abnormal T2/FLAIR signal intensity involving mesial temporal lobes/hippocampi bilaterally which seems to have improved overall compared to MRI from 11/21/2014. -Patient remains confused off and on and occasionally hallucinating. Neurology consulted. EEG repeated and is unremarkable for acute seizures. Received IV Solu-Medrol for 5 days during recent hospitalization. LP  was negative. Ultrasound of the scrotum to rule out testicular malignancy was negative. (As per neurology to rule out paraneoplastic encephalitis). -Neurology suggest this could be post infectious versus autoimmune encephalitis. Recommends starting plasma exchange on 8/29.  A. fib with RVR -After returning from dialysis on 8/22.  Required Cardizem and amiodarone drip. Now on by mouth amiodarone and Cardizem drip discontinued today. Started On metoprolol twice a day. - continue  Coumadin. Echo from 08/2014 showed LVH with EF of 50-55%  Nausea vomiting with diarrhea Stool for C. difficile  suggestive of colonization. Discontinued oral vancomycin.  N/V and diarrhea resolved.    ESRD on hemodialysis Renal following. Hemodialysis as scheduled  History of seizure Continue Dilantin.  Diabetes mellitus fsg elevated last evening to 400s. In 200s this am. Distal motor/recent insulin. Will add low-dose Lantus at bedtime..   Protein calorie malnutrition Continue supplements  Diet: Renal/heart healthy  DVT prophylaxis: coumadin  Code Status: Full code Family Communication: none at bedside Disposition Plan: Transfer to telemetry   Consultants:  Renal  Pulmonary  Procedures:  MRI brain  Antibiotics:  IV vancomycin and Zosyn since 8/22-8/27  levaquin 8/27-8/28  HPI/Subjective: Patient seen and examined. Still quite confused. afebrile  Objective: Filed Vitals:   12/15/14 1350  BP: 101/65  Pulse: 88  Temp:   Resp: 23    Intake/Output Summary (Last 24 hours) at 12/15/14 1355 Last data filed at 12/15/14 0800  Gross per 24 hour  Intake   1585 ml  Output      0 ml  Net   1585 ml   Filed Weights   12/14/14 0905 12/14/14 1243 12/15/14 1247  Weight: 88 kg (194 lb 0.1 oz) 85 kg (187 lb 6.3 oz) 74.5 kg (164 lb 3.9 oz)    Exam:   General:  no acute distress  HEENT:  most oral mucosa   Respiratory: clear to auscultation bilaterally  CVS: S1 and S2  irregularly  irregular, no murmurs   GI: Soft, nondistended, nontender, bowel sounds present  Musculoskeletal: Warm, no edema  CNS: Alert and oriented 1, confused, nonfocal  Data Reviewed: Basic Metabolic Panel:  Recent Labs Lab 12/10/14 1616 12/10/14 2100 12/11/14 0250 12/12/14 0804 12/13/14 0429 12/14/14 0645 12/15/14 0316  NA 130* 134* 133* 127* 132* 130* 129*  K 3.8 4.2 4.2 3.7 3.8 3.5 3.9  CL 96* 97* 97* 90* 93* 92* 94*  CO2 23 23 24 23 24 22 24   GLUCOSE 221* 196* 223* 206* 218* 107* 292*  BUN 41* 26* 29* 40* 24* 36* 30*  CREATININE 6.62* 4.64* 5.02* 6.37* 4.22* 5.87* 4.47*  CALCIUM 7.7* 7.6* 7.7* 7.9* 7.9* 7.7* 7.9*  MG  --   --   --   --   --   --  2.2  PHOS 3.1 2.6  --   --   --  4.2 3.3   Liver Function Tests:  Recent Labs Lab 12/09/14 2030 12/10/14 0440 12/10/14 1616 12/10/14 2100 12/14/14 0645 12/15/14 0316  AST 70* 62*  --   --   --   --   ALT 55 55  --   --   --   --   ALKPHOS 280* 274*  --   --   --   --   BILITOT 1.3* 1.9*  --   --   --   --   PROT 5.7* 6.2*  --   --   --   --   ALBUMIN 1.9* 1.9* 1.7* 1.7* 1.6* 1.7*    Recent Labs Lab 12/10/14 0440  LIPASE 38   No results for input(s): AMMONIA in the last 168 hours. CBC:  Recent Labs Lab 12/09/14 2030  12/10/14 1616 12/10/14 2100 12/11/14 0250 12/12/14 0804 12/13/14 0429 12/14/14 0459  WBC 14.8*  < > 15.2* 15.6* 15.9* 11.7* 13.0* 17.8*  NEUTROABS 12.7*  --  13.0*  --   --   --   --   --   HGB 8.6*  < > 8.4* 9.2* 8.7* 8.2* 8.3* 7.9*  HCT 26.4*  < > 26.1* 28.6* 27.2* 24.7* 25.9* 23.9*  MCV 96.4  < > 94.9 96.3 96.1 92.5 93.5 92.3  PLT 157  < > 168 182 189 231 260 296  < > = values in this interval not displayed. Cardiac Enzymes:  Recent Labs Lab 12/10/14 0440  TROPONINI 0.10*   BNP (last 3 results)  Recent Labs  11/05/14 2205  BNP 3626.3*    ProBNP (last 3 results)  Recent Labs  02/20/14 1515 02/24/14 1145 11/15/14 1622  PROBNP 15468.0* 10410.0* >4940.0*     CBG:  Recent Labs Lab 12/14/14 1447 12/14/14 1610 12/14/14 2143 12/15/14 0820 12/15/14 1056  GLUCAP 130* 171* 418* 240* 247*    Recent Results (from the past 240 hour(s))  Blood Culture (routine x 2)     Status: None   Collection Time: 12/09/14  8:20 PM  Result Value Ref Range Status   Specimen Description BLOOD RIGHT WRIST  Final   Special Requests BOTTLES DRAWN AEROBIC AND ANAEROBIC 5CC  Final   Culture NO GROWTH 5 DAYS  Final   Report Status 12/14/2014 FINAL  Final  Blood Culture (routine x 2)     Status: None   Collection Time: 12/09/14  8:25 PM  Result Value Ref Range Status   Specimen Description BLOOD RIGHT HAND  Final   Special Requests BOTTLES DRAWN AEROBIC AND ANAEROBIC 4CC  Final   Culture NO GROWTH 5 DAYS  Final   Report Status 12/14/2014 FINAL  Final  C difficile quick scan w PCR reflex     Status: Abnormal   Collection Time: 12/10/14  4:42 AM  Result Value Ref Range Status   C Diff antigen POSITIVE (A) NEGATIVE Final   C Diff toxin NEGATIVE NEGATIVE Final   C Diff interpretation   Final    C. difficile present, but toxin not detected. This indicates colonization. In most cases, this does not require treatment. If patient has signs and symptoms consistent with colitis, consider treatment.     Studies: No results found.  Scheduled Meds: . amiodarone  400 mg Oral Daily  . atorvastatin  40 mg Oral QHS  . calcitRIOL  1.75 mcg Oral Q M,W,F-HD  . colchicine  0.6 mg Oral Daily  . darbepoetin (ARANESP) injection - DIALYSIS  100 mcg Intravenous Q Mon-HD  . diltiazem  30 mg Oral 4 times per day  . feeding supplement (NEPRO CARB STEADY)  237 mL Oral BID BM  . fluocinonide ointment   Topical QID  . guaiFENesin  600 mg Oral BID  . insulin aspart  0-15 Units Subcutaneous TID WC  . insulin aspart  0-5 Units Subcutaneous QHS  . metoprolol tartrate  75 mg Oral BID  . multivitamin  1 tablet Oral QHS  . phenytoin  300 mg Oral q morning - 10a  .  piperacillin-tazobactam (ZOSYN)  IV  2.25 g Intravenous Q8H  . saccharomyces boulardii  250 mg Oral BID  . sodium chloride  3 mL Intravenous Q12H  . vancomycin  1,000 mg Intravenous Q M,W,F-HD  . Warfarin - Pharmacist Dosing Inpatient   Does not apply q1800   Continuous Infusions:      Time spent: 25 minutes    Charlann Wayne, [place]  [facility] Pager [phone]. If 7PM-7AM, please contact night-coverage at [email or web address], password [facility] 12/15/2014, 1:55 PM  LOS: 6 days

## 2014-12-16 ENCOUNTER — Inpatient Hospital Stay (HOSPITAL_COMMUNITY): Payer: 59

## 2014-12-16 DIAGNOSIS — G9340 Encephalopathy, unspecified: Secondary | ICD-10-CM | POA: Insufficient documentation

## 2014-12-16 DIAGNOSIS — G0481 Other encephalitis and encephalomyelitis: Secondary | ICD-10-CM | POA: Insufficient documentation

## 2014-12-16 LAB — GLUCOSE, CAPILLARY
GLUCOSE-CAPILLARY: 132 mg/dL — AB (ref 65–99)
GLUCOSE-CAPILLARY: 181 mg/dL — AB (ref 65–99)
GLUCOSE-CAPILLARY: 281 mg/dL — AB (ref 65–99)
GLUCOSE-CAPILLARY: 228 mg/dL — AB (ref 65–99)

## 2014-12-16 LAB — CBC
HCT: 25.5 % — ABNORMAL LOW (ref 39.0–52.0)
Hemoglobin: 8.6 g/dL — ABNORMAL LOW (ref 13.0–17.0)
MCH: 31.5 pg (ref 26.0–34.0)
MCHC: 33.7 g/dL (ref 30.0–36.0)
MCV: 93.4 fL (ref 78.0–100.0)
PLATELETS: 335 10*3/uL (ref 150–400)
RBC: 2.73 MIL/uL — AB (ref 4.22–5.81)
RDW: 19.3 % — AB (ref 11.5–15.5)
WBC: 17.5 10*3/uL — ABNORMAL HIGH (ref 4.0–10.5)

## 2014-12-16 LAB — RENAL FUNCTION PANEL
Albumin: 1.5 g/dL — ABNORMAL LOW (ref 3.5–5.0)
Anion gap: 14 (ref 5–15)
BUN: 43 mg/dL — AB (ref 6–20)
CALCIUM: 8 mg/dL — AB (ref 8.9–10.3)
CHLORIDE: 89 mmol/L — AB (ref 101–111)
CO2: 24 mmol/L (ref 22–32)
CREATININE: 5.9 mg/dL — AB (ref 0.61–1.24)
GFR calc non Af Amer: 9 mL/min — ABNORMAL LOW (ref >60)
GFR, EST AFRICAN AMERICAN: 11 mL/min — AB (ref >60)
GLUCOSE: 131 mg/dL — AB (ref 65–99)
Phosphorus: 3.6 mg/dL (ref 2.5–4.6)
Potassium: 3.6 mmol/L (ref 3.5–5.1)
SODIUM: 127 mmol/L — AB (ref 135–145)

## 2014-12-16 LAB — PROTIME-INR
INR: 4.37 — ABNORMAL HIGH (ref 0.00–1.49)
PROTHROMBIN TIME: 40.6 s — AB (ref 11.6–15.2)

## 2014-12-16 MED ORDER — DIPHENHYDRAMINE HCL 25 MG PO CAPS
50.0000 mg | ORAL_CAPSULE | Freq: Once | ORAL | Status: DC | PRN
  Administered 2014-12-16 – 2014-12-18 (×2): 50 mg via ORAL
  Filled 2014-12-16 (×3): qty 2

## 2014-12-16 MED ORDER — ZOLPIDEM TARTRATE 5 MG PO TABS
5.0000 mg | ORAL_TABLET | Freq: Once | ORAL | Status: AC
  Administered 2014-12-16: 5 mg via ORAL

## 2014-12-16 MED ORDER — VITAMIN K1 10 MG/ML IJ SOLN
10.0000 mg | Freq: Once | INTRAMUSCULAR | Status: AC
  Administered 2014-12-16: 10 mg via SUBCUTANEOUS
  Filled 2014-12-16: qty 1

## 2014-12-16 NOTE — Progress Notes (Addendum)
Subjective: stable, up in chair  Objective Vital signs in last 24 hours: Filed Vitals:   12/15/14 2342 12/16/14 0458 12/16/14 0629 12/16/14 1047  BP: 116/77 113/70  108/60  Pulse: 87 95  91  Temp:  98.7 F (37.1 C)    TempSrc:  Oral    Resp:  18    Height:      Weight:   83.87 kg (184 lb 14.4 oz)   SpO2:  96%     Weight change: -13.5 kg (-29 lb 12.2 oz)  Physical Exam: General: alert and no distress,pleasanly confused    Lungs=Chest clear bilat Card =Ireeg, irreg  VR stable in 90s  no MRG  ABD= Abd soft, no ascites or hsm  Extrem - 1-2+ LE edema bilat to hips  Dialysis Acess=L arm AVG +bruit   [facility]= [facility] 4 hrs 80kgs 3K/2.5Ca No Heparin Profile 2 Micera 100 q 2 weeks- to start 8/24 Calcitriol 1.75  Assessment: 1 ESRD HD Monday 2 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 Bed wt's inaccurate, weighed 74kg yest standing 8 Afib on amio and MTP  Plan - HD Mon, consult in to IR for tunneled cath on Monday for pheresis  Kelly Splinter MD pager [phone]    cell [phone] 12/16/2014, 1:26 PM    Labs: Basic Metabolic Panel:  Recent Labs Lab 12/14/14 0645 12/15/14 0316 12/16/14 0650  NA 130* 129* 127*  K 3.5 3.9 3.6  CL 92* 94* 89*  CO2 22 24 24   GLUCOSE 107* 292* 131*  BUN 36* 30* 43*  CREATININE 5.87* 4.47* 5.90*  CALCIUM 7.7* 7.9* 8.0*  PHOS 4.2 3.3 3.6   Liver Function Tests:  Recent Labs Lab 12/09/14 2030 12/10/14 0440  12/14/14 0645 12/15/14 0316 12/16/14 0650  AST 70* 62*  --   --   --   --   ALT 55 55  --   --   --   --   ALKPHOS 280* 274*  --   --   --   --   BILITOT 1.3* 1.9*  --   --   --   --   PROT 5.7* 6.2*  --   --   --   --   ALBUMIN 1.9* 1.9*  < > 1.6* 1.7* 1.5*  < > = values in this interval not displayed.  Recent Labs Lab 12/10/14 0440  LIPASE 38   No  results for input(s): AMMONIA in the last 168 hours. CBC:  Recent Labs Lab 12/09/14 2030  12/10/14 1616  12/11/14 0250 12/12/14 0804 12/13/14 0429 12/14/14 0459 12/16/14 0650  WBC 14.8*  < > 15.2*  < > 15.9* 11.7* 13.0* 17.8* 17.5*  NEUTROABS 12.7*  --  13.0*  --   --   --   --   --   --   HGB 8.6*  < > 8.4*  < > 8.7* 8.2* 8.3* 7.9* 8.6*  HCT 26.4*  < > 26.1*  < > 27.2* 24.7* 25.9* 23.9* 25.5*  MCV 96.4  < > 94.9  < > 96.1 92.5 93.5 92.3 93.4  PLT 157  < > 168  < > 189 231 260 296 335  < > = values in this interval not displayed. Cardiac Enzymes:  Recent Labs Lab 12/10/14 0440  TROPONINI  0.10*   CBG:  Recent Labs Lab 12/15/14 1056 12/15/14 1720 12/15/14 2118 12/16/14 0818 12/16/14 1221  GLUCAP 247* 293* 210* 132* 181*    Studies/Results: No results found. Medications:   . amiodarone  400 mg Oral Daily  . atorvastatin  40 mg Oral QHS  . calcitRIOL  1.75 mcg Oral Q M,W,F-HD  . colchicine  0.6 mg Oral Daily  . darbepoetin (ARANESP) injection - DIALYSIS  100 mcg Intravenous Q Mon-HD  . diltiazem  30 mg Oral 4 times per day  . feeding supplement (NEPRO CARB STEADY)  237 mL Oral BID BM  . fluocinonide ointment   Topical QID  . guaiFENesin  600 mg Oral BID  . insulin aspart  0-15 Units Subcutaneous TID WC  . insulin aspart  0-5 Units Subcutaneous QHS  . insulin glargine  8 Units Subcutaneous QHS  . [START ON 12/17/2014] levofloxacin  500 mg Oral Q48H  . metoprolol tartrate  75 mg Oral BID  . multivitamin  1 tablet Oral QHS  . phenytoin  300 mg Oral q morning - 10a  . saccharomyces boulardii  250 mg Oral BID  . sodium chloride  3 mL Intravenous Q12H  . Warfarin - Pharmacist Dosing Inpatient   Does not apply [phone]

## 2014-12-16 NOTE — Progress Notes (Addendum)
ANTICOAGULATION CONSULT NOTE - Follow Up Consult  Pharmacy Consult for Warfarin Indication: atrial fibrillation  Allergies  Allergen Reactions  . Keflex [Cephalexin] Other (See Comments)    c-diff reaction  . Dilaudid [Hydromorphone] Nausea And Vomiting    Patient Measurements: Height: 6' (182.9 cm) Weight: 184 lb 14.4 oz (83.87 kg) IBW/kg (Calculated) : 77.6  Vital Signs: Temp: 98.7 F (37.1 C) (08/28 0458) Temp Source: Oral (08/28 0458) BP: 113/70 mmHg (08/28 0458) Pulse Rate: 95 (08/28 0458)  Labs:  Recent Labs  12/14/14 0459 12/14/14 0645 12/15/14 0316 12/16/14 0650  HGB 7.9*  --   --  8.6*  HCT 23.9*  --   --  25.5*  PLT 296  --   --  335  LABPROT 31.7*  --  37.6* 40.6*  INR 3.14*  --  3.94* 4.37*  CREATININE  --  5.87* 4.47* 5.90*    Estimated Creatinine Clearance: 14.8 mL/min (by C-G formula based on Cr of 5.9).   Medications:  Scheduled:  . amiodarone  400 mg Oral Daily  . atorvastatin  40 mg Oral QHS  . calcitRIOL  1.75 mcg Oral Q M,W,F-HD  . colchicine  0.6 mg Oral Daily  . darbepoetin (ARANESP) injection - DIALYSIS  100 mcg Intravenous Q Mon-HD  . diltiazem  30 mg Oral 4 times per day  . feeding supplement (NEPRO CARB STEADY)  237 mL Oral BID BM  . fluocinonide ointment   Topical QID  . guaiFENesin  600 mg Oral BID  . insulin aspart  0-15 Units Subcutaneous TID WC  . insulin aspart  0-5 Units Subcutaneous QHS  . insulin glargine  8 Units Subcutaneous QHS  . [START ON 12/17/2014] levofloxacin  500 mg Oral Q48H  . metoprolol tartrate  75 mg Oral BID  . multivitamin  1 tablet Oral QHS  . phenytoin  300 mg Oral q morning - 10a  . saccharomyces boulardii  250 mg Oral BID  . sodium chloride  3 mL Intravenous Q12H  . Warfarin - Pharmacist Dosing Inpatient   Does not apply q1800   Infusions:   PRN: acetaminophen, benzonatate, diphenhydrAMINE  Assessment: 59 YOM on warfarin for hx Afib.INR elevated on admit, with hemoptysis, s/p Vit K 8/22. Coum  restarted 8/24 and INR is trending up despite held doses 3.14>3.94>4.37, Hgb 8.6- stable, plts wnl.Drug-drug interactions with amio and phenytoin Po, likely contributing to elevated INR. Will continue to hold.   Goal of Therapy:  INR 2-3 Monitor platelets by anticoagulation protocol: Yes   Plan:  Hold warfarin today Daily INR, CBC Q3days Monitor for s/sx of bleeding  Bennye Alm, PharmD Pharmacy Resident [phone]

## 2014-12-16 NOTE — Progress Notes (Signed)
Subjective:  Sleepy.  C/o swelling in legs.  Not SOB.  No chest pain  Objective:  Vital Signs in the last 24 hours: BP 113/70 mmHg  Pulse 95  Temp(Src) 98.7 F (37.1 C) (Oral)  Resp 18  Ht 6' (1.829 m)  Wt 83.87 kg (184 lb 14.4 oz)  BMI 25.07 kg/m2  SpO2 96%  Physical Exam: Pleasant black male sitting up at side of the bed in no acute distress Lungs:  Clear  Cardiac:  Irregular rhythm, normal S1 and S2, no S3 Extremities:  1-2 plus edema present  Intake/Output from previous day: 08/27 0701 - 08/28 0700 In: 1025 [P.O.:960; I.V.:15; IV Piggyback:50] Out: -  Weight Filed Weights   12/14/14 1243 12/15/14 1247 12/16/14 0629  Weight: 85 kg (187 lb 6.3 oz) 74.5 kg (164 lb 3.9 oz) 83.87 kg (184 lb 14.4 oz)    Lab Results: Basic Metabolic Panel:  Recent Labs  12/15/14 0316 12/16/14 0650  NA 129* 127*  K 3.9 3.6  CL 94* 89*  CO2 24 24  GLUCOSE 292* 131*  BUN 30* 43*  CREATININE 4.47* 5.90*    CBC:  Recent Labs  12/14/14 0459 12/16/14 0650  WBC 17.8* 17.5*  HGB 7.9* 8.6*  HCT 23.9* 25.5*  MCV 92.3 93.4  PLT 296 335    BNP    Component Value Date/Time   BNP 3626.3* 11/05/2014 2205    PROTIME: Lab Results  Component Value Date   INR 4.37* 12/16/2014   INR 3.94* 12/15/2014   INR 3.14* 12/14/2014    Telemetry: Atrial fibrillation with controlled response rate around 100  Assessment/Plan:  1.  Chronic atrial fibrillation rate currently controlled with increase in medications and resolution of sepsis 2.  End-stage renal disease on dialysis 3.  Hypertension 4.  Chronic anticoagulation  Recommendations:  Clinically doing quite well at this time.  Atrial fibrillation rate is controlled.  Appears to be mildly volume overloaded and needs dialysis tomorrow to remove this.  Sepsis appears to be resolved.  Cardiology will see as needed.      Kerry Hough  MD [facility] Card[facility]  12/16/2014, 8:17 AM

## 2014-12-16 NOTE — Progress Notes (Signed)
Subjective: No longer febrile. He tells me he is doing much better than when "I got to the ER last night." I inform him that he has been here for multiple days and he is surpised.   He denies abd pain  Exam: Filed Vitals:   12/16/14 0458  BP: 113/70  Pulse: 95  Temp: 98.7 F (37.1 C)  Resp: 18   Gen: eating breakfast.  Resp: non-labored breathing.  Abd: soft, NT    Gen: In bed, NAD MS: awake, oriented to hospital but not month.  Follows commands. Some confabulation.  CN: PERRLA, EOMI, TML, FACE symmetrical Motor: Moves bilateral upper extremities well, eating breakfast this am, and tray prevented LE exam.  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 He had an extensive workup including both a CSF and serum paraneoplastic panel ([facility] panel) which were negative. CSF infectious process was ruled out with LP.  EEG revealed frequent seizures at that time with BILATERAL epileptogenicity with frequent LEFT seizures. He had a CT chest abdomen and pelvis which did not reveal any signs of malignancy as well as a testicular ultrasound which was negative.   Of note, he did not have an IgG index done on his CSF but did have CSF IgG performed as part of his CSF Lyme titers and this showed elevated IgG, but unclear if it was isolated to the CSF.  Possibilities include a post-infectious encephalitis, other autoimmune encephalitis, new onset seizures without encephalitis(possibly related to hyperglycemia). He did have steroids earlier and it appears he did have some improvement with this.   At this point, the changes on MRI could represent injury or could represent ongoing encephalitis. Possibilities for treatment of this I feel would include immunosuppression.  I feel that it is reasonable to consider plasma  exchange empirically given that there are not commercially available tests for all described antibodies causing limbic encephalitis nor have we even characterized all antibodies that can be responsible for limbic encephalitis.   I would not favor repeating a course of steroids this close to his previous course.  I discussed with his wife the empiric nature of PLEX and that we did not have definitive evidence of auto-immunity, but given his previous response to steroids and continued encephalopathy, we have decided to proceed. He was febrile last week, but now has been afebrile for multiple days.   Recommendations: 1) Plan for starting PLEX tomorrow, appreciate nephrology assistance.  2) will continue to follow.   Roland Rack, MD [facility] [phone]  If 7pm- 7am, please page neurology on call as listed in [place]. 12/16/2014, 10:30 AM

## 2014-12-16 NOTE — Progress Notes (Addendum)
[facility] PROGRESS NOTE  Joseph [name] RCV:[id number] DOB: [birth year] DOA: 12/09/2014 PCP: Webb Silversmith, NP   Brief narrative 59 year old male with history of end-stage renal disease on dialysis (M, W, F), chronic A. fib on Coumadin, gout, type 2 diabetes mellitus with recent hospitalization for encephalopathy with MRI showing limbic encephalitis. During that time he had undergone LP which was unremarkable. He did respond to IV steroid initially which was discontinued as patient had C. difficile colitis. Patient brought to the ED as he was having persistent fever for one day associated with productive cough with some hemoptysis. He also reported having nausea and vomiting with diarrhea. On presentation patient was mildly hypotensive and received 1 L IV normal saline bolus in the ED following which his heart rate and blood pressure improved. Patient appeared to have some confusion and lethargy on presentation. Patient criteria for sepsis and imaging showed left-sided pneumonia. Patient placed on empiric antibiotics and admitted to stepdown.  Assessment/Plan: Sepsis secondary to healthcare associated pneumonia On empiric vancomycin and Zosyn. Switched to  Levaquin to complete 7 day course..(completes today). Blood culture negative. Hemoptysis on presentation resolved. H&H stable. Pulmonary signed off..  -Given persistent leukocytosis to be chest x-ray ordered. Does not show new infiltrate. Follow-up chest x-ray in 4 weeks to see for resolution. Repeat wbc in am.   Acute encephalopathy Associated with underlying sepsis versus ongoing encephalitis. Patient recently hospitalized for limbic encephalitis. MRI on admission showing persistent abnormal T2/FLAIR signal intensity involving mesial temporal lobes/hippocampi bilaterally which seems to have improved overall compared to MRI from 11/21/2014. -Patient remains confused off and on and occasionally hallucinating. Neurology consulted. EEG repeated  and is unremarkable for acute seizures. Received IV Solu-Medrol for 5 days during recent hospitalization. LP was negative. Ultrasound of the scrotum to rule out testicular malignancy was negative. (As per neurology to rule out paraneoplastic encephalitis). -Neurology suggest this could be post infectious versus autoimmune encephalitis. Recommends starting plasma exchange on 8/29. IR consulted for placement of a tunneled  Catheter.  Leukocytosis Repeat chest x-ray shows possible pneumonia (although no new infiltrate seen). Will request swallow evaluation given concern for aspiration. Having diarrhea again. Check repeat stool for C. difficile.  A. fib with RVR -After returning from dialysis on 8/22.  Required Cardizem and amiodarone drip. Now on by mouth amiodarone .  Off Cardizem drip  On metoprolol twice a day. - continue  Coumadin. Echo from 08/2014 showed LVH with EF of 50-55%  Nausea vomiting with diarrhea Stool for C. difficile  suggestive of colonization. Discontinued oral vancomycin.  N/V and diarrhea resolved.    ESRD on hemodialysis Renal following. Hemodialysis as scheduled  History of seizure Continue Dilantin.  Diabetes mellitus fsg improved after low-dose Lantus added.    Protein calorie malnutrition Continue supplements  Diet: Renal/heart healthy  DVT prophylaxis: coumadin / IV heparin today.   Code Status: Full code Family Communication: none at bedside Disposition Plan: Transfer to telemetry   Consultants:  Renal  Pulmonary  Procedures:  MRI brain  Antibiotics:  IV vancomycin and Zosyn since 8/22-8/27  levaquin 8/27-8/28  HPI/Subjective: Patient seen and examined. Having loose bowel movements today. Still very confused.  Objective: Filed Vitals:   12/16/14 1432  BP: 101/70  Pulse: 90  Temp: 98.3 F (36.8 C)  Resp: 18    Intake/Output Summary (Last 24 hours) at 12/16/14 1524 Last data filed at 12/16/14 1330  Gross per 24 hour   Intake    560 ml  Output  0 ml  Net    560 ml   Filed Weights   12/14/14 1243 12/15/14 1247 12/16/14 0629  Weight: 85 kg (187 lb 6.3 oz) 74.5 kg (164 lb 3.9 oz) 83.87 kg (184 lb 14.4 oz)    Exam:   General:  no acute distress  HEENT:  most oral mucosa   Respiratory: clear to auscultation bilaterally  CVS: S1 and S2  irregularly irregular, no murmurs   GI: Soft, nondistended, nontender, bowel sounds present  Musculoskeletal: Warm, no edema  CNS: Alert and oriented 1, confused, nonfocal  Data Reviewed: Basic Metabolic Panel:  Recent Labs Lab 12/10/14 1616 12/10/14 2100  12/12/14 0804 12/13/14 0429 12/14/14 0645 12/15/14 0316 12/16/14 0650  NA 130* 134*  < > 127* 132* 130* 129* 127*  K 3.8 4.2  < > 3.7 3.8 3.5 3.9 3.6  CL 96* 97*  < > 90* 93* 92* 94* 89*  CO2 23 23  < > 23 24 22 24 24   GLUCOSE 221* 196*  < > 206* 218* 107* 292* 131*  BUN 41* 26*  < > 40* 24* 36* 30* 43*  CREATININE 6.62* 4.64*  < > 6.37* 4.22* 5.87* 4.47* 5.90*  CALCIUM 7.7* 7.6*  < > 7.9* 7.9* 7.7* 7.9* 8.0*  MG  --   --   --   --   --   --  2.2  --   PHOS 3.1 2.6  --   --   --  4.2 3.3 3.6  < > = values in this interval not displayed. Liver Function Tests:  Recent Labs Lab 12/09/14 2030 12/10/14 0440 12/10/14 1616 12/10/14 2100 12/14/14 0645 12/15/14 0316 12/16/14 0650  AST 70* 62*  --   --   --   --   --   ALT 55 55  --   --   --   --   --   ALKPHOS 280* 274*  --   --   --   --   --   BILITOT 1.3* 1.9*  --   --   --   --   --   PROT 5.7* 6.2*  --   --   --   --   --   ALBUMIN 1.9* 1.9* 1.7* 1.7* 1.6* 1.7* 1.5*    Recent Labs Lab 12/10/14 0440  LIPASE 38   No results for input(s): AMMONIA in the last 168 hours. CBC:  Recent Labs Lab 12/09/14 2030  12/10/14 1616  12/11/14 0250 12/12/14 0804 12/13/14 0429 12/14/14 0459 12/16/14 0650  WBC 14.8*  < > 15.2*  < > 15.9* 11.7* 13.0* 17.8* 17.5*  NEUTROABS 12.7*  --  13.0*  --   --   --   --   --   --   HGB 8.6*  < >  8.4*  < > 8.7* 8.2* 8.3* 7.9* 8.6*  HCT 26.4*  < > 26.1*  < > 27.2* 24.7* 25.9* 23.9* 25.5*  MCV 96.4  < > 94.9  < > 96.1 92.5 93.5 92.3 93.4  PLT 157  < > 168  < > 189 231 260 296 335  < > = values in this interval not displayed. Cardiac Enzymes:  Recent Labs Lab 12/10/14 0440  TROPONINI 0.10*   BNP (last 3 results)  Recent Labs  11/05/14 2205  BNP 3626.3*    ProBNP (last 3 results)  Recent Labs  02/20/14 1515 02/24/14 1145 11/15/14 1622  PROBNP 15468.0* 10410.0* >4940.0*    CBG:  Recent Labs Lab 12/15/14 1056 12/15/14 1720 12/15/14 2118 12/16/14 0818 12/16/14 1221  GLUCAP 247* 293* 210* 132* 181*    Recent Results (from the past 240 hour(s))  Blood Culture (routine x 2)     Status: None   Collection Time: 12/09/14  8:20 PM  Result Value Ref Range Status   Specimen Description BLOOD RIGHT WRIST  Final   Special Requests BOTTLES DRAWN AEROBIC AND ANAEROBIC 5CC  Final   Culture NO GROWTH 5 DAYS  Final   Report Status 12/14/2014 FINAL  Final  Blood Culture (routine x 2)     Status: None   Collection Time: 12/09/14  8:25 PM  Result Value Ref Range Status   Specimen Description BLOOD RIGHT HAND  Final   Special Requests BOTTLES DRAWN AEROBIC AND ANAEROBIC 4CC  Final   Culture NO GROWTH 5 DAYS  Final   Report Status 12/14/2014 FINAL  Final  C difficile quick scan w PCR reflex     Status: Abnormal   Collection Time: 12/10/14  4:42 AM  Result Value Ref Range Status   C Diff antigen POSITIVE (A) NEGATIVE Final   C Diff toxin NEGATIVE NEGATIVE Final   C Diff interpretation   Final    C. difficile present, but toxin not detected. This indicates colonization. In most cases, this does not require treatment. If patient has signs and symptoms consistent with colitis, consider treatment.     Studies: Dg Chest Port 1 View  12/16/2014   CLINICAL DATA:  Pneumonia, persistent leukocytosis, history atrial fibrillation, hypertension, diabetes mellitus, CHF, former  smoker, end-stage renal disease on dialysis  EXAM: PORTABLE CHEST - 1 VIEW  COMPARISON:  Portable exam 1429 hours compared 12/12/2014  FINDINGS: Enlargement of cardiac silhouette with vascular congestion.  Mediastinal contour stable.  BILATERAL perihilar and basilar infiltrates question pulmonary edema versus infection.  Probable bibasilar effusions.  No pneumothorax.  IMPRESSION: Enlargement of cardiac silhouette with pulmonary vascular congestion.  Perihilar and basilar infiltrates with associated pleural effusions, question pulmonary edema versus pneumonia.   Electronically Signed   By: Lavonia Dana M.D.   On: 12/16/2014 14:55    Scheduled Meds: . amiodarone  400 mg Oral Daily  . atorvastatin  40 mg Oral QHS  . calcitRIOL  1.75 mcg Oral Q M,W,F-HD  . colchicine  0.6 mg Oral Daily  . darbepoetin (ARANESP) injection - DIALYSIS  100 mcg Intravenous Q Mon-HD  . diltiazem  30 mg Oral 4 times per day  . feeding supplement (NEPRO CARB STEADY)  237 mL Oral BID BM  . fluocinonide ointment   Topical QID  . guaiFENesin  600 mg Oral BID  . insulin aspart  0-15 Units Subcutaneous TID WC  . insulin aspart  0-5 Units Subcutaneous QHS  . insulin glargine  8 Units Subcutaneous QHS  . [START ON 12/17/2014] levofloxacin  500 mg Oral Q48H  . metoprolol tartrate  75 mg Oral BID  . multivitamin  1 tablet Oral QHS  . phenytoin  300 mg Oral q morning - 10a  . saccharomyces boulardii  250 mg Oral BID  . sodium chloride  3 mL Intravenous Q12H  . Warfarin - Pharmacist Dosing Inpatient   Does not apply q1800   Continuous Infusions:      Time spent: 25 minutes    Joseph [name], [place]  [facility] Pager [phone]. If 7PM-7AM, please contact night-coverage at [email or web address], password [facility] 12/16/2014, 3:24 PM  LOS: 7 days

## 2014-12-16 NOTE — Progress Notes (Signed)
ANTICOAGULATION CONSULT NOTE - Follow Up Consult  Pharmacy Consult for Warfarin Indication: atrial fibrillation  Allergies  Allergen Reactions  . Keflex [Cephalexin] Other (See Comments)    c-diff reaction  . Dilaudid [Hydromorphone] Nausea And Vomiting    Patient Measurements: Height: 6' (182.9 cm) Weight: 184 lb 14.4 oz (83.87 kg) IBW/kg (Calculated) : 77.6  Vital Signs: Temp: 98.3 F (36.8 C) (08/28 1432) Temp Source: Oral (08/28 1432) BP: 101/70 mmHg (08/28 1432) Pulse Rate: 90 (08/28 1432)  Labs:  Recent Labs  12/14/14 0459 12/14/14 0645 12/15/14 0316 12/16/14 0650  HGB 7.9*  --   --  8.6*  HCT 23.9*  --   --  25.5*  PLT 296  --   --  335  LABPROT 31.7*  --  37.6* 40.6*  INR 3.14*  --  3.94* 4.37*  CREATININE  --  5.87* 4.47* 5.90*    Estimated Creatinine Clearance: 14.8 mL/min (by C-G formula based on Cr of 5.9).   Medications:  Scheduled:  . amiodarone  400 mg Oral Daily  . atorvastatin  40 mg Oral QHS  . calcitRIOL  1.75 mcg Oral Q M,W,F-HD  . colchicine  0.6 mg Oral Daily  . darbepoetin (ARANESP) injection - DIALYSIS  100 mcg Intravenous Q Mon-HD  . diltiazem  30 mg Oral 4 times per day  . feeding supplement (NEPRO CARB STEADY)  237 mL Oral BID BM  . fluocinonide ointment   Topical QID  . guaiFENesin  600 mg Oral BID  . insulin aspart  0-15 Units Subcutaneous TID WC  . insulin aspart  0-5 Units Subcutaneous QHS  . insulin glargine  8 Units Subcutaneous QHS  . [START ON 12/17/2014] levofloxacin  500 mg Oral Q48H  . metoprolol tartrate  75 mg Oral BID  . multivitamin  1 tablet Oral QHS  . phenytoin  300 mg Oral q morning - 10a  . saccharomyces boulardii  250 mg Oral BID  . sodium chloride  3 mL Intravenous Q12H  . Warfarin - Pharmacist Dosing Inpatient   Does not apply q1800   Infusions:   PRN: acetaminophen, benzonatate, diphenhydrAMINE  Assessment: 59 YOM on warfarin for hx Afib.INR elevated on admit, with hemoptysis, s/p Vit K 8/22. Coum  restarted 8/24 and INR is trending up despite held doses 3.14>3.94>4.37, Hgb 8.6- stable, plts wnl.Drug-drug interactions with amio and phenytoin Po, likely contributing to elevated INR. Will continue to hold.   Goal of Therapy:  INR 2-3 Monitor platelets by anticoagulation protocol: Yes   Plan:  Hold warfarin today Daily INR, CBC Q3days Monitor for s/sx of bleeding  Bennye Alm, PharmD Pharmacy Resident [phone]  Addendum: Now holding Coumadin and starting IV heparin for possible placement of tunneled cath tomorrow for plasma exchange. Will plan to start IV heparin when INR < 2.   Albertina Parr, PharmD., BCPS Clinical Pharmacist Pager [phone]

## 2014-12-17 ENCOUNTER — Inpatient Hospital Stay (HOSPITAL_COMMUNITY): Payer: 59

## 2014-12-17 DIAGNOSIS — I480 Paroxysmal atrial fibrillation: Secondary | ICD-10-CM

## 2014-12-17 DIAGNOSIS — D72829 Elevated white blood cell count, unspecified: Secondary | ICD-10-CM

## 2014-12-17 LAB — PROTIME-INR
INR: 2.42 — AB (ref 0.00–1.49)
Prothrombin Time: 26.1 seconds — ABNORMAL HIGH (ref 11.6–15.2)

## 2014-12-17 LAB — CBC
HEMATOCRIT: 27.2 % — AB (ref 39.0–52.0)
HEMATOCRIT: 25.8 % — AB (ref 39.0–52.0)
HEMOGLOBIN: 8.7 g/dL — AB (ref 13.0–17.0)
Hemoglobin: 8.7 g/dL — ABNORMAL LOW (ref 13.0–17.0)
MCH: 29 pg (ref 26.0–34.0)
MCH: 30.9 pg (ref 26.0–34.0)
MCHC: 32 g/dL (ref 30.0–36.0)
MCHC: 33.7 g/dL (ref 30.0–36.0)
MCV: 90.7 fL (ref 78.0–100.0)
MCV: 91.5 fL (ref 78.0–100.0)
PLATELETS: 347 10*3/uL (ref 150–400)
PLATELETS: 368 10*3/uL (ref 150–400)
RBC: 3 MIL/uL — AB (ref 4.22–5.81)
RBC: 2.82 MIL/uL — AB (ref 4.22–5.81)
RDW: 19.2 % — ABNORMAL HIGH (ref 11.5–15.5)
RDW: 19.1 % — ABNORMAL HIGH (ref 11.5–15.5)
WBC: 19.5 10*3/uL — AB (ref 4.0–10.5)
WBC: 20.6 10*3/uL — AB (ref 4.0–10.5)

## 2014-12-17 LAB — RENAL FUNCTION PANEL
ALBUMIN: 1.6 g/dL — AB (ref 3.5–5.0)
ANION GAP: 14 (ref 5–15)
Albumin: 1.6 g/dL — ABNORMAL LOW (ref 3.5–5.0)
Anion gap: 15 (ref 5–15)
BUN: 56 mg/dL — AB (ref 6–20)
BUN: 57 mg/dL — ABNORMAL HIGH (ref 6–20)
CALCIUM: 8.2 mg/dL — AB (ref 8.9–10.3)
CHLORIDE: 89 mmol/L — AB (ref 101–111)
CHLORIDE: 89 mmol/L — AB (ref 101–111)
CO2: 22 mmol/L (ref 22–32)
CO2: 23 mmol/L (ref 22–32)
CREATININE: 6.72 mg/dL — AB (ref 0.61–1.24)
CREATININE: 6.84 mg/dL — AB (ref 0.61–1.24)
Calcium: 8.2 mg/dL — ABNORMAL LOW (ref 8.9–10.3)
GFR, EST AFRICAN AMERICAN: 9 mL/min — AB (ref >60)
GFR, EST AFRICAN AMERICAN: 9 mL/min — AB (ref >60)
GFR, EST NON AFRICAN AMERICAN: 8 mL/min — AB (ref >60)
GFR, EST NON AFRICAN AMERICAN: 8 mL/min — AB (ref >60)
Glucose, Bld: 79 mg/dL (ref 65–99)
Glucose, Bld: 128 mg/dL — ABNORMAL HIGH (ref 65–99)
PHOSPHORUS: 3.8 mg/dL (ref 2.5–4.6)
POTASSIUM: 3.5 mmol/L (ref 3.5–5.1)
Phosphorus: 4.6 mg/dL (ref 2.5–4.6)
Potassium: 4 mmol/L (ref 3.5–5.1)
Sodium: 126 mmol/L — ABNORMAL LOW (ref 135–145)
Sodium: 126 mmol/L — ABNORMAL LOW (ref 135–145)

## 2014-12-17 LAB — GLUCOSE, CAPILLARY
GLUCOSE-CAPILLARY: 104 mg/dL — AB (ref 65–99)
GLUCOSE-CAPILLARY: 238 mg/dL — AB (ref 65–99)
Glucose-Capillary: 73 mg/dL (ref 65–99)
Glucose-Capillary: 96 mg/dL (ref 65–99)

## 2014-12-17 LAB — C DIFFICILE QUICK SCREEN W PCR REFLEX
C DIFFICILE (CDIFF) INTERP: NEGATIVE
C Diff antigen: NEGATIVE
C Diff toxin: NEGATIVE

## 2014-12-17 MED ORDER — NEPRO/CARBSTEADY PO LIQD: 237.0000 mL | ORAL | Status: DC | PRN

## 2014-12-17 MED ORDER — ALTEPLASE 2 MG IJ SOLR
2.0000 mg | Freq: Once | INTRAMUSCULAR | Status: DC | PRN
  Filled 2014-12-17: qty 2

## 2014-12-17 MED ORDER — HEPARIN SODIUM (PORCINE) 1000 UNIT/ML DIALYSIS
1000.0000 [IU] | INTRAMUSCULAR | Status: DC | PRN
  Filled 2014-12-17: qty 1

## 2014-12-17 MED ORDER — SODIUM CHLORIDE 0.9 % IV SOLN: 100.0000 mL | INTRAVENOUS | Status: DC | PRN

## 2014-12-17 MED ORDER — DARBEPOETIN ALFA 100 MCG/0.5ML IJ SOSY
PREFILLED_SYRINGE | INTRAMUSCULAR | Status: AC
  Administered 2014-12-17: 100 ug via INTRAVENOUS
  Filled 2014-12-17: qty 0.5

## 2014-12-17 MED ORDER — LIDOCAINE HCL 1 % IJ SOLN
INTRAMUSCULAR | Status: AC
  Filled 2014-12-17: qty 20

## 2014-12-17 MED ORDER — LIDOCAINE-PRILOCAINE 2.5-2.5 % EX CREA
1.0000 "application " | TOPICAL_CREAM | CUTANEOUS | Status: DC | PRN
  Filled 2014-12-17: qty 5

## 2014-12-17 MED ORDER — VANCOMYCIN 50 MG/ML ORAL SOLUTION
250.0000 mg | Freq: Four times a day (QID) | ORAL | Status: AC
  Administered 2014-12-17 – 2014-12-23 (×24): 250 mg via ORAL
  Filled 2014-12-17 (×30): qty 5

## 2014-12-17 MED ORDER — LIDOCAINE HCL (PF) 1 % IJ SOLN
5.0000 mL | INTRAMUSCULAR | Status: DC | PRN
  Filled 2014-12-17: qty 5

## 2014-12-17 MED ORDER — HEPARIN SODIUM (PORCINE) 1000 UNIT/ML IJ SOLN
INTRAMUSCULAR | Status: AC
  Filled 2014-12-17: qty 1

## 2014-12-17 MED ORDER — PENTAFLUOROPROP-TETRAFLUOROETH EX AERO: 1.0000 "application " | INHALATION_SPRAY | CUTANEOUS | Status: DC | PRN

## 2014-12-17 MED ORDER — LORAZEPAM 2 MG/ML IJ SOLN
1.0000 mg | Freq: Once | INTRAMUSCULAR | Status: AC
  Administered 2014-12-17: 1 mg via INTRAVENOUS
  Filled 2014-12-17: qty 1

## 2014-12-17 NOTE — Progress Notes (Signed)
Occupational Therapy Cancellation Note    12/17/14 1130  OT Visit Information  Last OT Received On 12/17/14  Reason Eval/Treat Not Completed Patient at procedure or test/ unavailable  Lucille Passy, OTR/L [phone]

## 2014-12-17 NOTE — Progress Notes (Signed)
PT Cancellation Note:  Pt checked on several times and in HD. Will check back as time allows.   Leighton Roach, [place]  [phone]

## 2014-12-17 NOTE — Procedures (Signed)
I have seen and examined this patient and agree with the plan of care. Patient seen on dialysis and is comfortable. Chastin Garlitz W 12/17/2014, 10:06 AM

## 2014-12-17 NOTE — Progress Notes (Signed)
Inpatient Diabetes Program Recommendations  AACE/ADA: New Consensus Statement on Inpatient Glycemic Control (2013)  Target Ranges:  Prepandial:   less than 140 mg/dL      Peak postprandial:   less than 180 mg/dL (1-2 hours)      Critically ill patients:  140 - 180 mg/dL   Results for BASHEER, MOLCHAN (MRN [id number]) as of 12/17/2014 10:30  Ref. Range 12/16/2014 08:18 12/16/2014 12:21 12/16/2014 17:30 12/16/2014 21:18 12/17/2014 07:54  Glucose-Capillary Latest Ref Range: 65-99 mg/dL 132 (H) 181 (H) 281 (H) 228 (H) 73   Current orders for Inpatient glycemic control: Lantus 8 units QHS, Novolog 0-15 units TID with meals, Novolog 0-5 units HS  Inpatient Diabetes Program Recommendations Insulin - Meal Coverage: Please consider ordering Novolog 4 units TID with meals for meal coverage (in addition to Novolog correction scale).  Thanks, Barnie Alderman, RN, MSN, CCRN, CDE Diabetes Coordinator Inpatient Diabetes Program [phone] (Team Pager from [place] to [place]) [phone] ([facility] office) [phone] [facility] office) [phone] [facility] office)

## 2014-12-17 NOTE — Progress Notes (Addendum)
[facility] PROGRESS NOTE  CHEROKEE CLOWERS HUT:[id number] DOB: 20-Sep-1955 DOA: 12/09/2014 PCP: Webb Silversmith, NP   Brief narrative 59 year old male with history of end-stage renal disease on dialysis (M, W, F), chronic A. fib on Coumadin, gout, type 2 diabetes mellitus with recent hospitalization for encephalopathy with MRI showing limbic encephalitis. During that time he had undergone LP which was unremarkable. He did respond to IV steroid initially which was discontinued as patient had C. difficile colitis. Patient brought to the ED as he was having persistent fever for one day associated with productive cough with some hemoptysis. He also reported having nausea and vomiting with diarrhea. On presentation patient was mildly hypotensive and received 1 L IV normal saline bolus in the ED following which his heart rate and blood pressure improved. Patient appeared to have some confusion and lethargy on presentation. Patient criteria for sepsis and imaging showed left-sided pneumonia. Patient placed on empiric antibiotics and admitted to stepdown.  Assessment/Plan: Sepsis secondary to healthcare associated pneumonia On empiric vancomycin and Zosyn. Switched to  Levaquin to complete 7 day course..(Completed on 8/28). Blood culture negative. Hemoptysis on presentation resolved. H&H stable. Pulmonary signed off..  -Given persistent leukocytosis to be chest x-ray ordered. Does not show new infiltrate. Follow-up chest x-ray in 4 weeks to see for resolution.   Persistent leukocytosis Patient having off-and-on diarrhea. Stool for C. difficile this admission positive for antigen but negative for toxins history of colon physician. However given recent C. difficile colitis that was treated for 2 weeks and now presenting with sepsis and episodic diarrhea will start oral vancomycin again. Discussed with ID consult Dr. Megan Salon who recommends the same. Check repeat stool for C. difficile. (If negative will treat  for 7 days, but if positive will treat for total 2 weeks again)  Acute encephalopathy Associated with underlying sepsis versus ongoing encephalitis. Patient recently hospitalized for limbic encephalitis. MRI on admission showing persistent abnormal T2/FLAIR signal intensity involving mesial temporal lobes/hippocampi bilaterally which seems to have improved overall compared to MRI from 11/21/2014. -Patient remains confused off and on and occasionally hallucinating. Neurology consulted. EEG repeated and is unremarkable for acute seizures. Received IV Solu-Medrol for 5 days during recent hospitalization. LP was negative. Ultrasound of the scrotum to rule out testicular malignancy was negative. (As per neurology to rule out paraneoplastic encephalitis). -Neurology suggest this could be post infectious versus autoimmune encephalitis. Recommends starting plasma exchange on 8/29. Tunnel is to catheter to be placed by IR. Coumadin held and started on IV heparin.   A. fib with RVR -After returning from dialysis on 8/22.  Required Cardizem and amiodarone drip. Now on by mouth amiodarone .  Off Cardizem drip  On metoprolol twice a day. - Coumadin held for placement of HD catheter for plasma exchange. On IV heparin. Echo from 08/2014 showed LVH with EF of 50-55%    ESRD on hemodialysis Renal following. Hemodialysis as scheduled  History of seizure Continue Dilantin.  Diabetes mellitus fsg improved after low-dose Lantus added.    Protein calorie malnutrition Continue supplements  Diet: Renal/heart healthy  DVT prophylaxis: coumadin / IV heparin   Code Status: Full code Family Communication: none at bedside Disposition Plan: Continue telemetry monitoring   Consultants:  Renal  Pulmonary  Procedures:  MRI brain  Antibiotics:  IV vancomycin and Zosyn since 8/22-8/27  levaquin 8/27-8/28  HPI/Subjective: Patient seen and examined. Reports off and on loose bowel movement. (not  clear how many episodes hes had since yesterday). Still confused.  Objective:  Filed Vitals:   12/17/14 1200  BP: 96/79  Pulse: 102  Temp:   Resp: 16    Intake/Output Summary (Last 24 hours) at 12/17/14 1219 Last data filed at 12/17/14 0932  Gross per 24 hour  Intake    900 ml  Output      0 ml  Net    900 ml   Filed Weights   12/15/14 1247 12/16/14 0629 12/17/14 0927  Weight: 74.5 kg (164 lb 3.9 oz) 83.87 kg (184 lb 14.4 oz) 84.2 kg (185 lb 10 oz)    Exam:   General:  no acute distress  HEENT:  most oral mucosa   Respiratory: clear to auscultation bilaterally  CVS: S1 and S2  irregularly irregular, no murmurs   GI: Soft, nondistended, nontender, bowel sounds present  Musculoskeletal: Warm, no edema, multiple pigmented skin lesions on the back ( present since prior hospitalization)  CNS: Alert and oriented 1-2, confused, nonfocal  Data Reviewed: Basic Metabolic Panel:  Recent Labs Lab 12/14/14 0645 12/15/14 0316 12/16/14 0650 12/17/14 0314 12/17/14 0943  NA 130* 129* 127* 126* 126*  K 3.5 3.9 3.6 4.0 3.5  CL 92* 94* 89* 89* 89*  CO2 22 24 24 22 23   GLUCOSE 107* 292* 131* 79 128*  BUN 36* 30* 43* 56* 57*  CREATININE 5.87* 4.47* 5.90* 6.72* 6.84*  CALCIUM 7.7* 7.9* 8.0* 8.2* 8.2*  MG  --  2.2  --   --   --   PHOS 4.2 3.3 3.6 3.8 4.6   Liver Function Tests:  Recent Labs Lab 12/14/14 0645 12/15/14 0316 12/16/14 0650 12/17/14 0314 12/17/14 0943  ALBUMIN 1.6* 1.7* 1.5* 1.6* 1.6*   No results for input(s): LIPASE, AMYLASE in the last 168 hours. No results for input(s): AMMONIA in the last 168 hours. CBC:  Recent Labs Lab 12/10/14 1616  12/13/14 0429 12/14/14 0459 12/16/14 0650 12/17/14 0314 12/17/14 0942  WBC 15.2*  < > 13.0* 17.8* 17.5* 19.5* 20.6*  NEUTROABS 13.0*  --   --   --   --   --   --   HGB 8.4*  < > 8.3* 7.9* 8.6* 8.7* 8.7*  HCT 26.1*  < > 25.9* 23.9* 25.5* 27.2* 25.8*  MCV 94.9  < > 93.5 92.3 93.4 90.7 91.5  PLT 168  < >  260 296 335 347 368  < > = values in this interval not displayed. Cardiac Enzymes: No results for input(s): CKTOTAL, CKMB, CKMBINDEX, TROPONINI in the last 168 hours. BNP (last 3 results)  Recent Labs  11/05/14 2205  BNP 3626.3*    ProBNP (last 3 results)  Recent Labs  02/20/14 1515 02/24/14 1145 11/15/14 1622  PROBNP 15468.0* 10410.0* >4940.0*    CBG:  Recent Labs Lab 12/16/14 0818 12/16/14 1221 12/16/14 1730 12/16/14 2118 12/17/14 0754  GLUCAP 132* 181* 281* 228* 73    Recent Results (from the past 240 hour(s))  Blood Culture (routine x 2)     Status: None   Collection Time: 12/09/14  8:20 PM  Result Value Ref Range Status   Specimen Description BLOOD RIGHT WRIST  Final   Special Requests BOTTLES DRAWN AEROBIC AND ANAEROBIC 5CC  Final   Culture NO GROWTH 5 DAYS  Final   Report Status 12/14/2014 FINAL  Final  Blood Culture (routine x 2)     Status: None   Collection Time: 12/09/14  8:25 PM  Result Value Ref Range Status   Specimen Description BLOOD RIGHT HAND  Final  Special Requests BOTTLES DRAWN AEROBIC AND ANAEROBIC 4CC  Final   Culture NO GROWTH 5 DAYS  Final   Report Status 12/14/2014 FINAL  Final  C difficile quick scan w PCR reflex     Status: Abnormal   Collection Time: 12/10/14  4:42 AM  Result Value Ref Range Status   C Diff antigen POSITIVE (A) NEGATIVE Final   C Diff toxin NEGATIVE NEGATIVE Final   C Diff interpretation   Final    C. difficile present, but toxin not detected. This indicates colonization. In most cases, this does not require treatment. If patient has signs and symptoms consistent with colitis, consider treatment.     Studies: Dg Chest Port 1 View  12/16/2014   CLINICAL DATA:  Pneumonia, persistent leukocytosis, history atrial fibrillation, hypertension, diabetes mellitus, CHF, former smoker, end-stage renal disease on dialysis  EXAM: PORTABLE CHEST - 1 VIEW  COMPARISON:  Portable exam 1429 hours compared 12/12/2014  FINDINGS:  Enlargement of cardiac silhouette with vascular congestion.  Mediastinal contour stable.  BILATERAL perihilar and basilar infiltrates question pulmonary edema versus infection.  Probable bibasilar effusions.  No pneumothorax.  IMPRESSION: Enlargement of cardiac silhouette with pulmonary vascular congestion.  Perihilar and basilar infiltrates with associated pleural effusions, question pulmonary edema versus pneumonia.   Electronically Signed   By: Lavonia Dana M.D.   On: 12/16/2014 14:55    Scheduled Meds: . amiodarone  400 mg Oral Daily  . atorvastatin  40 mg Oral QHS  . calcitRIOL  1.75 mcg Oral Q M,W,F-HD  . colchicine  0.6 mg Oral Daily  . Darbepoetin Alfa      . darbepoetin (ARANESP) injection - DIALYSIS  100 mcg Intravenous Q Mon-HD  . diltiazem  30 mg Oral 4 times per day  . feeding supplement (NEPRO CARB STEADY)  237 mL Oral BID BM  . fluocinonide ointment   Topical QID  . guaiFENesin  600 mg Oral BID  . insulin aspart  0-15 Units Subcutaneous TID WC  . insulin aspart  0-5 Units Subcutaneous QHS  . insulin glargine  8 Units Subcutaneous QHS  . metoprolol tartrate  75 mg Oral BID  . multivitamin  1 tablet Oral QHS  . phenytoin  300 mg Oral q morning - 10a  . saccharomyces boulardii  250 mg Oral BID  . sodium chloride  3 mL Intravenous Q12H  . vancomycin  250 mg Oral Q6H   Continuous Infusions:      Time spent: 25 minutes    Xavi Tomasik, [place]  [facility] Pager [phone]. If 7PM-7AM, please contact night-coverage at [email or web address], password [facility] 12/17/2014, 12:19 PM  LOS: 8 days

## 2014-12-17 NOTE — Progress Notes (Signed)
Plasma exchange to be initiated. Last therapy session 8/26 min assist and heart rate and rhythm limiting factor with therapy. [facility] unlikely to approve an inpt rehab admission but I will follow to assist when pt medically ready for rehab venue determination. Wife is aware that I am involved at a distance and that he may need SNF level rehab which is location of last d/c to [facility] on 8/12 with readmission directly to [facility] on 8/22. I discussed with RN CM. [phone]

## 2014-12-17 NOTE — Care Management Note (Signed)
Case Management Note  Patient Details  Name: DONOVIN KRAEMER MRN: [id number] Date of Birth: 06-04-1955  Subjective/Objective:     NCM spoke with Mrs Basey , she states patient did come from [facility] but he will not be going back there, she states she wants to see if her insurance will approve CIR, NCM informed her that most likely [facility] would not,  She then states they would be interested in another snf, if that is the case.  NCM will make CSW aware.                 Action/Plan:   Expected Discharge Date:                  Expected Discharge Plan:  IP Rehab Facility  In-House Referral:  Clinical Social Work  Discharge planning Services  CM Consult  Post Acute Care Choice:    Choice offered to:     DME Arranged:    DME Agency:     HH Arranged:    Day Agency:     Status of Service:  In process, will continue to follow  Medicare Important Message Given:  Yes-second notification given Date Medicare IM Given:    Medicare IM give by:    Date Additional Medicare IM Given:    Additional Medicare Important Message give by:     If discussed at [place] of Stay Meetings, dates discussed:    Additional Comments:  [name], RN 12/17/2014, 5:35 PM

## 2014-12-17 NOTE — Consult Note (Signed)
Chief Complaint: Patient was seen in consultation today for plasma pheresis catheter placement Chief Complaint  Patient presents with  . Code Sepsis   at the request of Dr Leonel Ramsay  Referring Physician(s): Leonel Ramsay  History of Present Illness: Joseph [name] is a 59 y.o. 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 Atrial fibrillation     a. 11/18/2011 s/p DCCV - 150J;  b. Anticoagulation w/ Apixaban;  c. 11/2011 back in afib->asymptomatic.  . H/O alcohol abuse     a. drinks heavily on the weekends.  . Hypertension   . Diabetes mellitus   . Heart murmur     a. 09/2011 Echo: EF 55-60%, Triv AI, Mild MR, mildly dil LA.  . Diabetic peripheral neuropathy   . CKD (chronic kidney disease), stage III   . CHF (congestive heart failure)   . Pneumonia   . Sleep apnea     Past Surgical History  Procedure Laterality Date  . Eye sx  11/11/2011    left eye  . Cardioversion  11/18/2011    Procedure: CARDIOVERSION;  Surgeon: Josue Hector, MD;  Location: [facility] ENDOSCOPY;  Service: Cardiovascular;  Laterality: N/A;  . Colonoscopy w/ biopsies and polypectomy    . Av fistula placement Left 02/13/2014    Procedure: INSERTION OF ARTERIOVENOUS (AV) GORE-TEX GRAFT ARM;  Surgeon: Angelia Mould, MD;  Location: [place];  Service: Vascular;  Laterality: Left;  . Right heart catheterization N/A 10/11/2013    Procedure: RIGHT HEART CATH;  Surgeon: Larey Dresser, MD;  Location: [facility] CATH LAB;  Service: Cardiovascular;  Laterality: N/A;    Allergies: Keflex and  Dilaudid  Medications: Prior to Admission medications   Medication Sig Start Date End Date Taking? Authorizing Provider  atorvastatin (LIPITOR) 40 MG tablet Take 40 mg by mouth at bedtime.    Yes Historical Provider, MD  calcitRIOL (ROCALTROL) 0.25 MCG capsule Take 7 capsules (1.75 mcg total) by mouth every Monday, Wednesday, and Friday with hemodialysis. 12/02/14  Yes Thurnell Lose, MD  colchicine 0.6 MG tablet TAKE 1 TABLET (0.6 MG TOTAL) BY MOUTH 2 (TWO) TIMES DAILY. 08/14/14  Yes Jearld Fenton, NP  diltiazem (CARDIZEM CD) 120 MG 24 hr capsule Take 1 capsule (120 mg total) by mouth daily. 03/03/14  Yes Donne Hazel, MD  ethyl chloride spray Apply 1 application topically every other day. Mon Wed Fri - done with dialysis 05/12/14  Yes Historical Provider, MD  fluocinonide ointment (LIDEX) 0.05 % Apply topically 4 (four) times daily. 12/02/14  Yes Thurnell Lose, MD  hydrocerin (EUCERIN) CREA Apply 1 application topically 4 (four) times daily. 12/02/14  Yes Thurnell Lose, MD  insulin aspart (NOVOLOG) 100 UNIT/ML injection Before each meal 3 times a day, 140-199 - 2 units, 200-250 - 4 units, 251-299 - 6 units,  300-349 - 8 units,  350 or above 10 units. 12/02/14  Yes Thurnell Lose, MD  Insulin Glargine (LANTUS SOLOSTAR) 100 UNIT/ML Solostar Pen Inject 20 Units into the skin 2 (two) times daily. 12/02/14  Yes Thurnell Lose, MD  levocetirizine Harlow Ohms) 5  MG tablet Take 5 mg by mouth every evening.   Yes Historical Provider, MD  metoprolol tartrate (LOPRESSOR) 50 MG tablet Take 1 tablet (50 mg total) by mouth 2 (two) times daily. 12/02/14  Yes Thurnell Lose, MD  Nutritional Supplements (FEEDING SUPPLEMENT, NEPRO CARB STEADY,) LIQD Take 237 mLs by mouth 2 (two) times daily between meals. 11/09/14  Yes Kelvin Cellar, MD  ondansetron (ZOFRAN) 4 MG tablet Take 4 mg by mouth 3 (three) times daily as needed for nausea or vomiting.   Yes Historical Provider, MD  pantoprazole (PROTONIX) 20 MG  tablet Take 1 tablet (20 mg total) by mouth 2 (two) times daily. 12/02/14  Yes Thurnell Lose, MD  phenytoin (DILANTIN) 300 MG ER capsule Take 1 capsule (300 mg total) by mouth at bedtime. Patient taking differently: Take 300 mg by mouth every morning.  12/02/14  Yes Thurnell Lose, MD  saccharomyces boulardii (FLORASTOR) 250 MG capsule Take 1 capsule (250 mg total) by mouth 2 (two) times daily. Patient taking differently: Take 250 mg by mouth daily.  11/09/14  Yes Kelvin Cellar, MD  sevelamer carbonate (RENVELA) 800 MG tablet Take 2 tablets (1,600 mg total) by mouth 3 (three) times daily with meals. 12/02/14  Yes Thurnell Lose, MD  warfarin (COUMADIN) 5 MG tablet Take 1 tablet (5 mg total) by mouth daily. 03/03/14  Yes Donne Hazel, MD  enoxaparin (LOVENOX) 150 MG/ML injection Inject 0.55 mLs (85 mg total) into the skin daily. Stopped once INR reaches 2. 12/02/14   Thurnell Lose, MD     Family History  Problem Relation Age of Onset  . Heart disease Mother     before age 64  . Diabetes Father   . Diabetes Sister   . Peripheral vascular disease Sister     amputation  . Cancer Neg Hx   . Stroke Neg Hx     Social History   Social History  . Marital Status: Married    Spouse Name: N/A  . Number of Children: N/A  . Years of Education: N/A   Social History Main Topics  . Smoking status: Former Smoker -- 0.01 packs/day for 5 years    Types: Cigarettes    Quit date: 04/20/1994  . Smokeless tobacco: Never Used     Comment: some in college  . Alcohol Use: No  . Drug Use: No  . Sexual Activity: Yes   Other Topics Concern  . None   Social History Narrative     Review of Systems: A 12 point ROS discussed and pertinent positives are indicated in the HPI above.  All other systems are negative.  Review of Systems  Constitutional: Positive for activity change and fatigue. Negative for fever.  Respiratory: Negative for shortness of breath.   Neurological: Positive for  weakness.  Psychiatric/Behavioral: Positive for confusion and decreased concentration. Negative for behavioral problems.    Vital Signs: BP 111/82 mmHg  Pulse 90  Temp(Src) 98 F (36.7 C) (Tympanic)  Resp 19  Ht 6' (1.829 m)  Wt 185 lb 10 oz (84.2 kg)  BMI 25.17 kg/m2  SpO2 94%  Physical Exam  Cardiovascular: Normal rate, regular rhythm and normal heart sounds.   No murmur heard. Pulmonary/Chest: Effort normal and breath sounds normal. He has no wheezes.  Abdominal: Soft. Bowel sounds are normal. He exhibits no distension.  Musculoskeletal: Normal range of motion.  Neurological: He is alert.  Does follow commands Cannot answer time/place Does know name/dob  Skin:  Skin is warm and dry.  Psychiatric:  Consented wife over phone  Nursing note and vitals reviewed.   Mallampati Score:  MD Evaluation Airway: WNL Heart: WNL Abdomen: WNL Chest/ Lungs: WNL ASA  Classification: 3 Mallampati/Airway Score: One  Imaging: Ct Abdomen Pelvis Wo Contrast  12/10/2014   CLINICAL DATA:  Hematemesis. Fever. Altered mental status. Recent hospitalization for acute encephalopathy, hospital acquired pneumonia and positive c. diff PCR.  EXAM: CT CHEST, ABDOMEN AND PELVIS WITHOUT CONTRAST  TECHNIQUE: Multidetector CT imaging of the chest, abdomen and pelvis was performed following the standard protocol without IV contrast.  COMPARISON:  CT 11/27/2014  FINDINGS: CT CHEST FINDINGS  New from prior exam confluent and ground-glass airspace opacity in the left lower lobe, with air bronchograms. More diffuse ground-glass opacity throughout all lobes of both lungs, also new from prior. The tree in bud opacities in the right upper lobe are similar. Small right pleural effusion, unchanged from prior exam. There is a diminutive left pleural effusion that is new. No pericardial effusion.  The heart size is normal. Coronary artery calcifications are seen. Progressive mediastinal adenopathy, for example left lower  paratracheal lymph node measures 1.7 cm in short axis dimension. There prominent mildly enlarged upper paratracheal and prevascular lymph nodes. Limited assessment for hilar adenopathy given lack of contrast. Thoracic aorta is normal in caliber with mild atherosclerosis.  CT ABDOMEN AND PELVIS FINDINGS  Small volume of perihepatic ascites, diminished in volume from prior exam. The liver appears prominent in size measuring 19.2 cm craniocaudal dimension. No focal hepatic lesion allowing for lack contrast. The gallbladder is decompressed with gallbladder wall thickening. The spleen is normal in size. The adrenal glands are normal. Mild bilateral renal parenchymal atrophy, no hydronephrosis.  Questionable minimal haziness about the head of the pancreas. No peripancreatic fluid collection.  The stomach is decompressed. No small bowel thickening. Fluid within the cecum and proximal ascending colon without colonic wall thickening. Remainder the colon is decompressed.  No retroperitoneal adenopathy. Abdominal aorta is normal in caliber. Moderate atherosclerosis without aneurysm.  Within the pelvis there is a small volume of ascites. Bladder is minimally distended with equivocal bladder wall thickening. Prostate gland is normal in size. Atherosclerosis noted of the inguinal vasculature. Small fat containing umbilical hernia. Soft tissue densities in the anterior abdominal wall, may be related to injections.  Review of the osseous structures of the chest abdomen and pelvis demonstrates no acute osseous abnormality. No suspicious osseous lesion.  IMPRESSION: 1. Development of confluent airspace disease with air bronchograms in the left lower lobe. Additionally development of mild diffuse ground-glass opacity throughout all lobes of both lungs. This diffuse appearance may be infectious or inflammatory in etiology or represent pulmonary edema. Left lower lobe findings favor aspiration or pneumonia, which may be a separate  process than the more diffuse ground-glass opacities. 2. Small right pleural effusion, not significantly changed. Development of diminutive left pleural effusion that may be reactive. New mediastinal adenopathy which is likely reactive. 3. Persistent but decreased perihepatic ascites. Liver appears prominent in size. Decompressed gallbladder with gallbladder wall thickening. Constellation of findings suggests chronic liver disease. 4. Questionable edema about the head of the pancreas. This may be related to third spacing/ascites versus pancreatic inflammation. 5. Minimal fluid in the cecum and ascending colon without associated colonic wall thickening.   Electronically Signed   By: Jeb Levering M.D.   On: 12/10/2014 01:58   Ct Abdomen Pelvis Wo Contrast  11/27/2014   CLINICAL DATA:  59 year old male  with C difficile, elevated white count, altered mental status/encephalitis and diabetic ketoacidosis. Elevated LFTs and end-stage renal disease.  EXAM: CT CHEST, ABDOMEN AND PELVIS WITHOUT CONTRAST  TECHNIQUE: Multidetector CT imaging of the chest, abdomen and pelvis was performed following the standard protocol without IV contrast.  COMPARISON:  11/18/2014 and prior chest radiographs. 11/06/2014 abdominal ultrasound.  FINDINGS: CT CHEST FINDINGS  Mediastinum/Nodes: Mild pulmonary and moderate-heavy coronary artery calcifications noted. There is no evidence of thoracic aortic aneurysm or pericardial effusion. Shotty mediastinal lymph nodes are identified.  Lungs/Pleura: A small right pleural effusion is noted. Mild basilar atelectasis identified. Mild interstitial opacities within bilateral upper lobes noted, some with a tree-in-bud type appearance-question infection. There is no definite airspace disease, consolidation, suspicious nodule, mass or endobronchial/ endotracheal lesion.  Musculoskeletal: No acute or suspicious abnormalities.  CT ABDOMEN AND PELVIS FINDINGS  Please note that parenchymal abnormalities  may be missed without intravenous contrast.  Hepatobiliary: The liver and gallbladder are unremarkable. There is no evidence of biliary dilatation.  Pancreas: Unremarkable  Spleen: Unremarkable  Adrenals/Urinary Tract: Mild bilateral renal atrophy noted. The adrenal glands and bladder are unremarkable.  Stomach/Bowel: There is no evidence of bowel obstruction or are definite bowel wall thickening. The appendix is normal.  Vascular/Lymphatic: No enlarged lymph nodes or abdominal aortic aneurysm. Mild aortic atherosclerotic calcifications noted.  Reproductive: Prostate unremarkable.  Other: A small amount of ascites within the abdomen and pelvis noted. There is no evidence of pneumoperitoneum. A small umbilical hernia containing fat is identified. Mild subcutaneous edema is present.  Musculoskeletal: No acute or suspicious abnormalities.  IMPRESSION: Mild bilateral upper lobe interstitial opacities some with a tree-in-bud type appearance which may represent infection.  Small right pleural effusion, small amount of ascites and mild subcutaneous edema.  No evidence of bowel wall thickening.   Electronically Signed   By: Margarette Canada M.D.   On: 11/27/2014 15:22   Dg Chest 2 View  12/09/2014   CLINICAL DATA:  Altered mental status. Weakness. Shortness of breath. Rash on the back of the trauma for 1 day. Fever and cough. History of hypertension and diabetes. Nonsmoker.  EXAM: CHEST  2 VIEW  COMPARISON:  12/01/2014  FINDINGS: Focal airspace infiltration in the left lower lung posteriorly with possible blunting of the left costophrenic angle. Changes may represent focal pneumonia. Borderline heart size without significant vascular congestion. No pneumothorax. Mediastinal contours appear intact. Degenerative changes in the shoulders and spine.  IMPRESSION: Focal infiltration in the left lung base with blunting of left costophrenic angle likely due to pneumonia.   Electronically Signed   By: Lucienne Capers M.D.   On:  12/09/2014 22:08   Dg Chest 2 View  11/18/2014   CLINICAL DATA:  Altered mental status. Shortness of breath and nausea today.  EXAM: CHEST  2 VIEW  COMPARISON:  11/05/2014  FINDINGS: Chronic cardiomegaly and vascular congestion, unchanged. Diminished right pleural effusion and basilar opacity from prior exam. No new confluent airspace disease or pneumothorax. No acute osseous abnormalities.  IMPRESSION: 1. Diminished right pleural effusion and right basilar opacity. 2. Stable chronic cardiomegaly and vascular congestion.   Electronically Signed   By: Jeb Levering M.D.   On: 11/18/2014 06:21   Ct Head Wo Contrast  12/10/2014   CLINICAL DATA:  Hematemesis. Altered mental status. Acute encephalopathy. Recent hospitalization for acute encephalopathy, complicated by hospital acquired pneumonia and positive C difficile.  EXAM: CT HEAD WITHOUT CONTRAST  TECHNIQUE: Contiguous axial images were obtained from the base of the  skull through the vertex without intravenous contrast.  COMPARISON:  MRI brain 11/26/2014.  CT head 11/18/2014.  FINDINGS: Mild diffuse cerebral atrophy. No ventricular dilatation. Old area of encephalomalacia in the left posterior occipital lobe. No mass effect or midline shift. No abnormal extra-axial fluid collections. Gray-white matter junctions are distinct. Basal cisterns are not effaced. No evidence of acute intracranial hemorrhage. No depressed skull fractures. Visualized paranasal sinuses and mastoid air cells are not opacified. Vascular calcifications.  IMPRESSION: No acute intracranial abnormalities. Focal encephalomalacia in the left posterior occipital lobe is unchanged since prior study.   Electronically Signed   By: Lucienne Capers M.D.   On: 12/10/2014 01:42   Ct Head Wo Contrast  11/18/2014   CLINICAL DATA:  Altered mental status.  EXAM: CT HEAD WITHOUT CONTRAST  TECHNIQUE: Contiguous axial images were obtained from the base of the skull through the vertex without  intravenous contrast.  COMPARISON:  None.  FINDINGS: No intracranial hemorrhage, mass effect, or midline shift. Small focal encephalomalacia in the left occipital lobe. No hydrocephalus. The basilar cisterns are patent. No evidence of territorial infarct. No intracranial fluid collection. Calvarium is intact. Included paranasal sinuses and mastoid air cells are well aerated.  IMPRESSION: 1.  No acute intracranial abnormality. 2. Small focal encephalomalacia in the left occipital lobe.   Electronically Signed   By: Jeb Levering M.D.   On: 11/18/2014 06:13   Ct Chest Wo Contrast  12/10/2014   CLINICAL DATA:  Hematemesis. Fever. Altered mental status. Recent hospitalization for acute encephalopathy, hospital acquired pneumonia and positive c. diff PCR.  EXAM: CT CHEST, ABDOMEN AND PELVIS WITHOUT CONTRAST  TECHNIQUE: Multidetector CT imaging of the chest, abdomen and pelvis was performed following the standard protocol without IV contrast.  COMPARISON:  CT 11/27/2014  FINDINGS: CT CHEST FINDINGS  New from prior exam confluent and ground-glass airspace opacity in the left lower lobe, with air bronchograms. More diffuse ground-glass opacity throughout all lobes of both lungs, also new from prior. The tree in bud opacities in the right upper lobe are similar. Small right pleural effusion, unchanged from prior exam. There is a diminutive left pleural effusion that is new. No pericardial effusion.  The heart size is normal. Coronary artery calcifications are seen. Progressive mediastinal adenopathy, for example left lower paratracheal lymph node measures 1.7 cm in short axis dimension. There prominent mildly enlarged upper paratracheal and prevascular lymph nodes. Limited assessment for hilar adenopathy given lack of contrast. Thoracic aorta is normal in caliber with mild atherosclerosis.  CT ABDOMEN AND PELVIS FINDINGS  Small volume of perihepatic ascites, diminished in volume from prior exam. The liver appears  prominent in size measuring 19.2 cm craniocaudal dimension. No focal hepatic lesion allowing for lack contrast. The gallbladder is decompressed with gallbladder wall thickening. The spleen is normal in size. The adrenal glands are normal. Mild bilateral renal parenchymal atrophy, no hydronephrosis.  Questionable minimal haziness about the head of the pancreas. No peripancreatic fluid collection.  The stomach is decompressed. No small bowel thickening. Fluid within the cecum and proximal ascending colon without colonic wall thickening. Remainder the colon is decompressed.  No retroperitoneal adenopathy. Abdominal aorta is normal in caliber. Moderate atherosclerosis without aneurysm.  Within the pelvis there is a small volume of ascites. Bladder is minimally distended with equivocal bladder wall thickening. Prostate gland is normal in size. Atherosclerosis noted of the inguinal vasculature. Small fat containing umbilical hernia. Soft tissue densities in the anterior abdominal wall, may be related to injections.  Review of the osseous structures of the chest abdomen and pelvis demonstrates no acute osseous abnormality. No suspicious osseous lesion.  IMPRESSION: 1. Development of confluent airspace disease with air bronchograms in the left lower lobe. Additionally development of mild diffuse ground-glass opacity throughout all lobes of both lungs. This diffuse appearance may be infectious or inflammatory in etiology or represent pulmonary edema. Left lower lobe findings favor aspiration or pneumonia, which may be a separate process than the more diffuse ground-glass opacities. 2. Small right pleural effusion, not significantly changed. Development of diminutive left pleural effusion that may be reactive. New mediastinal adenopathy which is likely reactive. 3. Persistent but decreased perihepatic ascites. Liver appears prominent in size. Decompressed gallbladder with gallbladder wall thickening. Constellation of findings  suggests chronic liver disease. 4. Questionable edema about the head of the pancreas. This may be related to third spacing/ascites versus pancreatic inflammation. 5. Minimal fluid in the cecum and ascending colon without associated colonic wall thickening.   Electronically Signed   By: Jeb Levering M.D.   On: 12/10/2014 01:58   Ct Chest Wo Contrast  11/27/2014   CLINICAL DATA:  59 year old male with C difficile, elevated white count, altered mental status/encephalitis and diabetic ketoacidosis. Elevated LFTs and end-stage renal disease.  EXAM: CT CHEST, ABDOMEN AND PELVIS WITHOUT CONTRAST  TECHNIQUE: Multidetector CT imaging of the chest, abdomen and pelvis was performed following the standard protocol without IV contrast.  COMPARISON:  11/18/2014 and prior chest radiographs. 11/06/2014 abdominal ultrasound.  FINDINGS: CT CHEST FINDINGS  Mediastinum/Nodes: Mild pulmonary and moderate-heavy coronary artery calcifications noted. There is no evidence of thoracic aortic aneurysm or pericardial effusion. Shotty mediastinal lymph nodes are identified.  Lungs/Pleura: A small right pleural effusion is noted. Mild basilar atelectasis identified. Mild interstitial opacities within bilateral upper lobes noted, some with a tree-in-bud type appearance-question infection. There is no definite airspace disease, consolidation, suspicious nodule, mass or endobronchial/ endotracheal lesion.  Musculoskeletal: No acute or suspicious abnormalities.  CT ABDOMEN AND PELVIS FINDINGS  Please note that parenchymal abnormalities may be missed without intravenous contrast.  Hepatobiliary: The liver and gallbladder are unremarkable. There is no evidence of biliary dilatation.  Pancreas: Unremarkable  Spleen: Unremarkable  Adrenals/Urinary Tract: Mild bilateral renal atrophy noted. The adrenal glands and bladder are unremarkable.  Stomach/Bowel: There is no evidence of bowel obstruction or are definite bowel wall thickening. The appendix  is normal.  Vascular/Lymphatic: No enlarged lymph nodes or abdominal aortic aneurysm. Mild aortic atherosclerotic calcifications noted.  Reproductive: Prostate unremarkable.  Other: A small amount of ascites within the abdomen and pelvis noted. There is no evidence of pneumoperitoneum. A small umbilical hernia containing fat is identified. Mild subcutaneous edema is present.  Musculoskeletal: No acute or suspicious abnormalities.  IMPRESSION: Mild bilateral upper lobe interstitial opacities some with a tree-in-bud type appearance which may represent infection.  Small right pleural effusion, small amount of ascites and mild subcutaneous edema.  No evidence of bowel wall thickening.   Electronically Signed   By: Margarette Canada M.D.   On: 11/27/2014 15:22   Mr Brain Wo Contrast  12/10/2014   CLINICAL DATA:  Initial evaluation for acute encephalopathy.  EXAM: MRI HEAD WITHOUT CONTRAST  TECHNIQUE: Multiplanar, multiecho pulse sequences of the brain and surrounding structures were obtained without intravenous contrast.  COMPARISON:  Prior CT from earlier the same day as well as previous MRI from 11/26/2014.  FINDINGS: Diffuse prominence of the CSF containing spaces is compatible with generalized cerebral atrophy. Patchy and confluent T2/FLAIR hyperintensity  within the periventricular and deep white matter both cerebral hemispheres again seen, most consistent with chronic small vessel ischemic disease. 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  No abnormal foci of restricted diffusion to suggest acute intracranial infarct. Gray-white matter differentiation maintained. Normal intravascular flow voids are preserved. No acute or chronic intracranial hemorrhage.  There is mildly hyperintense T2/FLAIR signal intensity involving the mesial temporal lobes/hippocampi bilaterally, similar relative to the initial MRI from 11/21/2014, but worsened relative to subsequent  MRI from 11/26/2014. There is question of trace associated restricted diffusion within knee right hippocampus (series 7, image 17). No other significant diffusion-weighted signal abnormality.  No mass lesion, midline shift, or mass effect. No hydrocephalus. No extra-axial fluid collection.  Craniocervical junction within normal limits. Pituitary gland normal.  No acute abnormality about the orbits.  Paranasal sinuses are clear. Mild scattered opacity present within the mastoid air cells bilaterally, slightly greater on the right. Inner ear structures grossly normal.  Bone marrow signal intensity within normal limits. Scalp soft tissues unremarkable.  IMPRESSION: 1. Persistent abnormal T2/FLAIR signal intensity involving the mesial temporal lobes/hippocampi bilaterally, with question of trace diffusion abnormality within the right hippocampus. While these findings appear overall improved relative to previous MRI from 11/21/2014, the T2/FLAIR signal abnormality appears slightly worsened relative to prior MRI from 11/26/2014 (although the diffusion changes are improved from that exam as well). 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 As before, herpes encephalitis could also have this appearance. 2. No other acute intracranial process. 3. 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   Electronically Signed   By: Jeannine Boga M.D.   On: 12/10/2014 05:50   Mr Brain Wo Contrast  11/26/2014   CLINICAL DATA:  Encephalitis.  Confusion.  EXAM: MRI HEAD WITHOUT CONTRAST  TECHNIQUE: Multiplanar, multiecho pulse sequences of the brain and surrounding structures were obtained without intravenous contrast.  COMPARISON:  MRI of the brain 11/21/2014.  FINDINGS: Restricted diffusion within the hippocampal structures bilaterally is somewhat less conspicuous than on the prior exam.  Diffuse T2 signal remains in the medial temporal lobes. There is increased swelling in the left medial temporal lobe compared to the prior exam. The right hippocampus is similar to the prior exam.  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 Scattered periventricular and subcortical T2 changes are otherwise stable. No other new areas of signal abnormality are evident.  Flow was present in the major intracranial arteries. The globes orbits are intact. The paranasal sinuses are clear. There is some fluid in the inferior mastoid air cells. No obstructing nasopharyngeal lesion is present. Skullbase is within normal limits. Midline structures are unremarkable.  IMPRESSION: 1. Diffusion abnormality in the medial temporal lobes and hippocampal structures is improving. 2. Swelling and T2 signal is more prominent in the left medial temporal lobe and hippocampus. This may be related to seizure activity given the abnormal EEG and negative lumbar puncture. 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   Electronically Signed   By: San Morelle M.D.   On: 11/26/2014 17:14   Mr Brain Wo Contrast  11/21/2014   CLINICAL DATA:  Altered mental status. Encephalopathy. Atrial fibrillation.  EXAM: MRI HEAD WITHOUT CONTRAST  TECHNIQUE: Multiplanar, multiecho pulse sequences of the brain and surrounding structures were obtained without intravenous contrast.  COMPARISON:  CT head without contrast from the same day.  FINDINGS: Restricted diffusion and increased T2 signal is present within the medial temporal lobes and hippocampal structures bilaterally. Inferior temporal lobes are intact.  Moderate generalized atrophy and diffuse periventricular T2 changes bilaterally are advanced for age. The ventricles are of normal size.  Flow is present in the major intracranial arteries. The globes and orbits are intact. The paranasal sinuses are clear. There is some fluid in the right mastoid air cells. No obstructing  nasopharyngeal lesion is evident.  IMPRESSION: Abnormal T2 signal and restricted diffusion within the medial temporal lobes and hippocampus bilaterally. 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 There is no hemorrhage or signal change along the undersurface the temporal lobes, but herpes encephalitis is also considered. Status epilepticus is also on the differential diagnosis.  Atrophy in T2 signal changes are advanced for age. This is nonspecific, but likely reflects the sequela of chronic microvascular ischemia.  These results will be called to the ordering clinician or representative by the Radiologist Assistant, and communication documented in the PACS or zVision Dashboard.   Electronically Signed   By: San Morelle M.D.   On: 11/21/2014 19:01   US Scrotum  12/12/2014   CLINICAL DATA:  End-stage renal disease. Pain. Acute encephalopathy and confusion.  EXAM: ULTRASOUND OF SCROTUM  TECHNIQUE: Complete ultrasound examination of the testicles, epididymis, and other scrotal structures was performed.  COMPARISON:  CT abdomen and pelvis 12/10/2014  FINDINGS: Right testicle  Measurements: 3.1 x 1.6 x 2.4 cm. No mass or microlithiasis visualized.  Left testicle  Measurements: 1.9 x 1.8 x 2.3 cm. Focal intra testicular calcification measuring 1.2 mm, likely benign. No mass identified.  Right epididymis:  Normal in size and appearance.  Left epididymis:  Normal in size and appearance.  Hydrocele:  None visualized.  Varicocele:  Borderline left scrotal varicoceles demonstrated.  Color flow Doppler images demonstrate normal homogeneous flow to both testes.  IMPRESSION: Focal testicular calcification on the left. No mass or parenchymal distortion in either testis. No evidence of epididymal orchitis. Borderline left scrotal varicoceles.   Electronically Signed   By: Lucienne Capers M.D.   On: 12/12/2014 20:27   Ir US Guide Vasc Access Right  11/26/2014   CLINICAL DATA:  Atrial fibrillation  EXAM:  RIGHT JUGULAR TUNNELED PICC LINE PLACEMENT WITH ULTRASOUND AND FLUOROSCOPIC GUIDANCE  FLUOROSCOPY TIME:  18 seconds in  PROCEDURE: The patient was advised of the possible risks andcomplications and agreed to undergo the procedure. The patient was then brought to the angiographic suite for the procedure.  The right neck was prepped with chlorhexidine, drapedin the usual sterile fashion using maximum barrier technique (cap and mask, sterile gown, sterile gloves, large sterile sheet, hand hygiene and cutaneous antisepsis) and infiltrated locally with 1% Lidocaine.  Ultrasound demonstrated patency of the right internal jugular vein, and this was documented with an image. Under real-time ultrasound guidance, this vein was accessed with a 21 gauge micropuncture needle and image documentation was performed. A 0.018 wire was introduced in to the vein. Over this, a 5 [place] double lumen Power tunneled PICC was advanced to the lower SVC/right atrial junction. The cuff was positioned in the subcutaneous tract. Fluoroscopy during the procedure and fluoro spot radiograph confirms appropriate catheter position. The catheter was flushed and covered with asterile dressing.  Catheter length: 21 cm  COMPLICATIONS: None  IMPRESSION: Successful right jugular tunneled power PICC line placement with ultrasound and fluoroscopic guidance. The catheter is ready for use.   Electronically Signed   By: Arnell Sieving  Hoss M.D.   On: 11/26/2014 16:15   Dg Chest Port 1 View  12/16/2014   CLINICAL DATA:  Pneumonia, persistent leukocytosis, history atrial fibrillation, hypertension, diabetes mellitus, CHF, former smoker, end-stage renal disease on dialysis  EXAM: PORTABLE CHEST - 1 VIEW  COMPARISON:  Portable exam 1429 hours compared 12/12/2014  FINDINGS: Enlargement of cardiac silhouette with vascular congestion.  Mediastinal contour stable.  BILATERAL perihilar and basilar infiltrates question pulmonary edema versus infection.  Probable bibasilar  effusions.  No pneumothorax.  IMPRESSION: Enlargement of cardiac silhouette with pulmonary vascular congestion.  Perihilar and basilar infiltrates with associated pleural effusions, question pulmonary edema versus pneumonia.   Electronically Signed   By: Lavonia Dana M.D.   On: 12/16/2014 14:55   Dg Chest Port 1 View  12/12/2014   CLINICAL DATA:  Health care associated pneumonia.  EXAM: PORTABLE CHEST - 1 VIEW  COMPARISON:  December 09, 2014.  FINDINGS: Stable cardiomediastinal silhouette. No pneumothorax is noted. Right lung is clear. Stable left basilar opacity is noted most consistent with pneumonia. No significant pleural effusion is noted. Bony thorax appears intact.  IMPRESSION: Stable left basilar opacity is noted most consistent with pneumonia. Continued radiographic follow-up is recommended.   Electronically Signed   By: Marijo Conception, M.D.   On: 12/12/2014 07:24   Dg Chest Port 1 View  12/01/2014   CLINICAL DATA:  58 year old male with history of cough.  EXAM: PORTABLE CHEST - 1 VIEW  COMPARISON:  Chest x-ray 03/2015.  FINDINGS: There is a right-sided internal jugular central venous catheter with tip terminating in the superior cavoatrial junction. Lung volumes are slightly low. Diffuse interstitial prominence and peribronchial cuffing. No confluent consolidative airspace disease. Cephalization of the pulmonary vasculature. Mild cardiomegaly. The patient is rotated to the right on today's exam, resulting in distortion of the mediastinal contours and reduced diagnostic sensitivity and specificity for mediastinal pathology. Atherosclerosis in the thoracic aorta.  IMPRESSION: 1. Support apparatus, as above. 2. Overall, the appearance the chest suggests mild congestive heart failure. The possibility of concurrent bronchitis is not excluded, but is unlikely unless there is a history of fever and worsening purulent sputum production. 3. Previously described right lower lobe infiltrate is less apparent,  suggesting that this was atelectatic on the prior study from 11/30/2014.   Electronically Signed   By: Vinnie Langton M.D.   On: 12/01/2014 09:57   Dg Chest Port 1 View  11/30/2014   CLINICAL DATA:  Cough, shortness of breath, history hypertension, diabetes mellitus, CHF, chronic kidney disease, atrial fibrillation  EXAM: PORTABLE CHEST - 1 VIEW  COMPARISON:  Portable exam 1238 hours compared to 11/18/2014  FINDINGS: RIGHT jugular central venous catheter with tip projecting over SVC near cavoatrial junction.  Upper normal heart size.  Mediastinal contours and pulmonary vascularity normal.  Infiltrate RIGHT lower lobe.  Remaining lungs clear.  No pleural effusion or pneumothorax.  IMPRESSION: RIGHT lower lobe infiltrate consistent with pneumonia.  No pneumothorax following central line placement.   Electronically Signed   By: Lavonia Dana M.D.   On: 11/30/2014 13:01   Ir Fluoro Guide Cv Midline Picc Right  11/26/2014   CLINICAL DATA:  Atrial fibrillation  EXAM: RIGHT JUGULAR TUNNELED PICC LINE PLACEMENT WITH ULTRASOUND AND FLUOROSCOPIC GUIDANCE  FLUOROSCOPY TIME:  18 seconds in  PROCEDURE: The patient was advised of the possible risks andcomplications and agreed to undergo the procedure. The patient was then brought to the angiographic suite for the procedure.  The right neck was  prepped with chlorhexidine, drapedin the usual sterile fashion using maximum barrier technique (cap and mask, sterile gown, sterile gloves, large sterile sheet, hand hygiene and cutaneous antisepsis) and infiltrated locally with 1% Lidocaine.  Ultrasound demonstrated patency of the right internal jugular vein, and this was documented with an image. Under real-time ultrasound guidance, this vein was accessed with a 21 gauge micropuncture needle and image documentation was performed. A 0.018 wire was introduced in to the vein. Over this, a 5 [place] double lumen Power tunneled PICC was advanced to the lower SVC/right atrial junction. The  cuff was positioned in the subcutaneous tract. Fluoroscopy during the procedure and fluoro spot radiograph confirms appropriate catheter position. The catheter was flushed and covered with asterile dressing.  Catheter length: 21 cm  COMPLICATIONS: None  IMPRESSION: Successful right jugular tunneled power PICC line placement with ultrasound and fluoroscopic guidance. The catheter is ready for use.   Electronically Signed   By: Marybelle Killings M.D.   On: 11/26/2014 16:15    Labs:  CBC:  Recent Labs  12/14/14 0459 12/16/14 0650 12/17/14 0314 12/17/14 0942  WBC 17.8* 17.5* 19.5* 20.6*  HGB 7.9* 8.6* 8.7* 8.7*  HCT 23.9* 25.5* 27.2* 25.8*  PLT 296 335 347 368    COAGS:  Recent Labs  02/27/14 1515 02/27/14 2305 02/28/14 0453 03/01/14 0425  12/14/14 0459 12/15/14 0316 12/16/14 0650 12/17/14 0314  INR  --   --  1.43 1.66*  < > 3.14* 3.94* 4.37* 2.42*  APTT 53* 80* 80* 110*  --   --   --   --   --   < > = values in this interval not displayed.  BMP:  Recent Labs  12/15/14 0316 12/16/14 0650 12/17/14 0314 12/17/14 0943  NA 129* 127* 126* 126*  K 3.9 3.6 4.0 3.5  CL 94* 89* 89* 89*  CO2 24 24 22 23   GLUCOSE 292* 131* 79 128*  BUN 30* 43* 56* 57*  CALCIUM 7.9* 8.0* 8.2* 8.2*  CREATININE 4.47* 5.90* 6.72* 6.84*  GFRNONAA 13* 9* 8* 8*  GFRAA 15* 11* 9* 9*    LIVER FUNCTION TESTS:  Recent Labs  11/25/14 0514  11/30/14 1300  12/09/14 2030 12/10/14 0440  12/15/14 0316 12/16/14 0650 12/17/14 0314 12/17/14 0943  BILITOT 1.2  --  1.2  --  1.3* 1.9*  --   --   --   --   --   AST 86*  --  34  --  70* 62*  --   --   --   --   --   ALT 119*  --  69*  --  55 55  --   --   --   --   --   ALKPHOS 191*  --  147*  --  280* 274*  --   --   --   --   --   PROT 5.5*  --  4.8*  --  5.7* 6.2*  --   --   --   --   --   ALBUMIN 2.8*  < > 2.4*  < > 1.9* 1.9*  < > 1.7* 1.5* 1.6* 1.6*  < > = values in this interval not displayed.  TUMOR MARKERS: No results for input(s): AFPTM, CEA,  CA199, CHROMGRNA in the last 8760 hours.  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 Consent signed and in chart.  Will check INR in am May be able to move forward with tunneled cath 8/30 or 8/31 Wbc high---recent steroid treatment afeb  Thank you for this interesting consult.  I greatly enjoyed meeting Joseph [name] and look forward to participating in their care.  A copy of this report was sent to the requesting provider on this date.  Signed: Dhana Totton A 12/17/2014, 10:37 AM   I spent a total of 40 Minutes    in face to face in clinical consultation, greater than 50% of which was counseling/coordinating care for temp plasma pheresis catheter

## 2014-12-17 NOTE — Procedures (Signed)
R IJ Trialysis 20cm placement No complication No blood loss. See complete dictation in [facility]

## 2014-12-17 NOTE — Progress Notes (Signed)
ANTICOAGULATION CONSULT NOTE - Follow Up Consult  Pharmacy Consult for Warfarin to Heparin Indication: atrial fibrillation  Allergies  Allergen Reactions  . Keflex [Cephalexin] Other (See Comments)    c-diff reaction  . Dilaudid [Hydromorphone] Nausea And Vomiting    Patient Measurements: Height: 6' (182.9 cm) Weight: 185 lb 10 oz (84.2 kg) IBW/kg (Calculated) : 77.6  Vital Signs: Temp: 97.4 F (36.3 C) (08/29 1336) Temp Source: Oral (08/29 1336) BP: 108/78 mmHg (08/29 1336) Pulse Rate: 101 (08/29 1336)  Labs:  Recent Labs  12/15/14 0316  12/16/14 0650 12/17/14 0314 12/17/14 0942 12/17/14 0943  HGB  --   < > 8.6* 8.7* 8.7*  --   HCT  --   --  25.5* 27.2* 25.8*  --   PLT  --   --  335 347 368  --   LABPROT 37.6*  --  40.6* 26.1*  --   --   INR 3.94*  --  4.37* 2.42*  --   --   CREATININE 4.47*  --  5.90* 6.72*  --  6.84*  < > = values in this interval not displayed.  Estimated Creatinine Clearance: 12.8 mL/min (by C-G formula based on Cr of 6.84).   Medications:  Scheduled:  . amiodarone  400 mg Oral Daily  . atorvastatin  40 mg Oral QHS  . calcitRIOL  1.75 mcg Oral Q M,W,F-HD  . colchicine  0.6 mg Oral Daily  . darbepoetin (ARANESP) injection - DIALYSIS  100 mcg Intravenous Q Mon-HD  . diltiazem  30 mg Oral 4 times per day  . feeding supplement (NEPRO CARB STEADY)  237 mL Oral BID BM  . fluocinonide ointment   Topical QID  . guaiFENesin  600 mg Oral BID  . insulin aspart  0-15 Units Subcutaneous TID WC  . insulin aspart  0-5 Units Subcutaneous QHS  . insulin glargine  8 Units Subcutaneous QHS  . metoprolol tartrate  75 mg Oral BID  . multivitamin  1 tablet Oral QHS  . phenytoin  300 mg Oral q morning - 10a  . saccharomyces boulardii  250 mg Oral BID  . sodium chloride  3 mL Intravenous Q12H  . vancomycin  250 mg Oral Q6H   Infusions:   PRN: acetaminophen, benzonatate, diphenhydrAMINE  Assessment: 59 YOM on warfarin for hx Afib.INR elevated on  admit, with hemoptysis, s/p Vit K 8/22. INR today = 2.42 Awaiting tunneled cath   Goal of Therapy:  INR 2-3 Monitor platelets by anticoagulation protocol: Yes   Plan:  Hold heparin until INR less than 2 Daily INR  Thank you Anette Guarneri, PharmD [phone]

## 2014-12-18 LAB — RENAL FUNCTION PANEL
ALBUMIN: 1.8 g/dL — AB (ref 3.5–5.0)
Anion gap: 13 (ref 5–15)
BUN: 36 mg/dL — AB (ref 6–20)
CO2: 24 mmol/L (ref 22–32)
Calcium: 8.2 mg/dL — ABNORMAL LOW (ref 8.9–10.3)
Chloride: 94 mmol/L — ABNORMAL LOW (ref 101–111)
Creatinine, Ser: 4.87 mg/dL — ABNORMAL HIGH (ref 0.61–1.24)
GFR calc Af Amer: 14 mL/min — ABNORMAL LOW (ref >60)
GFR, EST NON AFRICAN AMERICAN: 12 mL/min — AB (ref >60)
Glucose, Bld: 129 mg/dL — ABNORMAL HIGH (ref 65–99)
PHOSPHORUS: 4 mg/dL (ref 2.5–4.6)
POTASSIUM: 4.4 mmol/L (ref 3.5–5.1)
Sodium: 131 mmol/L — ABNORMAL LOW (ref 135–145)

## 2014-12-18 LAB — PROTIME-INR
INR: 1.47 (ref 0.00–1.49)
PROTHROMBIN TIME: 17.9 s — AB (ref 11.6–15.2)

## 2014-12-18 LAB — CBC
HEMATOCRIT: 27.3 % — AB (ref 39.0–52.0)
HEMOGLOBIN: 9 g/dL — AB (ref 13.0–17.0)
MCH: 30.9 pg (ref 26.0–34.0)
MCHC: 33 g/dL (ref 30.0–36.0)
MCV: 93.8 fL (ref 78.0–100.0)
Platelets: 387 10*3/uL (ref 150–400)
RBC: 2.91 MIL/uL — AB (ref 4.22–5.81)
RDW: 19.7 % — AB (ref 11.5–15.5)
WBC: 19.3 10*3/uL — AB (ref 4.0–10.5)

## 2014-12-18 LAB — GLUCOSE, CAPILLARY
GLUCOSE-CAPILLARY: 111 mg/dL — AB (ref 65–99)
GLUCOSE-CAPILLARY: 115 mg/dL — AB (ref 65–99)
GLUCOSE-CAPILLARY: 250 mg/dL — AB (ref 65–99)
GLUCOSE-CAPILLARY: 279 mg/dL — AB (ref 65–99)

## 2014-12-18 LAB — HEPARIN LEVEL (UNFRACTIONATED)

## 2014-12-18 MED ORDER — ANTICOAGULANT SODIUM CITRATE 4% (200MG/5ML) IV SOLN
5.0000 mL | Freq: Once | Status: DC
  Filled 2014-12-18 (×2): qty 250

## 2014-12-18 MED ORDER — ACETAMINOPHEN 325 MG PO TABS: 650.0000 mg | ORAL_TABLET | ORAL | Status: DC | PRN

## 2014-12-18 MED ORDER — SODIUM CHLORIDE 0.9 % IV SOLN
4.0000 g | Freq: Once | INTRAVENOUS | Status: DC
  Filled 2014-12-18 (×2): qty 40

## 2014-12-18 MED ORDER — WARFARIN - PHARMACIST DOSING INPATIENT
Freq: Every day | Status: DC
  Administered 2014-12-21 – 2014-12-23 (×2)
  Administered 2014-12-25: 1

## 2014-12-18 MED ORDER — ACD FORMULA A 0.73-2.45-2.2 GM/100ML VI SOLN
Status: AC
  Filled 2014-12-18: qty 500

## 2014-12-18 MED ORDER — HEPARIN (PORCINE) IN NACL 100-0.45 UNIT/ML-% IJ SOLN
1800.0000 [IU]/h | INTRAMUSCULAR | Status: DC
  Administered 2014-12-18: 1000 [IU]/h via INTRAVENOUS
  Administered 2014-12-19: 1500 [IU]/h via INTRAVENOUS
  Administered 2014-12-20 – 2014-12-21 (×2): 1800 [IU]/h via INTRAVENOUS
  Filled 2014-12-18 (×5): qty 250

## 2014-12-18 MED ORDER — ACD FORMULA A 0.73-2.45-2.2 GM/100ML VI SOLN
Status: AC
  Administered 2014-12-18: 500 mL via INTRAVENOUS
  Filled 2014-12-18: qty 500

## 2014-12-18 MED ORDER — ACD FORMULA A 0.73-2.45-2.2 GM/100ML VI SOLN
500.0000 mL | Status: DC
  Administered 2014-12-18: 500 mL via INTRAVENOUS
  Filled 2014-12-18: qty 500

## 2014-12-18 MED ORDER — DIPHENHYDRAMINE HCL 25 MG PO CAPS
25.0000 mg | ORAL_CAPSULE | Freq: Four times a day (QID) | ORAL | Status: DC | PRN
  Administered 2014-12-18: 25 mg via ORAL
  Filled 2014-12-18: qty 1

## 2014-12-18 MED ORDER — SODIUM CHLORIDE 0.9 % IV SOLN
INTRAVENOUS | Status: DC
  Administered 2014-12-18: 15:00:00 via INTRAVENOUS_CENTRAL
  Filled 2014-12-18 (×4): qty 200

## 2014-12-18 MED ORDER — WARFARIN SODIUM 5 MG PO TABS
5.0000 mg | ORAL_TABLET | Freq: Once | ORAL | Status: AC
  Administered 2014-12-18: 5 mg via ORAL
  Filled 2014-12-18 (×2): qty 1

## 2014-12-18 MED ORDER — CALCIUM CARBONATE ANTACID 500 MG PO CHEW
CHEWABLE_TABLET | ORAL | Status: AC
  Administered 2014-12-18: 400 mg via ORAL
  Filled 2014-12-18: qty 2

## 2014-12-18 NOTE — Progress Notes (Signed)
Physical Therapy Treatment Patient Details Name: Joseph [name] MRN: [id number] DOB: Jan 02, 1956 Today's Date: 12/18/2014    History of Present Illness 59 yo male readmission with fever cough HCAP, anemia, and MRI abnormal t2-flair involving temporal lobes/ hippocami bilaterally with question of trace diffusion abnormally within R hippocampus. MRI reveals remote L PCA infarct. Pt with recent d/c 11/30/14 PMH: CDIFF, limbic encephalitis, ESRD, DM, CHF, CKD, PNA, HTN, cardioversion, AV fistula L forearm, R heart catheterization    PT Comments    Pt progressing towards physical therapy goals. Was able to perform transfers and ambulation with +1 mod assist/+2 min assist, mainly for powering-up to full stand from low recliner chair. Pt perseverating on eating and drinking throughout session but overall did well. Will continue to follow.   Follow Up Recommendations  CIR     Equipment Recommendations  None recommended by PT    Recommendations for Other Services Rehab consult     Precautions / Restrictions Precautions Precautions: Fall Precaution Comments: oxygen needs, multiple lines/ leads Restrictions Weight Bearing Restrictions: No    Mobility  Bed Mobility               General bed mobility comments: Pt sitting up in recliner upon PT arrival.   Transfers Overall transfer level: Needs assistance Equipment used: Rolling walker (2 wheeled) Transfers: Sit to/from Stand Sit to Stand: Min assist;+2 physical assistance         General transfer comment: From recliner, pt requires heavy use of UE's and +2 min assist (+1 mod assist) to power-up to full standing. Pt was cued to scoot all the way to edge of chair and pull feet under him prior to initiating stand. Pt used rocking momentum to stand.   Ambulation/Gait Ambulation/Gait assistance: Min guard Ambulation Distance (Feet): 125 Feet Assistive device: Rolling walker (2 wheeled) Gait Pattern/deviations: Step-through  pattern;Decreased stride length;Trunk flexed Gait velocity: decreased Gait velocity interpretation: Below normal speed for age/gender General Gait Details: Mildly guarded gait with flexed posture, but generally steady.  1 seated rest break 1/2 way before walk back to room.    Stairs            Wheelchair Mobility    Modified Rankin (Stroke Patients Only)       Balance Overall balance assessment: Needs assistance Sitting-balance support: Feet supported;No upper extremity supported Sitting balance-Leahy Scale: Good     Standing balance support: No upper extremity supported Standing balance-Leahy Scale: Fair Standing balance comment: Statically                    Cognition Arousal/Alertness: Awake/alert Behavior During Therapy: WFL for tasks assessed/performed Overall Cognitive Status: Within Functional Limits for tasks assessed         Following Commands: Follows one step commands with increased time            Exercises General Exercises - Lower Extremity Long Arc Quad: 10 reps    General Comments        Pertinent Vitals/Pain Pain Assessment: No/denies pain    Home Living                      Prior Function            PT Goals (current goals can now be found in the care plan section) Acute Rehab PT Goals Patient Stated Goal: stronger then home PT Goal Formulation: With patient Time For Goal Achievement: 12/25/14 Potential to Achieve Goals: Good Progress towards PT  goals: Progressing toward goals    Frequency  Min 3X/week    PT Plan Current plan remains appropriate;Frequency needs to be updated    Co-evaluation             End of Session Equipment Utilized During Treatment: Gait belt;Oxygen Activity Tolerance: Patient limited by fatigue Patient left: in chair;with call bell/phone within reach     Time: 0945-1010 PT Time Calculation (min) (ACUTE ONLY): 25 min  Charges:  $Gait Training: 8-22 mins $Therapeutic  Activity: 8-22 mins                    G Codes:      Rolinda Roan 12/19/2014, 12:15 PM   Rolinda Roan, PT, DPT Acute Rehabilitation Services Pager: [phone]

## 2014-12-18 NOTE — Progress Notes (Addendum)
[facility] PROGRESS NOTE  Joseph [name]:[id number] DOB: 1956/01/04 DOA: 12/09/2014 PCP: Webb Silversmith, NP   Brief narrative 59 year old male with history of end-stage renal disease on dialysis (M, W, F), chronic A. fib on Coumadin, gout, type 2 diabetes mellitus with recent hospitalization for encephalopathy with MRI showing limbic encephalitis. During that time he had undergone LP which was unremarkable. He did respond to IV steroid initially which was discontinued as patient had C. difficile colitis. Patient brought to the ED as he was having persistent fever for one day associated with productive cough with some hemoptysis. He also reported having nausea and vomiting with diarrhea. On presentation patient was mildly hypotensive and received 1 L IV normal saline bolus in the ED following which his heart rate and blood pressure improved. Patient appeared to have some confusion and lethargy on presentation. Patient met criteria for sepsis and imaging showed left-sided pneumonia. Patient placed on empiric antibiotics and admitted to stepdown.  Assessment/Plan: Sepsis secondary to healthcare associated pneumonia Completed 7 day course of antibiotics on 8/28 . Blood culture negative. Hemoptysis on presentation resolved. H&H stable. Pulmonary signed off..  -Given persistent leukocytosis to be chest x-ray ordered. Does not show new infiltrate. Follow-up chest x-ray in 4 weeks to see for resolution.   Persistent leukocytosis Patient having off-and-on diarrhea. -Was treated with 2 weeks course of high-dose vancomycin during recent hospitalization. Repeat C. difficile negative. Discussed with Dr. Megan Salon on 8/29. Recommends to treat with oral vancomycin for one week. -Monitor WBC. Ordered blood culture. Check GI pathogen panel. -Holding off on plasma exchange for another day until making sure infection has cleared.  Acute encephalopathy Associated with underlying sepsis versus ongoing  encephalitis. Patient recently hospitalized for limbic encephalitis. MRI on admission showing persistent abnormal T2/FLAIR signal intensity involving mesial temporal lobes/hippocampi bilaterally which seems to have improved overall compared to MRI from 11/21/2014. -Patient remains confused off and on and occasionally hallucinating. Neurology following. EEG repeated and is unremarkable for acute seizures. Received IV Solu-Medrol for 5 days during recent hospitalization. LP was negative. Ultrasound of the scrotum to rule out testicular malignancy was negative. (As per neurology to rule out paraneoplastic encephalitis). -Neurology suggest this could be post infectious versus autoimmune encephalitis. Recommends starting plasma exchange daily . HD catheter placed on 8/29. Started plasma exchange on 8/30.     A. fib with RVR -After returning from dialysis on 8/22.  Required Cardizem and amiodarone drip. Now on by mouth amiodarone .  Off Cardizem drip  On metoprolol twice a day. Rate controlled. -Coumadin resumed. Echo from 08/2014 showed LVH with EF of 50-55%    ESRD on hemodialysis Renal following. Hemodialysis as scheduled  History of seizure Continue Dilantin.  Diabetes mellitus fsg improved after low-dose Lantus added.    Protein calorie malnutrition Continue supplements  Diet: Renal/heart healthy  DVT prophylaxis: coumadin    Code Status: Full code Family Communication: none at bedside. Updated wife on the phone Disposition Plan: Continue telemetry monitoring   Consultants:  Renal  Pulmonary  Neurology  Procedures:  MRI brain  Dialysis catheter placed on 8/29 for plasma exchange  Antibiotics:  IV vancomycin and Zosyn since 8/22-8/27  levaquin 8/27-8/28  HPI/Subjective: Patient seen and examined. Had 3 loose bowel movements yesterday. Stool for C. difficile negative. Afebrile.  Objective: Filed Vitals:   12/18/14 1435  BP: 115/86  Pulse: 87  Temp: 98.1 F  (36.7 C)  Resp: 22    Intake/Output Summary (Last 24 hours) at 12/18/14 1450 Last  data filed at 12/18/14 1442  Gross per 24 hour  Intake    578 ml  Output      0 ml  Net    578 ml   Filed Weights   12/16/14 0629 12/17/14 0927 12/18/14 0300  Weight: 83.87 kg (184 lb 14.4 oz) 84.2 kg (185 lb 10 oz) 85 kg (187 lb 6.3 oz)    Exam:   General:  no acute distress  HEENT:  most oral mucosa   Respiratory: clear to auscultation bilaterally  CVS: S1 and S2  irregularly irregular, no murmurs   GI: Soft, nondistended, nontender, bowel sounds present  Musculoskeletal: Warm, no edema, multiple pigmented skin lesions on the back ( present since prior hospitalization)  CNS: Alert and oriented 1-2, confused, nonfocal  Data Reviewed: Basic Metabolic Panel:  Recent Labs Lab 12/15/14 0316 12/16/14 0650 12/17/14 0314 12/17/14 0943 12/18/14 0719  NA 129* 127* 126* 126* 131*  K 3.9 3.6 4.0 3.5 4.4  CL 94* 89* 89* 89* 94*  CO2 '24 24 22 23 24  ' GLUCOSE 292* 131* 79 128* 129*  BUN 30* 43* 56* 57* 36*  CREATININE 4.47* 5.90* 6.72* 6.84* 4.87*  CALCIUM 7.9* 8.0* 8.2* 8.2* 8.2*  MG 2.2  --   --   --   --   PHOS 3.3 3.6 3.8 4.6 4.0   Liver Function Tests:  Recent Labs Lab 12/15/14 0316 12/16/14 0650 12/17/14 0314 12/17/14 0943 12/18/14 0719  ALBUMIN 1.7* 1.5* 1.6* 1.6* 1.8*   No results for input(s): LIPASE, AMYLASE in the last 168 hours. No results for input(s): AMMONIA in the last 168 hours. CBC:  Recent Labs Lab 12/14/14 0459 12/16/14 0650 12/17/14 0314 12/17/14 0942 12/18/14 0719  WBC 17.8* 17.5* 19.5* 20.6* 19.3*  HGB 7.9* 8.6* 8.7* 8.7* 9.0*  HCT 23.9* 25.5* 27.2* 25.8* 27.3*  MCV 92.3 93.4 90.7 91.5 93.8  PLT 296 335 347 368 387   Cardiac Enzymes: No results for input(s): CKTOTAL, CKMB, CKMBINDEX, TROPONINI in the last 168 hours. BNP (last 3 results)  Recent Labs  11/05/14 2205  BNP 3626.3*    ProBNP (last 3 results)  Recent Labs  02/20/14 1515  02/24/14 1145 11/15/14 1622  PROBNP 15468.0* 10410.0* >4940.0*    CBG:  Recent Labs Lab 12/17/14 1419 12/17/14 1653 12/17/14 2206 12/18/14 0743 12/18/14 1222  GLUCAP 96 104* 238* 111* 115*    Recent Results (from the past 240 hour(s))  Blood Culture (routine x 2)     Status: None   Collection Time: 12/09/14  8:20 PM  Result Value Ref Range Status   Specimen Description BLOOD RIGHT WRIST  Final   Special Requests BOTTLES DRAWN AEROBIC AND ANAEROBIC 5CC  Final   Culture NO GROWTH 5 DAYS  Final   Report Status 12/14/2014 FINAL  Final  Blood Culture (routine x 2)     Status: None   Collection Time: 12/09/14  8:25 PM  Result Value Ref Range Status   Specimen Description BLOOD RIGHT HAND  Final   Special Requests BOTTLES DRAWN AEROBIC AND ANAEROBIC 4CC  Final   Culture NO GROWTH 5 DAYS  Final   Report Status 12/14/2014 FINAL  Final  C difficile quick scan w PCR reflex     Status: Abnormal   Collection Time: 12/10/14  4:42 AM  Result Value Ref Range Status   C Diff antigen POSITIVE (A) NEGATIVE Final   C Diff toxin NEGATIVE NEGATIVE Final   C Diff interpretation   Final  C. difficile present, but toxin not detected. This indicates colonization. In most cases, this does not require treatment. If patient has signs and symptoms consistent with colitis, consider treatment.  C difficile quick scan w PCR reflex     Status: None   Collection Time: 12/17/14  7:46 PM  Result Value Ref Range Status   C Diff antigen NEGATIVE NEGATIVE Final   C Diff toxin NEGATIVE NEGATIVE Final   C Diff interpretation Negative for toxigenic C. difficile  Final     Studies: Ir Fluoro Guide Cv Line Right  12/17/2014   CLINICAL DATA:  Acute encephalopathy, needs access for pheresis.  EXAM: EXAM RIGHT IJ CATHETER PLACEMENT UNDER ULTRASOUND AND FLUOROSCOPIC GUIDANCE  TECHNIQUE: The procedure, risks (including but not limited to bleeding, infection, organ damage, pneumothorax), benefits, and alternatives  were explained to the spouse. Questions regarding the procedure were encouraged and answered. The spouse understands and consents to the procedure. Patency of the right IJ vein was confirmed with ultrasound with image documentation. An appropriate skin site was determined. Skin site was marked. Region was prepped using maximum barrier technique including cap and mask, sterile gown, sterile gloves, large sterile sheet, and Chlorhexidine as cutaneous antisepsis. The region was infiltrated locally with 1% lidocaine. Under real-time ultrasound guidance, the right IJ vein was accessed with a 19 gauge needle; the needle tip within the vein was confirmed with ultrasound image documentation. The needle exchanged over a guidewire for vascular dilator which allowed advancement of a 20 cm Trialysis catheter. This was positioned with the tip at the mid right atrium. Spot chest radiograph shows good positioning and no pneumothorax. Catheter was flushed and sutured externally with 0-Prolene sutures. Patient tolerated the procedure well.  FLUOROSCOPY TIME:  6 seconds, 2.4 mGy  COMPLICATIONS: COMPLICATIONS none  IMPRESSION: 1. Technically successful right IJ Trialysis catheter placement.   Electronically Signed   By: Lucrezia Europe M.D.   On: 12/17/2014 15:21   Ir US Guide Vasc Access Right  12/17/2014   CLINICAL DATA:  Acute encephalopathy, needs access for pheresis.  EXAM: EXAM RIGHT IJ CATHETER PLACEMENT UNDER ULTRASOUND AND FLUOROSCOPIC GUIDANCE  TECHNIQUE: The procedure, risks (including but not limited to bleeding, infection, organ damage, pneumothorax), benefits, and alternatives were explained to the spouse. Questions regarding the procedure were encouraged and answered. The spouse understands and consents to the procedure. Patency of the right IJ vein was confirmed with ultrasound with image documentation. An appropriate skin site was determined. Skin site was marked. Region was prepped using maximum barrier technique  including cap and mask, sterile gown, sterile gloves, large sterile sheet, and Chlorhexidine as cutaneous antisepsis. The region was infiltrated locally with 1% lidocaine. Under real-time ultrasound guidance, the right IJ vein was accessed with a 19 gauge needle; the needle tip within the vein was confirmed with ultrasound image documentation. The needle exchanged over a guidewire for vascular dilator which allowed advancement of a 20 cm Trialysis catheter. This was positioned with the tip at the mid right atrium. Spot chest radiograph shows good positioning and no pneumothorax. Catheter was flushed and sutured externally with 0-Prolene sutures. Patient tolerated the procedure well.  FLUOROSCOPY TIME:  6 seconds, 2.4 mGy  COMPLICATIONS: COMPLICATIONS none  IMPRESSION: 1. Technically successful right IJ Trialysis catheter placement.   Electronically Signed   By: Lucrezia Europe M.D.   On: 12/17/2014 15:21    Scheduled Meds: . therapeutic plasma exchange solution   Dialysis Q1 Hr x 4  . amiodarone  400 mg Oral  Daily  . anticoagulant sodium citrate  5 mL Intracatheter Once  . atorvastatin  40 mg Oral QHS  . calcitRIOL  1.75 mcg Oral Q M,W,F-HD  . calcium gluconate IVPB  4 g Intravenous Once  . colchicine  0.6 mg Oral Daily  . darbepoetin (ARANESP) injection - DIALYSIS  100 mcg Intravenous Q Mon-HD  . diltiazem  30 mg Oral 4 times per day  . feeding supplement (NEPRO CARB STEADY)  237 mL Oral BID BM  . fluocinonide ointment   Topical QID  . guaiFENesin  600 mg Oral BID  . insulin aspart  0-15 Units Subcutaneous TID WC  . insulin aspart  0-5 Units Subcutaneous QHS  . insulin glargine  8 Units Subcutaneous QHS  . metoprolol tartrate  75 mg Oral BID  . multivitamin  1 tablet Oral QHS  . phenytoin  300 mg Oral q morning - 10a  . saccharomyces boulardii  250 mg Oral BID  . sodium chloride  3 mL Intravenous Q12H  . vancomycin  250 mg Oral Q6H   Continuous Infusions: . citrate dextrose    . heparin 1,000  Units/hr (12/18/14 1026)      Time spent: 25 minutes    Feige Lowdermilk, [place]  [facility] Pager [phone]. If 7PM-7AM, please contact night-coverage at [email or web address], password [facility] 12/18/2014, 2:50 PM  LOS: 9 days

## 2014-12-18 NOTE — Progress Notes (Signed)
TPE completed with no complications. Report given to primary RN. Tele notified. Pt. Alert, vss and returned safely to room.

## 2014-12-18 NOTE — Progress Notes (Signed)
ANTICOAGULATION CONSULT NOTE - Follow Up Consult  Pharmacy Consult for Heparin / Coumadin Indication: atrial fibrillation  Allergies  Allergen Reactions  . Keflex [Cephalexin] Other (See Comments)    c-diff reaction  . Dilaudid [Hydromorphone] Nausea And Vomiting   Assessment: 59 YOM on warfarin for hx Afib.INR elevated on admit, with hemoptysis, s/p Vit K 8/22. INR today = 1.47, starting heparin Plasmapheresis planned  Evening heparin level is SUBtherapeutic.   Goal of Therapy:  Heparin level = 0.3 to 0.7 Monitor platelets by anticoagulation protocol: Yes   Plan:  -Increase heparin to 1250 units/hr -Recheck level with AM labs -Daily HL, CBC -Monitor s/sx bleeding   Patient Measurements: Height: 6' (182.9 cm) Weight: 187 lb 6.3 oz (85 kg) IBW/kg (Calculated) : 77.6  Vital Signs: Temp: 97.6 F (36.4 C) (08/30 1623) Temp Source: Oral (08/30 1623) BP: 118/65 mmHg (08/30 1623) Pulse Rate: 87 (08/30 1623)  Labs:  Recent Labs  12/16/14 0650 12/17/14 0314 12/17/14 0942 12/17/14 0943 12/18/14 0719 12/18/14 1855  HGB 8.6* 8.7* 8.7*  --  9.0*  --   HCT 25.5* 27.2* 25.8*  --  27.3*  --   PLT 335 347 368  --  387  --   LABPROT 40.6* 26.1*  --   --  17.9*  --   INR 4.37* 2.42*  --   --  1.47  --   HEPARINUNFRC  --   --   --   --   --  <0.10*  CREATININE 5.90* 6.72*  --  6.84* 4.87*  --     Estimated Creatinine Clearance: 17.9 mL/min (by C-G formula based on Cr of 4.87).   Medications:  Scheduled:  . amiodarone  400 mg Oral Daily  . atorvastatin  40 mg Oral QHS  . calcitRIOL  1.75 mcg Oral Q M,W,F-HD  . citrate dextrose      . colchicine  0.6 mg Oral Daily  . darbepoetin (ARANESP) injection - DIALYSIS  100 mcg Intravenous Q Mon-HD  . diltiazem  30 mg Oral 4 times per day  . feeding supplement (NEPRO CARB STEADY)  237 mL Oral BID BM  . fluocinonide ointment   Topical QID  . guaiFENesin  600 mg Oral BID  . insulin aspart  0-15 Units Subcutaneous TID WC  .  insulin aspart  0-5 Units Subcutaneous QHS  . insulin glargine  8 Units Subcutaneous QHS  . metoprolol tartrate  75 mg Oral BID  . multivitamin  1 tablet Oral QHS  . phenytoin  300 mg Oral q morning - 10a  . saccharomyces boulardii  250 mg Oral BID  . sodium chloride  3 mL Intravenous Q12H  . vancomycin  250 mg Oral Q6H  . Warfarin - Pharmacist Dosing Inpatient   Does not apply q1800   Infusions:  . heparin 1,000 Units/hr (12/18/14 1026)   PRN: benzonatate    Hughes Better, PharmD, BCPS Clinical Pharmacist Pager: [phone] 12/18/2014 8:43 PM

## 2014-12-18 NOTE — Procedures (Addendum)
     CNS: Alert and oriented 1-2, confused, nonfocal       I have seen and examined this patient and agree with the plan of care . Patient is currently receiving a treatment of pheresis  Acute encephalopathy receiving plasma exchange  CVS   RRR RS  Clear AS  Soft NT ND Ext no edema  A/P  ESRD   Plan dialysis in AM HTN/Vol  Controlled Anemia   Stable  Plan dialysis is AM   [facility] W 12/18/2014, 4:18 PM

## 2014-12-18 NOTE — Progress Notes (Addendum)
ANTICOAGULATION CONSULT NOTE - Follow Up Consult  Pharmacy Consult for Heparin / Coumadin Indication: atrial fibrillation  Allergies  Allergen Reactions  . Keflex [Cephalexin] Other (See Comments)    c-diff reaction  . Dilaudid [Hydromorphone] Nausea And Vomiting   Assessment: 59 YOM on warfarin for hx Afib.INR elevated on admit, with hemoptysis, s/p Vit K 8/22. INR today = 1.47, starting heparin Plasmapheresis planned   Goal of Therapy:  Heparin level = 0.3 to 0.7 Monitor platelets by anticoagulation protocol: Yes   Plan:  Start heparin at 1000 units / hr 8 hr heparin level Daily labs  Restarting Coumadin tonight - 5 mg po x 1 tonight DC po vancomycin?      Patient Measurements: Height: 6' (182.9 cm) Weight: 187 lb 6.3 oz (85 kg) IBW/kg (Calculated) : 77.6  Vital Signs: Temp: 98 F (36.7 C) (08/30 0548) Temp Source: Tympanic (08/29 2238) BP: 122/86 mmHg (08/30 0548) Pulse Rate: 79 (08/30 0548)  Labs:  Recent Labs  12/16/14 0650 12/17/14 0314 12/17/14 0942 12/17/14 0943 12/18/14 0719  HGB 8.6* 8.7* 8.7*  --  9.0*  HCT 25.5* 27.2* 25.8*  --  27.3*  PLT 335 347 368  --  387  LABPROT 40.6* 26.1*  --   --  17.9*  INR 4.37* 2.42*  --   --  1.47  CREATININE 5.90* 6.72*  --  6.84* 4.87*    Estimated Creatinine Clearance: 17.9 mL/min (by C-G formula based on Cr of 4.87).   Medications:  Scheduled:  . amiodarone  400 mg Oral Daily  . atorvastatin  40 mg Oral QHS  . calcitRIOL  1.75 mcg Oral Q M,W,F-HD  . colchicine  0.6 mg Oral Daily  . darbepoetin (ARANESP) injection - DIALYSIS  100 mcg Intravenous Q Mon-HD  . diltiazem  30 mg Oral 4 times per day  . feeding supplement (NEPRO CARB STEADY)  237 mL Oral BID BM  . fluocinonide ointment   Topical QID  . guaiFENesin  600 mg Oral BID  . insulin aspart  0-15 Units Subcutaneous TID WC  . insulin aspart  0-5 Units Subcutaneous QHS  . insulin glargine  8 Units Subcutaneous QHS  . metoprolol tartrate  75 mg  Oral BID  . multivitamin  1 tablet Oral QHS  . phenytoin  300 mg Oral q morning - 10a  . saccharomyces boulardii  250 mg Oral BID  . sodium chloride  3 mL Intravenous Q12H  . vancomycin  250 mg Oral Q6H   Infusions:  . heparin     PRN: acetaminophen, benzonatate, diphenhydrAMINE   Thank you Anette Guarneri, PharmD [phone]

## 2014-12-18 NOTE — Progress Notes (Signed)
Initial Nutrition Assessment  DOCUMENTATION CODES:   Not applicable  INTERVENTION:   -Continue Nepro Shake po BID, each supplement provides 425 kcal and 19 grams protein  NUTRITION DIAGNOSIS:   Increased nutrient needs related to chronic illness as evidenced by estimated needs.  GOAL:   Patient will meet greater than or equal to 90% of their needs  MONITOR:   PO intake, Supplement acceptance, I & O's, Labs, Weight trends, Skin  REASON FOR ASSESSMENT:   Malnutrition Screening Tool    ASSESSMENT:   59 year old male with history of end-stage renal disease on dialysis (M, W, F), chronic A. fib on Coumadin, gout, type 2 diabetes mellitus with recent hospitalization for encephalopathy with MRI showing limbic encephalitis. During that time he had undergone LP which was unremarkable. He did respond to IV steroid initially which was discontinued as patient had C. difficile colitis. Patient brought to the ED as he was having persistent fever for one day associated with productive cough with some hemoptysis. He also reported having nausea and vomiting with diarrhea. On presentation patient was mildly hypotensive and received 1 L IV normal saline bolus in the ED following which his heart rate and blood pressure improved. Patient appeared to have some confusion and lethargy on presentation.  Pt admitted with severe sepsis related to HCAP.   Pt off unit at time of visit.   Pt s/p R IJ tiralysis line placement on 12/17/14; plasmapheresis has been held to ensure infection has cleared.   Reviewed RD note from previous admission on 11/25/14. Pt with hx of poor appetite and weight loss. Dry weight is 187#. Per previous assessment, wt loss is likely masked by edema. Unable to perform nutrition-focused physical exam at this time.  Per doc flowsheets, meal completion 75-100%. Pt is also receiving Nepro supplement BID; pt takes supplement about 50% of the time it is offered (noted multiple refusals  earlier  last week).   CSW following. Plan is CIR vs SNF.  Labs reviewed.   Diet Order:  Diet NPO time specified Except for: Sips with Meds Diet renal with fluid restriction Fluid restriction:: 1200 mL Fluid; Room service appropriate?: Yes; Fluid consistency:: Thin  Skin:  Reviewed, no issues  Last BM:  11/3014  Height:   Ht Readings from Last 1 Encounters:  12/11/14 6' (1.829 m)    Weight:   Wt Readings from Last 1 Encounters:  12/18/14 187 lb 6.3 oz (85 kg)    Ideal Body Weight:  80.9 kg  BMI:  Body mass index is 25.41 kg/(m^2).  Estimated Nutritional Needs:   Kcal:  2200-2400  Protein:  105-120 grams  Fluid:  per MD  EDUCATION NEEDS:   No education needs identified at this time  Zyliah Schier A. Jimmye Norman, RD, LDN, CDE Pager: [phone] After hours Pager: [phone]

## 2014-12-19 LAB — POCT I-STAT, CHEM 8
BUN: 22 mg/dL — ABNORMAL HIGH (ref 6–20)
CHLORIDE: 94 mmol/L — AB (ref 101–111)
CREATININE: 3.4 mg/dL — AB (ref 0.61–1.24)
Calcium, Ion: 1 mmol/L — ABNORMAL LOW (ref 1.12–1.23)
GLUCOSE: 257 mg/dL — AB (ref 65–99)
HCT: 30 % — ABNORMAL LOW (ref 39.0–52.0)
Hemoglobin: 10.2 g/dL — ABNORMAL LOW (ref 13.0–17.0)
POTASSIUM: 3.1 mmol/L — AB (ref 3.5–5.1)
Sodium: 137 mmol/L (ref 135–145)
TCO2: 25 mmol/L (ref 0–100)

## 2014-12-19 LAB — CBC
HCT: 27.1 % — ABNORMAL LOW (ref 39.0–52.0)
HCT: 26.2 % — ABNORMAL LOW (ref 39.0–52.0)
HEMOGLOBIN: 8.7 g/dL — AB (ref 13.0–17.0)
HEMOGLOBIN: 8.5 g/dL — AB (ref 13.0–17.0)
MCH: 29.8 pg (ref 26.0–34.0)
MCH: 29.9 pg (ref 26.0–34.0)
MCHC: 32.1 g/dL (ref 30.0–36.0)
MCHC: 32.4 g/dL (ref 30.0–36.0)
MCV: 92.8 fL (ref 78.0–100.0)
MCV: 92.3 fL (ref 78.0–100.0)
PLATELETS: 392 10*3/uL (ref 150–400)
PLATELETS: 373 10*3/uL (ref 150–400)
RBC: 2.92 MIL/uL — AB (ref 4.22–5.81)
RBC: 2.84 MIL/uL — ABNORMAL LOW (ref 4.22–5.81)
RDW: 19.9 % — ABNORMAL HIGH (ref 11.5–15.5)
RDW: 19.7 % — AB (ref 11.5–15.5)
WBC: 19.7 10*3/uL — AB (ref 4.0–10.5)
WBC: 19.5 10*3/uL — ABNORMAL HIGH (ref 4.0–10.5)

## 2014-12-19 LAB — RENAL FUNCTION PANEL
Albumin: 3.2 g/dL — ABNORMAL LOW (ref 3.5–5.0)
Albumin: 2.9 g/dL — ABNORMAL LOW (ref 3.5–5.0)
Anion gap: 12 (ref 5–15)
Anion gap: 10 (ref 5–15)
BUN: 43 mg/dL — AB (ref 6–20)
BUN: 44 mg/dL — AB (ref 6–20)
CALCIUM: 8.3 mg/dL — AB (ref 8.9–10.3)
CHLORIDE: 95 mmol/L — AB (ref 101–111)
CO2: 22 mmol/L (ref 22–32)
CO2: 21 mmol/L — AB (ref 22–32)
CREATININE: 5.78 mg/dL — AB (ref 0.61–1.24)
Calcium: 8.1 mg/dL — ABNORMAL LOW (ref 8.9–10.3)
Chloride: 99 mmol/L — ABNORMAL LOW (ref 101–111)
Creatinine, Ser: 5.37 mg/dL — ABNORMAL HIGH (ref 0.61–1.24)
GFR calc Af Amer: 12 mL/min — ABNORMAL LOW (ref >60)
GFR calc Af Amer: 11 mL/min — ABNORMAL LOW (ref >60)
GFR calc non Af Amer: 10 mL/min — ABNORMAL LOW (ref >60)
GFR, EST NON AFRICAN AMERICAN: 11 mL/min — AB (ref >60)
GLUCOSE: 187 mg/dL — AB (ref 65–99)
Glucose, Bld: 191 mg/dL — ABNORMAL HIGH (ref 65–99)
POTASSIUM: 4.3 mmol/L (ref 3.5–5.1)
Phosphorus: 4.3 mg/dL (ref 2.5–4.6)
Phosphorus: 4.1 mg/dL (ref 2.5–4.6)
Potassium: 4 mmol/L (ref 3.5–5.1)
SODIUM: 130 mmol/L — AB (ref 135–145)
Sodium: 129 mmol/L — ABNORMAL LOW (ref 135–145)

## 2014-12-19 LAB — HEPARIN LEVEL (UNFRACTIONATED): HEPARIN UNFRACTIONATED: 0.16 [IU]/mL — AB (ref 0.30–0.70)

## 2014-12-19 LAB — GLUCOSE, CAPILLARY
GLUCOSE-CAPILLARY: 199 mg/dL — AB (ref 65–99)
Glucose-Capillary: 225 mg/dL — ABNORMAL HIGH (ref 65–99)
Glucose-Capillary: 309 mg/dL — ABNORMAL HIGH (ref 65–99)

## 2014-12-19 LAB — PROTIME-INR
INR: 1.69 — ABNORMAL HIGH (ref 0.00–1.49)
Prothrombin Time: 19.9 seconds — ABNORMAL HIGH (ref 11.6–15.2)

## 2014-12-19 MED ORDER — CALCIUM CARBONATE ANTACID 500 MG PO CHEW
CHEWABLE_TABLET | ORAL | Status: AC
  Filled 2014-12-19: qty 2

## 2014-12-19 MED ORDER — ACD FORMULA A 0.73-2.45-2.2 GM/100ML VI SOLN
Status: AC
  Filled 2014-12-19: qty 500

## 2014-12-19 MED ORDER — LIDOCAINE HCL (PF) 1 % IJ SOLN: 5.0000 mL | INTRAMUSCULAR | Status: DC | PRN

## 2014-12-19 MED ORDER — PENTAFLUOROPROP-TETRAFLUOROETH EX AERO
1.0000 | INHALATION_SPRAY | CUTANEOUS | Status: DC | PRN
Start: 2014-12-19 — End: 2014-12-19

## 2014-12-19 MED ORDER — SODIUM CHLORIDE 0.9 % IV SOLN
INTRAVENOUS | Status: AC
  Administered 2014-12-19 (×4): via INTRAVENOUS_CENTRAL
  Filled 2014-12-19 (×4): qty 200

## 2014-12-19 MED ORDER — CALCIUM GLUCONATE 10 % IV SOLN
4.0000 g | Freq: Once | INTRAVENOUS | Status: AC
  Administered 2014-12-19: 4 g via INTRAVENOUS
  Filled 2014-12-19: qty 40

## 2014-12-19 MED ORDER — WARFARIN SODIUM 5 MG PO TABS
5.0000 mg | ORAL_TABLET | Freq: Once | ORAL | Status: AC
  Administered 2014-12-19: 5 mg via ORAL
  Filled 2014-12-19: qty 1

## 2014-12-19 MED ORDER — HEPARIN SODIUM (PORCINE) 1000 UNIT/ML IJ SOLN
1000.0000 [IU] | Freq: Once | INTRAMUSCULAR | Status: DC
  Filled 2014-12-19: qty 1

## 2014-12-19 MED ORDER — LIDOCAINE-PRILOCAINE 2.5-2.5 % EX CREA
1.0000 "application " | TOPICAL_CREAM | CUTANEOUS | Status: DC | PRN
  Filled 2014-12-19: qty 5

## 2014-12-19 MED ORDER — ALTEPLASE 2 MG IJ SOLR
2.0000 mg | Freq: Once | INTRAMUSCULAR | Status: DC | PRN
  Filled 2014-12-19: qty 2

## 2014-12-19 MED ORDER — DIPHENHYDRAMINE HCL 25 MG PO CAPS
25.0000 mg | ORAL_CAPSULE | Freq: Four times a day (QID) | ORAL | Status: DC | PRN
  Administered 2014-12-20: 25 mg via ORAL
  Filled 2014-12-19 (×3): qty 1

## 2014-12-19 MED ORDER — NEPRO/CARBSTEADY PO LIQD
237.0000 mL | ORAL | Status: DC | PRN
  Filled 2014-12-19: qty 237

## 2014-12-19 MED ORDER — ACD FORMULA A 0.73-2.45-2.2 GM/100ML VI SOLN
500.0000 mL | Status: DC
  Administered 2014-12-19: 500 mL via INTRAVENOUS
  Filled 2014-12-19: qty 500

## 2014-12-19 MED ORDER — SODIUM CHLORIDE 0.9 % IV SOLN: 100.0000 mL | INTRAVENOUS | Status: DC | PRN

## 2014-12-19 MED ORDER — HEPARIN SODIUM (PORCINE) 1000 UNIT/ML DIALYSIS: 1000.0000 [IU] | INTRAMUSCULAR | Status: DC | PRN

## 2014-12-19 MED ORDER — ACETAMINOPHEN 325 MG PO TABS: 650.0000 mg | ORAL_TABLET | ORAL | Status: DC | PRN

## 2014-12-19 MED ORDER — CALCIUM CARBONATE ANTACID 500 MG PO CHEW
2.0000 | CHEWABLE_TABLET | ORAL | Status: AC
  Administered 2014-12-18 – 2014-12-19 (×2): 400 mg via ORAL
  Filled 2014-12-19: qty 2

## 2014-12-19 NOTE — Progress Notes (Addendum)
ANTICOAGULATION CONSULT NOTE - Follow Up Consult  Pharmacy Consult for Heparin / Coumadin Indication: atrial fibrillation  Allergies  Allergen Reactions  . Keflex [Cephalexin] Other (See Comments)    c-diff reaction  . Dilaudid [Hydromorphone] Nausea And Vomiting    Assessment: 59 YOM on warfarin for hx Afib.INR elevated on admit, with hemoptysis, s/p Vit K 8/22. INR today = 1.69, continues on heparin and Coumadin Plasmapheresis CBC stable   Goal of Therapy:  Heparin level = 0.3 to 0.7 Monitor platelets by anticoagulation protocol: Yes   Plan:  Continue heparin at 1500 units / hr 8 hr heparin level at 1600 pm Repeat Coumadin 5 mg po x 1 tonight  DC po vancomycin? Daily labs     Patient Measurements: Height: 6' (182.9 cm) Weight: 187 lb 6.3 oz (85 kg) IBW/kg (Calculated) : 77.6  Vital Signs: Temp: 97.6 F (36.4 C) (08/31 0632) Temp Source: Oral (08/31 2355) BP: 105/67 mmHg (08/31 7322) Pulse Rate: 84 (08/31 0632)  Labs:  Recent Labs  12/17/14 0314 12/17/14 0254 12/17/14 0943 12/18/14 0719 12/18/14 1855 12/19/14 0555  HGB 8.7* 8.7*  --  9.0*  --  8.7*  HCT 27.2* 25.8*  --  27.3*  --  27.1*  PLT 347 368  --  387  --  392  LABPROT 26.1*  --   --  17.9*  --  19.9*  INR 2.42*  --   --  1.47  --  1.69*  HEPARINUNFRC  --   --   --   --  <0.10* <0.10*  CREATININE 6.72*  --  6.84* 4.87*  --  5.37*    Estimated Creatinine Clearance: 16.3 mL/min (by C-G formula based on Cr of 5.37).   Medications:  Scheduled:  . amiodarone  400 mg Oral Daily  . atorvastatin  40 mg Oral QHS  . calcitRIOL  1.75 mcg Oral Q M,W,F-HD  . colchicine  0.6 mg Oral Daily  . darbepoetin (ARANESP) injection - DIALYSIS  100 mcg Intravenous Q Mon-HD  . diltiazem  30 mg Oral 4 times per day  . feeding supplement (NEPRO CARB STEADY)  237 mL Oral BID BM  . fluocinonide ointment   Topical QID  . guaiFENesin  600 mg Oral BID  . insulin aspart  0-15 Units Subcutaneous TID WC  . insulin  aspart  0-5 Units Subcutaneous QHS  . insulin glargine  8 Units Subcutaneous QHS  . metoprolol tartrate  75 mg Oral BID  . multivitamin  1 tablet Oral QHS  . phenytoin  300 mg Oral q morning - 10a  . saccharomyces boulardii  250 mg Oral BID  . sodium chloride  3 mL Intravenous Q12H  . vancomycin  250 mg Oral Q6H  . Warfarin - Pharmacist Dosing Inpatient   Does not apply q1800   Infusions:  . heparin 1,500 Units/hr (12/19/14 0925)   PRN: benzonatate   Thank you Anette Guarneri, PharmD [phone]  _________________________________________________ ADDENDUM: Heparin level remains subtherapeutic at 0.16 after rate increase this morning.  No issues reported per RN.  Goal of Therapy:  Heparin level = 0.3 to 0.7 Monitor platelets by anticoagulation protocol: Yes   Plan:  - Increase heparin to 1750 units / hr - Check 8hr heparin level - Pt scheduled to receive 5mg  warfarin tonight - Monitor daily HL, INR, CBC, s/sx of bleeding  Drucie Opitz, PharmD Clinical Pharmacist Pager: [phone] 12/19/2014 5:08 PM

## 2014-12-19 NOTE — Progress Notes (Signed)
Physical Therapy Treatment Patient Details Name: Joseph [name] MRN: [id number] DOB: 11-29-1955 Today's Date: 12/19/2014    History of Present Illness 59 yo male readmission with fever cough HCAP, anemia, and MRI abnormal t2-flair involving temporal lobes/ hippocami bilaterally with question of trace diffusion abnormally within R hippocampus. MRI reveals remote L PCA infarct. Pt with recent d/c 11/30/14 PMH: CDIFF, limbic encephalitis, ESRD, DM, CHF, CKD, PNA, HTN, cardioversion, AV fistula L forearm, R heart catheterization    PT Comments    Limited amb distance as he is exhausted (NPO for a while); Still, he participated and tried well  Follow Up Recommendations  CIR     Equipment Recommendations  None recommended by PT    Recommendations for Other Services Rehab consult     Precautions / Restrictions Precautions Precautions: Fall Precaution Comments: oxygen needs, multiple lines/ leads Restrictions Weight Bearing Restrictions: No    Mobility  Bed Mobility                  Transfers Overall transfer level: Needs assistance Equipment used: Rolling walker (2 wheeled) Transfers: Sit to/from Stand Sit to Stand: Min assist;+2 physical assistance         General transfer comment: Cues for technique and to bend knees more to position feet closer to center of mass for more ease getting up; cues for hand placement; min assist to power up; with apparent less need for momentum  Ambulation/Gait Ambulation/Gait assistance: Min assist Ambulation Distance (Feet): 30 Feet Assistive device: Rolling walker (2 wheeled) Gait Pattern/deviations: Step-through pattern;Decreased stride length Gait velocity: decreased   General Gait Details: Mildly guarded gait with flexed posture, but generally steady.  Seated rest break as we prepared to exit room; Ultimately, pt is very fatigues after being NPO for quite a while; when it was time to get into the hallway, he was exhausted and  requested to return to the recliner   Stairs            Wheelchair Mobility    Modified Rankin (Stroke Patients Only)       Balance             Standing balance-Leahy Scale: Fair Standing balance comment: Washing hands at sink                    Cognition Arousal/Alertness: Awake/alert Behavior During Therapy: WFL for tasks assessed/performed Overall Cognitive Status: Within Functional Limits for tasks assessed                      Exercises      General Comments General comments (skin integrity, edema, etc.):  SATURATION QUALIFICATIONS: (This note is used to comply with regulatory documentation for home oxygen)  Patient Saturations on Room Air at Rest = 90%  Patient Saturations on Room Air while Ambulating = 85%  Patient Saturations on 2 Liters of oxygen while Ambulating = 94%  Please briefly explain why patient needs home oxygen: Patient requires supplemental oxygen to maintain oxygen saturations at acceptable, safe levels with physical activity.       Pertinent Vitals/Pain Pain Assessment: No/denies pain    Home Living                      Prior Function            PT Goals (current goals can now be found in the care plan section) Acute Rehab PT Goals Patient Stated Goal: stronger then home  PT Goal Formulation: With patient Time For Goal Achievement: 12/25/14 Potential to Achieve Goals: Good Progress towards PT goals: Not progressing toward goals - comment (exhausted, being NPO)    Frequency  Min 3X/week    PT Plan Current plan remains appropriate    Co-evaluation             End of Session Equipment Utilized During Treatment: Gait belt;Oxygen Activity Tolerance: Patient limited by fatigue Patient left: in chair;with call bell/phone within reach     Time: 0840-0900 (times are approximate) PT Time Calculation (min) (ACUTE ONLY): 20 min  Charges:  $Gait Training: 8-22 mins                    G  Codes:      Quin Hoop 12/19/2014, 9:20 AM  Joseph [name], [place] Pager [phone] Office [phone]

## 2014-12-19 NOTE — Procedures (Signed)
I have seen and examined this patient and agree with the plan of care . Patient receiving HD will challenge dry weight to help with lower extremity swelling and shortness of breath  Joseph [name] W 12/19/2014, 11:43 AM

## 2014-12-19 NOTE — Progress Notes (Signed)
Occupational Therapy Treatment Patient Details Name: Joseph [name] MRN: [id number] DOB: [birth year] Today's Date: 12/19/2014    History of present illness 59 yo male readmission with fever cough HCAP, anemia, and MRI abnormal t2-flair involving temporal lobes/ hippocami bilaterally with question of trace diffusion abnormally within R hippocampus. MRI reveals remote L PCA infarct. Pt with recent d/c 11/30/14 PMH: CDIFF, limbic encephalitis, ESRD, DM, CHF, CKD, PNA, HTN, cardioversion, AV fistula L forearm, R heart catheterization   OT comments  Patient progressing towards OT goals, continue plan of care for now. Pt eager and motivated to regain his independence. Pt is appropriate for CIR so he can safely discharge home. According to other notes in chart, pt and family are refusing SNF if CIR denies. If this is the case, will need to make sure patient has appropriate and needed education prior to discharge home and recommending [place] with 24/7 supervision/assistance at home.   HR as high as 120 during sit<>stand and transfer activity.   Follow Up Recommendations  CIR    Equipment Recommendations  Other (comment) (TBD)    Recommendations for Other Services  None at this time   Precautions / Restrictions Precautions Precautions: Fall Precaution Comments: oxygen needs, multiple lines/ leads Restrictions Weight Bearing Restrictions: No    Mobility Bed Mobility General bed mobility comments: Pt sitting up in recliner upon OT arrival.   Transfers Overall transfer level: Needs assistance Equipment used: None Transfers: Sit to/from Stand Sit to Stand: Mod assist Stand pivot transfers: Min assist       General transfer comment: Mod assist to lift/lower for sit<>stand transfer. Cues for hand placement and technique during all transfers.     Balance Overall balance assessment: Needs assistance Sitting-balance support: No upper extremity supported;Feet supported Sitting balance-Leahy Scale:  Good     Standing balance support: No upper extremity supported Standing balance-Leahy Scale: Poor Standing balance comment: standing statically   ADL Overall ADL's : Needs assistance/impaired General ADL Comments: Pt found seated in chair with request to use BR. Pt aprehensive about 1 person assisting, "can you do it by yourself?". Donned gait belt, and patient stood from recliner with mod assist, then performed pivot > BSC (to left side). Pt able to perform clothing mgmt & peri cleansing, then transferred back to recliner.      Cognition   Behavior During Therapy: WFL for tasks assessed/performed Overall Cognitive Status: Within Functional Limits for tasks assessed                 Pertinent Vitals/ Pain       Pain Assessment: No/denies pain   Frequency Min 3X/week     Progress Toward Goals  OT Goals(current goals can now befound in the care plan section)  Progress towards OT goals: Progressing toward goals     Plan Discharge plan remains appropriate    End of Session Equipment Utilized During Treatment: Gait belt;Oxygen   Activity Tolerance Patient tolerated treatment well   Patient Left in chair;with call bell/phone within reach;with chair alarm set;with nursing/sitter in room  Nurse Communication Mobility status     Time: 5631-4970 OT Time Calculation (min): 22 min  Charges: OT General Charges $OT Visit: 1 Procedure OT Treatments $Self Care/Home Management : 8-22 mins  Emiel Kielty , MS, OTR/L, CLT Pager: [phone]  12/19/2014, 3:09 PM

## 2014-12-19 NOTE — Progress Notes (Signed)
[facility] PROGRESS NOTE  Joseph [name] LOV:[id number] DOB: 1956-03-09 DOA: 12/09/2014 PCP: Webb Silversmith, NP  Assessment/Plan: Sepsis secondary to healthcare associated pneumonia -Completed 7 day course of antibiotics on 8/28 . -Blood culture negative. Hemoptysis on presentation resolved. H&H stable. Pulmonary signed off..  -Given persistent leukocytosis to be chest x-ray ordered. Does not show new infiltrate. -Follow-up chest x-ray in 4 weeks to see for resolution. -Stable  Persistent leukocytosis -Patient having off-and-on diarrhea. -Was treated with 2 weeks course of high-dose vancomycin during recent hospitalization. Repeat C. difficile negative. Dr. Clementeen Graham had spoken with Dr. Megan Salon on 8/29 who recommended to treat with oral vancomycin for one week. -Monitor WBC. Ordered blood culture. GI pathogen panel pending -Holding off on plasma exchange for another day until making sure infection has cleared.  Acute encephalopathy -Associated with underlying sepsis versus ongoing encephalitis. Patient was recently hospitalized for limbic encephalitis. MRI on admission showing persistent abnormal T2/FLAIR signal intensity involving mesial temporal lobes/hippocampi bilaterally which seems to have improved overall compared to MRI from 11/21/2014. -Patient remains confused off and on and occasionally hallucinating. EEG was repeated and is unremarkable for acute seizures. -Received IV Solu-Medrol for 5 days during recent hospitalization.  -LP was negative.  -Ultrasound of the scrotum to rule out testicular malignancy was negative. (As per neurology to rule out paraneoplastic encephalitis). -Neurology suggest this could be post infectious versus autoimmune encephalitis. -Recommendation for starting plasma exchange daily . HD catheter was placed on 8/29 and patient started plasma exchange on 8/30.  A. fib with RVR -After returning from dialysis on 8/22.  -Required Cardizem and amiodarone  drip. Now on by mouth amiodarone .Remains off Cardizem drip On metoprolol twice a day. Currently rate controlled. -Coumadin resumed. -Echo from 08/2014 showed LVH with EF of 50-55%  ESRD on hemodialysis -Renal following.  -Scheduled HD to be continued -Nephrology following  History of seizure -Continue Dilantin.  Diabetes mellitus -Glucose improved after low-dose Lantus added.  Protein calorie malnutrition -Continue supplements  Code Status: Full Family Communication: Pt in room (indicate person spoken with, relationship, and if by phone, the number) Disposition Plan: Pending   Consultants:  Nephrology  Procedures:    Antibiotics:   (indicate start date, and stop date if known)  HPI/Subjective: States feeling better but still confused  Objective: Filed Vitals:   12/19/14 1230 12/19/14 1300 12/19/14 1326 12/19/14 1448  BP: 115/70 99/77 114/69 107/67  Pulse: 83 81 106 76  Temp:   97.8 F (36.6 C)   TempSrc:      Resp:   16   Height:      Weight:   81.2 kg (179 lb 0.2 oz)   SpO2:   94%     Intake/Output Summary (Last 24 hours) at 12/19/14 1623 Last data filed at 12/19/14 1503  Gross per 24 hour  Intake    798 ml  Output   3543 ml  Net  -2745 ml   Filed Weights   12/18/14 0300 12/19/14 1023 12/19/14 1326  Weight: 85 kg (187 lb 6.3 oz) 84.5 kg (186 lb 4.6 oz) 81.2 kg (179 lb 0.2 oz)    Exam:   General:  Awake, in nad  Cardiovascular: regular, s1, s2  Respiratory: normal resp effort, no wheezing  Abdomen: soft, nondistended  Musculoskeletal: perfused, no clubbing   Data Reviewed: Basic Metabolic Panel:  Recent Labs Lab 12/15/14 0316  12/17/14 0314 12/17/14 0943 12/18/14 0719 12/19/14 0555 12/19/14 1043  NA 129*  < > 126* 126* 131* 129*  130*  K 3.9  < > 4.0 3.5 4.4 4.3 4.0  CL 94*  < > 89* 89* 94* 95* 99*  CO2 24  < > 22 23 24 22  21*  GLUCOSE 292*  < > 79 128* 129* 191* 187*  BUN 30*  < > 56* 57* 36* 43* 44*  CREATININE 4.47*  <  > 6.72* 6.84* 4.87* 5.37* 5.78*  CALCIUM 7.9*  < > 8.2* 8.2* 8.2* 8.1* 8.3*  MG 2.2  --   --   --   --   --   --   PHOS 3.3  < > 3.8 4.6 4.0 4.3 4.1  < > = values in this interval not displayed. Liver Function Tests:  Recent Labs Lab 12/17/14 0314 12/17/14 0943 12/18/14 0719 12/19/14 0555 12/19/14 1043  ALBUMIN 1.6* 1.6* 1.8* 3.2* 2.9*   No results for input(s): LIPASE, AMYLASE in the last 168 hours. No results for input(s): AMMONIA in the last 168 hours. CBC:  Recent Labs Lab 12/17/14 0314 12/17/14 0942 12/18/14 0719 12/19/14 0555 12/19/14 1043  WBC 19.5* 20.6* 19.3* 19.7* 19.5*  HGB 8.7* 8.7* 9.0* 8.7* 8.5*  HCT 27.2* 25.8* 27.3* 27.1* 26.2*  MCV 90.7 91.5 93.8 92.8 92.3  PLT 347 368 387 392 373   Cardiac Enzymes: No results for input(s): CKTOTAL, CKMB, CKMBINDEX, TROPONINI in the last 168 hours. BNP (last 3 results)  Recent Labs  11/05/14 2205  BNP 3626.3*    ProBNP (last 3 results)  Recent Labs  02/20/14 1515 02/24/14 1145 11/15/14 1622  PROBNP 15468.0* 10410.0* >4940.0*    CBG:  Recent Labs Lab 12/18/14 0743 12/18/14 1222 12/18/14 1706 12/18/14 2115 12/19/14 0752  GLUCAP 111* 115* 250* 279* 199*    Recent Results (from the past 240 hour(s))  Blood Culture (routine x 2)     Status: None   Collection Time: 12/09/14  8:20 PM  Result Value Ref Range Status   Specimen Description BLOOD RIGHT WRIST  Final   Special Requests BOTTLES DRAWN AEROBIC AND ANAEROBIC 5CC  Final   Culture NO GROWTH 5 DAYS  Final   Report Status 12/14/2014 FINAL  Final  Blood Culture (routine x 2)     Status: None   Collection Time: 12/09/14  8:25 PM  Result Value Ref Range Status   Specimen Description BLOOD RIGHT HAND  Final   Special Requests BOTTLES DRAWN AEROBIC AND ANAEROBIC 4CC  Final   Culture NO GROWTH 5 DAYS  Final   Report Status 12/14/2014 FINAL  Final  C difficile quick scan w PCR reflex     Status: Abnormal   Collection Time: 12/10/14  4:42 AM   Result Value Ref Range Status   C Diff antigen POSITIVE (A) NEGATIVE Final   C Diff toxin NEGATIVE NEGATIVE Final   C Diff interpretation   Final    C. difficile present, but toxin not detected. This indicates colonization. In most cases, this does not require treatment. If patient has signs and symptoms consistent with colitis, consider treatment.  C difficile quick scan w PCR reflex     Status: None   Collection Time: 12/17/14  7:46 PM  Result Value Ref Range Status   C Diff antigen NEGATIVE NEGATIVE Final   C Diff toxin NEGATIVE NEGATIVE Final   C Diff interpretation Negative for toxigenic C. difficile  Final  Culture, blood (routine x 2)     Status: None (Preliminary result)   Collection Time: 12/18/14 10:37 AM  Result Value Ref  Range Status   Specimen Description BLOOD RIGHT HAND  Final   Special Requests BOTTLES DRAWN AEROBIC ONLY 2CC  Final   Culture NO GROWTH < 24 HOURS  Final   Report Status PENDING  Incomplete  Culture, blood (routine x 2)     Status: None (Preliminary result)   Collection Time: 12/18/14 12:15 PM  Result Value Ref Range Status   Specimen Description BLOOD RIGHT HAND  Final   Special Requests BOTTLES DRAWN AEROBIC AND ANAEROBIC 5CC  Final   Culture NO GROWTH < 24 HOURS  Final   Report Status PENDING  Incomplete     Studies: No results found.  Scheduled Meds: . therapeutic plasma exchange solution   Dialysis Q1 Hr x 4  . amiodarone  400 mg Oral Daily  . atorvastatin  40 mg Oral QHS  . calcitRIOL  1.75 mcg Oral Q M,W,F-HD  . calcium carbonate      . calcium carbonate  2 tablet Oral Q3H  . calcium gluconate IVPB  4 g Intravenous Once  . citrate dextrose      . colchicine  0.6 mg Oral Daily  . darbepoetin (ARANESP) injection - DIALYSIS  100 mcg Intravenous Q Mon-HD  . diltiazem  30 mg Oral 4 times per day  . feeding supplement (NEPRO CARB STEADY)  237 mL Oral BID BM  . fluocinonide ointment   Topical QID  . guaiFENesin  600 mg Oral BID  . heparin   1,000 Units Intracatheter Once  . insulin aspart  0-15 Units Subcutaneous TID WC  . insulin aspart  0-5 Units Subcutaneous QHS  . insulin glargine  8 Units Subcutaneous QHS  . metoprolol tartrate  75 mg Oral BID  . multivitamin  1 tablet Oral QHS  . phenytoin  300 mg Oral q morning - 10a  . saccharomyces boulardii  250 mg Oral BID  . sodium chloride  3 mL Intravenous Q12H  . vancomycin  250 mg Oral Q6H  . warfarin  5 mg Oral ONCE-1800  . Warfarin - Pharmacist Dosing Inpatient   Does not apply q1800   Continuous Infusions: . citrate dextrose    . heparin 1,500 Units/hr (12/19/14 0925)    Principal Problem:   Sepsis Active Problems:   Atrial fibrillation-permanent   Essential hypertension   Gout   HCAP (healthcare-associated pneumonia)   ESRD on hemodialysis   Nausea vomiting and diarrhea   Pneumonia   Atrial fibrillation with RVR   Encephalopathy acute   Debility   Acute encephalopathy   Autoimmune encephalomyelitis    CHIU, STEPHEN K  [facility] Pager [phone]. If 7PM-7AM, please contact night-coverage at [email or web address], password [facility] 12/19/2014, 4:23 PM  LOS: 10 days

## 2014-12-19 NOTE — Progress Notes (Signed)
ANTICOAGULATION CONSULT NOTE - Follow Up Consult  Pharmacy Consult for Heparin  Indication: atrial fibrillation  Allergies  Allergen Reactions  . Keflex [Cephalexin] Other (See Comments)    c-diff reaction  . Dilaudid [Hydromorphone] Nausea And Vomiting    Patient Measurements: Height: 6' (182.9 cm) Weight: 187 lb 6.3 oz (85 kg) IBW/kg (Calculated) : 77.6   Vital Signs: Temp: 97.6 F (36.4 C) (08/31 0632) Temp Source: Oral (08/31 4492) BP: 105/67 mmHg (08/31 0100) Pulse Rate: 84 (08/31 0632)  Labs:  Recent Labs  12/17/14 0314 12/17/14 7121 12/17/14 0943 12/18/14 0719 12/18/14 1855 12/19/14 0555  HGB 8.7* 8.7*  --  9.0*  --  8.7*  HCT 27.2* 25.8*  --  27.3*  --  27.1*  PLT 347 368  --  387  --  392  LABPROT 26.1*  --   --  17.9*  --  19.9*  INR 2.42*  --   --  1.47  --  1.69*  HEPARINUNFRC  --   --   --   --  <0.10* <0.10*  CREATININE 6.72*  --  6.84* 4.87*  --  5.37*    Estimated Creatinine Clearance: 16.3 mL/min (by C-G formula based on Cr of 5.37).   Assessment: Sub-therapeutic heparin level, INR up to 1.69 this AM, other labs as above, no issues per RN.   Goal of Therapy:  Heparin level 0.3-0.7 units/ml Monitor platelets by anticoagulation protocol: Yes   Plan:  -Increase heparin to 1500 units/hr -1600 HL -Daily CBC/HL -Monitor for bleeding  Narda Bonds 12/19/2014,7:47 AM

## 2014-12-20 LAB — GLUCOSE, CAPILLARY
GLUCOSE-CAPILLARY: 162 mg/dL — AB (ref 65–99)
Glucose-Capillary: 122 mg/dL — ABNORMAL HIGH (ref 65–99)
Glucose-Capillary: 178 mg/dL — ABNORMAL HIGH (ref 65–99)
Glucose-Capillary: 234 mg/dL — ABNORMAL HIGH (ref 65–99)

## 2014-12-20 LAB — GI PATHOGEN PANEL BY PCR, STOOL
C DIFFICILE TOXIN A/B: NOT DETECTED
Campylobacter by PCR: NOT DETECTED
Cryptosporidium by PCR: NOT DETECTED
E COLI (STEC): NOT DETECTED
E COLI 0157 BY PCR: NOT DETECTED
E coli (ETEC) LT/ST: NOT DETECTED
G lamblia by PCR: NOT DETECTED
Norovirus GI/GII: NOT DETECTED
Rotavirus A by PCR: NOT DETECTED
SALMONELLA BY PCR: NOT DETECTED
Shigella by PCR: NOT DETECTED

## 2014-12-20 LAB — POCT I-STAT, CHEM 8
BUN: 31 mg/dL — ABNORMAL HIGH (ref 6–20)
CALCIUM ION: 1.23 mmol/L (ref 1.12–1.23)
CREATININE: 4.7 mg/dL — AB (ref 0.61–1.24)
Chloride: 98 mmol/L — ABNORMAL LOW (ref 101–111)
Glucose, Bld: 223 mg/dL — ABNORMAL HIGH (ref 65–99)
HEMATOCRIT: 27 % — AB (ref 39.0–52.0)
HEMOGLOBIN: 9.2 g/dL — AB (ref 13.0–17.0)
Potassium: 3.5 mmol/L (ref 3.5–5.1)
SODIUM: 135 mmol/L (ref 135–145)
TCO2: 22 mmol/L (ref 0–100)

## 2014-12-20 LAB — CBC
HEMATOCRIT: 26.7 % — AB (ref 39.0–52.0)
HEMATOCRIT: 25.8 % — AB (ref 39.0–52.0)
HEMOGLOBIN: 8.8 g/dL — AB (ref 13.0–17.0)
Hemoglobin: 8.5 g/dL — ABNORMAL LOW (ref 13.0–17.0)
MCH: 30.4 pg (ref 26.0–34.0)
MCH: 30.8 pg (ref 26.0–34.0)
MCHC: 33 g/dL (ref 30.0–36.0)
MCHC: 32.9 g/dL (ref 30.0–36.0)
MCV: 92.4 fL (ref 78.0–100.0)
MCV: 93.5 fL (ref 78.0–100.0)
Platelets: 350 10*3/uL (ref 150–400)
Platelets: 322 10*3/uL (ref 150–400)
RBC: 2.89 MIL/uL — ABNORMAL LOW (ref 4.22–5.81)
RBC: 2.76 MIL/uL — ABNORMAL LOW (ref 4.22–5.81)
RDW: 19.8 % — AB (ref 11.5–15.5)
RDW: 20.4 % — AB (ref 11.5–15.5)
WBC: 20.4 10*3/uL — AB (ref 4.0–10.5)
WBC: 20.8 10*3/uL — AB (ref 4.0–10.5)

## 2014-12-20 LAB — RENAL FUNCTION PANEL
ANION GAP: 10 (ref 5–15)
Albumin: 3.6 g/dL (ref 3.5–5.0)
Albumin: 3.9 g/dL (ref 3.5–5.0)
Anion gap: 10 (ref 5–15)
BUN: 26 mg/dL — AB (ref 6–20)
BUN: 31 mg/dL — AB (ref 6–20)
CHLORIDE: 102 mmol/L (ref 101–111)
CHLORIDE: 104 mmol/L (ref 101–111)
CO2: 23 mmol/L (ref 22–32)
CO2: 21 mmol/L — ABNORMAL LOW (ref 22–32)
Calcium: 8 mg/dL — ABNORMAL LOW (ref 8.9–10.3)
Calcium: 8 mg/dL — ABNORMAL LOW (ref 8.9–10.3)
Creatinine, Ser: 3.9 mg/dL — ABNORMAL HIGH (ref 0.61–1.24)
Creatinine, Ser: 4.8 mg/dL — ABNORMAL HIGH (ref 0.61–1.24)
GFR calc Af Amer: 18 mL/min — ABNORMAL LOW (ref >60)
GFR calc Af Amer: 14 mL/min — ABNORMAL LOW (ref >60)
GFR calc non Af Amer: 16 mL/min — ABNORMAL LOW (ref >60)
GFR, EST NON AFRICAN AMERICAN: 12 mL/min — AB (ref >60)
GLUCOSE: 152 mg/dL — AB (ref 65–99)
Glucose, Bld: 247 mg/dL — ABNORMAL HIGH (ref 65–99)
POTASSIUM: 3.6 mmol/L (ref 3.5–5.1)
POTASSIUM: 3.6 mmol/L (ref 3.5–5.1)
Phosphorus: 3.2 mg/dL (ref 2.5–4.6)
Phosphorus: 3.5 mg/dL (ref 2.5–4.6)
Sodium: 135 mmol/L (ref 135–145)
Sodium: 135 mmol/L (ref 135–145)

## 2014-12-20 LAB — PROTIME-INR
INR: 2.23 — ABNORMAL HIGH (ref 0.00–1.49)
PROTHROMBIN TIME: 24.5 s — AB (ref 11.6–15.2)

## 2014-12-20 LAB — HEPARIN LEVEL (UNFRACTIONATED): Heparin Unfractionated: 0.33 IU/mL (ref 0.30–0.70)

## 2014-12-20 MED ORDER — HEPARIN SODIUM (PORCINE) 1000 UNIT/ML IJ SOLN: 1000.0000 [IU] | Freq: Once | INTRAMUSCULAR | Status: DC

## 2014-12-20 MED ORDER — SODIUM CHLORIDE 0.9 % IV SOLN
4.0000 g | Freq: Once | INTRAVENOUS | Status: DC
  Administered 2014-12-20: 4 g via INTRAVENOUS
  Filled 2014-12-20: qty 40

## 2014-12-20 MED ORDER — SODIUM CHLORIDE 0.9 % IV SOLN: 100.0000 mL | INTRAVENOUS | Status: DC | PRN

## 2014-12-20 MED ORDER — CALCIUM CARBONATE ANTACID 500 MG PO CHEW
2.0000 | CHEWABLE_TABLET | ORAL | Status: AC
  Administered 2014-12-20: 400 mg via ORAL
  Filled 2014-12-20: qty 2

## 2014-12-20 MED ORDER — ACETAMINOPHEN 325 MG PO TABS: 650.0000 mg | ORAL_TABLET | ORAL | Status: DC | PRN

## 2014-12-20 MED ORDER — DIPHENHYDRAMINE HCL 25 MG PO CAPS
25.0000 mg | ORAL_CAPSULE | Freq: Four times a day (QID) | ORAL | Status: DC | PRN
  Administered 2014-12-20 – 2014-12-21 (×2): 25 mg via ORAL
  Filled 2014-12-20: qty 1

## 2014-12-20 MED ORDER — ALTEPLASE 2 MG IJ SOLR: 2.0000 mg | Freq: Once | INTRAMUSCULAR | Status: DC | PRN

## 2014-12-20 MED ORDER — LIDOCAINE HCL (PF) 1 % IJ SOLN: 5.0000 mL | INTRAMUSCULAR | Status: DC | PRN

## 2014-12-20 MED ORDER — ACD FORMULA A 0.73-2.45-2.2 GM/100ML VI SOLN
Status: AC
Start: 2014-12-20 — End: 2014-12-20
  Filled 2014-12-20: qty 500

## 2014-12-20 MED ORDER — ACD FORMULA A 0.73-2.45-2.2 GM/100ML VI SOLN
500.0000 mL | Status: DC
Start: 2014-12-21 — End: 2014-12-21
  Filled 2014-12-20 (×4): qty 500

## 2014-12-20 MED ORDER — HEPARIN SODIUM (PORCINE) 1000 UNIT/ML DIALYSIS: 1000.0000 [IU] | INTRAMUSCULAR | Status: DC | PRN

## 2014-12-20 MED ORDER — SODIUM CHLORIDE 0.9 % IV SOLN
4.0000 g | Freq: Once | INTRAVENOUS | Status: DC
  Filled 2014-12-20: qty 40

## 2014-12-20 MED ORDER — DIPHENHYDRAMINE HCL 25 MG PO CAPS
25.0000 mg | ORAL_CAPSULE | Freq: Four times a day (QID) | ORAL | Status: DC | PRN
  Administered 2014-12-20: 25 mg via ORAL

## 2014-12-20 MED ORDER — ACD FORMULA A 0.73-2.45-2.2 GM/100ML VI SOLN
500.0000 mL | Status: DC
  Filled 2014-12-20: qty 500

## 2014-12-20 MED ORDER — SODIUM CHLORIDE 0.9 % IV SOLN
INTRAVENOUS | Status: AC
  Administered 2014-12-20 (×2): via INTRAVENOUS_CENTRAL
  Filled 2014-12-20 (×4): qty 200

## 2014-12-20 MED ORDER — SODIUM CHLORIDE 0.9 % IV SOLN
4.0000 g | Freq: Once | INTRAVENOUS | Status: AC
  Administered 2014-12-21: 4 g via INTRAVENOUS
  Filled 2014-12-20 (×3): qty 40

## 2014-12-20 MED ORDER — SODIUM CHLORIDE 0.9 % IV SOLN
Freq: Once | INTRAVENOUS | Status: AC
  Administered 2014-12-21: 16:00:00 via INTRAVENOUS_CENTRAL
  Filled 2014-12-20: qty 200

## 2014-12-20 MED ORDER — LIDOCAINE-PRILOCAINE 2.5-2.5 % EX CREA: 1.0000 "application " | TOPICAL_CREAM | CUTANEOUS | Status: DC | PRN

## 2014-12-20 MED ORDER — ACD FORMULA A 0.73-2.45-2.2 GM/100ML VI SOLN
Status: AC
  Filled 2014-12-20: qty 500

## 2014-12-20 MED ORDER — ACD FORMULA A 0.73-2.45-2.2 GM/100ML VI SOLN
500.0000 mL | Status: DC
  Administered 2014-12-20: 500 mL via INTRAVENOUS
  Filled 2014-12-20: qty 500

## 2014-12-20 MED ORDER — CALCIUM CARBONATE ANTACID 500 MG PO CHEW
CHEWABLE_TABLET | ORAL | Status: AC
  Filled 2014-12-20: qty 2

## 2014-12-20 MED ORDER — ALBUMIN HUMAN 25 % IV SOLN
Freq: Once | INTRAVENOUS | Status: DC
  Filled 2014-12-20: qty 200

## 2014-12-20 MED ORDER — PENTAFLUOROPROP-TETRAFLUOROETH EX AERO: 1.0000 "application " | INHALATION_SPRAY | CUTANEOUS | Status: DC | PRN

## 2014-12-20 NOTE — Progress Notes (Signed)
[facility] PROGRESS NOTE  Joseph [name] TWS:[id number] DOB: 1956/03/23 DOA: 12/09/2014 PCP: Webb Silversmith, NP  Assessment/Plan: Sepsis secondary to healthcare associated pneumonia -Completed 7 day course of antibiotics on 8/28 . -Blood culture negative. Hemoptysis on presentation resolved. H&H stable. Pulmonary has since signed off..  -Given persistent leukocytosis to be chest x-ray ordered. Does not show new infiltrate. -Recommend follow-up chest x-ray in 4 weeks to see for resolution. -Stable  Persistent leukocytosis -Patient having off-and-on diarrhea. -Was treated with 2 weeks course of high-dose vancomycin during recent hospitalization. Repeat C. difficile negative. Dr. Clementeen Graham had spoken with Dr. Megan Salon on 8/29 who recommended to treat with oral vancomycin for one week. -Monitor WBC. Ordered blood culture. GI pathogen panel pending -Stable thus far. No fevers  Acute encephalopathy -Associated with underlying sepsis versus ongoing encephalitis. Patient was recently hospitalized for limbic encephalitis. MRI on admission showing persistent abnormal T2/FLAIR signal intensity involving mesial temporal lobes/hippocampi bilaterally which seems to have improved overall compared to MRI from 11/21/2014. -Patient remains confused off and on and occasionally hallucinating. EEG was repeated and is unremarkable for acute seizures. -Received IV Solu-Medrol for 5 days during recent hospitalization.  -LP was negative.  -Ultrasound of the scrotum to rule out testicular malignancy was negative. (As per neurology to rule out paraneoplastic encephalitis). -Neurology suggest this could be post infectious versus autoimmune encephalitis. -Recommendation for starting plasma exchange daily . HD catheter was placed on 8/29 and patient started plasma exchange on 8/30, second on 8/31 -Clinically seems improved and more oriented today  A. fib with RVR -Noted after returning from dialysis on 8/22.   -CHADS 2 -Required Cardizem and amiodarone drip. Now on by mouth amiodarone .Remains off Cardizem drip On metoprolol twice a day. Currently rate controlled. -Coumadin resumed. -Echo from 08/2014 showed LVH with EF of 50-55%  ESRD on hemodialysis -Renal following.  -Scheduled HD to be continued -Nephrology following  History of seizure -Continue Dilantin.  Diabetes mellitus -Glucose improved after low-dose Lantus added.  Protein calorie malnutrition -Continue supplements  Code Status: Full Family Communication: Pt in room, wife at bedside Disposition Plan: Pending   Consultants:  Nephrology  Procedures:    Antibiotics:  IV vancomycin and Zosyn since 8/22-8/27  levaquin 8/27-8/28  PO vanc 8/29>>>  HPI/Subjective: Reports thinking more clearly today  Objective: Filed Vitals:   12/19/14 1800 12/19/14 2247 12/20/14 0524 12/20/14 1334  BP: 123/75 105/67 102/68 111/69  Pulse: 94 48 97   Temp: 97.2 F (36.2 C) 98.7 F (37.1 C) 98.4 F (36.9 C) 98.2 F (36.8 C)  TempSrc: Oral Oral Oral Oral  Resp: 28 15 17 18   Height:      Weight:      SpO2: 95% 98% 100% 99%    Intake/Output Summary (Last 24 hours) at 12/20/14 1504 Last data filed at 12/20/14 1424  Gross per 24 hour  Intake 307.13 ml  Output      0 ml  Net 307.13 ml   Filed Weights   12/18/14 0300 12/19/14 1023 12/19/14 1326  Weight: 85 kg (187 lb 6.3 oz) 84.5 kg (186 lb 4.6 oz) 81.2 kg (179 lb 0.2 oz)    Exam:   General:  Awake, sitting in chair, in nad  Cardiovascular: regular, s1, s2  Respiratory: normal resp effort, no wheezing  Abdomen: soft, nondistended  Musculoskeletal: perfused, no clubbing, no cyanosis  Data Reviewed: Basic Metabolic Panel:  Recent Labs Lab 12/15/14 0316  12/17/14 0943 12/18/14 0719 12/19/14 0555 12/19/14 1043 12/19/14 1632 12/20/14  0234  NA 129*  < > 126* 131* 129* 130* 137 135  [name] 3.9  < > 3.5 4.4 4.3 4.0 3.1* 3.6  CL 94*  < > 89* 94* 95* 99*  94* 102  CO2 24  < > 23 24 22  21*  --  23  GLUCOSE 292*  < > 128* 129* 191* 187* 257* 152*  BUN 30*  < > 57* 36* 43* 44* 22* 26*  CREATININE 4.47*  < > 6.84* 4.87* 5.37* 5.78* 3.40* 3.90*  CALCIUM 7.9*  < > 8.2* 8.2* 8.1* 8.3*  --  8.0*  MG 2.2  --   --   --   --   --   --   --   PHOS 3.3  < > 4.6 4.0 4.3 4.1  --  3.2  < > = values in this interval not displayed. Liver Function Tests:  Recent Labs Lab 12/17/14 0943 12/18/14 0719 12/19/14 0555 12/19/14 1043 12/20/14 0234  ALBUMIN 1.6* 1.8* 3.2* 2.9* 3.6   No results for input(s): LIPASE, AMYLASE in the last 168 hours. No results for input(s): AMMONIA in the last 168 hours. CBC:  Recent Labs Lab 12/17/14 0942 12/18/14 0719 12/19/14 0555 12/19/14 1043 12/19/14 1632 12/20/14 0234  WBC 20.6* 19.3* 19.7* 19.5*  --  20.4*  HGB 8.7* 9.0* 8.7* 8.5* 10.2* 8.8*  HCT 25.8* 27.3* 27.1* 26.2* 30.0* 26.7*  MCV 91.5 93.8 92.8 92.3  --  92.4  PLT 368 387 392 373  --  350   Cardiac Enzymes: No results for input(s): CKTOTAL, CKMB, CKMBINDEX, TROPONINI in the last 168 hours. BNP (last 3 results)  Recent Labs  11/05/14 2205  BNP 3626.3*    ProBNP (last 3 results)  Recent Labs  02/20/14 1515 02/24/14 1145 11/15/14 1622  PROBNP 15468.0* 10410.0* >4940.0*    CBG:  Recent Labs Lab 12/19/14 0752 12/19/14 1833 12/19/14 2117 12/20/14 0810 12/20/14 1216  GLUCAP 199* 225* 309* 122* 162*    Recent Results (from the past 240 hour(s))  C difficile quick scan w PCR reflex     Status: None   Collection Time: 12/17/14  7:46 PM  Result Value Ref Range Status   C Diff antigen NEGATIVE NEGATIVE Final   C Diff toxin NEGATIVE NEGATIVE Final   C Diff interpretation Negative for toxigenic C. difficile  Final  Culture, blood (routine x 2)     Status: None (Preliminary result)   Collection Time: 12/18/14 10:37 AM  Result Value Ref Range Status   Specimen Description BLOOD RIGHT HAND  Final   Special Requests BOTTLES DRAWN AEROBIC  ONLY 2CC  Final   Culture NO GROWTH 2 DAYS  Final   Report Status PENDING  Incomplete  Culture, blood (routine x 2)     Status: None (Preliminary result)   Collection Time: 12/18/14 12:15 PM  Result Value Ref Range Status   Specimen Description BLOOD RIGHT HAND  Final   Special Requests BOTTLES DRAWN AEROBIC AND ANAEROBIC 5CC  Final   Culture NO GROWTH 2 DAYS  Final   Report Status PENDING  Incomplete     Studies: No results found.  Scheduled Meds: . therapeutic plasma exchange solution   Dialysis Q1 Hr x 4  . amiodarone  400 mg Oral Daily  . atorvastatin  40 mg Oral QHS  . calcitRIOL  1.75 mcg Oral Q M,W,F-HD  . calcium carbonate  2 tablet Oral Q3H  . calcium gluconate IVPB  4 g Intravenous Once  .  citrate dextrose      . colchicine  0.6 mg Oral Daily  . darbepoetin (ARANESP) injection - DIALYSIS  100 mcg Intravenous Q Mon-HD  . diltiazem  30 mg Oral 4 times per day  . feeding supplement (NEPRO CARB STEADY)  237 mL Oral BID BM  . fluocinonide ointment   Topical QID  . guaiFENesin  600 mg Oral BID  . heparin  1,000 Units Intracatheter Once  . heparin  1,000 Units Intracatheter Once  . insulin aspart  0-15 Units Subcutaneous TID WC  . insulin aspart  0-5 Units Subcutaneous QHS  . insulin glargine  8 Units Subcutaneous QHS  . metoprolol tartrate  75 mg Oral BID  . multivitamin  1 tablet Oral QHS  . phenytoin  300 mg Oral q morning - 10a  . saccharomyces boulardii  250 mg Oral BID  . sodium chloride  3 mL Intravenous Q12H  . vancomycin  250 mg Oral Q6H  . Warfarin - Pharmacist Dosing Inpatient   Does not apply q1800   Continuous Infusions: . citrate dextrose    . citrate dextrose    . heparin 1,800 Units/hr (12/20/14 0318)    Principal Problem:   Sepsis Active Problems:   Atrial fibrillation-permanent   Essential hypertension   Gout   HCAP (healthcare-associated pneumonia)   ESRD on hemodialysis   Nausea vomiting and diarrhea   Pneumonia   Atrial fibrillation  with RVR   Encephalopathy acute   Debility   Acute encephalopathy   Autoimmune encephalomyelitis    Joseph [name]  [facility] Pager [phone]. If 7PM-7AM, please contact night-coverage at [email or web address], password [facility] 12/20/2014, 3:04 PM  LOS: 11 days

## 2014-12-20 NOTE — Progress Notes (Signed)
[facility] ROUNDING NOTE   Subjective:   Interval History:  No complaints better today  Objective:  Vital signs in last 24 hours:  Temp:  [97 F (36.1 C)-98.7 F (37.1 C)] 98.2 F (36.8 C) (09/01 1334) Pulse Rate:  [48-106] 97 (09/01 0524) Resp:  [15-28] 18 (09/01 1334) BP: (102-139)/(66-81) 111/69 mmHg (09/01 1334) SpO2:  [94 %-100 %] 99 % (09/01 1334)  Weight change:  Filed Weights   12/18/14 0300 12/19/14 1023 12/19/14 1326  Weight: 85 kg (187 lb 6.3 oz) 84.5 kg (186 lb 4.6 oz) 81.2 kg (179 lb 0.2 oz)    Intake/Output: I/O last 3 completed shifts: In: 645.1 [P.O.:240; I.V.:168.1; NG/GT:237] Out: 3543 [Other:3542; Stool:1]   Intake/Output this shift:  Total I/O In: 120 [P.O.:120] Out: -   CVS- RRR RS- CTA ABD- BS present soft non-distended EXT-  2-3 + edema  Basic Metabolic Panel:  Recent Labs Lab 12/15/14 0316  12/17/14 0943 12/18/14 0719 12/19/14 0555 12/19/14 1043 12/19/14 1632 12/20/14 0234  NA 129*  < > 126* 131* 129* 130* 137 135  K 3.9  < > 3.5 4.4 4.3 4.0 3.1* 3.6  CL 94*  < > 89* 94* 95* 99* 94* 102  CO2 24  < > 23 24 22  21*  --  23  GLUCOSE 292*  < > 128* 129* 191* 187* 257* 152*  BUN 30*  < > 57* 36* 43* 44* 22* 26*  CREATININE 4.47*  < > 6.84* 4.87* 5.37* 5.78* 3.40* 3.90*  CALCIUM 7.9*  < > 8.2* 8.2* 8.1* 8.3*  --  8.0*  MG 2.2  --   --   --   --   --   --   --   PHOS 3.3  < > 4.6 4.0 4.3 4.1  --  3.2  < > = values in this interval not displayed.  Liver Function Tests:  Recent Labs Lab 12/17/14 0943 12/18/14 0719 12/19/14 0555 12/19/14 1043 12/20/14 0234  ALBUMIN 1.6* 1.8* 3.2* 2.9* 3.6   No results for input(s): LIPASE, AMYLASE in the last 168 hours. No results for input(s): AMMONIA in the last 168 hours.  CBC:  Recent Labs Lab 12/17/14 0942 12/18/14 0719 12/19/14 0555 12/19/14 1043 12/19/14 1632 12/20/14 0234  WBC 20.6* 19.3* 19.7* 19.5*  --  20.4*  HGB 8.7* 9.0* 8.7* 8.5* 10.2* 8.8*  HCT 25.8*  27.3* 27.1* 26.2* 30.0* 26.7*  MCV 91.5 93.8 92.8 92.3  --  92.4  PLT 368 387 392 373  --  350    Cardiac Enzymes: No results for input(s): CKTOTAL, CKMB, CKMBINDEX, TROPONINI in the last 168 hours.  BNP: Invalid input(s): POCBNP  CBG:  Recent Labs Lab 12/19/14 0752 12/19/14 1833 12/19/14 2117 12/20/14 0810 12/20/14 1216  GLUCAP 199* 225* 309* 122* 162*    Microbiology: Results for orders placed or performed during the hospital encounter of 12/09/14  Blood Culture (routine x 2)     Status: None   Collection Time: 12/09/14  8:20 PM  Result Value Ref Range Status   Specimen Description BLOOD RIGHT WRIST  Final   Special Requests BOTTLES DRAWN AEROBIC AND ANAEROBIC 5CC  Final   Culture NO GROWTH 5 DAYS  Final   Report Status 12/14/2014 FINAL  Final  Blood Culture (routine x 2)     Status: None   Collection Time: 12/09/14  8:25 PM  Result Value Ref Range Status   Specimen Description BLOOD RIGHT HAND  Final   Special Requests  BOTTLES DRAWN AEROBIC AND ANAEROBIC 4CC  Final   Culture NO GROWTH 5 DAYS  Final   Report Status 12/14/2014 FINAL  Final  C difficile quick scan w PCR reflex     Status: Abnormal   Collection Time: 12/10/14  4:42 AM  Result Value Ref Range Status   C Diff antigen POSITIVE (A) NEGATIVE Final   C Diff toxin NEGATIVE NEGATIVE Final   C Diff interpretation   Final    C. difficile present, but toxin not detected. This indicates colonization. In most cases, this does not require treatment. If patient has signs and symptoms consistent with colitis, consider treatment.  C difficile quick scan w PCR reflex     Status: None   Collection Time: 12/17/14  7:46 PM  Result Value Ref Range Status   C Diff antigen NEGATIVE NEGATIVE Final   C Diff toxin NEGATIVE NEGATIVE Final   C Diff interpretation Negative for toxigenic C. difficile  Final  Culture, blood (routine x 2)     Status: None (Preliminary result)   Collection Time: 12/18/14 10:37 AM  Result Value Ref  Range Status   Specimen Description BLOOD RIGHT HAND  Final   Special Requests BOTTLES DRAWN AEROBIC ONLY 2CC  Final   Culture NO GROWTH 2 DAYS  Final   Report Status PENDING  Incomplete  Culture, blood (routine x 2)     Status: None (Preliminary result)   Collection Time: 12/18/14 12:15 PM  Result Value Ref Range Status   Specimen Description BLOOD RIGHT HAND  Final   Special Requests BOTTLES DRAWN AEROBIC AND ANAEROBIC 5CC  Final   Culture NO GROWTH 2 DAYS  Final   Report Status PENDING  Incomplete    Coagulation Studies:  Recent Labs  12/18/14 0719 12/19/14 0555 12/20/14 0234  LABPROT 17.9* 19.9* 24.5*  INR 1.47 1.69* 2.23*    Urinalysis: No results for input(s): COLORURINE, LABSPEC, PHURINE, GLUCOSEU, HGBUR, BILIRUBINUR, KETONESUR, PROTEINUR, UROBILINOGEN, NITRITE, LEUKOCYTESUR in the last 72 hours.  Invalid input(s): APPERANCEUR    Imaging: No results found.   Medications:   . citrate dextrose    . citrate dextrose    . heparin 1,800 Units/hr (12/20/14 0318)   . therapeutic plasma exchange solution   Dialysis Q1 Hr x 4  . amiodarone  400 mg Oral Daily  . atorvastatin  40 mg Oral QHS  . calcitRIOL  1.75 mcg Oral Q M,W,F-HD  . calcium carbonate  2 tablet Oral Q3H  . calcium gluconate IVPB  4 g Intravenous Once  . colchicine  0.6 mg Oral Daily  . darbepoetin (ARANESP) injection - DIALYSIS  100 mcg Intravenous Q Mon-HD  . diltiazem  30 mg Oral 4 times per day  . feeding supplement (NEPRO CARB STEADY)  237 mL Oral BID BM  . fluocinonide ointment   Topical QID  . guaiFENesin  600 mg Oral BID  . heparin  1,000 Units Intracatheter Once  . heparin  1,000 Units Intracatheter Once  . insulin aspart  0-15 Units Subcutaneous TID WC  . insulin aspart  0-5 Units Subcutaneous QHS  . insulin glargine  8 Units Subcutaneous QHS  . metoprolol tartrate  75 mg Oral BID  . multivitamin  1 tablet Oral QHS  . phenytoin  300 mg Oral q morning - 10a  . saccharomyces boulardii   250 mg Oral BID  . sodium chloride  3 mL Intravenous Q12H  . vancomycin  250 mg Oral Q6H  . 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

## 2014-12-20 NOTE — Care Management Important Message (Signed)
Important Message  Patient Details  Name: LORENE SAMAAN MRN: [id number] Date of Birth: 11-Feb-1956   Medicare Important Message Given:  Yes-third notification given    [name], RN 12/20/2014, 10:09 AMImportant Message  Patient Details  Name: THAI BURGUENO MRN: [id number] Date of Birth: [birth year]   Medicare Important Message Given:  Yes-third notification given    [name], RN 12/20/2014, 10:09 AM

## 2014-12-20 NOTE — Progress Notes (Signed)
Subjective:  Received first TPE on 8/30.  No complications.  HE currently is resting comfortably and has no complaints.   Objective: Current vital signs: BP 102/68 mmHg  Pulse 97  Temp(Src) 98.4 F (36.9 C) (Oral)  Resp 17  Ht 6' (1.829 m)  Wt 81.2 kg (179 lb 0.2 oz)  BMI 24.27 kg/m2  SpO2 100% Vital signs in last 24 hours: Temp:  [97 F (36.1 C)-98.7 F (37.1 C)] 98.4 F (36.9 C) (09/01 0524) Pulse Rate:  [48-106] 97 (09/01 0524) Resp:  [15-28] 17 (09/01 0524) BP: (99-139)/(66-81) 102/68 mmHg (09/01 0524) SpO2:  [94 %-100 %] 100 % (09/01 0524) Weight:  [81.2 kg (179 lb 0.2 oz)-84.5 kg (186 lb 4.6 oz)] 81.2 kg (179 lb 0.2 oz) (08/31 1326)  Intake/Output from previous day: 08/31 0701 - 09/01 0700 In: 645.1 [P.O.:240; I.V.:168.1; NG/GT:237] Out: 3543 [Stool:1] Intake/Output this shift:   Nutritional status: Diet renal with fluid restriction Fluid restriction:: 1200 mL Fluid; Room service appropriate?: Yes; Fluid consistency:: Thin  Neurologic Exam: General: Mental Status: Alert, oriented to hospital, not year but did know the month due to a person just telling him.  Speech fluent without evidence of aphasia.  Able to follow 3 step commands without difficulty. Cranial Nerves: II:  Visual fields grossly normal, pupils equal, round, reactive to light and accommodation III,IV, VI: ptosis not present, extra-ocular motions intact bilaterally V,VII: smile symmetric, facial light touch sensation normal bilaterally VIII: hearing normal bilaterally IX,X: uvula rises symmetrically XI: bilateral shoulder shrug XII: midline tongue extension without atrophy or fasciculations  Motor: Right : Upper extremity   5/5    Left:     Upper extremity   5/5  Lower extremity   4/5     Lower extremity   4/5 Tone and bulk:normal tone throughout; no atrophy noted Sensory: Pinprick and light touch intact throughout, bilaterally Deep Tendon Reflexes:  2+ in bilateral UE and no KJ or  AJ  Plantars: Right: downgoing   Left: downgoing    Lab Results: Basic Metabolic Panel:  Recent Labs Lab 12/15/14 0316  12/17/14 0943 12/18/14 0719 12/19/14 0555 12/19/14 1043 12/19/14 1632 12/20/14 0234  NA 129*  < > 126* 131* 129* 130* 137 135  K 3.9  < > 3.5 4.4 4.3 4.0 3.1* 3.6  CL 94*  < > 89* 94* 95* 99* 94* 102  CO2 24  < > 23 24 22  21*  --  23  GLUCOSE 292*  < > 128* 129* 191* 187* 257* 152*  BUN 30*  < > 57* 36* 43* 44* 22* 26*  CREATININE 4.47*  < > 6.84* 4.87* 5.37* 5.78* 3.40* 3.90*  CALCIUM 7.9*  < > 8.2* 8.2* 8.1* 8.3*  --  8.0*  MG 2.2  --   --   --   --   --   --   --   PHOS 3.3  < > 4.6 4.0 4.3 4.1  --  3.2  < > = values in this interval not displayed.  Liver Function Tests:  Recent Labs Lab 12/17/14 0943 12/18/14 0719 12/19/14 0555 12/19/14 1043 12/20/14 0234  ALBUMIN 1.6* 1.8* 3.2* 2.9* 3.6   No results for input(s): LIPASE, AMYLASE in the last 168 hours. No results for input(s): AMMONIA in the last 168 hours.  CBC:  Recent Labs Lab 12/17/14 0942 12/18/14 0719 12/19/14 0555 12/19/14 1043 12/19/14 1632 12/20/14 0234  WBC 20.6* 19.3* 19.7* 19.5*  --  20.4*  HGB 8.7* 9.0* 8.7* 8.5*  10.2* 8.8*  HCT 25.8* 27.3* 27.1* 26.2* 30.0* 26.7*  MCV 91.5 93.8 92.8 92.3  --  92.4  PLT 368 387 392 373  --  350    Cardiac Enzymes: No results for input(s): CKTOTAL, CKMB, CKMBINDEX, TROPONINI in the last 168 hours.  Lipid Panel: No results for input(s): CHOL, TRIG, HDL, CHOLHDL, VLDL, LDLCALC in the last 168 hours.  CBG:  Recent Labs Lab 12/18/14 2115 12/19/14 0752 12/19/14 1833 12/19/14 2117 12/20/14 0810  GLUCAP 279* 199* 225* 309* 122*    Microbiology: Results for orders placed or performed during the hospital encounter of 12/09/14  Blood Culture (routine x 2)     Status: None   Collection Time: 12/09/14  8:20 PM  Result Value Ref Range Status   Specimen Description BLOOD RIGHT WRIST  Final   Special Requests BOTTLES DRAWN  AEROBIC AND ANAEROBIC 5CC  Final   Culture NO GROWTH 5 DAYS  Final   Report Status 12/14/2014 FINAL  Final  Blood Culture (routine x 2)     Status: None   Collection Time: 12/09/14  8:25 PM  Result Value Ref Range Status   Specimen Description BLOOD RIGHT HAND  Final   Special Requests BOTTLES DRAWN AEROBIC AND ANAEROBIC 4CC  Final   Culture NO GROWTH 5 DAYS  Final   Report Status 12/14/2014 FINAL  Final  C difficile quick scan w PCR reflex     Status: Abnormal   Collection Time: 12/10/14  4:42 AM  Result Value Ref Range Status   C Diff antigen POSITIVE (A) NEGATIVE Final   C Diff toxin NEGATIVE NEGATIVE Final   C Diff interpretation   Final    C. difficile present, but toxin not detected. This indicates colonization. In most cases, this does not require treatment. If patient has signs and symptoms consistent with colitis, consider treatment.  C difficile quick scan w PCR reflex     Status: None   Collection Time: 12/17/14  7:46 PM  Result Value Ref Range Status   C Diff antigen NEGATIVE NEGATIVE Final   C Diff toxin NEGATIVE NEGATIVE Final   C Diff interpretation Negative for toxigenic C. difficile  Final  Culture, blood (routine x 2)     Status: None (Preliminary result)   Collection Time: 12/18/14 10:37 AM  Result Value Ref Range Status   Specimen Description BLOOD RIGHT HAND  Final   Special Requests BOTTLES DRAWN AEROBIC ONLY 2CC  Final   Culture NO GROWTH < 24 HOURS  Final   Report Status PENDING  Incomplete  Culture, blood (routine x 2)     Status: None (Preliminary result)   Collection Time: 12/18/14 12:15 PM  Result Value Ref Range Status   Specimen Description BLOOD RIGHT HAND  Final   Special Requests BOTTLES DRAWN AEROBIC AND ANAEROBIC 5CC  Final   Culture NO GROWTH < 24 HOURS  Final   Report Status PENDING  Incomplete    Coagulation Studies:  Recent Labs  12/18/14 0719 12/19/14 0555 12/20/14 0234  LABPROT 17.9* 19.9* 24.5*  INR 1.47 1.69* 2.23*     Imaging: No results found.  Medications:  Scheduled: . amiodarone  400 mg Oral Daily  . atorvastatin  40 mg Oral QHS  . calcitRIOL  1.75 mcg Oral Q M,W,F-HD  . colchicine  0.6 mg Oral Daily  . darbepoetin (ARANESP) injection - DIALYSIS  100 mcg Intravenous Q Mon-HD  . diltiazem  30 mg Oral 4 times per day  . feeding  supplement (NEPRO CARB STEADY)  237 mL Oral BID BM  . fluocinonide ointment   Topical QID  . guaiFENesin  600 mg Oral BID  . heparin  1,000 Units Intracatheter Once  . insulin aspart  0-15 Units Subcutaneous TID WC  . insulin aspart  0-5 Units Subcutaneous QHS  . insulin glargine  8 Units Subcutaneous QHS  . metoprolol tartrate  75 mg Oral BID  . multivitamin  1 tablet Oral QHS  . phenytoin  300 mg Oral q morning - 10a  . saccharomyces boulardii  250 mg Oral BID  . sodium chloride  3 mL Intravenous Q12H  . vancomycin  250 mg Oral Q6H  . Warfarin - Pharmacist Dosing Inpatient   Does not apply q1800    Assessment/Plan: 59 year old male who presented with an encephalopathic state in early August and was found to have bilateral temporal changes concerning for limbic encephalitis. He had an extensive workup including both a CSF and serum paraneoplastic panel ([facility] panel) which were negative. CSF infectious process was ruled out with LP.  Repeat MRI showed changes that could represent injury or could represent ongoing encephalitis.  Patient currently being treated for C diff.  He has received his first dose of PLX and to receive his second dose today. No significant change noted thus far. Will continue to follow.   Etta Quill PA-C [facility] [phone]  Patient seen and examined together with physician assistant and I concur with the assessment and plan.  Dorian Pod, MD  12/20/2014, 9:26 AM

## 2014-12-20 NOTE — Progress Notes (Signed)
Physical Therapy Treatment Patient Details Name: Joseph [name] MRN: [id number] DOB: June 22, 1955 Today's Date: 12/20/2014    History of Present Illness 59 yo male readmission with fever cough HCAP, anemia, and MRI abnormal t2-flair involving temporal lobes/ hippocami bilaterally with question of trace diffusion abnormally within R hippocampus. MRI reveals remote L PCA infarct. Pt with recent d/c 11/30/14 PMH: CDIFF, limbic encephalitis, ESRD, DM, CHF, CKD, PNA, HTN, cardioversion, AV fistula L forearm, R heart catheterization    PT Comments    Patient motivated to work with PT.  Making improvements with mobility.  Agree with need for CIR at discharge.  Follow Up Recommendations  CIR     Equipment Recommendations  None recommended by PT    Recommendations for Other Services Rehab consult     Precautions / Restrictions Precautions Precautions: Fall Precaution Comments: oxygen needs, multiple lines/ leads Restrictions Weight Bearing Restrictions: No    Mobility  Bed Mobility               General bed mobility comments: Patient in chair as PT entered room.  Transfers Overall transfer level: Needs assistance Equipment used: Rolling walker (2 wheeled) Transfers: Sit to/from Stand Sit to Stand: Mod assist         General transfer comment: Verbal cues for technique.  Worked on sit <> stand x3.  Cues to shift weight forward.  Assist to power up to standing.  Once upright, able to maintain balance with use of RW.  Patient stepped in place x 15 steps each foot on each stance.  Assist to control descent into chair, improved on final attempt.  Ambulation/Gait             General Gait Details: Did not attempt without +2 assist.  Steps in place (45 with each foot)   Stairs            Wheelchair Mobility    Modified Rankin (Stroke Patients Only)       Balance           Standing balance support: Bilateral upper extremity supported Standing balance-Leahy  Scale: Poor Standing balance comment: Use of RW for balance                    Cognition Arousal/Alertness: Awake/alert Behavior During Therapy: WFL for tasks assessed/performed Overall Cognitive Status: Impaired/Different from baseline Area of Impairment: Orientation;Memory Orientation Level: Disoriented to;Time;Situation   Memory: Decreased short-term memory         General Comments: Patient unable to state year/month/day.  He states he is still working - has been on disability x2 years.    Exercises      General Comments General comments (skin integrity, edema, etc.): Noted edema BLE's with Rt lower leg weaping anterior shin.      Pertinent Vitals/Pain Pain Assessment: No/denies pain    Home Living                      Prior Function            PT Goals (current goals can now be found in the care plan section) Progress towards PT goals: Progressing toward goals    Frequency  Min 3X/week    PT Plan Current plan remains appropriate    Co-evaluation             End of Session Equipment Utilized During Treatment: Gait belt;Oxygen Activity Tolerance: Patient limited by fatigue Patient left: in chair;with call bell/phone within reach;with  chair alarm set;with family/visitor present     Time: 0626-9485 PT Time Calculation (min) (ACUTE ONLY): 23 min  Charges:  $Therapeutic Activity: 23-37 mins                    G Codes:      Despina Pole 2015-01-15, 10:54 AM Carita Pian. Sanjuana Kava, [place] Pager [phone]

## 2014-12-20 NOTE — Progress Notes (Signed)
Inpatient Diabetes Program Recommendations  AACE/ADA: New Consensus Statement on Inpatient Glycemic Control (2013)  Target Ranges:  Prepandial:   less than 140 mg/dL      Peak postprandial:   less than 180 mg/dL (1-2 hours)      Critically ill patients:  140 - 180 mg/dL   Results for KALEAB, FRASIER (MRN [id number]) as of 12/20/2014 10:02  Ref. Range 12/19/2014 07:52 12/19/2014 18:33 12/19/2014 21:17 12/20/2014 08:10  Glucose-Capillary Latest Ref Range: 65-99 mg/dL 199 (H) 225 (H) 309 (H) 122 (H)    Current orders for Inpatient glycemic control: Lantus 8 units QHS, Novolog 0-15 units TID with meals, Novolog 0-5 units HS  Inpatient Diabetes Program Recommendations Insulin - Meal Coverage: Please consider ordering Novolog 4 units TID with meals for meal coverage (in addition to Novolog correction scale) if patient is eats at least 50% of meal.  Thanks, Barnie Alderman, RN, MSN, CCRN, CDE Diabetes Coordinator Inpatient Diabetes Program [phone] (Team Pager from [place] to [place]) [phone] ([facility] office) [phone] [facility] office) [phone] [facility] office)

## 2014-12-20 NOTE — Progress Notes (Addendum)
ANTICOAGULATION CONSULT NOTE - Follow Up Consult  Pharmacy Consult for Heparin / Coumadin Indication: atrial fibrillation   Labs:  Recent Labs  12/18/14 0719  12/19/14 0555 12/19/14 1043 12/19/14 1535 12/19/14 1632 12/20/14 0234 12/20/14 0239  HGB 9.0*  --  8.7* 8.5*  --  10.2* 8.8*  --   HCT 27.3*  --  27.1* 26.2*  --  30.0* 26.7*  --   PLT 387  --  392 373  --   --  350  --   LABPROT 17.9*  --  19.9*  --   --   --  24.5*  --   INR 1.47  --  1.69*  --   --   --  2.23*  --   HEPARINUNFRC  --   < > <0.10*  --  0.16*  --   --  0.33  CREATININE 4.87*  --  5.37* 5.78*  --  3.40* 3.90*  --   < > = values in this interval not displayed.    Assessment: 59 YOM on warfarin for hx Afib.INR elevated on admit, with hemoptysis, s/p Vit K 8/22. INR today = 2.23 (up from 1.69, is on both amiodarone and phenytoin)  Heparin to continue, HL = 0.33 this AM, therapeutic, but on low end  Goal of Therapy:  Heparin level 0.3-0.7 units/ml  INR = 2 to 3   Plan:  Continue heparin today at 1800 units / hr - will discontinue in AM if INR ok Coumadin 1 mg po x 1 dose today  Follow up AM labs  Added stop date for po vancomycin (1 week from 8/29)  Thank you Anette Guarneri, PharmD [phone]

## 2014-12-20 NOTE — Care Management Note (Signed)
Case Management Note  Patient Details  Name: KYIAN OBST MRN: [id number] Date of Birth: 1956/02/05  Subjective/Objective:    Patient received HD catherter on 8/29 and received first plasma pharesis on 8/30, which he gets daily, also on heparin gtt.  Most likely will be going to snf at dc.  Wife wants to see if CIR would be approved by [facility], doubt, CSW is aware for snf for back up.               Action/Plan:   Expected Discharge Date:                  Expected Discharge Plan:  Skilled Nursing Facility  In-House Referral:  Clinical Social Work  Discharge planning Services  CM Consult  Post Acute Care Choice:    Choice offered to:     DME Arranged:    DME Agency:     HH Arranged:    [place] Agency:     Status of Service:  In process, will continue to follow  Medicare Important Message Given:  Yes-third notification given Date Medicare IM Given:    Medicare IM give by:    Date Additional Medicare IM Given:    Additional Medicare Important Message give by:     If discussed at [place] of Stay Meetings, dates discussed:    Additional Comments:  [name], RN 12/20/2014, 2:24 PM

## 2014-12-20 NOTE — Progress Notes (Signed)
We continue to follow pt's progress to assist with dispo planning when pt medically ready. Continues plasmapheresis. [phone]

## 2014-12-21 LAB — RENAL FUNCTION PANEL
Albumin: 3.6 g/dL (ref 3.5–5.0)
Anion gap: 12 (ref 5–15)
BUN: 37 mg/dL — AB (ref 6–20)
CHLORIDE: 106 mmol/L (ref 101–111)
CO2: 17 mmol/L — AB (ref 22–32)
CREATININE: 4.93 mg/dL — AB (ref 0.61–1.24)
Calcium: 8.3 mg/dL — ABNORMAL LOW (ref 8.9–10.3)
GFR calc non Af Amer: 12 mL/min — ABNORMAL LOW (ref >60)
GFR, EST AFRICAN AMERICAN: 14 mL/min — AB (ref >60)
GLUCOSE: 100 mg/dL — AB (ref 65–99)
Phosphorus: 3.9 mg/dL (ref 2.5–4.6)
Potassium: 4.1 mmol/L (ref 3.5–5.1)
SODIUM: 135 mmol/L (ref 135–145)

## 2014-12-21 LAB — CBC
HCT: 25.8 % — ABNORMAL LOW (ref 39.0–52.0)
Hemoglobin: 8.3 g/dL — ABNORMAL LOW (ref 13.0–17.0)
MCH: 29.9 pg (ref 26.0–34.0)
MCHC: 32.2 g/dL (ref 30.0–36.0)
MCV: 92.8 fL (ref 78.0–100.0)
PLATELETS: 338 10*3/uL (ref 150–400)
RBC: 2.78 MIL/uL — AB (ref 4.22–5.81)
RDW: 20.7 % — ABNORMAL HIGH (ref 11.5–15.5)
WBC: 21.3 10*3/uL — ABNORMAL HIGH (ref 4.0–10.5)

## 2014-12-21 LAB — POCT I-STAT, CHEM 8
BUN: 17 mg/dL (ref 6–20)
CALCIUM ION: 1.19 mmol/L (ref 1.12–1.23)
CREATININE: 2.8 mg/dL — AB (ref 0.61–1.24)
Chloride: 97 mmol/L — ABNORMAL LOW (ref 101–111)
GLUCOSE: 97 mg/dL (ref 65–99)
HEMATOCRIT: 28 % — AB (ref 39.0–52.0)
HEMOGLOBIN: 9.5 g/dL — AB (ref 13.0–17.0)
Potassium: 2.8 mmol/L — ABNORMAL LOW (ref 3.5–5.1)
Sodium: 137 mmol/L (ref 135–145)
TCO2: 23 mmol/L (ref 0–100)

## 2014-12-21 LAB — GLUCOSE, CAPILLARY
Glucose-Capillary: 119 mg/dL — ABNORMAL HIGH (ref 65–99)
Glucose-Capillary: 81 mg/dL (ref 65–99)
Glucose-Capillary: 183 mg/dL — ABNORMAL HIGH (ref 65–99)

## 2014-12-21 LAB — HEPARIN LEVEL (UNFRACTIONATED): Heparin Unfractionated: 0.9 IU/mL — ABNORMAL HIGH (ref 0.30–0.70)

## 2014-12-21 LAB — PROTIME-INR
INR: 2.51 — AB (ref 0.00–1.49)
PROTHROMBIN TIME: 26.8 s — AB (ref 11.6–15.2)

## 2014-12-21 MED ORDER — ACD FORMULA A 0.73-2.45-2.2 GM/100ML VI SOLN
Status: AC
  Administered 2014-12-21: 16:00:00
  Filled 2014-12-21: qty 500

## 2014-12-21 MED ORDER — SODIUM CHLORIDE 0.9 % IV SOLN
INTRAVENOUS | Status: AC
  Administered 2014-12-21 (×4): via INTRAVENOUS_CENTRAL
  Filled 2014-12-21 (×4): qty 200

## 2014-12-21 MED ORDER — SODIUM CHLORIDE 0.9 % IV SOLN
4.0000 g | Freq: Once | INTRAVENOUS | Status: DC
  Filled 2014-12-21: qty 40

## 2014-12-21 MED ORDER — WARFARIN SODIUM 1 MG PO TABS
1.0000 mg | ORAL_TABLET | Freq: Once | ORAL | Status: AC
  Administered 2014-12-21: 1 mg via ORAL
  Filled 2014-12-21 (×2): qty 1

## 2014-12-21 MED ORDER — POTASSIUM CHLORIDE CRYS ER 20 MEQ PO TBCR
40.0000 meq | EXTENDED_RELEASE_TABLET | Freq: Once | ORAL | Status: AC
Start: 2014-12-21 — End: 2014-12-21
  Administered 2014-12-21: 40 meq via ORAL
  Filled 2014-12-21: qty 2

## 2014-12-21 MED ORDER — CALCIUM CARBONATE ANTACID 500 MG PO CHEW: 2.0000 | CHEWABLE_TABLET | ORAL | Status: DC

## 2014-12-21 MED ORDER — HEPARIN SODIUM (PORCINE) 1000 UNIT/ML IJ SOLN: 1000.0000 [IU] | Freq: Once | INTRAMUSCULAR | Status: DC

## 2014-12-21 MED ORDER — SODIUM CHLORIDE 0.9 % IJ SOLN
10.0000 mL | INTRAMUSCULAR | Status: DC | PRN
  Administered 2014-12-21 – 2014-12-23 (×2): 10 mL
  Filled 2014-12-21: qty 40

## 2014-12-21 MED ORDER — DIPHENHYDRAMINE HCL 25 MG PO CAPS: 25.0000 mg | ORAL_CAPSULE | Freq: Four times a day (QID) | ORAL | Status: DC | PRN

## 2014-12-21 MED ORDER — ACD FORMULA A 0.73-2.45-2.2 GM/100ML VI SOLN
500.0000 mL | Status: DC
  Administered 2014-12-21: 500 mL via INTRAVENOUS

## 2014-12-21 MED ORDER — ACD FORMULA A 0.73-2.45-2.2 GM/100ML VI SOLN
Status: AC
  Filled 2014-12-21: qty 500

## 2014-12-21 MED ORDER — CALCIUM CARBONATE ANTACID 500 MG PO CHEW
CHEWABLE_TABLET | ORAL | Status: AC
  Administered 2014-12-21: 400 mg
  Filled 2014-12-21: qty 2

## 2014-12-21 MED ORDER — ACETAMINOPHEN 325 MG PO TABS: 650.0000 mg | ORAL_TABLET | ORAL | Status: DC | PRN

## 2014-12-21 NOTE — Progress Notes (Signed)
ANTICOAGULATION CONSULT NOTE - Follow Up Consult  Pharmacy Consult for Heparin / Coumadin Indication: atrial fibrillation   Labs:  Recent Labs  12/19/14 0555  12/19/14 1535  12/20/14 0234 12/20/14 0239 12/20/14 1526 12/20/14 2325 12/21/14 0629  HGB 8.7*  < >  --   < > 8.8*  --  9.2* 8.5* 8.3*  HCT 27.1*  < >  --   < > 26.7*  --  27.0* 25.8* 25.8*  PLT 392  < >  --   --  350  --   --  322 338  LABPROT 19.9*  --   --   --  24.5*  --   --   --  26.8*  INR 1.69*  --   --   --  2.23*  --   --   --  2.51*  HEPARINUNFRC <0.10*  --  0.16*  --   --  0.33  --   --  0.90*  CREATININE 5.37*  < >  --   < > 3.90*  --  4.70* 4.80* 4.93*  < > = values in this interval not displayed.    Assessment: 59 YOM on warfarin for hx Afib.INR elevated on admit, with hemoptysis, s/p Vit K 8/22.  Home dose = 5mg  daily, but admitted with elevated INR  Heparin d/c'd with INR>2 on 9/1. Continues on coumadin INR therapeutic 2.23>>2.51, CBC stable. No bleed issues documented.  Goal of Therapy:  INR = 2 to 3   Plan:  - Coumadin 1 mg po x 1 dose today - Daily INR, mon s/sx bleeding   Elicia Lamp, PharmD Clinical Pharmacist Pager [phone] 12/21/2014 10:26 AM

## 2014-12-21 NOTE — Progress Notes (Signed)
PT Cancellation Note  Patient Details Name: Joseph [name] MRN: [id number] DOB: August 16, 1955   Cancelled Treatment:    Reason Eval/Treat Not Completed: Patient at procedure or test/unavailable   [name] 12/21/2014, 3:00 PM

## 2014-12-21 NOTE — Clinical Social Work Note (Signed)
CSW received consult for SNF, FL2 has been completed and on chart awaiting signature by physician.  CSW to continue to follow patient's discharge plan to CIR vs SNF.  Jones Broom. Stanton, MSW, [place] 12/21/2014 6:13 PM

## 2014-12-21 NOTE — Procedures (Signed)
I have seen and examined this patient and agree with the plan of care   Patient tolerating dialysis Limbic encephalitis -- treated with steroids and pheresis  ---  Day 4  Hemodialysis scheduled today will plan to challenge EDW     Wbc  21.3    Hb 8.3     K 4.1    CO2  17    Ca  8.3    INR 2.5     Joandry Slagter W 12/21/2014, 9:32 AM

## 2014-12-21 NOTE — Progress Notes (Signed)
[facility] PROGRESS NOTE  QUINT CHESTNUT GGY:[id number] DOB: October 17, 1955 DOA: 12/09/2014 PCP: Webb Silversmith, NP  Assessment/Plan: Sepsis secondary to healthcare associated pneumonia -Completed 7 day course of antibiotics on 8/28 . -Blood culture negative. Hemoptysis on presentation resolved. H&H stable. Pulmonary has since signed off..  -Given persistent leukocytosis to be chest x-ray ordered. Does not show new infiltrate. -Recommendation for follow-up chest x-ray in 4 weeks to see for resolution. -Stable  Persistent leukocytosis -Patient having off-and-on diarrhea. -Was treated with 2 weeks course of high-dose vancomycin during recent hospitalization. Repeat C. difficile negative. Dr. Clementeen Graham had spoken with Dr. Megan Salon on 8/29 who recommended to treat with oral vancomycin for one week. -Monitor WBC. Ordered blood culture, neg thus far. GI pathogen panel is pan-negative -Stable thus far. No fevers  Acute encephalopathy -Associated with underlying sepsis versus ongoing encephalitis. Patient was recently hospitalized for limbic encephalitis. MRI on admission showing persistent abnormal T2/FLAIR signal intensity involving mesial temporal lobes/hippocampi bilaterally which seems to have improved overall compared to MRI from 11/21/2014. -Patient remains confused off and on and occasionally hallucinating. EEG was repeated and is unremarkable for acute seizures. -Received IV Solu-Medrol for 5 days during recent hospitalization.  -LP was negative.  -Ultrasound of the scrotum to rule out testicular malignancy was negative. (As per neurology to rule out paraneoplastic encephalitis). -Neurology suggest this could be post infectious versus autoimmune encephalitis. -Recommendation for starting plasma exchange daily . HD catheter was placed on 8/29 and patient started plasma exchange on 8/30, second on 8/31, again on 9/2 -Clinically seems improved, however, pt seems to have residual short-term memory  deficits  A. fib with RVR -Noted after returning from dialysis on 8/22.  -CHADS 2 -Required Cardizem and amiodarone drip. Now on by mouth amiodarone .Remains off Cardizem drip On metoprolol twice a day. Currently rate controlled. -Coumadin resumed. -Echo from 08/2014 showed LVH with EF of 50-55%  ESRD on hemodialysis -Renal following.  -Scheduled HD to be continued -Nephrology following  History of seizure -Continue Dilantin.  Diabetes mellitus -Glucose improved after low-dose Lantus added.  Protein calorie malnutrition -Continue supplements  Code Status: Full Family Communication: Pt in room, wife at bedside Disposition Plan: Pending   Consultants:  Nephrology  Procedures:    Antibiotics:  IV vancomycin and Zosyn since 8/22-8/27  levaquin 8/27-8/28  PO vanc 8/29>>>  HPI/Subjective: Patient is without complaints. Seen on HD  Objective: Filed Vitals:   12/21/14 1515 12/21/14 1520 12/21/14 1530 12/21/14 1541  BP: 138/77 137/76 145/81 142/86  Pulse: 87 86 79 90  Temp: 98.5 F (36.9 C) 98.1 F (36.7 C)  98.3 F (36.8 C)  TempSrc: Oral Oral Oral Oral  Resp: 20 13 11 24   Height:      Weight:      SpO2:        Intake/Output Summary (Last 24 hours) at 12/21/14 1643 Last data filed at 12/21/14 1300  Gross per 24 hour  Intake    462 ml  Output   3510 ml  Net  -3048 ml   Filed Weights   12/18/14 0300 12/19/14 1023 12/19/14 1326  Weight: 85 kg (187 lb 6.3 oz) 84.5 kg (186 lb 4.6 oz) 81.2 kg (179 lb 0.2 oz)    Exam:   General:  Awake, laying in bed, in nad  Cardiovascular: regular, s1, s2  Respiratory: normal resp effort, no wheezing  Abdomen: soft, nondistended  Musculoskeletal: perfused, no clubbing, no cyanosis  Data Reviewed: Basic Metabolic Panel:  Recent Labs Lab 12/15/14  3546  12/19/14 0555 12/19/14 1043  12/20/14 0234 12/20/14 1526 12/20/14 2325 12/21/14 0629 12/21/14 1437  NA 129*  < > 129* 130*  < > 135 135 135 135  137  K 3.9  < > 4.3 4.0  < > 3.6 3.5 3.6 4.1 2.8*  CL 94*  < > 95* 99*  < > 102 98* 104 106 97*  CO2 24  < > 22 21*  --  23  --  21* 17*  --   GLUCOSE 292*  < > 191* 187*  < > 152* 223* 247* 100* 97  BUN 30*  < > 43* 44*  < > 26* 31* 31* 37* 17  CREATININE 4.47*  < > 5.37* 5.78*  < > 3.90* 4.70* 4.80* 4.93* 2.80*  CALCIUM 7.9*  < > 8.1* 8.3*  --  8.0*  --  8.0* 8.3*  --   MG 2.2  --   --   --   --   --   --   --   --   --   PHOS 3.3  < > 4.3 4.1  --  3.2  --  3.5 3.9  --   < > = values in this interval not displayed. Liver Function Tests:  Recent Labs Lab 12/19/14 0555 12/19/14 1043 12/20/14 0234 12/20/14 2325 12/21/14 0629  ALBUMIN 3.2* 2.9* 3.6 3.9 3.6   No results for input(s): LIPASE, AMYLASE in the last 168 hours. No results for input(s): AMMONIA in the last 168 hours. CBC:  Recent Labs Lab 12/19/14 0555 12/19/14 1043  12/20/14 0234 12/20/14 1526 12/20/14 2325 12/21/14 0629 12/21/14 1437  WBC 19.7* 19.5*  --  20.4*  --  20.8* 21.3*  --   HGB 8.7* 8.5*  < > 8.8* 9.2* 8.5* 8.3* 9.5*  HCT 27.1* 26.2*  < > 26.7* 27.0* 25.8* 25.8* 28.0*  MCV 92.8 92.3  --  92.4  --  93.5 92.8  --   PLT 392 373  --  350  --  322 338  --   < > = values in this interval not displayed. Cardiac Enzymes: No results for input(s): CKTOTAL, CKMB, CKMBINDEX, TROPONINI in the last 168 hours. BNP (last 3 results)  Recent Labs  11/05/14 2205  BNP 3626.3*    ProBNP (last 3 results)  Recent Labs  02/20/14 1515 02/24/14 1145 11/15/14 1622  PROBNP 15468.0* 10410.0* >4940.0*    CBG:  Recent Labs Lab 12/20/14 0810 12/20/14 1216 12/20/14 1712 12/20/14 2208 12/21/14 0758  GLUCAP 122* 162* 178* 234* 119*    Recent Results (from the past 240 hour(s))  C difficile quick scan w PCR reflex     Status: None   Collection Time: 12/17/14  7:46 PM  Result Value Ref Range Status   C Diff antigen NEGATIVE NEGATIVE Final   C Diff toxin NEGATIVE NEGATIVE Final   C Diff interpretation  Negative for toxigenic C. difficile  Final  Culture, blood (routine x 2)     Status: None (Preliminary result)   Collection Time: 12/18/14 10:37 AM  Result Value Ref Range Status   Specimen Description BLOOD RIGHT HAND  Final   Special Requests BOTTLES DRAWN AEROBIC ONLY 2CC  Final   Culture NO GROWTH 2 DAYS  Final   Report Status PENDING  Incomplete  Culture, blood (routine x 2)     Status: None (Preliminary result)   Collection Time: 12/18/14 12:15 PM  Result Value Ref Range Status   Specimen Description BLOOD  RIGHT HAND  Final   Special Requests BOTTLES DRAWN AEROBIC AND ANAEROBIC 5CC  Final   Culture NO GROWTH 2 DAYS  Final   Report Status PENDING  Incomplete     Studies: No results found.  Scheduled Meds: . amiodarone  400 mg Oral Daily  . atorvastatin  40 mg Oral QHS  . calcitRIOL  1.75 mcg Oral Q M,W,F-HD  . calcium carbonate  2 tablet Oral Q3H  . calcium gluconate IVPB  4 g Intravenous Once  . calcium gluconate IVPB  4 g Intravenous Once  . citrate dextrose      . citrate dextrose      . colchicine  0.6 mg Oral Daily  . darbepoetin (ARANESP) injection - DIALYSIS  100 mcg Intravenous Q Mon-HD  . diltiazem  30 mg Oral 4 times per day  . feeding supplement (NEPRO CARB STEADY)  237 mL Oral BID BM  . fluocinonide ointment   Topical QID  . guaiFENesin  600 mg Oral BID  . heparin  1,000 Units Intracatheter Once  . insulin aspart  0-15 Units Subcutaneous TID WC  . insulin aspart  0-5 Units Subcutaneous QHS  . insulin glargine  8 Units Subcutaneous QHS  . metoprolol tartrate  75 mg Oral BID  . multivitamin  1 tablet Oral QHS  . phenytoin  300 mg Oral q morning - 10a  . potassium chloride  40 mEq Oral Once  . saccharomyces boulardii  250 mg Oral BID  . sodium chloride  3 mL Intravenous Q12H  . vancomycin  250 mg Oral Q6H  . warfarin  1 mg Oral ONCE-1800  . Warfarin - Pharmacist Dosing Inpatient   Does not apply q1800   Continuous Infusions: . citrate dextrose       Principal Problem:   Sepsis Active Problems:   Atrial fibrillation-permanent   Essential hypertension   Gout   HCAP (healthcare-associated pneumonia)   ESRD on hemodialysis   Nausea vomiting and diarrhea   Pneumonia   Atrial fibrillation with RVR   Encephalopathy acute   Debility   Acute encephalopathy   Autoimmune encephalomyelitis    CHIU, STEPHEN K  [facility] Pager [phone]. If 7PM-7AM, please contact night-coverage at [email or web address], password [facility] 12/21/2014, 4:43 PM  LOS: 12 days

## 2014-12-21 NOTE — Progress Notes (Signed)
PT Cancellation Note  Patient Details Name: Joseph [name] MRN: [id number] DOB: August 09, 1955   Cancelled Treatment:    Reason Eval/Treat Not Completed: Patient at procedure or test/unavailable (pt at HD)   [facility] 12/21/2014, 12:04 PM  [facility] PT [phone]

## 2014-12-22 LAB — CBC
HCT: 23.9 % — ABNORMAL LOW (ref 39.0–52.0)
HEMATOCRIT: 25 % — AB (ref 39.0–52.0)
HEMOGLOBIN: 8.2 g/dL — AB (ref 13.0–17.0)
Hemoglobin: 7.8 g/dL — ABNORMAL LOW (ref 13.0–17.0)
MCH: 30.8 pg (ref 26.0–34.0)
MCH: 30.4 pg (ref 26.0–34.0)
MCHC: 32.8 g/dL (ref 30.0–36.0)
MCHC: 32.6 g/dL (ref 30.0–36.0)
MCV: 94 fL (ref 78.0–100.0)
MCV: 93 fL (ref 78.0–100.0)
Platelets: 289 10*3/uL (ref 150–400)
Platelets: 267 10*3/uL (ref 150–400)
RBC: 2.66 MIL/uL — ABNORMAL LOW (ref 4.22–5.81)
RBC: 2.57 MIL/uL — ABNORMAL LOW (ref 4.22–5.81)
RDW: 21.3 % — ABNORMAL HIGH (ref 11.5–15.5)
RDW: 21.3 % — ABNORMAL HIGH (ref 11.5–15.5)
WBC: 15.5 10*3/uL — ABNORMAL HIGH (ref 4.0–10.5)
WBC: 14.3 10*3/uL — ABNORMAL HIGH (ref 4.0–10.5)

## 2014-12-22 LAB — RENAL FUNCTION PANEL
ALBUMIN: 3.9 g/dL (ref 3.5–5.0)
ANION GAP: 8 (ref 5–15)
Albumin: 3.6 g/dL (ref 3.5–5.0)
Anion gap: 11 (ref 5–15)
BUN: 23 mg/dL — ABNORMAL HIGH (ref 6–20)
BUN: 26 mg/dL — ABNORMAL HIGH (ref 6–20)
CALCIUM: 8.6 mg/dL — AB (ref 8.9–10.3)
CO2: 23 mmol/L (ref 22–32)
CO2: 22 mmol/L (ref 22–32)
CREATININE: 4.35 mg/dL — AB (ref 0.61–1.24)
Calcium: 8.3 mg/dL — ABNORMAL LOW (ref 8.9–10.3)
Chloride: 105 mmol/L (ref 101–111)
Chloride: 101 mmol/L (ref 101–111)
Creatinine, Ser: 3.94 mg/dL — ABNORMAL HIGH (ref 0.61–1.24)
GFR, EST AFRICAN AMERICAN: 18 mL/min — AB (ref >60)
GFR, EST AFRICAN AMERICAN: 16 mL/min — AB (ref >60)
GFR, EST NON AFRICAN AMERICAN: 15 mL/min — AB (ref >60)
GFR, EST NON AFRICAN AMERICAN: 14 mL/min — AB (ref >60)
GLUCOSE: 168 mg/dL — AB (ref 65–99)
Glucose, Bld: 216 mg/dL — ABNORMAL HIGH (ref 65–99)
PHOSPHORUS: 3.4 mg/dL (ref 2.5–4.6)
POTASSIUM: 3.7 mmol/L (ref 3.5–5.1)
Phosphorus: 3.2 mg/dL (ref 2.5–4.6)
Potassium: 3.4 mmol/L — ABNORMAL LOW (ref 3.5–5.1)
SODIUM: 134 mmol/L — AB (ref 135–145)
Sodium: 136 mmol/L (ref 135–145)

## 2014-12-22 LAB — POCT I-STAT, CHEM 8
BUN: 13 mg/dL (ref 6–20)
CALCIUM ION: 1.13 mmol/L (ref 1.12–1.23)
CHLORIDE: 98 mmol/L — AB (ref 101–111)
CREATININE: 2.4 mg/dL — AB (ref 0.61–1.24)
GLUCOSE: 124 mg/dL — AB (ref 65–99)
HCT: 28 % — ABNORMAL LOW (ref 39.0–52.0)
Hemoglobin: 9.5 g/dL — ABNORMAL LOW (ref 13.0–17.0)
Potassium: 3.3 mmol/L — ABNORMAL LOW (ref 3.5–5.1)
Sodium: 139 mmol/L (ref 135–145)
TCO2: 26 mmol/L (ref 0–100)

## 2014-12-22 LAB — PROTIME-INR
INR: 1.79 — AB (ref 0.00–1.49)
Prothrombin Time: 20.7 seconds — ABNORMAL HIGH (ref 11.6–15.2)

## 2014-12-22 LAB — GLUCOSE, CAPILLARY
GLUCOSE-CAPILLARY: 145 mg/dL — AB (ref 65–99)
GLUCOSE-CAPILLARY: 172 mg/dL — AB (ref 65–99)
Glucose-Capillary: 144 mg/dL — ABNORMAL HIGH (ref 65–99)

## 2014-12-22 MED ORDER — SODIUM CHLORIDE 0.9 % IV SOLN: 100.0000 mL | INTRAVENOUS | Status: DC | PRN

## 2014-12-22 MED ORDER — ALBUMIN HUMAN 25 % IV SOLN
INTRAVENOUS | Status: AC
  Administered 2014-12-22 (×2): via INTRAVENOUS_CENTRAL
  Filled 2014-12-22 (×4): qty 200

## 2014-12-22 MED ORDER — HEPARIN SODIUM (PORCINE) 1000 UNIT/ML IJ SOLN
1000.0000 [IU] | Freq: Once | INTRAMUSCULAR | Status: DC
  Filled 2014-12-22: qty 1

## 2014-12-22 MED ORDER — DIPHENHYDRAMINE HCL 25 MG PO CAPS
25.0000 mg | ORAL_CAPSULE | Freq: Four times a day (QID) | ORAL | Status: DC | PRN
  Administered 2014-12-23 – 2014-12-24 (×2): 25 mg via ORAL
  Filled 2014-12-22 (×2): qty 1

## 2014-12-22 MED ORDER — CALCIUM CARBONATE ANTACID 500 MG PO CHEW: 2.0000 | CHEWABLE_TABLET | ORAL | Status: AC

## 2014-12-22 MED ORDER — SODIUM CHLORIDE 0.9 % IJ SOLN
10.0000 mL | INTRAMUSCULAR | Status: DC | PRN
  Administered 2014-12-23 – 2014-12-26 (×3): 10 mL
  Filled 2014-12-22 (×3): qty 40

## 2014-12-22 MED ORDER — ACETAMINOPHEN 325 MG PO TABS: 650.0000 mg | ORAL_TABLET | ORAL | Status: DC | PRN

## 2014-12-22 MED ORDER — PENTAFLUOROPROP-TETRAFLUOROETH EX AERO: 1.0000 "application " | INHALATION_SPRAY | CUTANEOUS | Status: DC | PRN

## 2014-12-22 MED ORDER — ACD FORMULA A 0.73-2.45-2.2 GM/100ML VI SOLN
Status: AC
  Filled 2014-12-22: qty 500

## 2014-12-22 MED ORDER — SODIUM CHLORIDE 0.9 % IV SOLN
Freq: Once | INTRAVENOUS | Status: DC
  Filled 2014-12-22 (×2): qty 200

## 2014-12-22 MED ORDER — CALCIUM CARBONATE ANTACID 500 MG PO CHEW
2.0000 | CHEWABLE_TABLET | ORAL | Status: AC
  Administered 2014-12-22: 400 mg via ORAL

## 2014-12-22 MED ORDER — WARFARIN SODIUM 2 MG PO TABS
2.0000 mg | ORAL_TABLET | Freq: Once | ORAL | Status: DC
  Filled 2014-12-22: qty 1

## 2014-12-22 MED ORDER — SODIUM CHLORIDE 0.9 % IJ SOLN
10.0000 mL | Freq: Two times a day (BID) | INTRAMUSCULAR | Status: DC
  Administered 2014-12-23: 10 mL
  Administered 2014-12-24: 20 mL
  Administered 2014-12-24: 10 mL

## 2014-12-22 MED ORDER — SODIUM CHLORIDE 0.9 % IV SOLN
4.0000 g | Freq: Once | INTRAVENOUS | Status: AC
  Administered 2014-12-22: 4 g via INTRAVENOUS
  Filled 2014-12-22: qty 40

## 2014-12-22 MED ORDER — ALTEPLASE 2 MG IJ SOLR
2.0000 mg | Freq: Once | INTRAMUSCULAR | Status: DC | PRN
  Filled 2014-12-22: qty 2

## 2014-12-22 MED ORDER — ACD FORMULA A 0.73-2.45-2.2 GM/100ML VI SOLN
500.0000 mL | Status: DC
  Filled 2014-12-22 (×2): qty 500

## 2014-12-22 MED ORDER — LIDOCAINE-PRILOCAINE 2.5-2.5 % EX CREA
1.0000 "application " | TOPICAL_CREAM | CUTANEOUS | Status: DC | PRN
  Filled 2014-12-22: qty 5

## 2014-12-22 MED ORDER — LIDOCAINE HCL (PF) 1 % IJ SOLN
5.0000 mL | INTRAMUSCULAR | Status: DC | PRN
  Filled 2014-12-22: qty 5

## 2014-12-22 MED ORDER — HEPARIN SODIUM (PORCINE) 1000 UNIT/ML DIALYSIS
1000.0000 [IU] | INTRAMUSCULAR | Status: DC | PRN
  Filled 2014-12-22: qty 1

## 2014-12-22 MED ORDER — CALCIUM CARBONATE ANTACID 500 MG PO CHEW
CHEWABLE_TABLET | ORAL | Status: AC
  Filled 2014-12-22: qty 4

## 2014-12-22 NOTE — Progress Notes (Signed)
[facility] PROGRESS NOTE  Joseph [name] MVE:[id number] DOB: [birth year] DOA: 12/09/2014 PCP: Webb Silversmith, NP  Assessment/Plan: Sepsis secondary to healthcare associated pneumonia -Completed 7 day course of antibiotics on 8/28 . -Blood culture negative. Hemoptysis on presentation resolved. H&H stable. Pulmonary has since signed off..  -Given persistent leukocytosis to be chest x-ray ordered. Does not show new infiltrate. -Recommendation for follow-up chest x-ray in 4 weeks to see for resolution. -Remains stable  Persistent leukocytosis -Patient having off-and-on diarrhea. -Was treated with 2 weeks course of high-dose vancomycin during recent hospitalization. Repeat C. difficile negative. Dr. Clementeen Graham had spoken with Dr. Megan Salon on 8/29 who recommended to treat with oral vancomycin for one week. -Monitor WBC. Ordered blood culture, neg thus far. GI pathogen panel is pan-negative -Remains stable thus far. No fevers  Acute encephalopathy -Associated with underlying sepsis versus ongoing encephalitis. Patient was recently hospitalized for limbic encephalitis. MRI on admission showing persistent abnormal T2/FLAIR signal intensity involving mesial temporal lobes/hippocampi bilaterally which seems to have improved overall compared to MRI from 11/21/2014. -Patient remains confused off and on and occasionally hallucinating. EEG was repeated and is unremarkable for acute seizures. -Received IV Solu-Medrol for 5 days during recent hospitalization.  -LP was negative.  -Ultrasound of the scrotum to rule out testicular malignancy was negative. (As per neurology to rule out paraneoplastic encephalitis). -Neurology suggest this could be post infectious versus autoimmune encephalitis. -Recommendation for starting plasma exchange daily . HD catheter was placed on 8/29 and patient started plasma exchange on 8/30, second on 8/31, again on 9/2 -Clinically seems improved. Patient continues to have  intermittent issues with short term memory   A. fib with RVR -Noted after returning from dialysis on 8/22.  -CHADS 2 -Required Cardizem and amiodarone drip. Now on by mouth amiodarone .Remains off Cardizem drip On metoprolol twice a day. Currently rate controlled. -Coumadin resumed. -Echo from 08/2014 showed LVH with EF of 50-55%  ESRD on hemodialysis -Renal following.  -Scheduled HD to be continued -Nephrology following  History of seizure -Continue Dilantin.  Diabetes mellitus -Glucose improved after low-dose Lantus added.  Protein calorie malnutrition -Continue supplements  Code Status: Full Family Communication: Pt in room, wife at bedside Disposition Plan: Pending   Consultants:  Nephrology  Procedures:    Antibiotics:  IV vancomycin and Zosyn since 8/22-8/27  levaquin 8/27-8/28  PO vanc 8/29>>>  HPI/Subjective: States feeling better but still having issues with memory  Objective: Filed Vitals:   12/22/14 1401 12/22/14 1430 12/22/14 1500 12/22/14 1530  BP: 115/81 127/79 126/80   Pulse: 91 95 95 98  Temp:      TempSrc:      Resp: 15 19 17 16   Height:      Weight:      SpO2:        Intake/Output Summary (Last 24 hours) at 12/22/14 1723 Last data filed at 12/22/14 1521  Gross per 24 hour  Intake    320 ml  Output      0 ml  Net    320 ml   Filed Weights   12/19/14 1023 12/19/14 1326 12/22/14 1352  Weight: 84.5 kg (186 lb 4.6 oz) 81.2 kg (179 lb 0.2 oz) 82.1 kg (181 lb)    Exam:   General:  Awake, sitting in chair, in nad  Cardiovascular: regular, s1, s2  Respiratory: normal resp effort, no wheezing  Abdomen: soft, nondistended  Musculoskeletal: perfused, no cyanosis  Data Reviewed: Basic Metabolic Panel:  Recent Labs Lab 12/19/14 1043  12/20/14 0234 12/20/14 1526 12/20/14 2325 12/21/14 0629 12/21/14 1437 12/22/14 0516  NA 130*  < > 135 135 135 135 137 136  K 4.0  < > 3.6 3.5 3.6 4.1 2.8* 3.7  CL 99*  < > 102 98*  104 106 97* 105  CO2 21*  --  23  --  21* 17*  --  23  GLUCOSE 187*  < > 152* 223* 247* 100* 97 168*  BUN 44*  < > 26* 31* 31* 37* 17 23*  CREATININE 5.78*  < > 3.90* 4.70* 4.80* 4.93* 2.80* 3.94*  CALCIUM 8.3*  --  8.0*  --  8.0* 8.3*  --  8.3*  PHOS 4.1  --  3.2  --  3.5 3.9  --  3.4  < > = values in this interval not displayed. Liver Function Tests:  Recent Labs Lab 12/19/14 1043 12/20/14 0234 12/20/14 2325 12/21/14 0629 12/22/14 0516  ALBUMIN 2.9* 3.6 3.9 3.6 3.9   No results for input(s): LIPASE, AMYLASE in the last 168 hours. No results for input(s): AMMONIA in the last 168 hours. CBC:  Recent Labs Lab 12/19/14 1043  12/20/14 0234 12/20/14 1526 12/20/14 2325 12/21/14 0629 12/21/14 1437 12/22/14 0516  WBC 19.5*  --  20.4*  --  20.8* 21.3*  --  15.5*  HGB 8.5*  < > 8.8* 9.2* 8.5* 8.3* 9.5* 8.2*  HCT 26.2*  < > 26.7* 27.0* 25.8* 25.8* 28.0* 25.0*  MCV 92.3  --  92.4  --  93.5 92.8  --  94.0  PLT 373  --  350  --  322 338  --  289  < > = values in this interval not displayed. Cardiac Enzymes: No results for input(s): CKTOTAL, CKMB, CKMBINDEX, TROPONINI in the last 168 hours. BNP (last 3 results)  Recent Labs  11/05/14 2205  BNP 3626.3*    ProBNP (last 3 results)  Recent Labs  02/20/14 1515 02/24/14 1145 11/15/14 1622  PROBNP 15468.0* 10410.0* >4940.0*    CBG:  Recent Labs Lab 12/21/14 0758 12/21/14 1640 12/21/14 2146 12/22/14 0739 12/22/14 1136  GLUCAP 119* 81 183* 145* 172*    Recent Results (from the past 240 hour(s))  C difficile quick scan w PCR reflex     Status: None   Collection Time: 12/17/14  7:46 PM  Result Value Ref Range Status   C Diff antigen NEGATIVE NEGATIVE Final   C Diff toxin NEGATIVE NEGATIVE Final   C Diff interpretation Negative for toxigenic C. difficile  Final  Culture, blood (routine x 2)     Status: None (Preliminary result)   Collection Time: 12/18/14 10:37 AM  Result Value Ref Range Status   Specimen  Description BLOOD RIGHT HAND  Final   Special Requests BOTTLES DRAWN AEROBIC ONLY 2CC  Final   Culture NO GROWTH 4 DAYS  Final   Report Status PENDING  Incomplete  Culture, blood (routine x 2)     Status: None (Preliminary result)   Collection Time: 12/18/14 12:15 PM  Result Value Ref Range Status   Specimen Description BLOOD RIGHT HAND  Final   Special Requests BOTTLES DRAWN AEROBIC AND ANAEROBIC 5CC  Final   Culture NO GROWTH 4 DAYS  Final   Report Status PENDING  Incomplete     Studies: No results found.  Scheduled Meds: . therapeutic plasma exchange solution   Dialysis Once in dialysis  . therapeutic plasma exchange solution   Dialysis Q1 Hr x 4  . amiodarone  400 mg Oral Daily  . atorvastatin  40 mg Oral QHS  . calcitRIOL  1.75 mcg Oral Q M,W,F-HD  . calcium carbonate      . calcium carbonate  2 tablet Oral Q3H  . calcium carbonate  2 tablet Oral Q3H  . calcium gluconate IVPB  4 g Intravenous Once  . citrate dextrose      . colchicine  0.6 mg Oral Daily  . darbepoetin (ARANESP) injection - DIALYSIS  100 mcg Intravenous Q Mon-HD  . diltiazem  30 mg Oral 4 times per day  . feeding supplement (NEPRO CARB STEADY)  237 mL Oral BID BM  . fluocinonide ointment   Topical QID  . guaiFENesin  600 mg Oral BID  . heparin  1,000 Units Intracatheter Once  . heparin  1,000 Units Intracatheter Once  . insulin aspart  0-15 Units Subcutaneous TID WC  . insulin aspart  0-5 Units Subcutaneous QHS  . insulin glargine  8 Units Subcutaneous QHS  . metoprolol tartrate  75 mg Oral BID  . multivitamin  1 tablet Oral QHS  . phenytoin  300 mg Oral q morning - 10a  . saccharomyces boulardii  250 mg Oral BID  . sodium chloride  10-40 mL Intracatheter Q12H  . sodium chloride  3 mL Intravenous Q12H  . vancomycin  250 mg Oral Q6H  . warfarin  2 mg Oral ONCE-1800  . Warfarin - Pharmacist Dosing Inpatient   Does not apply q1800   Continuous Infusions: . citrate dextrose      Principal  Problem:   Sepsis Active Problems:   Atrial fibrillation-permanent   Essential hypertension   Gout   HCAP (healthcare-associated pneumonia)   ESRD on hemodialysis   Nausea vomiting and diarrhea   Pneumonia   Atrial fibrillation with RVR   Encephalopathy acute   Debility   Acute encephalopathy   Autoimmune encephalomyelitis    CHIU, STEPHEN K  [facility] Pager [phone]. If 7PM-7AM, please contact night-coverage at [email or web address], password [facility] 12/22/2014, 5:23 PM  LOS: 13 days

## 2014-12-22 NOTE — Progress Notes (Signed)
[facility] ROUNDING NOTE   Subjective:   Interval History: sitting out of bed  Awake and alert no complaints this morning  Objective:  Vital signs in last 24 hours:  Temp:  [97.3 F (36.3 C)-98.8 F (37.1 C)] 98.7 F (37.1 C) (09/03 0559) Pulse Rate:  [73-107] 74 (09/03 0559) Resp:  [11-24] 18 (09/03 0559) BP: (91-146)/(42-89) 121/76 mmHg (09/03 0559) SpO2:  [97 %-99 %] 97 % (09/03 0559)  Weight change:  Filed Weights   12/18/14 0300 12/19/14 1023 12/19/14 1326  Weight: 85 kg (187 lb 6.3 oz) 84.5 kg (186 lb 4.6 oz) 81.2 kg (179 lb 0.2 oz)    Intake/Output: I/O last 3 completed shifts: In: 222 [P.O.:222] Out: 3510 [Other:3510]   Intake/Output this shift:  Total I/O In: 120 [P.O.:120] Out: -   CVS- irregular rate and rhythm RS- CTA some basal rales ABD- BS present soft non-distended EXT-  3 +  edema   Basic Metabolic Panel:  Recent Labs Lab 12/19/14 1043  12/20/14 0234 12/20/14 1526 12/20/14 2325 12/21/14 0629 12/21/14 1437 12/22/14 0516  NA 130*  < > 135 135 135 135 137 136  K 4.0  < > 3.6 3.5 3.6 4.1 2.8* 3.7  CL 99*  < > 102 98* 104 106 97* 105  CO2 21*  --  23  --  21* 17*  --  23  GLUCOSE 187*  < > 152* 223* 247* 100* 97 168*  BUN 44*  < > 26* 31* 31* 37* 17 23*  CREATININE 5.78*  < > 3.90* 4.70* 4.80* 4.93* 2.80* 3.94*  CALCIUM 8.3*  --  8.0*  --  8.0* 8.3*  --  8.3*  PHOS 4.1  --  3.2  --  3.5 3.9  --  3.4  < > = values in this interval not displayed.  Liver Function Tests:  Recent Labs Lab 12/19/14 1043 12/20/14 0234 12/20/14 2325 12/21/14 0629 12/22/14 0516  ALBUMIN 2.9* 3.6 3.9 3.6 3.9   No results for input(s): LIPASE, AMYLASE in the last 168 hours. No results for input(s): AMMONIA in the last 168 hours.  CBC:  Recent Labs Lab 12/19/14 1043  12/20/14 0234 12/20/14 1526 12/20/14 2325 12/21/14 0629 12/21/14 1437 12/22/14 0516  WBC 19.5*  --  20.4*  --  20.8* 21.3*  --  15.5*  HGB 8.5*  < > 8.8* 9.2* 8.5*  8.3* 9.5* 8.2*  HCT 26.2*  < > 26.7* 27.0* 25.8* 25.8* 28.0* 25.0*  MCV 92.3  --  92.4  --  93.5 92.8  --  94.0  PLT 373  --  350  --  322 338  --  289  < > = values in this interval not displayed.  Cardiac Enzymes: No results for input(s): CKTOTAL, CKMB, CKMBINDEX, TROPONINI in the last 168 hours.  BNP: Invalid input(s): POCBNP  CBG:  Recent Labs Lab 12/20/14 2208 12/21/14 0758 12/21/14 1640 12/21/14 2146 12/22/14 0739  GLUCAP 234* 119* 81 183* 145*    Microbiology: Results for orders placed or performed during the hospital encounter of 12/09/14  Blood Culture (routine x 2)     Status: None   Collection Time: 12/09/14  8:20 PM  Result Value Ref Range Status   Specimen Description BLOOD RIGHT WRIST  Final   Special Requests BOTTLES DRAWN AEROBIC AND ANAEROBIC 5CC  Final   Culture NO GROWTH 5 DAYS  Final   Report Status 12/14/2014 FINAL  Final  Blood Culture (routine x 2)  Status: None   Collection Time: 12/09/14  8:25 PM  Result Value Ref Range Status   Specimen Description BLOOD RIGHT HAND  Final   Special Requests BOTTLES DRAWN AEROBIC AND ANAEROBIC 4CC  Final   Culture NO GROWTH 5 DAYS  Final   Report Status 12/14/2014 FINAL  Final  C difficile quick scan w PCR reflex     Status: Abnormal   Collection Time: 12/10/14  4:42 AM  Result Value Ref Range Status   C Diff antigen POSITIVE (A) NEGATIVE Final   C Diff toxin NEGATIVE NEGATIVE Final   C Diff interpretation   Final    C. difficile present, but toxin not detected. This indicates colonization. In most cases, this does not require treatment. If patient has signs and symptoms consistent with colitis, consider treatment.  C difficile quick scan w PCR reflex     Status: None   Collection Time: 12/17/14  7:46 PM  Result Value Ref Range Status   C Diff antigen NEGATIVE NEGATIVE Final   C Diff toxin NEGATIVE NEGATIVE Final   C Diff interpretation Negative for toxigenic C. difficile  Final  Culture, blood (routine  x 2)     Status: None (Preliminary result)   Collection Time: 12/18/14 10:37 AM  Result Value Ref Range Status   Specimen Description BLOOD RIGHT HAND  Final   Special Requests BOTTLES DRAWN AEROBIC ONLY 2CC  Final   Culture NO GROWTH 3 DAYS  Final   Report Status PENDING  Incomplete  Culture, blood (routine x 2)     Status: None (Preliminary result)   Collection Time: 12/18/14 12:15 PM  Result Value Ref Range Status   Specimen Description BLOOD RIGHT HAND  Final   Special Requests BOTTLES DRAWN AEROBIC AND ANAEROBIC 5CC  Final   Culture NO GROWTH 3 DAYS  Final   Report Status PENDING  Incomplete    Coagulation Studies:  Recent Labs  12/20/14 0234 12/21/14 0629 12/22/14 0516  LABPROT 24.5* 26.8* 20.7*  INR 2.23* 2.51* 1.79*    Urinalysis: No results for input(s): COLORURINE, LABSPEC, PHURINE, GLUCOSEU, HGBUR, BILIRUBINUR, KETONESUR, PROTEINUR, UROBILINOGEN, NITRITE, LEUKOCYTESUR in the last 72 hours.  Invalid input(s): APPERANCEUR    Imaging: No results found.   Medications:     . amiodarone  400 mg Oral Daily  . atorvastatin  40 mg Oral QHS  . calcitRIOL  1.75 mcg Oral Q M,W,F-HD  . colchicine  0.6 mg Oral Daily  . darbepoetin (ARANESP) injection - DIALYSIS  100 mcg Intravenous Q Mon-HD  . diltiazem  30 mg Oral 4 times per day  . feeding supplement (NEPRO CARB STEADY)  237 mL Oral BID BM  . fluocinonide ointment   Topical QID  . guaiFENesin  600 mg Oral BID  . insulin aspart  0-15 Units Subcutaneous TID WC  . insulin aspart  0-5 Units Subcutaneous QHS  . insulin glargine  8 Units Subcutaneous QHS  . metoprolol tartrate  75 mg Oral BID  . multivitamin  1 tablet Oral QHS  . phenytoin  300 mg Oral q morning - 10a  . saccharomyces boulardii  250 mg Oral BID  . sodium chloride  3 mL Intravenous Q12H  . vancomycin  250 mg Oral Q6H  . Warfarin - Pharmacist Dosing Inpatient   Does not apply q1800   benzonatate, sodium chloride  Assessment/ Plan:   ESRD-  dialysis yesterday but still volume overloaded and will dialyze again today  ANEMIA- stable Hemoglobin in 8 range  --  Aranesp  MBD- taking calcitriol ca 8.3 and phos 4  HTN/VOL- volume overload -  Plan dialysis today as extra  Atrial fibrillation -- rate controlled and anticoagulated Limbic encephalitis  --- treated with pheresis   Sepsis   Wbc 15,000 ( decreased ) ---    Enterococcus urine 7/31   Ecoli urine  7/31   LOS: 13 Joseph [name] W @TODAY @9 :21 AM

## 2014-12-22 NOTE — Progress Notes (Signed)
ANTICOAGULATION CONSULT NOTE - Follow Up Consult  Pharmacy Consult for Coumadin Indication: atrial fibrillation   Labs:  Recent Labs  12/19/14 1535  12/20/14 0234 12/20/14 0239  12/20/14 2325 12/21/14 0629 12/21/14 1437 12/22/14 0516  HGB  --   < > 8.8*  --   < > 8.5* 8.3* 9.5* 8.2*  HCT  --   < > 26.7*  --   < > 25.8* 25.8* 28.0* 25.0*  PLT  --   --  350  --   --  322 338  --  289  LABPROT  --   --  24.5*  --   --   --  26.8*  --  20.7*  INR  --   --  2.23*  --   --   --  2.51*  --  1.79*  HEPARINUNFRC 0.16*  --   --  0.33  --   --  0.90*  --   --   CREATININE  --   < > 3.90*  --   < > 4.80* 4.93* 2.80* 3.94*  < > = values in this interval not displayed.    Assessment: 59 YOM on warfarin for hx Afib.INR elevated on admit, with hemoptysis, s/p Vit K 8/22.  Home dose = 5mg  daily, but admitted with elevated INR  INR 1.79 down from 2.51 after 1mg  dose yesterday. H/H stable. No reported bleeding.   Goal of Therapy:  INR 2-3   Plan:  - Coumadin 2 mg po x 1 dose today - Daily INR, mon s/sx bleeding   Stephens November, PharmD PGY1 Resident Pager [phone] 12/22/2014 10:40 AM

## 2014-12-23 DIAGNOSIS — G0481 Other encephalitis and encephalomyelitis: Secondary | ICD-10-CM

## 2014-12-23 LAB — GLUCOSE, CAPILLARY
GLUCOSE-CAPILLARY: 197 mg/dL — AB (ref 65–99)
GLUCOSE-CAPILLARY: 140 mg/dL — AB (ref 65–99)
GLUCOSE-CAPILLARY: 218 mg/dL — AB (ref 65–99)
Glucose-Capillary: 240 mg/dL — ABNORMAL HIGH (ref 65–99)

## 2014-12-23 LAB — RENAL FUNCTION PANEL
Albumin: 3.8 g/dL (ref 3.5–5.0)
Anion gap: 7 (ref 5–15)
BUN: 17 mg/dL (ref 6–20)
CHLORIDE: 107 mmol/L (ref 101–111)
CO2: 24 mmol/L (ref 22–32)
CREATININE: 3.09 mg/dL — AB (ref 0.61–1.24)
Calcium: 8.3 mg/dL — ABNORMAL LOW (ref 8.9–10.3)
GFR calc Af Amer: 24 mL/min — ABNORMAL LOW (ref >60)
GFR, EST NON AFRICAN AMERICAN: 21 mL/min — AB (ref >60)
Glucose, Bld: 236 mg/dL — ABNORMAL HIGH (ref 65–99)
POTASSIUM: 3.6 mmol/L (ref 3.5–5.1)
Phosphorus: 3.2 mg/dL (ref 2.5–4.6)
Sodium: 138 mmol/L (ref 135–145)

## 2014-12-23 LAB — CBC
HEMATOCRIT: 23.9 % — AB (ref 39.0–52.0)
Hemoglobin: 7.7 g/dL — ABNORMAL LOW (ref 13.0–17.0)
MCH: 30.2 pg (ref 26.0–34.0)
MCHC: 32.2 g/dL (ref 30.0–36.0)
MCV: 93.7 fL (ref 78.0–100.0)
PLATELETS: 229 10*3/uL (ref 150–400)
RBC: 2.55 MIL/uL — ABNORMAL LOW (ref 4.22–5.81)
RDW: 21.6 % — AB (ref 11.5–15.5)
WBC: 14.1 10*3/uL — AB (ref 4.0–10.5)

## 2014-12-23 LAB — CULTURE, BLOOD (ROUTINE X 2)
CULTURE: NO GROWTH
Culture: NO GROWTH

## 2014-12-23 LAB — PROTIME-INR
INR: 1.86 — ABNORMAL HIGH (ref 0.00–1.49)
Prothrombin Time: 21.4 seconds — ABNORMAL HIGH (ref 11.6–15.2)

## 2014-12-23 LAB — CALCIUM, IONIZED: CALCIUM, IONIZED, SERUM: 4.7 mg/dL (ref 4.5–5.6)

## 2014-12-23 MED ORDER — WARFARIN SODIUM 2 MG PO TABS
2.0000 mg | ORAL_TABLET | Freq: Once | ORAL | Status: AC
  Administered 2014-12-23: 2 mg via ORAL
  Filled 2014-12-23: qty 1

## 2014-12-23 MED ORDER — ALTEPLASE 2 MG IJ SOLR
2.0000 mg | Freq: Once | INTRAMUSCULAR | Status: DC | PRN
  Filled 2014-12-23: qty 2

## 2014-12-23 MED ORDER — LIDOCAINE-PRILOCAINE 2.5-2.5 % EX CREA
1.0000 "application " | TOPICAL_CREAM | CUTANEOUS | Status: DC | PRN
  Filled 2014-12-23: qty 5

## 2014-12-23 MED ORDER — HEPARIN SODIUM (PORCINE) 1000 UNIT/ML DIALYSIS
1000.0000 [IU] | INTRAMUSCULAR | Status: DC | PRN
  Filled 2014-12-23: qty 1

## 2014-12-23 MED ORDER — PENTAFLUOROPROP-TETRAFLUOROETH EX AERO: 1.0000 "application " | INHALATION_SPRAY | CUTANEOUS | Status: DC | PRN

## 2014-12-23 MED ORDER — NYSTATIN 100000 UNIT/GM EX POWD
Freq: Two times a day (BID) | CUTANEOUS | Status: DC
  Administered 2014-12-23 – 2014-12-26 (×5): via TOPICAL
  Filled 2014-12-23 (×2): qty 15

## 2014-12-23 MED ORDER — SODIUM CHLORIDE 0.9 % IV SOLN: 100.0000 mL | INTRAVENOUS | Status: DC | PRN

## 2014-12-23 MED ORDER — LIDOCAINE HCL (PF) 1 % IJ SOLN
5.0000 mL | INTRAMUSCULAR | Status: DC | PRN
  Filled 2014-12-23: qty 5

## 2014-12-23 NOTE — Progress Notes (Signed)
ANTICOAGULATION CONSULT NOTE - Follow Up Consult  Pharmacy Consult for Coumadin Indication: atrial fibrillation   Labs:  Recent Labs  12/21/14 0629  12/22/14 0516 12/22/14 1744 12/22/14 1745 12/22/14 1856 12/23/14 0545  HGB 8.3*  < > 8.2*  --  7.8* 9.5* 7.7*  HCT 25.8*  < > 25.0*  --  23.9* 28.0* 23.9*  PLT 338  --  289  --  267  --  229  LABPROT 26.8*  --  20.7*  --   --   --  21.4*  INR 2.51*  --  1.79*  --   --   --  1.86*  HEPARINUNFRC 0.90*  --   --   --   --   --   --   CREATININE 4.93*  < > 3.94* 4.35*  --  2.40* 3.09*  < > = values in this interval not displayed.    Assessment: 59 YOM on warfarin for hx Afib.INR elevated on admit, with hemoptysis, s/p Vit K 8/22.  Home dose = 5mg  daily, but admitted with elevated INR  INR 1.86 today up from 1.79. Ordered 2mg  warfarin yesterday however, was not administered due to patient being in dialysis per RN. States patient departed floor approximately 1500-1600 and did not return to 2200. H/H remains stable. No reported bleeding.   Goal of Therapy:  INR 2-3   Plan:  - Coumadin 2 mg po x 1 dose today - Daily INR, mon s/sx bleeding   Stephens November, PharmD PGY1 Resident Pager [phone] 12/23/2014 9:46 AM

## 2014-12-23 NOTE — Progress Notes (Signed)
NEURO HOSPITALIST PROGRESS NOTE   SUBJECTIVE:                                                                                                                        Sitting comfortable in a chair at the bedside. Complete 5/5 days PLEX. Stated that he feels " stronger and more clear mentally".   OBJECTIVE:                                                                                                                           Vital signs in last 24 hours: Temp:  [97.5 F (36.4 C)-98.5 F (36.9 C)] 98.5 F (36.9 C) (09/04 0503) Pulse Rate:  [66-109] 106 (09/04 0503) Resp:  [13-117] 14 (09/04 0503) BP: (91-127)/(49-86) 97/62 mmHg (09/04 0503) SpO2:  [96 %-100 %] 100 % (09/04 0503) Weight:  [79.8 kg (175 lb 14.8 oz)-82.1 kg (181 lb)] 79.8 kg (175 lb 14.8 oz) (09/03 2120)  Intake/Output from previous day: 09/03 0701 - 09/04 0700 In: 920 [P.O.:920] Out: 3000  Intake/Output this shift:   Nutritional status: Diet renal with fluid restriction Fluid restriction:: 1200 mL Fluid; Room service appropriate?: Yes; Fluid consistency:: Thin  Past Medical History  Diagnosis Date  . Atrial fibrillation     a. 11/18/2011 s/p DCCV - 150J;  b. Anticoagulation w/ Apixaban;  c. 11/2011 back in afib->asymptomatic.  . H/O alcohol abuse     a. drinks heavily on the weekends.  . Hypertension   . Diabetes mellitus   . Heart murmur     a. 09/2011 Echo: EF 55-60%, Triv AI, Mild MR, mildly dil LA.  . Diabetic peripheral neuropathy   . CKD (chronic kidney disease), stage III   . CHF (congestive heart failure)   . Pneumonia   . Sleep apnea      Neurologic Exam:  Neurologic Exam: General: Mental Status: Alert, oriented to hospital, not year or month, doesn't recall president's name. Speech fluent without evidence of aphasia. Able to follow 3 step commands without difficulty. Cranial Nerves: II: Visual fields grossly normal, pupils equal, round, reactive to  light and accommodation III,IV, VI: ptosis not present, extra-ocular motions intact bilaterally V,VII: smile symmetric, facial light touch sensation normal bilaterally VIII: hearing normal  bilaterally IX,X: uvula rises symmetrically XI: bilateral shoulder shrug XII: midline tongue extension without atrophy or fasciculations  Motor: Right :Upper extremity 5/5Left: Upper extremity 5/5 Lower extremity 4/5Lower extremity 4/5 Tone and bulk:normal tone throughout; no atrophy noted Sensory: Pinprick and light touch intact throughout, bilaterally Deep Tendon Reflexes:  2+ in bilateral UE and no KJ or AJ  Plantars: Right: downgoingLeft: downgoing  Lab Results: Lab Results  Component Value Date/Time   CHOL 99 02/23/2014 05:47 AM   Lipid Panel No results for input(s): CHOL, TRIG, HDL, CHOLHDL, VLDL, LDLCALC in the last 72 hours.  Studies/Results: No results found.  MEDICATIONS                                                                                                                        Scheduled: . therapeutic plasma exchange solution   Dialysis Once in dialysis  . amiodarone  400 mg Oral Daily  . atorvastatin  40 mg Oral QHS  . calcitRIOL  1.75 mcg Oral Q M,W,F-HD  . colchicine  0.6 mg Oral Daily  . darbepoetin (ARANESP) injection - DIALYSIS  100 mcg Intravenous Q Mon-HD  . diltiazem  30 mg Oral 4 times per day  . feeding supplement (NEPRO CARB STEADY)  237 mL Oral BID BM  . fluocinonide ointment   Topical QID  . guaiFENesin  600 mg Oral BID  . heparin  1,000 Units Intracatheter Once  . heparin  1,000 Units Intracatheter Once  . insulin aspart  0-15 Units Subcutaneous TID WC  . insulin aspart  0-5 Units Subcutaneous QHS  . insulin glargine  8 Units Subcutaneous QHS  . metoprolol tartrate  75 mg Oral BID  . multivitamin  1  tablet Oral QHS  . phenytoin  300 mg Oral q morning - 10a  . saccharomyces boulardii  250 mg Oral BID  . sodium chloride  10-40 mL Intracatheter Q12H  . sodium chloride  3 mL Intravenous Q12H  . vancomycin  250 mg Oral Q6H  . warfarin  2 mg Oral ONCE-1800  . Warfarin - Pharmacist Dosing Inpatient   Does not apply q1800    ASSESSMENT/PLAN:                                                                                                            59 year old male who presented with an encephalopathic state in early August and was found to have bilateral temporal changes concerning for limbic encephalitis. He had an extensive workup including both a CSF and serum paraneoplastic panel (  [facility] panel) which were negative. CSF infectious process was ruled out with LP. Repeat MRI showed changes that could represent injury or could represent ongoing encephalitis. Completed 5/5 days PLEX with some improvement. Hopefully he will continue to improve over the ensuing days/weeks. No further inpatient neurological intervention at this moment. Needs outpatient neurology follow up in 4-6 weeks. Neurology will sign off.  Dorian Pod, MD [facility] [phone]  12/23/2014, 8:17 AM

## 2014-12-23 NOTE — Clinical Social Work Note (Addendum)
Clinical Social Work Assessment  Patient Details  Name: Joseph [name] MRN: [id number] Date of Birth: April 11, 1956  Date of referral:  12/21/14               Reason for consult:  Facility Placement                Permission sought to share information with:  Family Supports Permission granted to share information::  Yes, Verbal Permission Granted  Name::        Agency::     Relationship::     Contact Information:     Housing/Transportation Living arrangements for the past 2 months:  Apartment Source of Information:  Patient Patient Interpreter Needed:  None Criminal Activity/Legal Involvement Pertinent to Current Situation/Hospitalization:  No - Comment as needed Significant Relationships:  Spouse (Patient stated that his wife would help with discharge planning but also stated that they may be working through a separation.) Lives with:  Self, Spouse Do you feel safe going back to the place where you live?  Yes Need for family participation in patient care:  Yes (Comment)  Care giving concerns: Patient states that he is more than willing to go to rehab. But after rehab he would like to return home to his wife. When asked he stated they were separated but he identified her as his primary support and they he would seek her to help with his SNF placement decisions.    Social Worker assessment / plan: CSW provided SNF list for 25 mile radius. Patient agreeable to being faxed out to [facility] for [employer].  Employment status:  Disabled (Comment on whether or not currently receiving Disability) Insurance information:  Medicare, Managed Medicare PT Recommendations:  [place] / Referral to community resources:  [place]  Patient/Family's Response to care: Patient open to rehab because he has a strong desire to get better.   Patient/Family's Understanding of and Emotional Response to Diagnosis, Current Treatment, and Prognosis: Patient is hope  about diagnosis and prognosis.   Emotional Assessment Appearance:  Appears stated age Attitude/Demeanor/Rapport:   (Pleasant) Affect (typically observed):  Accepting Orientation:  Oriented to Self, Oriented to Place, Oriented to  Time, Oriented to Situation Alcohol / Substance use:  Not Applicable Psych involvement (Current and /or in the community):  No (Comment)  Discharge Needs  Concerns to be addressed:  No discharge needs identified Readmission within the last 30 days:  Yes Current discharge risk:  None Barriers to Discharge:  No Barriers Identified   Joseph [name], Daneil Dolin, LCSW 12/23/2014, 2:50 PM

## 2014-12-23 NOTE — Progress Notes (Signed)
[facility] PROGRESS NOTE  Joseph [name] IRJ:[id number] DOB: 03-08-1956 DOA: 12/09/2014 PCP: Webb Silversmith, NP  Assessment/Plan: Sepsis secondary to healthcare associated pneumonia -Completed 7 day course of antibiotics on 8/28 . -Blood culture negative. Hemoptysis on presentation resolved. H&H stable. Pulmonary has since signed off..  -Given persistent leukocytosis to be chest x-ray ordered. Does not show new infiltrate. -Recommendation for follow-up chest x-ray in 4 weeks to see for resolution. -On min O2 support  Persistent leukocytosis -Patient having off-and-on diarrhea. -Was treated with 2 weeks course of high-dose vancomycin during recent hospitalization. Repeat C. difficile negative. Dr. Clementeen Graham had spoken with Dr. Megan Salon on 8/29 who recommended to treat with oral vancomycin for one week. -Monitor WBC. Ordered blood culture, neg thus far. GI pathogen panel is pan-negative -Remains stable thus far. No fevers -WBC trending down  Acute encephalopathy -Associated with underlying sepsis versus ongoing encephalitis. Patient was recently hospitalized for limbic encephalitis. MRI on admission showing persistent abnormal T2/FLAIR signal intensity involving mesial temporal lobes/hippocampi bilaterally which seems to have improved overall compared to MRI from 11/21/2014. -Patient remains confused off and on and occasionally hallucinating. EEG was repeated and is unremarkable for acute seizures. -Received IV Solu-Medrol for 5 days during recent hospitalization.  -LP was negative.  -Ultrasound of the scrotum to rule out testicular malignancy was negative. (As per neurology to rule out paraneoplastic encephalitis). -Neurology suggest this could be post infectious versus autoimmune encephalitis. -Recommendation for starting plasma exchange daily . HD catheter was placed on 8/29 and patient started plasma exchange on 8/30, second on 8/31, again on 9/2 -Clinically continues to improve. Patient  continues to have intermittent issues with short term memory   A. fib with RVR -Noted after returning from dialysis on 8/22.  -CHADS 2 -Required Cardizem and amiodarone drip. Now on by mouth amiodarone .Remains off Cardizem drip On metoprolol twice a day. Currently rate controlled. -Coumadin resumed. -Echo from 08/2014 showed LVH with EF of 50-55%  ESRD on hemodialysis -Renal following.  -Scheduled HD to be continued -Nephrology following  History of seizure -Continue Dilantin.  Diabetes mellitus -Glucose improved after low-dose Lantus added.  Protein calorie malnutrition -Continue supplements  Code Status: Full Family Communication: Pt in room, wife at bedside Disposition Plan: Pending   Consultants:  Nephrology  Procedures:    Antibiotics:  IV vancomycin and Zosyn since 8/22-8/27  levaquin 8/27-8/28  PO vanc 8/29>>>  HPI/Subjective: Reports feeling better today. Still having some memory issues, though  Objective: Filed Vitals:   12/22/14 2120 12/22/14 2126 12/23/14 0503 12/23/14 1432  BP:  110/65 97/62 93/66   Pulse:  66 106 103  Temp:  97.8 F (36.6 C) 98.5 F (36.9 C) 98.6 F (37 C)  TempSrc:  Oral Oral Oral  Resp:  20 14 18   Height:      Weight: 79.8 kg (175 lb 14.8 oz)     SpO2:  96% 100% 97%    Intake/Output Summary (Last 24 hours) at 12/23/14 1506 Last data filed at 12/23/14 0940  Gross per 24 hour  Intake   1040 ml  Output   3000 ml  Net  -1960 ml   Filed Weights   12/19/14 1326 12/22/14 1352 12/22/14 2120  Weight: 81.2 kg (179 lb 0.2 oz) 82.1 kg (181 lb) 79.8 kg (175 lb 14.8 oz)    Exam:   General:  Awake, sitting in chair, in nad  Cardiovascular: regular, s1, s2  Respiratory: normal resp effort, no wheezing  Abdomen: soft, nondistended  Musculoskeletal: perfused,  no cyanosis, no clubbing  Data Reviewed: Basic Metabolic Panel:  Recent Labs Lab 12/20/14 2325 12/21/14 0629 12/21/14 1437 12/22/14 0516  12/22/14 1744 12/22/14 1856 12/23/14 0545  NA 135 135 137 136 134* 139 138  K 3.6 4.1 2.8* 3.7 3.4* 3.3* 3.6  CL 104 106 97* 105 101 98* 107  CO2 21* 17*  --  23 22  --  24  GLUCOSE 247* 100* 97 168* 216* 124* 236*  BUN 31* 37* 17 23* 26* 13 17  CREATININE 4.80* 4.93* 2.80* 3.94* 4.35* 2.40* 3.09*  CALCIUM 8.0* 8.3*  --  8.3* 8.6*  --  8.3*  PHOS 3.5 3.9  --  3.4 3.2  --  3.2   Liver Function Tests:  Recent Labs Lab 12/20/14 2325 12/21/14 0629 12/22/14 0516 12/22/14 1744 12/23/14 0545  ALBUMIN 3.9 3.6 3.9 3.6 3.8   No results for input(s): LIPASE, AMYLASE in the last 168 hours. No results for input(s): AMMONIA in the last 168 hours. CBC:  Recent Labs Lab 12/20/14 2325 12/21/14 0629 12/21/14 1437 12/22/14 0516 12/22/14 1745 12/22/14 1856 12/23/14 0545  WBC 20.8* 21.3*  --  15.5* 14.3*  --  14.1*  HGB 8.5* 8.3* 9.5* 8.2* 7.8* 9.5* 7.7*  HCT 25.8* 25.8* 28.0* 25.0* 23.9* 28.0* 23.9*  MCV 93.5 92.8  --  94.0 93.0  --  93.7  PLT 322 338  --  289 267  --  229   Cardiac Enzymes: No results for input(s): CKTOTAL, CKMB, CKMBINDEX, TROPONINI in the last 168 hours. BNP (last 3 results)  Recent Labs  11/05/14 2205  BNP 3626.3*    ProBNP (last 3 results)  Recent Labs  02/20/14 1515 02/24/14 1145 11/15/14 1622  PROBNP 15468.0* 10410.0* >4940.0*    CBG:  Recent Labs Lab 12/22/14 0739 12/22/14 1136 12/22/14 2120 12/23/14 0756 12/23/14 1148  GLUCAP 145* 172* 144* 197* 140*    Recent Results (from the past 240 hour(s))  C difficile quick scan w PCR reflex     Status: None   Collection Time: 12/17/14  7:46 PM  Result Value Ref Range Status   C Diff antigen NEGATIVE NEGATIVE Final   C Diff toxin NEGATIVE NEGATIVE Final   C Diff interpretation Negative for toxigenic C. difficile  Final  Culture, blood (routine x 2)     Status: None   Collection Time: 12/18/14 10:37 AM  Result Value Ref Range Status   Specimen Description BLOOD RIGHT HAND  Final    Special Requests BOTTLES DRAWN AEROBIC ONLY 2CC  Final   Culture NO GROWTH 5 DAYS  Final   Report Status 12/23/2014 FINAL  Final  Culture, blood (routine x 2)     Status: None   Collection Time: 12/18/14 12:15 PM  Result Value Ref Range Status   Specimen Description BLOOD RIGHT HAND  Final   Special Requests BOTTLES DRAWN AEROBIC AND ANAEROBIC 5CC  Final   Culture NO GROWTH 5 DAYS  Final   Report Status 12/23/2014 FINAL  Final     Studies: No results found.  Scheduled Meds: . therapeutic plasma exchange solution   Dialysis Once in dialysis  . amiodarone  400 mg Oral Daily  . atorvastatin  40 mg Oral QHS  . calcitRIOL  1.75 mcg Oral Q M,W,F-HD  . colchicine  0.6 mg Oral Daily  . darbepoetin (ARANESP) injection - DIALYSIS  100 mcg Intravenous Q Mon-HD  . diltiazem  30 mg Oral 4 times per day  . feeding supplement (NEPRO  CARB STEADY)  237 mL Oral BID BM  . fluocinonide ointment   Topical QID  . guaiFENesin  600 mg Oral BID  . heparin  1,000 Units Intracatheter Once  . heparin  1,000 Units Intracatheter Once  . insulin aspart  0-15 Units Subcutaneous TID WC  . insulin aspart  0-5 Units Subcutaneous QHS  . insulin glargine  8 Units Subcutaneous QHS  . metoprolol tartrate  75 mg Oral BID  . multivitamin  1 tablet Oral QHS  . nystatin   Topical BID  . phenytoin  300 mg Oral q morning - 10a  . saccharomyces boulardii  250 mg Oral BID  . sodium chloride  10-40 mL Intracatheter Q12H  . sodium chloride  3 mL Intravenous Q12H  . vancomycin  250 mg Oral Q6H  . warfarin  2 mg Oral ONCE-1800  . Warfarin - Pharmacist Dosing Inpatient   Does not apply q1800   Continuous Infusions: . citrate dextrose      Principal Problem:   Sepsis Active Problems:   Atrial fibrillation-permanent   Essential hypertension   Gout   HCAP (healthcare-associated pneumonia)   ESRD on hemodialysis   Nausea vomiting and diarrhea   Pneumonia   Atrial fibrillation with RVR   Encephalopathy acute    Debility   Acute encephalopathy   Autoimmune encephalomyelitis    Rachele Lamaster K  [facility] Pager [phone]. If 7PM-7AM, please contact night-coverage at [email or web address], password [facility] 12/23/2014, 3:06 PM  LOS: 14 days

## 2014-12-23 NOTE — Progress Notes (Signed)
[facility] ROUNDING NOTE   Subjective:   Interval History: sitting out of bed being shaved  Objective:  Vital signs in last 24 hours:  Temp:  [97.5 F (36.4 C)-98.5 F (36.9 C)] 98.5 F (36.9 C) (09/04 0503) Pulse Rate:  [66-109] 106 (09/04 0503) Resp:  [13-117] 14 (09/04 0503) BP: (91-127)/(49-86) 97/62 mmHg (09/04 0503) SpO2:  [96 %-100 %] 100 % (09/04 0503) Weight:  [79.8 kg (175 lb 14.8 oz)-82.1 kg (181 lb)] 79.8 kg (175 lb 14.8 oz) (09/03 2120)  Weight change:  Filed Weights   12/19/14 1326 12/22/14 1352 12/22/14 2120  Weight: 81.2 kg (179 lb 0.2 oz) 82.1 kg (181 lb) 79.8 kg (175 lb 14.8 oz)    Intake/Output: I/O last 3 completed shifts: In: 920 [P.O.:920] Out: 3000 [Other:3000]   Intake/Output this shift:  Total I/O In: 240 [P.O.:240] Out: -   CVS- RRR RS- CTA ABD- BS present soft non-distended EXT- 3 edema   Basic Metabolic Panel:  Recent Labs Lab 12/20/14 2325 12/21/14 0629 12/21/14 1437 12/22/14 0516 12/22/14 1744 12/22/14 1856 12/23/14 0545  NA 135 135 137 136 134* 139 138  K 3.6 4.1 2.8* 3.7 3.4* 3.3* 3.6  CL 104 106 97* 105 101 98* 107  CO2 21* 17*  --  23 22  --  24  GLUCOSE 247* 100* 97 168* 216* 124* 236*  BUN 31* 37* 17 23* 26* 13 17  CREATININE 4.80* 4.93* 2.80* 3.94* 4.35* 2.40* 3.09*  CALCIUM 8.0* 8.3*  --  8.3* 8.6*  --  8.3*  PHOS 3.5 3.9  --  3.4 3.2  --  3.2    Liver Function Tests:  Recent Labs Lab 12/20/14 2325 12/21/14 0629 12/22/14 0516 12/22/14 1744 12/23/14 0545  ALBUMIN 3.9 3.6 3.9 3.6 3.8   No results for input(s): LIPASE, AMYLASE in the last 168 hours. No results for input(s): AMMONIA in the last 168 hours.  CBC:  Recent Labs Lab 12/20/14 2325 12/21/14 0629 12/21/14 1437 12/22/14 0516 12/22/14 1745 12/22/14 1856 12/23/14 0545  WBC 20.8* 21.3*  --  15.5* 14.3*  --  14.1*  HGB 8.5* 8.3* 9.5* 8.2* 7.8* 9.5* 7.7*  HCT 25.8* 25.8* 28.0* 25.0* 23.9* 28.0* 23.9*  MCV 93.5 92.8  --  94.0  93.0  --  93.7  PLT 322 338  --  289 267  --  229    Cardiac Enzymes: No results for input(s): CKTOTAL, CKMB, CKMBINDEX, TROPONINI in the last 168 hours.  BNP: Invalid input(s): POCBNP  CBG:  Recent Labs Lab 12/21/14 2146 12/22/14 0739 12/22/14 1136 12/22/14 2120 12/23/14 0756  GLUCAP 183* 145* 172* 144* 197*    Microbiology: Results for orders placed or performed during the hospital encounter of 12/09/14  Blood Culture (routine x 2)     Status: None   Collection Time: 12/09/14  8:20 PM  Result Value Ref Range Status   Specimen Description BLOOD RIGHT WRIST  Final   Special Requests BOTTLES DRAWN AEROBIC AND ANAEROBIC 5CC  Final   Culture NO GROWTH 5 DAYS  Final   Report Status 12/14/2014 FINAL  Final  Blood Culture (routine x 2)     Status: None   Collection Time: 12/09/14  8:25 PM  Result Value Ref Range Status   Specimen Description BLOOD RIGHT HAND  Final   Special Requests BOTTLES DRAWN AEROBIC AND ANAEROBIC 4CC  Final   Culture NO GROWTH 5 DAYS  Final   Report Status 12/14/2014 FINAL  Final  C  difficile quick scan w PCR reflex     Status: Abnormal   Collection Time: 12/10/14  4:42 AM  Result Value Ref Range Status   C Diff antigen POSITIVE (A) NEGATIVE Final   C Diff toxin NEGATIVE NEGATIVE Final   C Diff interpretation   Final    C. difficile present, but toxin not detected. This indicates colonization. In most cases, this does not require treatment. If patient has signs and symptoms consistent with colitis, consider treatment.  C difficile quick scan w PCR reflex     Status: None   Collection Time: 12/17/14  7:46 PM  Result Value Ref Range Status   C Diff antigen NEGATIVE NEGATIVE Final   C Diff toxin NEGATIVE NEGATIVE Final   C Diff interpretation Negative for toxigenic C. difficile  Final  Culture, blood (routine x 2)     Status: None (Preliminary result)   Collection Time: 12/18/14 10:37 AM  Result Value Ref Range Status   Specimen Description BLOOD  RIGHT HAND  Final   Special Requests BOTTLES DRAWN AEROBIC ONLY 2CC  Final   Culture NO GROWTH 4 DAYS  Final   Report Status PENDING  Incomplete  Culture, blood (routine x 2)     Status: None (Preliminary result)   Collection Time: 12/18/14 12:15 PM  Result Value Ref Range Status   Specimen Description BLOOD RIGHT HAND  Final   Special Requests BOTTLES DRAWN AEROBIC AND ANAEROBIC 5CC  Final   Culture NO GROWTH 4 DAYS  Final   Report Status PENDING  Incomplete    Coagulation Studies:  Recent Labs  12/21/14 0629 12/22/14 0516 12/23/14 0545  LABPROT 26.8* 20.7* 21.4*  INR 2.51* 1.79* 1.86*    Urinalysis: No results for input(s): COLORURINE, LABSPEC, PHURINE, GLUCOSEU, HGBUR, BILIRUBINUR, KETONESUR, PROTEINUR, UROBILINOGEN, NITRITE, LEUKOCYTESUR in the last 72 hours.  Invalid input(s): APPERANCEUR    Imaging: No results found.   Medications:   . citrate dextrose     . therapeutic plasma exchange solution   Dialysis Once in dialysis  . amiodarone  400 mg Oral Daily  . atorvastatin  40 mg Oral QHS  . calcitRIOL  1.75 mcg Oral Q M,W,F-HD  . colchicine  0.6 mg Oral Daily  . darbepoetin (ARANESP) injection - DIALYSIS  100 mcg Intravenous Q Mon-HD  . diltiazem  30 mg Oral 4 times per day  . feeding supplement (NEPRO CARB STEADY)  237 mL Oral BID BM  . fluocinonide ointment   Topical QID  . guaiFENesin  600 mg Oral BID  . heparin  1,000 Units Intracatheter Once  . heparin  1,000 Units Intracatheter Once  . insulin aspart  0-15 Units Subcutaneous TID WC  . insulin aspart  0-5 Units Subcutaneous QHS  . insulin glargine  8 Units Subcutaneous QHS  . metoprolol tartrate  75 mg Oral BID  . multivitamin  1 tablet Oral QHS  . nystatin   Topical BID  . phenytoin  300 mg Oral q morning - 10a  . saccharomyces boulardii  250 mg Oral BID  . sodium chloride  10-40 mL Intracatheter Q12H  . sodium chloride  3 mL Intravenous Q12H  . vancomycin  250 mg Oral Q6H  . warfarin  2 mg Oral  ONCE-1800  . Warfarin - Pharmacist Dosing Inpatient   Does not apply q1800   sodium chloride, sodium chloride, acetaminophen, alteplase, benzonatate, diphenhydrAMINE, heparin, lidocaine (PF), lidocaine-prilocaine, pentafluoroprop-tetrafluoroeth, sodium chloride, sodium chloride  Assessment/ Plan:   ESRD- dialysis yesterday but  still volume overloaded and will dialyze again tomorrow  ANEMIA- stable Hemoglobin in 7.7 range -- Aranesp  MBD- taking calcitriol ca 8.3 and phos  3.2  HTN/VOL- volume overload - Plan dialysis tomorrow  Atrial fibrillation -- rate controlled and anticoagulated Limbic encephalitis --- treated with pheresis  Sepsis Wbc 15,000 ( decreased ) --- Enterococcus urine 7/31 Ecoli urine 7/31    LOS: 14 Joseph [name] W @TODAY @11 :25 AM

## 2014-12-24 LAB — RENAL FUNCTION PANEL
ANION GAP: 9 (ref 5–15)
Albumin: 3.6 g/dL (ref 3.5–5.0)
Albumin: 3.2 g/dL — ABNORMAL LOW (ref 3.5–5.0)
Anion gap: 9 (ref 5–15)
BUN: 28 mg/dL — ABNORMAL HIGH (ref 6–20)
BUN: 34 mg/dL — ABNORMAL HIGH (ref 6–20)
CALCIUM: 8.6 mg/dL — AB (ref 8.9–10.3)
CHLORIDE: 106 mmol/L (ref 101–111)
CHLORIDE: 105 mmol/L (ref 101–111)
CO2: 22 mmol/L (ref 22–32)
CO2: 22 mmol/L (ref 22–32)
CREATININE: 4.54 mg/dL — AB (ref 0.61–1.24)
Calcium: 8.3 mg/dL — ABNORMAL LOW (ref 8.9–10.3)
Creatinine, Ser: 4.88 mg/dL — ABNORMAL HIGH (ref 0.61–1.24)
GFR calc Af Amer: 15 mL/min — ABNORMAL LOW (ref >60)
GFR calc non Af Amer: 13 mL/min — ABNORMAL LOW (ref >60)
GFR calc non Af Amer: 12 mL/min — ABNORMAL LOW (ref >60)
GFR, EST AFRICAN AMERICAN: 14 mL/min — AB (ref >60)
GLUCOSE: 92 mg/dL (ref 65–99)
Glucose, Bld: 277 mg/dL — ABNORMAL HIGH (ref 65–99)
Phosphorus: 3.6 mg/dL (ref 2.5–4.6)
Phosphorus: 3.7 mg/dL (ref 2.5–4.6)
Potassium: 3.7 mmol/L (ref 3.5–5.1)
Potassium: 3.7 mmol/L (ref 3.5–5.1)
SODIUM: 137 mmol/L (ref 135–145)
Sodium: 136 mmol/L (ref 135–145)

## 2014-12-24 LAB — CBC WITH DIFFERENTIAL/PLATELET
BASOS PCT: 1 % (ref 0–1)
Basophils Absolute: 0.2 10*3/uL — ABNORMAL HIGH (ref 0.0–0.1)
EOS ABS: 0.2 10*3/uL (ref 0.0–0.7)
EOS PCT: 1 % (ref 0–5)
HEMATOCRIT: 22.2 % — AB (ref 39.0–52.0)
Hemoglobin: 7 g/dL — ABNORMAL LOW (ref 13.0–17.0)
LYMPHS PCT: 16 % (ref 12–46)
Lymphs Abs: 2.5 10*3/uL (ref 0.7–4.0)
MCH: 30 pg (ref 26.0–34.0)
MCHC: 31.5 g/dL (ref 30.0–36.0)
MCV: 95.3 fL (ref 78.0–100.0)
Monocytes Absolute: 1.3 10*3/uL — ABNORMAL HIGH (ref 0.1–1.0)
Monocytes Relative: 8 % (ref 3–12)
NEUTROS PCT: 74 % (ref 43–77)
Neutro Abs: 11.7 10*3/uL — ABNORMAL HIGH (ref 1.7–7.7)
PLATELETS: 202 10*3/uL (ref 150–400)
RBC: 2.33 MIL/uL — AB (ref 4.22–5.81)
RDW: 22.4 % — ABNORMAL HIGH (ref 11.5–15.5)
WBC: 15.9 10*3/uL — AB (ref 4.0–10.5)

## 2014-12-24 LAB — GLUCOSE, CAPILLARY
GLUCOSE-CAPILLARY: 102 mg/dL — AB (ref 65–99)
Glucose-Capillary: 80 mg/dL (ref 65–99)
Glucose-Capillary: 177 mg/dL — ABNORMAL HIGH (ref 65–99)
Glucose-Capillary: 98 mg/dL (ref 65–99)

## 2014-12-24 LAB — PROTIME-INR
INR: 1.49 (ref 0.00–1.49)
Prothrombin Time: 18.1 seconds — ABNORMAL HIGH (ref 11.6–15.2)

## 2014-12-24 MED ORDER — DARBEPOETIN ALFA 150 MCG/0.3ML IJ SOSY
150.0000 ug | PREFILLED_SYRINGE | INTRAMUSCULAR | Status: DC
  Administered 2014-12-24: 150 ug via INTRAVENOUS
  Filled 2014-12-24 (×2): qty 0.3

## 2014-12-24 MED ORDER — WARFARIN SODIUM 4 MG PO TABS
4.0000 mg | ORAL_TABLET | Freq: Once | ORAL | Status: AC
  Administered 2014-12-24: 4 mg via ORAL
  Filled 2014-12-24 (×2): qty 1

## 2014-12-24 NOTE — Progress Notes (Signed)
Subjective:   Tired from therapy this morning but feels well. Wife concerned about LE edema.  Objective Filed Vitals:   12/24/14 0056 12/24/14 0555 12/24/14 0645 12/24/14 1016  BP: 102/63 111/69  110/68  Pulse: 82 78  84  Temp:   98.1 F (36.7 C)   TempSrc:  Oral Oral   Resp: 16 16    Height:      Weight:      SpO2: 100% 100%     Physical Exam General: sitting in chair. Alert. No acute distress.  Heart: RRR  Lungs: CTA, unlabored Abdomen: soft, nontender +BS  Extremities: +LE edema Dialysis Access:  L AVG +b/t   R IJ Cath placed for plasma exchanges  Dialysis Orders: [facility]WF [facility] 4 hrs 80kgs 3K/2.5Ca No Heparin Profile 2 Micera 100 q 2 weeks- to start 8/24 Calcitriol 1.75  Assessment/Plan: 1. Sepsis- off antibiotics. Needs repeat chest xray in 4 weeks. On po vanc for cdiff 2. Acute encephalopathy- improving. S/p plasma exchanges- last 9/3 2. ESRD - MWF [facility]. HD pending today- run longer to attempt more volume 3. Anemia - aranesp q Monday- hgb 7.7- increase dose.  4. Secondary hyperparathyroidism - ca+ 8.6/phos 3.6. Cont calcitriol.  5. HTN/volume - LE edema/BP 90s-110s- at edw, lower as BP tolerates 6. Nutrition - appetite improving. Alb 3.6.renal diet.  7 afib- on coumadin and amiodarone 8. Seizure- dilantin 9. DM  Joseph Iron, NP [facility] Beeper [phone] 12/24/2014,10:27 AM  LOS: 15 days   Pt seen, examined, agree w assess/plan as above with additions as indicated. Weights are coming down, is down 8 kg from one week ago and UF's are averaging 3kg per HD.  Continue to lower volume, he has another 10-15kg to go.   Kelly Splinter MD pager [phone]    cell [phone] 12/24/2014, 2:18 PM     Additional Objective Labs: Basic Metabolic Panel:  Recent Labs Lab 12/22/14 1744 12/22/14 1856 12/23/14 0545 12/24/14 0600  NA 134* 139 138 137  K 3.4* 3.3* 3.6 3.7  CL 101 98* 107 106  CO2 22  --  24 22  GLUCOSE 216* 124* 236* 92  BUN 26* 13  17 28*  CREATININE 4.35* 2.40* 3.09* 4.54*  CALCIUM 8.6*  --  8.3* 8.6*  PHOS 3.2  --  3.2 3.6   Liver Function Tests:  Recent Labs Lab 12/22/14 1744 12/23/14 0545 12/24/14 0600  ALBUMIN 3.6 3.8 3.6   No results for input(s): LIPASE, AMYLASE in the last 168 hours. CBC:  Recent Labs Lab 12/20/14 2325 12/21/14 0629  12/22/14 0516 12/22/14 1745 12/22/14 1856 12/23/14 0545  WBC 20.8* 21.3*  --  15.5* 14.3*  --  14.1*  HGB 8.5* 8.3*  < > 8.2* 7.8* 9.5* 7.7*  HCT 25.8* 25.8*  < > 25.0* 23.9* 28.0* 23.9*  MCV 93.5 92.8  --  94.0 93.0  --  93.7  PLT 322 338  --  289 267  --  229  < > = values in this interval not displayed. Blood Culture    Component Value Date/Time   SDES BLOOD RIGHT HAND 12/18/2014 1215   SPECREQUEST BOTTLES DRAWN AEROBIC AND ANAEROBIC 5CC 12/18/2014 1215   CULT NO GROWTH 5 DAYS 12/18/2014 1215   REPTSTATUS 12/23/2014 FINAL 12/18/2014 1215    Cardiac Enzymes: No results for input(s): CKTOTAL, CKMB, CKMBINDEX, TROPONINI in the last 168 hours. CBG:  Recent Labs Lab 12/23/14 0756 12/23/14 1148 12/23/14 1650 12/23/14 2127 12/24/14 0808  GLUCAP 197* 140* 240*  218* 80   [name] Studies: No results for input(s): [name], TIBC, TRANSFERRIN, FERRITIN in the last 72 hours. @lablastinr3 @ Studies/Results: No results found. Medications: . citrate dextrose     . therapeutic plasma exchange solution   Dialysis Once in dialysis  . amiodarone  400 mg Oral Daily  . atorvastatin  40 mg Oral QHS  . calcitRIOL  1.75 mcg Oral Q M,W,F-HD  . colchicine  0.6 mg Oral Daily  . darbepoetin (ARANESP) injection - DIALYSIS  100 mcg Intravenous Q Mon-HD  . diltiazem  30 mg Oral 4 times per day  . feeding supplement (NEPRO CARB STEADY)  237 mL Oral BID BM  . fluocinonide ointment   Topical QID  . guaiFENesin  600 mg Oral BID  . heparin  1,000 Units Intracatheter Once  . heparin  1,000 Units Intracatheter Once  . insulin aspart  0-15 Units Subcutaneous TID WC  . insulin  aspart  0-5 Units Subcutaneous QHS  . insulin glargine  8 Units Subcutaneous QHS  . metoprolol tartrate  75 mg Oral BID  . multivitamin  1 tablet Oral QHS  . nystatin   Topical BID  . phenytoin  300 mg Oral q morning - 10a  . saccharomyces boulardii  250 mg Oral BID  . sodium chloride  10-40 mL Intracatheter Q12H  . sodium chloride  3 mL Intravenous Q12H  . warfarin  4 mg Oral ONCE-1800  . Warfarin - Pharmacist Dosing Inpatient   Does not apply [phone]

## 2014-12-24 NOTE — Progress Notes (Signed)
Inpatient Rehabilitation  Pt. Currently in HD.  Note pt. with increasing creatinine over last few days.  Pt. has completed PLEX.  We await medical readiness to determine if pt. will continue to have need for IP Rehab.  Will follow up tomorrow.    [place] Admissions Coordinator Cell [phone] Office [phone]

## 2014-12-24 NOTE — Progress Notes (Signed)
ANTICOAGULATION CONSULT NOTE - Follow Up Consult  Pharmacy Consult for Coumadin Indication: atrial fibrillation   Labs:  Recent Labs  12/22/14 0516  12/22/14 1745 12/22/14 1856 12/23/14 0545 12/24/14 0600  HGB 8.2*  --  7.8* 9.5* 7.7*  --   HCT 25.0*  --  23.9* 28.0* 23.9*  --   PLT 289  --  267  --  229  --   LABPROT 20.7*  --   --   --  21.4* 18.1*  INR 1.79*  --   --   --  1.86* 1.49  CREATININE 3.94*  < >  --  2.40* 3.09* 4.54*  < > = values in this interval not displayed.    Assessment: 59 YOM on warfarin for hx Afib.INR elevated on admit, with hemoptysis, s/p Vit K 8/22.  Home dose = 5mg  daily, but admitted with elevated INR  INR 1.49 today down from 1.86 after 2mg  dose yesterday and 9/3 dose being missed due to patient being in hemodialysis. H/H remains stable. No reported bleeding.   Goal of Therapy:  INR 2-3   Plan:  - Coumadin 4 mg po x 1 dose today - Daily INR, mon s/sx bleeding   Stephens November, PharmD PGY1 Resident Pager [phone] 12/24/2014 8:21 AM

## 2014-12-24 NOTE — Progress Notes (Signed)
Physical Therapy Treatment Patient Details Name: Joseph [name] MRN: [id number] DOB: 1956-03-28 Today's Date: 12/24/2014    History of Present Illness 59 yo male readmission with fever cough HCAP, anemia, and MRI abnormal t2-flair involving temporal lobes/ hippocami bilaterally with question of trace diffusion abnormally within R hippocampus. MRI reveals remote L PCA infarct. Pt with recent d/c 11/30/14 PMH: CDIFF, limbic encephalitis, ESRD, DM, CHF, CKD, PNA, HTN, cardioversion, AV fistula L forearm, R heart catheterization    PT Comments    Patient up in recliner upon entering room. Patient stating that he has been wanting to walk and get stronger. Patient intially moving quickly without regards to his fatigue. He fatigued quickly but was able to walk out in the hall with Min A due to some unsteadiness, especially with turns. Wife came into room while patient was walking and she was very pleased at his progress. She continues to be hopeful for CIR. Continue to recommend comprehensive inpatient rehab (CIR) for post-acute therapy needs.   Follow Up Recommendations  CIR     Equipment Recommendations  None recommended by PT    Recommendations for Other Services       Precautions / Restrictions Precautions Precautions: Fall    Mobility  Bed Mobility               General bed mobility comments: Patient in chair as PT entered room.  Transfers Overall transfer level: Needs assistance Equipment used: Rolling walker (2 wheeled)   Sit to Stand: Mod assist         General transfer comment: Mod A to power up into standing this session. Cues for rocking technique and safe hand placement with standing.   Ambulation/Gait Ambulation/Gait assistance: Min assist Ambulation Distance (Feet): 90 Feet Assistive device: Rolling walker (2 wheeled) Gait Pattern/deviations: Step-through pattern;Decreased stride length;Trunk flexed Gait velocity: decreased   General Gait Details: Cues to  take time and not rush ambulation as it causes him to be unsteady. Patient required Min A with turns to maintain balance and safe use of RW   Stairs            Wheelchair Mobility    Modified Rankin (Stroke Patients Only)       Balance                                    Cognition Arousal/Alertness: Awake/alert Behavior During Therapy: WFL for tasks assessed/performed     Orientation Level: Disoriented to;Situation;Time     Following Commands: Follows one step commands with increased time Safety/Judgement: Decreased awareness of safety          Exercises General Exercises - Lower Extremity Long Arc Quad: AROM;Both;15 reps Hip Flexion/Marching: AROM;Both;15 reps    General Comments        Pertinent Vitals/Pain Pain Assessment: No/denies pain    Home Living                      Prior Function            PT Goals (current goals can now be found in the care plan section) Progress towards PT goals: Progressing toward goals    Frequency  Min 3X/week    PT Plan Current plan remains appropriate    Co-evaluation             End of Session Equipment Utilized During Treatment: Gait belt Activity Tolerance: Patient  limited by fatigue Patient left: in chair;with call bell/phone within reach;with chair alarm set;with family/visitor present     Time: 9458-5929 PT Time Calculation (min) (ACUTE ONLY): 20 min  Charges:  $Gait Training: 8-22 mins                    G Codes:      Jacqualyn Posey 12/24/2014, 9:22 AM 12/24/2014 Jacqualyn Posey PTA [phone] pager [phone] office

## 2014-12-24 NOTE — Progress Notes (Signed)
[facility] PROGRESS NOTE  QUANTAVIS OBRYANT VOJ:[id number] DOB: [birth year] DOA: 12/09/2014 PCP: Webb Silversmith, NP  Assessment/Plan: Sepsis secondary to healthcare associated pneumonia -Completed 7 day course of antibiotics on 8/28 . -Blood culture negative. Hemoptysis on presentation resolved. H&H stable. Pulmonary has since signed off..  -Given persistent leukocytosis to be chest x-ray ordered. Does not show new infiltrate. -Recommendation for follow-up chest x-ray in 4 weeks to see for resolution. -On min O2 support  Persistent leukocytosis -Patient having off-and-on diarrhea. -Was treated with 2 weeks course of high-dose vancomycin during recent hospitalization. Repeat C. difficile negative. Dr. Clementeen Graham had spoken with Dr. Megan Salon on 8/29 who recommended to treat with oral vancomycin for one week. -Monitor WBC. Ordered blood culture, neg thus far. GI pathogen panel is pan-negative -Remains stable thus far. No fevers -WBC consistently trending down  Acute encephalopathy -Associated with underlying sepsis versus ongoing encephalitis. Patient was recently hospitalized for limbic encephalitis. MRI on admission showing persistent abnormal T2/FLAIR signal intensity involving mesial temporal lobes/hippocampi bilaterally which seems to have improved overall compared to MRI from 11/21/2014. -Patient remains confused off and on and occasionally hallucinating. EEG was repeated and is unremarkable for acute seizures. -Received IV Solu-Medrol for 5 days during recent hospitalization.  -LP was negative.  -Ultrasound of the scrotum to rule out testicular malignancy was negative. (As per neurology to rule out paraneoplastic encephalitis). -Neurology suggest this could be post infectious versus autoimmune encephalitis. -Recommendation for starting plasma exchange daily . HD catheter was placed on 8/29 and patient started plasma exchange on 8/30, second on 8/31, again on 9/2 -Clinically continues to  improve. Seems to be thinking better today and able to carry a more appropriate conversation. Family at bedside agrees.  A. fib with RVR -Noted after returning from dialysis on 8/22.  -CHADS 2 -Required Cardizem and amiodarone drip. Now on by mouth amiodarone .Remains off Cardizem drip On metoprolol twice a day. Currently rate controlled. -Coumadin resumed. -Echo from 08/2014 showed LVH with EF of 50-55%  ESRD on hemodialysis -Renal following.  -Scheduled HD to be continued -Nephrology following  History of seizure -Continue Dilantin.  Diabetes mellitus -Glucose improved after low-dose Lantus added.  Protein calorie malnutrition -Continue supplements  Code Status: Full Family Communication: Pt in room, family at bedside Disposition Plan: Pending   Consultants:  Nephrology  Procedures:    Antibiotics:  IV vancomycin and Zosyn since 8/22-8/27  levaquin 8/27-8/28  PO vanc 8/29>>>  HPI/Subjective: States thinking more clearly. Eager to participate in rehab  Objective: Filed Vitals:   12/24/14 0555 12/24/14 0645 12/24/14 1016 12/24/14 1214  BP: 111/69  110/68 119/81  Pulse: 78  84   Temp:  98.1 F (36.7 C)    TempSrc: Oral Oral    Resp: 16     Height:      Weight:      SpO2: 100%       Intake/Output Summary (Last 24 hours) at 12/24/14 1526 Last data filed at 12/23/14 1555  Gross per 24 hour  Intake    240 ml  Output      0 ml  Net    240 ml   Filed Weights   12/19/14 1326 12/22/14 1352 12/22/14 2120  Weight: 81.2 kg (179 lb 0.2 oz) 82.1 kg (181 lb) 79.8 kg (175 lb 14.8 oz)    Exam:   General:  Awake, sitting in chair, in nad  Cardiovascular: regular, s1, s2  Respiratory: normal resp effort, no wheezing  Abdomen: soft, nondistended  Musculoskeletal: perfused, no cyanosis, no clubbing  Data Reviewed: Basic Metabolic Panel:  Recent Labs Lab 12/21/14 0629  12/22/14 0516 12/22/14 1744 12/22/14 1856 12/23/14 0545 12/24/14 0600   NA 135  < > 136 134* 139 138 137  K 4.1  < > 3.7 3.4* 3.3* 3.6 3.7  CL 106  < > 105 101 98* 107 106  CO2 17*  --  23 22  --  24 22  GLUCOSE 100*  < > 168* 216* 124* 236* 92  BUN 37*  < > 23* 26* 13 17 28*  CREATININE 4.93*  < > 3.94* 4.35* 2.40* 3.09* 4.54*  CALCIUM 8.3*  --  8.3* 8.6*  --  8.3* 8.6*  PHOS 3.9  --  3.4 3.2  --  3.2 3.6  < > = values in this interval not displayed. Liver Function Tests:  Recent Labs Lab 12/21/14 0629 12/22/14 0516 12/22/14 1744 12/23/14 0545 12/24/14 0600  ALBUMIN 3.6 3.9 3.6 3.8 3.6   No results for input(s): LIPASE, AMYLASE in the last 168 hours. No results for input(s): AMMONIA in the last 168 hours. CBC:  Recent Labs Lab 12/20/14 2325 12/21/14 0629 12/21/14 1437 12/22/14 0516 12/22/14 1745 12/22/14 1856 12/23/14 0545  WBC 20.8* 21.3*  --  15.5* 14.3*  --  14.1*  HGB 8.5* 8.3* 9.5* 8.2* 7.8* 9.5* 7.7*  HCT 25.8* 25.8* 28.0* 25.0* 23.9* 28.0* 23.9*  MCV 93.5 92.8  --  94.0 93.0  --  93.7  PLT 322 338  --  289 267  --  229   Cardiac Enzymes: No results for input(s): CKTOTAL, CKMB, CKMBINDEX, TROPONINI in the last 168 hours. BNP (last 3 results)  Recent Labs  11/05/14 2205  BNP 3626.3*    ProBNP (last 3 results)  Recent Labs  02/20/14 1515 02/24/14 1145 11/15/14 1622  PROBNP 15468.0* 10410.0* >4940.0*    CBG:  Recent Labs Lab 12/23/14 1148 12/23/14 1650 12/23/14 2127 12/24/14 0808 12/24/14 1215  GLUCAP 140* 240* 218* 80 177*    Recent Results (from the past 240 hour(s))  C difficile quick scan w PCR reflex     Status: None   Collection Time: 12/17/14  7:46 PM  Result Value Ref Range Status   C Diff antigen NEGATIVE NEGATIVE Final   C Diff toxin NEGATIVE NEGATIVE Final   C Diff interpretation Negative for toxigenic C. difficile  Final  Culture, blood (routine x 2)     Status: None   Collection Time: 12/18/14 10:37 AM  Result Value Ref Range Status   Specimen Description BLOOD RIGHT HAND  Final    Special Requests BOTTLES DRAWN AEROBIC ONLY 2CC  Final   Culture NO GROWTH 5 DAYS  Final   Report Status 12/23/2014 FINAL  Final  Culture, blood (routine x 2)     Status: None   Collection Time: 12/18/14 12:15 PM  Result Value Ref Range Status   Specimen Description BLOOD RIGHT HAND  Final   Special Requests BOTTLES DRAWN AEROBIC AND ANAEROBIC 5CC  Final   Culture NO GROWTH 5 DAYS  Final   Report Status 12/23/2014 FINAL  Final     Studies: No results found.  Scheduled Meds: . therapeutic plasma exchange solution   Dialysis Once in dialysis  . amiodarone  400 mg Oral Daily  . atorvastatin  40 mg Oral QHS  . calcitRIOL  1.75 mcg Oral Q M,W,F-HD  . colchicine  0.6 mg Oral Daily  . darbepoetin (ARANESP) injection - DIALYSIS  150 mcg Intravenous Q Mon-HD  . diltiazem  30 mg Oral 4 times per day  . feeding supplement (NEPRO CARB STEADY)  237 mL Oral BID BM  . fluocinonide ointment   Topical QID  . guaiFENesin  600 mg Oral BID  . heparin  1,000 Units Intracatheter Once  . heparin  1,000 Units Intracatheter Once  . insulin aspart  0-15 Units Subcutaneous TID WC  . insulin aspart  0-5 Units Subcutaneous QHS  . insulin glargine  8 Units Subcutaneous QHS  . metoprolol tartrate  75 mg Oral BID  . multivitamin  1 tablet Oral QHS  . nystatin   Topical BID  . phenytoin  300 mg Oral q morning - 10a  . saccharomyces boulardii  250 mg Oral BID  . sodium chloride  10-40 mL Intracatheter Q12H  . sodium chloride  3 mL Intravenous Q12H  . warfarin  4 mg Oral ONCE-1800  . Warfarin - Pharmacist Dosing Inpatient   Does not apply q1800   Continuous Infusions: . citrate dextrose      Principal Problem:   Sepsis Active Problems:   Atrial fibrillation-permanent   Essential hypertension   Gout   HCAP (healthcare-associated pneumonia)   ESRD on hemodialysis   Nausea vomiting and diarrhea   Pneumonia   Atrial fibrillation with RVR   Encephalopathy acute   Debility   Acute encephalopathy    Autoimmune encephalomyelitis    Pasqualina Colasurdo K  [facility] Pager [phone]. If 7PM-7AM, please contact night-coverage at [email or web address], password [facility] 12/24/2014, 3:26 PM  LOS: 15 days

## 2014-12-24 NOTE — Care Management Important Message (Signed)
Important Message  Patient Details  Name: Joseph [name] MRN: [id number] Date of Birth: 1955-09-19   Medicare Important Message Given:  Yes-fourth notification given    Carles Collet, RN 12/24/2014, 12:59 [place] Message  Patient Details  Name: Joseph [name] MRN: [id number] Date of Birth: [birth year]   Medicare Important Message Given:  Yes-fourth notification given    Carles Collet, RN 12/24/2014, 12:59 PM

## 2014-12-24 NOTE — Progress Notes (Addendum)
Occupational Therapy Treatment Patient Details Name: Joseph [name] MRN: [id number] DOB: [birth year] Today's Date: 12/24/2014    History of present illness 59 y.o. male readmission with fever cough HCAP, anemia, and MRI abnormal t2-flair involving temporal lobes/ hippocami bilaterally with question of trace diffusion abnormally within R hippocampus. MRI reveals remote L PCA infarct. Pt with recent d/c 11/30/14 PMH: CDIFF, limbic encephalitis, ESRD, DM, CHF, CKD, PNA, HTN, cardioversion, AV fistula L forearm, R heart catheterization   OT comments  Pt progressing. Continue to recommend CIR for rehab.  Follow Up Recommendations  CIR    Equipment Recommendations  Other (comment) (defer to next venue)    Recommendations for Other Services      Precautions / Restrictions Precautions Precautions: Fall Restrictions Weight Bearing Restrictions: No       Mobility Bed Mobility               General bed mobility comments: not assessed  Transfers Overall transfer level: Needs assistance Transfers: Sit to/from Stand Sit to Stand: Min guard         General transfer comment: cues for technique.Used RW for support.    Balance    Min guard for ambulation with RW. Able to stand and perform functional tasks with no LOB.                               ADL Overall ADL's : Needs assistance/impaired             Lower Body Bathing: Min guard;Sit to/from stand (washed bottom and peri area) Lower Body Bathing Details (indicate cue type and reason): also applied powder to peri area     Lower Body Dressing: Minimal assistance;Sit to/from stand Lower Body Dressing Details (indicate cue type and reason): assisted in threading underwear over LE. Toilet Transfer: Min guard;Ambulation;RW;BSC           Functional mobility during ADLs: Min guard;Rolling walker General ADL Comments: Pt able to don socks and doff underwear. Assist to thread leg into underwear and OT also  doffed sock towards end of session, but feel pt could have managed sock. Educated on LB dressing technique. Educated on energy conservation and deep breathing technique.       Vision                     Perception     Praxis      Cognition  Awake/Alert Behavior During Therapy: [facility] for tasks assessed/performed Overall Cognitive Status: Impaired/Different from baseline (wife reports off since July but better than he has been)     Memory: Decreased short-term memory           Extremity/Trunk Assessment                  Shoulder Instructions       General Comments      Pertinent Vitals/ Pain       Pain Assessment: No/denies pain; HR up to 125 in session (monitor reading AFIB)  Home Living                                          Prior Functioning/Environment              Frequency Min 3X/week     Progress Toward Goals  OT Goals(current goals can  now be found in the care plan section)  Progress towards OT goals: Progressing toward goals (updated some goals due to progress and time for goal achievement was tomorrow 9/6)  Acute Rehab OT Goals Patient Stated Goal: get back to working in his yard OT Goal Formulation: With patient/family Time For Goal Achievement: 01/01/15 Potential to Achieve Goals: Good ADL Goals Pt Will Perform Grooming: with set-up;standing;with supervision (3 tasks) Pt Will Perform Upper Body Bathing: with set-up;sitting Pt Will Perform Lower Body Bathing: sit to/from stand;with supervision;with set-up Pt Will Transfer to Toilet: bedside commode;ambulating;with supervision Pt Will Perform Toileting - Clothing Manipulation and hygiene: sit to/from stand;with supervision;with set-up Additional ADL Goal #1: Pt will complete adl for 5 minutes without rest break and HR below 120   Plan Discharge plan remains appropriate    Co-evaluation                 End of Session Equipment Utilized During  Treatment: Gait belt;Oxygen;Rolling walker   Activity Tolerance Patient tolerated treatment well   Patient Left in chair;with call bell/phone within reach;with chair alarm set;with family/visitor present   Nurse Communication          Time: 3748-2707 OT Time Calculation (min): 19 min  Charges: OT General Charges $OT Visit: 1 Procedure OT Treatments $Self Care/Home Management : 8-22 mins  Benito Mccreedy OTR/L [phone] 12/24/2014, 10:03 AM

## 2014-12-25 ENCOUNTER — Encounter (HOSPITAL_COMMUNITY): Payer: Self-pay | Admitting: *Deleted

## 2014-12-25 LAB — RENAL FUNCTION PANEL
ALBUMIN: 3.5 g/dL (ref 3.5–5.0)
Anion gap: 10 (ref 5–15)
BUN: 15 mg/dL (ref 6–20)
CALCIUM: 8.4 mg/dL — AB (ref 8.9–10.3)
CO2: 27 mmol/L (ref 22–32)
Chloride: 100 mmol/L — ABNORMAL LOW (ref 101–111)
Creatinine, Ser: 3.2 mg/dL — ABNORMAL HIGH (ref 0.61–1.24)
GFR calc Af Amer: 23 mL/min — ABNORMAL LOW (ref >60)
GFR calc non Af Amer: 20 mL/min — ABNORMAL LOW (ref >60)
GLUCOSE: 121 mg/dL — AB (ref 65–99)
PHOSPHORUS: 3.2 mg/dL (ref 2.5–4.6)
Potassium: 3.4 mmol/L — ABNORMAL LOW (ref 3.5–5.1)
SODIUM: 137 mmol/L (ref 135–145)

## 2014-12-25 LAB — PROTIME-INR
INR: 1.39 (ref 0.00–1.49)
PROTHROMBIN TIME: 17.1 s — AB (ref 11.6–15.2)

## 2014-12-25 LAB — CBC
HCT: 24.4 % — ABNORMAL LOW (ref 39.0–52.0)
Hemoglobin: 7.6 g/dL — ABNORMAL LOW (ref 13.0–17.0)
MCH: 30 pg (ref 26.0–34.0)
MCHC: 31.1 g/dL (ref 30.0–36.0)
MCV: 96.4 fL (ref 78.0–100.0)
PLATELETS: 206 10*3/uL (ref 150–400)
RBC: 2.53 MIL/uL — ABNORMAL LOW (ref 4.22–5.81)
RDW: 22.2 % — AB (ref 11.5–15.5)
WBC: 13.9 10*3/uL — ABNORMAL HIGH (ref 4.0–10.5)

## 2014-12-25 LAB — GLUCOSE, CAPILLARY
GLUCOSE-CAPILLARY: 109 mg/dL — AB (ref 65–99)
GLUCOSE-CAPILLARY: 238 mg/dL — AB (ref 65–99)
GLUCOSE-CAPILLARY: 259 mg/dL — AB (ref 65–99)
Glucose-Capillary: 163 mg/dL — ABNORMAL HIGH (ref 65–99)

## 2014-12-25 LAB — PHENYTOIN LEVEL, TOTAL: Phenytoin Lvl: 9.1 ug/mL — ABNORMAL LOW (ref 10.0–20.0)

## 2014-12-25 MED ORDER — INSULIN GLARGINE 100 UNIT/ML ~~LOC~~ SOLN: 8.0000 [IU] | Freq: Every day | SUBCUTANEOUS | Status: DC

## 2014-12-25 MED ORDER — AMIODARONE HCL 400 MG PO TABS: 400.0000 mg | ORAL_TABLET | Freq: Every day | ORAL | Status: DC

## 2014-12-25 MED ORDER — METOPROLOL TARTRATE 75 MG PO TABS: 75.0000 mg | ORAL_TABLET | Freq: Two times a day (BID) | ORAL | Status: DC

## 2014-12-25 MED ORDER — DILTIAZEM HCL 30 MG PO TABS: 30.0000 mg | ORAL_TABLET | Freq: Four times a day (QID) | ORAL | Status: DC

## 2014-12-25 MED ORDER — NAPHAZOLINE-PHENIRAMINE 0.025-0.3 % OP SOLN
2.0000 [drp] | Freq: Four times a day (QID) | OPHTHALMIC | Status: DC | PRN
  Administered 2014-12-25 – 2014-12-26 (×3): 2 [drp] via OPHTHALMIC
  Filled 2014-12-25 (×4): qty 5

## 2014-12-25 MED ORDER — WARFARIN SODIUM 6 MG PO TABS
6.0000 mg | ORAL_TABLET | Freq: Once | ORAL | Status: AC
  Administered 2014-12-25: 6 mg via ORAL
  Filled 2014-12-25: qty 1

## 2014-12-25 MED ORDER — NAPHAZOLINE HCL 0.1 % OP SOLN
2.0000 [drp] | Freq: Four times a day (QID) | OPHTHALMIC | Status: DC | PRN
  Filled 2014-12-25: qty 15

## 2014-12-25 NOTE — H&P (Signed)
  Physical Medicine and Rehabilitation Admission H&P    Chief Complaint  Patient presents with  . Code Sepsis  : HPI: Joseph [name] is a 59 y.o. 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 He was readmitted on 12/09/14 with fever, N/V/D, persistent cough with mild hemoptysis, tachycardia and hypotension due to sepsis from LLL PNA. He was bolused with I liter IV fluids and started on IV Vanc and Zosyn and chronic coumadin changed to IV heparin. Patient with lethargy and MRI brain repeated showing " persistent abnormal flare involving the mesial temporal lobes/hippocampi bilaterally, with question of trace diffusion abnormality within the right hippocampus felt to represent ongoing encephalitis".Patient did receive plasma exchange 5 days since completed her neurology services. Stools checked for C diff and showed evidence of colonization and remains on contact precautions. Acute on chronic anemia as well asBouts of hemoptysis since resolved and Coumadin had been held for a short time and since resumed. He was receiving IV amiodarone and cardizem for control of heart rate and fluid overload managed by HD and has since been transitioned to by mouth.  Mentation has improved suspect acute encephalopathy but patient remains significantly deconditioned. PT/OT evaluations done yesterday and CIR recommended by MD and Rehab team. Patient was admitted for a comprehensive rehabilitation program  ROS Review of Systems  Unable to perform ROS: mental acuity  HENT: Negative for hearing loss.  Eyes: Positive for blurred vision (without glasses).  Respiratory: Negative for cough, shortness of breath and wheezing.  Cardiovascular: Negative for chest pain and palpitations.  Gastrointestinal: Negative for  abdominal pain.  Musculoskeletal: Negative for myalgias.  Neurological: Negative for headaches Past Medical History  Diagnosis Date  . Atrial fibrillation     a. 11/18/2011 s/p DCCV - 150J;  b. Anticoagulation w/ Apixaban;  c. 11/2011 back in afib->asymptomatic.  . H/O alcohol abuse     a. drinks heavily on the weekends.  . Hypertension   . Diabetes mellitus   . Heart murmur     a. 09/2011 Echo: EF 55-60%, Triv AI, Mild MR, mildly dil LA.  . Diabetic peripheral neuropathy   . CKD (chronic kidney disease), stage III   . CHF (congestive heart failure)   . Pneumonia   . Sleep apnea    Past Surgical History  Procedure Laterality Date  . Eye sx  11/11/2011    left eye  . Cardioversion  11/18/2011    Procedure: CARDIOVERSION;  Surgeon: Peter C Nishan, MD;  Location: [facility]COPY;  Service: Cardiovascular;  Laterality: N/A;  . Colonoscopy w/ biopsies and polypectomy    . Av fistula placement Left 02/13/2014    Procedure: INSERTION OF ARTERIOVENOUS (AV) GORE-TEX GRAFT ARM;  Surgeon: Christopher S Dickson, MD;  Location: [facility] OR;  Service: Vascular;  Laterality: Left;  . Right heart catheterization N/A 10/11/2013    Procedure: RIGHT HEART CATH;  Surgeon: Dalton S McLean, MD;  Location: [facility] CATH LAB;  Service: Cardiovascular;  Laterality: N/A;   Family History  Problem Relation Age of Onset  . Heart disease Mother     before age 60  . Diabetes Father   . Diabetes Sister   . Peripheral vascular disease Sister     amputation  . Cancer Neg Hx   . Stroke Neg Hx    Social History:    reports that he quit smoking about 20 years ago. His smoking use included Cigarettes. He has a .05 pack-year smoking history. He has never used smokeless tobacco. He reports that he does not drink alcohol or use illicit drugs. Allergies:  Allergies  Allergen Reactions  . Keflex [Cephalexin] Other (See Comments)    c-diff reaction  . Dilaudid [Hydromorphone] Nausea And Vomiting   Medications Prior to Admission    Medication Sig Dispense Refill  . atorvastatin (LIPITOR) 40 MG tablet Take 40 mg by mouth at bedtime.     . calcitRIOL (ROCALTROL) 0.25 MCG capsule Take 7 capsules (1.75 mcg total) by mouth every Monday, Wednesday, and Friday with hemodialysis.    Marland Kitchen colchicine 0.6 MG tablet TAKE 1 TABLET (0.6 MG TOTAL) BY MOUTH 2 (TWO) TIMES DAILY. 60 tablet 2  . diltiazem (CARDIZEM CD) 120 MG 24 hr capsule Take 1 capsule (120 mg total) by mouth daily. 30 capsule 0  . ethyl chloride spray Apply 1 application topically every other day. Mon Wed Fri - done with dialysis  6  . fluocinonide ointment (LIDEX) 0.05 % Apply topically 4 (four) times daily. 30 g 0  . hydrocerin (EUCERIN) CREA Apply 1 application topically 4 (four) times daily.  0  . insulin aspart (NOVOLOG) 100 UNIT/ML injection Before each meal 3 times a day, 140-199 - 2 units, 200-250 - 4 units, 251-299 - 6 units,  300-349 - 8 units,  350 or above 10 units. 10 mL 11  . Insulin Glargine (LANTUS SOLOSTAR) 100 UNIT/ML Solostar Pen Inject 20 Units into the skin 2 (two) times daily. 15 mL 11  . levocetirizine (XYZAL) 5 MG tablet Take 5 mg by mouth every evening.    . metoprolol tartrate (LOPRESSOR) 50 MG tablet Take 1 tablet (50 mg total) by mouth 2 (two) times daily.    . Nutritional Supplements (FEEDING SUPPLEMENT, NEPRO CARB STEADY,) LIQD Take 237 mLs by mouth 2 (two) times daily between meals. 20 Can 0  . ondansetron (ZOFRAN) 4 MG tablet Take 4 mg by mouth 3 (three) times daily as needed for nausea or vomiting.    . pantoprazole (PROTONIX) 20 MG tablet Take 1 tablet (20 mg total) by mouth 2 (two) times daily.    . phenytoin (DILANTIN) 300 MG ER capsule Take 1 capsule (300 mg total) by mouth at bedtime. (Patient taking differently: Take 300 mg by mouth every morning. )    . saccharomyces boulardii (FLORASTOR) 250 MG capsule Take 1 capsule (250 mg total) by mouth 2 (two) times daily. (Patient taking differently: Take 250 mg by mouth daily. ) 30 capsule 0  .  sevelamer carbonate (RENVELA) 800 MG tablet Take 2 tablets (1,600 mg total) by mouth 3 (three) times daily with meals.    . warfarin (COUMADIN) 5 MG tablet Take 1 tablet (5 mg total) by mouth daily. 30 tablet 0  . enoxaparin (LOVENOX) 150 MG/ML injection Inject 0.55 mLs (85 mg total) into the skin daily. Stopped once INR reaches 2. 0 Syringe     Home: Home Living Family/patient expects to be discharged to:: Skilled nursing facility Living Arrangements: Spouse/significant other Available Help at Discharge: Family Type of Home: House Home Access: Level entry Home Layout: One level Bathroom Shower/Tub: Chiropodist: Standard Home Equipment: Kasandra Knudsen - single point, Environmental consultant - 2 wheels   Functional History: Prior Function Level of Independence: Needs assistance ADL's / Homemaking Assistance Needed: [facility] 11/30/14 d/c to [facility] with (A) for adls  Functional Status:  Mobility: Bed Mobility Overal bed mobility: Needs Assistance Bed Mobility: Sit to Supine Supine to sit: Min guard General bed mobility comments: Pt on BSC on arrival. Transfers Overall transfer level: Needs assistance Equipment used: Rolling walker (2 wheeled) Transfers: Sit to/from Stand Sit to Stand: Min assist Stand pivot transfers: Min assist General transfer comment: verbal cues for hand placement and safety Ambulation/Gait Ambulation/Gait assistance: Min assist Ambulation Distance (Feet): 25 Feet (x 2) Assistive device: Rolling walker (2 wheeled) Gait Pattern/deviations: Step-through pattern, Decreased stride length, Trunk flexed General Gait Details: verbal cues for safety and sequencing Gait velocity: decreased Gait velocity interpretation: Below normal speed for age/gender    ADL: ADL Overall ADL's : Needs assistance/impaired Grooming: Wash/dry face, Wash/dry hands, Applying deodorant, Min guard, Sitting Grooming Details (indicate cue type and reason): requires rest breaks and  cues for pursed lip breathing. HR reaching 118  Upper Body Bathing: Minimal assitance, Sitting Upper Body Bathing Details (indicate cue type and reason): requires rest breaks Lower Body Bathing: Min guard, Sit to/from stand (washed bottom and peri area) Lower Body Bathing Details (indicate cue type and reason): also applied powder to peri area Upper Body Dressing : Moderate assistance, Sitting Lower Body Dressing: Minimal assistance, Sit to/from stand Lower Body Dressing Details (indicate cue type and reason): assisted in threading underwear over LE. Toilet Transfer: Min guard, Ambulation, RW, BSC Functional mobility during ADLs: Min guard, Rolling walker General ADL Comments: Pt able to don socks and doff underwear. Assist to thread leg into underwear and OT also doffed sock towards end of session, but feel pt could have managed sock. Educated on LB dressing technique. Educated on energy conservation and deep breathing technique.   Cognition: Cognition Overall Cognitive Status: Impaired/Different from baseline Orientation Level: Oriented X4 Cognition Arousal/Alertness: Awake/alert Behavior During Therapy: WFL for tasks assessed/performed Overall Cognitive Status: Impaired/Different from baseline Area of Impairment: Orientation, Memory Orientation Level: Disoriented to, Time Current Attention Level: Selective Memory: Decreased short-term memory Following Commands: Follows one step commands consistently Safety/Judgement: Decreased awareness of safety Awareness: Intellectual Problem Solving: Slow processing, Decreased initiation General Comments: Patient unable to state year/month/day.  He states he is still working - has been on disability x2 years.  Physical Exam: Blood pressure 119/75, pulse 76, temperature 98.8 F (37.1 C), temperature source Oral, resp. rate 18, height 6' (1.829 m), weight 81.2 kg (179 lb 0.2 oz), SpO2 100 %. Physical Exam Nursing note and vitals  reviewed. Constitutional: He appears well-developed and well-nourished.   HENT: oral mucosa pink/moist Head: Normocephalic and atraumatic.  Eyes: Conjunctivae are normal. Pupils are equal, round, and reactive to light.  Neck: Normal range of motion. Neck supple.  Cardiovascular: An irregular rhythm present. Tachycardia present.  Respiratory: Effort normal. He has decreased breath sounds. He has no wheezes, no rales. He exhibits no tenderness. No dyspnea GI: Soft. Bowel sounds are normal. He exhibits no distension. There is no tenderness.  Musculoskeletal: He exhibits no edema or tenderness.  Neurological: He is alert.  Oriented to self, place, month. Able to follow simple one and two step motor commands. conversationally appropriate. Motor strength is 4 minus/5 in the right deltoid, biceps, triceps, grip Left grip is 4/5, proximally not tested secondary to ongoing hemodialysis Lower extremity strength 4 minus hip flexor knee extensor ankle dorsiflexor plantar flexor Sensation diminished bilateral feet to light touch/pain.   Results for orders placed or performed during the hospital encounter of 12/09/14 (from the past 48 hour(s))  Glucose, capillary     Status: Abnormal     Collection Time: 12/23/14  4:50 PM  Result Value Ref Range   Glucose-Capillary 240 (H) 65 - 99 mg/dL  Glucose, capillary     Status: Abnormal   Collection Time: 12/23/14  9:27 PM  Result Value Ref Range   Glucose-Capillary 218 (H) 65 - 99 mg/dL  Protime-INR     Status: Abnormal   Collection Time: 12/24/14  6:00 AM  Result Value Ref Range   Prothrombin Time 18.1 (H) 11.6 - 15.2 seconds   INR 1.49 0.00 - 1.49  Renal function panel     Status: Abnormal   Collection Time: 12/24/14  6:00 AM  Result Value Ref Range   Sodium 137 135 - 145 mmol/L   Potassium 3.7 3.5 - 5.1 mmol/L   Chloride 106 101 - 111 mmol/L   CO2 22 22 - 32 mmol/L   Glucose, Bld 92 65 - 99 mg/dL   BUN 28 (H) 6 - 20 mg/dL   Creatinine, Ser 4.54  (H) 0.61 - 1.24 mg/dL   Calcium 8.6 (L) 8.9 - 10.3 mg/dL   Phosphorus 3.6 2.5 - 4.6 mg/dL   Albumin 3.6 3.5 - 5.0 g/dL   GFR calc non Af Amer 13 (L) >60 mL/min   GFR calc Af Amer 15 (L) >60 mL/min    Comment: (NOTE) The eGFR has been calculated using the CKD EPI equation. This calculation has not been validated in all clinical situations. eGFR's persistently <60 mL/min signify possible Chronic Kidney Disease.    Anion gap 9 5 - 15  Glucose, capillary     Status: None   Collection Time: 12/24/14  8:08 AM  Result Value Ref Range   Glucose-Capillary 80 65 - 99 mg/dL  Glucose, capillary     Status: Abnormal   Collection Time: 12/24/14 12:15 PM  Result Value Ref Range   Glucose-Capillary 177 (H) 65 - 99 mg/dL  Renal function panel     Status: Abnormal   Collection Time: 12/24/14  3:30 PM  Result Value Ref Range   Sodium 136 135 - 145 mmol/L   Potassium 3.7 3.5 - 5.1 mmol/L   Chloride 105 101 - 111 mmol/L   CO2 22 22 - 32 mmol/L   Glucose, Bld 277 (H) 65 - 99 mg/dL   BUN 34 (H) 6 - 20 mg/dL   Creatinine, Ser 4.88 (H) 0.61 - 1.24 mg/dL   Calcium 8.3 (L) 8.9 - 10.3 mg/dL   Phosphorus 3.7 2.5 - 4.6 mg/dL   Albumin 3.2 (L) 3.5 - 5.0 g/dL   GFR calc non Af Amer 12 (L) >60 mL/min   GFR calc Af Amer 14 (L) >60 mL/min    Comment: (NOTE) The eGFR has been calculated using the CKD EPI equation. This calculation has not been validated in all clinical situations. eGFR's persistently <60 mL/min signify possible Chronic Kidney Disease.    Anion gap 9 5 - 15  CBC with Differential/Platelet     Status: Abnormal   Collection Time: 12/24/14  3:30 PM  Result Value Ref Range   WBC 15.9 (H) 4.0 - 10.5 K/uL   RBC 2.33 (L) 4.22 - 5.81 MIL/uL   Hemoglobin 7.0 (L) 13.0 - 17.0 g/dL   HCT 22.2 (L) 39.0 - 52.0 %   MCV 95.3 78.0 - 100.0 fL   MCH 30.0 26.0 - 34.0 pg   MCHC 31.5 30.0 - 36.0 g/dL   RDW 22.4 (H) 11.5 - 15.5 %   Platelets 202 150 - 400 K/uL   Neutrophils Relative %  74 43 - 77 %    Lymphocytes Relative 16 12 - 46 %   Monocytes Relative 8 3 - 12 %   Eosinophils Relative 1 0 - 5 %   Basophils Relative 1 0 - 1 %   Neutro Abs 11.7 (H) 1.7 - 7.7 K/uL   Lymphs Abs 2.5 0.7 - 4.0 K/uL   Monocytes Absolute 1.3 (H) 0.1 - 1.0 K/uL   Eosinophils Absolute 0.2 0.0 - 0.7 K/uL   Basophils Absolute 0.2 (H) 0.0 - 0.1 K/uL   RBC Morphology POLYCHROMASIA PRESENT    WBC Morphology SLIGHT TOXIC GRANULATION NOTED   Glucose, capillary     Status: None   Collection Time: 12/24/14  8:19 PM  Result Value Ref Range   Glucose-Capillary 98 65 - 99 mg/dL   Comment 1 Notify RN    Comment 2 Document in Chart   Glucose, capillary     Status: Abnormal   Collection Time: 12/24/14  9:20 PM  Result Value Ref Range   Glucose-Capillary 102 (H) 65 - 99 mg/dL   Comment 1 Notify RN    Comment 2 Document in Chart   Protime-INR     Status: Abnormal   Collection Time: 12/25/14  7:12 AM  Result Value Ref Range   Prothrombin Time 17.1 (H) 11.6 - 15.2 seconds   INR 1.39 0.00 - 1.49  Phenytoin level, total     Status: Abnormal   Collection Time: 12/25/14  7:12 AM  Result Value Ref Range   Phenytoin Lvl 9.1 (L) 10.0 - 20.0 ug/mL  Renal function panel     Status: Abnormal   Collection Time: 12/25/14  7:12 AM  Result Value Ref Range   Sodium 137 135 - 145 mmol/L   Potassium 3.4 (L) 3.5 - 5.1 mmol/L   Chloride 100 (L) 101 - 111 mmol/L   CO2 27 22 - 32 mmol/L   Glucose, Bld 121 (H) 65 - 99 mg/dL   BUN 15 6 - 20 mg/dL   Creatinine, Ser 3.20 (H) 0.61 - 1.24 mg/dL    Comment: REPEATED TO VERIFY   Calcium 8.4 (L) 8.9 - 10.3 mg/dL   Phosphorus 3.2 2.5 - 4.6 mg/dL   Albumin 3.5 3.5 - 5.0 g/dL   GFR calc non Af Amer 20 (L) >60 mL/min   GFR calc Af Amer 23 (L) >60 mL/min    Comment: (NOTE) The eGFR has been calculated using the CKD EPI equation. This calculation has not been validated in all clinical situations. eGFR's persistently <60 mL/min signify possible Chronic Kidney Disease.    Anion gap 10 5 -  15  CBC     Status: Abnormal   Collection Time: 12/25/14  7:24 AM  Result Value Ref Range   WBC 13.9 (H) 4.0 - 10.5 K/uL   RBC 2.53 (L) 4.22 - 5.81 MIL/uL   Hemoglobin 7.6 (L) 13.0 - 17.0 g/dL   HCT 24.4 (L) 39.0 - 52.0 %   MCV 96.4 78.0 - 100.0 fL   MCH 30.0 26.0 - 34.0 pg   MCHC 31.1 30.0 - 36.0 g/dL   RDW 22.2 (H) 11.5 - 15.5 %   Platelets 206 150 - 400 K/uL  Glucose, capillary     Status: Abnormal   Collection Time: 12/25/14  8:51 AM  Result Value Ref Range   Glucose-Capillary 163 (H) 65 - 99 mg/dL  Glucose, capillary     Status: Abnormal   Collection Time: 12/25/14 12:12 PM  Result Value Ref Range     Glucose-Capillary 109 (H) 65 - 99 mg/dL   No results found.     Medical Problem List and Plan: 1. Functional deficits secondary to debilitation related to pneumonia/sepsis/ multi-medical with limbic encephalitis/seizure disorder 2.  DVT Prophylaxis/Anticoagulation: Chronic Coumadin therapy. Monitor for any bleeding episodes 3. Pain Management: Tylenol as needed 4. End-stage renal disease. Continue hemodialysis as indicated 5. Neuropsych: This patient is capable of making decisions on his own behalf. 6. Skin/Wound Care: Routine skin checks 7. Fluids/Electrolytes/Nutrition: Routine I&O with follow-up chemistries 8. Hypertension/atrial fibrillation. Amiodarone 400 mg daily, Cardizem 30 mg 4 times a day, Lopressor 75 mg twice a day. Monitor with increased mobility 9. Seizure disorder. Dilantin 300 mg daily. Monitor for seizure activity 10. Acute on chronic anemia. Follow-up CBC   Post Admission Physician Evaluation: 1. Functional deficits secondary  to debilitation/encephalopathy. 2. Patient is admitted to receive collaborative, interdisciplinary care between the physiatrist, rehab nursing staff, and therapy team. 3. Patient's level of medical complexity and substantial therapy needs in context of that medical necessity cannot be provided at a lesser intensity of care such as a  SNF. 4. Patient has experienced substantial functional loss from his/her baseline which was documented above under the "Functional History" and "Functional Status" headings.  Judging by the patient's diagnosis, physical exam, and functional history, the patient has potential for functional progress which will result in measurable gains while on inpatient rehab.  These gains will be of substantial and practical use upon discharge  in facilitating mobility and self-care at the household level. 5. Physiatrist will provide 24 hour management of medical needs as well as oversight of the therapy plan/treatment and provide guidance as appropriate regarding the interaction of the two. 6. 24 hour rehab nursing will assist with bladder management, bowel management, safety, skin/wound care, disease management, medication administration, pain management and patient education  and help integrate therapy concepts, techniques,education, etc. 7. PT will assess and treat for/with: Lower extremity strength, range of motion, stamina, balance, functional mobility, safety, adaptive techniques and equipment, NMR, cognitive perceptual awareness, activity tolerance.   Goals are: mod I. 8. OT will assess and treat for/with: ADL's, functional mobility, safety, upper extremity strength, adaptive techniques and equipment, NMR, cognitive perceptual rx, activity tolerance.   Goals are: mod I. Therapy may proceed with showering this patient. 9. SLP will assess and treat for/with: n/a.  Goals are: n/a. 10. Case Management and Social Worker will assess and treat for psychological issues and discharge planning. 11. Team conference will be held weekly to assess progress toward goals and to determine barriers to discharge. 12. Patient will receive at least 3 hours of therapy per day at least 5 days per week. 13. ELOS: 7-10 days       14. Prognosis:  excellent     Zachary T. Swartz, MD, FAAPMR [facility] 12/26/2014   12/25/2014 

## 2014-12-25 NOTE — Progress Notes (Signed)
I am pursuing [facility] approval for possible admission to inpt rehab. I discussed with RNCM. Question if it will be approved. [phone]

## 2014-12-25 NOTE — Progress Notes (Signed)
Subjective:   Tired from therapy this morning but feels well. Wife concerned about LE edema.  Objective Filed Vitals:   12/24/14 1927 12/24/14 2123 12/24/14 2200 12/25/14 0504  BP: 119/74 92/51 123/62 119/75  Pulse: 79 83 101 76  Temp: 98.5 F (36.9 C) 99.6 F (37.6 C)  98.8 F (37.1 C)  TempSrc: Oral Oral  Oral  Resp: 18 18  18   Height:      Weight: 80.7 kg (177 lb 14.6 oz)   81.2 kg (179 lb 0.2 oz)  SpO2: 100% 97%  100%   Physical Exam General: sitting in chair. Alert. No acute distress.  Heart: RRR  Lungs: CTA, unlabored Abdomen: soft, nontender +BS  Extremities: +LE edema Dialysis Access:  L AVG +b/t   R IJ Cath placed for plasma exchanges  Dialysis Orders: [facility] 4 hrs 80kgs 3K/2.5Ca No Heparin   L AVG  (R IJ cath for plasma exchange) Profile 2 Micera 100 q 2 weeks- to start 8/24 Calcitriol 1.75  Assessment: 1. Sepsis- off antibiotics. Needs repeat chest xray in 4 weeks. On po vanc for cdiff 2. Acute encephalopathy/ "limbic" encephalitis- improving. S/p plasma exchanges- last 9/3 2. ESRD - [facility]. HD pending today- run longer to attempt more volume 3. Anemia - aranesp q Monday- hgb 7.7- increase dose.  4. Secondary hyperparathyroidism - ca+ 8.6/phos 3.6. Cont calcitriol.  5. HTN/volume excess - LE edema, continue to lower dry wt 6. Nutrition - appetite improving. Alb 3.6.renal diet.  7 afib- on coumadin and amiodarone 8. Seizure- dilantin 9. DM  Plan - HD tomorrow, cont to lower dry wt  Kelly Splinter MD (pgr) [phone]    [phone] 12/25/2014, 12:20 PM    Additional Objective Labs: Basic Metabolic Panel:  Recent Labs Lab 12/24/14 0600 12/24/14 1530 12/25/14 0712  NA 137 136 137  K 3.7 3.7 3.4*  CL 106 105 100*  CO2 22 22 27   GLUCOSE 92 277* 121*  BUN 28* 34* 15  CREATININE 4.54* 4.88* 3.20*  CALCIUM 8.6* 8.3* 8.4*  PHOS 3.6 3.7 3.2   Liver Function Tests:  Recent Labs Lab 12/24/14 0600 12/24/14 1530 12/25/14 0712  ALBUMIN  3.6 3.2* 3.5   No results for input(s): LIPASE, AMYLASE in the last 168 hours. CBC:  Recent Labs Lab 12/22/14 0516 12/22/14 1745  12/23/14 0545 12/24/14 1530 12/25/14 0724  WBC 15.5* 14.3*  --  14.1* 15.9* 13.9*  NEUTROABS  --   --   --   --  11.7*  --   HGB 8.2* 7.8*  < > 7.7* 7.0* 7.6*  HCT 25.0* 23.9*  < > 23.9* 22.2* 24.4*  MCV 94.0 93.0  --  93.7 95.3 96.4  PLT 289 267  --  229 202 206  < > = values in this interval not displayed. Blood Culture    Component Value Date/Time   SDES BLOOD RIGHT HAND 12/18/2014 1215   SPECREQUEST BOTTLES DRAWN AEROBIC AND ANAEROBIC 5CC 12/18/2014 1215   CULT NO GROWTH 5 DAYS 12/18/2014 1215   REPTSTATUS 12/23/2014 FINAL 12/18/2014 1215    Cardiac Enzymes: No results for input(s): CKTOTAL, CKMB, CKMBINDEX, TROPONINI in the last 168 hours. CBG:  Recent Labs Lab 12/24/14 0808 12/24/14 1215 12/24/14 2019 12/24/14 2120 12/25/14 0851  GLUCAP 80 177* 98 102* 163*   Iron Studies: No results for input(s): IRON, TIBC, TRANSFERRIN, FERRITIN in the last 72 hours. @lablastinr3 @ Studies/Results: No results found. Medications: . citrate dextrose     . therapeutic plasma  exchange solution   Dialysis Once in dialysis  . amiodarone  400 mg Oral Daily  . atorvastatin  40 mg Oral QHS  . calcitRIOL  1.75 mcg Oral Q M,W,F-HD  . colchicine  0.6 mg Oral Daily  . darbepoetin (ARANESP) injection - DIALYSIS  150 mcg Intravenous Q Mon-HD  . diltiazem  30 mg Oral 4 times per day  . feeding supplement (NEPRO CARB STEADY)  237 mL Oral BID BM  . fluocinonide ointment   Topical QID  . guaiFENesin  600 mg Oral BID  . heparin  1,000 Units Intracatheter Once  . heparin  1,000 Units Intracatheter Once  . insulin aspart  0-15 Units Subcutaneous TID WC  . insulin aspart  0-5 Units Subcutaneous QHS  . insulin glargine  8 Units Subcutaneous QHS  . metoprolol tartrate  75 mg Oral BID  . multivitamin  1 tablet Oral QHS  . nystatin   Topical BID  . phenytoin   300 mg Oral q morning - 10a  . saccharomyces boulardii  250 mg Oral BID  . sodium chloride  10-40 mL Intracatheter Q12H  . sodium chloride  3 mL Intravenous Q12H  . warfarin  6 mg Oral ONCE-1800  . Warfarin - Pharmacist Dosing Inpatient   Does not apply [phone]

## 2014-12-25 NOTE — Progress Notes (Signed)
Nutrition Follow-up  DOCUMENTATION CODES:   Not applicable  INTERVENTION:   -Continue Nepro Shake po BID, each supplement provides 425 kcal and 19 grams protein -Continue Renal MVI  NUTRITION DIAGNOSIS:   Increased nutrient needs related to chronic illness as evidenced by estimated needs.  Ongoing  GOAL:   Patient will meet greater than or equal to 90% of their needs  Progressing  MONITOR:   PO intake, Supplement acceptance, I & O's, Labs, Weight trends, Skin  REASON FOR ASSESSMENT:   Malnutrition Screening Tool    ASSESSMENT:   59 year old male with history of end-stage renal disease on dialysis (M, W, F), chronic A. fib on Coumadin, gout, type 2 diabetes mellitus with recent hospitalization for encephalopathy with MRI showing limbic encephalitis. During that time he had undergone LP which was unremarkable. He did respond to IV steroid initially which was discontinued as patient had C. difficile colitis. Patient brought to the ED as he was having persistent fever for one day associated with productive cough with some hemoptysis. He also reported having nausea and vomiting with diarrhea. On presentation patient was mildly hypotensive and received 1 L IV normal saline bolus in the ED following which his heart rate and blood pressure improved. Patient appeared to have some confusion and lethargy on presentation.  Pt sitting in recliner, about to be assisted by nurse tech at time of visit. Staff report mentation has improved.   Appetite remains good; PO: 50-100% of meals. Pt continues to accept Nepro shakes.   Per RN documentation, pt with weeping edema.   Per MD notes, pt completed 5/5 PLEX treatments on 12/23/14.  CSW following. Plan is for SNF vs CIR, potentially tomorrow, pending insurance authorization.   Diet Order:  Diet renal with fluid restriction Fluid restriction:: 1200 mL Fluid; Room service appropriate?: Yes; Fluid consistency:: Thin  Skin:  Reviewed, no  issues  Last BM:  12/24/14  Height:   Ht Readings from Last 1 Encounters:  12/11/14 6' (1.829 m)    Weight:   Wt Readings from Last 1 Encounters:  12/25/14 179 lb 0.2 oz (81.2 kg)    Ideal Body Weight:  80.9 kg  BMI:  Body mass index is 24.27 kg/(m^2).  Estimated Nutritional Needs:   Kcal:  2200-2400  Protein:  105-120 grams  Fluid:  per MD  EDUCATION NEEDS:   No education needs identified at this time  [place] A. Jimmye Norman, RD, LDN, CDE Pager: [phone] After hours Pager: [phone]

## 2014-12-25 NOTE — Discharge Summary (Signed)
Physician Discharge Summary  Joseph [name] ZOX:[id number] DOB: [birth year] DOA: 12/09/2014  PCP: Webb Silversmith, NP  Admit date: 12/09/2014 Discharge date: 12/26/2014  Time spent: 20 minutes  D/C to CIR  Recommendations for Outpatient Follow-up:  1. Follow up with PCP 2. Please repeat CXR in 4 weeks to ensure resolution of PNA  Discharge Diagnoses:  Principal Problem:   Sepsis Active Problems:   Atrial fibrillation-permanent   Essential hypertension   Gout   HCAP (healthcare-associated pneumonia)   ESRD on hemodialysis   Nausea vomiting and diarrhea   Pneumonia   Atrial fibrillation with RVR   Encephalopathy acute   Debility   Acute encephalopathy   Autoimmune encephalomyelitis   Discharge Condition: Improved  Diet recommendation: Renal with 1200cc fluid restriction  Filed Weights   12/24/14 1501 12/24/14 1927 12/25/14 0504  Weight: 81.3 kg (179 lb 3.7 oz) 80.7 kg (177 lb 14.6 oz) 81.2 kg (179 lb 0.2 oz)    History of present illness:  59 year old male with history of end-stage renal disease on dialysis (M, W, F), chronic A. fib on Coumadin, gout, type 2 diabetes mellitus with recent hospitalization for encephalopathy with MRI showing limbic encephalitis. During that time he had undergone LP which was unremarkable. He did respond to IV steroid initially which was discontinued as patient had C. difficile colitis. Patient brought to the ED as he was having persistent fever for one day associated with productive cough with some hemoptysis. He also reported having nausea and vomiting with diarrhea. On presentation patient was mildly hypotensive and received 1 L IV normal saline bolus in the ED following which his heart rate and blood pressure improved. Patient appeared to have some confusion and lethargy on presentation. Patient met criteria for sepsis and imaging showed left-sided pneumonia. Patient placed on empiric antibiotics and admitted to stepdown.  Hospital Course:  Sepsis  secondary to healthcare associated pneumonia -Patient completed 7 day course of antibiotics on 8/28 . -Blood cultures negative. Hemoptysis on presentation resolved. H&H stable. Pulmonary has since signed off..  -Follow up chest x-ray without new infiltrate. -Recommendation for follow-up chest x-ray in 4 weeks to see for resolution. -Remained on min O2 support  Persistent leukocytosis -Patient noted to have off-and-on diarrhea. -Was treated with 2 weeks course of high-dose vancomycin during recent hospitalization. Repeat C. difficile negative. Dr. Clementeen Graham had spoken with Dr. Megan Salon on 8/29 who recommended to treat with oral vancomycin for one week - completed. -Monitor WBC. Ordered blood culture, neg thus far. GI pathogen panel is pan-negative -Remained stable thus far without fevers -WBC consistently trending down  Acute encephalopathy -Associated with underlying sepsis versus ongoing encephalitis. Patient was recently hospitalized for limbic encephalitis. MRI on admission showing persistent abnormal T2/FLAIR signal intensity involving mesial temporal lobes/hippocampi bilaterally which seems to have improved overall compared to MRI from 11/21/2014. -Patient initially remained confused off and on and occasionally hallucinating. EEG was repeated and is unremarkable for acute seizures. -Received IV Solu-Medrol for 5 days during recent hospitalization.  -LP was negative.  -Ultrasound of the scrotum to rule out testicular malignancy was negative. (As per neurology to rule out paraneoplastic encephalitis). -Neurology suggested this could be post infectious versus autoimmune encephalitis. -Recommendation were made for starting plasma exchange daily . HD catheter was placed on 8/29 and patient underwent plasmapheresis  -Clinically pt has shown improvement.   A. fib with RVR -Noted after returning from dialysis on 8/22.  -CHADS 2 -Required Cardizem and amiodarone drip. Now on by mouth  amiodarone .Remains  off Cardizem drip On metoprolol twice a day. Currently rate controlled. -Coumadin resumed. -Echo from 08/2014 showed LVH with EF of 50-55%  ESRD on hemodialysis -Scheduled HD to be continued on MWF -Nephrology had been following  History of seizure -Continue Dilantin.  Diabetes mellitus -Glucose improved after low-dose Lantus added.  Protein calorie malnutrition -Continue supplements   Consultations:  Nephrology  Neurology  Discharge Exam: Filed Vitals:   12/24/14 2123 12/24/14 2200 12/25/14 0504 12/25/14 1442  BP: 92/51 123/62 119/75 111/78  Pulse: 83 101 76 100  Temp: 99.6 F (37.6 C)  98.8 F (37.1 C) 98.7 F (37.1 C)  TempSrc: Oral  Oral Oral  Resp: '18  18 16  ' Height:      Weight:   81.2 kg (179 lb 0.2 oz)   SpO2: 97%  100% 78%    General: awake, in nad Cardiovascular: regular, s1, s2 Respiratory: normal resp effort, no wheezing  Discharge Instructions     Medication List    STOP taking these medications        diltiazem 120 MG 24 hr capsule  Commonly known as:  CARDIZEM CD     Insulin Glargine 100 UNIT/ML Solostar Pen  Commonly known as:  LANTUS SOLOSTAR  Replaced by:  insulin glargine 100 UNIT/ML injection     levocetirizine 5 MG tablet  Commonly known as:  XYZAL     pantoprazole 20 MG tablet  Commonly known as:  PROTONIX      TAKE these medications        amiodarone 400 MG tablet  Commonly known as:  PACERONE  Take 1 tablet (400 mg total) by mouth daily.     atorvastatin 40 MG tablet  Commonly known as:  LIPITOR  Take 40 mg by mouth at bedtime.     calcitRIOL 0.25 MCG capsule  Commonly known as:  ROCALTROL  Take 7 capsules (1.75 mcg total) by mouth every Monday, Wednesday, and Friday with hemodialysis.     colchicine 0.6 MG tablet  TAKE 1 TABLET (0.6 MG TOTAL) BY MOUTH 2 (TWO) TIMES DAILY.     diltiazem 30 MG tablet  Commonly known as:  CARDIZEM  Take 1 tablet (30 mg total) by mouth every 6 (six) hours.      enoxaparin 150 MG/ML injection  Commonly known as:  LOVENOX  Inject 0.55 mLs (85 mg total) into the skin daily. Stopped once INR reaches 2.     ethyl chloride spray  Apply 1 application topically every other day. Mon Wed Fri - done with dialysis     feeding supplement (NEPRO CARB STEADY) Liqd  Take 237 mLs by mouth 2 (two) times daily between meals.     fluocinonide ointment 0.05 %  Commonly known as:  LIDEX  Apply topically 4 (four) times daily.     hydrocerin Crea  Apply 1 application topically 4 (four) times daily.     insulin aspart 100 UNIT/ML injection  Commonly known as:  novoLOG  Before each meal 3 times a day, 140-199 - 2 units, 200-250 - 4 units, 251-299 - 6 units,  300-349 - 8 units,  350 or above 10 units.     insulin glargine 100 UNIT/ML injection  Commonly known as:  LANTUS  Inject 0.08 mLs (8 Units total) into the skin at bedtime.     Metoprolol Tartrate 75 MG Tabs  Take 75 mg by mouth 2 (two) times daily.     ondansetron 4 MG tablet  Commonly known as:  ZOFRAN  Take 4 mg by mouth 3 (three) times daily as needed for nausea or vomiting.     phenytoin 300 MG ER capsule  Commonly known as:  DILANTIN  Take 1 capsule (300 mg total) by mouth at bedtime.     saccharomyces boulardii 250 MG capsule  Commonly known as:  FLORASTOR  Take 1 capsule (250 mg total) by mouth 2 (two) times daily.     sevelamer carbonate 800 MG tablet  Commonly known as:  RENVELA  Take 2 tablets (1,600 mg total) by mouth 3 (three) times daily with meals.     warfarin 5 MG tablet  Commonly known as:  COUMADIN  Take 1 tablet (5 mg total) by mouth daily.       Allergies  Allergen Reactions  . Keflex [Cephalexin] Other (See Comments)    c-diff reaction  . Dilaudid [Hydromorphone] Nausea And Vomiting   Follow-up Information    Follow up with Webb Silversmith, NP. Schedule an appointment as soon as possible for a visit in 1 week.   Specialty:  Internal Medicine   Why:  Hospital  follow up   Contact information:   [place] [address] [phone]        The results of significant diagnostics from this hospitalization (including imaging, microbiology, ancillary and laboratory) are listed below for reference.    Significant Diagnostic Studies: Ct Abdomen Pelvis Wo Contrast  12/10/2014   CLINICAL DATA:  Hematemesis. Fever. Altered mental status. Recent hospitalization for acute encephalopathy, hospital acquired pneumonia and positive c. diff PCR.  EXAM: CT CHEST, ABDOMEN AND PELVIS WITHOUT CONTRAST  TECHNIQUE: Multidetector CT imaging of the chest, abdomen and pelvis was performed following the standard protocol without IV contrast.  COMPARISON:  CT 11/27/2014  FINDINGS: CT CHEST FINDINGS  New from prior exam confluent and ground-glass airspace opacity in the left lower lobe, with air bronchograms. More diffuse ground-glass opacity throughout all lobes of both lungs, also new from prior. The tree in bud opacities in the right upper lobe are similar. Small right pleural effusion, unchanged from prior exam. There is a diminutive left pleural effusion that is new. No pericardial effusion.  The heart size is normal. Coronary artery calcifications are seen. Progressive mediastinal adenopathy, for example left lower paratracheal lymph node measures 1.7 cm in short axis dimension. There prominent mildly enlarged upper paratracheal and prevascular lymph nodes. Limited assessment for hilar adenopathy given lack of contrast. Thoracic aorta is normal in caliber with mild atherosclerosis.  CT ABDOMEN AND PELVIS FINDINGS  Small volume of perihepatic ascites, diminished in volume from prior exam. The liver appears prominent in size measuring 19.2 cm craniocaudal dimension. No focal hepatic lesion allowing for lack contrast. The gallbladder is decompressed with gallbladder wall thickening. The spleen is normal in size. The adrenal glands are normal. Mild bilateral renal  parenchymal atrophy, no hydronephrosis.  Questionable minimal haziness about the head of the pancreas. No peripancreatic fluid collection.  The stomach is decompressed. No small bowel thickening. Fluid within the cecum and proximal ascending colon without colonic wall thickening. Remainder the colon is decompressed.  No retroperitoneal adenopathy. Abdominal aorta is normal in caliber. Moderate atherosclerosis without aneurysm.  Within the pelvis there is a small volume of ascites. Bladder is minimally distended with equivocal bladder wall thickening. Prostate gland is normal in size. Atherosclerosis noted of the inguinal vasculature. Small fat containing umbilical hernia. Soft tissue densities in the anterior abdominal wall, may be related to injections.  Review of the osseous structures of the chest abdomen and pelvis demonstrates no acute osseous abnormality. No suspicious osseous lesion.  IMPRESSION: 1. Development of confluent airspace disease with air bronchograms in the left lower lobe. Additionally development of mild diffuse ground-glass opacity throughout all lobes of both lungs. This diffuse appearance may be infectious or inflammatory in etiology or represent pulmonary edema. Left lower lobe findings favor aspiration or pneumonia, which may be a separate process than the more diffuse ground-glass opacities. 2. Small right pleural effusion, not significantly changed. Development of diminutive left pleural effusion that may be reactive. New mediastinal adenopathy which is likely reactive. 3. Persistent but decreased perihepatic ascites. Liver appears prominent in size. Decompressed gallbladder with gallbladder wall thickening. Constellation of findings suggests chronic liver disease. 4. Questionable edema about the head of the pancreas. This may be related to third spacing/ascites versus pancreatic inflammation. 5. Minimal fluid in the cecum and ascending colon without associated colonic wall thickening.    Electronically Signed   By: Jeb Levering M.D.   On: 12/10/2014 01:58   Ct Abdomen Pelvis Wo Contrast  11/27/2014   CLINICAL DATA:  59 year old male with C difficile, elevated white count, altered mental status/encephalitis and diabetic ketoacidosis. Elevated LFTs and end-stage renal disease.  EXAM: CT CHEST, ABDOMEN AND PELVIS WITHOUT CONTRAST  TECHNIQUE: Multidetector CT imaging of the chest, abdomen and pelvis was performed following the standard protocol without IV contrast.  COMPARISON:  11/18/2014 and prior chest radiographs. 11/06/2014 abdominal ultrasound.  FINDINGS: CT CHEST FINDINGS  Mediastinum/Nodes: Mild pulmonary and moderate-heavy coronary artery calcifications noted. There is no evidence of thoracic aortic aneurysm or pericardial effusion. Shotty mediastinal lymph nodes are identified.  Lungs/Pleura: A small right pleural effusion is noted. Mild basilar atelectasis identified. Mild interstitial opacities within bilateral upper lobes noted, some with a tree-in-bud type appearance-question infection. There is no definite airspace disease, consolidation, suspicious nodule, mass or endobronchial/ endotracheal lesion.  Musculoskeletal: No acute or suspicious abnormalities.  CT ABDOMEN AND PELVIS FINDINGS  Please note that parenchymal abnormalities may be missed without intravenous contrast.  Hepatobiliary: The liver and gallbladder are unremarkable. There is no evidence of biliary dilatation.  Pancreas: Unremarkable  Spleen: Unremarkable  Adrenals/Urinary Tract: Mild bilateral renal atrophy noted. The adrenal glands and bladder are unremarkable.  Stomach/Bowel: There is no evidence of bowel obstruction or are definite bowel wall thickening. The appendix is normal.  Vascular/Lymphatic: No enlarged lymph nodes or abdominal aortic aneurysm. Mild aortic atherosclerotic calcifications noted.  Reproductive: Prostate unremarkable.  Other: A small amount of ascites within the abdomen and pelvis noted. There  is no evidence of pneumoperitoneum. A small umbilical hernia containing fat is identified. Mild subcutaneous edema is present.  Musculoskeletal: No acute or suspicious abnormalities.  IMPRESSION: Mild bilateral upper lobe interstitial opacities some with a tree-in-bud type appearance which may represent infection.  Small right pleural effusion, small amount of ascites and mild subcutaneous edema.  No evidence of bowel wall thickening.   Electronically Signed   By: Margarette Canada M.D.   On: 11/27/2014 15:22   Dg Chest 2 View  12/09/2014   CLINICAL DATA:  Altered mental status. Weakness. Shortness of breath. Rash on the back of the trauma for 1 day. Fever and cough. History of hypertension and diabetes. Nonsmoker.  EXAM: CHEST  2 VIEW  COMPARISON:  12/01/2014  FINDINGS: Focal airspace infiltration in the left lower lung posteriorly with possible blunting of the left costophrenic angle. Changes may represent focal pneumonia. Borderline heart size without  significant vascular congestion. No pneumothorax. Mediastinal contours appear intact. Degenerative changes in the shoulders and spine.  IMPRESSION: Focal infiltration in the left lung base with blunting of left costophrenic angle likely due to pneumonia.   Electronically Signed   By: Lucienne Capers M.D.   On: 12/09/2014 22:08   Ct Head Wo Contrast  12/10/2014   CLINICAL DATA:  Hematemesis. Altered mental status. Acute encephalopathy. Recent hospitalization for acute encephalopathy, complicated by hospital acquired pneumonia and positive C difficile.  EXAM: CT HEAD WITHOUT CONTRAST  TECHNIQUE: Contiguous axial images were obtained from the base of the skull through the vertex without intravenous contrast.  COMPARISON:  MRI brain 11/26/2014.  CT head 11/18/2014.  FINDINGS: Mild diffuse cerebral atrophy. No ventricular dilatation. Old area of encephalomalacia in the left posterior occipital lobe. No mass effect or midline shift. No abnormal extra-axial fluid  collections. Gray-white matter junctions are distinct. Basal cisterns are not effaced. No evidence of acute intracranial hemorrhage. No depressed skull fractures. Visualized paranasal sinuses and mastoid air cells are not opacified. Vascular calcifications.  IMPRESSION: No acute intracranial abnormalities. Focal encephalomalacia in the left posterior occipital lobe is unchanged since prior study.   Electronically Signed   By: Lucienne Capers M.D.   On: 12/10/2014 01:42   Ct Chest Wo Contrast  12/10/2014   CLINICAL DATA:  Hematemesis. Fever. Altered mental status. Recent hospitalization for acute encephalopathy, hospital acquired pneumonia and positive c. diff PCR.  EXAM: CT CHEST, ABDOMEN AND PELVIS WITHOUT CONTRAST  TECHNIQUE: Multidetector CT imaging of the chest, abdomen and pelvis was performed following the standard protocol without IV contrast.  COMPARISON:  CT 11/27/2014  FINDINGS: CT CHEST FINDINGS  New from prior exam confluent and ground-glass airspace opacity in the left lower lobe, with air bronchograms. More diffuse ground-glass opacity throughout all lobes of both lungs, also new from prior. The tree in bud opacities in the right upper lobe are similar. Small right pleural effusion, unchanged from prior exam. There is a diminutive left pleural effusion that is new. No pericardial effusion.  The heart size is normal. Coronary artery calcifications are seen. Progressive mediastinal adenopathy, for example left lower paratracheal lymph node measures 1.7 cm in short axis dimension. There prominent mildly enlarged upper paratracheal and prevascular lymph nodes. Limited assessment for hilar adenopathy given lack of contrast. Thoracic aorta is normal in caliber with mild atherosclerosis.  CT ABDOMEN AND PELVIS FINDINGS  Small volume of perihepatic ascites, diminished in volume from prior exam. The liver appears prominent in size measuring 19.2 cm craniocaudal dimension. No focal hepatic lesion allowing  for lack contrast. The gallbladder is decompressed with gallbladder wall thickening. The spleen is normal in size. The adrenal glands are normal. Mild bilateral renal parenchymal atrophy, no hydronephrosis.  Questionable minimal haziness about the head of the pancreas. No peripancreatic fluid collection.  The stomach is decompressed. No small bowel thickening. Fluid within the cecum and proximal ascending colon without colonic wall thickening. Remainder the colon is decompressed.  No retroperitoneal adenopathy. Abdominal aorta is normal in caliber. Moderate atherosclerosis without aneurysm.  Within the pelvis there is a small volume of ascites. Bladder is minimally distended with equivocal bladder wall thickening. Prostate gland is normal in size. Atherosclerosis noted of the inguinal vasculature. Small fat containing umbilical hernia. Soft tissue densities in the anterior abdominal wall, may be related to injections.  Review of the osseous structures of the chest abdomen and pelvis demonstrates no acute osseous abnormality. No suspicious osseous lesion.  IMPRESSION: 1.  Development of confluent airspace disease with air bronchograms in the left lower lobe. Additionally development of mild diffuse ground-glass opacity throughout all lobes of both lungs. This diffuse appearance may be infectious or inflammatory in etiology or represent pulmonary edema. Left lower lobe findings favor aspiration or pneumonia, which may be a separate process than the more diffuse ground-glass opacities. 2. Small right pleural effusion, not significantly changed. Development of diminutive left pleural effusion that may be reactive. New mediastinal adenopathy which is likely reactive. 3. Persistent but decreased perihepatic ascites. Liver appears prominent in size. Decompressed gallbladder with gallbladder wall thickening. Constellation of findings suggests chronic liver disease. 4. Questionable edema about the head of the pancreas. This  may be related to third spacing/ascites versus pancreatic inflammation. 5. Minimal fluid in the cecum and ascending colon without associated colonic wall thickening.   Electronically Signed   By: Jeb Levering M.D.   On: 12/10/2014 01:58   Ct Chest Wo Contrast  11/27/2014   CLINICAL DATA:  59 year old male with C difficile, elevated white count, altered mental status/encephalitis and diabetic ketoacidosis. Elevated LFTs and end-stage renal disease.  EXAM: CT CHEST, ABDOMEN AND PELVIS WITHOUT CONTRAST  TECHNIQUE: Multidetector CT imaging of the chest, abdomen and pelvis was performed following the standard protocol without IV contrast.  COMPARISON:  11/18/2014 and prior chest radiographs. 11/06/2014 abdominal ultrasound.  FINDINGS: CT CHEST FINDINGS  Mediastinum/Nodes: Mild pulmonary and moderate-heavy coronary artery calcifications noted. There is no evidence of thoracic aortic aneurysm or pericardial effusion. Shotty mediastinal lymph nodes are identified.  Lungs/Pleura: A small right pleural effusion is noted. Mild basilar atelectasis identified. Mild interstitial opacities within bilateral upper lobes noted, some with a tree-in-bud type appearance-question infection. There is no definite airspace disease, consolidation, suspicious nodule, mass or endobronchial/ endotracheal lesion.  Musculoskeletal: No acute or suspicious abnormalities.  CT ABDOMEN AND PELVIS FINDINGS  Please note that parenchymal abnormalities may be missed without intravenous contrast.  Hepatobiliary: The liver and gallbladder are unremarkable. There is no evidence of biliary dilatation.  Pancreas: Unremarkable  Spleen: Unremarkable  Adrenals/Urinary Tract: Mild bilateral renal atrophy noted. The adrenal glands and bladder are unremarkable.  Stomach/Bowel: There is no evidence of bowel obstruction or are definite bowel wall thickening. The appendix is normal.  Vascular/Lymphatic: No enlarged lymph nodes or abdominal aortic aneurysm. Mild  aortic atherosclerotic calcifications noted.  Reproductive: Prostate unremarkable.  Other: A small amount of ascites within the abdomen and pelvis noted. There is no evidence of pneumoperitoneum. A small umbilical hernia containing fat is identified. Mild subcutaneous edema is present.  Musculoskeletal: No acute or suspicious abnormalities.  IMPRESSION: Mild bilateral upper lobe interstitial opacities some with a tree-in-bud type appearance which may represent infection.  Small right pleural effusion, small amount of ascites and mild subcutaneous edema.  No evidence of bowel wall thickening.   Electronically Signed   By: Margarette Canada M.D.   On: 11/27/2014 15:22   Mr Brain Wo Contrast  12/10/2014   CLINICAL DATA:  Initial evaluation for acute encephalopathy.  EXAM: MRI HEAD WITHOUT CONTRAST  TECHNIQUE: Multiplanar, multiecho pulse sequences of the brain and surrounding structures were obtained without intravenous contrast.  COMPARISON:  Prior CT from earlier the same day as well as previous MRI from 11/26/2014.  FINDINGS: Diffuse prominence of the CSF containing spaces is compatible with generalized cerebral atrophy. Patchy and confluent T2/FLAIR hyperintensity within the periventricular and deep white matter both cerebral hemispheres again seen, most consistent with chronic small vessel ischemic disease. 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  No abnormal foci of restricted diffusion to suggest acute intracranial infarct. Gray-white matter differentiation maintained. Normal intravascular flow voids are preserved. No acute or chronic intracranial hemorrhage.  There is mildly hyperintense T2/FLAIR signal intensity involving the mesial temporal lobes/hippocampi bilaterally, similar relative to the initial MRI from 11/21/2014, but worsened relative to subsequent MRI from 11/26/2014. There is question of trace associated restricted diffusion within knee  right hippocampus (series 7, image 17). No other significant diffusion-weighted signal abnormality.  No mass lesion, midline shift, or mass effect. No hydrocephalus. No extra-axial fluid collection.  Craniocervical junction within normal limits. Pituitary gland normal.  No acute abnormality about the orbits.  Paranasal sinuses are clear. Mild scattered opacity present within the mastoid air cells bilaterally, slightly greater on the right. Inner ear structures grossly normal.  Bone marrow signal intensity within normal limits. Scalp soft tissues unremarkable.  IMPRESSION: 1. Persistent abnormal T2/FLAIR signal intensity involving the mesial temporal lobes/hippocampi bilaterally, with question of trace diffusion abnormality within the right hippocampus. While these findings appear overall improved relative to previous MRI from 11/21/2014, the T2/FLAIR signal abnormality appears slightly worsened relative to prior MRI from 11/26/2014 (although the diffusion changes are improved from that exam as well). 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 As before, herpes encephalitis could also have this appearance. 2. No other acute intracranial process. 3. 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   Electronically Signed   By: Jeannine Boga M.D.   On: 12/10/2014 05:50   Mr Brain Wo Contrast  11/26/2014   CLINICAL DATA:  Encephalitis.  Confusion.  EXAM: MRI HEAD WITHOUT CONTRAST  TECHNIQUE: Multiplanar, multiecho pulse sequences of the brain and surrounding structures were obtained without intravenous contrast.  COMPARISON:  MRI of the brain 11/21/2014.  FINDINGS: Restricted diffusion within the hippocampal structures bilaterally is somewhat less conspicuous than on the prior exam. Diffuse T2 signal remains in the medial temporal lobes. There is increased swelling in the left  medial temporal lobe compared to the prior exam. The right hippocampus is similar to the prior exam.  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 Scattered periventricular and subcortical T2 changes are otherwise stable. No other new areas of signal abnormality are evident.  Flow was present in the major intracranial arteries. The globes orbits are intact. The paranasal sinuses are clear. There is some fluid in the inferior mastoid air cells. No obstructing nasopharyngeal lesion is present. Skullbase is within normal limits. Midline structures are unremarkable.  IMPRESSION: 1. Diffusion abnormality in the medial temporal lobes and hippocampal structures is improving. 2. Swelling and T2 signal is more prominent in the left medial temporal lobe and hippocampus. This may be related to seizure activity given the abnormal EEG and negative lumbar puncture. 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   Electronically Signed   By: San Morelle M.D.   On: 11/26/2014 17:14   US Scrotum  12/12/2014   CLINICAL DATA:  End-stage renal disease. Pain. Acute encephalopathy and confusion.  EXAM: ULTRASOUND OF SCROTUM  TECHNIQUE: Complete ultrasound examination of the testicles, epididymis, and other scrotal structures was performed.  COMPARISON:  CT abdomen and pelvis 12/10/2014  FINDINGS: Right testicle  Measurements: 3.1 x 1.6 x 2.4 cm. No mass or microlithiasis visualized.  Left testicle  Measurements: 1.9 x 1.8 x 2.3 cm. Focal intra  testicular calcification measuring 1.2 mm, likely benign. No mass identified.  Right epididymis:  Normal in size and appearance.  Left epididymis:  Normal in size and appearance.  Hydrocele:  None visualized.  Varicocele:  Borderline left scrotal varicoceles demonstrated.  Color flow Doppler images demonstrate normal homogeneous flow to both testes.  IMPRESSION: Focal testicular calcification on the left. No mass or parenchymal distortion in either testis. No evidence  of epididymal orchitis. Borderline left scrotal varicoceles.   Electronically Signed   By: Lucienne Capers M.D.   On: 12/12/2014 20:27   Ir Fluoro Guide Cv Line Right  12/17/2014   CLINICAL DATA:  Acute encephalopathy, needs access for pheresis.  EXAM: EXAM RIGHT IJ CATHETER PLACEMENT UNDER ULTRASOUND AND FLUOROSCOPIC GUIDANCE  TECHNIQUE: The procedure, risks (including but not limited to bleeding, infection, organ damage, pneumothorax), benefits, and alternatives were explained to the spouse. Questions regarding the procedure were encouraged and answered. The spouse understands and consents to the procedure. Patency of the right IJ vein was confirmed with ultrasound with image documentation. An appropriate skin site was determined. Skin site was marked. Region was prepped using maximum barrier technique including cap and mask, sterile gown, sterile gloves, large sterile sheet, and Chlorhexidine as cutaneous antisepsis. The region was infiltrated locally with 1% lidocaine. Under real-time ultrasound guidance, the right IJ vein was accessed with a 19 gauge needle; the needle tip within the vein was confirmed with ultrasound image documentation. The needle exchanged over a guidewire for vascular dilator which allowed advancement of a 20 cm Trialysis catheter. This was positioned with the tip at the mid right atrium. Spot chest radiograph shows good positioning and no pneumothorax. Catheter was flushed and sutured externally with 0-Prolene sutures. Patient tolerated the procedure well.  FLUOROSCOPY TIME:  6 seconds, 2.4 mGy  COMPLICATIONS: COMPLICATIONS none  IMPRESSION: 1. Technically successful right IJ Trialysis catheter placement.   Electronically Signed   By: Lucrezia Europe M.D.   On: 12/17/2014 15:21   Ir US Guide Vasc Access Right  12/17/2014   CLINICAL DATA:  Acute encephalopathy, needs access for pheresis.  EXAM: EXAM RIGHT IJ CATHETER PLACEMENT UNDER ULTRASOUND AND FLUOROSCOPIC GUIDANCE  TECHNIQUE: The  procedure, risks (including but not limited to bleeding, infection, organ damage, pneumothorax), benefits, and alternatives were explained to the spouse. Questions regarding the procedure were encouraged and answered. The spouse understands and consents to the procedure. Patency of the right IJ vein was confirmed with ultrasound with image documentation. An appropriate skin site was determined. Skin site was marked. Region was prepped using maximum barrier technique including cap and mask, sterile gown, sterile gloves, large sterile sheet, and Chlorhexidine as cutaneous antisepsis. The region was infiltrated locally with 1% lidocaine. Under real-time ultrasound guidance, the right IJ vein was accessed with a 19 gauge needle; the needle tip within the vein was confirmed with ultrasound image documentation. The needle exchanged over a guidewire for vascular dilator which allowed advancement of a 20 cm Trialysis catheter. This was positioned with the tip at the mid right atrium. Spot chest radiograph shows good positioning and no pneumothorax. Catheter was flushed and sutured externally with 0-Prolene sutures. Patient tolerated the procedure well.  FLUOROSCOPY TIME:  6 seconds, 2.4 mGy  COMPLICATIONS: COMPLICATIONS none  IMPRESSION: 1. Technically successful right IJ Trialysis catheter placement.   Electronically Signed   By: Lucrezia Europe M.D.   On: 12/17/2014 15:21   Ir US Guide Vasc Access Right  11/26/2014   CLINICAL DATA:  Atrial fibrillation  EXAM: RIGHT JUGULAR TUNNELED PICC LINE PLACEMENT WITH ULTRASOUND AND FLUOROSCOPIC GUIDANCE  FLUOROSCOPY TIME:  18 seconds in  PROCEDURE: The patient was advised of the possible risks andcomplications and agreed to undergo the procedure. The patient was then brought to the angiographic suite for the procedure.  The right neck was prepped with chlorhexidine, drapedin the usual sterile fashion using maximum barrier technique (cap and mask, sterile gown, sterile gloves, large  sterile sheet, hand hygiene and cutaneous antisepsis) and infiltrated locally with 1% Lidocaine.  Ultrasound demonstrated patency of the right internal jugular vein, and this was documented with an image. Under real-time ultrasound guidance, this vein was accessed with a 21 gauge micropuncture needle and image documentation was performed. A 0.018 wire was introduced in to the vein. Over this, a 5 [place] double lumen Power tunneled PICC was advanced to the lower SVC/right atrial junction. The cuff was positioned in the subcutaneous tract. Fluoroscopy during the procedure and fluoro spot radiograph confirms appropriate catheter position. The catheter was flushed and covered with asterile dressing.  Catheter length: 21 cm  COMPLICATIONS: None  IMPRESSION: Successful right jugular tunneled power PICC line placement with ultrasound and fluoroscopic guidance. The catheter is ready for use.   Electronically Signed   By: Marybelle Killings M.D.   On: 11/26/2014 16:15   Dg Chest Port 1 View  12/16/2014   CLINICAL DATA:  Pneumonia, persistent leukocytosis, history atrial fibrillation, hypertension, diabetes mellitus, CHF, former smoker, end-stage renal disease on dialysis  EXAM: PORTABLE CHEST - 1 VIEW  COMPARISON:  Portable exam 1429 hours compared 12/12/2014  FINDINGS: Enlargement of cardiac silhouette with vascular congestion.  Mediastinal contour stable.  BILATERAL perihilar and basilar infiltrates question pulmonary edema versus infection.  Probable bibasilar effusions.  No pneumothorax.  IMPRESSION: Enlargement of cardiac silhouette with pulmonary vascular congestion.  Perihilar and basilar infiltrates with associated pleural effusions, question pulmonary edema versus pneumonia.   Electronically Signed   By: Lavonia Dana M.D.   On: 12/16/2014 14:55   Dg Chest Port 1 View  12/12/2014   CLINICAL DATA:  Health care associated pneumonia.  EXAM: PORTABLE CHEST - 1 VIEW  COMPARISON:  December 09, 2014.  FINDINGS: Stable  cardiomediastinal silhouette. No pneumothorax is noted. Right lung is clear. Stable left basilar opacity is noted most consistent with pneumonia. No significant pleural effusion is noted. Bony thorax appears intact.  IMPRESSION: Stable left basilar opacity is noted most consistent with pneumonia. Continued radiographic follow-up is recommended.   Electronically Signed   By: Marijo Conception, M.D.   On: 12/12/2014 07:24   Dg Chest Port 1 View  12/01/2014   CLINICAL DATA:  59 year old male with history of cough.  EXAM: PORTABLE CHEST - 1 VIEW  COMPARISON:  Chest x-ray 03/2015.  FINDINGS: There is a right-sided internal jugular central venous catheter with tip terminating in the superior cavoatrial junction. Lung volumes are slightly low. Diffuse interstitial prominence and peribronchial cuffing. No confluent consolidative airspace disease. Cephalization of the pulmonary vasculature. Mild cardiomegaly. The patient is rotated to the right on today's exam, resulting in distortion of the mediastinal contours and reduced diagnostic sensitivity and specificity for mediastinal pathology. Atherosclerosis in the thoracic aorta.  IMPRESSION: 1. Support apparatus, as above. 2. Overall, the appearance the chest suggests mild congestive heart failure. The possibility of concurrent bronchitis is not excluded, but is unlikely unless there is a history of fever and worsening purulent sputum production. 3. Previously described right lower lobe infiltrate is less apparent, suggesting that  this was atelectatic on the prior study from 11/30/2014.   Electronically Signed   By: Vinnie Langton M.D.   On: 12/01/2014 09:57   Dg Chest Port 1 View  11/30/2014   CLINICAL DATA:  Cough, shortness of breath, history hypertension, diabetes mellitus, CHF, chronic kidney disease, atrial fibrillation  EXAM: PORTABLE CHEST - 1 VIEW  COMPARISON:  Portable exam 1238 hours compared to 11/18/2014  FINDINGS: RIGHT jugular central venous catheter with  tip projecting over SVC near cavoatrial junction.  Upper normal heart size.  Mediastinal contours and pulmonary vascularity normal.  Infiltrate RIGHT lower lobe.  Remaining lungs clear.  No pleural effusion or pneumothorax.  IMPRESSION: RIGHT lower lobe infiltrate consistent with pneumonia.  No pneumothorax following central line placement.   Electronically Signed   By: Lavonia Dana M.D.   On: 11/30/2014 13:01   Ir Fluoro Guide Cv Midline Picc Right  11/26/2014   CLINICAL DATA:  Atrial fibrillation  EXAM: RIGHT JUGULAR TUNNELED PICC LINE PLACEMENT WITH ULTRASOUND AND FLUOROSCOPIC GUIDANCE  FLUOROSCOPY TIME:  18 seconds in  PROCEDURE: The patient was advised of the possible risks andcomplications and agreed to undergo the procedure. The patient was then brought to the angiographic suite for the procedure.  The right neck was prepped with chlorhexidine, drapedin the usual sterile fashion using maximum barrier technique (cap and mask, sterile gown, sterile gloves, large sterile sheet, hand hygiene and cutaneous antisepsis) and infiltrated locally with 1% Lidocaine.  Ultrasound demonstrated patency of the right internal jugular vein, and this was documented with an image. Under real-time ultrasound guidance, this vein was accessed with a 21 gauge micropuncture needle and image documentation was performed. A 0.018 wire was introduced in to the vein. Over this, a 5 [place] double lumen Power tunneled PICC was advanced to the lower SVC/right atrial junction. The cuff was positioned in the subcutaneous tract. Fluoroscopy during the procedure and fluoro spot radiograph confirms appropriate catheter position. The catheter was flushed and covered with asterile dressing.  Catheter length: 21 cm  COMPLICATIONS: None  IMPRESSION: Successful right jugular tunneled power PICC line placement with ultrasound and fluoroscopic guidance. The catheter is ready for use.   Electronically Signed   By: Marybelle Killings M.D.   On: 11/26/2014  16:15    Microbiology: Recent Results (from the past 240 hour(s))  C difficile quick scan w PCR reflex     Status: None   Collection Time: 12/17/14  7:46 PM  Result Value Ref Range Status   C Diff antigen NEGATIVE NEGATIVE Final   C Diff toxin NEGATIVE NEGATIVE Final   C Diff interpretation Negative for toxigenic C. difficile  Final  Culture, blood (routine x 2)     Status: None   Collection Time: 12/18/14 10:37 AM  Result Value Ref Range Status   Specimen Description BLOOD RIGHT HAND  Final   Special Requests BOTTLES DRAWN AEROBIC ONLY 2CC  Final   Culture NO GROWTH 5 DAYS  Final   Report Status 12/23/2014 FINAL  Final  Culture, blood (routine x 2)     Status: None   Collection Time: 12/18/14 12:15 PM  Result Value Ref Range Status   Specimen Description BLOOD RIGHT HAND  Final   Special Requests BOTTLES DRAWN AEROBIC AND ANAEROBIC 5CC  Final   Culture NO GROWTH 5 DAYS  Final   Report Status 12/23/2014 FINAL  Final     Labs: Basic Metabolic Panel:  Recent Labs Lab 12/22/14 1744 12/22/14 1856 12/23/14 0545 12/24/14 0600  12/24/14 1530 12/25/14 0712  NA 134* 139 138 137 136 137  K 3.4* 3.3* 3.6 3.7 3.7 3.4*  CL 101 98* 107 106 105 100*  CO2 22  --  '24 22 22 27  ' GLUCOSE 216* 124* 236* 92 277* 121*  BUN 26* 13 17 28* 34* 15  CREATININE 4.35* 2.40* 3.09* 4.54* 4.88* 3.20*  CALCIUM 8.6*  --  8.3* 8.6* 8.3* 8.4*  PHOS 3.2  --  3.2 3.6 3.7 3.2   Liver Function Tests:  Recent Labs Lab 12/22/14 1744 12/23/14 0545 12/24/14 0600 12/24/14 1530 12/25/14 0712  ALBUMIN 3.6 3.8 3.6 3.2* 3.5   No results for input(s): LIPASE, AMYLASE in the last 168 hours. No results for input(s): AMMONIA in the last 168 hours. CBC:  Recent Labs Lab 12/22/14 0516 12/22/14 1745 12/22/14 1856 12/23/14 0545 12/24/14 1530 12/25/14 0724  WBC 15.5* 14.3*  --  14.1* 15.9* 13.9*  NEUTROABS  --   --   --   --  11.7*  --   HGB 8.2* 7.8* 9.5* 7.7* 7.0* 7.6*  HCT 25.0* 23.9* 28.0* 23.9*  22.2* 24.4*  MCV 94.0 93.0  --  93.7 95.3 96.4  PLT 289 267  --  229 202 206   Cardiac Enzymes: No results for input(s): CKTOTAL, CKMB, CKMBINDEX, TROPONINI in the last 168 hours. BNP: BNP (last 3 results)  Recent Labs  11/05/14 2205  BNP 3626.3*    ProBNP (last 3 results)  Recent Labs  02/20/14 1515 02/24/14 1145 11/15/14 1622  PROBNP 15468.0* 10410.0* >4940.0*    CBG:  Recent Labs Lab 12/24/14 1215 12/24/14 2019 12/24/14 2120 12/25/14 0851 12/25/14 1212  GLUCAP 177* 98 102* 163* 109*    Signed:  CHIU, STEPHEN K  [facility] 12/25/2014, 5:20 PM

## 2014-12-25 NOTE — Care Management Note (Signed)
Case Management Note  Patient Details  Name: Joseph [name] MRN: [id number] Date of Birth: 05/26/1955  Subjective/Objective:      NCM received message from [place] with CIR, patient has been approved by his insurance to go to [facility] , he is scheduled to go tomorrow 9/7.                Action/Plan:   Expected Discharge Date:                  Expected Discharge Plan:  IP Rehab Facility  In-House Referral:  Clinical Social Work  Discharge planning Services  CM Consult  Post Acute Care Choice:    Choice offered to:     DME Arranged:    DME Agency:     HH Arranged:    [place] Agency:     Status of Service:  Completed, signed off  Medicare Important Message Given:  Yes-fourth notification given Date Medicare IM Given:    Medicare IM give by:    Date Additional Medicare IM Given:    Additional Medicare Important Message give by:     If discussed at [place] of Stay Meetings, dates discussed:    Additional Comments:  [name], RN 12/25/2014, 4:25 PM

## 2014-12-25 NOTE — Clinical Social Work Note (Signed)
Offers given to patient at bedside. CSW has left message for patient's wife regarding bed offers as well.   Liz Beach MSW, [place], [place], [phone]

## 2014-12-25 NOTE — Progress Notes (Signed)
Physical Therapy Treatment Patient Details Name: Joseph [name] MRN: [id number] DOB: [birth year] Today's Date: 12/25/2014    History of Present Illness 59 y.o. male readmission with fever cough HCAP, anemia, and MRI abnormal t2-flair involving temporal lobes/ hippocami bilaterally with question of trace diffusion abnormally within R hippocampus. MRI reveals remote L PCA infarct. Pt with recent d/c 11/30/14 PMH: CDIFF, limbic encephalitis, ESRD, DM, CHF, CKD, PNA, HTN, cardioversion, AV fistula L forearm, R heart catheterization    PT Comments    Pt with possible transfer to CIR today. Goals updated.  Follow Up Recommendations  CIR     Equipment Recommendations  None recommended by PT    Recommendations for Other Services       Precautions / Restrictions Precautions Precautions: Fall Restrictions Weight Bearing Restrictions: No    Mobility  Bed Mobility               General bed mobility comments: Pt on BSC on arrival.  Transfers   Equipment used: Rolling walker (2 wheeled)   Sit to Stand: Min assist Stand pivot transfers: Min assist       General transfer comment: verbal cues for hand placement and safety  Ambulation/Gait Ambulation/Gait assistance: Min assist Ambulation Distance (Feet): 25 Feet (x 2) Assistive device: Rolling walker (2 wheeled) Gait Pattern/deviations: Step-through pattern;Decreased stride length;Trunk flexed   Gait velocity interpretation: Below normal speed for age/gender General Gait Details: verbal cues for safety and sequencing   Stairs            Wheelchair Mobility    Modified Rankin (Stroke Patients Only)       Balance                                    Cognition Arousal/Alertness: Awake/alert Behavior During Therapy: WFL for tasks assessed/performed Overall Cognitive Status: Impaired/Different from baseline Area of Impairment: Orientation;Memory Orientation Level: Disoriented to;Time   Memory:  Decreased short-term memory Following Commands: Follows one step commands consistently Safety/Judgement: Decreased awareness of safety   Problem Solving: Slow processing;Decreased initiation      Exercises General Exercises - Lower Extremity Long Arc Quad: AROM;10 reps;Right;Left;Seated (2 sets) Hip Flexion/Marching: AROM;Right;Left;10 reps;Standing (2 sets)    General Comments        Pertinent Vitals/Pain Pain Assessment: No/denies pain    Home Living                      Prior Function            PT Goals (current goals can now be found in the care plan section) Acute Rehab PT Goals Patient Stated Goal: get back to working in his yard PT Goal Formulation: With patient Time For Goal Achievement: 01/08/15 Potential to Achieve Goals: Good Progress towards PT goals: Goals met and updated - see care plan;Progressing toward goals    Frequency  Min 3X/week    PT Plan Current plan remains appropriate    Co-evaluation             End of Session Equipment Utilized During Treatment: Gait belt;Oxygen Activity Tolerance: Patient tolerated treatment well Patient left: in chair;with call bell/phone within reach;with chair alarm set     Time: 8472-0721 PT Time Calculation (min) (ACUTE ONLY): 26 min  Charges:  $Gait Training: 8-22 mins $Therapeutic Exercise: 8-22 mins  G Codes:      Lorriane Shire 12/25/2014, 9:41 AM

## 2014-12-25 NOTE — Progress Notes (Signed)
ANTICOAGULATION CONSULT NOTE - Follow Up Consult  Pharmacy Consult for Coumadin Indication: atrial fibrillation   Labs:  Recent Labs  12/22/14 1745 12/22/14 1856 12/23/14 0545 12/24/14 0600 12/24/14 1530 12/25/14 0712  HGB 7.8* 9.5* 7.7*  --  7.0*  --   HCT 23.9* 28.0* 23.9*  --  22.2*  --   PLT 267  --  229  --  202  --   LABPROT  --   --  21.4* 18.1*  --  17.1*  INR  --   --  1.86* 1.49  --  1.39  CREATININE  --  2.40* 3.09* 4.54* 4.88* PENDING    Assessment: 59 YOM on warfarin for hx Afib  Home dose = 5mg  daily, but admitted with elevated INR  INR 1.39 today  Goal of Therapy:  INR 2-3   Plan:  - Coumadin 6 mg po x 1 dose today - Daily INR, mon s/sx bleeding  Levester Fresh, PharmD, BCPS Clinical Pharmacist Pager [phone] 12/25/2014 8:35 AM

## 2014-12-26 ENCOUNTER — Inpatient Hospital Stay (HOSPITAL_COMMUNITY)
Admission: RE | Admit: 2014-12-26 | Discharge: 2015-01-05 | DRG: 947 | Disposition: A | Discharge status: Home Health | Payer: 59 | Source: Intra-hospital | Attending: Physical Medicine & Rehabilitation | Admitting: Physical Medicine & Rehabilitation

## 2014-12-26 DIAGNOSIS — F0390 Unspecified dementia without behavioral disturbance: Secondary | ICD-10-CM

## 2014-12-26 DIAGNOSIS — I4891 Unspecified atrial fibrillation: Secondary | ICD-10-CM | POA: Diagnosis present

## 2014-12-26 DIAGNOSIS — I481 Persistent atrial fibrillation: Secondary | ICD-10-CM | POA: Diagnosis not present

## 2014-12-26 DIAGNOSIS — G9340 Encephalopathy, unspecified: Secondary | ICD-10-CM | POA: Diagnosis not present

## 2014-12-26 DIAGNOSIS — Z794 Long term (current) use of insulin: Secondary | ICD-10-CM | POA: Diagnosis not present

## 2014-12-26 DIAGNOSIS — R5381 Other malaise: Principal | ICD-10-CM | POA: Diagnosis present

## 2014-12-26 DIAGNOSIS — G0490 Encephalitis and encephalomyelitis, unspecified: Secondary | ICD-10-CM | POA: Diagnosis not present

## 2014-12-26 DIAGNOSIS — E1142 Type 2 diabetes mellitus with diabetic polyneuropathy: Secondary | ICD-10-CM

## 2014-12-26 DIAGNOSIS — I120 Hypertensive chronic kidney disease with stage 5 chronic kidney disease or end stage renal disease: Secondary | ICD-10-CM

## 2014-12-26 DIAGNOSIS — N2581 Secondary hyperparathyroidism of renal origin: Secondary | ICD-10-CM

## 2014-12-26 DIAGNOSIS — R609 Edema, unspecified: Secondary | ICD-10-CM | POA: Diagnosis not present

## 2014-12-26 DIAGNOSIS — N186 End stage renal disease: Secondary | ICD-10-CM | POA: Diagnosis not present

## 2014-12-26 DIAGNOSIS — Z9289 Personal history of other medical treatment: Secondary | ICD-10-CM

## 2014-12-26 DIAGNOSIS — M898X9 Other specified disorders of bone, unspecified site: Secondary | ICD-10-CM | POA: Diagnosis not present

## 2014-12-26 DIAGNOSIS — Z7901 Long term (current) use of anticoagulants: Secondary | ICD-10-CM | POA: Diagnosis not present

## 2014-12-26 DIAGNOSIS — Z992 Dependence on renal dialysis: Secondary | ICD-10-CM

## 2014-12-26 DIAGNOSIS — D649 Anemia, unspecified: Secondary | ICD-10-CM | POA: Diagnosis not present

## 2014-12-26 DIAGNOSIS — I482 Chronic atrial fibrillation: Secondary | ICD-10-CM | POA: Diagnosis not present

## 2014-12-26 DIAGNOSIS — J189 Pneumonia, unspecified organism: Secondary | ICD-10-CM

## 2014-12-26 DIAGNOSIS — I959 Hypotension, unspecified: Secondary | ICD-10-CM | POA: Diagnosis not present

## 2014-12-26 DIAGNOSIS — F4322 Adjustment disorder with anxiety: Secondary | ICD-10-CM

## 2014-12-26 DIAGNOSIS — A419 Sepsis, unspecified organism: Secondary | ICD-10-CM

## 2014-12-26 DIAGNOSIS — Z87891 Personal history of nicotine dependence: Secondary | ICD-10-CM | POA: Diagnosis not present

## 2014-12-26 DIAGNOSIS — Z79899 Other long term (current) drug therapy: Secondary | ICD-10-CM | POA: Diagnosis not present

## 2014-12-26 DIAGNOSIS — G40909 Epilepsy, unspecified, not intractable, without status epilepticus: Secondary | ICD-10-CM

## 2014-12-26 LAB — PROTIME-INR
INR: 1.66 — AB (ref 0.00–1.49)
PROTHROMBIN TIME: 19.6 s — AB (ref 11.6–15.2)

## 2014-12-26 LAB — RENAL FUNCTION PANEL
Albumin: 3.2 g/dL — ABNORMAL LOW (ref 3.5–5.0)
Anion gap: 10 (ref 5–15)
BUN: 31 mg/dL — AB (ref 6–20)
CALCIUM: 8.5 mg/dL — AB (ref 8.9–10.3)
CHLORIDE: 97 mmol/L — AB (ref 101–111)
CO2: 27 mmol/L (ref 22–32)
CREATININE: 4.56 mg/dL — AB (ref 0.61–1.24)
GFR, EST AFRICAN AMERICAN: 15 mL/min — AB (ref >60)
GFR, EST NON AFRICAN AMERICAN: 13 mL/min — AB (ref >60)
Glucose, Bld: 151 mg/dL — ABNORMAL HIGH (ref 65–99)
Phosphorus: 3.8 mg/dL (ref 2.5–4.6)
Potassium: 3.5 mmol/L (ref 3.5–5.1)
SODIUM: 134 mmol/L — AB (ref 135–145)

## 2014-12-26 LAB — BASIC METABOLIC PANEL
Anion gap: 12 (ref 5–15)
BUN: 36 mg/dL — ABNORMAL HIGH (ref 6–20)
CO2: 25 mmol/L (ref 22–32)
Calcium: 8.1 mg/dL — ABNORMAL LOW (ref 8.9–10.3)
Chloride: 94 mmol/L — ABNORMAL LOW (ref 101–111)
Creatinine, Ser: 5.09 mg/dL — ABNORMAL HIGH (ref 0.61–1.24)
GFR calc Af Amer: 13 mL/min — ABNORMAL LOW (ref >60)
GFR calc non Af Amer: 11 mL/min — ABNORMAL LOW (ref >60)
Glucose, Bld: 230 mg/dL — ABNORMAL HIGH (ref 65–99)
Potassium: 3.2 mmol/L — ABNORMAL LOW (ref 3.5–5.1)
Sodium: 131 mmol/L — ABNORMAL LOW (ref 135–145)

## 2014-12-26 LAB — CBC
HCT: 23.5 % — ABNORMAL LOW (ref 39.0–52.0)
Hemoglobin: 7.5 g/dL — ABNORMAL LOW (ref 13.0–17.0)
MCH: 31 pg (ref 26.0–34.0)
MCHC: 31.9 g/dL (ref 30.0–36.0)
MCV: 97.1 fL (ref 78.0–100.0)
Platelets: 207 10*3/uL (ref 150–400)
RBC: 2.42 MIL/uL — ABNORMAL LOW (ref 4.22–5.81)
RDW: 22.2 % — ABNORMAL HIGH (ref 11.5–15.5)
WBC: 14.7 10*3/uL — ABNORMAL HIGH (ref 4.0–10.5)

## 2014-12-26 LAB — IRON AND TIBC: Iron: 46 ug/dL (ref 45–182)

## 2014-12-26 LAB — GLUCOSE, CAPILLARY
GLUCOSE-CAPILLARY: 127 mg/dL — AB (ref 65–99)
Glucose-Capillary: 167 mg/dL — ABNORMAL HIGH (ref 65–99)
Glucose-Capillary: 205 mg/dL — ABNORMAL HIGH (ref 65–99)

## 2014-12-26 MED ORDER — BENZONATATE 100 MG PO CAPS: 200.0000 mg | ORAL_CAPSULE | Freq: Three times a day (TID) | ORAL | Status: DC | PRN

## 2014-12-26 MED ORDER — ONDANSETRON HCL 4 MG PO TABS
4.0000 mg | ORAL_TABLET | Freq: Four times a day (QID) | ORAL | Status: DC | PRN
Start: 2014-12-26 — End: 2015-01-05

## 2014-12-26 MED ORDER — WARFARIN SODIUM 6 MG PO TABS
6.0000 mg | ORAL_TABLET | Freq: Once | ORAL | Status: AC
  Administered 2014-12-26: 6 mg via ORAL
  Filled 2014-12-26: qty 1

## 2014-12-26 MED ORDER — NAPHAZOLINE-PHENIRAMINE 0.025-0.3 % OP SOLN
2.0000 [drp] | Freq: Four times a day (QID) | OPHTHALMIC | Status: DC | PRN
Start: 2014-12-26 — End: 2015-01-05

## 2014-12-26 MED ORDER — CALCITRIOL 0.5 MCG PO CAPS
1.7500 ug | ORAL_CAPSULE | ORAL | Status: DC
  Administered 2014-12-28 – 2015-01-04 (×5): 1.75 ug via ORAL
  Filled 2014-12-26 (×4): qty 1

## 2014-12-26 MED ORDER — RENA-VITE PO TABS
1.0000 | ORAL_TABLET | Freq: Every day | ORAL | Status: DC
  Administered 2014-12-26 – 2015-01-04 (×10): 1 via ORAL
  Filled 2014-12-26 (×10): qty 1

## 2014-12-26 MED ORDER — PHENYTOIN SODIUM EXTENDED 100 MG PO CAPS
300.0000 mg | ORAL_CAPSULE | Freq: Every morning | ORAL | Status: DC
  Administered 2014-12-27 – 2015-01-04 (×9): 300 mg via ORAL
  Filled 2014-12-26 (×11): qty 3

## 2014-12-26 MED ORDER — DARBEPOETIN ALFA 150 MCG/0.3ML IJ SOSY: 150.0000 ug | PREFILLED_SYRINGE | INTRAMUSCULAR | Status: DC

## 2014-12-26 MED ORDER — SACCHAROMYCES BOULARDII 250 MG PO CAPS
250.0000 mg | ORAL_CAPSULE | Freq: Two times a day (BID) | ORAL | Status: DC
  Administered 2014-12-26 – 2015-01-05 (×20): 250 mg via ORAL
  Filled 2014-12-26 (×20): qty 1

## 2014-12-26 MED ORDER — SORBITOL 70 % SOLN: 30.0000 mL | Freq: Every day | Status: DC | PRN

## 2014-12-26 MED ORDER — INSULIN GLARGINE 100 UNIT/ML ~~LOC~~ SOLN
8.0000 [IU] | Freq: Every day | SUBCUTANEOUS | Status: DC
  Administered 2014-12-26 – 2014-12-27 (×2): 8 [IU] via SUBCUTANEOUS
  Filled 2014-12-26 (×3): qty 0.08

## 2014-12-26 MED ORDER — ACETAMINOPHEN 325 MG PO TABS
650.0000 mg | ORAL_TABLET | ORAL | Status: DC | PRN
  Administered 2014-12-29 – 2015-01-03 (×4): 650 mg via ORAL
  Filled 2014-12-26 (×4): qty 2

## 2014-12-26 MED ORDER — ONDANSETRON HCL 4 MG/2ML IJ SOLN: 4.0000 mg | Freq: Four times a day (QID) | INTRAMUSCULAR | Status: DC | PRN

## 2014-12-26 MED ORDER — WARFARIN SODIUM 6 MG PO TABS
6.0000 mg | ORAL_TABLET | Freq: Once | ORAL | Status: DC
  Filled 2014-12-26: qty 1

## 2014-12-26 MED ORDER — COLCHICINE 0.6 MG PO TABS
0.6000 mg | ORAL_TABLET | Freq: Every day | ORAL | Status: DC
  Administered 2014-12-26 – 2015-01-05 (×11): 0.6 mg via ORAL
  Filled 2014-12-26 (×12): qty 1

## 2014-12-26 MED ORDER — NYSTATIN 100000 UNIT/GM EX POWD
Freq: Two times a day (BID) | CUTANEOUS | Status: DC
  Administered 2014-12-26 – 2015-01-05 (×14): via TOPICAL
  Filled 2014-12-26: qty 15

## 2014-12-26 MED ORDER — HEPARIN SODIUM (PORCINE) 1000 UNIT/ML IJ SOLN
1000.0000 [IU] | Freq: Once | INTRAMUSCULAR | Status: DC
  Filled 2014-12-26: qty 1

## 2014-12-26 MED ORDER — WARFARIN - PHARMACIST DOSING INPATIENT
Freq: Every day | Status: DC
  Administered 2014-12-29 – 2014-12-31 (×3)

## 2014-12-26 MED ORDER — AMIODARONE HCL 200 MG PO TABS
400.0000 mg | ORAL_TABLET | Freq: Every day | ORAL | Status: DC
  Administered 2014-12-26 – 2015-01-05 (×9): 400 mg via ORAL
  Filled 2014-12-26 (×9): qty 2

## 2014-12-26 MED ORDER — FLUOCINONIDE 0.05 % EX OINT
TOPICAL_OINTMENT | Freq: Four times a day (QID) | CUTANEOUS | Status: DC
  Administered 2014-12-26 – 2014-12-28 (×5): via TOPICAL
  Administered 2014-12-29: 1 via TOPICAL
  Administered 2014-12-29 – 2015-01-05 (×16): via TOPICAL
  Filled 2014-12-26 (×3): qty 15

## 2014-12-26 MED ORDER — METOPROLOL TARTRATE 50 MG PO TABS
75.0000 mg | ORAL_TABLET | Freq: Two times a day (BID) | ORAL | Status: DC
  Administered 2014-12-26 – 2014-12-27 (×2): 75 mg via ORAL
  Filled 2014-12-26 (×4): qty 1

## 2014-12-26 MED ORDER — NEPRO/CARBSTEADY PO LIQD
237.0000 mL | Freq: Two times a day (BID) | ORAL | Status: DC
  Administered 2014-12-27 – 2014-12-30 (×6): 237 mL via ORAL
  Filled 2014-12-26: qty 237

## 2014-12-26 MED ORDER — INSULIN ASPART 100 UNIT/ML ~~LOC~~ SOLN
0.0000 [IU] | Freq: Three times a day (TID) | SUBCUTANEOUS | Status: DC
  Administered 2014-12-26: 2 [IU] via SUBCUTANEOUS
  Administered 2014-12-27 – 2014-12-28 (×3): 3 [IU] via SUBCUTANEOUS
  Administered 2014-12-28: 5 [IU] via SUBCUTANEOUS
  Administered 2014-12-29: 3 [IU] via SUBCUTANEOUS
  Administered 2014-12-29: 5 [IU] via SUBCUTANEOUS
  Administered 2014-12-29: 2 [IU] via SUBCUTANEOUS
  Administered 2014-12-30: 8 [IU] via SUBCUTANEOUS
  Administered 2014-12-30: 5 [IU] via SUBCUTANEOUS
  Administered 2014-12-31: 3 [IU] via SUBCUTANEOUS
  Administered 2014-12-31: 2 [IU] via SUBCUTANEOUS
  Administered 2014-12-31 – 2015-01-01 (×2): 3 [IU] via SUBCUTANEOUS
  Administered 2015-01-01: 5 [IU] via SUBCUTANEOUS
  Administered 2015-01-02 – 2015-01-03 (×3): 3 [IU] via SUBCUTANEOUS
  Administered 2015-01-04: 5 [IU] via SUBCUTANEOUS
  Administered 2015-01-05: 2 [IU] via SUBCUTANEOUS

## 2014-12-26 MED ORDER — DILTIAZEM HCL 30 MG PO TABS
30.0000 mg | ORAL_TABLET | Freq: Four times a day (QID) | ORAL | Status: DC
  Administered 2014-12-27 – 2014-12-30 (×10): 30 mg via ORAL
  Filled 2014-12-26 (×19): qty 1

## 2014-12-26 MED ORDER — HEPARIN SODIUM (PORCINE) 1000 UNIT/ML DIALYSIS
1000.0000 [IU] | INTRAMUSCULAR | Status: DC | PRN
  Filled 2014-12-26: qty 1

## 2014-12-26 MED ORDER — GUAIFENESIN ER 600 MG PO TB12
600.0000 mg | ORAL_TABLET | Freq: Two times a day (BID) | ORAL | Status: DC
  Administered 2014-12-26 – 2015-01-05 (×20): 600 mg via ORAL
  Filled 2014-12-26 (×22): qty 1

## 2014-12-26 MED ORDER — ATORVASTATIN CALCIUM 40 MG PO TABS
40.0000 mg | ORAL_TABLET | Freq: Every day | ORAL | Status: DC
  Administered 2014-12-26 – 2015-01-04 (×10): 40 mg via ORAL
  Filled 2014-12-26 (×10): qty 1

## 2014-12-26 NOTE — Progress Notes (Signed)
PMR Admission Coordinator Pre-Admission Assessment  Patient: Joseph [name] is an 59 y.o., male MRN: [id number] DOB: Apr 17, 1956 Height: 6' (182.9 cm) Weight: 81.2 kg (179 lb 0.2 oz)  Insurance Information HMO: PPO: PCP: IPA: 80/20: OTHER: Choice Plus plan PRIMARY: [employer] Policy#: [id number] Subscriber: wife CM Name: Eulah Citizen Phone#: [phone] Fax#: EMR access Pre-Cert#: [id number] Employer: [employer]. Pt's insurance can not deny admission to AIR, But if at first review pt does not show tolerance or progress, Insurance MD will deny and request Lower level of Care ar SNF. Wife is aware. Benefits: Phone #: [phone] Name: 12/25/2014 Eff. Date: 04/20/13 Deduct: $450 Out of Pocket Max: $2000 Life Max: none CIR: $300 copay per admission then covers at 85% SNF: $300 copay per admission then 85% no day limit Outpatient: $30 copay per visit Co-Pay: no visit limit Home Health: 85% Co-Pay: no visit limit DME: 85% Co-Pay: 15% Providers: in network  SECONDARY: Medicare A Policy#: [id number] Subscriber: pt Wife works so [employer] is primary over [employer] Application Date: Case Manager:  Disability Application Date: Case Worker:   Emergency Facilities manager Information    Name Relation Home Work [employer] [phone]  [phone]     Current Medical History  Patient Admitting Diagnosis: Debility related to PNA with history limbic encephalitis and severe cognitive deficits  History of Present Illness: Joseph [name] is a 59 y.o. 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  He was readmitted on 12/09/14 with fever, N/V/D, persistent cough with mild hemoptysis, tachycardia and hypotension due to sepsis from LLL PNA. He was bolused with I liter IV fluids and started on IV Vanc and Zosyn and chronic coumadin changed to IV heparin. Patient with lethargy and MRI brain repeated showing " persistent abnormal flare involving the mesial temporal lobes/hippocampi bilaterally, with question of trace diffusion abnormality within the right hippocampus felt to represent ongoing encephalitis".Patient did receive plasma exchange 5 days since completed her neurology services. Stools checked for C diff and showed evidence of colonization and remains on contact precautions. Acute on chronic anemia as well asBouts of hemoptysis since resolved and Coumadin had been held for a short time and since resumed. He was receiving IV amiodarone and cardizem for control of heart rate and fluid overload managed by HD and has since been transitioned to by mouth.  Mentation has improved suspect acute encephalopathy but patient remains significantly deconditioned. PT/OT evaluations done yesterday and CIR recommended by MD and Rehab team. Patient was admitted for a comprehensive rehabilitation program.  Past Medical History  Past Medical History  Diagnosis Date  . Atrial fibrillation     a. 11/18/2011 s/p DCCV - 150J; b. Anticoagulation w/ Apixaban; c. 11/2011 back in afib->asymptomatic.  . H/O alcohol abuse     a. drinks heavily on the weekends.  . Hypertension   . Diabetes mellitus   . Heart murmur     a. 09/2011 Echo: EF 55-60%, Triv AI, Mild MR, mildly dil LA.  . Diabetic peripheral neuropathy   . CKD (chronic kidney disease), stage III   . CHF (congestive heart failure)   . Pneumonia   . Sleep apnea     Family History  family history includes  Diabetes in his father and sister; Heart disease in his mother;  Peripheral vascular disease in his sister. There is no history of Cancer or Stroke.  Prior Rehab/Hospitalizations:  Has the patient had major surgery during 100 days prior to admission? No  Current Medications   Current facility-administered medications:  . 0.9 % sodium chloride infusion, 100 mL, Intravenous, PRN, Edrick Oh, MD . 0.9 % sodium chloride infusion, 100 mL, Intravenous, PRN, Edrick Oh, MD . acetaminophen (TYLENOL) tablet 650 mg, 650 mg, Oral, Q4H PRN, Amie Portland, MD . albumin human 50 g in sodium chloride 0.9 %, , Dialysis, Once in dialysis, Amie Portland, MD . alteplase (CATHFLO ACTIVASE) injection 2 mg, 2 mg, Intracatheter, Once PRN, Edrick Oh, MD . amiodarone (PACERONE) tablet 400 mg, 400 mg, Oral, Daily, Mihai Croitoru, MD, 400 mg at 12/26/14 1011 . atorvastatin (LIPITOR) tablet 40 mg, 40 mg, Oral, QHS, Rise Patience, MD, 40 mg at 12/25/14 2219 . benzonatate (TESSALON) capsule 200 mg, 200 mg, Oral, TID PRN, Gardiner Barefoot, NP, 200 mg at 12/20/14 0106 . calcitRIOL (ROCALTROL) capsule 1.75 mcg, 1.75 mcg, Oral, Q M,W,F-HD, Rise Patience, MD, 1.75 mcg at 12/24/14 1847 . citrate dextrose (ACD-A anticoagulant) solution 500 mL, 500 mL, Intravenous, Continuous, Osvaldo A Camilo, MD . colchicine tablet 0.6 mg, 0.6 mg, Oral, Daily, Kinnie Feil, MD, 0.6 mg at 12/26/14 1010 . Darbepoetin Alfa (ARANESP) injection 150 mcg, 150 mcg, Intravenous, Q Mon-HD, [name], NP, 150 mcg at 12/24/14 1846 . diltiazem (CARDIZEM) tablet 30 mg, 30 mg, Oral, 4 times per day, Brett Canales, PA-C, 30 mg at 12/26/14 1010 . diphenhydrAMINE (BENADRYL) capsule 25 mg, 25 mg, Oral, Q6H PRN, Amie Portland, MD, 25 mg at 12/24/14 0559 . feeding supplement (NEPRO CARB STEADY) liquid 237 mL, 237 mL, Oral, BID BM, Rise Patience, MD, 237 mL at 12/26/14 1007 . fluocinonide ointment (LIDEX)  0.05 %, , Topical, QID, Rise Patience, MD . guaiFENesin (MUCINEX) 12 hr tablet 600 mg, 600 mg, Oral, BID, Nishant Dhungel, MD, 600 mg at 12/26/14 1010 . heparin injection 1,000 Units, 1,000 Units, Intracatheter, Once, Amie Portland, MD . heparin injection 1,000 Units, 1,000 Units, Intracatheter, Once, Amie Portland, MD . heparin injection 1,000 Units, 1,000 Units, Dialysis, PRN, Edrick Oh, MD . insulin aspart (novoLOG) injection 0-15 Units, 0-15 Units, Subcutaneous, TID WC, Nishant Dhungel, MD, 1 Units at 12/26/14 0900 . insulin aspart (novoLOG) injection 0-5 Units, 0-5 Units, Subcutaneous, QHS, Nishant Dhungel, MD, 3 Units at 12/25/14 2220 . insulin glargine (LANTUS) injection 8 Units, 8 Units, Subcutaneous, QHS, Nishant Dhungel, MD, 8 Units at 12/25/14 2221 . lidocaine (PF) (XYLOCAINE) 1 % injection 5 mL, 5 mL, Intradermal, PRN, Edrick Oh, MD . lidocaine-prilocaine (EMLA) cream 1 application, 1 application, Topical, PRN, Edrick Oh, MD . metoprolol tartrate (LOPRESSOR) tablet 75 mg, 75 mg, Oral, BID, Nishant Dhungel, MD, 75 mg at 12/26/14 1009 . multivitamin (RENA-VIT) tablet 1 tablet, 1 tablet, Oral, QHS, [name], NP, 1 tablet at 12/25/14 2222 . naphazoline-pheniramine (NAPHCON-A) 0.025-0.3 % ophthalmic solution 2 drop, 2 drop, Both Eyes, QID PRN, Donne Hazel, MD, 2 drop at 12/26/14 1013 . nystatin (MYCOSTATIN/NYSTOP) topical powder, , Topical, BID, Donne Hazel, MD . pentafluoroprop-tetrafluoroeth [facility]) aerosol 1 application, 1 application, Topical, PRN, Edrick Oh, MD . phenytoin (DILANTIN) ER capsule 300 mg, 300 mg, Oral, q morning - 10a, Rise Patience, MD, 300 mg at 12/26/14 1007 . saccharomyces boulardii (FLORASTOR) capsule 250 mg, 250 mg, Oral, BID, Rise Patience, MD, 250 mg at 12/26/14 1010 .  sodium chloride 0.9 % injection 10-40 mL, 10-40 mL, Intracatheter, PRN, Roney Jaffe, MD, 10 mL at 12/23/14 2214 . sodium chloride  0.9 % injection 10-40 mL, 10-40 mL, Intracatheter, Q12H, Donne Hazel, MD, 20 mL at 12/24/14 2200 . sodium chloride 0.9 % injection 10-40 mL, 10-40 mL, Intracatheter, PRN, Donne Hazel, MD, 10 mL at 12/26/14 0508 . sodium chloride 0.9 % injection 3 mL, 3 mL, Intravenous, Q12H, Rise Patience, MD, 3 mL at 12/26/14 1000 . warfarin (COUMADIN) tablet 6 mg, 6 mg, Oral, ONCE-1800, Wynell Balloon, RPH . Warfarin - Pharmacist Dosing Inpatient, , Does not apply, q1800, Nishant Dhungel, MD, 1 each at 12/25/14 1725  Patients Current Diet: Diet renal with fluid restriction Fluid restriction:: 1200 mL Fluid; Room service appropriate?: Yes; Fluid consistency:: Thin  Precautions / Restrictions Precautions Precautions: Fall Precaution Comments: oxygen needs, multiple lines/ leads Restrictions Weight Bearing Restrictions: No   Has the patient had 2 or more falls or a fall with injury in the past year?No  Prior Activity Level Community (5-7x/wk): gradual decline in function over last few months Pt independent and driving pta 07/16/49 when became deconditioned. Pt was the main cook for the couple, did his own laundry and managed his disability funds. Wife managed household funds. Pt managed his meds with a pill box system. Cognitive deficits new since most recent admission.  Home Assistive Devices / Equipment Home Assistive Devices/Equipment: CBG Meter, Dentures (specify type), Walker (specify type) Home Equipment: Cane - single point, Walker - 2 wheels  Prior Device Use: Indicate devices/aids used by the patient prior to current illness, exacerbation or injury? cane  Prior Functional Level Prior Function Level of Independence: Needs assistance Gait / Transfers Assistance Needed: Uses cane at times and recently requiring assist at times. ADL's / Homemaking Assistance Needed: recent dc from [facility] and wend to [facility] prior to readmission Comments: was not on O2 pta  Self  Care: Did the patient need help bathing, dressing, using the toilet or eating? Independent  Indoor Mobility: Did the patient need assistance with walking from room to room (with or without device)? Independent  Stairs: Did the patient need assistance with internal or external stairs (with or without device)? Independent  Functional Cognition: Did the patient need help planning regular tasks such as shopping or remembering to take medications? Independent  Current Functional Level Cognition  Overall Cognitive Status: Impaired/Different from baseline Current Attention Level: Selective Orientation Level: Oriented X4 Following Commands: Follows one step commands consistently Safety/Judgement: Decreased awareness of safety General Comments: Patient unable to state year/month/day. He states he is still working - has been on disability x2 years.   Extremity Assessment (includes Sensation/Coordination)  Upper Extremity Assessment: Defer to OT evaluation  Lower Extremity Assessment: Generalized weakness, RLE deficits/detail, LLE deficits/detail RLE Deficits / Details: grossly 3+/5, quads 4-/5 RLE Sensation: history of peripheral neuropathy LLE Deficits / Details: grossly 3+/5 LLE Sensation: history of peripheral neuropathy LLE Coordination: decreased fine motor    ADLs  Overall ADL's : Needs assistance/impaired Grooming: Wash/dry face, Wash/dry hands, Applying deodorant, Min guard, Sitting Grooming Details (indicate cue type and reason): requires rest breaks and cues for pursed lip breathing. HR reaching 118  Upper Body Bathing: Minimal assitance, Sitting Upper Body Bathing Details (indicate cue type and reason): requires rest breaks Lower Body Bathing: Min guard, Sit to/from stand (washed bottom and peri area) Lower Body Bathing Details (indicate cue type and reason): also applied powder to peri area Upper Body Dressing :  Moderate assistance, Sitting Lower Body Dressing:  Minimal assistance, Sit to/from stand Lower Body Dressing Details (indicate cue type and reason): assisted in threading underwear over LE. Toilet Transfer: Min guard, Ambulation, RW, BSC Functional mobility during ADLs: Min guard, Rolling walker General ADL Comments: Pt able to don socks and doff underwear. Assist to thread leg into underwear and OT also doffed sock towards end of session, but feel pt could have managed sock. Educated on LB dressing technique. Educated on energy conservation and deep breathing technique.     Mobility  Overal bed mobility: Needs Assistance Bed Mobility: Sit to Supine Supine to sit: Min guard General bed mobility comments: Pt on BSC on arrival.    Transfers  Overall transfer level: Needs assistance Equipment used: Rolling walker (2 wheeled) Transfers: Sit to/from Stand Sit to Stand: Min assist Stand pivot transfers: Min assist General transfer comment: verbal cues for hand placement and safety    Ambulation / Gait / Stairs / Wheelchair Mobility  Ambulation/Gait Ambulation/Gait assistance: Museum/gallery curator (Feet): 25 Feet (x 2) Assistive device: Rolling walker (2 wheeled) Gait Pattern/deviations: Step-through pattern, Decreased stride length, Trunk flexed General Gait Details: verbal cues for safety and sequencing Gait velocity: decreased Gait velocity interpretation: Below normal speed for age/gender    Posture / Balance Balance Overall balance assessment: Needs assistance Sitting-balance support: No upper extremity supported, Feet supported Sitting balance-Leahy Scale: Good Standing balance support: Bilateral upper extremity supported Standing balance-Leahy Scale: Poor Standing balance comment: Use of RW for balance    Special needs/care consideration  Dialysis MWF [place] used [facility] transportation Oxygen 3 liters nasal cannula not used pta Skin skin rash. Use cotton sheets only Bowel mgmt: LBM 9/7 continent Bladder  mgmt: continent Diabetic mgmt yes   Previous Home Environment Living Arrangements: Spouse/significant other Lives With: Spouse Available Help at Discharge: Family, Available 24 hours/day, Other (Comment) (wife to hires assist when she works and her Mother and siste) Type of Home: [place] Name: recent d/c to [facility] SNF after last admission Home Layout: One level Home Access: Level entry Bathroom Shower/Tub: Chiropodist: Standard Bathroom Accessibility: Yes How Accessible: Accessible via walker [place]: No  Discharge Living Setting Plans for Discharge Living Setting: Patient's home, Lives with (comment), Other (Comment) (wife) Type of Home at Discharge: House Discharge Home Layout: One level Discharge Home Access: Level entry Discharge Bathroom Shower/Tub: Tub/shower unit Discharge Bathroom Toilet: Standard Discharge Bathroom Accessibility: Yes How Accessible: Accessible via walker Does the patient have any problems obtaining your medications?: No  Social/Family/Support Systems Patient Roles: Spouse, Other (Comment) (No children) Contact Information: [place] Lo, wife Anticipated Caregiver: wife, hired caregivers, Mother in Sports coach and sister in Sports coach Anticipated Ambulance person Information: see above Ability/Limitations of Caregiver: wife works days but will arrange 24/7 supervision for d/c Caregiver Availability: 24/7 Discharge Plan Discussed with Primary Caregiver: Yes Is Caregiver In Agreement with Plan?: Yes Does Caregiver/Family have Issues with Lodging/Transportation while Pt is in Rehab?: No   Goals/Additional Needs Patient/Family Goal for Rehab: supervision PT and supervision to min assist OT and supervision SLP Expected length of stay: [facility]OS 10-14 days Equipment Needs: ESRD since 02/2014 MWF [place] transportation Pt/Family Agrees to Admission and willing to participate: Yes Program  Orientation Provided & Reviewed with Pt/Caregiver Including Roles & Responsibilities: Yes   Decrease burden of Care through IP rehab admission: n/a  Possible need for SNF placement upon discharge:not anticipated unless pt does not demonstrate tolerance  with inpt rehab intensity, [facility] will ask then for a lower level of care/SNF.  Patient Condition: This patient's medical and functional status has changed since the consult dated: 12/11/2014 in which the Rehabilitation Physician determined and documented that the patient's condition is appropriate for intensive rehabilitative care in an inpatient rehabilitation facility. See "History of Present Illness" (above) for medical update. Functional changes are: min to mod assist. Patient's medical and functional status update has been discussed with the Rehabilitation physician and patient remains appropriate for inpatient rehabilitation. Will admit to inpatient rehab today.  Preadmission Screen Completed By: Cleatrice Burke, 12/26/2014 10:21 AM ______________________________________________________________________  Discussed status with Dr. Naaman Plummer on 12/26/2014 at 1021 and received telephone approval for admission today.  Admission Coordinator: Cleatrice Burke, time 7373 Date 12/26/2014.          Cosigned by: Meredith Staggers, MD at 12/26/2014 11:34 AM  Revision History     Date/Time User Provider Type Action   12/26/2014 11:34 AM Meredith Staggers, MD Physician Cosign   12/26/2014 10:47 AM Cristina Gong, RN Rehab Admission Coordinator Sign   12/26/2014 10:22 AM Cristina Gong, RN Rehab Admission Coordinator Sign   View Details Report

## 2014-12-26 NOTE — Progress Notes (Signed)
Physical Medicine and Rehabilitation Consult  Reason for Consult: Debility due to sepsis from PNA, resolving encephalopathy from recent encephalitis with c diff colitis and nonspecific dermatitis.  Referring Physician: Dr. Clementeen Graham   HPI: Joseph [name] is a 59 y.o. 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 He was readmitted on 12/09/14 with fever, N/V/D, persistent cough with mild hemoptysis, tachycardia and hypotension due to sepsis from LLL PNA. He was bolused with I liter IV fluids and started on IV Vanc and Zosyn and chronic coumadin changed to IV heparin. Patient with lethargy and MRI brain repeated showing " persistent abnormal flare involving the mesial temporal lobes/hippocampi bilaterally, with question of trace diffusion abnormality within the right hippocampus". Stools checked for C diff and showed evidence of colonization. PCCM consulted for input and recommended holding coumadin and that hemoptysis should improve with treatment of PNA. To resume coumadin once hemoptysis resolves and INR < 3.0. He was IV amiodarone and cardizem for control of heart rate and fluid overload managed by HD. Mentation has improved but patient remains significantly deconditioned. PT/OT evaluations done yesterday and CIR recommended by MD and Rehab team.     Review of Systems  Unable to perform ROS: mental acuity  HENT: Negative for hearing loss.  Eyes: Positive for blurred vision (without glasses).  Respiratory: Negative for cough, shortness of breath and wheezing.  Cardiovascular: Negative for chest pain and palpitations.  Gastrointestinal: Negative for abdominal pain.  Musculoskeletal: Negative for myalgias.  Neurological: Negative for headaches.      Past Medical History  Diagnosis Date  . Atrial  fibrillation     a. 11/18/2011 s/p DCCV - 150J; b. Anticoagulation w/ Apixaban; c. 11/2011 back in afib->asymptomatic.  . H/O alcohol abuse     a. drinks heavily on the weekends.  . Hypertension   . Diabetes mellitus   . Heart murmur     a. 09/2011 Echo: EF 55-60%, Triv AI, Mild MR, mildly dil LA.  . Diabetic peripheral neuropathy   . CKD (chronic kidney disease), stage III   . CHF (congestive heart failure)   . Pneumonia   . Sleep apnea     Past Surgical History  Procedure Laterality Date  . Eye sx  11/11/2011    left eye  . Cardioversion  11/18/2011    Procedure: CARDIOVERSION; Surgeon: Josue Hector, MD; Location: [facility] ENDOSCOPY; Service: Cardiovascular; Laterality: N/A;  . Colonoscopy w/ biopsies and polypectomy    . Av fistula placement Left 02/13/2014    Procedure: INSERTION OF ARTERIOVENOUS (AV) GORE-TEX GRAFT ARM; Surgeon: Angelia Mould, MD; Location: [place]; Service: Vascular; Laterality: Left;  . Right heart catheterization N/A 10/11/2013    Procedure: RIGHT HEART CATH; Surgeon: Larey Dresser, MD; Location: [facility] CATH LAB; Service: Cardiovascular; Laterality: N/A;    Family History  Problem Relation Age of Onset  . Heart disease Mother     before age 13  . Diabetes Father   . Diabetes Sister   . Peripheral vascular disease Sister     amputation  . Cancer Neg Hx   . Stroke Neg Hx     Social History: Married. Independent prior to admission in July. reports that he quit smoking about 20 years ago. His smoking use included Cigarettes. He has a .05 pack-year smoking history. He has never used smokeless tobacco. He  reports that he does not drink alcohol or use illicit drugs.    Allergies  Allergen Reactions  . Keflex [Cephalexin] Other (See Comments)    c-diff reaction  . Dilaudid [Hydromorphone] Nausea And Vomiting     Medications Prior to Admission  Medication Sig Dispense Refill  . atorvastatin (LIPITOR) 40 MG tablet Take 40 mg by mouth at bedtime.     . calcitRIOL (ROCALTROL) 0.25 MCG capsule Take 7 capsules (1.75 mcg total) by mouth every Monday, Wednesday, and Friday with hemodialysis.    Marland Kitchen colchicine 0.6 MG tablet TAKE 1 TABLET (0.6 MG TOTAL) BY MOUTH 2 (TWO) TIMES DAILY. 60 tablet 2  . diltiazem (CARDIZEM CD) 120 MG 24 hr capsule Take 1 capsule (120 mg total) by mouth daily. 30 capsule 0  . ethyl chloride spray Apply 1 application topically every other day. Mon Wed Fri - done with dialysis  6  . fluocinonide ointment (LIDEX) 0.05 % Apply topically 4 (four) times daily. 30 g 0  . hydrocerin (EUCERIN) CREA Apply 1 application topically 4 (four) times daily.  0  . insulin aspart (NOVOLOG) 100 UNIT/ML injection Before each meal 3 times a day, 140-199 - 2 units, 200-250 - 4 units, 251-299 - 6 units, 300-349 - 8 units, 350 or above 10 units. 10 mL 11  . Insulin Glargine (LANTUS SOLOSTAR) 100 UNIT/ML Solostar Pen Inject 20 Units into the skin 2 (two) times daily. 15 mL 11  . levocetirizine (XYZAL) 5 MG tablet Take 5 mg by mouth every evening.    . metoprolol tartrate (LOPRESSOR) 50 MG tablet Take 1 tablet (50 mg total) by mouth 2 (two) times daily.    . Nutritional Supplements (FEEDING SUPPLEMENT, NEPRO CARB STEADY,) LIQD Take 237 mLs by mouth 2 (two) times daily between meals. 20 Can 0  . ondansetron (ZOFRAN) 4 MG tablet Take 4 mg by mouth 3 (three) times daily as needed for nausea or vomiting.    . pantoprazole (PROTONIX) 20 MG tablet Take 1 tablet (20 mg total) by mouth 2 (two) times daily.    . phenytoin (DILANTIN) 300 MG ER capsule Take 1 capsule (300 mg total) by mouth at bedtime. (Patient taking differently: Take 300 mg by mouth every morning. )    . saccharomyces boulardii (FLORASTOR) 250 MG capsule Take 1 capsule  (250 mg total) by mouth 2 (two) times daily. (Patient taking differently: Take 250 mg by mouth daily. ) 30 capsule 0  . sevelamer carbonate (RENVELA) 800 MG tablet Take 2 tablets (1,600 mg total) by mouth 3 (three) times daily with meals.    . warfarin (COUMADIN) 5 MG tablet Take 1 tablet (5 mg total) by mouth daily. 30 tablet 0  . enoxaparin (LOVENOX) 150 MG/ML injection Inject 0.55 mLs (85 mg total) into the skin daily. Stopped once INR reaches 2. 0 Syringe     Home: Home Living Family/patient expects to be discharged to:: Private residence Living Arrangements: Spouse/significant other Available Help at Discharge: Family Type of Home: House Home Access: Level entry Home Layout: One level Bathroom Shower/Tub: Chiropodist: Standard Home Equipment: [place] - single point, Environmental consultant - 2 wheels  Functional History: Prior Function Level of Independence: Needs assistance ADL's / Homemaking Assistance Needed: [facility] 11/30/14 d/c to [place] with (A) for adls Functional Status:  Mobility: Bed Mobility Overal bed mobility: Needs Assistance Bed Mobility: Sit to Supine Supine to sit: Min guard General bed mobility comments: used momentum to get to EOB Transfers Overall transfer level:  Needs assistance Equipment used: Rolling walker (2 wheeled) Transfers: Sit to/from Stand, Stand Pivot Transfers Sit to Stand: Max assist Stand pivot transfers: Max assist General transfer comment: requires use of hands; assisted to come forward and for lift      ADL: ADL Overall ADL's : Needs assistance/impaired Grooming: Wash/dry face, Wash/dry hands, Applying deodorant, Min guard, Sitting Grooming Details (indicate cue type and reason): requires rest breaks and cues for pursed lip breathing. HR reaching 118  Upper Body Bathing: Minimal assitance, Sitting Upper Body Bathing Details (indicate cue type and reason): requires rest breaks Lower Body Bathing:  Moderate assistance, Sit to/from stand Upper Body Dressing : Moderate assistance, Sitting Toilet Transfer: Maximal assistance, BSC (requires use of hands) General ADL Comments: Pt in chair on arrival and transfered in chair to sink level. Pt remained seated for bathing session for energy conservation and to monitor HR closely. HR reaches max 118 during session. pt sit<>Stand only for peri care. Pt has x3 lotions/ creams for back rash that wife provided.Pt has vasculine for buttock . wife and patient educated on pressure relief while in chair  Cognition: Cognition Overall Cognitive Status: Impaired/Different from baseline Orientation Level: Oriented to person, Oriented to place, Disoriented to time, Disoriented to situation Cognition Arousal/Alertness: Awake/alert Behavior During Therapy: WFL for tasks assessed/performed Overall Cognitive Status: Impaired/Different from baseline Area of Impairment: Orientation, Memory, Following commands, Safety/judgement, Awareness Orientation Level: Disoriented to, Place, Situation (I know I am in [place]) Current Attention Level: Selective Memory: Decreased recall of precautions, Decreased short-term memory Following Commands: Follows one step commands with increased time, Follows multi-step commands inconsistently Safety/Judgement: Decreased awareness of safety, Decreased awareness of deficits Awareness: Intellectual Problem Solving: Slow processing, Decreased initiation General Comments: "i am a little confused" - Pt able to state location as [facility] month as August 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 Pt however reports the year is 2015, unknown type of building or reason for admission.    Blood pressure 104/64, pulse 109, temperature 98.1 F (36.7 C), temperature source Oral, resp. rate 24, height 6' (1.829 m), weight 80.5 kg (177 lb 7.5 oz), SpO2 95 %. Physical Exam  Nursing note and vitals reviewed. Constitutional: He appears well-developed and  well-nourished.  In HD and complaining of chills.  HENT:  Head: Normocephalic and atraumatic.  Eyes: Conjunctivae are normal. Pupils are equal, round, and reactive to light.  Neck: Normal range of motion. Neck supple.  Cardiovascular: An irregular rhythm present. Tachycardia present.  Respiratory: Effort normal. He has decreased breath sounds. He has no wheezes. He exhibits no tenderness.  GI: Soft. Bowel sounds are normal. He exhibits no distension. There is no tenderness.  Musculoskeletal: He exhibits no edema or tenderness.  Neurological: He is alert.  Oriented to self only. Place - "HD center". He was able to state age and DOB but unable to recall events of past few weeks. Able to follow simple one and two step motor commands.  Skin: Skin is warm and dry.  Psychiatric: His affect is blunt. He is slowed. Cognition and memory are impaired. He exhibits abnormal recent memory and abnormal remote memory.  Motor strength is 4 minus/5 in the right deltoid, biceps, triceps, grip Left grip is 4/5, proximally not tested secondary to ongoing hemodialysis Lower extremity strength 4 minus hip flexor knee extensor ankle dorsiflexor plantar flexor Sensation diminished bilateral feet to light touch   Lab Results Last 24 Hours    Results for orders placed or performed during the hospital encounter of 12/09/14 (from the  past 24 hour(s))  Glucose, capillary Status: Abnormal   Collection Time: 12/11/14 7:25 AM  Result Value Ref Range   Glucose-Capillary 190 (H) 65 - 99 mg/dL   Comment 1 Notify RN    Comment 2 Document in Chart   Glucose, capillary Status: Abnormal   Collection Time: 12/11/14 1:21 PM  Result Value Ref Range   Glucose-Capillary 261 (H) 65 - 99 mg/dL  Glucose, capillary Status: Abnormal   Collection Time: 12/11/14 5:57 PM  Result Value Ref Range   Glucose-Capillary 234 (H) 65 - 99 mg/dL   Comment 1 Notify RN     Comment 2 Document in Chart   Glucose, capillary Status: Abnormal   Collection Time: 12/11/14 9:12 PM  Result Value Ref Range   Glucose-Capillary 250 (H) 65 - 99 mg/dL      Imaging Results (Last 48 hours)    No results found.    Assessment/Plan: Diagnosis: Deconditioning related to pneumonia in a patient with limbic encephalitis and severe cognitive deficits 1. Does the need for close, 24 hr/day medical supervision in concert with the patient's rehab needs make it unreasonable for this patient to be served in a less intensive setting? Potentially 2. Co-Morbidities requiring supervision/potential complications: A. Fib with rapid ventricular response, diabetes type 2 with neuropathy uncontrolled, end-stage renal disease on hemodialysis 3. Due to bowel management, safety, skin/wound care, disease management, medication administration, pain management and patient education, does the patient require 24 hr/day rehab nursing? Potentially 4. Does the patient require coordinated care of a physician, rehab nurse, PT (1-2 hrs/day, 55 days/week), OT (1-2 hrs/day, 5 days/week) and SLP (0.5-1 hrs/day, 5 days/week) to address physical and functional deficits in the context of the above medical diagnosis(es)? Yes Addressing deficits in the following areas: balance, endurance, locomotion, strength, transferring, bowel/bladder control, bathing, dressing, feeding, grooming, toileting, cognition and psychosocial support 5. Can the patient actively participate in an intensive therapy program of at least 3 hrs of therapy per day at least 5 days per week? Potentially 6. The potential for patient to make measurable gains while on inpatient rehab is good and fair 7. Anticipated functional outcomes upon discharge from inpatient rehab are supervision with PT, min assist with OT, min assist with SLP. 8. Estimated rehab length of stay to reach the above functional goals is: 14-21 days 9. Does the patient  have adequate social supports and living environment to accommodate these discharge functional goals? Yes 10. Anticipated D/C setting: Home 11. Anticipated post D/C treatments: [place] therapy 12. Overall Rehab/Functional Prognosis: fair  RECOMMENDATIONS: This patient's condition is appropriate for continued rehabilitative care in the following setting: CIR once medically stable Patient has agreed to participate in recommended program. N/A and patient is confused cannot give consent Note that insurance prior authorization may be required for reimbursement for recommended care.  Comment: Needs control of medical issues specifically A. Fib with rapid ventricular response prior to CIR    12/12/2014       Revision History     Date/Time User Provider Type Action   12/12/2014 10:30 AM Charlett Blake, MD Physician Sign   12/12/2014 9:48 AM Bary Leriche, PA-C Physician Assistant Share   View Details Report       Routing History     Date/Time From To Method   12/12/2014 10:30 AM Charlett Blake, MD Charlett Blake, MD In Basket   12/12/2014 10:30 AM Charlett Blake, MD Jearld Fenton, NP In [facility]

## 2014-12-26 NOTE — Progress Notes (Addendum)
I have insurance approval to admit pt to inpt rehab today. Wife and pt are aware. I will make the arrangements. I have updated dialysis of planned admit to CIR today as well as pt's request to receive dialysis in his recliner as he would in outpt center. I am contacting Dr. Jonnie Finner to clarify if R IJ cath can be removed since placed for plasma exchanges which are complete. [phone]

## 2014-12-26 NOTE — Progress Notes (Signed)
Subjective:   UPset that HD schedule was changed this am. No SOB or CP.   Objective Filed Vitals:   12/26/14 0052 12/26/14 0440 12/26/14 0622 12/26/14 1040  BP: 105/71 105/65 115/66   Pulse: 81 94 79   Temp:  98.6 F (37 C) 98.4 F (36.9 C)   TempSrc:  Oral Oral   Resp:  16    Height:      Weight:    79.9 kg (176 lb 2.4 oz)  SpO2:  100% 100%    Physical Exam General: sitting in chair. Alert. No acute distress.  Heart: RRR  Lungs: CTA, unlabored Abdomen: soft, nontender +BS  Extremities: 2= bilat LE edema Dialysis Access:  L AVG +b/t   R IJ Cath placed for plasma exchanges  Dialysis Orders: [facility] 4 hrs 80kgs 3K/2.5Ca No Heparin   L AVG  (R IJ cath for plasma exchange) Profile 2 Micera 100 q 2 weeks- to start 8/24 Calcitriol 1.75  Assessment: 1. Sepsis- off antibiotics. Needs repeat chest xray in 4 weeks. On po vanc for cdiff 2. Acute encephalopathy/ "limbic" encephalitis- improving. S/p plasma exchanges- last 9/3 2. ESRD - MWF [facility] 3. Anemia - aranesp q Monday- hgb 7.7- increase dose.  4. Secondary hyperparathyroidism - ca+ 8.6/phos 3.6. Cont calcitriol.  5. HTN/volume excess - LE edema, continue to lower dry wt 6. Nutrition - appetite improving. Alb 3.6.renal diet.  7 afib- on coumadin and amiodarone 8. Seizure- dilantin 9. DM  Plan - HD today, get vol down  Kelly Splinter MD (pgr) [phone]    ([phone] 12/26/2014, 12:02 PM    Additional Objective Labs: Basic Metabolic Panel:  Recent Labs Lab 12/24/14 1530 12/25/14 0712 12/26/14 0508  NA 136 137 134*  K 3.7 3.4* 3.5  CL 105 100* 97*  CO2 22 27 27   GLUCOSE 277* 121* 151*  BUN 34* 15 31*  CREATININE 4.88* 3.20* 4.56*  CALCIUM 8.3* 8.4* 8.5*  PHOS 3.7 3.2 3.8   Liver Function Tests:  Recent Labs Lab 12/24/14 1530 12/25/14 0712 12/26/14 0508  ALBUMIN 3.2* 3.5 3.2*   No results for input(s): LIPASE, AMYLASE in the last 168 hours. CBC:  Recent Labs Lab 12/22/14 0516  12/22/14 1745  12/23/14 0545 12/24/14 1530 12/25/14 0724  WBC 15.5* 14.3*  --  14.1* 15.9* 13.9*  NEUTROABS  --   --   --   --  11.7*  --   HGB 8.2* 7.8*  < > 7.7* 7.0* 7.6*  HCT 25.0* 23.9*  < > 23.9* 22.2* 24.4*  MCV 94.0 93.0  --  93.7 95.3 96.4  PLT 289 267  --  229 202 206  < > = values in this interval not displayed. Blood Culture    Component Value Date/Time   SDES BLOOD RIGHT HAND 12/18/2014 1215   SPECREQUEST BOTTLES DRAWN AEROBIC AND ANAEROBIC 5CC 12/18/2014 1215   CULT NO GROWTH 5 DAYS 12/18/2014 1215   REPTSTATUS 12/23/2014 FINAL 12/18/2014 1215    Cardiac Enzymes: No results for input(s): CKTOTAL, CKMB, CKMBINDEX, TROPONINI in the last 168 hours. CBG:  Recent Labs Lab 12/25/14 0851 12/25/14 1212 12/25/14 1654 12/25/14 2135 12/26/14 0806  GLUCAP 163* 109* 238* 259* 167*   Iron Studies: No results for input(s): IRON, TIBC, TRANSFERRIN, FERRITIN in the last 72 hours. @lablastinr3 @ Studies/Results: No results found. Medications: . citrate dextrose     . therapeutic plasma exchange solution   Dialysis Once in dialysis  . amiodarone  400 mg Oral Daily  .  atorvastatin  40 mg Oral QHS  . calcitRIOL  1.75 mcg Oral Q M,W,F-HD  . colchicine  0.6 mg Oral Daily  . darbepoetin (ARANESP) injection - DIALYSIS  150 mcg Intravenous Q Mon-HD  . diltiazem  30 mg Oral 4 times per day  . feeding supplement (NEPRO CARB STEADY)  237 mL Oral BID BM  . fluocinonide ointment   Topical QID  . guaiFENesin  600 mg Oral BID  . heparin  1,000 Units Intracatheter Once  . heparin  1,000 Units Intracatheter Once  . insulin aspart  0-15 Units Subcutaneous TID WC  . insulin aspart  0-5 Units Subcutaneous QHS  . insulin glargine  8 Units Subcutaneous QHS  . metoprolol tartrate  75 mg Oral BID  . multivitamin  1 tablet Oral QHS  . nystatin   Topical BID  . phenytoin  300 mg Oral q morning - 10a  . saccharomyces boulardii  250 mg Oral BID  . sodium chloride  10-40 mL Intracatheter  Q12H  . sodium chloride  3 mL Intravenous Q12H  . warfarin  6 mg Oral ONCE-1800  . Warfarin - Pharmacist Dosing Inpatient   Does not apply [phone]

## 2014-12-26 NOTE — Care Management Note (Signed)
Case Management Note  Patient Details  Name: Joseph [name] MRN: [id number] Date of Birth: 1955-10-02  Subjective/Objective:  Patient is for dc today to CIR.                    Action/Plan:   Expected Discharge Date:                  Expected Discharge Plan:  IP Rehab Facility  In-House Referral:  Clinical Social Work  Discharge planning Services  CM Consult  Post Acute Care Choice:    Choice offered to:     DME Arranged:    DME Agency:     HH Arranged:    [place] Agency:     Status of Service:  Completed, signed off  Medicare Important Message Given:  Yes-fourth notification given Date Medicare IM Given:    Medicare IM give by:    Date Additional Medicare IM Given:    Additional Medicare Important Message give by:     If discussed at [place] of Stay Meetings, dates discussed:    Additional Comments:  [name], RN 12/26/2014, 10:23 AM

## 2014-12-26 NOTE — H&P (View-Only) (Signed)
Physical Medicine and Rehabilitation Admission H&P    Chief Complaint  Patient presents with  . Code Sepsis  : HPI: Joseph [name] is a 60 y.o. 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 He was readmitted on 12/09/14 with fever, N/V/D, persistent cough with mild hemoptysis, tachycardia and hypotension due to sepsis from LLL PNA. He was bolused with I liter IV fluids and started on IV Vanc and Zosyn and chronic coumadin changed to IV heparin. Patient with lethargy and MRI brain repeated showing " persistent abnormal flare involving the mesial temporal lobes/hippocampi bilaterally, with question of trace diffusion abnormality within the right hippocampus felt to represent ongoing encephalitis".Patient did receive plasma exchange 5 days since completed her neurology services. Stools checked for C diff and showed evidence of colonization and remains on contact precautions. Acute on chronic anemia as well asBouts of hemoptysis since resolved and Coumadin had been held for a short time and since resumed. He was receiving IV amiodarone and cardizem for control of heart rate and fluid overload managed by HD and has since been transitioned to by mouth.  Mentation has improved suspect acute encephalopathy but patient remains significantly deconditioned. PT/OT evaluations done yesterday and CIR recommended by MD and Rehab team. Patient was admitted for a comprehensive rehabilitation program  ROS Review of Systems  Unable to perform ROS: mental acuity  HENT: Negative for hearing loss.  Eyes: Positive for blurred vision (without glasses).  Respiratory: Negative for cough, shortness of breath and wheezing.  Cardiovascular: Negative for chest pain and palpitations.  Gastrointestinal: Negative for  abdominal pain.  Musculoskeletal: Negative for myalgias.  Neurological: Negative for headaches Past Medical History  Diagnosis Date  . Atrial fibrillation     a. 11/18/2011 s/p DCCV - 150J;  b. Anticoagulation w/ Apixaban;  c. 11/2011 back in afib->asymptomatic.  . H/O alcohol abuse     a. drinks heavily on the weekends.  . Hypertension   . Diabetes mellitus   . Heart murmur     a. 09/2011 Echo: EF 55-60%, Triv AI, Mild MR, mildly dil LA.  . Diabetic peripheral neuropathy   . CKD (chronic kidney disease), stage III   . CHF (congestive heart failure)   . Pneumonia   . Sleep apnea    Past Surgical History  Procedure Laterality Date  . Eye sx  11/11/2011    left eye  . Cardioversion  11/18/2011    Procedure: CARDIOVERSION;  Surgeon: Josue Hector, MD;  Location[facility] ENDOSCOPY;  Service: Cardiovascular;  Laterality: N/A;  . Colonoscopy w/ biopsies and polypectomy    . Av fistula placement Left 02/13/2014    Procedure: INSERTION OF ARTERIOVENOUS (AV) GORE-TEX GRAFT ARM;  Surgeon: Angelia Mould, MD;  Location: [place];  Service: Vascular;  Laterality: Left;  . Right heart catheterization N/A 10/11/2013    Procedure: RIGHT HEART CATH;  Surgeon: Larey Dresser, MD;  Location: [facility] CATH LAB;  Service: Cardiovascular;  Laterality: N/A;   Family History  Problem Relation Age of Onset  . Heart disease Mother     before age 60  . Diabetes Father   . Diabetes Sister   . Peripheral vascular disease Sister     amputation  . Cancer Neg Hx   . Stroke Neg Hx    Social History:  reports that he quit smoking about 20 years ago. His smoking use included Cigarettes. He has a .05 pack-year smoking history. He has never used smokeless tobacco. He reports that he does not drink alcohol or use illicit drugs. Allergies:  Allergies  Allergen Reactions  . Keflex [Cephalexin] Other (See Comments)    c-diff reaction  . Dilaudid [Hydromorphone] Nausea And Vomiting   Medications Prior to Admission    Medication Sig Dispense Refill  . atorvastatin (LIPITOR) 40 MG tablet Take 40 mg by mouth at bedtime.     . calcitRIOL (ROCALTROL) 0.25 MCG capsule Take 7 capsules (1.75 mcg total) by mouth every Monday, Wednesday, and Friday with hemodialysis.    Marland Kitchen colchicine 0.6 MG tablet TAKE 1 TABLET (0.6 MG TOTAL) BY MOUTH 2 (TWO) TIMES DAILY. 60 tablet 2  . diltiazem (CARDIZEM CD) 120 MG 24 hr capsule Take 1 capsule (120 mg total) by mouth daily. 30 capsule 0  . ethyl chloride spray Apply 1 application topically every other day. Mon Wed Fri - done with dialysis  6  . fluocinonide ointment (LIDEX) 0.05 % Apply topically 4 (four) times daily. 30 g 0  . hydrocerin (EUCERIN) CREA Apply 1 application topically 4 (four) times daily.  0  . insulin aspart (NOVOLOG) 100 UNIT/ML injection Before each meal 3 times a day, 140-199 - 2 units, 200-250 - 4 units, 251-299 - 6 units,  300-349 - 8 units,  350 or above 10 units. 10 mL 11  . Insulin Glargine (LANTUS SOLOSTAR) 100 UNIT/ML Solostar Pen Inject 20 Units into the skin 2 (two) times daily. 15 mL 11  . levocetirizine (XYZAL) 5 MG tablet Take 5 mg by mouth every evening.    . metoprolol tartrate (LOPRESSOR) 50 MG tablet Take 1 tablet (50 mg total) by mouth 2 (two) times daily.    . Nutritional Supplements (FEEDING SUPPLEMENT, NEPRO CARB STEADY,) LIQD Take 237 mLs by mouth 2 (two) times daily between meals. 20 Can 0  . ondansetron (ZOFRAN) 4 MG tablet Take 4 mg by mouth 3 (three) times daily as needed for nausea or vomiting.    . pantoprazole (PROTONIX) 20 MG tablet Take 1 tablet (20 mg total) by mouth 2 (two) times daily.    . phenytoin (DILANTIN) 300 MG ER capsule Take 1 capsule (300 mg total) by mouth at bedtime. (Patient taking differently: Take 300 mg by mouth every morning. )    . saccharomyces boulardii (FLORASTOR) 250 MG capsule Take 1 capsule (250 mg total) by mouth 2 (two) times daily. (Patient taking differently: Take 250 mg by mouth daily. ) 30 capsule 0  .  sevelamer carbonate (RENVELA) 800 MG tablet Take 2 tablets (1,600 mg total) by mouth 3 (three) times daily with meals.    . warfarin (COUMADIN) 5 MG tablet Take 1 tablet (5 mg total) by mouth daily. 30 tablet 0  . enoxaparin (LOVENOX) 150 MG/ML injection Inject 0.55 mLs (85 mg total) into the skin daily. Stopped once INR reaches 2. 0 Syringe     Home: Home Living Family/patient expects to be discharged to:: Skilled nursing facility Living Arrangements: Spouse/significant other Available Help at Discharge: Family Type of Home: House Home Access: Level entry Home Layout: One level Bathroom Shower/Tub: Chiropodist: Standard Home Equipment: Kasandra Knudsen - single point, Environmental consultant - 2 wheels   Functional History: Prior Function Level of Independence: Needs assistance ADL's / Homemaking Assistance Needed: [facility] 11/30/14 d/c to [facility] with (A) for adls  Functional Status:  Mobility: Bed Mobility Overal bed mobility: Needs Assistance Bed Mobility: Sit to Supine Supine to sit: Min guard General bed mobility comments: Pt on BSC on arrival. Transfers Overall transfer level: Needs assistance Equipment used: Rolling walker (2 wheeled) Transfers: Sit to/from Stand Sit to Stand: Min assist Stand pivot transfers: Min assist General transfer comment: verbal cues for hand placement and safety Ambulation/Gait Ambulation/Gait assistance: Min assist Ambulation Distance (Feet): 25 Feet (x 2) Assistive device: Rolling walker (2 wheeled) Gait Pattern/deviations: Step-through pattern, Decreased stride length, Trunk flexed General Gait Details: verbal cues for safety and sequencing Gait velocity: decreased Gait velocity interpretation: Below normal speed for age/gender    ADL: ADL Overall ADL's : Needs assistance/impaired Grooming: Wash/dry face, Wash/dry hands, Applying deodorant, Min guard, Sitting Grooming Details (indicate cue type and reason): requires rest breaks and  cues for pursed lip breathing. HR reaching 118  Upper Body Bathing: Minimal assitance, Sitting Upper Body Bathing Details (indicate cue type and reason): requires rest breaks Lower Body Bathing: Min guard, Sit to/from stand (washed bottom and peri area) Lower Body Bathing Details (indicate cue type and reason): also applied powder to peri area Upper Body Dressing : Moderate assistance, Sitting Lower Body Dressing: Minimal assistance, Sit to/from stand Lower Body Dressing Details (indicate cue type and reason): assisted in threading underwear over LE. Toilet Transfer: Min guard, Ambulation, RW, BSC Functional mobility during ADLs: Min guard, Rolling walker General ADL Comments: Pt able to don socks and doff underwear. Assist to thread leg into underwear and OT also doffed sock towards end of session, but feel pt could have managed sock. Educated on LB dressing technique. Educated on energy conservation and deep breathing technique.   Cognition: Cognition Overall Cognitive Status: Impaired/Different from baseline Orientation Level: Oriented X4 Cognition Arousal/Alertness: Awake/alert Behavior During Therapy: WFL for tasks assessed/performed Overall Cognitive Status: Impaired/Different from baseline Area of Impairment: Orientation, Memory Orientation Level: Disoriented to, Time Current Attention Level: Selective Memory: Decreased short-term memory Following Commands: Follows one step commands consistently Safety/Judgement: Decreased awareness of safety Awareness: Intellectual Problem Solving: Slow processing, Decreased initiation General Comments: Patient unable to state year/month/day.  He states he is still working - has been on disability x2 years.  Physical Exam: Blood pressure 119/75, pulse 76, temperature 98.8 F (37.1 C), temperature source Oral, resp. rate 18, height 6' (1.829 m), weight 81.2 kg (179 lb 0.2 oz), SpO2 100 %. Physical Exam Nursing note and vitals  reviewed. Constitutional: He appears well-developed and well-nourished.   HENT: oral mucosa pink/moist Head: Normocephalic and atraumatic.  Eyes: Conjunctivae are normal. Pupils are equal, round, and reactive to light.  Neck: Normal range of motion. Neck supple.  Cardiovascular: An irregular rhythm present. Tachycardia present.  Respiratory: Effort normal. He has decreased breath sounds. He has no wheezes, no rales. He exhibits no tenderness. No dyspnea GI: Soft. Bowel sounds are normal. He exhibits no distension. There is no tenderness.  Musculoskeletal: He exhibits no edema or tenderness.  Neurological: He is alert.  Oriented to self, place, month. Able to follow simple one and two step motor commands. conversationally appropriate. Motor strength is 4 minus/5 in the right deltoid, biceps, triceps, grip Left grip is 4/5, proximally not tested secondary to ongoing hemodialysis Lower extremity strength 4 minus hip flexor knee extensor ankle dorsiflexor plantar flexor Sensation diminished bilateral feet to light touch/pain.   Results for orders placed or performed during the hospital encounter of 12/09/14 (from the past 48 hour(s))  Glucose, capillary     Status: Abnormal  Collection Time: 12/23/14  4:50 PM  Result Value Ref Range   Glucose-Capillary 240 (H) 65 - 99 mg/dL  Glucose, capillary     Status: Abnormal   Collection Time: 12/23/14  9:27 PM  Result Value Ref Range   Glucose-Capillary 218 (H) 65 - 99 mg/dL  Protime-INR     Status: Abnormal   Collection Time: 12/24/14  6:00 AM  Result Value Ref Range   Prothrombin Time 18.1 (H) 11.6 - 15.2 seconds   INR 1.49 0.00 - 1.49  Renal function panel     Status: Abnormal   Collection Time: 12/24/14  6:00 AM  Result Value Ref Range   Sodium 137 135 - 145 mmol/L   Potassium 3.7 3.5 - 5.1 mmol/L   Chloride 106 101 - 111 mmol/L   CO2 22 22 - 32 mmol/L   Glucose, Bld 92 65 - 99 mg/dL   BUN 28 (H) 6 - 20 mg/dL   Creatinine, Ser 4.54  (H) 0.61 - 1.24 mg/dL   Calcium 8.6 (L) 8.9 - 10.3 mg/dL   Phosphorus 3.6 2.5 - 4.6 mg/dL   Albumin 3.6 3.5 - 5.0 g/dL   GFR calc non Af Amer 13 (L) >60 mL/min   GFR calc Af Amer 15 (L) >60 mL/min    Comment: (NOTE) The eGFR has been calculated using the CKD EPI equation. This calculation has not been validated in all clinical situations. eGFR's persistently <60 mL/min signify possible Chronic Kidney Disease.    Anion gap 9 5 - 15  Glucose, capillary     Status: None   Collection Time: 12/24/14  8:08 AM  Result Value Ref Range   Glucose-Capillary 80 65 - 99 mg/dL  Glucose, capillary     Status: Abnormal   Collection Time: 12/24/14 12:15 PM  Result Value Ref Range   Glucose-Capillary 177 (H) 65 - 99 mg/dL  Renal function panel     Status: Abnormal   Collection Time: 12/24/14  3:30 PM  Result Value Ref Range   Sodium 136 135 - 145 mmol/L   Potassium 3.7 3.5 - 5.1 mmol/L   Chloride 105 101 - 111 mmol/L   CO2 22 22 - 32 mmol/L   Glucose, Bld 277 (H) 65 - 99 mg/dL   BUN 34 (H) 6 - 20 mg/dL   Creatinine, Ser 4.88 (H) 0.61 - 1.24 mg/dL   Calcium 8.3 (L) 8.9 - 10.3 mg/dL   Phosphorus 3.7 2.5 - 4.6 mg/dL   Albumin 3.2 (L) 3.5 - 5.0 g/dL   GFR calc non Af Amer 12 (L) >60 mL/min   GFR calc Af Amer 14 (L) >60 mL/min    Comment: (NOTE) The eGFR has been calculated using the CKD EPI equation. This calculation has not been validated in all clinical situations. eGFR's persistently <60 mL/min signify possible Chronic Kidney Disease.    Anion gap 9 5 - 15  CBC with Differential/Platelet     Status: Abnormal   Collection Time: 12/24/14  3:30 PM  Result Value Ref Range   WBC 15.9 (H) 4.0 - 10.5 K/uL   RBC 2.33 (L) 4.22 - 5.81 MIL/uL   Hemoglobin 7.0 (L) 13.0 - 17.0 g/dL   HCT 22.2 (L) 39.0 - 52.0 %   MCV 95.3 78.0 - 100.0 fL   MCH 30.0 26.0 - 34.0 pg   MCHC 31.5 30.0 - 36.0 g/dL   RDW 22.4 (H) 11.5 - 15.5 %   Platelets 202 150 - 400 K/uL   Neutrophils Relative %  74 43 - 77 %    Lymphocytes Relative 16 12 - 46 %   Monocytes Relative 8 3 - 12 %   Eosinophils Relative 1 0 - 5 %   Basophils Relative 1 0 - 1 %   Neutro Abs 11.7 (H) 1.7 - 7.7 K/uL   Lymphs Abs 2.5 0.7 - 4.0 K/uL   Monocytes Absolute 1.3 (H) 0.1 - 1.0 K/uL   Eosinophils Absolute 0.2 0.0 - 0.7 K/uL   Basophils Absolute 0.2 (H) 0.0 - 0.1 K/uL   RBC Morphology POLYCHROMASIA PRESENT    WBC Morphology SLIGHT TOXIC GRANULATION NOTED   Glucose, capillary     Status: None   Collection Time: 12/24/14  8:19 PM  Result Value Ref Range   Glucose-Capillary 98 65 - 99 mg/dL   Comment 1 Notify RN    Comment 2 Document in Chart   Glucose, capillary     Status: Abnormal   Collection Time: 12/24/14  9:20 PM  Result Value Ref Range   Glucose-Capillary 102 (H) 65 - 99 mg/dL   Comment 1 Notify RN    Comment 2 Document in Chart   Protime-INR     Status: Abnormal   Collection Time: 12/25/14  7:12 AM  Result Value Ref Range   Prothrombin Time 17.1 (H) 11.6 - 15.2 seconds   INR 1.39 0.00 - 1.49  Phenytoin level, total     Status: Abnormal   Collection Time: 12/25/14  7:12 AM  Result Value Ref Range   Phenytoin Lvl 9.1 (L) 10.0 - 20.0 ug/mL  Renal function panel     Status: Abnormal   Collection Time: 12/25/14  7:12 AM  Result Value Ref Range   Sodium 137 135 - 145 mmol/L   Potassium 3.4 (L) 3.5 - 5.1 mmol/L   Chloride 100 (L) 101 - 111 mmol/L   CO2 27 22 - 32 mmol/L   Glucose, Bld 121 (H) 65 - 99 mg/dL   BUN 15 6 - 20 mg/dL   Creatinine, Ser 3.20 (H) 0.61 - 1.24 mg/dL    Comment: REPEATED TO VERIFY   Calcium 8.4 (L) 8.9 - 10.3 mg/dL   Phosphorus 3.2 2.5 - 4.6 mg/dL   Albumin 3.5 3.5 - 5.0 g/dL   GFR calc non Af Amer 20 (L) >60 mL/min   GFR calc Af Amer 23 (L) >60 mL/min    Comment: (NOTE) The eGFR has been calculated using the CKD EPI equation. This calculation has not been validated in all clinical situations. eGFR's persistently <60 mL/min signify possible Chronic Kidney Disease.    Anion gap 10 5 -  15  CBC     Status: Abnormal   Collection Time: 12/25/14  7:24 AM  Result Value Ref Range   WBC 13.9 (H) 4.0 - 10.5 K/uL   RBC 2.53 (L) 4.22 - 5.81 MIL/uL   Hemoglobin 7.6 (L) 13.0 - 17.0 g/dL   HCT 24.4 (L) 39.0 - 52.0 %   MCV 96.4 78.0 - 100.0 fL   MCH 30.0 26.0 - 34.0 pg   MCHC 31.1 30.0 - 36.0 g/dL   RDW 22.2 (H) 11.5 - 15.5 %   Platelets 206 150 - 400 K/uL  Glucose, capillary     Status: Abnormal   Collection Time: 12/25/14  8:51 AM  Result Value Ref Range   Glucose-Capillary 163 (H) 65 - 99 mg/dL  Glucose, capillary     Status: Abnormal   Collection Time: 12/25/14 12:12 PM  Result Value Ref Range  Glucose-Capillary 109 (H) 65 - 99 mg/dL   No results found.     Medical Problem List and Plan: 1. Functional deficits secondary to debilitation related to pneumonia/sepsis/ multi-medical with limbic encephalitis/seizure disorder 2.  DVT Prophylaxis/Anticoagulation: Chronic Coumadin therapy. Monitor for any bleeding episodes 3. Pain Management: Tylenol as needed 4. End-stage renal disease. Continue hemodialysis as indicated 5. Neuropsych: This patient is capable of making decisions on his own behalf. 6. Skin/Wound Care: Routine skin checks 7. Fluids/Electrolytes/Nutrition: Routine I&O with follow-up chemistries 8. Hypertension/atrial fibrillation. Amiodarone 400 mg daily, Cardizem 30 mg 4 times a day, Lopressor 75 mg twice a day. Monitor with increased mobility 9. Seizure disorder. Dilantin 300 mg daily. Monitor for seizure activity 10. Acute on chronic anemia. Follow-up CBC   Post Admission Physician Evaluation: 1. Functional deficits secondary  to debilitation/encephalopathy. 2. Patient is admitted to receive collaborative, interdisciplinary care between the physiatrist, rehab nursing staff, and therapy team. 3. Patient's level of medical complexity and substantial therapy needs in context of that medical necessity cannot be provided at a lesser intensity of care such as a  SNF. 4. Patient has experienced substantial functional loss from his/her baseline which was documented above under the "Functional History" and "Functional Status" headings.  Judging by the patient's diagnosis, physical exam, and functional history, the patient has potential for functional progress which will result in measurable gains while on inpatient rehab.  These gains will be of substantial and practical use upon discharge  in facilitating mobility and self-care at the household level. 5. Physiatrist will provide 24 hour management of medical needs as well as oversight of the therapy plan/treatment and provide guidance as appropriate regarding the interaction of the two. 6. 24 hour rehab nursing will assist with bladder management, bowel management, safety, skin/wound care, disease management, medication administration, pain management and patient education  and help integrate therapy concepts, techniques,education, etc. 7. PT will assess and treat for/with: Lower extremity strength, range of motion, stamina, balance, functional mobility, safety, adaptive techniques and equipment, NMR, cognitive perceptual awareness, activity tolerance.   Goals are: mod I. 8. OT will assess and treat for/with: ADL's, functional mobility, safety, upper extremity strength, adaptive techniques and equipment, NMR, cognitive perceptual rx, activity tolerance.   Goals are: mod I. Therapy may proceed with showering this patient. 9. SLP will assess and treat for/with: n/a.  Goals are: n/a. 10. Case Management and Social Worker will assess and treat for psychological issues and discharge planning. 11. Team conference will be held weekly to assess progress toward goals and to determine barriers to discharge. 12. Patient will receive at least 3 hours of therapy per day at least 5 days per week. 13. ELOS: 7-10 days       14. Prognosis:  excellent     Meredith Staggers, MD, [facility] 12/26/2014   12/25/2014

## 2014-12-26 NOTE — Progress Notes (Signed)
ANTICOAGULATION CONSULT NOTE - Follow Up Consult  Pharmacy Consult for Coumadin Indication: atrial fibrillation   Labs:  Recent Labs  12/24/14 0600 12/24/14 1530 12/25/14 0712 12/25/14 0724 12/26/14 0508  HGB  --  7.0*  --  7.6*  --   HCT  --  22.2*  --  24.4*  --   PLT  --  202  --  206  --   LABPROT 18.1*  --  17.1*  --  19.6*  INR 1.49  --  1.39  --  1.66*  CREATININE 4.54* 4.88* 3.20*  --  4.56*    Assessment: 59 YOM on warfarin for hx Afib  Home dose = 5mg  daily, but admitted with elevated INR  INR 1.66 today  Goal of Therapy:  INR 2-3   Plan:  -Coumadin 6 mg po x 1 dose today -Continue current phenytoin dose -Daily INR, mon s/sx bleeding  If patient discharged today, could consider returning to 5 mg daily of warfarin with close follow-up due to multiple drug interactions (amiodarone and phenytoin). Would recommend follow-up INR Fri 9/9 or Mon 9/12  Levester Fresh, PharmD, BCPS Clinical Pharmacist Pager [phone] 12/26/2014 8:10 AM

## 2014-12-26 NOTE — Progress Notes (Signed)
Please see full discharge summary dictated by Dr. Marylu Lund on 12/25/2014.  Patient will be discharged to inpatient rehab today.  He is stable for discharge.  No changes made.   Time spent: 15 minutes  Wells Mabe D.O. [facility] Pager [phone]  If 7PM-7AM, please contact night-coverage [email or web address] Password [facility] 12/26/2014, 9:49 AM

## 2014-12-26 NOTE — Progress Notes (Signed)
Patient will be transferred today to Inpatient Rehab. RN called for report with no success.

## 2014-12-26 NOTE — PMR Pre-admission (Signed)
PMR Admission Coordinator Pre-Admission Assessment  Patient: Joseph [name] is an 59 y.o., male MRN: [id number] DOB: Jan 12, 1956 Height: 6' (182.9 cm) Weight: 81.2 kg (179 lb 0.2 oz)              Insurance Information HMO:   PPO:      PCP:      IPA:      80/20:      OTHER: Choice Plus plan PRIMARY: [employer]      Policy#: [id number]      Subscriber: wife CM Name: Eulah Citizen      Phone#: [phone]     Fax#: EMR access Pre-Cert#: [id number]      Employer: [employer]. Pt's insurance can not deny admission to AIR, But if at first review pt does not show tolerance or progress, Insurance MD will deny and request Lower level of Care ar SNF. Wife is aware. Benefits:  Phone #: [phone]     Name: 12/25/2014 Eff. Date: 04/20/13     Deduct: $450      Out of Pocket Max: $2000      Life Max: none CIR: $300 copay per admission then covers at 85%      SNF: $300 copay per admission then 85% no day limit Outpatient: $30 copay per visit     Co-Pay: no visit limit Home Health: 85%      Co-Pay: no visit limit DME: 85%     Co-Pay: 15% Providers: in network  SECONDARY: Medicare A      Policy#: [id number]      Subscriber: pt Wife works so [employer] is primary over [employer] Application Date:       Case Manager:  Disability Application Date:       Case Worker:   Emergency Facilities manager Information    Name Relation Home Work [place] [phone]  [phone]     Current Medical History  Patient Admitting Diagnosis: Debility related to PNA with history limbic encephalitis and severe cognitive deficits  History of Present Illness: Joseph [name] is a 59 y.o. 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   He was readmitted on 12/09/14 with fever, N/V/D,  persistent cough with mild hemoptysis, tachycardia and hypotension due to sepsis from LLL PNA. He was bolused with I liter IV fluids and started on IV Vanc and Zosyn and chronic coumadin changed to IV heparin. Patient with lethargy and MRI brain repeated showing " persistent abnormal flare involving the mesial temporal lobes/hippocampi bilaterally, with question of trace diffusion abnormality within the right hippocampus felt to represent ongoing encephalitis".Patient did receive plasma exchange 5 days since completed her neurology services. Stools checked for C diff and showed evidence of colonization and remains on contact precautions. Acute on chronic anemia as well asBouts of hemoptysis since resolved and Coumadin had been held for a short time and since resumed. He was receiving IV amiodarone and cardizem for control of heart rate and fluid overload managed by HD and has since been transitioned to by mouth.  Mentation has improved suspect acute encephalopathy but patient remains significantly deconditioned. PT/OT evaluations done yesterday and CIR recommended by MD and Rehab team. Patient was admitted for a comprehensive rehabilitation program.  Past Medical History  Past Medical History  Diagnosis Date  .  Atrial fibrillation     a. 11/18/2011 s/p DCCV - 150J;  b. Anticoagulation w/ Apixaban;  c. 11/2011 back in afib->asymptomatic.  . H/O alcohol abuse     a. drinks heavily on the weekends.  . Hypertension   . Diabetes mellitus   . Heart murmur     a. 09/2011 Echo: EF 55-60%, Triv AI, Mild MR, mildly dil LA.  . Diabetic peripheral neuropathy   . CKD (chronic kidney disease), stage III   . CHF (congestive heart failure)   . Pneumonia   . Sleep apnea     Family History  family history includes Diabetes in his father and sister; Heart disease in his mother; Peripheral vascular disease in his sister. There is no history of Cancer or Stroke.  Prior Rehab/Hospitalizations:  Has the patient  had major surgery during 100 days prior to admission? No  Current Medications   Current facility-administered medications:  .  0.9 %  sodium chloride infusion, 100 mL, Intravenous, PRN, Edrick Oh, MD .  0.9 %  sodium chloride infusion, 100 mL, Intravenous, PRN, Edrick Oh, MD .  acetaminophen (TYLENOL) tablet 650 mg, 650 mg, Oral, Q4H PRN, Amie Portland, MD .  albumin human 50 g in sodium chloride 0.9 %, , Dialysis, Once in dialysis, Amie Portland, MD .  alteplase (CATHFLO ACTIVASE) injection 2 mg, 2 mg, Intracatheter, Once PRN, Edrick Oh, MD .  amiodarone (PACERONE) tablet 400 mg, 400 mg, Oral, Daily, Mihai Croitoru, MD, 400 mg at 12/26/14 1011 .  atorvastatin (LIPITOR) tablet 40 mg, 40 mg, Oral, QHS, Rise Patience, MD, 40 mg at 12/25/14 2219 .  benzonatate (TESSALON) capsule 200 mg, 200 mg, Oral, TID PRN, Gardiner Barefoot, NP, 200 mg at 12/20/14 0106 .  calcitRIOL (ROCALTROL) capsule 1.75 mcg, 1.75 mcg, Oral, Q M,W,F-HD, Rise Patience, MD, 1.75 mcg at 12/24/14 1847 .  citrate dextrose (ACD-A anticoagulant) solution 500 mL, 500 mL, Intravenous, Continuous, Osvaldo A Camilo, MD .  colchicine tablet 0.6 mg, 0.6 mg, Oral, Daily, Kinnie Feil, MD, 0.6 mg at 12/26/14 1010 .  Darbepoetin Alfa (ARANESP) injection 150 mcg, 150 mcg, Intravenous, Q Mon-HD, [name], NP, 150 mcg at 12/24/14 1846 .  diltiazem (CARDIZEM) tablet 30 mg, 30 mg, Oral, 4 times per day, Brett Canales, PA-C, 30 mg at 12/26/14 1010 .  diphenhydrAMINE (BENADRYL) capsule 25 mg, 25 mg, Oral, Q6H PRN, Amie Portland, MD, 25 mg at 12/24/14 0559 .  feeding supplement (NEPRO CARB STEADY) liquid 237 mL, 237 mL, Oral, BID BM, Rise Patience, MD, 237 mL at 12/26/14 1007 .  fluocinonide ointment (LIDEX) 0.05 %, , Topical, QID, Rise Patience, MD .  guaiFENesin (MUCINEX) 12 hr tablet 600 mg, 600 mg, Oral, BID, Nishant Dhungel, MD, 600 mg at 12/26/14 1010 .  heparin injection 1,000 Units, 1,000  Units, Intracatheter, Once, Amie Portland, MD .  heparin injection 1,000 Units, 1,000 Units, Intracatheter, Once, Amie Portland, MD .  heparin injection 1,000 Units, 1,000 Units, Dialysis, PRN, Edrick Oh, MD .  insulin aspart (novoLOG) injection 0-15 Units, 0-15 Units, Subcutaneous, TID WC, Nishant Dhungel, MD, 1 Units at 12/26/14 0900 .  insulin aspart (novoLOG) injection 0-5 Units, 0-5 Units, Subcutaneous, QHS, Nishant Dhungel, MD, 3 Units at 12/25/14 2220 .  insulin glargine (LANTUS) injection 8 Units, 8 Units, Subcutaneous, QHS, Nishant Dhungel, MD, 8 Units at 12/25/14 2221 .  lidocaine (PF) (XYLOCAINE) 1 % injection 5 mL, 5 mL, Intradermal, PRN, Hassell Done  Justin Mend, MD .  lidocaine-prilocaine (EMLA) cream 1 application, 1 application, Topical, PRN, Edrick Oh, MD .  metoprolol tartrate (LOPRESSOR) tablet 75 mg, 75 mg, Oral, BID, Nishant Dhungel, MD, 75 mg at 12/26/14 1009 .  multivitamin (RENA-VIT) tablet 1 tablet, 1 tablet, Oral, QHS, [name], NP, 1 tablet at 12/25/14 2222 .  naphazoline-pheniramine (NAPHCON-A) 0.025-0.3 % ophthalmic solution 2 drop, 2 drop, Both Eyes, QID PRN, Donne Hazel, MD, 2 drop at 12/26/14 1013 .  nystatin (MYCOSTATIN/NYSTOP) topical powder, , Topical, BID, Donne Hazel, MD .  pentafluoroprop-tetrafluoroeth [facility]) aerosol 1 application, 1 application, Topical, PRN, Edrick Oh, MD .  phenytoin (DILANTIN) ER capsule 300 mg, 300 mg, Oral, q morning - 10a, Rise Patience, MD, 300 mg at 12/26/14 1007 .  saccharomyces boulardii (FLORASTOR) capsule 250 mg, 250 mg, Oral, BID, Rise Patience, MD, 250 mg at 12/26/14 1010 .  sodium chloride 0.9 % injection 10-40 mL, 10-40 mL, Intracatheter, PRN, Roney Jaffe, MD, 10 mL at 12/23/14 2214 .  sodium chloride 0.9 % injection 10-40 mL, 10-40 mL, Intracatheter, Q12H, Donne Hazel, MD, 20 mL at 12/24/14 2200 .  sodium chloride 0.9 % injection 10-40 mL, 10-40 mL, Intracatheter, PRN, Donne Hazel, MD, 10 mL  at 12/26/14 0508 .  sodium chloride 0.9 % injection 3 mL, 3 mL, Intravenous, Q12H, Rise Patience, MD, 3 mL at 12/26/14 1000 .  warfarin (COUMADIN) tablet 6 mg, 6 mg, Oral, ONCE-1800, Wynell Balloon, RPH .  Warfarin - Pharmacist Dosing Inpatient, , Does not apply, q1800, Nishant Dhungel, MD, 1 each at 12/25/14 1725  Patients Current Diet: Diet renal with fluid restriction Fluid restriction:: 1200 mL Fluid; Room service appropriate?: Yes; Fluid consistency:: Thin  Precautions / Restrictions Precautions Precautions: Fall Precaution Comments: oxygen needs, multiple lines/ leads Restrictions Weight Bearing Restrictions: No   Has the patient had 2 or more falls or a fall with injury in the past year?No  Prior Activity Level Community (5-7x/wk): gradual decline in function over last few months Pt independent and driving pta 7/82/95 when became deconditioned. Pt was the main cook for the couple, did his own laundry and managed his disability funds. Wife managed household funds. Pt managed his meds with a pill box system. Cognitive deficits new since most recent admission.  Home Assistive Devices / Equipment Home Assistive Devices/Equipment: CBG Meter, Dentures (specify type), Walker (specify type) Home Equipment: Cane - single point, Walker - 2 wheels  Prior Device Use: Indicate devices/aids used by the patient prior to current illness, exacerbation or injury? cane  Prior Functional Level Prior Function Level of Independence: Needs assistance Gait / Transfers Assistance Needed: Uses cane at times and recently requiring assist at times. ADL's / Homemaking Assistance Needed: recent dc from [facility] and wend to [facility] prior to readmission Comments: was not on O2 pta  Self Care: Did the patient need help bathing, dressing, using the toilet or eating?  Independent  Indoor Mobility: Did the patient need assistance with walking from room to room (with or without device)?  Independent  Stairs: Did the patient need assistance with internal or external stairs (with or without device)? Independent  Functional Cognition: Did the patient need help planning regular tasks such as shopping or remembering to take medications? Independent  Current Functional Level Cognition  Overall Cognitive Status: Impaired/Different from baseline Current Attention Level: Selective Orientation Level: Oriented X4 Following Commands: Follows one step commands consistently Safety/Judgement: Decreased awareness of safety General Comments: Patient unable to  state year/month/day.  He states he is still working - has been on disability x2 years.    Extremity Assessment (includes Sensation/Coordination)  Upper Extremity Assessment: Defer to OT evaluation  Lower Extremity Assessment: Generalized weakness, RLE deficits/detail, LLE deficits/detail RLE Deficits / Details: grossly 3+/5, quads 4-/5 RLE Sensation: history of peripheral neuropathy LLE Deficits / Details: grossly 3+/5 LLE Sensation: history of peripheral neuropathy LLE Coordination: decreased fine motor    ADLs  Overall ADL's : Needs assistance/impaired Grooming: Wash/dry face, Wash/dry hands, Applying deodorant, Min guard, Sitting Grooming Details (indicate cue type and reason): requires rest breaks and cues for pursed lip breathing. HR reaching 118  Upper Body Bathing: Minimal assitance, Sitting Upper Body Bathing Details (indicate cue type and reason): requires rest breaks Lower Body Bathing: Min guard, Sit to/from stand (washed bottom and peri area) Lower Body Bathing Details (indicate cue type and reason): also applied powder to peri area Upper Body Dressing : Moderate assistance, Sitting Lower Body Dressing: Minimal assistance, Sit to/from stand Lower Body Dressing Details (indicate cue type and reason): assisted in threading underwear over LE. Toilet Transfer: Min guard, Ambulation, RW, BSC Functional mobility  during ADLs: Min guard, Rolling walker General ADL Comments: Pt able to don socks and doff underwear. Assist to thread leg into underwear and OT also doffed sock towards end of session, but feel pt could have managed sock. Educated on LB dressing technique. Educated on energy conservation and deep breathing technique.     Mobility  Overal bed mobility: Needs Assistance Bed Mobility: Sit to Supine Supine to sit: Min guard General bed mobility comments: Pt on BSC on arrival.    Transfers  Overall transfer level: Needs assistance Equipment used: Rolling walker (2 wheeled) Transfers: Sit to/from Stand Sit to Stand: Min assist Stand pivot transfers: Min assist General transfer comment: verbal cues for hand placement and safety    Ambulation / Gait / Stairs / Wheelchair Mobility  Ambulation/Gait Ambulation/Gait assistance: Museum/gallery curator (Feet): 25 Feet (x 2) Assistive device: Rolling walker (2 wheeled) Gait Pattern/deviations: Step-through pattern, Decreased stride length, Trunk flexed General Gait Details: verbal cues for safety and sequencing Gait velocity: decreased Gait velocity interpretation: Below normal speed for age/gender    Posture / Balance Balance Overall balance assessment: Needs assistance Sitting-balance support: No upper extremity supported, Feet supported Sitting balance-Leahy Scale: Good Standing balance support: Bilateral upper extremity supported Standing balance-Leahy Scale: Poor Standing balance comment: Use of RW for balance    Special needs/care consideration  Dialysis [facility] [place] used [facility] transportation Oxygen 3 liters nasal cannula not used pta Skin skin rash. Use cotton sheets only Bowel mgmt: LBM 9/7 continent Bladder mgmt: continent Diabetic mgmt yes   Previous Home Environment Living Arrangements: Spouse/significant other  Lives With: Spouse Available Help at Discharge: Family, Available 24 hours/day, Other (Comment) (wife to hires  assist when she works and her Mother and siste) Type of Home: [place] Name: recent d/c to [facility] SNF after last admission Home Layout: One level Home Access: Level entry Bathroom Shower/Tub: Chiropodist: Standard Bathroom Accessibility: Yes How Accessible: Accessible via walker [place]: No  Discharge Living Setting Plans for Discharge Living Setting: Patient's home, Lives with (comment), Other (Comment) (wife) Type of Home at Discharge: House Discharge Home Layout: One level Discharge Home Access: Level entry Discharge Bathroom Shower/Tub: Tub/shower unit Discharge Bathroom Toilet: Standard Discharge Bathroom Accessibility: Yes How Accessible: Accessible via walker Does the patient have any problems obtaining your medications?:  No  Social/Family/Support Systems Patient Roles: Spouse, Other (Comment) (No children) Contact Information: Von[place]ille Lo, wife Anticipated Caregiver: wife, hired caregivers, Mother in Sports coach and sister in Sports coach Anticipated Ambulance person Information: see above Ability/Limitations of Caregiver: wife works days but will arrange 24/7 supervision for d/c Caregiver Availability: 24/7 Discharge Plan Discussed with Primary Caregiver: Yes Is Caregiver In Agreement with Plan?: Yes Does Caregiver/Family have Issues with Lodging/Transportation while Pt is in Rehab?: No   Goals/Additional Needs Patient/Family Goal for Rehab: supervision PT and supervision to min assist OT and supervision SLP Expected length of stay: [facility] 10-14 days Equipment Needs: ESRD since 02/2014 [facility] transportation Pt/Family Agrees to Admission and willing to participate: Yes Program Orientation Provided & Reviewed with Pt/Caregiver Including Roles  & Responsibilities: Yes   Decrease burden of Care through IP rehab admission: n/a  Possible need for SNF placement upon discharge:not anticipated unless pt  does not demonstrate tolerance with inpt rehab intensity, [facility] will ask then for a lower level of care/SNF.  Patient Condition: This patient's medical and functional status has changed since the consult dated: 12/11/2014 in which the Rehabilitation Physician determined and documented that the patient's condition is appropriate for intensive rehabilitative care in an inpatient rehabilitation facility. See "History of Present Illness" (above) for medical update. Functional changes are: min to mod assist. Patient's medical and functional status update has been discussed with the Rehabilitation physician and patient remains appropriate for inpatient rehabilitation. Will admit to inpatient rehab today.  Preadmission Screen Completed By:  Cleatrice Burke, 12/26/2014 10:21 AM ______________________________________________________________________   Discussed status with Dr. Naaman Plummer on 12/26/2014 at 1021 and received telephone approval for admission today.  Admission Coordinator:  Cleatrice Burke, time 5852 Date 12/26/2014.

## 2014-12-26 NOTE — Clinical Social Work Note (Signed)
Clinical Social Worker continuing to follow patient and family for support and discharge planning needs.  Per chart, patient has been approved for inpatient rehab and plans to admit today.  Inpatient rehab admissions coordinator has updated patient and family of discharge plans.    Clinical Social Worker will sign off for now as social work intervention is no longer needed. Please consult [place] again if new need arises.  Barbette Or, [place]

## 2014-12-26 NOTE — Progress Notes (Signed)
Report was given to [facility], RN in Inpatient Rehab.

## 2014-12-26 NOTE — Progress Notes (Signed)
Pt has bilateral weeping edema of lower extremities. Piece of skin tore off when removing sock. Skin tear 2x2 inches across top of L foot. Applied gauze and kerlex. Pt refusing to sleep in bed, prefers recliner. States he gets paranoid and "the bed will swallow me up." Educated patient about necessity to elevate LE, propped on 2 pillows. Will continue to monitor.

## 2014-12-26 NOTE — Progress Notes (Signed)
Patient was transferred to Inpatient Rehab, room [id number].

## 2014-12-26 NOTE — Interval H&P Note (Signed)
INFANT ZINK was admitted today to Inpatient Rehabilitation with the diagnosis of debility/encephalopathy.  The patient's history has been reviewed, patient examined, and there is no change in status.  Patient continues to be appropriate for intensive inpatient rehabilitation.  I have reviewed the patient's chart and labs.  Questions were answered to the patient's satisfaction. The PAPE has been reviewed and assessment remains appropriate.  Gracelyn Coventry T 12/26/2014, 3:25 PM

## 2014-12-27 ENCOUNTER — Inpatient Hospital Stay (HOSPITAL_COMMUNITY): Payer: 59 | Admitting: Physical Therapy

## 2014-12-27 ENCOUNTER — Inpatient Hospital Stay (HOSPITAL_COMMUNITY): Payer: 59

## 2014-12-27 ENCOUNTER — Inpatient Hospital Stay (HOSPITAL_COMMUNITY): Payer: 59 | Admitting: Occupational Therapy

## 2014-12-27 ENCOUNTER — Inpatient Hospital Stay (HOSPITAL_COMMUNITY): Payer: 59 | Admitting: Speech Pathology

## 2014-12-27 LAB — GLUCOSE, CAPILLARY
GLUCOSE-CAPILLARY: 199 mg/dL — AB (ref 65–99)
GLUCOSE-CAPILLARY: 286 mg/dL — AB (ref 65–99)
Glucose-Capillary: 114 mg/dL — ABNORMAL HIGH (ref 65–99)
Glucose-Capillary: 171 mg/dL — ABNORMAL HIGH (ref 65–99)

## 2014-12-27 LAB — PROTIME-INR
INR: 1.93 — ABNORMAL HIGH (ref 0.00–1.49)
Prothrombin Time: 22 s — ABNORMAL HIGH (ref 11.6–15.2)

## 2014-12-27 LAB — HEPATITIS B SURFACE ANTIGEN: Hepatitis B Surface Ag: NEGATIVE

## 2014-12-27 MED ORDER — CHLORHEXIDINE GLUCONATE 4 % EX LIQD
CUTANEOUS | Status: AC
  Filled 2014-12-27: qty 15

## 2014-12-27 MED ORDER — POLYVINYL ALCOHOL 1.4 % OP SOLN
2.0000 [drp] | OPHTHALMIC | Status: DC | PRN
  Administered 2015-01-01: 2 [drp] via OPHTHALMIC
  Filled 2014-12-27: qty 15

## 2014-12-27 MED ORDER — METOPROLOL TARTRATE 50 MG PO TABS
50.0000 mg | ORAL_TABLET | Freq: Two times a day (BID) | ORAL | Status: DC
  Administered 2014-12-27 – 2014-12-30 (×4): 50 mg via ORAL
  Filled 2014-12-27 (×6): qty 1

## 2014-12-27 MED ORDER — LIDOCAINE HCL 1 % IJ SOLN
INTRAMUSCULAR | Status: AC
  Filled 2014-12-27: qty 20

## 2014-12-27 MED ORDER — WARFARIN SODIUM 5 MG PO TABS
5.0000 mg | ORAL_TABLET | Freq: Once | ORAL | Status: AC
Start: 2014-12-27 — End: 2014-12-27
  Administered 2014-12-27: 5 mg via ORAL
  Filled 2014-12-27: qty 1

## 2014-12-27 NOTE — Evaluation (Signed)
Speech Language Pathology Assessment and Plan  Patient Details  Name: Joseph [name] MRN: [id number] Date of Birth: 1955-05-23  SLP Diagnosis: Cognitive Impairments  Rehab Potential: Excellent ELOS: 7-10 days     Today's Date: 12/27/2014 SLP Individual Time: 1300-1400 SLP Individual Time Calculation (min): 60 min   Problem List:  Patient Active Problem List   Diagnosis Date Noted  . Debilitated 12/26/2014  . Acute encephalopathy   . Autoimmune encephalomyelitis   . Debility   . Atrial fibrillation with RVR   . Encephalopathy acute   . Nausea vomiting and diarrhea 12/10/2014  . Sepsis 12/10/2014  . Pneumonia 12/10/2014  . Encephalitis   . Convulsion   . Recurrent Clostridium difficile diarrhea   . Limbic encephalitis   . Confusion   . Encephalopathy   . Hyperglycemia 11/18/2014  . Delirium 11/18/2014  . DKA, type 2 11/18/2014  . Hyperkalemia 11/18/2014  . Protein-calorie malnutrition, severe 11/09/2014  . Volume overload 11/06/2014  . ESRD on hemodialysis 11/06/2014  . Dyspnea 11/06/2014  . Abnormal LFTs 11/06/2014  . C. difficile diarrhea 11/06/2014  . Supratherapeutic INR   . HCAP (healthcare-associated pneumonia) 11/05/2014  . Rash 10/23/2014  . Gout 05/18/2014  . Erectile dysfunction 05/18/2014  . Pulmonary hypertension   . Diabetes mellitus, controlled   . Chronic diastolic CHF (congestive heart failure) 09/22/2013  . Hyperlipidemia 10/27/2011  . Obstructive sleep apnea 10/27/2011  . Essential hypertension 09/24/2011  . Atrial fibrillation-permanent    Past Medical History:  Past Medical History  Diagnosis Date  . Atrial fibrillation     a. 11/18/2011 s/p DCCV - 150J;  b. Anticoagulation w/ Apixaban;  c. 11/2011 back in afib->asymptomatic.  . H/O alcohol abuse     a. drinks heavily on the weekends.  . Hypertension   . Diabetes mellitus   . Heart murmur     a. 09/2011 Echo: EF 55-60%, Triv AI, Mild MR, mildly dil LA.  . Diabetic peripheral neuropathy   .  CKD (chronic kidney disease), stage III   . CHF (congestive heart failure)   . Pneumonia   . Sleep apnea    Past Surgical History:  Past Surgical History  Procedure Laterality Date  . Eye sx  11/11/2011    left eye  . Cardioversion  11/18/2011    Procedure: CARDIOVERSION;  Surgeon: Josue Hector, MD;  Location: [facility];  Service: Cardiovascular;  Laterality: N/A;  . Colonoscopy w/ biopsies and polypectomy    . Av fistula placement Left 02/13/2014    Procedure: INSERTION OF ARTERIOVENOUS (AV) GORE-TEX GRAFT ARM;  Surgeon: Angelia Mould, MD;  Location: [facility];  Service: Vascular;  Laterality: Left;  . Right heart catheterization N/A 10/11/2013    Procedure: RIGHT HEART CATH;  Surgeon: Larey Dresser, MD;  Location: [facility] CATH LAB;  Service: Cardiovascular;  Laterality: N/A;    Assessment / Plan / Recommendation Clinical Impression Patient is a 59 y.o. year old male with history of DM type2, A fib with chronic Coumadin, ESRD, alcohol abuse, recent hospitalization 7/31-8/14/16 for encephalopathy due to limbic encephalitis with seizures, C diff colitis and demand ischemia who was discharged to SNF for rehab. He was readmitted on 12/09/14 with fever, N/V/D, persistent cough with mild hemoptysis, tachycardia and hypotension due to sepsis from LLL PNA. He was bolused with I liter IV fluids and started on IV Vanc and Zosyn and chronic coumadin changed to IV heparin. Patient with lethargy and MRI brain repeated showing " persistent abnormal flare involving  the mesial temporal lobes/hippocampi bilaterally, with question of trace diffusion abnormality within the right hippocampus felt to represent ongoing encephalitis". Patient did receive plasma exchange 5 days since completed her neurology services. Stools checked for C diff and showed evidence of colonization and remains on contact precautions. Acute on chronic anemia as well as Bouts of hemoptysis since resolved and Coumadin had been held for a  short time and since resumed. He was receiving IV amiodarone and cardizem for control of heart rate and fluid overload managed by HD and has since been transitioned to by mouth.     Mentation has improved suspect acute encephalopathy but patient remains significantly deconditioned. Patient transferred to CIR on 12/26/2014. Patient demonstrates moderate cognitive impairments in the areas of problem solving, recall, organization and awareness which impacts his overall safety with functional and familiar tasks. Patient would benefit from skilled SLP intervention to maximize his cognitive function and functional independence prior to discharge home.   Skilled Therapeutic Interventions          Administered a cognitive-linguistic evaluation. Please see above for details. Also administered the MoCA and patient scored 16/22 points with deficits in recall and organization. Educated patient in regards to his current cognitive function and goals of skilled SLP intervention, he verbalized understanding.   SLP Assessment  Patient will need skilled [facility] during CIR admission    Recommendations  Oral Care Recommendations: Oral care BID Recommendations for Other Services: Neuropsych consult Patient destination: Home Follow up Recommendations: Home Health SLP;24 hour supervision/assistance Equipment Recommended: None recommended by SLP    SLP Frequency 3 to 5 out of 7 days   SLP Treatment/Interventions Cognitive remediation/compensation;Cueing hierarchy;Environmental controls;Internal/external aids;Functional tasks;Patient/family education;Therapeutic Activities   Pain Pain Assessment Pain Assessment: No/denies pain Prior Functioning Type of Home: House  Lives With: Spouse Available Help at Discharge: Family;Available 24 hours/day;Other (Comment) (Wife currently works but he states she will be assisting him  and then will hire someone.) Vocation: On  disability  Function:   Cognition Comprehension Comprehension assist level: Follows basic conversation/direction with extra time/assistive device  Expression   Expression assist level: Expresses basic needs/ideas: With extra time/assistive device  Social Interaction Social Interaction assist level: Interacts appropriately with others with medication or extra time (anti-anxiety, antidepressant).  Problem Solving Problem solving assist level: Solves basic 75 - 89% of the time/requires cueing 10 - 24% of the time  Memory Memory assist level: Recognizes or recalls 25 - 49% of the time/requires cueing 50 - 75% of the time   Short Term Goals: Week 1: SLP Short Term Goal 1 (Week 1): Patient will demonstrate functional problem solving for mildly complex but familair tasks with Min A multimodal cues.  SLP Short Term Goal 2 (Week 1): Patient will utilize memory compensatory strategies to recall new, daily information with Mod A multimodal cues.  SLP Short Term Goal 3 (Week 1): Patient will self-monitor and correct errors during functional tasks with Min A multimodal cues.   Refer to Care Plan for Long Term Goals  Recommendations for other services: Neuropsych  Discharge Criteria: Patient will be discharged from SLP if patient refuses treatment 3 consecutive times without medical reason, if treatment goals not met, if there is a change in medical status, if patient makes no progress towards goals or if patient is discharged from hospital.  The above assessment, treatment plan, treatment alternatives and goals were discussed and mutually agreed upon: by patient  Kelin Borum 12/27/2014, 4:59 PM

## 2014-12-27 NOTE — Progress Notes (Signed)
ANTICOAGULATION CONSULT NOTE - Follow Up Consult  Pharmacy Consult for Coumadin Indication: atrial fibrillation  Labs:  Recent Labs  12/24/14 1530 12/25/14 0712 12/25/14 0724 12/26/14 0508 12/26/14 1556 12/27/14 0843  HGB 7.0*  --  7.6*  --  7.5*  --   HCT 22.2*  --  24.4*  --  23.5*  --   PLT 202  --  206  --  207  --   LABPROT  --  17.1*  --  19.6*  --  22.0*  INR  --  1.39  --  1.66*  --  1.93*  CREATININE 4.88* 3.20*  --  4.56* 5.09*  --     Assessment: 59 YOM continues on warfarin for hx afib. INR is slightly subtherapeutic but trending up steadily. No bleeding noted.   Home dose = 5mg  daily, but admitted with elevated INR  Goal of Therapy:  INR 2-3   Plan:  -Coumadin 5 mg po x 1 dose tonight -Daily INR, monitor s/sx bleeding  Salome Arnt, PharmD, BCPS Pager # [phone] 12/27/2014 10:47 AM

## 2014-12-27 NOTE — Evaluation (Signed)
Physical Therapy Assessment and Plan  Patient Details  Name: Joseph [name] MRN: [id number] Date of Birth: [birth year]  PT Diagnosis: Abnormal posture, Abnormality of gait, Cognitive deficits, Coordination disorder and Muscle weakness Rehab Potential: Good ELOS: 10-12 days   Today's Date: 12/27/2014 PT Individual Time: 1000-1130 PT Individual Time Calculation (min): 90 min    Problem List:  Patient Active Problem List   Diagnosis Date Noted  . Debilitated 12/26/2014  . Acute encephalopathy   . Autoimmune encephalomyelitis   . Debility   . Atrial fibrillation with RVR   . Encephalopathy acute   . Nausea vomiting and diarrhea 12/10/2014  . Sepsis 12/10/2014  . Pneumonia 12/10/2014  . Encephalitis   . Convulsion   . Recurrent Clostridium difficile diarrhea   . Limbic encephalitis   . Confusion   . Encephalopathy   . Hyperglycemia 11/18/2014  . Delirium 11/18/2014  . DKA, type 2 11/18/2014  . Hyperkalemia 11/18/2014  . Protein-calorie malnutrition, severe 11/09/2014  . Volume overload 11/06/2014  . ESRD on hemodialysis 11/06/2014  . Dyspnea 11/06/2014  . Abnormal LFTs 11/06/2014  . C. difficile diarrhea 11/06/2014  . Supratherapeutic INR   . HCAP (healthcare-associated pneumonia) 11/05/2014  . Rash 10/23/2014  . Gout 05/18/2014  . Erectile dysfunction 05/18/2014  . Pulmonary hypertension   . Diabetes mellitus, controlled   . Chronic diastolic CHF (congestive heart failure) 09/22/2013  . Hyperlipidemia 10/27/2011  . Obstructive sleep apnea 10/27/2011  . Essential hypertension 09/24/2011  . Atrial fibrillation-permanent     Past Medical History:  Past Medical History  Diagnosis Date  . Atrial fibrillation     a. 11/18/2011 s/p DCCV - 150J;  b. Anticoagulation w/ Apixaban;  c. 11/2011 back in afib->asymptomatic.  . H/O alcohol abuse     a. drinks heavily on the weekends.  . Hypertension   . Diabetes mellitus   . Heart murmur     a. 09/2011 Echo: EF 55-60%, Triv AI,  Mild MR, mildly dil LA.  . Diabetic peripheral neuropathy   . CKD (chronic kidney disease), stage III   . CHF (congestive heart failure)   . Pneumonia   . Sleep apnea    Past Surgical History:  Past Surgical History  Procedure Laterality Date  . Eye sx  11/11/2011    left eye  . Cardioversion  11/18/2011    Procedure: CARDIOVERSION;  Surgeon: Josue Hector, MD;  Location: [facility] ENDOSCOPY;  Service: Cardiovascular;  Laterality: N/A;  . Colonoscopy w/ biopsies and polypectomy    . Av fistula placement Left 02/13/2014    Procedure: INSERTION OF ARTERIOVENOUS (AV) GORE-TEX GRAFT ARM;  Surgeon: Angelia Mould, MD;  Location: [facility];  Service: Vascular;  Laterality: Left;  . Right heart catheterization N/A 10/11/2013    Procedure: RIGHT HEART CATH;  Surgeon: Larey Dresser, MD;  Location: [facility] CATH LAB;  Service: Cardiovascular;  Laterality: N/A;    Assessment & Plan Clinical Impression: Joseph [name] is a 59 y.o. 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   He was readmitted on 12/09/14 with fever, N/V/D, persistent cough with mild hemoptysis, tachycardia and hypotension due to sepsis from LLL PNA. He was bolused with I liter IV fluids and started on IV Vanc and Zosyn and chronic coumadin changed to IV heparin. Patient with lethargy and MRI  brain repeated showing " persistent abnormal flare involving the mesial temporal lobes/hippocampi bilaterally, with question of trace diffusion abnormality within the right hippocampus felt to represent ongoing encephalitis". Patient did receive plasma exchange 5 days since completed her neurology services. Stools checked for C diff and showed evidence of colonization and remains on contact precautions. Acute on chronic anemia as well as Bouts of hemoptysis  since resolved and Coumadin had been held for a short time and since resumed. He was receiving IV amiodarone and cardizem for control of heart rate and fluid overload managed by HD and has since been transitioned to by mouth. Mentation has improved suspect acute encephalopathy but patient remains significantly deconditioned. PT/OT evaluations done yesterday and CIR recommended by MD and Rehab team. Patient was admitted for a comprehensive rehabilitation program.  Patient transferred to CIR on 12/26/2014 .   Patient currently requires min with mobility secondary to muscle weakness, decreased cardiorespiratoy endurance and decreased standing balance, decreased postural control and decreased balance strategies.  Prior to hospitalization, patient was independent  with mobility and lived with Spouse in a House home.  Home access is 4Stairs to enter.  Patient will benefit from skilled PT intervention to maximize safe functional mobility and minimize fall risk for planned discharge home with 24 hour supervision.  Anticipate patient will benefit from HHPT versus OP PT, pending progress with endurance at discharge.  PT - End of Session Activity Tolerance: Tolerates 30+ min activity with multiple rests Endurance Deficit: Yes PT Assessment Rehab Potential (ACUTE/IP ONLY): Good Barriers to Discharge: Decreased caregiver support PT Patient demonstrates impairments in the following area(s): Balance;Endurance;Motor;Pain;Safety PT Transfers Functional Problem(s): Bed Mobility;Bed to Chair;Car;Furniture PT Locomotion Functional Problem(s): Ambulation;Wheelchair Mobility;Stairs PT Plan PT Intensity: Minimum of 1-2 x/day ,45 to 90 minutes PT Frequency: 5 out of 7 days PT Duration Estimated Length of Stay: 10-12 days PT Treatment/Interventions: Ambulation/gait training;Balance/vestibular training;Discharge planning;Functional mobility training;DME/adaptive equipment instruction;Neuromuscular re-education;Patient/family  education;Stair training;Therapeutic Activities;Therapeutic Exercise;UE/LE Coordination activities;UE/LE Strength taining/ROM;Wheelchair propulsion/positioning PT Transfers Anticipated Outcome(s): supervision PT Locomotion Anticipated Outcome(s): supervision PT Recommendation Follow Up Recommendations: Home health PT Patient destination: Home Equipment Recommended: To be determined  Skilled Therapeutic Intervention Pt received sitting in recliner and agreeable to therapy evaluation.  Pt performs stand pivot transfer from recliner to w/c with mod A to rise and verbal cues for controlled descent. PT instructed patient in management of w/c brakes and pt demonstrates understanding and pt propels w/c to therapy gym with supervision and occasional min A for navigation through doorways.  Pt demonstrates sit<>stand from w/c and from therapy mat with RW with mod A to facilitate forward weight shift and to rise.  PT instructs patient in ambulation with RW x60' with steady A and verbal cues for step length, walker position, and upright posture.  PT instructs patient in negotiation of corner stairs with steady A and verbal cues for safe sequencing and hand placement.  PT instructs patient in car transfer with mod A for sit<>stand from w/c and min A for sit<>stand from elevated car seat.  Pt returned to room in w/c total A for time management and positioned in recliner to comfort with call bell in reach and needs met.  PT provided patient with basic w/c seat cushion for positioning and comfort.    PT Evaluation Precautions/Restrictions Precautions Precautions: Fall Restrictions Weight Bearing Restrictions: No General Chart Reviewed: Yes Vital SignsTherapy Vitals Pulse Rate: 80 SPO2%: 98-100% on 2L O2 via [place] Pain Pain Assessment Pain Assessment: No/denies pain Home Living/Prior Functioning Home  Living Available Help at Discharge: Family;Available 24 hours/day;Other (Comment) (pt states he and his wife  will hire help to ensure 24/7 supervision) Type of Home: House Home Access: Stairs to enter [employer] of Steps: 4 Entrance Stairs-Rails: None (wall on the L that he can balance with) Home Layout: One level  Lives With: Spouse Prior Function Level of Independence: Independent with basic ADLs;Independent with homemaking with ambulation;Independent with gait;Independent with transfers  Able to Take Stairs?: Yes Driving: Yes Vocation: On disability Vision/Perception  Vision - Assessment Additional Comments: pt states his vision is not the same but unable to clarify if it's blurred, or otherwise  Cognition Overall Cognitive Status: Impaired/Different from baseline Orientation Level: Oriented to person;Oriented to place;Disoriented to situation;Disoriented to time Sensation Sensation Light Touch: Appears Intact Coordination Gross Motor Movements are Fluid and Coordinated: Yes Fine Motor Movements are Fluid and Coordinated: No (finger opposition normal on R, slow on L but accuracy intact) Finger Nose Finger Test: normal on R, dysmetria noted on L Heel Shin Test: accuracy intact bilaterally, decreased range and speed   Mobility Transfers Transfers: Yes Sit to Stand: 3: Mod assist Sit to Stand Details: Verbal cues for safe use of DME/AE;Tactile cues for posture;Manual facilitation for weight shifting Stand to Sit: 3: Mod assist Stand to Sit Details (indicate cue type and reason): Tactile cues for posture;Verbal cues for safe use of DME/AE;Manual facilitation for weight shifting Stand Pivot Transfers: 3: Mod assist Stand Pivot Transfer Details: Verbal cues for safe use of DME/AE;Tactile cues for posture;Manual facilitation for placement Locomotion  Ambulation Ambulation: Yes Ambulation/Gait Assistance: 4: Min guard Ambulation Distance (Feet): 60 Feet Assistive device: Rolling walker Gait Gait: Yes Gait Pattern: Impaired Gait Pattern: Step-to pattern;Decreased stride  length;Trunk flexed;Poor foot clearance - left;Poor foot clearance - right;Narrow base of support Gait velocity: decreased Stairs / Additional Locomotion Stairs: Yes Stairs Assistance: 4: Min guard Stairs Assistance Details: Verbal cues for safe use of DME/AE;Verbal cues for gait pattern;Verbal cues for precautions/safety;Verbal cues for technique;Verbal cues for sequencing;Tactile cues for sequencing Stair Management Technique: Two rails Number of Stairs: 8 Wheelchair Mobility Wheelchair Mobility: Yes Wheelchair Assistance: 4: Min Tour manager: Both upper extremities Wheelchair Parts Management: Supervision/cueing Distance: 150  Trunk/Postural Assessment  Cervical Assessment Cervical Assessment: Exceptions to [facility] (forward head) Thoracic Assessment Thoracic Assessment: Exceptions to [facility] (rounded shoulders) Lumbar Assessment Lumbar Assessment: Exceptions to [facility] (posterior pelvic tilt, slouched posture)  Balance Balance Balance Assessed: Yes Dynamic Sitting Balance Dynamic Sitting - Balance Support: Feet supported Dynamic Sitting - Level of Assistance: 5: Stand by assistance Dynamic Sitting - Balance Activities: Lateral lean/weight shifting;Forward lean/weight shifting;Reaching across midline Static Standing Balance Static Standing - Balance Support: Left upper extremity supported;Right upper extremity supported Static Standing - Level of Assistance: 5: Stand by assistance Dynamic Standing Balance Dynamic Standing - Balance Support: Left upper extremity supported;Right upper extremity supported Dynamic Standing - Level of Assistance: 4: Min assist Dynamic Standing - Balance Activities: Forward lean/weight shifting;Lateral lean/weight shifting;Reaching across midline Extremity Assessment  RLE Assessment RLE Assessment: Exceptions to [facility] RLE Strength RLE Overall Strength Comments: hip flexion 3+/5, knee extension 4/5, knee flexion 3+/5, DF 3/5 LLE Assessment LLE  Assessment: Exceptions to [facility] LLE Strength LLE Overall Strength Comments: hip flexion 3/5, knee extension 4/5, knee flexion 3+/5, DF 3/5   See Function Navigator for Current Functional Status.   Refer to Care Plan for Long Term Goals  Recommendations for other services: None  Discharge Criteria: Patient will be discharged from PT if patient  refuses treatment 3 consecutive times without medical reason, if treatment goals not met, if there is a change in medical status, if patient makes no progress towards goals or if patient is discharged from hospital.  The above assessment, treatment plan, treatment alternatives and goals were discussed and mutually agreed upon: by patient  Urban Gibson E Penven-Crew 12/27/2014, 12:10 PM

## 2014-12-27 NOTE — Progress Notes (Signed)
Patient information reviewed and entered into eRehab system by Neftaly Swiss, RN, CRRN, PPS Coordinator.  Information including medical coding and functional independence measure will be reviewed and updated through discharge.     Per nursing patient was given "Data Collection Information Summary for Patients in Inpatient Rehabilitation Facilities with attached "Privacy Act Statement-Health Care Records" upon admission.  

## 2014-12-27 NOTE — Progress Notes (Signed)
[facility] PHYSICAL MEDICINE & REHABILITATION     PROGRESS NOTE    Subjective/Complaints: Stayed in recliner last night. Doesn't like beds---very uncomfortable for back--often sleeps in recliner at home. Nurse concerned regarding edema/left foot skin tear. Restless last night  ROS: Pt denies fever, rash/itching, headache, blurred or double vision, nausea, vomiting, abdominal pain, diarrhea, chest pain, shortness of breath, palpitations, dysuria, dizziness,  , bleeding, or depression   Objective: Vital Signs: Blood pressure 98/60, pulse 76, temperature 98.9 F (37.2 C), temperature source Oral, resp. rate 20, weight 75.7 kg (166 lb 14.2 oz), SpO2 100 %. No results found.  Recent Labs  12/25/14 0724 12/26/14 1556  WBC 13.9* 14.7*  HGB 7.6* 7.5*  HCT 24.4* 23.5*  PLT 206 207    Recent Labs  12/26/14 0508 12/26/14 1556  NA 134* 131*  K 3.5 3.2*  CL 97* 94*  GLUCOSE 151* 230*  BUN 31* 36*  CREATININE 4.56* 5.09*  CALCIUM 8.5* 8.1*   CBG (last 3)   Recent Labs  12/26/14 1139 12/26/14 1957 12/27/14 0632  GLUCAP 205* 127* 114*    Wt Readings from Last 3 Encounters:  12/27/14 75.7 kg (166 lb 14.2 oz)  12/26/14 79.9 kg (176 lb 2.4 oz)  12/02/14 82.237 kg (181 lb 4.8 oz)    Physical Exam:  Constitutional: He appears well-developed and well-nourished.  HENT: oral mucosa pink/moist Head: Normocephalic and atraumatic.  Eyes: Conjunctivae are normal. Pupils are equal, round, and reactive to light.  Neck: Normal range of motion. Neck supple.  Cardiovascular: An irregular rhythm present. Tachycardia present.  Respiratory: Effort normal. He has decreased breath sounds. He has no wheezes, no rales. He exhibits no tenderness. No dyspnea GI: Soft. Bowel sounds are normal. He exhibits no distension. There is no tenderness.  Musculoskeletal: He exhibits no edema or tenderness.  Neurological: He is alert.  Oriented to self, place, month. Able to follow simple one and  two step motor commands. conversationally appropriate. Motor strength is 4 minus/5 in the right deltoid, biceps, triceps, grip Left grip is 4/5, proximally not tested secondary to ongoing hemodialysis Lower extremity strength 4 minus hip flexor knee extensor ankle dorsiflexor plantar flexor Sensation diminished bilateral feet to light touch/pain.  Skin: tear with weeping on dorsum of left foot. kerlix wraps on either leg Ext: 2+ edema on either leg/foot  Assessment/Plan: 1. Functional deficits secondary to debility/encephalopathy which require 3+ hours per day of interdisciplinary therapy in a comprehensive inpatient rehab setting. Physiatrist is providing close team supervision and 24 hour management of active medical problems listed below. Physiatrist and rehab team continue to assess barriers to discharge/monitor patient progress toward functional and medical goals.  Function:  Bathing Bathing position      Bathing parts      Bathing assist        Upper Body Dressing/Undressing Upper body dressing                    Upper body assist        Lower Body Dressing/Undressing Lower body dressing                                  Lower body assist        Toileting Toileting          Toileting assist     Transfers Chair/bed transfer  Psychologist, sport and exercise Comprehension    Expression    Social Interaction    Problem Solving    Memory     Medical Problem List and Plan: 1. Functional deficits secondary to debilitation related to pneumonia/sepsis/ multi-medical with limbic encephalitis/seizure disorder 2. DVT Prophylaxis/Anticoagulation: Chronic Coumadin therapy. Serial inr's 3. Pain Management: Tylenol as needed 4. End-stage renal disease. Continue hemodialysis as indicated 5. Neuropsych: This patient is capable of making decisions on his own behalf. 6. Skin/Wound Care: Routine  skin checks. Elevate and protect bilateral legs/feet  -local care to skin tears  -volume/edema mgt per renal 7. Fluids/Electrolytes/Nutrition: Routine I&O with follow-up chemistries 8. Hypertension/atrial fibrillation. Amiodarone 400 mg daily, Cardizem 30 mg 4 times a day, Lopressor 75 mg twice a day. Monitor with increased mobility 9. Seizure disorder. Dilantin 300 mg daily. Monitor for seizure activity 10. Acute on chronic anemia. Follow-up CBC 11. Diabetes: follow cbg's for pattern. ssi for now  LOS (Days) 1 A FACE TO FACE EVALUATION WAS PERFORMED  Alejandra Barna T 12/27/2014 7:20 AM

## 2014-12-27 NOTE — Evaluation (Signed)
Occupational Therapy Assessment and Plan  Patient Details  Name: Joseph [name] MRN: [id number] Date of Birth: 05/12/1955  OT Diagnosis: abnormal posture and muscle weakness (generalized) Rehab Potential: Rehab Potential (ACUTE ONLY): Fair ELOS: 7-10 days   Today's Date: 12/27/2014 OT Individual Time: 0277-4128 OT Individual Time Calculation (min): 69 min     Problem List:  Patient Active Problem List   Diagnosis Date Noted  . Debilitated 12/26/2014  . Acute encephalopathy   . Autoimmune encephalomyelitis   . Debility   . Atrial fibrillation with RVR   . Encephalopathy acute   . Nausea vomiting and diarrhea 12/10/2014  . Sepsis 12/10/2014  . Pneumonia 12/10/2014  . Encephalitis   . Convulsion   . Recurrent Clostridium difficile diarrhea   . Limbic encephalitis   . Confusion   . Encephalopathy   . Hyperglycemia 11/18/2014  . Delirium 11/18/2014  . DKA, type 2 11/18/2014  . Hyperkalemia 11/18/2014  . Protein-calorie malnutrition, severe 11/09/2014  . Volume overload 11/06/2014  . ESRD on hemodialysis 11/06/2014  . Dyspnea 11/06/2014  . Abnormal LFTs 11/06/2014  . C. difficile diarrhea 11/06/2014  . Supratherapeutic INR   . HCAP (healthcare-associated pneumonia) 11/05/2014  . Rash 10/23/2014  . Gout 05/18/2014  . Erectile dysfunction 05/18/2014  . Pulmonary hypertension   . Diabetes mellitus, controlled   . Chronic diastolic CHF (congestive heart failure) 09/22/2013  . Hyperlipidemia 10/27/2011  . Obstructive sleep apnea 10/27/2011  . Essential hypertension 09/24/2011  . Atrial fibrillation-permanent     Past Medical History:  Past Medical History  Diagnosis Date  . Atrial fibrillation     a. 11/18/2011 s/p DCCV - 150J;  b. Anticoagulation w/ Apixaban;  c. 11/2011 back in afib->asymptomatic.  . H/O alcohol abuse     a. drinks heavily on the weekends.  . Hypertension   . Diabetes mellitus   . Heart murmur     a. 09/2011 Echo: EF 55-60%, Triv AI, Mild MR, mildly  dil LA.  . Diabetic peripheral neuropathy   . CKD (chronic kidney disease), stage III   . CHF (congestive heart failure)   . Pneumonia   . Sleep apnea    Past Surgical History:  Past Surgical History  Procedure Laterality Date  . Eye sx  11/11/2011    left eye  . Cardioversion  11/18/2011    Procedure: CARDIOVERSION;  Surgeon: Josue Hector, MD;  Location: [facility] ENDOSCOPY;  Service: Cardiovascular;  Laterality: N/A;  . Colonoscopy w/ biopsies and polypectomy    . Av fistula placement Left 02/13/2014    Procedure: INSERTION OF ARTERIOVENOUS (AV) GORE-TEX GRAFT ARM;  Surgeon: Angelia Mould, MD;  Location: [facility];  Service: Vascular;  Laterality: Left;  . Right heart catheterization N/A 10/11/2013    Procedure: RIGHT HEART CATH;  Surgeon: Larey Dresser, MD;  Location: [facility] CATH LAB;  Service: Cardiovascular;  Laterality: N/A;    Assessment & Plan Clinical Impression: Patient is a 59 y.o. year old male with history of DM type2, A fib with chronic Coumadin, ESRD, alcohol abuse, recent hospitalization 7/31-8/14/16 for encephalopathy due to limbic encephalitis with seizures, C diff colitis and demand ischemia who was discharged to SNF for rehab. He was readmitted on 12/09/14 with fever, N/V/D, persistent cough with mild hemoptysis, tachycardia and hypotension due to sepsis from LLL PNA. He was bolused with I liter IV fluids and started on IV Vanc and Zosyn and chronic coumadin changed to IV heparin. Patient with lethargy and MRI brain repeated showing "  persistent abnormal flare involving the mesial temporal lobes/hippocampi bilaterally, with question of trace diffusion abnormality within the right hippocampus felt to represent ongoing encephalitis".Patient did receive plasma exchange 5 days since completed her neurology services. Stools checked for C diff and showed evidence of colonization and remains on contact precautions. Acute on chronic anemia as well asBouts of hemoptysis since resolved  and Coumadin had been held for a short time and since resumed. He was receiving IV amiodarone and cardizem for control of heart rate and fluid overload managed by HD and has since been transitioned to by mouth.  Mentation has improved suspect acute encephalopathy but patient remains significantly deconditioned. .  Patient transferred to CIR on 12/26/2014 .    Patient currently requires min - mod A with basic self-care skills secondary to muscle weakness, decreased cardiorespiratoy endurance and decreased oxygen support, decreased problem solving, decreased safety awareness and decreased memory and decreased sitting balance, decreased standing balance, decreased postural control and decreased balance strategies.  Prior to hospitalization, patient could complete ADLs with independent .  Patient will benefit from skilled intervention to increase independence with basic self-care skills and increase level of independence with iADL prior to discharge home with care partner.  Anticipate patient will require 24 hour supervision and follow up home health.  OT - End of Session Activity Tolerance: Decreased this session Endurance Deficit: Yes Endurance Deficit Description: multiple rest breaks secondary to fatigue OT Assessment Rehab Potential (ACUTE ONLY): Fair Barriers to Discharge: Other (comment) Barriers to Discharge Comments: none known at this time OT Patient demonstrates impairments in the following area(s): Balance;Cognition;Safety;Endurance;Motor;Skin Integrity OT Basic ADL's Functional Problem(s): Grooming;Bathing;Dressing;Toileting OT Advanced ADL's Functional Problem(s): Simple Meal Preparation OT Transfers Functional Problem(s): Toilet;Tub/Shower OT Additional Impairment(s): None OT Plan OT Intensity: Minimum of 1-2 x/day, 45 to 90 minutes OT Frequency: 5 out of 7 days OT Duration/Estimated Length of Stay: 7-10 days OT Treatment/Interventions: Balance/vestibular training;Cognitive  remediation/compensation;Community reintegration;Patient/family education;Self Care/advanced ADL retraining;Therapeutic Exercise;UE/LE Coordination activities;UE/LE Strength taining/ROM;Therapeutic Activities;Functional mobility training;DME/adaptive equipment instruction;Discharge planning OT Self Feeding Anticipated Outcome(s): n/a OT Basic Self-Care Anticipated Outcome(s): overall supervision OT Toileting Anticipated Outcome(s): overall supervision OT Bathroom Transfers Anticipated Outcome(s): overall supervision OT Recommendation Recommendations for Other Services:  (none) Patient destination: Home Follow Up Recommendations: Home health OT;24 hour supervision/assistance Equipment Recommended: 3 in 1 bedside comode;Tub/shower bench   Skilled Therapeutic Intervention Upon entering the room, pt seated in recliner chair awaiting therapist arrival. Pt with no c/o pain this session. OT educated pt on OT purpose, POC, and goals with pt verbalizing understanding. Pt ambulated into bathroom with use of RW and min assist for safety. OT managing O2 lines during this session. Pt performing toileting and having successful BM this session with min A for balance during clothing management and hygiene. Pt engaged in bathing at shower level this session while seated on shower seat. Pt requiring min - mod A with balance while standing to wash buttocks in shower. Pt returning to sit on edge of recliner chair for dressing tasks. Pt requiring multiple rest breaks for fatigue this session and remained on 2L 02 via [place] with sats remaining above 95% this session. Pt remained in recliner chair with B LEs elevated secondary to edema. Call bell and all needed items within reach upon exiting the room.   OT Evaluation Precautions/Restrictions  Precautions Precautions: Fall Precaution Comments: oxygen needs, multiple lines/ leads Restrictions Weight Bearing Restrictions: No Vital Signs Therapy Vitals Temp: 98.6 F (37  C) Temp Source: Oral Pulse Rate: 87  Resp: 16 BP: 101/66 mmHg Patient Position (if appropriate): Sitting Oxygen Therapy SpO2: 100 % O2 Device: Nasal Cannula;Not Delivered O2 Flow Rate (L/min): 2 L/min Pain Pain Assessment Pain Assessment: No/denies pain Home Living/Prior Functioning Home Living Available Help at Discharge: Family, Available 24 hours/day, Other (Comment) (Wife currently works but he states she will be assisting him  and then will hire someone.) Type of Home: House Home Access: Stairs to enter Technical brewer of Steps: 4 Entrance Stairs-Rails: None Home Layout: One level Bathroom Shower/Tub: Tub/shower unit, Architectural technologist: Programmer, systems: Yes  Lives With: Spouse Prior Function Level of Independence: Independent with basic ADLs, Independent with homemaking with ambulation, Independent with gait, Independent with transfers  Able to Take Stairs?: Yes Driving: Yes Vocation: On disability Vision/Perception  Vision- History Baseline Vision/History: Wears glasses Wears Glasses: At all times Patient Visual Report:  (Pt states, " I need new glasses." He is able to read clock on wall correctly) Vision- Assessment Vision Assessment?: Yes Eye Alignment: Within Functional Limits Ocular Range of Motion: Within Functional Limits Tracking/Visual Pursuits: Able to track stimulus in all quads without difficulty Saccades: Within functional limits Convergence: Within functional limits Visual Fields: No apparent deficits  Cognition Overall Cognitive Status: Impaired/Different from baseline Arousal/Alertness: Awake/alert Orientation Level: Person;Place Year: Other (Comment) (2002) Month: September Day of Week: Correct Memory: Impaired Memory Impairment: Decreased recall of new information Immediate Memory Recall: Sock;Blue;Bed Memory Recall: Sock;Blue;Bed Memory Recall Sock: Without Cue Memory Recall Blue: Without Cue Memory Recall  Bed: With Cue Sensation Sensation Light Touch: Appears Intact Stereognosis: Not tested Hot/Cold: Appears Intact Proprioception: Appears Intact Coordination Gross Motor Movements are Fluid and Coordinated: Yes Fine Motor Movements are Fluid and Coordinated: No Motor  Motor Motor: Within Functional Limits Motor - Skilled Clinical Observations: decreased endurance and generalized weakness Mobility  Transfers Sit to Stand: 4: Min assist;3: Mod assist Sit to Stand Details: Verbal cues for safe use of DME/AE;Tactile cues for posture;Manual facilitation for weight shifting Stand to Sit: 3: Mod assist Stand to Sit Details (indicate cue type and reason): Tactile cues for posture;Verbal cues for safe use of DME/AE;Manual facilitation for weight shifting  Trunk/Postural Assessment  Cervical Assessment Cervical Assessment: Exceptions to [facility] (forward head) Thoracic Assessment Thoracic Assessment: Exceptions to [facility] (rounded shoulders) Lumbar Assessment Lumbar Assessment: Exceptions to [facility] (posterior pelvic tilt)  Balance Balance Balance Assessed: Yes Dynamic Sitting Balance Dynamic Sitting - Balance Support: Feet supported Dynamic Sitting - Level of Assistance: 5: Stand by assistance Dynamic Sitting - Balance Activities: Lateral lean/weight shifting;Forward lean/weight shifting;Reaching across midline Static Standing Balance Static Standing - Balance Support: During functional activity Static Standing - Level of Assistance: 5: Stand by assistance Dynamic Standing Balance Dynamic Standing - Balance Support: Left upper extremity supported;Right upper extremity supported Dynamic Standing - Level of Assistance: 4: Min assist Dynamic Standing - Balance Activities: Forward lean/weight shifting;Lateral lean/weight shifting;Reaching across midline Extremity/Trunk Assessment RUE Assessment RUE Assessment: Within Functional Limits (4/5 grossly) LUE Assessment LUE Assessment: Within Functional  Limits (4/5 grossly)   See Function Navigator for Current Functional Status.   Refer to Care Plan for Long Term Goals  Recommendations for other services: None  Discharge Criteria: Patient will be discharged from OT if patient refuses treatment 3 consecutive times without medical reason, if treatment goals not met, if there is a change in medical status, if patient makes no progress towards goals or if patient is discharged from hospital.  The above assessment, treatment plan, treatment alternatives and goals were discussed and  mutually agreed upon: by patient  [name] 12/27/2014, 4:28 PM

## 2014-12-28 ENCOUNTER — Inpatient Hospital Stay (HOSPITAL_COMMUNITY): Payer: 59 | Admitting: Speech Pathology

## 2014-12-28 ENCOUNTER — Inpatient Hospital Stay (HOSPITAL_COMMUNITY): Payer: 59 | Admitting: Occupational Therapy

## 2014-12-28 ENCOUNTER — Inpatient Hospital Stay (HOSPITAL_COMMUNITY): Payer: 59 | Admitting: Physical Therapy

## 2014-12-28 LAB — GLUCOSE, CAPILLARY
GLUCOSE-CAPILLARY: 118 mg/dL — AB (ref 65–99)
Glucose-Capillary: 153 mg/dL — ABNORMAL HIGH (ref 65–99)
Glucose-Capillary: 240 mg/dL — ABNORMAL HIGH (ref 65–99)

## 2014-12-28 LAB — CBC WITH DIFFERENTIAL/PLATELET
BASOS ABS: 0 10*3/uL (ref 0.0–0.1)
Basophils Relative: 0 % (ref 0–1)
Eosinophils Absolute: 0.4 10*3/uL (ref 0.0–0.7)
Eosinophils Relative: 3 % (ref 0–5)
HCT: 23.6 % — ABNORMAL LOW (ref 39.0–52.0)
Hemoglobin: 7.2 g/dL — ABNORMAL LOW (ref 13.0–17.0)
LYMPHS ABS: 2.1 10*3/uL (ref 0.7–4.0)
Lymphocytes Relative: 16 % (ref 12–46)
MCH: 30.3 pg (ref 26.0–34.0)
MCHC: 30.5 g/dL (ref 30.0–36.0)
MCV: 99.2 fL (ref 78.0–100.0)
MONO ABS: 1 10*3/uL (ref 0.1–1.0)
MONOS PCT: 8 % (ref 3–12)
NEUTROS PCT: 73 % (ref 43–77)
Neutro Abs: 9.6 10*3/uL — ABNORMAL HIGH (ref 1.7–7.7)
PLATELETS: 233 10*3/uL (ref 150–400)
RBC: 2.38 MIL/uL — AB (ref 4.22–5.81)
RDW: 23.2 % — AB (ref 11.5–15.5)
WBC: 13.1 10*3/uL — AB (ref 4.0–10.5)

## 2014-12-28 LAB — PROTIME-INR
INR: 2.85 — AB (ref 0.00–1.49)
PROTHROMBIN TIME: 29.4 s — AB (ref 11.6–15.2)

## 2014-12-28 LAB — RENAL FUNCTION PANEL
ALBUMIN: 3 g/dL — AB (ref 3.5–5.0)
Anion gap: 13 (ref 5–15)
BUN: 42 mg/dL — AB (ref 6–20)
CALCIUM: 8.1 mg/dL — AB (ref 8.9–10.3)
CO2: 25 mmol/L (ref 22–32)
CREATININE: 5.85 mg/dL — AB (ref 0.61–1.24)
Chloride: 94 mmol/L — ABNORMAL LOW (ref 101–111)
GFR, EST AFRICAN AMERICAN: 11 mL/min — AB (ref >60)
GFR, EST NON AFRICAN AMERICAN: 10 mL/min — AB (ref >60)
Glucose, Bld: 306 mg/dL — ABNORMAL HIGH (ref 65–99)
PHOSPHORUS: 3.4 mg/dL (ref 2.5–4.6)
Potassium: 3.3 mmol/L — ABNORMAL LOW (ref 3.5–5.1)
Sodium: 132 mmol/L — ABNORMAL LOW (ref 135–145)

## 2014-12-28 MED ORDER — WARFARIN SODIUM 2 MG PO TABS
2.0000 mg | ORAL_TABLET | Freq: Once | ORAL | Status: AC
  Administered 2014-12-28: 2 mg via ORAL
  Filled 2014-12-28: qty 1

## 2014-12-28 MED ORDER — CALCITRIOL 0.25 MCG PO CAPS
ORAL_CAPSULE | ORAL | Status: AC
  Filled 2014-12-28: qty 1

## 2014-12-28 MED ORDER — INSULIN GLARGINE 100 UNIT/ML ~~LOC~~ SOLN
12.0000 [IU] | Freq: Every day | SUBCUTANEOUS | Status: DC
  Administered 2014-12-28 – 2015-01-04 (×8): 12 [IU] via SUBCUTANEOUS
  Filled 2014-12-28 (×9): qty 0.12

## 2014-12-28 MED ORDER — CALCITRIOL 0.5 MCG PO CAPS
ORAL_CAPSULE | ORAL | Status: AC
  Filled 2014-12-28: qty 3

## 2014-12-28 NOTE — Progress Notes (Signed)
[facility] [facility] PHYSICAL MEDICINE & REHABILITATION     PROGRESS NOTE    Subjective/Complaints: Denies any issues over night. Stayed in recliner again. Therapy was "tough" but he knows it's what he needs.  ROS: Pt denies fever, rash/itching, headache, blurred or double vision, nausea, vomiting, abdominal pain, diarrhea, chest pain, shortness of breath, palpitations, dysuria, dizziness,  , bleeding, or depression   Objective: Vital Signs: Blood pressure 127/67, pulse 73, temperature 98.4 F (36.9 C), temperature source Oral, resp. rate 14, weight 78.4 kg (172 lb 13.5 oz), SpO2 100 %. No results found.  Recent Labs  12/26/14 1556  WBC 14.7*  HGB 7.5*  HCT 23.5*  PLT 207    Recent Labs  12/26/14 0508 12/26/14 1556  NA 134* 131*  K 3.5 3.2*  CL 97* 94*  GLUCOSE 151* 230*  BUN 31* 36*  CREATININE 4.56* 5.09*  CALCIUM 8.5* 8.1*   CBG (last 3)   Recent Labs  12/27/14 1634 12/27/14 2048 12/28/14 0722  GLUCAP 199* 286* 153*    Wt Readings from Last 3 Encounters:  12/28/14 78.4 kg (172 lb 13.5 oz)  12/26/14 79.9 kg (176 lb 2.4 oz)  12/02/14 82.237 kg (181 lb 4.8 oz)    Physical Exam:  Constitutional: He appears well-developed and well-nourished.  HENT: oral mucosa pink/moist Head: Normocephalic and atraumatic.  Eyes: Conjunctivae are normal. Pupils are equal, round, and reactive to light.  Neck: Normal range of motion. Neck supple.  Cardiovascular: An irregular rhythm present. Tachycardia present.  Respiratory: Effort normal. Improved breath sounds. He has no wheezes, no rales. He exhibits no tenderness. No dyspnea GI: Soft. Bowel sounds are normal. He exhibits no distension. There is no tenderness.  Musculoskeletal: He exhibits no edema or tenderness.  Neurological: He is alert.  Oriented to self, place, month. Able to follow simple one and two step motor commands. conversationally appropriate. Motor strength is 4 minus/5 in the right deltoid, biceps,  triceps, grip Left grip is 4/5, proximally not tested secondary to ongoing hemodialysis Lower extremity strength 4 minus hip flexor knee extensor ankle dorsiflexor plantar flexor Sensation diminished bilateral feet to light touch/pain.  Skin: 1.5" tear  on dorsum of left foot. kerlix wraps on either leg Ext: 2+ edema on either leg/foot  Assessment/Plan: 1. Functional deficits secondary to debility/encephalopathy which require 3+ hours per day of interdisciplinary therapy in a comprehensive inpatient rehab setting. Physiatrist is providing close team supervision and 24 hour management of active medical problems listed below. Physiatrist and rehab team continue to assess barriers to discharge/monitor patient progress toward functional and medical goals.  Function:  Bathing Bathing position   Position: Shower  Bathing parts Body parts bathed by patient: Right arm, Left arm, Chest, Abdomen, Front perineal area, Right upper leg, Left upper leg Body parts bathed by helper: Buttocks, Right lower leg, Left lower leg, Back  Bathing assist Assist Level: Supervision or verbal cues, Touching or steadying assistance(Pt > 75%)      Upper Body Dressing/Undressing Upper body dressing   What is the patient wearing?: Pull over shirt/dress     Pull over shirt/dress - Perfomed by patient: Thread/unthread right sleeve, Thread/unthread left sleeve, Put head through opening, Pull shirt over trunk          Upper body assist Assist Level: Set up      Lower Body Dressing/Undressing Lower body dressing   What is the patient wearing?: Pants, Non-skid slipper socks, Underwear Underwear - Performed by patient: Pull underwear up/down Underwear - Performed  by helper: Thread/unthread right underwear leg, Thread/unthread left underwear leg Pants- Performed by patient: Pull pants up/down Pants- Performed by helper: Thread/unthread right pants leg, Thread/unthread left pants leg   Non-skid slipper socks-  Performed by helper: Don/doff right sock, Don/doff left sock                  Lower body assist Assist Level: Supervision or verbal cues, Touching or steadying assistance (Pt > 75%)      Toileting Toileting   Toileting steps completed by patient: Adjust clothing prior to toileting Toileting steps completed by helper: Performs perineal hygiene, Adjust clothing after toileting Toileting Assistive Devices: Grab bar or rail  Toileting assist Assist level: Touching or steadying assistance (Pt.75%)   Transfers Chair/bed transfer   Chair/bed transfer method: Ambulatory Chair/bed transfer assist level: Touching or steadying assistance (Pt > 75%) Chair/bed transfer assistive device: Medical sales representative     Max distance: 60 Assist level: Touching or steadying assistance (Pt > 75%)   Wheelchair   Type: Manual Max wheelchair distance: 150 Assist Level: No help, No cues, assistive device, takes more than reasonable amount of time  Cognition Comprehension Comprehension assist level: Follows basic conversation/direction with extra time/assistive device  Expression Expression assist level: Expresses basic needs/ideas: With extra time/assistive device  Social Interaction Social Interaction assist level: Interacts appropriately with others with medication or extra time (anti-anxiety, antidepressant).  Problem Solving Problem solving assist level: Solves basic 75 - 89% of the time/requires cueing 10 - 24% of the time  Memory Memory assist level: Recognizes or recalls 25 - 49% of the time/requires cueing 50 - 75% of the time   Medical Problem List and Plan: 1. Functional deficits secondary to debilitation related to pneumonia/sepsis/ multi-medical with limbic encephalitis/seizure disorder 2. DVT Prophylaxis/Anticoagulation: Chronic Coumadin therapy. Serial inr's 3. Pain Management: Tylenol as needed 4. End-stage renal disease. Continue hemodialysis as indicated 5.  Neuropsych: This patient is capable of making decisions on his own behalf. 6. Skin/Wound Care: Routine skin checks. Elevate and protect bilateral legs/feet  -local care to skin tears  -volume/edema mgt per renal 7. Fluids/Electrolytes/Nutrition: good appetite and intake 8. Hypertension/atrial fibrillation. Amiodarone 400 mg daily, Cardizem 30 mg 4 times a day, Lopressor 75 mg twice a day. Monitor with increased mobility 9. Seizure disorder. Dilantin 300 mg daily. Monitor for seizure activity 10. Acute on chronic anemia. hgb 7.5 most recently, aranesp 11. Diabetes: increase lantus to 12u qhs, continue SSI  LOS (Days) 2 A FACE TO FACE EVALUATION WAS PERFORMED  Joseph [name] T 12/28/2014 8:23 AM

## 2014-12-28 NOTE — Progress Notes (Signed)
ANTICOAGULATION CONSULT NOTE - Follow Up Consult  Pharmacy Consult for Coumadin Indication: atrial fibrillation  Allergies  Allergen Reactions  . Keflex [Cephalexin] Other (See Comments)    c-diff reaction  . Dilaudid [Hydromorphone] Nausea And Vomiting    Patient Measurements: Weight: 172 lb 13.5 oz (78.4 kg) Heparin Dosing Weight:   Vital Signs: Temp: 98.6 F (37 C) (09/09 1228) Temp Source: Oral (09/09 1228) BP: 121/70 mmHg (09/09 1237) Pulse Rate: 85 (09/09 1228)  Labs:  Recent Labs  12/26/14 0508 12/26/14 1556 12/27/14 0843 12/28/14 0604  HGB  --  7.5*  --   --   HCT  --  23.5*  --   --   PLT  --  207  --   --   LABPROT 19.6*  --  22.0* 29.4*  INR 1.66*  --  1.93* 2.85*  CREATININE 4.56* 5.09*  --   --     Estimated Creatinine Clearance: 17.2 mL/min (by C-G formula based on Cr of 5.09).   Medications:  Scheduled:  . amiodarone  400 mg Oral Daily  . atorvastatin  40 mg Oral QHS  . calcitRIOL  1.75 mcg Oral Q M,W,F-HD  . colchicine  0.6 mg Oral Daily  . [START ON 12/31/2014] darbepoetin (ARANESP) injection - DIALYSIS  150 mcg Intravenous Q Mon-HD  . diltiazem  30 mg Oral 4 times per day  . feeding supplement (NEPRO CARB STEADY)  237 mL Oral BID BM  . fluocinonide ointment   Topical QID  . guaiFENesin  600 mg Oral BID  . heparin  1,000 Units Intracatheter Once  . heparin  1,000 Units Intracatheter Once  . insulin aspart  0-15 Units Subcutaneous TID WC  . insulin glargine  12 Units Subcutaneous QHS  . metoprolol tartrate  50 mg Oral BID  . multivitamin  1 tablet Oral QHS  . nystatin   Topical BID  . phenytoin  300 mg Oral q morning - 10a  . saccharomyces boulardii  250 mg Oral BID  . Warfarin - Pharmacist Dosing Inpatient   Does not apply q1800    Assessment: 59yo male with AFib.  INR 2.85, a significant jump in last 24h.  Pt is on Amiodarone and Phenytoin, both of which may increase Coumadin effect.  Pt appears to have received an extra dose of  Amiodarone on day of transfer to rehab, which have resulted in this jump.  No bleeding noted.  Will give a smaller dose of Coumadin  Goal of Therapy:  INR 2-3 Monitor platelets by anticoagulation protocol: Yes   Plan:  Coumadin 2mg   Daily INR  Gracy Bruins, PharmD Clinical Pharmacist [facility]

## 2014-12-28 NOTE — Progress Notes (Signed)
Occupational Therapy Session Note  Patient Details  Name: Joseph [name] MRN: [id number] Date of Birth: Oct 10, 1955  Today's Date: 12/28/2014 OT Individual Time: 6301-6010 OT Individual Time Calculation (min): 75 min  and Today's Date: 12/28/2014 OT Missed Time: 15 Minutes Missed Time Reason: Patient fatigue   Short Term Goals: Week 1:  OT Short Term Goal 1 (Week 1): STGs=LTGS secondary to estimated short LOS  Skilled Therapeutic Interventions/Progress Updates:    Upon entering the room, pt seated in recliner chair with no c/o pain this session. Pt declined shower this session but agreed to wash and dressing at sink side with sit <>stand. Skilled OT intervention with focus on self care retraining, balance, functional mobility, and energy conservation. Pt ambulating to bathroom with RW and min guard for BM this morning. During hygiene, pt with severe anterior weight shift and requiring mod A to correct. Pt required min A standing balance for bathing and dressing tasks with min verbal cues for initiation,sequencing, and proper technique. Pt required cues for hand placement for sit <>stand as well. Energy conservation education related to self care as pt required frequent rest breaks secondary to fatigue. Pt on 3L O2 via [place] this session with sat remaining above 97% with tasks. Pt returned to recliner chair and OT wrapped B LEs with ACE wrap and elevated B LEs as well. Call bell and all needed items within reach upon exiting the room.   Therapy Documentation Precautions:  Precautions Precautions: Fall Precaution Comments: oxygen needs, multiple lines/ leads Restrictions Weight Bearing Restrictions: No General: General OT Amount of Missed Time: 15 Minutes Vital Signs: Therapy Vitals Pulse Rate: 73 BP: 127/67 mmHg  See Function Navigator for Current Functional Status.   Therapy/Group: Individual Therapy  [name] 12/28/2014, 9:53 AM

## 2014-12-28 NOTE — Care Management Note (Signed)
[facility] Individual Statement of Services  Patient Name:  Joseph [name]  Date:  12/28/2014  Welcome to the [facility].  Our goal is to provide you with an individualized program based on your diagnosis and situation, designed to meet your specific needs.  With this comprehensive rehabilitation program, you will be expected to participate in at least 3 hours of rehabilitation therapies Monday-Friday, with modified therapy programming on the weekends.  Your rehabilitation program will include the following services:  Physical Therapy (PT), Occupational Therapy (OT), Speech Therapy (ST), 24 hour per day rehabilitation nursing, Therapeutic Recreaction (TR), Neuropsychology, Case Management (Social Worker), Rehabilitation Medicine, Nutrition Services and Pharmacy Services  Weekly team conferences will be held on Tuesdays to discuss your progress.  Your Social Worker will talk with you frequently to get your input and to update you on team discussions.  Team conferences with you and your family in attendance may also be held.  Expected length of stay: 10 days  Overall anticipated outcome: supervision  Depending on your progress and recovery, your program may change. Your Social Worker will coordinate services and will keep you informed of any changes. Your Social Worker's name and contact numbers are listed  below.  The following services may also be recommended but are not provided by the [facility]:    [place] will be made to provide these services after discharge if needed.  Arrangements include referral to agencies that provide these services.  Your insurance has been verified to be:  Medicare Your primary doctor is:  Dr. Azalia Bilis (establishing "new patient" care)  Pertinent information will be shared with your doctor and your insurance  company.  Social Worker:  [place], [place] or ([phone]   Information discussed with and copy given to patient by: Lennart Pall, 12/28/2014, 2:40 PM

## 2014-12-28 NOTE — Progress Notes (Signed)
Speech Language Pathology Daily Session Note  Patient Details  Name: Joseph [name] MRN: [id number] Date of Birth: 1955/07/22  Today's Date: 12/28/2014 SLP Individual Time: 1330-1430 SLP Individual Time Calculation (min): 60 min  Short Term Goals: Week 1: SLP Short Term Goal 1 (Week 1): Patient will demonstrate functional problem solving for mildly complex but familair tasks with Min A multimodal cues.  SLP Short Term Goal 2 (Week 1): Patient will utilize memory compensatory strategies to recall new, daily information with Mod A multimodal cues.  SLP Short Term Goal 3 (Week 1): Patient will self-monitor and correct errors during functional tasks with Min A multimodal cues.   Skilled Therapeutic Interventions: Skilled treatment session focused on cognitive goals. Upon arrival, patient was sitting upright in recliner with wife present. SLP facilitated session by providing extra time and Min A question cues for functional problem solving with a basic money management task. Patient also participated in a basic written expression task and required Min A question cues for recall of biographical information (address). Patient required Max A question cues to recall events from morning therapy sessions with use of aids, however, patient with decreased visual acuity which impacts his overall function and ability to use visual aids. Patient's wife educated in regards to patient's current cognitive function and goals of skilled SLP intervention, she verbalized understanding. Patient left upright in recliner with all needs within reach. Continue with current plan of care.    Function:   Cognition Comprehension Comprehension assist level: Follows basic conversation/direction with extra time/assistive device  Expression   Expression assist level: Expresses basic needs/ideas: With extra time/assistive device  Social Interaction Social Interaction assist level: Interacts appropriately with others with medication  or extra time (anti-anxiety, antidepressant).  Problem Solving Problem solving assist level: Solves basic 75 - 89% of the time/requires cueing 10 - 24% of the time  Memory Memory assist level: Recognizes or recalls 25 - 49% of the time/requires cueing 50 - 75% of the time    Pain Pain Assessment Pain Assessment: No/denies pain  Therapy/Group: Individual Therapy  Kimi Kroft 12/28/2014, 3:36 PM

## 2014-12-28 NOTE — Progress Notes (Signed)
Physical Therapy Session Note  Patient Details  Name: Joseph [name] MRN: [id number] Date of Birth: 30-Jul-1955  Today's Date: 12/28/2014 PT Individual Time: 1100-1200 PT Individual Time Calculation (min): 60 min   Short Term Goals: Week 1:  PT Short Term Goal 1 (Week 1): Pt will demonstrate transfers with steady A and LRAD PT Short Term Goal 2 (Week 1): Pt will amb 150' with LRAD and steady A PT Short Term Goal 3 (Week 1): Pt will propel w/c x150' with mod I PT Short Term Goal 4 (Week 1): Pt will demonstrate w/c parts management with supervision in 75% of observed attemps PT Short Term Goal 5 (Week 1): Pt will negotiate 4 steps with 1 hand rail  Skilled Therapeutic Interventions/Progress Updates:    Pt received in recliner and agreeable to therapy; pt did not remember PT from previous day or whether or not he'd had any therapy this morning.  PT instructed patient in stand pivot transfer from recliner to w/c with steady A and no AD with verbal cues for sequencing, hand placement, and forward weight shift.  Pt propelled w/c x150' to therapy gym with increased time and cues for location of gym.  PT instructed patient in NuStep for LE strengthening and overall endurance.  Pt able to tolerate ~1-1.5 min of NuStep at a time with 30 second rest breaks in between on resistance 2 for 10 minutes (5-6 bouts of exercise).  Vitals assessed and WNL (see below), pt reports that his legs just felt weak.  Wife Joseph [name]) arrived after NuStep and agreeable to family education.  PT instructed Joseph [name] in safe transfers with RW including hand placement for steady A and verbal cues for hand/foot placement. Joseph [name] demonstrates understanding as she assists pt with transfer from NuStep to therapy mat (ambulatory with RW) with supervision from PT.  PT instructed patient in dynamic standing balance activities, reaching for horseshoes outside BOS and hanging on basketball goal, then removing from goal and reaching outside BOS to hand  off to wife emphasizing dynamic balance with single UE support, and catching/throwing soft basketball to emphasize dynamic standing balance with no UE support.  PT instructed patient in ambulation with RW back to room x150' with RW, providing education to [place] on safe steady A with gait (hand placement, verbal cues for patient, etc).  Joseph [name] demonstrated safe assist for pt transfer from w/c>recliner and signed off on transfers by PT.  Pt positioned to comfort with needs in reach and Joseph [name] present.    Therapy Documentation Precautions:  Precautions Precautions: Fall Precaution Comments: oxygen needs, multiple lines/ leads Restrictions Weight Bearing Restrictions: No  Vital Signs: Therapy Vitals Pulse Rate: 100 Oxygen Therapy SpO2: 98 % O2 Device: Nasal Cannula O2 Flow Rate (L/min): 3 L/min Pulse Oximetry Type: Intermittent   Pain: Pain Assessment Pain Assessment: No/denies pain   See Function Navigator for Current Functional Status.   Therapy/Group: Individual Therapy  Earnest Conroy Penven-Crew 12/28/2014, 12:25 PM

## 2014-12-28 NOTE — IPOC Note (Signed)
Overall Plan of Care [facility]) Patient Details Name: Joseph [name] MRN: [id number] DOB: Apr 03, 1956  Admitting Diagnosis: DEBILITY PNA ENCEPHALITIS  Hospital Problems: Principal Problem:   Debilitated Active Problems:   Atrial fibrillation-permanent   ESRD on hemodialysis   Encephalopathy     Functional Problem List: Nursing Behavior, Bowel, Edema, Endurance, Medication Management, Nutrition, Safety, Sensory, Skin Integrity  PT Balance, Endurance, Motor, Pain, Safety  OT Balance, Cognition, Safety, Endurance, Motor, Skin Integrity  SLP Cognition  TR         Basic ADL's: OT Grooming, Bathing, Dressing, Toileting     Advanced  ADL's: OT Simple Meal Preparation     Transfers: PT Bed Mobility, Bed to Chair, Car, Manufacturing systems engineer, Metallurgist: PT Ambulation, Emergency planning/management officer, Stairs     Additional Impairments: OT None  SLP Social Cognition   Social Interaction, Problem Solving, Memory, Attention, Awareness  TR      Anticipated Outcomes Item Anticipated Outcome  Self Feeding n/a  Swallowing      Basic self-care  overall supervision  Toileting  overall supervision   Bathroom Transfers overall supervision  Bowel/Bladder  Oliguric, min assist with bowel  Transfers  supervision  Locomotion  supervision  Communication     Cognition  Min-Mod A   Pain  <3 on a 0-10 pain scale  Safety/Judgment  mod assist   Therapy Plan: PT Intensity: Minimum of 1-2 x/day ,45 to 90 minutes PT Frequency: 5 out of 7 days PT Duration Estimated Length of Stay: 10-12 days OT Intensity: Minimum of 1-2 x/day, 45 to 90 minutes OT Frequency: 5 out of 7 days OT Duration/Estimated Length of Stay: 7-10 days SLP Intensity: Minumum of 1-2 x/day, 30 to 90 minutes SLP Frequency: 3 to 5 out of 7 days SLP Duration/Estimated Length of Stay: 7-10 days        Team Interventions: Nursing Interventions Patient/Family Education, Bowel Management, Disease  Management/Prevention, Pain Management, Medication Management, Skin Care/Wound Management, Discharge Planning, Psychosocial Support  PT interventions Ambulation/gait training, Medical illustrator training, Discharge planning, Functional mobility training, DME/adaptive equipment instruction, Neuromuscular re-education, Patient/family education, Stair training, Therapeutic Activities, Therapeutic Exercise, UE/LE Coordination activities, UE/LE Strength taining/ROM, Wheelchair propulsion/positioning  OT Interventions Training and development officer, Cognitive remediation/compensation, Academic librarian, Barrister's clerk education, Self Care/advanced ADL retraining, Therapeutic Exercise, UE/LE Coordination activities, UE/LE Strength taining/ROM, Therapeutic Activities, Functional mobility training, DME/adaptive equipment instruction, Discharge planning  SLP Interventions Cognitive remediation/compensation, Cueing hierarchy, Environmental controls, Internal/external aids, Functional tasks, Patient/family education, Therapeutic Activities  TR Interventions    SW/CM Interventions Discharge Planning, Psychosocial Support, Patient/Family Education    Team Discharge Planning: Destination: PT-Home ,OT- Home , SLP-Home Projected Follow-up: PT-Home health PT, OT-  Home health OT, 24 hour supervision/assistance, SLP-Home Health SLP, 24 hour supervision/assistance Projected Equipment Needs: PT-To be determined, OT- 3 in 1 bedside comode, Tub/shower bench, SLP-None recommended by SLP Equipment Details: PT- , OT-  Patient/family involved in discharge planning: PT- Patient,  OT- , SLP-Patient  MD ELOS: 7-10 days Medical Rehab Prognosis:  Excellent Assessment: The patient has been admitted for CIR therapies with the diagnosis of debility, encephalopathy. The team will be addressing functional mobility, strength, stamina, balance, safety, adaptive techniques and equipment, self-care, bowel and bladder mgt, patient and  caregiver education, edema control, community reintegration, ctivity tolerance, pain control. Goals have been set at supervision for mobility and self-care and min to mod assist with cognition.    Meredith Staggers, MD, [facility]      See Team Conference Notes  for weekly updates to the plan of care

## 2014-12-28 NOTE — Progress Notes (Signed)
Social Work  Social Work Assessment and Plan  Patient Details  Name: Joseph [name] MRN: [id number] Date of Birth: July 10, 1955  Today's Date: 12/28/2014  Problem List:  Patient Active Problem List   Diagnosis Date Noted  . Debilitated 12/26/2014  . Acute encephalopathy   . Autoimmune encephalomyelitis   . Debility   . Atrial fibrillation with RVR   . Encephalopathy acute   . Nausea vomiting and diarrhea 12/10/2014  . Sepsis 12/10/2014  . Pneumonia 12/10/2014  . Encephalitis   . Convulsion   . Recurrent Clostridium difficile diarrhea   . Limbic encephalitis   . Confusion   . Encephalopathy   . Hyperglycemia 11/18/2014  . Delirium 11/18/2014  . DKA, type 2 11/18/2014  . Hyperkalemia 11/18/2014  . Protein-calorie malnutrition, severe 11/09/2014  . Volume overload 11/06/2014  . ESRD on hemodialysis 11/06/2014  . Dyspnea 11/06/2014  . Abnormal LFTs 11/06/2014  . C. difficile diarrhea 11/06/2014  . Supratherapeutic INR   . HCAP (healthcare-associated pneumonia) 11/05/2014  . Rash 10/23/2014  . Gout 05/18/2014  . Erectile dysfunction 05/18/2014  . Pulmonary hypertension   . Diabetes mellitus, controlled   . Chronic diastolic CHF (congestive heart failure) 09/22/2013  . Hyperlipidemia 10/27/2011  . Obstructive sleep apnea 10/27/2011  . Essential hypertension 09/24/2011  . Atrial fibrillation-permanent    Past Medical History:  Past Medical History  Diagnosis Date  . Atrial fibrillation     a. 11/18/2011 s/p DCCV - 150J;  b. Anticoagulation w/ Apixaban;  c. 11/2011 back in afib->asymptomatic.  . H/O alcohol abuse     a. drinks heavily on the weekends.  . Hypertension   . Diabetes mellitus   . Heart murmur     a. 09/2011 Echo: EF 55-60%, Triv AI, Mild MR, mildly dil LA.  . Diabetic peripheral neuropathy   . CKD (chronic kidney disease), stage III   . CHF (congestive heart failure)   . Pneumonia   . Sleep apnea    Past Surgical History:  Past Surgical History   Procedure Laterality Date  . Eye sx  11/11/2011    left eye  . Cardioversion  11/18/2011    Procedure: CARDIOVERSION;  Surgeon: Josue Hector, MD;  Location[facility] ENDOSCOPY;  Service: Cardiovascular;  Laterality: N/A;  . Colonoscopy w/ biopsies and polypectomy    . Av fistula placement Left 02/13/2014    Procedure: INSERTION OF ARTERIOVENOUS (AV) GORE-TEX GRAFT ARM;  Surgeon: Angelia Mould, MD;  Location: [place];  Service: Vascular;  Laterality: Left;  . Right heart catheterization N/A 10/11/2013    Procedure: RIGHT HEART CATH;  Surgeon: Larey Dresser, MD;  Location: [facility] CATH LAB;  Service: Cardiovascular;  Laterality: N/A;   Social History:  reports that he quit smoking about 20 years ago. His smoking use included Cigarettes. He has a .05 pack-year smoking history. He has never used smokeless tobacco. He reports that he does not drink alcohol or use illicit drugs.  Family / Support Systems Marital Status: Married How Long?: 22 yrs Patient Roles: Spouse Spouse/Significant Other: wife, Joseph [name] @ [phone] or (C) [phone] Children: None Other Supports: pt's mother-in-law and sister-in-law can provide assistance;  wife also considering hiring private duty. Anticipated Caregiver: wife, hired caregivers, Mother in Sports coach and sister in law Ability/Limitations of Caregiver: wife works days but will arrange 24/7 supervision for d/c Caregiver Availability: 24/7 Family Dynamics: Pt very supportive to pt during assessment interview and offers assistance.  Very committed to take pt home and  to coordinating 24/7 care.  Social History Preferred language: English Religion: Catholic Cultural Background: NA Read: Yes Write: Yes Employment Status: Disabled Date Retired/Disabled/Unemployed: 2 yrs Freight forwarder Issues: None Guardian/Conservator: None - per MD, pt capable of making decisions on his own behalf.   Abuse/Neglect Physical Abuse: Denies Verbal Abuse:  Denies Sexual Abuse: Denies Exploitation of patient/patient's resources: Denies Self-Neglect: Denies  Emotional Status Pt's affect, behavior adn adjustment status: Pt very pleasant and makes attempts to answer questions, however, poor recall of recent events and needs wife's assistance.  Better long term memory.  He denies any s/s of any emotional distress and no h/o psych issues.  Polite and agreeable to questioning and responsive to wife's support Recent Psychosocial Issues: No issues prior to mid July then began lengthy sequence of medical issues and cognitive decline. Pyschiatric History: None Substance Abuse History: Attempted some questioning about this and pt and wife simply report that he does have drug and ETOH abuse hx but do not elaborate.  Patient / Family Perceptions, Expectations & Goals Pt/Family understanding of illness & functional limitations: Pt really unable to identify any current medical issues and needs wife to provide report on medical course since July.  Wife with good understanding that pt experiencing an encephalitis "...but I don't know that they can tell us what to expect for his longer term (cognitive) recovery..." Premorbid pt/family roles/activities: Prior to July, pt completely independent and was using SCAT to get back and forth to HD.  Wife notes that he was handling HD well and able to get out at community level even on HD days. Anticipated changes in roles/activities/participation: Pt will require supervision at least and pt/wife understanding this.  Pt/family expectations/goals: "I just hope that getting him home to a familiar setting that he will get better (cognitively)."  [employer]: Other (Comment) (HD at [facility]) Premorbid Home Care/DME Agencies: None [facility] in past) Transportation available at discharge: wife or SCAT Resource referrals recommended: Neuropsychology  Discharge Planning Living Arrangements:  Spouse/significant other Support Systems: Spouse/significant other, Other relatives, Friends/neighbors Type of Residence: Private residence Insurance Resources: Chartered certified accountant Resources: [employer] Financial Screen Referred: No Living Expenses: Higher education careers adviser Management: Spouse Does the patient have any problems obtaining your medications?: No Home Management: Wife primarily Patient/Family Preliminary Plans: Wife reports that she plans to bring pt home and arrange 24/7 support Social Work Anticipated Follow Up Needs: HH/OP Expected length of stay: 7-10 days  Clinical Impression Pleasant gentleman here following chronic hospitalizations since July and very debilitated as well as cognitive deficits.  Wife is very supportive and encouraging.  Assists with completion of assessment interview.  She reports she is very committed to taking pt home and arranging 24/7 care (which will likley involve private duty care).  Pt denies any s/s of emotional distress, however, will refer to neuropsychology for further cognitive screening.  Will follow for support and d/c planning.  Murial Beam 12/28/2014, 2:37 PM

## 2014-12-28 NOTE — Progress Notes (Signed)
Subjective:   Tired from therapy. Feels good.   Objective Filed Vitals:   12/28/14 0807 12/28/14 1225 12/28/14 1228 12/28/14 1237  BP: 127/67  120/78 121/70  Pulse: 73 100 85   Temp:   98.6 F (37 C)   TempSrc:   Oral   Resp:   16   Weight:      SpO2:  98% 100%    Physical Exam General: sitting in chair, wife visiting. No acute distress.  Heart: RRR Lungs: CTA, unlabored  Abdomen: soft, nontendber +BS  Extremities: +2LE edema/ bilat legs ACE wrapped  Dialysis Access:  L AVG +b/t  Dialysis Orders: [facility] 4 hrs 80kgs 3K/2.5Ca No Heparin L AVG  Profile 2 Micera 100 q 2 weeks- to start 8/24 Calcitriol 1.75  Assessment: 1. Sepsis- off antibiotics. Needs repeat chest xray in 4 weeks. completed po vanc for cdiff 2. Acute encephalopathy/ "limbic" encephalitis- improving. S/p plasma exchanges- last 9/3 2. ESRD - MWF [facility]- HD pending today 3. Anemia - aranesp q Monday- hgb 7.5- aranesp 150q monday 4. Secondary hyperparathyroidism - ca+ 8.1/phos 3.8. Cont calcitriol.  5. HTN/volume excess - LE edema, continue to lower dry wtSignificant edema remains and po dilt and MTP (for afib rate control ) are contributing to this problem. Have lowered MTP to 50 bid, taper down dilt next if HR remains controlled. Is on amio already. 6. Nutrition - appetite improving. Alb 3.2.renal diet.  7 afib- on coumadin and amiodarone 8. Seizure- dilantin 9. DM  [name], NP [place] [phone] 12/28/2014,12:49 PM  LOS: 2 days   Pt seen, examined and agree w A/P as above.  Kelly Splinter MD pager [phone]    cell [phone] 12/28/2014, 7:12 PM     Additional Objective Labs: Basic Metabolic Panel:  Recent Labs Lab 12/24/14 1530 12/25/14 0712 12/26/14 0508 12/26/14 1556  NA 136 137 134* 131*  K 3.7 3.4* 3.5 3.2*  CL 105 100* 97* 94*  CO2 22 27 27 25   GLUCOSE 277* 121* 151* 230*  BUN 34* 15 31* 36*  CREATININE 4.88* 3.20* 4.56* 5.09*  CALCIUM 8.3*  8.4* 8.5* 8.1*  PHOS 3.7 3.2 3.8  --    Liver Function Tests:  Recent Labs Lab 12/24/14 1530 12/25/14 0712 12/26/14 0508  ALBUMIN 3.2* 3.5 3.2*   No results for input(s): LIPASE, AMYLASE in the last 168 hours. CBC:  Recent Labs Lab 12/22/14 1745  12/23/14 0545 12/24/14 1530 12/25/14 0724 12/26/14 1556  WBC 14.3*  --  14.1* 15.9* 13.9* 14.7*  NEUTROABS  --   --   --  11.7*  --   --   HGB 7.8*  < > 7.7* 7.0* 7.6* 7.5*  HCT 23.9*  < > 23.9* 22.2* 24.4* 23.5*  MCV 93.0  --  93.7 95.3 96.4 97.1  PLT 267  --  229 202 206 207  < > = values in this interval not displayed. Blood Culture    Component Value Date/Time   SDES BLOOD RIGHT HAND 12/18/2014 1215   SPECREQUEST BOTTLES DRAWN AEROBIC AND ANAEROBIC 5CC 12/18/2014 1215   CULT NO GROWTH 5 DAYS 12/18/2014 1215   REPTSTATUS 12/23/2014 FINAL 12/18/2014 1215    Cardiac Enzymes: No results for input(s): CKTOTAL, CKMB, CKMBINDEX, TROPONINI in the last 168 hours. CBG:  Recent Labs Lab 12/27/14 1151 12/27/14 1634 12/27/14 2048 12/28/14 0722 12/28/14 1201  GLUCAP 171* 199* 286* 153* 240*   Iron Studies:  Recent Labs  12/26/14 1556  IRON 46  TIBC NOT CALCULATED   @lablastinr3 @ Studies/Results: No results found. Medications:   . amiodarone  400 mg Oral Daily  . atorvastatin  40 mg Oral QHS  . calcitRIOL  1.75 mcg Oral Q M,W,F-HD  . colchicine  0.6 mg Oral Daily  . [START ON 12/31/2014] darbepoetin (ARANESP) injection - DIALYSIS  150 mcg Intravenous Q Mon-HD  . diltiazem  30 mg Oral 4 times per day  . feeding supplement (NEPRO CARB STEADY)  237 mL Oral BID BM  . fluocinonide ointment   Topical QID  . guaiFENesin  600 mg Oral BID  . heparin  1,000 Units Intracatheter Once  . heparin  1,000 Units Intracatheter Once  . insulin aspart  0-15 Units Subcutaneous TID WC  . insulin glargine  12 Units Subcutaneous QHS  . metoprolol tartrate  50 mg Oral BID  . multivitamin  1 tablet Oral QHS  . nystatin   Topical BID   . phenytoin  300 mg Oral q morning - 10a  . saccharomyces boulardii  250 mg Oral BID  . Warfarin - Pharmacist Dosing Inpatient   Does not apply [phone]

## 2014-12-29 ENCOUNTER — Inpatient Hospital Stay (HOSPITAL_COMMUNITY): Payer: 59 | Admitting: Physical Therapy

## 2014-12-29 ENCOUNTER — Inpatient Hospital Stay (HOSPITAL_COMMUNITY): Payer: 59 | Admitting: Occupational Therapy

## 2014-12-29 ENCOUNTER — Inpatient Hospital Stay (HOSPITAL_COMMUNITY): Payer: 59 | Admitting: Speech Pathology

## 2014-12-29 DIAGNOSIS — I482 Chronic atrial fibrillation: Secondary | ICD-10-CM

## 2014-12-29 LAB — PROTIME-INR
INR: 2.53 — ABNORMAL HIGH (ref 0.00–1.49)
PROTHROMBIN TIME: 26.9 s — AB (ref 11.6–15.2)

## 2014-12-29 LAB — GLUCOSE, CAPILLARY
GLUCOSE-CAPILLARY: 209 mg/dL — AB (ref 65–99)
GLUCOSE-CAPILLARY: 266 mg/dL — AB (ref 65–99)
Glucose-Capillary: 123 mg/dL — ABNORMAL HIGH (ref 65–99)
Glucose-Capillary: 164 mg/dL — ABNORMAL HIGH (ref 65–99)

## 2014-12-29 MED ORDER — WARFARIN SODIUM 3 MG PO TABS
3.0000 mg | ORAL_TABLET | Freq: Once | ORAL | Status: DC
  Filled 2014-12-29: qty 1

## 2014-12-29 MED ORDER — DIPHENHYDRAMINE HCL 25 MG PO CAPS
25.0000 mg | ORAL_CAPSULE | Freq: Four times a day (QID) | ORAL | Status: DC | PRN
  Administered 2014-12-29 – 2015-01-04 (×13): 25 mg via ORAL
  Filled 2014-12-29 (×11): qty 1

## 2014-12-29 MED ORDER — WARFARIN SODIUM 2 MG PO TABS
2.0000 mg | ORAL_TABLET | Freq: Once | ORAL | Status: AC
  Administered 2014-12-29: 2 mg via ORAL
  Filled 2014-12-29: qty 1

## 2014-12-29 NOTE — Progress Notes (Signed)
Speech Language Pathology Daily Session Note  Patient Details  Name: Joseph [name] MRN: [id number] Date of Birth: [birth year]  Today's Date: 12/29/2014 SLP Individual Time: 3570-1779 SLP Individual Time Calculation (min): 45 min  Short Term Goals: Week 1: SLP Short Term Goal 1 (Week 1): Patient will demonstrate functional problem solving for mildly complex but familair tasks with Min A multimodal cues.  SLP Short Term Goal 2 (Week 1): Patient will utilize memory compensatory strategies to recall new, daily information with Mod A multimodal cues.  SLP Short Term Goal 3 (Week 1): Patient will self-monitor and correct errors during functional tasks with Min A multimodal cues.   Skilled Therapeutic Interventions: Skilled treatment session focused on cognitive goals. SLP facilitated session by providing Max A question cues for recall of events from last night (dialysis) and from therapy session this morning. SLP provided external aids that patient was able to utilize despite his reported decreased visual acuity at this time with Min A question cues that focused on orientation and recall of his current therapy schedule. Patient left upright in recliner with all needs within reach. Continue with current plan of care.   Function:  Cognition Comprehension Comprehension assist level: Understands basic 90% of the time/cues < 10% of the time  Expression   Expression assist level: Expresses basic 90% of the time/requires cueing < 10% of the time.  Social Interaction Social Interaction assist level: Interacts appropriately with others with medication or extra time (anti-anxiety, antidepressant).  Problem Solving Problem solving assist level: Solves basic 75 - 89% of the time/requires cueing 10 - 24% of the time  Memory Memory assist level: Recognizes or recalls 25 - 49% of the time/requires cueing 50 - 75% of the time    Pain Pain Assessment Pain Assessment: No/denies pain  Therapy/Group: Individual  Therapy  Pharell Rolfson, [place] 12/29/2014, 11:18 AM

## 2014-12-29 NOTE — Progress Notes (Signed)
Occupational Therapy Session Note  Patient Details  Name: Joseph [name] MRN: [id number] Date of Birth: 07/03/1955  Today's Date: 12/29/2014 OT Individual Time: 9211-9417 OT Individual Time Calculation (min): 54 min    Short Term Goals: Week 1:  OT Short Term Goal 1 (Week 1): STGs=LTGS secondary to estimated short LOS  Skilled Therapeutic Interventions/Progress Updates:  Upon entering the room, pt sleeping in recliner chair. Pt with no c/o pain this session. Pt declined shower this morning and requests dressing and bathing with sit <>stand at sink side this session. Pt remained of 2L O2 via [place] this session. Pt ambulating 20' with min guard into bathroom with RW . Pt able to perform clothing management and hygiene this session with steady assist for balance. Pt then began bathing and dressing while seated in wheelchair. Pt requiring min verbal cues for initiation and sequencing for these routine tasks. Min verbal cues for safety awareness with wheelchair and for hand placement with sit <>stand. Pt demonstrating controlled descend this session. Pt returning to recliner chair , B LEs eleavted, and call bell within reach upon exiting the room.   Therapy Documentation Precautions:  Precautions Precautions: Fall Precaution Comments: oxygen needs, multiple lines/ leads Restrictions Weight Bearing Restrictions: No General:   Vital Signs: Therapy Vitals Temp: 98.8 F (37.1 C) Temp Source: Oral Pulse Rate: 95 Resp: 20 BP: 102/69 mmHg Patient Position (if appropriate): Sitting Oxygen Therapy SpO2: 100 % O2 Device: Nasal Cannula O2 Flow Rate (L/min): 2 L/min  See Function Navigator for Current Functional Status.   Therapy/Group: Individual Therapy  [name] 12/29/2014, 7:55 AM

## 2014-12-29 NOTE — Progress Notes (Signed)
Physical Therapy Session Note  Patient Details  Name: Joseph [name] MRN: [id number] Date of Birth: November 25, 1955  Today's Date: 12/29/2014 PT Individual Time: 1030-1130 PT Individual Time Calculation (min): 60 min   Short Term Goals: Week 1:  PT Short Term Goal 1 (Week 1): Pt will demonstrate transfers with steady A and LRAD PT Short Term Goal 2 (Week 1): Pt will amb 150' with LRAD and steady A PT Short Term Goal 3 (Week 1): Pt will propel w/c x150' with mod I PT Short Term Goal 4 (Week 1): Pt will demonstrate w/c parts management with supervision in 75% of observed attemps PT Short Term Goal 5 (Week 1): Pt will negotiate 4 steps with 1 hand rail  Skilled Therapeutic Interventions/Progress Updates:  Pt was seen bedside in the am. Pt connected to portable O2 at 2 liters. Pt performed all transfers with rolling walker and S with verbal cues. Pt propelled w.c about 100 feet with B UEs and S. Pt ambulated with rolling walker and S for 110 and 150 feet with occasional verbal cues. In gym treatment focused on LE strengthening and conditioning. Pt performed cone taps, criss cross cone taps, and alternating cone taps, 3 sets x 10 reps each. Pt rode Nu step 5 minutes at level 2 with 3 rest breaks. Following treatment pt returned to room and left sitting up in recliner chair with call bell within reach.   Therapy Documentation Precautions:  Precautions Precautions: Fall Precaution Comments: oxygen needs, multiple lines/ leads Restrictions Weight Bearing Restrictions: No General:   Pain: Pain Assessment Pain Assessment: No/denies pain  See Function Navigator for Current Functional Status.   Therapy/Group: Individual Therapy  Dub Amis 12/29/2014, 3:36 PM

## 2014-12-29 NOTE — Progress Notes (Addendum)
Physical Therapy Session Note  Patient Details  Name: Joseph [name] MRN: [id number] Date of Birth: 08-11-1955  Today's Date: 12/29/2014 PT Individual Time: 1445-1530 PT Individual Time Calculation (min): 45 min   Short Term Goals: Week 1:  PT Short Term Goal 1 (Week 1): Pt will demonstrate transfers with steady A and LRAD PT Short Term Goal 2 (Week 1): Pt will amb 150' with LRAD and steady A PT Short Term Goal 3 (Week 1): Pt will propel w/c x150' with mod I PT Short Term Goal 4 (Week 1): Pt will demonstrate w/c parts management with supervision in 75% of observed attemps PT Short Term Goal 5 (Week 1): Pt will negotiate 4 steps with 1 hand rail  Skilled Therapeutic Interventions/Progress Updates:   Session focused on functional mobility, generalized strengthening, and activity tolerance. Patient became tearful multiple times throughout session, stating, "I just want to go home." Reviewed goals, ELOS based on initial evaluation, and team conference planned on Tuesday. Patient asleep in recliner, require max verbal cues for arousal. Stand pivot transfer to wheelchair with close supervision. Patient propelled wheelchair room > gym using BUE with supervision verbal cues for efficient propulsion technique and ambulated using RW x 180 ft including 4 turns with close supervision and assist for 02 tank follow. Patient negotiated up/down 4 stairs x 1 using 2 rails with min A and x 1 attempting to use L rail ascending only, unable to complete without holding onto R rail intermittently and mod A, verbal cues for step-to pattern. Standing therex using RW for BUE support: squats x 10 with max cues for technique and marching x 10 each LE. Patient required prolonged rest breaks with functional mobility. Patient left sitting in recliner with all needs within reach. Patient maintained on 3L 02 via  throughout session.   Therapy Documentation Precautions:  Precautions Precautions: Fall Precaution Comments: oxygen  needs, multiple lines/ leads Restrictions Weight Bearing Restrictions: No Pain: Pain Assessment Pain Assessment: No/denies pain  See Function Navigator for Current Functional Status.   Therapy/Group: Individual Therapy  Laretta Alstrom 12/29/2014, 3:25 PM

## 2014-12-29 NOTE — Progress Notes (Signed)
ANTICOAGULATION CONSULT NOTE - Follow Up Consult  Pharmacy Consult for Coumadin Indication: atrial fibrillation  Allergies  Allergen Reactions  . Keflex [Cephalexin] Other (See Comments)    c-diff reaction  . Dilaudid [Hydromorphone] Nausea And Vomiting    Patient Measurements: Weight: 167 lb 15.9 oz (76.2 kg) Heparin Dosing Weight:   Vital Signs: Temp: 98.8 F (37.1 C) (09/10 0500) Temp Source: Oral (09/10 0500) BP: 102/69 mmHg (09/10 0500) Pulse Rate: 95 (09/10 0500)  Labs:  Recent Labs  12/26/14 1556 12/27/14 0843 12/28/14 0604 12/28/14 1500 12/28/14 1530 12/29/14 0541  HGB 7.5*  --   --  7.2*  --   --   HCT 23.5*  --   --  23.6*  --   --   PLT 207  --   --  233  --   --   LABPROT  --  22.0* 29.4*  --   --  26.9*  INR  --  1.93* 2.85*  --   --  2.53*  CREATININE 5.09*  --   --   --  5.85*  --     Estimated Creatinine Clearance: 14.7 mL/min (by C-G formula based on Cr of 5.85).   Medications:  Scheduled:  . amiodarone  400 mg Oral Daily  . atorvastatin  40 mg Oral QHS  . calcitRIOL  1.75 mcg Oral Q M,W,F-HD  . colchicine  0.6 mg Oral Daily  . [START ON 12/31/2014] darbepoetin (ARANESP) injection - DIALYSIS  150 mcg Intravenous Q Mon-HD  . diltiazem  30 mg Oral 4 times per day  . feeding supplement (NEPRO CARB STEADY)  237 mL Oral BID BM  . fluocinonide ointment   Topical QID  . guaiFENesin  600 mg Oral BID  . heparin  1,000 Units Intracatheter Once  . heparin  1,000 Units Intracatheter Once  . insulin aspart  0-15 Units Subcutaneous TID WC  . insulin glargine  12 Units Subcutaneous QHS  . metoprolol tartrate  50 mg Oral BID  . multivitamin  1 tablet Oral QHS  . nystatin   Topical BID  . phenytoin  300 mg Oral q morning - 10a  . saccharomyces boulardii  250 mg Oral BID  . warfarin  3 mg Oral ONCE-1800  . Warfarin - Pharmacist Dosing Inpatient   Does not apply q1800    Assessment: 59yo male with AFib.  INR dropped to 2.53 after 2 mg last night. Pt  is on Amiodarone and Phenytoin, both of which may increase Coumadin effect. Pt appears to have received an extra dose of Amiodarone on day of transfer to rehab, which may have resulted in the large INR jump observed earlier.    Hgb 7.2, Plt 233. No bleeding noted.   Goal of Therapy:  INR 2-3 Monitor platelets by anticoagulation protocol: Yes   Plan:  Coumadin 2 mg x1  Daily INR Monitor for s/sx of bleeding  Governor Specking, PharmD Clinical Pharmacy Resident Pager: [phone]

## 2014-12-29 NOTE — Progress Notes (Addendum)
Joseph [name] is a 59 y.o. male [birth year] [id number]  Subjective: No new complaints. No new problems. Slept well. Feeling OK.  Objective: Vital signs in last 24 hours: Temp:  [97.1 F (36.2 C)-98.8 F (37.1 C)] 98.8 F (37.1 C) (09/10 0500) Pulse Rate:  [76-106] 95 (09/10 0500) Resp:  [16-20] 20 (09/10 0500) BP: (99-137)/(50-84) 102/69 mmHg (09/10 0500) SpO2:  [96 %-100 %] 100 % (09/10 0500) Weight:  [167 lb 15.9 oz (76.2 kg)] 167 lb 15.9 oz (76.2 kg) (09/10 0500) Weight change: -4 lb 13.6 oz (-2.2 kg) Last BM Date: 12/28/14  Intake/Output from previous day: 09/09 0701 - 09/10 0700 In: 720 [P.O.:720] Out: 3503  Last cbgs: CBG (last 3)   Recent Labs  12/28/14 1201 12/28/14 2141 12/29/14 0714  GLUCAP 240* 118* 123*     Physical Exam General: No apparent distress   HEENT: not dry Lungs: Normal effort. Lungs clear to auscultation, no crackles or wheezes. On O2 Cardiovascular: irregular rate and rhythm, 1-2+ edema B Abdomen: S/NT/ND; BS(+) Musculoskeletal:  unchanged Neurological: No new neurological deficits Wounds: N/A    Skin: clear  Aging changes Mental state: Alert, oriented, cooperative    Lab Results: BMET    Component Value Date/Time   NA 132* 12/28/2014 1530   K 3.3* 12/28/2014 1530   CL 94* 12/28/2014 1530   CO2 25 12/28/2014 1530   GLUCOSE 306* 12/28/2014 1530   BUN 42* 12/28/2014 1530   CREATININE 5.85* 12/28/2014 1530   CREATININE 4.88* 10/17/2013 1027   CALCIUM 8.1* 12/28/2014 1530   GFRNONAA 10* 12/28/2014 1530   GFRAA 11* 12/28/2014 1530   CBC    Component Value Date/Time   WBC 13.1* 12/28/2014 1500   RBC 2.38* 12/28/2014 1500   HGB 7.2* 12/28/2014 1500   HCT 23.6* 12/28/2014 1500   PLT 233 12/28/2014 1500   MCV 99.2 12/28/2014 1500   MCH 30.3 12/28/2014 1500   MCHC 30.5 12/28/2014 1500   RDW 23.2* 12/28/2014 1500   LYMPHSABS 2.1 12/28/2014 1500   MONOABS 1.0 12/28/2014 1500   EOSABS 0.4 12/28/2014 1500   BASOSABS 0.0  12/28/2014 1500    Studies/Results: No results found.  Medications: I have reviewed the patient's current medications.  Assessment/Plan:  1. Functional deficits secondary to debilitation related to pneumonia/sepsis/ multi-medical with limbic encephalitis/seizure disorder 2. DVT Prophylaxis/Anticoagulation: Chronic Coumadin therapy. Serial inr's 3. Pain Management: Tylenol as needed 4. End-stage renal disease. Continue hemodialysis as indicated 5. Neuropsych: This patient is capable of making decisions on his own behalf. 6. Skin/Wound Care: Routine skin checks. Elevate and protect bilateral legs/feet -local care to skin tears -volume/edema mgt per renal 7. Fluids/Electrolytes/Nutrition: good appetite and intake 8. Hypertension/atrial fibrillation. Amiodarone 400 mg daily, Cardizem 30 mg 4 times a day, Lopressor 75 mg twice a day. Monitor with increased mobility 9. Seizure disorder. Dilantin 300 mg daily. Monitor for seizure activity 10. Acute on chronic anemia. hgb 7.5 most recently, aranesp 11. Diabetes: increase lantus to 12u qhs, continue SSI    Length of stay, days: 3  Walker Kehr , MD 12/29/2014, 9:10 AM

## 2014-12-30 ENCOUNTER — Inpatient Hospital Stay (HOSPITAL_COMMUNITY): Payer: 59 | Admitting: Physical Therapy

## 2014-12-30 DIAGNOSIS — R609 Edema, unspecified: Secondary | ICD-10-CM

## 2014-12-30 LAB — PROTIME-INR
INR: 2.6 — ABNORMAL HIGH (ref 0.00–1.49)
Prothrombin Time: 27.5 seconds — ABNORMAL HIGH (ref 11.6–15.2)

## 2014-12-30 LAB — GLUCOSE, CAPILLARY
GLUCOSE-CAPILLARY: 271 mg/dL — AB (ref 65–99)
GLUCOSE-CAPILLARY: 121 mg/dL — AB (ref 65–99)
Glucose-Capillary: 101 mg/dL — ABNORMAL HIGH (ref 65–99)
Glucose-Capillary: 226 mg/dL — ABNORMAL HIGH (ref 65–99)

## 2014-12-30 MED ORDER — WARFARIN SODIUM 2 MG PO TABS
2.0000 mg | ORAL_TABLET | Freq: Once | ORAL | Status: AC
Start: 2014-12-30 — End: 2014-12-30
  Administered 2014-12-30: 2 mg via ORAL
  Filled 2014-12-30: qty 1

## 2014-12-30 MED ORDER — METOPROLOL TARTRATE 50 MG PO TABS
75.0000 mg | ORAL_TABLET | Freq: Two times a day (BID) | ORAL | Status: DC
  Administered 2014-12-30 – 2015-01-05 (×6): 75 mg via ORAL
  Filled 2014-12-30 (×20): qty 1

## 2014-12-30 MED ORDER — DARBEPOETIN ALFA 200 MCG/0.4ML IJ SOSY
200.0000 ug | PREFILLED_SYRINGE | INTRAMUSCULAR | Status: DC
  Administered 2014-12-31: 200 ug via INTRAVENOUS
  Filled 2014-12-30: qty 0.4

## 2014-12-30 NOTE — Progress Notes (Addendum)
Joseph [name] is a 59 y.o. male Aug 12, 1955 [id number]  Subjective: No new complaints. No new problems. Slept well. Feeling OK.  Objective: Vital signs in last 24 hours: Temp:  [98.4 F (36.9 C)-98.9 F (37.2 C)] 98.4 F (36.9 C) (09/11 0515) Pulse Rate:  [80-102] 102 (09/11 0515) Resp:  [18] 18 (09/11 0515) BP: (102-110)/(50-70) 110/64 mmHg (09/11 0515) SpO2:  [98 %-100 %] 100 % (09/11 0515) Weight:  [170 lb 1.6 oz (77.157 kg)] 170 lb 1.6 oz (77.157 kg) (09/11 0515) Weight change: 2 lb 1.8 oz (0.957 kg) Last BM Date: 12/29/14  Intake/Output from previous day: 09/10 0701 - 09/11 0700 In: 480 [P.O.:480] Out: -  Last cbgs: CBG (last 3)   Recent Labs  12/29/14 1637 12/29/14 2057 12/30/14 0706  GLUCAP 164* 266* 101*     Physical Exam General: No apparent distress   HEENT: not dry Lungs: Normal effort. Lungs clear to auscultation, no crackles or wheezes. On O2 Cardiovascular: irregular rate and rhythm Abdomen: S/NT/ND; BS(+) Musculoskeletal:  unchanged Neurological: No new neurological deficits Wounds: N/A    Skin: clear  Aging changes Mental state: Alert, oriented, cooperative LEs with 1-2+ edema    Lab Results: BMET    Component Value Date/Time   NA 132* 12/28/2014 1530   K 3.3* 12/28/2014 1530   CL 94* 12/28/2014 1530   CO2 25 12/28/2014 1530   GLUCOSE 306* 12/28/2014 1530   BUN 42* 12/28/2014 1530   CREATININE 5.85* 12/28/2014 1530   CREATININE 4.88* 10/17/2013 1027   CALCIUM 8.1* 12/28/2014 1530   GFRNONAA 10* 12/28/2014 1530   GFRAA 11* 12/28/2014 1530   CBC    Component Value Date/Time   WBC 13.1* 12/28/2014 1500   RBC 2.38* 12/28/2014 1500   HGB 7.2* 12/28/2014 1500   HCT 23.6* 12/28/2014 1500   PLT 233 12/28/2014 1500   MCV 99.2 12/28/2014 1500   MCH 30.3 12/28/2014 1500   MCHC 30.5 12/28/2014 1500   RDW 23.2* 12/28/2014 1500   LYMPHSABS 2.1 12/28/2014 1500   MONOABS 1.0 12/28/2014 1500   EOSABS 0.4 12/28/2014 1500   BASOSABS 0.0  12/28/2014 1500    Studies/Results: No results found.  Medications: I have reviewed the patient's current medications.  Assessment/Plan:  1. Functional deficits secondary to debilitation related to pneumonia/sepsis/ multi-medical with limbic encephalitis/seizure disorder 2. DVT Prophylaxis/Anticoagulation: Chronic Coumadin therapy. Serial inr's 3. Pain Management: Tylenol as needed 4. End-stage renal disease. Continue hemodialysis as indicated 5. Neuropsych: This patient is capable of making decisions on his own behalf. 6. Skin/Wound Care: Routine skin checks. Elevate and protect bilateral legs/feet -local care to skin tears -volume/edema mgt per renal 7. Fluids/Electrolytes/Nutrition: good appetite and intake 8. Hypertension/atrial fibrillation. Amiodarone 400 mg daily, Cardizem 30 mg 4 times a day, Lopressor 75 mg twice a day. Monitor with increased mobility 9. Seizure disorder. Dilantin 300 mg daily. Monitor for seizure activity 10. Acute on chronic anemia. hgb 7.5 most recently, aranesp 11. Diabetes: increase lantus to 12u qhs, continue SSI 12. Edema. Elevate legs    Length of stay, days: 4  Walker Kehr , MD 12/30/2014, 9:07 AM

## 2014-12-30 NOTE — Progress Notes (Signed)
Physical Therapy Session Note  Patient Details  Name: Joseph [name] MRN: [id number] Date of Birth: 11/03/1955  Today's Date: 12/30/2014 PT Individual Time: 8850-2774 PT Individual Time Calculation (min): 45 min   Short Term Goals: Week 1:  PT Short Term Goal 1 (Week 1): Pt will demonstrate transfers with steady A and LRAD PT Short Term Goal 2 (Week 1): Pt will amb 150' with LRAD and steady A PT Short Term Goal 3 (Week 1): Pt will propel w/c x150' with mod I PT Short Term Goal 4 (Week 1): Pt will demonstrate w/c parts management with supervision in 75% of observed attemps PT Short Term Goal 5 (Week 1): Pt will negotiate 4 steps with 1 hand rail  Skilled Therapeutic Interventions/Progress Updates:  Pt was seen bedside in the am, pt connected to portable O2. Pt performed all transfers during treatment with S and rolling walker. Pt ambulated 110 feet x 2 and 150 feet with rolling walker and S with occasional verbal cues for safety. Pt ambulates with step through gait pattern. In gym treatment focused on LE strengthening and aerobic conditioning. Pt performed alternating cone taps with 10" cones 3 sets x 10 reps each. Pt rode Nu step at level 2 for 80 and 40 seconds and then level 1 for 60 and 100 seconds. Pt c/o increased fatigue today. O2 sat monitored throughout treatment, greater than 90% with activity. Following treatment, pt returned to room. Pt transferred edge of bed to supine with S and no rails with head of bed flat. Pt positioned in bed with feet elevated and head of bed elevated for comfort with call bell within reach.   Therapy Documentation Precautions:  Precautions Precautions: Fall Precaution Comments: oxygen needs, multiple lines/ leads Restrictions Weight Bearing Restrictions: No General:  Pain: No c/o pain.   See Function Navigator for Current Functional Status.   Therapy/Group: Individual Therapy  Dub Amis 12/30/2014, 12:03 PM

## 2014-12-30 NOTE — Progress Notes (Signed)
ANTICOAGULATION CONSULT NOTE - Follow Up Consult  Pharmacy Consult for Coumadin Indication: atrial fibrillation  Allergies  Allergen Reactions  . Keflex [Cephalexin] Other (See Comments)    c-diff reaction  . Dilaudid [Hydromorphone] Nausea And Vomiting    Patient Measurements: Weight: 170 lb 1.6 oz (77.157 kg) Heparin Dosing Weight:   Vital Signs: Temp: 98.4 F (36.9 C) (09/11 0515) Temp Source: Oral (09/11 0515) BP: 110/64 mmHg (09/11 0515) Pulse Rate: 102 (09/11 0515)  Labs:  Recent Labs  12/28/14 0604 12/28/14 1500 12/28/14 1530 12/29/14 0541 12/30/14 0745  HGB  --  7.2*  --   --   --   HCT  --  23.6*  --   --   --   PLT  --  233  --   --   --   LABPROT 29.4*  --   --  26.9* 27.5*  INR 2.85*  --   --  2.53* 2.60*  CREATININE  --   --  5.85*  --   --     Estimated Creatinine Clearance: 14.8 mL/min (by C-G formula based on Cr of 5.85).   Medications:  Scheduled:  . amiodarone  400 mg Oral Daily  . atorvastatin  40 mg Oral QHS  . calcitRIOL  1.75 mcg Oral Q M,W,F-HD  . colchicine  0.6 mg Oral Daily  . [START ON 12/31/2014] darbepoetin (ARANESP) injection - DIALYSIS  200 mcg Intravenous Q Mon-HD  . feeding supplement (NEPRO CARB STEADY)  237 mL Oral BID BM  . fluocinonide ointment   Topical QID  . guaiFENesin  600 mg Oral BID  . heparin  1,000 Units Intracatheter Once  . heparin  1,000 Units Intracatheter Once  . insulin aspart  0-15 Units Subcutaneous TID WC  . insulin glargine  12 Units Subcutaneous QHS  . metoprolol tartrate  75 mg Oral BID  . multivitamin  1 tablet Oral QHS  . nystatin   Topical BID  . phenytoin  300 mg Oral q morning - 10a  . saccharomyces boulardii  250 mg Oral BID  . warfarin  2 mg Oral ONCE-1800  . Warfarin - Pharmacist Dosing Inpatient   Does not apply q1800    Assessment: 59yo male with AFib.  INR 2.6 after 2 mg last night. Pt is on Amiodarone and Phenytoin, both of which may increase Coumadin effect. Pt appears to have  received an extra dose of Amiodarone on day of transfer to rehab, which may have resulted in the large INR jump observed earlier.    Hgb 7.2, Plt 233. No bleeding noted.   Goal of Therapy:  INR 2-3 Monitor platelets by anticoagulation protocol: Yes   Plan:  Coumadin 2 mg x1  Daily INR Monitor for s/sx of bleeding  Governor Specking, PharmD Clinical Pharmacy Resident Pager: [phone]

## 2014-12-30 NOTE — Progress Notes (Addendum)
Subjective:   Therapy going well. No complaints  Objective Filed Vitals:   12/29/14 0500 12/29/14 1257 12/29/14 2210 12/30/14 0515  BP: 102/69 107/70 102/50 110/64  Pulse: 95 80 102 102  Temp: 98.8 F (37.1 C) 98.9 F (37.2 C)  98.4 F (36.9 C)  TempSrc: Oral Oral  Oral  Resp: 20 18  18   Weight: 76.2 kg (167 lb 15.9 oz)   77.157 kg (170 lb 1.6 oz)  SpO2: 100% 98%  100%   Physical Exam General: sitting on side of bed. Alert and oriented, no acute distress  Heart: RRR  Lungs: CTA, unlabored  Abdomen: soft, nontender +BS  Extremities: bilat legs ACE wrapped. Edema improving. Dialysis Access:  L AVG +B/t  Dialysis Orders: [facility] 4 hrs 80kgs 3K/2.5Ca No Heparin L AVG  Profile 2 Micera 100 q 2 weeks- to start 8/24 Calcitriol 1.75  Assessment: 1. Acute encephalopathy/ "limbic" encephalitis- improving. S/p plasma exchanges- last 9/3 2. Sepsis / Cdiff - sp course po vanc , no abx now 3. Anemia - aranesp q Monday- hgb 7.2- aranesp 150q Monday- increase to 200 4. Secondary hyperparathyroidism - ca+ 8.1/phos 3.4 Cont calcitriol.  5. HTN/volume excess - LE edema, continue to lower dry wt. BP limits volume removal.  6. Nutrition - appetite improving. Alb 3.0.renal diet.  7 afib- on coumadin and amiodarone ,MTP and diltiazem 8. Seizure- dilantin 9. DM 10. ESRD - [facility]- HD tomorrow- last K+ 3.3 use 4K bath  [name], NP [place] [phone] 12/30/2014,9:52 AM  LOS: 4 days   Pt seen, examined, agree w assess/plan as above with additions as indicated. Still need better BP's to deal with LE edema. AFib meds are lowering BP.  Decreased MTP to 50 bid but HR is up now. Will resume MTP 75 bid and dc po diltiazem, see if HR remains in range and see if this improves BP's. May need cardiology's input this week if not improved.  Kelly Splinter MD pager [phone]    cell [phone] 12/30/2014, 11:22 AM      Additional Objective Labs: Basic  Metabolic Panel:  Recent Labs Lab 12/25/14 0712 12/26/14 0508 12/26/14 1556 12/28/14 1530  NA 137 134* 131* 132*  K 3.4* 3.5 3.2* 3.3*  CL 100* 97* 94* 94*  CO2 27 27 25 25   GLUCOSE 121* 151* 230* 306*  BUN 15 31* 36* 42*  CREATININE 3.20* 4.56* 5.09* 5.85*  CALCIUM 8.4* 8.5* 8.1* 8.1*  PHOS 3.2 3.8  --  3.4   Liver Function Tests:  Recent Labs Lab 12/25/14 0712 12/26/14 0508 12/28/14 1530  ALBUMIN 3.5 3.2* 3.0*   No results for input(s): LIPASE, AMYLASE in the last 168 hours. CBC:  Recent Labs Lab 12/24/14 1530 12/25/14 0724 12/26/14 1556 12/28/14 1500  WBC 15.9* 13.9* 14.7* 13.1*  NEUTROABS 11.7*  --   --  9.6*  HGB 7.0* 7.6* 7.5* 7.2*  HCT 22.2* 24.4* 23.5* 23.6*  MCV 95.3 96.4 97.1 99.2  PLT 202 206 207 233   Blood Culture    Component Value Date/Time   SDES BLOOD RIGHT HAND 12/18/2014 1215   SPECREQUEST BOTTLES DRAWN AEROBIC AND ANAEROBIC 5CC 12/18/2014 1215   CULT NO GROWTH 5 DAYS 12/18/2014 1215   REPTSTATUS 12/23/2014 FINAL 12/18/2014 1215    Cardiac Enzymes: No results for input(s): CKTOTAL, CKMB, CKMBINDEX, TROPONINI in the last 168 hours. CBG:  Recent Labs Lab 12/29/14 0714 12/29/14 1149 12/29/14 1637 12/29/14 2057 12/30/14 0706  GLUCAP 123* 209* 164*  266* 101*   Iron Studies: No results for input(s): IRON, TIBC, TRANSFERRIN, FERRITIN in the last 72 hours. @lablastinr3 @ Studies/Results: No results found. Medications:   . amiodarone  400 mg Oral Daily  . atorvastatin  40 mg Oral QHS  . calcitRIOL  1.75 mcg Oral Q M,W,F-HD  . colchicine  0.6 mg Oral Daily  . [START ON 12/31/2014] darbepoetin (ARANESP) injection - DIALYSIS  150 mcg Intravenous Q Mon-HD  . diltiazem  30 mg Oral 4 times per day  . feeding supplement (NEPRO CARB STEADY)  237 mL Oral BID BM  . fluocinonide ointment   Topical QID  . guaiFENesin  600 mg Oral BID  . heparin  1,000 Units Intracatheter Once  . heparin  1,000 Units Intracatheter Once  . insulin aspart   0-15 Units Subcutaneous TID WC  . insulin glargine  12 Units Subcutaneous QHS  . metoprolol tartrate  50 mg Oral BID  . multivitamin  1 tablet Oral QHS  . nystatin   Topical BID  . phenytoin  300 mg Oral q morning - 10a  . saccharomyces boulardii  250 mg Oral BID  . Warfarin - Pharmacist Dosing Inpatient   Does not apply [phone]

## 2014-12-31 ENCOUNTER — Inpatient Hospital Stay (HOSPITAL_COMMUNITY): Payer: 59 | Admitting: Speech Pathology

## 2014-12-31 ENCOUNTER — Encounter (HOSPITAL_COMMUNITY): Payer: 59

## 2014-12-31 ENCOUNTER — Inpatient Hospital Stay (HOSPITAL_COMMUNITY): Payer: 59 | Admitting: Occupational Therapy

## 2014-12-31 ENCOUNTER — Inpatient Hospital Stay (HOSPITAL_COMMUNITY): Payer: 59 | Admitting: Physical Therapy

## 2014-12-31 LAB — RENAL FUNCTION PANEL
ANION GAP: 12 (ref 5–15)
Albumin: 2.7 g/dL — ABNORMAL LOW (ref 3.5–5.0)
BUN: 56 mg/dL — ABNORMAL HIGH (ref 6–20)
CALCIUM: 8.1 mg/dL — AB (ref 8.9–10.3)
CHLORIDE: 95 mmol/L — AB (ref 101–111)
CO2: 24 mmol/L (ref 22–32)
CREATININE: 7.13 mg/dL — AB (ref 0.61–1.24)
GFR calc Af Amer: 9 mL/min — ABNORMAL LOW (ref >60)
GFR calc non Af Amer: 7 mL/min — ABNORMAL LOW (ref >60)
GLUCOSE: 250 mg/dL — AB (ref 65–99)
Phosphorus: 3.8 mg/dL (ref 2.5–4.6)
Potassium: 3.5 mmol/L (ref 3.5–5.1)
SODIUM: 131 mmol/L — AB (ref 135–145)

## 2014-12-31 LAB — CBC
HCT: 23.9 % — ABNORMAL LOW (ref 39.0–52.0)
Hemoglobin: 7.7 g/dL — ABNORMAL LOW (ref 13.0–17.0)
MCH: 32.1 pg (ref 26.0–34.0)
MCHC: 32.2 g/dL (ref 30.0–36.0)
MCV: 99.6 fL (ref 78.0–100.0)
PLATELETS: 265 10*3/uL (ref 150–400)
RBC: 2.4 MIL/uL — ABNORMAL LOW (ref 4.22–5.81)
RDW: 23.8 % — ABNORMAL HIGH (ref 11.5–15.5)
WBC: 14.1 10*3/uL — ABNORMAL HIGH (ref 4.0–10.5)

## 2014-12-31 LAB — GLUCOSE, CAPILLARY
GLUCOSE-CAPILLARY: 132 mg/dL — AB (ref 65–99)
GLUCOSE-CAPILLARY: 155 mg/dL — AB (ref 65–99)
Glucose-Capillary: 155 mg/dL — ABNORMAL HIGH (ref 65–99)

## 2014-12-31 LAB — PROTIME-INR
INR: 2.69 — ABNORMAL HIGH (ref 0.00–1.49)
PROTHROMBIN TIME: 28.2 s — AB (ref 11.6–15.2)

## 2014-12-31 MED ORDER — ALTEPLASE 2 MG IJ SOLR
2.0000 mg | Freq: Once | INTRAMUSCULAR | Status: DC | PRN
  Filled 2014-12-31: qty 2

## 2014-12-31 MED ORDER — LIDOCAINE HCL (PF) 1 % IJ SOLN: 5.0000 mL | INTRAMUSCULAR | Status: DC | PRN

## 2014-12-31 MED ORDER — DARBEPOETIN ALFA 200 MCG/0.4ML IJ SOSY
PREFILLED_SYRINGE | INTRAMUSCULAR | Status: AC
  Filled 2014-12-31: qty 0.4

## 2014-12-31 MED ORDER — HEPARIN SODIUM (PORCINE) 1000 UNIT/ML DIALYSIS
1000.0000 [IU] | INTRAMUSCULAR | Status: DC | PRN
  Filled 2014-12-31: qty 1

## 2014-12-31 MED ORDER — PENTAFLUOROPROP-TETRAFLUOROETH EX AERO: 1.0000 "application " | INHALATION_SPRAY | CUTANEOUS | Status: DC | PRN

## 2014-12-31 MED ORDER — SODIUM CHLORIDE 0.9 % IV SOLN: 100.0000 mL | INTRAVENOUS | Status: DC | PRN

## 2014-12-31 MED ORDER — DIPHENHYDRAMINE HCL 25 MG PO CAPS
ORAL_CAPSULE | ORAL | Status: AC
  Administered 2014-12-31: 25 mg via ORAL
  Filled 2014-12-31: qty 1

## 2014-12-31 MED ORDER — LIDOCAINE-PRILOCAINE 2.5-2.5 % EX CREA: 1.0000 "application " | TOPICAL_CREAM | CUTANEOUS | Status: DC | PRN

## 2014-12-31 MED ORDER — WARFARIN SODIUM 2 MG PO TABS
2.0000 mg | ORAL_TABLET | Freq: Once | ORAL | Status: AC
  Administered 2015-01-01: 2 mg via ORAL
  Filled 2014-12-31: qty 1

## 2014-12-31 NOTE — Progress Notes (Signed)
ANTICOAGULATION CONSULT NOTE - Follow Up Consult  Pharmacy Consult for Coumadin Indication: atrial fibrillation  Allergies  Allergen Reactions  . Keflex [Cephalexin] Other (See Comments)    c-diff reaction  . Dilaudid [Hydromorphone] Nausea And Vomiting    Patient Measurements: Weight: 169 lb 8.5 oz (76.9 kg) Heparin Dosing Weight:   Vital Signs: Temp: 97.6 F (36.4 C) (09/12 1520) Temp Source: Oral (09/12 1520) BP: 119/79 mmHg (09/12 1520) Pulse Rate: 92 (09/12 1520)  Labs:  Recent Labs  12/29/14 0541 12/30/14 0745 12/31/14 1840  LABPROT 26.9* 27.5* 28.2*  INR 2.53* 2.60* 2.69*    Estimated Creatinine Clearance: 14.8 mL/min (by C-G formula based on Cr of 5.85).   Medications:  Scheduled:  . amiodarone  400 mg Oral Daily  . atorvastatin  40 mg Oral QHS  . calcitRIOL  1.75 mcg Oral Q M,W,F-HD  . colchicine  0.6 mg Oral Daily  . darbepoetin (ARANESP) injection - DIALYSIS  200 mcg Intravenous Q Mon-HD  . feeding supplement (NEPRO CARB STEADY)  237 mL Oral BID BM  . fluocinonide ointment   Topical QID  . guaiFENesin  600 mg Oral BID  . heparin  1,000 Units Intracatheter Once  . heparin  1,000 Units Intracatheter Once  . insulin aspart  0-15 Units Subcutaneous TID WC  . insulin glargine  12 Units Subcutaneous QHS  . metoprolol tartrate  75 mg Oral BID  . multivitamin  1 tablet Oral QHS  . nystatin   Topical BID  . phenytoin  300 mg Oral q morning - 10a  . saccharomyces boulardii  250 mg Oral BID  . warfarin  2 mg Oral Once  . Warfarin - Pharmacist Dosing Inpatient   Does not apply q1800    Assessment: 59yo male with AFib.  INR 2.69 after 2 mg last night. Pt is on Amiodarone and Phenytoin, both of which may increase Coumadin effect. Pt appears to have received an extra dose of Amiodarone on day of transfer to rehab, which may have resulted in the large INR jump observed earlier.    Hgb 7.2, Plt 233. No bleeding noted.   Goal of Therapy:  INR 2-3 Monitor  platelets by anticoagulation protocol: Yes   Plan:  Coumadin 2 mg x1  Daily INR Monitor for s/sx of bleeding  Levester Fresh, PharmD, BCPS Clinical Pharmacist Pager [phone] 12/31/2014 7:48 PM

## 2014-12-31 NOTE — Progress Notes (Signed)
Physical Therapy Session Note  Patient Details  Name: Joseph [name] MRN: [id number] Date of Birth: 11-07-1955  Today's Date: 12/31/2014 PT Individual Time: 0900-1030 PT Individual Time Calculation (min): 90 min   Short Term Goals: Week 1:  PT Short Term Goal 1 (Week 1): Pt will demonstrate transfers with steady A and LRAD PT Short Term Goal 2 (Week 1): Pt will amb 150' with LRAD and steady A PT Short Term Goal 3 (Week 1): Pt will propel w/c x150' with mod I PT Short Term Goal 4 (Week 1): Pt will demonstrate w/c parts management with supervision in 75% of observed attemps PT Short Term Goal 5 (Week 1): Pt will negotiate 4 steps with 1 hand rail  Skilled Therapeutic Interventions/Progress Updates:    Pt received in recliner and agreeable to therapy session.  Pt performed stand pivot transfer from recliner to w/c with supervision and no AD.  Pt propelled w/c to therapy gym with verbal cues for efficient propulsion with BUEs.  PT instructed patient in NuStep on 1 resistance x48mn 11 secs with BUEs/BLEs before pt required rest break. Pt able to engage in NuStep x2 min and x1:40 using just LEs before requiring rest break.  Pt's O2 sat remained >90% throughout exercise.  PT instructed patient in ambulation x220' with RW and supervision for safety with verbal cues for foot clearance.  PT instructed pt in BLE therex 2x15 reps seated heel/toe raises, seated marching, LAQ, and mini squats and standing hamstring curls (x15 only) on foam with RW.  Pt required short rest break following each exercise.  PT instructed patient in dynamic standing balance activity playing connect four at chest height with single UE support 2 sessions x 5 minutes each of standing.  Focus on crossing midline, reaching, and remembering the object of the game.  Pt ambulated back to room with RW and positioned in recliner with call bell in reach and needs met.    Therapy Documentation Precautions:  Precautions Precautions:  Fall Precaution Comments: oxygen needs, multiple lines/ leads Restrictions Weight Bearing Restrictions: No  Pain: Pain Assessment Pain Assessment: No/denies pain   See Function Navigator for Current Functional Status.   Therapy/Group: Individual Therapy  Alysha Doolan E Penven-Crew 12/31/2014, 11:03 AM

## 2014-12-31 NOTE — Progress Notes (Signed)
Speech Language Pathology Daily Session Note  Patient Details  Name: Joseph [name] MRN: [id number] Date of Birth: 08-26-1955  Today's Date: 12/31/2014 SLP Group Time: 9458-5929 SLP Group Time Calculation (min): 50 min and Today's Date: 12/31/2014 SLP Missed Time: 15 Minutes Missed Time Reason: Toileting (with nurse tech )  Short Term Goals: Week 1: SLP Short Term Goal 1 (Week 1): Patient will demonstrate functional problem solving for mildly complex but familair tasks with Min A multimodal cues.  SLP Short Term Goal 2 (Week 1): Patient will utilize memory compensatory strategies to recall new, daily information with Mod A multimodal cues.  SLP Short Term Goal 3 (Week 1): Patient will self-monitor and correct errors during functional tasks with Min A multimodal cues.   Skilled Therapeutic Interventions: Skilled group treatment session focused on addressing cognitive goals. SLP facilitated session by providing introduction to a new learning task as well as Mod level question cues for recall and  Min verbal cues for functional problem solving during the completion of the task.  Continue with current plan of care.     Function:  Cognition Comprehension Comprehension assist level: Understands basic 75 - 89% of the time/ requires cueing 10 - 24% of the time  Expression   Expression assist level: Expresses basic 90% of the time/requires cueing < 10% of the time.  Social Interaction Social Interaction assist level: Interacts appropriately with others with medication or extra time (anti-anxiety, antidepressant).  Problem Solving Problem solving assist level: Solves basic 75 - 89% of the time/requires cueing 10 - 24% of the time  Memory Memory assist level: Recognizes or recalls 25 - 49% of the time/requires cueing 50 - 75% of the time    Pain Pain Assessment Pain Assessment: No/denies pain  Therapy/Group:  Group Therapy  Carmelia Roller., CCC-SLP [phone]  [name] 12/31/2014, 4:47  PM

## 2014-12-31 NOTE — Progress Notes (Signed)
Occupational Therapy Session Note  Patient Details  Name: Joseph [name] MRN: [id number] Date of Birth: 11-07-1955  Today's Date: 12/31/2014 OT Individual Time: 1400-1500 OT Individual Time Calculation (min): 60 min    Short Term Goals: Week 1:  OT Short Term Goal 1 (Week 1): STGs=LTGS secondary to estimated short LOS  Skilled Therapeutic Interventions/Progress Updates:    Pt seen for OT therapy session focusing on functional activity tolerance and standing balance. Pt in w/c upon arrival, voicing fatigue, however, agreeable to tx session. He self propelled w/c to therapy gym where he completed standing reaching task, required to place horseshoes on overhead basketball rim, requiring to weight shift and cross midline to complete task. He completed with supervision using RW. He then completed task standing on foam mat in order to increase standing balance and for LE/ core strengthening/ stability. Pt tolerated ~1-2 minutes of static standing each trial before requesting seated rest break. He then completed horseshoe toss, requiring anterior/posterior weight shifts. He initially completed in standing without AD and min steadying assist, second trial completed with pt standing on foam mat. He required increased cues for foot placement and standing balance and increased steadying assist. Pt very fearful of falling, VCs provided for deep breathing techniques. Pt returned to room at end of session and requiring min A with RW to transfer to recliner. Pt left sitting in recliner at end of session, all needs in reach.   Pt verbalized many times throughout session regarding his desire to return home and "live life well and exercise and eat like I'm suppose to." Education provided regarding OT goals and POC.   Therapy Documentation Precautions:  Precautions Precautions: Fall Precaution Comments: oxygen needs, multiple lines/ leads Restrictions Weight Bearing Restrictions: No Pain: Pain Assessment Pain  Assessment: No/denies pain  See Function Navigator for Current Functional Status.   Therapy/Group: Individual Therapy  Lewis, Ayjah Show C 12/31/2014, 3:03 PM

## 2014-12-31 NOTE — Progress Notes (Signed)
Assessment: 1. Acute encephalopathy/ "limbic" encephalitis- improving. S/p plasma exchanges- last 9/3 2. Sepsis / Cdiff - sp course po vanc , no abx now 3. Anemia - aranesp q Monday- hgb 7.2- aranesp 150q Monday- increase to 200 4. Secondary hyperparathyroidism - ca+ 8.1/phos 3.4 Cont calcitriol.  5. HTN/volume excess - LE edema, continue to lower dry wt. BP limits volume removal.  6. Nutrition - appetite improving. Alb 3.0.renal diet.  7 afib- on coumadin and amiodarone ,MTP and diltiazem 8. Seizure- dilantin 9. DM 10. ESRD - [facility]WF [facility]- HD today- last K+ 3.3 use 4K bath  Subjective: Interval History: Reports some memory issue  Objective: Vital signs in last 24 hours: Temp:  [98.1 F (36.7 C)-99.1 F (37.3 C)] 98.7 F (37.1 C) (09/12 0517) Pulse Rate:  [77-84] 77 (09/12 0517) Resp:  [18] 18 (09/12 0517) BP: (109-115)/(50-69) 115/69 mmHg (09/12 0517) SpO2:  [100 %] 100 % (09/12 0517) Weight:  [76.9 kg (169 lb 8.5 oz)] 76.9 kg (169 lb 8.5 oz) (09/12 0600) Weight change: -0.257 kg (-9.1 oz)  Intake/Output from previous day: 09/11 0701 - 09/12 0700 In: 360 [P.O.:360] Out: -  Intake/Output this shift: Total I/O In: 120 [P.O.:120] Out: -   General appearance: alert and cooperative Chest wall: no tenderness Cardio: normal apical impulse Extremities: extremities normal, atraumatic, no cyanosis or edema atrophy  Lab Results:  Recent Labs  12/28/14 1500  WBC 13.1*  HGB 7.2*  HCT 23.6*  PLT 233   BMET:  Recent Labs  12/28/14 1530  NA 132*  K 3.3*  CL 94*  CO2 25  GLUCOSE 306*  BUN 42*  CREATININE 5.85*  CALCIUM 8.1*   No results for input(s): PTH in the last 72 hours. Iron Studies: No results for input(s): IRON, TIBC, TRANSFERRIN, FERRITIN in the last 72 hours. Studies/Results: No results found.  Scheduled: . amiodarone  400 mg Oral Daily  . atorvastatin  40 mg Oral QHS  . calcitRIOL  1.75 mcg Oral Q M,W,F-HD  . colchicine  0.6 mg Oral Daily  .  darbepoetin (ARANESP) injection - DIALYSIS  200 mcg Intravenous Q Mon-HD  . feeding supplement (NEPRO CARB STEADY)  237 mL Oral BID BM  . fluocinonide ointment   Topical QID  . guaiFENesin  600 mg Oral BID  . heparin  1,000 Units Intracatheter Once  . heparin  1,000 Units Intracatheter Once  . insulin aspart  0-15 Units Subcutaneous TID WC  . insulin glargine  12 Units Subcutaneous QHS  . metoprolol tartrate  75 mg Oral BID  . multivitamin  1 tablet Oral QHS  . nystatin   Topical BID  . phenytoin  300 mg Oral q morning - 10a  . saccharomyces boulardii  250 mg Oral BID  . Warfarin - Pharmacist Dosing Inpatient   Does not apply q1800     LOS: 5 days   Joseph [name] C 12/31/2014,10:55 AM

## 2014-12-31 NOTE — Progress Notes (Signed)
[place] PHYSICAL MEDICINE & REHABILITATION     PROGRESS NOTE    Subjective/Complaints: Frustrated that he's still is "itching". Anxious to get home  ROS: Pt denies fever, rash/itching, headache, blurred or double vision, nausea, vomiting, abdominal pain, diarrhea, chest pain, shortness of breath, palpitations, dysuria, dizziness,  , bleeding, or depression   Objective: Vital Signs: Blood pressure 115/69, pulse 77, temperature 98.7 F (37.1 C), temperature source Oral, resp. rate 18, weight 76.9 kg (169 lb 8.5 oz), SpO2 100 %. No results found.  Recent Labs  12/28/14 1500  WBC 13.1*  HGB 7.2*  HCT 23.6*  PLT 233    Recent Labs  12/28/14 1530  NA 132*  K 3.3*  CL 94*  GLUCOSE 306*  BUN 42*  CREATININE 5.85*  CALCIUM 8.1*   CBG (last 3)   Recent Labs  12/30/14 1626 12/30/14 2120 12/31/14 0649  GLUCAP 226* 121* 132*    Wt Readings from Last 3 Encounters:  12/31/14 76.9 kg (169 lb 8.5 oz)  12/26/14 79.9 kg (176 lb 2.4 oz)  12/02/14 82.237 kg (181 lb 4.8 oz)    Physical Exam:  Constitutional: He appears well-developed and well-nourished.  HENT: oral mucosa pink/moist Head: Normocephalic and atraumatic.  Eyes: Conjunctivae are normal. Pupils are equal, round, and reactive to light.  Neck: Normal range of motion. Neck supple.  Cardiovascular: An irregular rhythm present. Normal rhythm  Respiratory: Effort normal. Improved breath sounds. He has no wheezes, no rales. He exhibits no tenderness. No dyspnea GI: Soft. Bowel sounds are normal. He exhibits no distension. There is no tenderness.  Musculoskeletal: He exhibits no edema or tenderness.  Neurological: He is alert.  Oriented to self, place, month. Able to follow simple one and two step motor commands. conversationally appropriate. Motor strength is 4 minus/5 in the right deltoid, biceps, triceps, grip Left grip is 4/5, proximally not tested secondary to ongoing hemodialysis Lower extremity strength  4 minus hip flexor knee extensor ankle dorsiflexor plantar flexor Sensation diminished bilateral feet to light touch/pain.  Skin: 1.5" tear  on dorsum of left foot. kerlix wraps on either leg Ext: 1+ edema on feet, legs improved  Assessment/Plan: 1. Functional deficits secondary to debility/encephalopathy which require 3+ hours per day of interdisciplinary therapy in a comprehensive inpatient rehab setting. Physiatrist is providing close team supervision and 24 hour management of active medical problems listed below. Physiatrist and rehab team continue to assess barriers to discharge/monitor patient progress toward functional and medical goals.  Function:  Bathing Bathing position   Position: Standing at sink  Bathing parts Body parts bathed by patient: Right arm, Left arm, Chest, Abdomen, Front perineal area, Right upper leg, Left upper leg, Buttocks Body parts bathed by helper: Back  Bathing assist Assist Level: Touching or steadying assistance(Pt > 75%)      Upper Body Dressing/Undressing Upper body dressing   What is the patient wearing?: Pull over shirt/dress     Pull over shirt/dress - Perfomed by patient: Thread/unthread right sleeve, Thread/unthread left sleeve, Put head through opening, Pull shirt over trunk          Upper body assist Assist Level: Set up      Lower Body Dressing/Undressing Lower body dressing   What is the patient wearing?: Pants, Non-skid slipper socks, Underwear Underwear - Performed by patient: Thread/unthread right underwear leg, Thread/unthread left underwear leg, Pull underwear up/down Underwear - Performed by helper: Thread/unthread right underwear leg, Thread/unthread left underwear leg Pants- Performed by patient: Thread/unthread right pants  leg, Thread/unthread left pants leg, Pull pants up/down Pants- Performed by helper: Thread/unthread right pants leg, Thread/unthread left pants leg   Non-skid slipper socks- Performed by helper:  Don/doff right sock, Don/doff left sock                  Lower body assist Assist Level: Supervision or verbal cues, Touching or steadying assistance (Pt > 75%)      Toileting Toileting   Toileting steps completed by patient: Adjust clothing prior to toileting, Adjust clothing after toileting, Performs perineal hygiene Toileting steps completed by helper: Performs perineal hygiene Toileting Assistive Devices: Grab bar or rail  Toileting assist Assist level: Touching or steadying assistance (Pt.75%), Supervision or verbal cues   Transfers Chair/bed transfer   Chair/bed transfer method: Ambulatory, Stand pivot Chair/bed transfer assist level: Supervision or verbal cues Chair/bed transfer assistive device: Environmental consultant, Air cabin crew     Max distance: 150 Assist level: Supervision or verbal cues   Wheelchair   Type: Manual Max wheelchair distance: 100 Assist Level: Supervision or verbal cues  Cognition Comprehension Comprehension assist level: Understands basic 90% of the time/cues < 10% of the time  Expression Expression assist level: Expresses basic 90% of the time/requires cueing < 10% of the time.  Social Interaction Social Interaction assist level: Interacts appropriately with others with medication or extra time (anti-anxiety, antidepressant).  Problem Solving Problem solving assist level: Solves basic 75 - 89% of the time/requires cueing 10 - 24% of the time  Memory Memory assist level: Recognizes or recalls 25 - 49% of the time/requires cueing 50 - 75% of the time   Medical Problem List and Plan: 1. Functional deficits secondary to debilitation related to pneumonia/sepsis/ multi-medical with limbic encephalitis/seizure disorder 2. DVT Prophylaxis/Anticoagulation: Chronic Coumadin therapy. Serial inr's 3. Pain Management: Tylenol as needed 4. End-stage renal disease. Continue hemodialysis after therapy day 5. Neuropsych: This patient is capable of  making decisions on his own behalf. 6. Skin/Wound Care: Routine skin checks. Elevate and protect bilateral legs/feet  -local care to skin tears  -volume/edema mgt per renal  -benadryl for itching, asked pt to discuss pruritus with nephrology as well 7. Fluids/Electrolytes/Nutrition: good appetite and intake 8. Hypertension/atrial fibrillation. Amiodarone 400 mg daily, Cardizem stopped, Lopressor 75 mg twice a day.  -remains hypotensive, further adjustments per nephrology. ?cards consult 9. Seizure disorder. Dilantin 300 mg daily. Monitor for seizure activity 10. Acute on chronic anemia. hgb 7.5 most recently, aranesp 11. Diabetes: increase lantus to 15u qhs, continue SSI  -may need am lantus   LOS (Days) 5 A FACE TO FACE EVALUATION WAS PERFORMED  SWARTZ,ZACHARY T 12/31/2014 8:14 AM

## 2015-01-01 ENCOUNTER — Inpatient Hospital Stay (HOSPITAL_COMMUNITY): Payer: 59 | Admitting: Speech Pathology

## 2015-01-01 ENCOUNTER — Inpatient Hospital Stay (HOSPITAL_COMMUNITY): Payer: 59 | Admitting: Physical Therapy

## 2015-01-01 ENCOUNTER — Inpatient Hospital Stay (HOSPITAL_COMMUNITY): Payer: 59 | Admitting: Occupational Therapy

## 2015-01-01 LAB — GLUCOSE, CAPILLARY
GLUCOSE-CAPILLARY: 98 mg/dL (ref 65–99)
GLUCOSE-CAPILLARY: 164 mg/dL — AB (ref 65–99)
GLUCOSE-CAPILLARY: 186 mg/dL — AB (ref 65–99)
Glucose-Capillary: 117 mg/dL — ABNORMAL HIGH (ref 65–99)
Glucose-Capillary: 213 mg/dL — ABNORMAL HIGH (ref 65–99)

## 2015-01-01 LAB — PROTIME-INR
INR: 2.47 — AB (ref 0.00–1.49)
Prothrombin Time: 26.5 seconds — ABNORMAL HIGH (ref 11.6–15.2)

## 2015-01-01 MED ORDER — WARFARIN SODIUM 2 MG PO TABS
2.0000 mg | ORAL_TABLET | Freq: Once | ORAL | Status: AC
  Administered 2015-01-01: 2 mg via ORAL
  Filled 2015-01-01: qty 1

## 2015-01-01 NOTE — Patient Care Conference (Signed)
Inpatient RehabilitationTeam Conference and Plan of Care Update Date: 01/01/2015   Time: 2:45 PM    Patient Name: Joseph [name]      Medical Record Number: [id number]  Date of Birth: Nov 23, 1955 Sex: Male         Room/Bed: 4M12C/4M12C-01 Payor Info: Payor: Theme park manager / Plan: Theme park manager OTHER / Product Type: *No Product type* /    Admitting Diagnosis: DEBILITY PNA ENCEPHALITIS  Admit Date/Time:  12/26/2014 12:47 PM Admission Comments: No comment available   Primary Diagnosis:  Debilitated Principal Problem: Debilitated  Patient Active Problem List   Diagnosis Date Noted  . Debilitated 12/26/2014  . Acute encephalopathy   . Autoimmune encephalomyelitis   . Debility   . Atrial fibrillation with RVR   . Encephalopathy acute   . Nausea vomiting and diarrhea 12/10/2014  . Sepsis 12/10/2014  . Pneumonia 12/10/2014  . Encephalitis   . Convulsion   . Recurrent Clostridium difficile diarrhea   . Limbic encephalitis   . Confusion   . Encephalopathy   . Hyperglycemia 11/18/2014  . Delirium 11/18/2014  . DKA, type 2 11/18/2014  . Hyperkalemia 11/18/2014  . Protein-calorie malnutrition, severe 11/09/2014  . Volume overload 11/06/2014  . ESRD on hemodialysis 11/06/2014  . Dyspnea 11/06/2014  . Abnormal LFTs 11/06/2014  . C. difficile diarrhea 11/06/2014  . Supratherapeutic INR   . HCAP (healthcare-associated pneumonia) 11/05/2014  . Rash 10/23/2014  . Gout 05/18/2014  . Erectile dysfunction 05/18/2014  . Pulmonary hypertension   . Diabetes mellitus, controlled   . Chronic diastolic CHF (congestive heart failure) 09/22/2013  . Hyperlipidemia 10/27/2011  . Obstructive sleep apnea 10/27/2011  . Essential hypertension 09/24/2011  . Atrial fibrillation-permanent     Expected Discharge Date: Expected Discharge Date: 01/05/15  Team Members Present: Physician leading conference: Dr. Alger Simons Social Worker Present: Lennart Pall, LCSW Nurse Present: Heather Roberts,  RN PT Present: Carney Living, PT;Caitlin Penven-Crew, PT OT Present: Willeen Cass, OT SLP Present: Weston Anna, SLP PPS Coordinator present : Daiva Nakayama, RN, CRRN     Current Status/Progress Goal Weekly Team Focus  Medical   encephalitis, poor memory, esrd, edema  improve day to day memor  wound care, cognition   Bowel/Bladder   Continent to bowel.ESRD (oliguric).LBM 9/12.  To continue continent to bowel with min. Assisst.  To monitor bowel function Q shift.   Swallow/Nutrition/ Hydration             ADL's   min A to supervision with more than reasonable amt of time  overall supervision with all BADLs  sequencing, task organization, activity tolerance   Mobility   min A/supervision  supervision/mod I  safety awareness, LE strengthening, endurance   Communication             Safety/Cognition/ Behavioral Observations  Mod-Max A   Min-Mod A  recall, problem solving and awareness    Pain   Complain at night time of pain on lower extremities.Tylenol 650 mg. Q 4 PRN.  To keep pain levels less than 3,on scale 1 to 10.  To assess for pain levels Q 2-3 hrs. and PRN.   Skin   Bil lower extrety edema,Left foot with open sore,dressing change PRN.  To keep skin free of infections and breakdown with mod. Assisst.  To monitor skin Q shift and PRN    Rehab Goals Patient on target to meet rehab goals: Yes *See Care Plan and progress notes for long and short-term goals.  Barriers to Discharge: cognition,  stamina, edema    Possible Resolutions to Barriers:  supervision at home, volume mgtwith hd    Discharge Planning/Teaching Needs:  Home with wife to provide 24/7 assistance      Team Discussion:  Had hoped to be able to wean O2 but will need O2 at home.  New blisters on R foot - will consult WOC for care recommendations.  Currently at a supervision level overall with occ light physical assist.  Poor memory, poor vision.  Revisions to Treatment Plan:  None   Continued Need for  Acute Rehabilitation Level of Care: The patient requires daily medical management by a physician with specialized training in physical medicine and rehabilitation for the following conditions: Daily direction of a multidisciplinary physical rehabilitation program to ensure safe treatment while eliciting the highest outcome that is of practical value to the patient.: Yes Daily medical management of patient stability for increased activity during participation in an intensive rehabilitation regime.: Yes Daily analysis of laboratory values and/or radiology reports with any subsequent need for medication adjustment of medical intervention for : Post surgical problems;Neurological problems;Cardiac problems;Other  Mareon Robinette 01/02/2015, 8:53 AM

## 2015-01-01 NOTE — Consult Note (Addendum)
WOC wound consult note Reason for Consult: Consult requested for BLE swelling and possible compression wraps. Wound type: Left foot with partial thickness wound where previous blister has ruptured. Left shin with dark red bruised area. Pt states his previous edema to bilat legs has improved. Measurement: Left shin with intact skin; no open wound or drainage.  2X.3cm dark reddish purple bruise of unknown etiology. Left foot with partial thickness wound; 2.5X3X.1cm, pink and moist, small amt yellow drainage, no odor. Dressing procedure/placement/frequency: Foam dressing to promote healing to left foot.  ABD pad for extra padding to protect shin.  Ace wrap for light compression; to be worn during the day when legs are dependent but can be left off at nighttime when legs are elevated. Discussed plan of care and pt verbalizes understanding. Please re-consult if further assistance is needed.  Thank-you,  Julien Girt MSN, [place], [place], [place], [place]

## 2015-01-01 NOTE — Consult Note (Signed)
NEUROCOGNITIVE TESTING - CONFIDENTIAL [facility]   MEDICAL NECESSITY:  Joseph [name] was seen on the [facility] for neurocognitive testing owing to the patient's diagnosis of encephalopathy.   According to medical records, Mr. [name] was admitted to the rehab unit owing to "Functional deficits secondary to debilitation/encephalopathy." Records also indicate that he/she is a "59 y.o. 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 He was readmitted on 12/09/14 with fever, N/V/D, persistent cough with mild hemoptysis, tachycardia and hypotension due to sepsis from LLL PNA. He was bolused with I liter IV fluids and started on IV Vanc and Zosyn and chronic coumadin changed to IV heparin. Patient with lethargy and MRI brain repeated showing "persistent abnormal flare involving the mesial temporal lobes/hippocampi bilaterally, with question of trace diffusion abnormality within the right hippocampus felt to represent ongoing encephalitis". Patient did receive plasma exchange 5 days since completed her neurology services.Mentation has improved suspect acute encephalopathy but patient remains significantly deconditioned."   During today's visit, [name] reported suffering from 2 days of what is best described as dj vu. These episodes occur several times a day and are frightening to him. He said that when people enter his room, he will often get the sensation that they have already been there and that they had the same conversation. He worries that this will not go away. Otherwise, he denied suffering from any major cognitive issues such as concern over his memory.   From an emotional standpoint, [name] has no history of mental illness or treatment for  depression or anxiety for any reason. He is anxious about being in the hospital, and as mentioned, he is stressed about these dj vu episodes. Suicidal/homicidal ideation, plan or intent was denied. No manic or hypomanic episodes were reported. The patient denied ever experiencing any auditory/visual hallucinations. No major behavioral or personality changes were endorsed.   Patient feels that progress is being made in therapy. No barriers to therapy identified. He has been satisfied with the treatment team. He was adamant about wanting to discharge home as he thinks he would "do better there." His wife and family have been able to provide ample support.   The patient was referred for neuropsychological consultation given the possibility of cognitive sequelae subsequent to the current medical status and in order to assist in treatment planning.   PROCEDURES: [3 units of 96118]  Diagnostic Interview Medical record review Behavioral observations  Neuropsychological testing  Of note, some tasks could not be administered due to certain physical limitations.   TEST RESULTS:   RBANS Indices Scaled Score Percentile Description  Immediate Memory  65 1 Markedly impaired  Visuospatial/Constructional N/A N/A N/A  Language 71 3 Moderately impaired  Attention N/A N/A N/A  Delayed Memory N/A N/A N/A  Total Score N/A N/A N/A    RBANS Subtests Percentile Description  List Learning <1 Markedly impaired  Story Memory 18 Below average  Figure Copy N/A N/A  Line Orientation D/C Markedly impaired  Picture Naming 75 Average  Semantic Fluency <1 Markedly impaired  Digit Span 73 Average  Coding N/A N/A  List Recall <1 Markedly impaired  List Recognition <1 Markedly impaired  Story Recall <1 Markedly impaired  Figure recall N/A N/A   Behavioral Evaluation: [name] was appropriately dressed for season and  situation. Normal posture was noted. He was generally friendly and rapport was easily  established. His speech was as expected and he was able to express ideas effectively. He seemed to understand most test directions readily. His affect was appropriately modulated, though he became sad at one point when talking about wanting to discharge home. Attention and motivation were good. Optimal test taking conditions were maintained.   IMPRESSION: Overall, [name] reported suffering from intermittent bouts of dj vu but no other cognitive issues. However, test results revealed markedly impaired deficits across most cognitive domains and thinking skills assessed during this evaluation with the exception of intact simple naming and simple attention; predominant issues with memory noted. From an emotional standpoint, he is experiencing increased stress/anxious in response to his present medical situation.    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 It is my hope that with amelioration of his medical issues that his cognitive issues will abate to at least a degree, though further follow-up testing is warranted upon discharge. Emotionally, his symptoms fit the criteria for an adjustment disorder that I doubt requires medical treatment and will likely resolve once he discharges home.   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 When interacting with [name], directions and information should be provided in a simple, straight forward manner, and the treatment team should avoid giving multiple instructions simultaneously. He DID NOT benefit from being provided with multiple trials to learn new skills. HOWEVER, he did benefit from an increase in structure and context (i.e., directions given in conversational format as opposed to a random list of  instructions).   . To the extent possible, multitasking should be avoided. Marland Kitchen He requires more time than typical to process information. The treatment team may benefit from waiting for a verbal response to information before presenting additional information.  Marland Kitchen Be aware that he is suffering from visual spatial deficits which could be related to poor eye sight and the fact that he did not have his corrective lenses. Regardless, in the meantime, taking this into consideration will help tailor therapy and keep accidents at [place] as much as possible when he is trying to navigate his surroundings.  . Performance will generally be best in a structured, routine, and familiar environment, as opposed to situations involving complex problems.  . Frequent reorientation will be helpful . Establish consistent daily routines . Use of short treatment sessions . Attend carefully to basic physiologic needs (e.g., nutrition, toileting, sleep, etc.) . Maintain as much as possible a quiet treatment environment . Avoid overstimulation  Recommendations for discharge planning:  . Complete a comprehensive neuropsychological evaluation as an outpatient in 6-8 months to assess for interval change. This can be done through Norton Pastel, PsyD by calling the following number: [phone].  . Establish long-term follow-up care with his care providers.  . Maintain engagement in mentally, physically and cognitively stimulating activities.  . Strive to maintain a healthy lifestyle (e.g., proper diet and exercise) in order to promote physical, cognitive and emotional health.  . Due to the nature and severity of the symptoms noted during this evaluation, it is recommended that he initially obtain constant care and supervision following this hospitalization.  . The patient should refrain from driving at this time.      Rutha Bouchard, Psy.D.  Clinical  Neuropsychologist  Rehabilitation Psychologist

## 2015-01-01 NOTE — Progress Notes (Signed)
Occupational Therapy Session Note  Patient Details  Name: Joseph [name] MRN: [id number] Date of Birth: Jun 02, 1955  Today's Date: 01/01/2015 OT Individual Time: 0900-1000 OT Individual Time Calculation (min): 60 min    Short Term Goals: Week 1:  OT Short Term Goal 1 (Week 1): STGs=LTGS secondary to estimated short LOS  Skilled Therapeutic Interventions/Progress Updates:    1:1 self care retraining at shower level. Focus on sit to stands from different surfaces including recliner, BSC, and shower chair with close supervision to Steady A. Pt required A to maintain forward weight shift at times. Pt able to sequence bathing with min questioning cues. Pt returned to recliner to dress and was able to dress with more than reasonable amt of time and supervision.  Bilateral LE rewrapped to thighs with ace wraps for swelling. (Pt with new blister on right foot). Made reminder sign for Ace wraps to be doffed at night.   Attempted to wean pt from O2 - sats 77% off O2 just sitting and 82% on 2 Liters with threading pants- bumped up to 3 liters.   Therapy Documentation Precautions:  Precautions Precautions: Fall Precaution Comments: oxygen needs, multiple lines/ leads Restrictions Weight Bearing Restrictions: No Pain:  no c/o pain   See Function Navigator for Current Functional Status.   Therapy/Group: Individual Therapy  Willeen Cass [facility] 01/01/2015, 3:20 PM

## 2015-01-01 NOTE — Progress Notes (Signed)
Speech Language Pathology Daily Session Note  Patient Details  Name: Joseph [name] MRN: [id number] Date of Birth: 08-15-1955  Today's Date: 01/01/2015 SLP Individual Time: 0831-0900 SLP Individual Time Calculation (min): 29 min  Short Term Goals: Week 1: SLP Short Term Goal 1 (Week 1): Patient will demonstrate functional problem solving for mildly complex but familair tasks with Min A multimodal cues.  SLP Short Term Goal 2 (Week 1): Patient will utilize memory compensatory strategies to recall new, daily information with Mod A multimodal cues.  SLP Short Term Goal 3 (Week 1): Patient will self-monitor and correct errors during functional tasks with Min A multimodal cues.   Skilled Therapeutic Interventions: Skilled treatment session focused on addressing cognitive goals. SLP facilitated session by providing Max assist question cues for recall of events from yesterday's therapy session.  Patient was able to recall that he went to dialysis last night and as a result reported being tired.  Patient appeared a little irritated that therapy was scheduled because he was wanting to rest; however, was redirectable.  SLP modified daily schedule with large font to allow patient to read it more independently due to reported decreased visual acuity.  Patient able to see number and given the context of it being his therapy schedule he was able to discern the therapy types but required Total assist for names of therapists, though he was able to identify the initial letters of names.  SLP also provided repetition x3 for volume and channel buttons on remote; patient Mod I with call ball.  Continue with current plan of care.    Function:  Cognition Comprehension Comprehension assist level: Understands basic 75 - 89% of the time/ requires cueing 10 - 24% of the time  Expression   Expression assist level: Expresses basic 90% of the time/requires cueing < 10% of the time.  Social Interaction Social Interaction  assist level: Interacts appropriately 50 - 74% of the time - May be physically or verbally inappropriate.  Problem Solving Problem solving assist level: Solves basic 75 - 89% of the time/requires cueing 10 - 24% of the time  Memory Memory assist level: Recognizes or recalls 25 - 49% of the time/requires cueing 50 - 75% of the time    Pain Pain Assessment Pain Assessment: No/denies pain  Therapy/Group: Individual Therapy  Carmelia Roller., CCC-SLP [phone]  [place] 01/01/2015, 10:55 AM

## 2015-01-01 NOTE — Progress Notes (Signed)
Physical Therapy Session Note  Patient Details  Name: Joseph [name] MRN: [id number] Date of Birth: [birth year]  Today's Date: 01/01/2015 PT Individual Time: 1400-1430 and 1600-1630 PT Individual Time Calculation (min): 30 min and 30 min   Short Term Goals: Week 1:  PT Short Term Goal 1 (Week 1): Pt will demonstrate transfers with steady A and LRAD PT Short Term Goal 2 (Week 1): Pt will amb 150' with LRAD and steady A PT Short Term Goal 3 (Week 1): Pt will propel w/c x150' with mod I PT Short Term Goal 4 (Week 1): Pt will demonstrate w/c parts management with supervision in 75% of observed attemps PT Short Term Goal 5 (Week 1): Pt will negotiate 4 steps with 1 hand rail  Skilled Therapeutic Interventions/Progress Updates:    Session 1: Pt received in recliner and agreeable to therapy. Pt states he's had a lot of therapy today and not much time to rest because he has visitors during his breaks.  Pt weaning from O2, on room air at start of session (O2 sat 93%).  Pt amb to therapy gym with RW and close supervision with min verbal cues to locate gym.  O2 sat immediately after ambulating on room air 90%, progressively dropped to 78%, PT discussed with RN and placed patient on 1L of O2 with no recovery, increased to 2L with recovery to 84%.  PT provided pt education on pursed lip breathing and diaphragmatic breathing.  Pt amb back to room with RW and close supervision, O2 sat once sitting 80%.  PT increased O2 to 3L and notified RN who states she will re-check.  Pt left in recliner with call bell in reach and needs met.    Session 2: Pt received in recliner and agreeable to therapy. Pt has no recollection of therapist working with him an hour an a half earlier.  Pt amb to therapy gym with supervision, able to find therapy gym without verbal cues.  PT instructed patient in Nustep at 1 resistance for endurance and strengthening.  Pt able to complete 2 trials of 2 min and 2 trials of 1 min with LE propulsion  only.  Pt amb back to room with RW and supervision and verbal cues for finding his room.  Pt positioned in recliner with call bell in reach and needs met.   Therapy Documentation Precautions:  Precautions Precautions: Fall Precaution Comments: oxygen needs, multiple lines/ leads Restrictions Weight Bearing Restrictions: No  Vital Signs: Therapy Vitals See flowsheet for details Pain: Pain Assessment Pain Assessment: No/denies pain   See Function Navigator for Current Functional Status.   Therapy/Group: Individual Therapy  Earnest Conroy Penven-Crew 01/01/2015, 2:52 PM

## 2015-01-01 NOTE — Progress Notes (Signed)
Speech Language Pathology Daily Session Note  Patient Details  Name: Joseph [name] MRN: [id number] Date of Birth: [birth year]  Today's Date: 01/01/2015 SLP Individual Time: 1000-1055 SLP Individual Time Calculation (min): 55 min  Short Term Goals: Week 1: SLP Short Term Goal 1 (Week 1): Patient will demonstrate functional problem solving for mildly complex but familair tasks with Min A multimodal cues.  SLP Short Term Goal 2 (Week 1): Patient will utilize memory compensatory strategies to recall new, daily information with Mod A multimodal cues.  SLP Short Term Goal 3 (Week 1): Patient will self-monitor and correct errors during functional tasks with Min A multimodal cues.   Skilled Therapeutic Interventions: Skilled treatment session focused on cognitive goals. Upon arrival, patient was lethargic while sitting upright in the wheelchair and initially required Mod A verbal cues for arousal for ~1 minute. However, patient consumed a snack which appeared to improve his overall arousal and alertness. SLP facilitated session by providing Max-Total A multimodal cues for recall of his current medications and their functions as well as reasoning/recall of recent hospitalization. However, patient was oriented to time and place with Mod I. Patient continues to perseverate on going home and continues to require Mod A multimodal cues for anticipatory awareness in regards to d/c planning.    Function:  Eating Eating   Modified Consistency Diet: No Eating Assist Level: No help, No cues           Cognition Comprehension Comprehension assist level: Understands basic 75 - 89% of the time/ requires cueing 10 - 24% of the time  Expression   Expression assist level: Expresses basic 90% of the time/requires cueing < 10% of the time.  Social Interaction Social Interaction assist level: Interacts appropriately 50 - 74% of the time - May be physically or verbally inappropriate.  Problem Solving Problem solving  assist level: Solves basic 75 - 89% of the time/requires cueing 10 - 24% of the time  Memory Memory assist level: Recognizes or recalls 25 - 49% of the time/requires cueing 50 - 75% of the time    Pain No/Denies Pain   Therapy/Group: Individual Therapy  Lorae Roig 01/01/2015, 3:13 PM

## 2015-01-01 NOTE — Progress Notes (Addendum)
Joseph [name] PHYSICAL MEDICINE & REHABILITATION     PROGRESS NOTE    Subjective/Complaints: Remains anxious to go home. Happy that he has a lot of therapy today so that he will be closer to goals.  ROS: Pt denies fever, rash/itching, headache, blurred or double vision, nausea, vomiting, abdominal pain, diarrhea, chest pain, shortness of breath, palpitations, dysuria, dizziness,  , bleeding, or depression   Objective: Vital Signs: Blood pressure 102/75, pulse 88, temperature 98.8 F (37.1 C), temperature source Oral, resp. rate 17, weight 77.1 kg (169 lb 15.6 oz), SpO2 96 %. No results found.  Recent Labs  12/31/14 2138  WBC 14.1*  HGB 7.7*  HCT 23.9*  PLT 265    Recent Labs  12/31/14 2138  NA 131*  K 3.5  CL 95*  GLUCOSE 250*  BUN 56*  CREATININE 7.13*  CALCIUM 8.1*   CBG (last 3)   Recent Labs  12/31/14 1616 01/01/15 0122 01/01/15 0647  GLUCAP 155* 98 117*    Wt Readings from Last 3 Encounters:  01/01/15 77.1 kg (169 lb 15.6 oz)  12/26/14 79.9 kg (176 lb 2.4 oz)  12/02/14 82.237 kg (181 lb 4.8 oz)    Physical Exam:  Constitutional: He appears well-developed and well-nourished.  HENT: oral mucosa pink/moist Head: Normocephalic and atraumatic.  Eyes: Conjunctivae are normal. Pupils are equal, round, and reactive to light.  Neck: Normal range of motion. Neck supple.  Cardiovascular: An irregular rhythm present. Normal rhythm  Respiratory: Effort normal. Improved breath sounds. He has no wheezes, no rales. He exhibits no tenderness. No dyspnea GI: Soft. Bowel sounds are normal. He exhibits no distension. There is no tenderness.  Musculoskeletal: He exhibits no edema or tenderness.  Neurological: He is alert.  Oriented to self, place, month. Able to follow simple one and two step motor commands. conversationally appropriate. Motor strength is 4 minus/5 in the right deltoid, biceps, triceps, grip Left grip is 4/5, proximally not tested secondary to  ongoing hemodialysis Lower extremity strength 4 minus hip flexor knee extensor ankle dorsiflexor plantar flexor Sensation diminished bilateral feet to light touch/pain.  Skin: 1.5" tear  on dorsum of left foot. kerlix wraps on either leg Ext: 1+ edema on feet, legs improved  Assessment/Plan: 1. Functional deficits secondary to debility/encephalopathy which require 3+ hours per day of interdisciplinary therapy in a comprehensive inpatient rehab setting. Physiatrist is providing close team supervision and 24 hour management of active medical problems listed below. Physiatrist and rehab team continue to assess barriers to discharge/monitor patient progress toward functional and medical goals.  Function:  Bathing Bathing position   Position: Standing at sink  Bathing parts Body parts bathed by patient: Right arm, Left arm, Chest, Abdomen, Front perineal area, Right upper leg, Left upper leg, Buttocks Body parts bathed by helper: Back  Bathing assist Assist Level: Touching or steadying assistance(Pt > 75%)      Upper Body Dressing/Undressing Upper body dressing   What is the patient wearing?: Pull over shirt/dress     Pull over shirt/dress - Perfomed by patient: Thread/unthread right sleeve, Thread/unthread left sleeve, Put head through opening, Pull shirt over trunk          Upper body assist Assist Level: Set up      Lower Body Dressing/Undressing Lower body dressing   What is the patient wearing?: Pants, Non-skid slipper socks, Underwear Underwear - Performed by patient: Thread/unthread right underwear leg, Thread/unthread left underwear leg, Pull underwear up/down Underwear - Performed by helper: Thread/unthread right underwear  leg, Thread/unthread left underwear leg Pants- Performed by patient: Thread/unthread right pants leg, Thread/unthread left pants leg, Pull pants up/down Pants- Performed by helper: Thread/unthread right pants leg, Thread/unthread left pants leg    Non-skid slipper socks- Performed by helper: Don/doff right sock, Don/doff left sock                  Lower body assist Assist Level: Supervision or verbal cues, Touching or steadying assistance (Pt > 75%)      Toileting Toileting   Toileting steps completed by patient: Adjust clothing prior to toileting, Adjust clothing after toileting, Performs perineal hygiene Toileting steps completed by helper: Adjust clothing prior to toileting Toileting Assistive Devices: Grab bar or rail  Toileting assist Assist level: Touching or steadying assistance (Pt.75%), Supervision or verbal cues   Transfers Chair/bed transfer   Chair/bed transfer method: Ambulatory Chair/bed transfer assist level: Supervision or verbal cues Chair/bed transfer assistive device: Environmental consultant, Air cabin crew     Max distance: 220 Assist level: Supervision or verbal cues   Wheelchair   Type: Manual Max wheelchair distance: 100 Assist Level: Supervision or verbal cues  Cognition Comprehension Comprehension assist level: Understands basic 75 - 89% of the time/ requires cueing 10 - 24% of the time  Expression Expression assist level: Expresses complex 90% of the time/cues < 10% of the time  Social Interaction Social Interaction assist level: Interacts appropriately 75 - 89% of the time - Needs redirection for appropriate language or to initiate interaction.  Problem Solving Problem solving assist level: Solves basic 75 - 89% of the time/requires cueing 10 - 24% of the time  Memory Memory assist level: Recognizes or recalls 50 - 74% of the time/requires cueing 25 - 49% of the time   Medical Problem List and Plan: 1. Functional deficits secondary to debilitation related to pneumonia/sepsis/ multi-medical with limbic encephalitis/seizure disorder 2. DVT Prophylaxis/Anticoagulation: Chronic Coumadin therapy. Serial inr's 3. Pain Management: Tylenol as needed 4. End-stage renal disease. Continue  hemodialysis in PM after therapy day  5. Neuropsych: This patient is capable of making decisions on his own behalf. 6. Skin/Wound Care: Routine skin checks. Elevate and protect bilateral legs/feet  -local care to skin tears  -volume/edema mgt per renal (limited volume removal d/t bp)  -benadryl for itching 7. Fluids/Electrolytes/Nutrition: good appetite and intake 8. Hypertension/atrial fibrillation. Amiodarone 400 mg daily, Cardizem stopped, Lopressor 75 mg twice a day.  -remains generally hypotensive, further adjustments per nephrology.   9. Seizure disorder. Dilantin 300 mg daily. Monitor for seizure activity 10. Acute on chronic anemia. hgb 7.5 most recently, aranesp 11. Diabetes:  lantus not increased to 15u---maintain at 12u qhs for now as sugars looked better yesterday, continue SSI      LOS (Days) 6 A FACE TO FACE EVALUATION WAS PERFORMED  Joseph [name] T 01/01/2015 8:36 AM

## 2015-01-01 NOTE — Progress Notes (Signed)
ANTICOAGULATION CONSULT NOTE - Follow Up Consult  Pharmacy Consult for coumadin Indication: atrial fibrillation  Allergies  Allergen Reactions  . Keflex [Cephalexin] Other (See Comments)    c-diff reaction  . Dilaudid [Hydromorphone] Nausea And Vomiting    Patient Measurements: Weight: 169 lb 15.6 oz (77.1 kg) Heparin Dosing Weight:   Vital Signs: Temp: 98.8 F (37.1 C) (09/13 0531) Temp Source: Oral (09/13 0531) BP: 91/64 mmHg (09/13 0531) Pulse Rate: 95 (09/13 0531)  Labs:  Recent Labs  12/30/14 0745 12/31/14 1840 12/31/14 2138 01/01/15 0618  HGB  --   --  7.7*  --   HCT  --   --  23.9*  --   PLT  --   --  265  --   LABPROT 27.5* 28.2*  --  26.5*  INR 2.60* 2.69*  --  2.47*  CREATININE  --   --  7.13*  --     Estimated Creatinine Clearance: 12.2 mL/min (by C-G formula based on Cr of 7.13).   Medications:  Scheduled:  . amiodarone  400 mg Oral Daily  . atorvastatin  40 mg Oral QHS  . calcitRIOL  1.75 mcg Oral Q M,W,F-HD  . colchicine  0.6 mg Oral Daily  . darbepoetin (ARANESP) injection - DIALYSIS  200 mcg Intravenous Q Mon-HD  . feeding supplement (NEPRO CARB STEADY)  237 mL Oral BID BM  . fluocinonide ointment   Topical QID  . guaiFENesin  600 mg Oral BID  . heparin  1,000 Units Intracatheter Once  . heparin  1,000 Units Intracatheter Once  . insulin aspart  0-15 Units Subcutaneous TID WC  . insulin glargine  12 Units Subcutaneous QHS  . metoprolol tartrate  75 mg Oral BID  . multivitamin  1 tablet Oral QHS  . nystatin   Topical BID  . phenytoin  300 mg Oral q morning - 10a  . saccharomyces boulardii  250 mg Oral BID  . Warfarin - Pharmacist Dosing Inpatient   Does not apply q1800   Infusions:    Assessment: 59 yo male with afib is currently on therapeutic coumadin.  INR today is 2.47.  Goal of Therapy:  INR 2-3 Monitor platelets by anticoagulation protocol: Yes   Plan:  - coumadin 2 mg po x1 - INR in am  Hurman Ketelsen, Tsz-Yin 01/01/2015,8:22  AM

## 2015-01-01 NOTE — Progress Notes (Signed)
Social Work Patient ID: Joseph [name], male   DOB: 1955-11-01, 59 y.o.   MRN: [id number]   Have reviewed team conf today with pt and wife (via phone).  Both agreeable with targeted d/c date of 9/17 and supervision goals.  Will discuss fam ed needs further with wife tomorrow as she also requests medical update.  Arranging [place] and DME.  Lekeya Rollings, LCSW

## 2015-01-01 NOTE — Progress Notes (Signed)
Assessment: 1. Acute encephalopathy/ "limbic" encephalitis- improving. S/p plasma exchanges- last 9/3 2. Sepsis / Cdiff - sp course po vanc , no abx now 3. HTN/volume excess - LE edema, continue to lower dry wt. BP limits volume removal.  4. afib- on coumadin and amiodarone ,MTP and diltiazem 5. Seizure- dilantin 6. ESRD - MWF [facility]  Subjective: Interval History: HD last PM -2000cc  Objective: Vital signs in last 24 hours: Temp:  [97.6 F (36.4 C)-99 F (37.2 C)] 98.8 F (37.1 C) (09/13 0531) Pulse Rate:  [79-104] 88 (09/13 0826) Resp:  [16-20] 17 (09/13 0531) BP: (84-133)/(59-82) 102/75 mmHg (09/13 0826) SpO2:  [90 %-96 %] 96 % (09/13 0531) Weight:  [77.1 kg (169 lb 15.6 oz)] 77.1 kg (169 lb 15.6 oz) (09/13 0531) Weight change: 0.2 kg (7.1 oz)  Intake/Output from previous day: 09/12 0701 - 09/13 0700 In: 600 [P.O.:600] Out: 2000  Intake/Output this shift: Total I/O In: 240 [P.O.:240] Out: -   General appearance: alert and cooperative Back no edema Legs wrapped Neuro appropriate Weak   Lab Results:  Recent Labs  12/31/14 2138  WBC 14.1*  HGB 7.7*  HCT 23.9*  PLT 265   BMET:  Recent Labs  12/31/14 2138  NA 131*  K 3.5  CL 95*  CO2 24  GLUCOSE 250*  BUN 56*  CREATININE 7.13*  CALCIUM 8.1*   No results for input(s): PTH in the last 72 hours. Iron Studies: No results for input(s): IRON, TIBC, TRANSFERRIN, FERRITIN in the last 72 hours. Studies/Results: No results found.  Scheduled: . amiodarone  400 mg Oral Daily  . atorvastatin  40 mg Oral QHS  . calcitRIOL  1.75 mcg Oral Q M,W,F-HD  . colchicine  0.6 mg Oral Daily  . darbepoetin (ARANESP) injection - DIALYSIS  200 mcg Intravenous Q Mon-HD  . feeding supplement (NEPRO CARB STEADY)  237 mL Oral BID BM  . fluocinonide ointment   Topical QID  . guaiFENesin  600 mg Oral BID  . heparin  1,000 Units Intracatheter Once  . heparin  1,000 Units Intracatheter Once  . insulin aspart  0-15 Units  Subcutaneous TID WC  . insulin glargine  12 Units Subcutaneous QHS  . metoprolol tartrate  75 mg Oral BID  . multivitamin  1 tablet Oral QHS  . nystatin   Topical BID  . phenytoin  300 mg Oral q morning - 10a  . saccharomyces boulardii  250 mg Oral BID  . warfarin  2 mg Oral ONCE-1800  . Warfarin - Pharmacist Dosing Inpatient   Does not apply q1800      LOS: 6 days   Joseph [name] C 01/01/2015,10:46 AM

## 2015-01-02 ENCOUNTER — Inpatient Hospital Stay (HOSPITAL_COMMUNITY): Payer: 59 | Admitting: Physical Therapy

## 2015-01-02 ENCOUNTER — Inpatient Hospital Stay (HOSPITAL_COMMUNITY): Payer: 59 | Admitting: Occupational Therapy

## 2015-01-02 ENCOUNTER — Inpatient Hospital Stay (HOSPITAL_COMMUNITY): Payer: 59 | Admitting: Speech Pathology

## 2015-01-02 LAB — IRON AND TIBC
Iron: 60 ug/dL (ref 45–182)
Saturation Ratios: 53 % — ABNORMAL HIGH (ref 17.9–39.5)
TIBC: 113 ug/dL — ABNORMAL LOW (ref 250–450)
UIBC: 53 ug/dL

## 2015-01-02 LAB — CBC
HCT: 25.3 % — ABNORMAL LOW (ref 39.0–52.0)
Hemoglobin: 7.8 g/dL — ABNORMAL LOW (ref 13.0–17.0)
MCH: 31 pg (ref 26.0–34.0)
MCHC: 30.8 g/dL (ref 30.0–36.0)
MCV: 100.4 fL — ABNORMAL HIGH (ref 78.0–100.0)
Platelets: 250 10*3/uL (ref 150–400)
RBC: 2.52 MIL/uL — ABNORMAL LOW (ref 4.22–5.81)
RDW: 23.6 % — ABNORMAL HIGH (ref 11.5–15.5)
WBC: 14 10*3/uL — ABNORMAL HIGH (ref 4.0–10.5)

## 2015-01-02 LAB — RENAL FUNCTION PANEL
Albumin: 2.8 g/dL — ABNORMAL LOW (ref 3.5–5.0)
Anion gap: 13 (ref 5–15)
BUN: 37 mg/dL — ABNORMAL HIGH (ref 6–20)
CO2: 23 mmol/L (ref 22–32)
Calcium: 8.3 mg/dL — ABNORMAL LOW (ref 8.9–10.3)
Chloride: 96 mmol/L — ABNORMAL LOW (ref 101–111)
Creatinine, Ser: 5.72 mg/dL — ABNORMAL HIGH (ref 0.61–1.24)
GFR calc Af Amer: 11 mL/min — ABNORMAL LOW (ref >60)
GFR calc non Af Amer: 10 mL/min — ABNORMAL LOW (ref >60)
Glucose, Bld: 273 mg/dL — ABNORMAL HIGH (ref 65–99)
Phosphorus: 3.8 mg/dL (ref 2.5–4.6)
Potassium: 3.8 mmol/L (ref 3.5–5.1)
Sodium: 132 mmol/L — ABNORMAL LOW (ref 135–145)

## 2015-01-02 LAB — GLUCOSE, CAPILLARY
GLUCOSE-CAPILLARY: 92 mg/dL (ref 65–99)
GLUCOSE-CAPILLARY: 177 mg/dL — AB (ref 65–99)
GLUCOSE-CAPILLARY: 72 mg/dL (ref 65–99)

## 2015-01-02 LAB — PROTIME-INR
INR: 2.12 — AB (ref 0.00–1.49)
PROTHROMBIN TIME: 23.5 s — AB (ref 11.6–15.2)

## 2015-01-02 MED ORDER — HEPARIN SODIUM (PORCINE) 1000 UNIT/ML DIALYSIS: 1000.0000 [IU] | INTRAMUSCULAR | Status: DC | PRN

## 2015-01-02 MED ORDER — LIDOCAINE-PRILOCAINE 2.5-2.5 % EX CREA: 1.0000 "application " | TOPICAL_CREAM | CUTANEOUS | Status: DC | PRN

## 2015-01-02 MED ORDER — LIDOCAINE HCL (PF) 1 % IJ SOLN: 5.0000 mL | INTRAMUSCULAR | Status: DC | PRN

## 2015-01-02 MED ORDER — SODIUM CHLORIDE 0.9 % IV SOLN: 100.0000 mL | INTRAVENOUS | Status: DC | PRN

## 2015-01-02 MED ORDER — ALTEPLASE 2 MG IJ SOLR: 2.0000 mg | Freq: Once | INTRAMUSCULAR | Status: DC | PRN

## 2015-01-02 MED ORDER — PENTAFLUOROPROP-TETRAFLUOROETH EX AERO: 1.0000 "application " | INHALATION_SPRAY | CUTANEOUS | Status: DC | PRN

## 2015-01-02 MED ORDER — HEPARIN SODIUM (PORCINE) 1000 UNIT/ML DIALYSIS: 20.0000 [IU]/kg | INTRAMUSCULAR | Status: DC | PRN

## 2015-01-02 MED ORDER — WARFARIN SODIUM 3 MG PO TABS
3.0000 mg | ORAL_TABLET | Freq: Once | ORAL | Status: AC
  Administered 2015-01-02: 3 mg via ORAL
  Filled 2015-01-02: qty 1

## 2015-01-02 MED ORDER — PENTAFLUOROPROP-TETRAFLUOROETH EX AERO
1.0000 | INHALATION_SPRAY | CUTANEOUS | Status: DC | PRN
Start: 2015-01-02 — End: 2015-01-03

## 2015-01-02 NOTE — Progress Notes (Signed)
Speech Language Pathology Daily Session Note  Patient Details  Name: Joseph [name] MRN: [id number] Date of Birth: [birth year]  Today's Date: 01/02/2015 SLP Individual Time: 1300-1400 SLP Individual Time Calculation (min): 60 min  Short Term Goals: Week 1: SLP Short Term Goal 1 (Week 1): Patient will demonstrate functional problem solving for mildly complex but familair tasks with Min A multimodal cues.  SLP Short Term Goal 2 (Week 1): Patient will utilize memory compensatory strategies to recall new, daily information with Mod A multimodal cues.  SLP Short Term Goal 3 (Week 1): Patient will self-monitor and correct errors during functional tasks with Min A multimodal cues.   Skilled Therapeutic Interventions: Skilled treatment session focused on cognitive goals. SLP facilitated session by providing Max A multimodal cues for recall of events from previous therapy sessions and for utilization of external memory aids. Patient was independently oriented to place and time but continues to require Max A multimodal cues for orientation to situation. Patient verbalizes frustration with his impaired memory but requires Max encouragement to attempt utilization of memory aids which is impacting his overall ability to compensate and carryover information. Patient and family education is ongoing. Patient left upright in recliner with all needs within reach. Continue with current plan of care.    Function:  Cognition Comprehension Comprehension assist level: Understands basic 75 - 89% of the time/ requires cueing 10 - 24% of the time  Expression   Expression assist level: Expresses complex 90% of the time/cues < 10% of the time  Social Interaction Social Interaction assist level: Interacts appropriately 90% of the time - Needs monitoring or encouragement for participation or interaction.  Problem Solving Problem solving assist level: Solves basic 90% of the time/requires cueing < 10% of the time;Solves basic  75 - 89% of the time/requires cueing 10 - 24% of the time  Memory Memory assist level: Recognizes or recalls 50 - 74% of the time/requires cueing 25 - 49% of the time    Pain Pain Assessment Pain Assessment: No/denies pain Pain Score: 0-No pain  Therapy/Group: Individual Therapy  Teyanna Thielman 01/02/2015, 4:39 PM

## 2015-01-02 NOTE — Progress Notes (Signed)
Physical Therapy Session Note  Patient Details  Name: Joseph [name] MRN: [id number] Date of Birth: 1956/02/03  Today's Date: 01/02/2015 PT Individual Time: 1400-1430 PT Individual Time Calculation (min): 30 min   Short Term Goals: Week 1:  PT Short Term Goal 1 (Week 1): Pt will demonstrate transfers with steady A and LRAD PT Short Term Goal 2 (Week 1): Pt will amb 150' with LRAD and steady A PT Short Term Goal 3 (Week 1): Pt will propel w/c x150' with mod I PT Short Term Goal 4 (Week 1): Pt will demonstrate w/c parts management with supervision in 75% of observed attemps PT Short Term Goal 5 (Week 1): Pt will negotiate 4 steps with 1 hand rail  Skilled Therapeutic Interventions/Progress Updates:    Pt received in recliner and agreeable to therapy.  PT facilitated cognitive tasks recalling afternoon therapy plans (min cues for looking at therapy schedule) and in making phone call to wife.  Rest of PT session focused on community level w/c mobility.  Pt able to walk from recliner to w/c with RW and supervision.  Pt was able to propel w/c 150 ft. or more and is able to turn around, maneuver to table, bed or toilet, maneuver on rugs and over door sills, and can negotiate a 3% grade with supervision and occasional min A for negotiation of 3% grade.  Pt returned to room in w/c total A for time management, positioned in recliner and left with call bell in reach and needs met.    Therapy Documentation Precautions:  Precautions Precautions: Fall Precaution Comments: oxygen needs, multiple lines/ leads Restrictions Weight Bearing Restrictions: No  Pain: Pain Assessment Pain Assessment: No/denies pain   See Function Navigator for Current Functional Status.   Therapy/Group: Individual Therapy  Earnest Conroy Penven-Crew 01/02/2015, 3:47 PM

## 2015-01-02 NOTE — Progress Notes (Signed)
ANTICOAGULATION CONSULT NOTE - Follow Up Consult  Pharmacy Consult for coumadin Indication: atrial fibrillation  Allergies  Allergen Reactions  . Keflex [Cephalexin] Other (See Comments)    c-diff reaction  . Dilaudid [Hydromorphone] Nausea And Vomiting    Patient Measurements: Weight: 173 lb 15.1 oz (78.9 kg) Heparin Dosing Weight:   Vital Signs: Temp: 98.6 F (37 C) (09/14 0500) Temp Source: Oral (09/14 0500) BP: 130/83 mmHg (09/14 0500) Pulse Rate: 88 (09/14 0500)  Labs:  Recent Labs  12/31/14 1840 12/31/14 2138 01/01/15 0618 01/02/15 0539  HGB  --  7.7*  --   --   HCT  --  23.9*  --   --   PLT  --  265  --   --   LABPROT 28.2*  --  26.5* 23.5*  INR 2.69*  --  2.47* 2.12*  CREATININE  --  7.13*  --   --     Estimated Creatinine Clearance: 12.2 mL/min (by C-G formula based on Cr of 7.13).   Medications:  Scheduled:  . amiodarone  400 mg Oral Daily  . atorvastatin  40 mg Oral QHS  . calcitRIOL  1.75 mcg Oral Q M,W,F-HD  . colchicine  0.6 mg Oral Daily  . darbepoetin (ARANESP) injection - DIALYSIS  200 mcg Intravenous Q Mon-HD  . feeding supplement (NEPRO CARB STEADY)  237 mL Oral BID BM  . fluocinonide ointment   Topical QID  . guaiFENesin  600 mg Oral BID  . heparin  1,000 Units Intracatheter Once  . heparin  1,000 Units Intracatheter Once  . insulin aspart  0-15 Units Subcutaneous TID WC  . insulin glargine  12 Units Subcutaneous QHS  . metoprolol tartrate  75 mg Oral BID  . multivitamin  1 tablet Oral QHS  . nystatin   Topical BID  . phenytoin  300 mg Oral q morning - 10a  . saccharomyces boulardii  250 mg Oral BID  . Warfarin - Pharmacist Dosing Inpatient   Does not apply q1800   Infusions:    Assessment: 59 yo male with afib is currently on therapeutic coumadin but INR is down to 2.12 from 2.47. Goal of Therapy:  INR 2-3 Monitor platelets by anticoagulation protocol: Yes   Plan:  - coumadin 3 mg po x1 - INR in am  Joseph [name],  Joseph [name] 01/02/2015,8:15 AM

## 2015-01-02 NOTE — Progress Notes (Signed)
Physical Therapy Session Note  Patient Details  Name: Joseph [name] MRN: [id number] Date of Birth: 1956-04-05  Today's Date: 01/02/2015 PT Individual Time: 0900-1000 PT Individual Time Calculation (min): 60 min   Short Term Goals: Week 1:  PT Short Term Goal 1 (Week 1): Pt will demonstrate transfers with steady A and LRAD PT Short Term Goal 2 (Week 1): Pt will amb 150' with LRAD and steady A PT Short Term Goal 3 (Week 1): Pt will propel w/c x150' with mod I PT Short Term Goal 4 (Week 1): Pt will demonstrate w/c parts management with supervision in 75% of observed attemps PT Short Term Goal 5 (Week 1): Pt will negotiate 4 steps with 1 hand rail  Skilled Therapeutic Interventions/Progress Updates:    Pt received in recliner and agreeable to therapy.  Able to recall morning OT session with mod questioning cues.  Pt performed sit>stand and amb to therapy gym with RW with supervision for safety. Pt able to recall location of gym mod I.  PT instructed patient in BLE therex x15 reps heel/toe raises, LAQ, seated marching, ball squeezes, and hip abduction with tangerine theraband.  Pt negotiated 4 steps with 2 handrails and min A. Pt relied heavily on UEs for support during stair negotiation.  PT instructed patient in biodex limits of stability testing on easy with times to complete test as follows: 1 min, 54 sec, 54 sec.  Pt demonstrates increased difficulty with weight shifting forward, forward/right, and backward/left.  Pt amb back to room with RW after seated rest break with question cues to be able to find his room.  Pt positioned in recliner and left with call bell in reach and needs met.    Therapy Documentation Precautions:  Precautions Precautions: Fall Precaution Comments: oxygen needs, multiple lines/ leads Restrictions Weight Bearing Restrictions: No  Pain: Pain Assessment Pain Assessment: No/denies pain Pain Score: 0-No pain   See Function Navigator for Current Functional  Status.   Therapy/Group: Individual Therapy  Darric Plante E Penven-Crew 01/02/2015, 10:21 AM

## 2015-01-02 NOTE — Progress Notes (Signed)
Occupational Therapy Session Note  Patient Details  Name: Joseph [name] MRN: [id number] Date of Birth: January 21, 1956  Today's Date: 01/02/2015 OT Individual Time: 9371-6967 OT Individual Time Calculation (min): 55 min    Short Term Goals: Week 1:  OT Short Term Goal 1 (Week 1): STGs=LTGS secondary to estimated short LOS  Skilled Therapeutic Interventions/Progress Updates:    Upon entering the room, pt seated in recliner chair sleeping this session. Pt with no c/o pain this session. Pt requiring min verbal questioning cues for orientation this session. Pt unable to recall this therapist whom he has worked with several times. Pt declined shower this session but agreed to bathing and dressing at sink side. Pt required min cues for safety, sequencing, and problem solving this session with self care tasks. Pt engaged in these tasks with 3L O2 via [place] and O2 ranging from 93-97% during tasks. Pt required min A for dynamic standing balance during LB clothing management. Pt also needing assist with anterior weight shift as he tends to posteriorly lean in standing. OT demonstrated and educated pt on pursed lip breathing as well when pt reports fatigue this session. Pt seated in recliner chair with B LEs elevated and wrapped with ACE bandage. Pt remained seated in recliner chair with call bell and all needed items within reach upon exiting the room.    Therapy Documentation Precautions:  Precautions Precautions: Fall Precaution Comments: oxygen needs, multiple lines/ leads Restrictions Weight Bearing Restrictions: No Vital Signs: Therapy Vitals Temp: 98.6 F (37 C) Temp Source: Oral Pulse Rate: 88 Resp: 16 BP: 130/83 mmHg Patient Position (if appropriate): Lying Oxygen Therapy SpO2: 100 % O2 Device: Nasal Cannula O2 Flow Rate (L/min): 2 L/min  See Function Navigator for Current Functional Status.   Therapy/Group: Individual Therapy  [name] 01/02/2015, 8:55 AM

## 2015-01-02 NOTE — Progress Notes (Signed)
[facility] PHYSICAL MEDICINE & REHABILITATION     PROGRESS NOTE    Subjective/Complaints: Had a good night. Pleased that Saturday is his dc date. Pain better. Swelling improved.  ROS: Pt denies fever, rash/itching, headache, blurred or double vision, nausea, vomiting, abdominal pain, diarrhea, chest pain, shortness of breath, palpitations, dysuria, dizziness,  , bleeding, or depression   Objective: Vital Signs: Blood pressure 130/83, pulse 88, temperature 98.6 F (37 C), temperature source Oral, resp. rate 16, weight 78.9 kg (173 lb 15.1 oz), SpO2 100 %. No results found.  Recent Labs  12/31/14 2138  WBC 14.1*  HGB 7.7*  HCT 23.9*  PLT 265    Recent Labs  12/31/14 2138  NA 131*  K 3.5  CL 95*  GLUCOSE 250*  BUN 56*  CREATININE 7.13*  CALCIUM 8.1*   CBG (last 3)   Recent Labs  01/01/15 1650 01/01/15 2102 01/02/15 0637  GLUCAP 164* 186* 92    Wt Readings from Last 3 Encounters:  01/02/15 78.9 kg (173 lb 15.1 oz)  12/26/14 79.9 kg (176 lb 2.4 oz)  12/02/14 82.237 kg (181 lb 4.8 oz)    Physical Exam:  Constitutional: He appears well-developed and well-nourished.  HENT: oral mucosa pink/moist Head: Normocephalic and atraumatic.  Eyes: Conjunctivae are normal. Pupils are equal, round, and reactive to light.  Neck: Normal range of motion. Neck supple.  Cardiovascular: An irregular rhythm present. Normal rhythm  Respiratory: Effort normal. Improved breath sounds. He has no wheezes, no rales. He exhibits no tenderness. No dyspnea GI: Soft. Bowel sounds are normal. He exhibits no distension. There is no tenderness.  Musculoskeletal: He exhibits no edema or tenderness.  Neurological: He is alert.  Oriented to self, place, month. Able to follow simple one and two step motor commands. conversationally appropriate. Motor strength is 4 minus/5 in the right deltoid, biceps, triceps, grip Left grip is 4/5, proximally not tested secondary to ongoing  hemodialysis Lower extremity strength 4 minus hip flexor knee extensor ankle dorsiflexor plantar flexor Sensation diminished bilateral feet to light touch/pain.  Skin: 1.5" tear  on dorsum of left foot. kerlix wraps on either leg Ext: 1+ edema on feet, legs improved  Assessment/Plan: 1. Functional deficits secondary to debility/encephalopathy which require 3+ hours per day of interdisciplinary therapy in a comprehensive inpatient rehab setting. Physiatrist is providing close team supervision and 24 hour management of active medical problems listed below. Physiatrist and rehab team continue to assess barriers to discharge/monitor patient progress toward functional and medical goals.  Function:  Bathing Bathing position   Position: Shower  Bathing parts Body parts bathed by patient: Right arm, Left arm, Chest, Abdomen, Front perineal area, Right upper leg, Left upper leg, Buttocks Body parts bathed by helper: Right lower leg, Left lower leg, Back  Bathing assist Assist Level: Touching or steadying assistance(Pt > 75%)      Upper Body Dressing/Undressing Upper body dressing   What is the patient wearing?: Pull over shirt/dress     Pull over shirt/dress - Perfomed by patient: Thread/unthread right sleeve, Thread/unthread left sleeve, Put head through opening, Pull shirt over trunk          Upper body assist Assist Level: Set up      Lower Body Dressing/Undressing Lower body dressing   What is the patient wearing?: Pants, Non-skid slipper socks, Underwear Underwear - Performed by patient: Thread/unthread right underwear leg, Thread/unthread left underwear leg, Pull underwear up/down Underwear - Performed by helper: Thread/unthread right underwear leg, Thread/unthread left  underwear leg Pants- Performed by patient: Thread/unthread right pants leg, Thread/unthread left pants leg, Pull pants up/down Pants- Performed by helper: Thread/unthread right pants leg, Thread/unthread left  pants leg   Non-skid slipper socks- Performed by helper: Don/doff right sock, Don/doff left sock                  Lower body assist Assist Level: Supervision or verbal cues      Toileting Toileting   Toileting steps completed by patient: Adjust clothing prior to toileting, Adjust clothing after toileting, Performs perineal hygiene Toileting steps completed by helper: Adjust clothing prior to toileting Toileting Assistive Devices: Grab bar or rail  Toileting assist Assist level: Touching or steadying assistance (Pt.75%), Supervision or verbal cues   Transfers Chair/bed transfer   Chair/bed transfer method: Ambulatory Chair/bed transfer assist level: Supervision or verbal cues Chair/bed transfer assistive device: Medical sales representative     Max distance: 220 Assist level: Supervision or verbal cues   Wheelchair   Type: Manual Max wheelchair distance: 100 Assist Level: Supervision or verbal cues  Cognition Comprehension Comprehension assist level: Understands basic 75 - 89% of the time/ requires cueing 10 - 24% of the time  Expression Expression assist level: Expresses complex 90% of the time/cues < 10% of the time  Social Interaction Social Interaction assist level: Interacts appropriately 50 - 74% of the time - May be physically or verbally inappropriate.  Problem Solving Problem solving assist level: Solves basic 90% of the time/requires cueing < 10% of the time, Solves basic 75 - 89% of the time/requires cueing 10 - 24% of the time  Memory Memory assist level: Recognizes or recalls 50 - 74% of the time/requires cueing 25 - 49% of the time   Medical Problem List and Plan: 1. Functional deficits secondary to debilitation related to pneumonia/sepsis/ multi-medical with limbic encephalitis/seizure disorder 2. DVT Prophylaxis/Anticoagulation: Chronic Coumadin therapy. Serial inr's 3. Pain Management: Tylenol as needed 4. End-stage renal disease. Continue  hemodialysis in PM after therapy day  5. Neuropsych: This patient is capable of making decisions on his own behalf. 6. Skin/Wound Care: Routine skin checks. Elevate and protect bilateral legs/feet  -local care to skin tears,  -volume/edema mgt per renal (limited volume removal d/t bp)  -benadryl for itching  -appreciate WOC RN recs---ACE and local care to legs/wounds. 7. Fluids/Electrolytes/Nutrition: good appetite and intake 8. Hypertension/atrial fibrillation. Amiodarone 400 mg daily, Cardizem stopped, Lopressor 75 mg twice a day.  -remains generally hypotensive, further adjustments per nephrology.   9. Seizure disorder. Dilantin 300 mg daily. Monitor for seizure activity 10. Acute on chronic anemia. hgb 7.5 most recently, aranesp 11. Diabetes:  lantus not increased to 15u---maintain at 12u qhs for now as sugars trending down.  - continue SSI      LOS (Days) 7 A FACE TO FACE EVALUATION WAS PERFORMED  Kindred Reidinger T 01/02/2015 8:27 AM

## 2015-01-02 NOTE — Progress Notes (Signed)
  Assessment: 1. Acute encephalopathy/ "limbic" encephalitis- improving. S/p plasma exchanges- last 9/3 2. Sepsis / Cdiff - sp course po vanc , no abx now 3. HTN/volume excess - LE edema,  BP limits volume removal.  4. afib- on coumadin and amiodarone ,MTP and diltiazem 5. Seizure- dilantin 6. ESRD - MWF [place] 7 Anemia-check iron studies.     Subjective: Interval History: Having de javu spells  Objective: Vital signs in last 24 hours: Temp:  [98.2 F (36.8 C)-98.6 F (37 C)] 98.6 F (37 C) (09/14 0500) Pulse Rate:  [88-112] 88 (09/14 0500) Resp:  [16-18] 16 (09/14 0500) BP: (103-130)/(70-83) 130/83 mmHg (09/14 0500) SpO2:  [78 %-100 %] 100 % (09/14 0500) Weight:  [78.9 kg (173 lb 15.1 oz)] 78.9 kg (173 lb 15.1 oz) (09/14 0500) Weight change: 1.8 kg (3 lb 15.5 oz)  Intake/Output from previous day: 09/13 0701 - 09/14 0700 In: 840 [P.O.:840] Out: 1 [Stool:1] Intake/Output this shift:    General appearance: alert and cooperative Resp: clear to auscultation bilaterally Cardio: irregularly irregular rhythm Extremities: ace wraps bilat  Lab Results:  Recent Labs  12/31/14 2138  WBC 14.1*  HGB 7.7*  HCT 23.9*  PLT 265   BMET:  Recent Labs  12/31/14 2138  NA 131*  K 3.5  CL 95*  CO2 24  GLUCOSE 250*  BUN 56*  CREATININE 7.13*  CALCIUM 8.1*   No results for input(s): PTH in the last 72 hours. Iron Studies: No results for input(s): IRON, TIBC, TRANSFERRIN, FERRITIN in the last 72 hours. Studies/Results: No results found.  Scheduled: . amiodarone  400 mg Oral Daily  . atorvastatin  40 mg Oral QHS  . calcitRIOL  1.75 mcg Oral Q M,W,F-HD  . colchicine  0.6 mg Oral Daily  . darbepoetin (ARANESP) injection - DIALYSIS  200 mcg Intravenous Q Mon-HD  . feeding supplement (NEPRO CARB STEADY)  237 mL Oral BID BM  . fluocinonide ointment   Topical QID  . guaiFENesin  600 mg Oral BID  . heparin  1,000 Units Intracatheter Once  . heparin  1,000 Units Intracatheter  Once  . insulin aspart  0-15 Units Subcutaneous TID WC  . insulin glargine  12 Units Subcutaneous QHS  . metoprolol tartrate  75 mg Oral BID  . multivitamin  1 tablet Oral QHS  . nystatin   Topical BID  . phenytoin  300 mg Oral q morning - 10a  . saccharomyces boulardii  250 mg Oral BID  . warfarin  3 mg Oral ONCE-1800  . Warfarin - Pharmacist Dosing Inpatient   Does not apply q1800      LOS: 7 days   Joseph [name] C 01/02/2015,9:54 AM

## 2015-01-03 ENCOUNTER — Inpatient Hospital Stay (HOSPITAL_COMMUNITY): Payer: 59 | Admitting: Physical Therapy

## 2015-01-03 ENCOUNTER — Inpatient Hospital Stay (HOSPITAL_COMMUNITY): Payer: 59 | Admitting: Occupational Therapy

## 2015-01-03 ENCOUNTER — Inpatient Hospital Stay (HOSPITAL_COMMUNITY): Payer: 59 | Admitting: Speech Pathology

## 2015-01-03 LAB — GLUCOSE, CAPILLARY
GLUCOSE-CAPILLARY: 83 mg/dL (ref 65–99)
GLUCOSE-CAPILLARY: 176 mg/dL — AB (ref 65–99)
Glucose-Capillary: 172 mg/dL — ABNORMAL HIGH (ref 65–99)
Glucose-Capillary: 114 mg/dL — ABNORMAL HIGH (ref 65–99)

## 2015-01-03 LAB — PROTIME-INR
INR: 2.22 — ABNORMAL HIGH (ref 0.00–1.49)
Prothrombin Time: 24.4 seconds — ABNORMAL HIGH (ref 11.6–15.2)

## 2015-01-03 MED ORDER — WARFARIN SODIUM 3 MG PO TABS
3.0000 mg | ORAL_TABLET | Freq: Once | ORAL | Status: AC
  Administered 2015-01-03: 3 mg via ORAL
  Filled 2015-01-03: qty 1

## 2015-01-03 NOTE — Progress Notes (Signed)
[facility] PHYSICAL MEDICINE & REHABILITATION     PROGRESS NOTE    Subjective/Complaints: Feels he's improving. Knows that he's going home Saturday. Swelling better. Still having itching  ROS: Pt denies fever, rash/itching, headache, blurred or double vision, nausea, vomiting, abdominal pain, diarrhea, chest pain, shortness of breath, palpitations, dysuria, dizziness,  , bleeding, or depression   Objective: Vital Signs: Blood pressure 104/66, pulse 66, temperature 98.7 F (37.1 C), temperature source Oral, resp. rate 17, weight 77.7 kg (171 lb 4.8 oz), SpO2 100 %. No results found.  Recent Labs  12/31/14 2138 01/02/15 1614  WBC 14.1* 14.0*  HGB 7.7* 7.8*  HCT 23.9* 25.3*  PLT 265 250    Recent Labs  12/31/14 2138 01/02/15 1615  NA 131* 132*  K 3.5 3.8  CL 95* 96*  GLUCOSE 250* 273*  BUN 56* 37*  CREATININE 7.13* 5.72*  CALCIUM 8.1* 8.3*   CBG (last 3)   Recent Labs  01/02/15 1102 01/02/15 2012 01/03/15 0646  GLUCAP 177* 72 83    Wt Readings from Last 3 Encounters:  01/03/15 77.7 kg (171 lb 4.8 oz)  12/26/14 79.9 kg (176 lb 2.4 oz)  12/02/14 82.237 kg (181 lb 4.8 oz)    Physical Exam:  Constitutional: He appears well-developed and well-nourished.  HENT: oral mucosa pink/moist Head: Normocephalic and atraumatic.  Eyes: Conjunctivae are normal. Pupils are equal, round, and reactive to light.  Neck: Normal range of motion. Neck supple.  Cardiovascular: An irregular rhythm present. Normal rhythm  Respiratory: Effort normal. Improved breath sounds. He has no wheezes, no rales. He exhibits no tenderness. No dyspnea GI: Soft. Bowel sounds are normal. He exhibits no distension. There is no tenderness.  Musculoskeletal: He exhibits no edema or tenderness.  Neurological: He is alert.  Oriented to self, place, month. Able to follow simple one and two step motor commands. conversationally appropriate. Motor strength is 4 minus/5 in the right deltoid, biceps,  triceps, grip Left grip is 4/5, proximally not tested secondary to ongoing hemodialysis Lower extremity strength 4 minus hip flexor knee extensor ankle dorsiflexor plantar flexor Sensation diminished bilateral feet to light touch/pain.  Skin: 1.5" tear  on dorsum of left foot. Small blisters/ right foot.  Ext: trace edema on right foot but feet/ legs improved  Assessment/Plan: 1. Functional deficits secondary to debility/encephalopathy which require 3+ hours per day of interdisciplinary therapy in a comprehensive inpatient rehab setting. Physiatrist is providing close team supervision and 24 hour management of active medical problems listed below. Physiatrist and rehab team continue to assess barriers to discharge/monitor patient progress toward functional and medical goals.  Function:  Bathing Bathing position   Position: Wheelchair/chair at sink  Bathing parts Body parts bathed by patient: Right arm, Left arm, Chest, Abdomen, Front perineal area, Right upper leg, Left upper leg, Buttocks Body parts bathed by helper: Right lower leg, Left lower leg, Back  Bathing assist Assist Level: Touching or steadying assistance(Pt > 75%)      Upper Body Dressing/Undressing Upper body dressing   What is the patient wearing?: Pull over shirt/dress     Pull over shirt/dress - Perfomed by patient: Thread/unthread right sleeve, Thread/unthread left sleeve, Put head through opening, Pull shirt over trunk          Upper body assist Assist Level: Set up      Lower Body Dressing/Undressing Lower body dressing   What is the patient wearing?: Pants, Non-skid slipper socks, Underwear Underwear - Performed by patient: Thread/unthread right underwear  leg, Thread/unthread left underwear leg, Pull underwear up/down Underwear - Performed by helper: Thread/unthread right underwear leg, Thread/unthread left underwear leg Pants- Performed by patient: Thread/unthread right pants leg, Thread/unthread left  pants leg, Pull pants up/down, Fasten/unfasten pants Pants- Performed by helper: Thread/unthread right pants leg, Thread/unthread left pants leg   Non-skid slipper socks- Performed by helper: Don/doff right sock, Don/doff left sock                  Lower body assist Assist Level: Supervision or verbal cues      Toileting Toileting   Toileting steps completed by patient: Adjust clothing prior to toileting, Adjust clothing after toileting, Performs perineal hygiene Toileting steps completed by helper: Adjust clothing prior to toileting Toileting Assistive Devices: Grab bar or rail  Toileting assist Assist level: Touching or steadying assistance (Pt.75%), Supervision or verbal cues   Transfers Chair/bed transfer   Chair/bed transfer method: Ambulatory Chair/bed transfer assist level: Supervision or verbal cues Chair/bed transfer assistive device: Medical sales representative     Max distance: 220 Assist level: Supervision or verbal cues   Wheelchair   Type: Manual Max wheelchair distance: >300 Assist Level: Supervision or verbal cues  Cognition Comprehension Comprehension assist level: Understands basic 75 - 89% of the time/ requires cueing 10 - 24% of the time  Expression Expression assist level: Expresses complex 90% of the time/cues < 10% of the time  Social Interaction Social Interaction assist level: Interacts appropriately 90% of the time - Needs monitoring or encouragement for participation or interaction.  Problem Solving Problem solving assist level: Solves basic 90% of the time/requires cueing < 10% of the time, Solves basic 75 - 89% of the time/requires cueing 10 - 24% of the time  Memory Memory assist level: Recognizes or recalls 50 - 74% of the time/requires cueing 25 - 49% of the time   Medical Problem List and Plan: 1. Functional deficits secondary to debilitation related to pneumonia/sepsis/ multi-medical with limbic encephalitis/seizure  disorder  -unable to taper oxygen 2. DVT Prophylaxis/Anticoagulation: Chronic Coumadin therapy. Serial inr's 3. Pain Management: Tylenol as needed 4. End-stage renal disease. Continue hemodialysis in PM after therapy day  5. Neuropsych: This patient is capable of making decisions on his own behalf. 6. Skin/Wound Care: Routine skin checks. Elevate and protect bilateral legs/feet  -local care to skin tears,  -edema improved  -benadryl for itching  -appreciate WOC RN recs---ACE and local care to legs/wounds during day 7. Fluids/Electrolytes/Nutrition: good appetite and intake 8. Hypertension/atrial fibrillation. Amiodarone 400 mg daily, Cardizem stopped, Lopressor 75 mg twice a day.  -remains generally hypotensive, further adjustments per nephrology.   9. Seizure disorder. Dilantin 300 mg daily. Monitor for seizure activity 10. Acute on chronic anemia. hgb 7.5 most recently, aranesp 11. Diabetes:  lantus at 12u qhs for now as sugars trending down.  - continue SSI      LOS (Days) 8 A FACE TO FACE EVALUATION WAS PERFORMED  SWARTZ,ZACHARY T 01/03/2015 8:08 AM

## 2015-01-03 NOTE — Progress Notes (Signed)
ANTICOAGULATION CONSULT NOTE - Follow Up Consult  Pharmacy Consult for coumadin Indication: atrial fibrillation  Allergies  Allergen Reactions  . Keflex [Cephalexin] Other (See Comments)    c-diff reaction  . Dilaudid [Hydromorphone] Nausea And Vomiting    Patient Measurements: Weight: 171 lb 4.8 oz (77.7 kg) Heparin Dosing Weight:   Vital Signs: Temp: 98.7 F (37.1 C) (09/15 0524) Temp Source: Oral (09/15 0524) BP: 104/66 mmHg (09/15 0524) Pulse Rate: 66 (09/15 0524)  Labs:  Recent Labs  12/31/14 1840 12/31/14 2138 01/01/15 0618 01/02/15 0539 01/02/15 1614 01/02/15 1615  HGB  --  7.7*  --   --  7.8*  --   HCT  --  23.9*  --   --  25.3*  --   PLT  --  265  --   --  250  --   LABPROT 28.2*  --  26.5* 23.5*  --   --   INR 2.69*  --  2.47* 2.12*  --   --   CREATININE  --  7.13*  --   --   --  5.72*    Estimated Creatinine Clearance: 15.3 mL/min (by C-G formula based on Cr of 5.72).   Medications:  Scheduled:  . amiodarone  400 mg Oral Daily  . atorvastatin  40 mg Oral QHS  . calcitRIOL  1.75 mcg Oral Q M,W,F-HD  . colchicine  0.6 mg Oral Daily  . darbepoetin (ARANESP) injection - DIALYSIS  200 mcg Intravenous Q Mon-HD  . feeding supplement (NEPRO CARB STEADY)  237 mL Oral BID BM  . fluocinonide ointment   Topical QID  . guaiFENesin  600 mg Oral BID  . heparin  1,000 Units Intracatheter Once  . heparin  1,000 Units Intracatheter Once  . insulin aspart  0-15 Units Subcutaneous TID WC  . insulin glargine  12 Units Subcutaneous QHS  . metoprolol tartrate  75 mg Oral BID  . multivitamin  1 tablet Oral QHS  . nystatin   Topical BID  . phenytoin  300 mg Oral q morning - 10a  . saccharomyces boulardii  250 mg Oral BID  . Warfarin - Pharmacist Dosing Inpatient   Does not apply q1800   Infusions:    Assessment: 59 yo male with afib is currently on therapeutic coumadin.  INR is 2.22.  Goal of Therapy:  INR 2-3 Monitor platelets by anticoagulation protocol:  Yes   Plan:  - coumadin 3 mg po x1 - INR in am  Tarissa Kerin, Tsz-Yin 01/03/2015,8:24 AM

## 2015-01-03 NOTE — Progress Notes (Signed)
Physical Therapy Session Note  Patient Details  Name: Joseph [name] MRN: [id number] Date of Birth: 07-25-1955  Today's Date: 01/03/2015 PT Individual Time: 1330-1415 PT Individual Time Calculation (min): 45 min   Short Term Goals: Week 1:  PT Short Term Goal 1 (Week 1): Pt will demonstrate transfers with steady A and LRAD PT Short Term Goal 2 (Week 1): Pt will amb 150' with LRAD and steady A PT Short Term Goal 3 (Week 1): Pt will propel w/c x150' with mod I PT Short Term Goal 4 (Week 1): Pt will demonstrate w/c parts management with supervision in 75% of observed attemps PT Short Term Goal 5 (Week 1): Pt will negotiate 4 steps with 1 hand rail  Skilled Therapeutic Interventions/Progress Updates:    Pt received in recliner and agreeable to therapy.  Pt anxious today about his slippers not fitting, not being able to feel his feet, and wanting to call his wife before the stores closed so she could get him bigger slippers.  Pt able to be redirected with mod cues.  Per OT, MD trying to wean pt off O2, pt on room air when PT entered and O2 sats ranging from 78-88%.  Pt placed on 1L O2 via nasal cannula and O2 returned to >90%.   Pt performed sit<>stand and amb to therapy gym with RW and supervision with min verbal cues required to locate gym.  PT instructed patient in NuStep at 1 resistance x3 minutes and x 1.5 min for improved strength and activity tolerance.  PT instructed pt in dynamic standing balance activity with Dynavision on Mode A 2x and Mode A endurance 1x.  Pt fatigued about 3/4 of the way through endurance mode but able to complete activity, O2 at end of activity 99% on 1L O2.  Pt amb back to therapy gym x150' with RW and supervision.  PT instructed patient in BLE therex 2x10 with 2# ankle weights LAQ, hip flexion, minisquats, and calf raises.  Pt amb back to room with RW and supervision and positioned in recliner with call bell in reach and needs met.    Therapy Documentation Precautions:   Precautions Precautions: Fall Precaution Comments: oxygen needs, multiple lines/ leads Restrictions Weight Bearing Restrictions: No Pain: Pain Assessment Pain Assessment: No/denies pain   See Function Navigator for Current Functional Status.   Therapy/Group: Individual Therapy  Tytus Strahle E Penven-Crew 01/03/2015, 1:50 PM

## 2015-01-03 NOTE — Progress Notes (Signed)
Attempting to wean patient off oxygen.  Upon PT entering room patients oxygen level was 86% on room air at rest and patient seemed to be slightly symptomatic.  Patients oxygen level dropped to 78% upon standing.  PT placed patient on 1L oxygen via [place] and patients oxygen saturation rose to 99%.  Patient returned to recliner and oxygen decreased to 0.5L and patients saturation is high 90's.  Brita Romp, RN

## 2015-01-03 NOTE — Progress Notes (Signed)
[name] Progress Note  Assessment/Plan: 1. Acute encephalopathy/ "limbic" encephalitis- improving. S/p plasma exchanges- last 9/3 2. Sepsis / Cdiff - sp course po vanc , no abx now 3. HTN/volume excess - LE edema, BP limits volume removal. UF 2 L on Monday 1.5 L on Wed with post weight 77.8 - try for EDW 77  tomorrow 4. afib- on coumadin and amiodarone ,MTP and off diltiazem - NEED TO define who is going to monitor after d/c 5. Seizure- dilantin 6. ESRD - MWF [place]- HD tomorrow - continue 3 K bath after d/c 7 Anemia- 53% sat - no indication for Fe Hgb 7.8 - on max ESA 200 q Monday last given 9/12 - no recent transfusions 8. Nutrition alb 2.8 - renal diet + suppl 9. Metabolic bone disease - iPTH last 427 continue calcitriol - corrected Ca ok 9. Neurocognitive d/o; adjustment d/o with anxious mood - recommendations from evaluation 9/13 need to be forwarded to his HD unit after discharge to share with the staff who participates in his care.  Myriam Jacobson, PA-C [place] 01/03/2015,10:23 AM  LOS: 8 days    Renal Attending: I agree with the evaluation and plan as articulated above.  Pt remains anxious and worried about his cognitive issues. Millie Forde C   Subjective:   Going home Sat. C/o pain from 3 unsuccessful attempts at blood draw.  Objective Filed Vitals:   01/02/15 1900 01/02/15 1942 01/02/15 2015 01/03/15 0524  BP: 107/86 138/98 129/84 104/66  Pulse: 78 98 105 66  Temp:  98.4 F (36.9 C) 98.5 F (36.9 C) 98.7 F (37.1 C)  TempSrc:  Oral Oral Oral  Resp:  16 17 17   Weight:  77.8 kg (171 lb 8.3 oz)  77.7 kg (171 lb 4.8 oz)  SpO2:  99% 99% 100%   Physical Exam General: NAD Heart: irreg irreg Lungs: no rales Abdomen: soft  Extremities: LE wrapped with ACE - toes puffy Dialysis Access: left AVF + bruit  Dialysis Orders: [facility] 4 hrs 80kgs 3K/2.5Ca No Heparin Profile 2 Micera 100 q 2 weeks- to start  8/24 Calcitriol 1.75  Additional Objective Labs: Basic Metabolic Panel:  Recent Labs Lab 12/28/14 1530 12/31/14 2138 01/02/15 1615  NA 132* 131* 132*  K 3.3* 3.5 3.8  CL 94* 95* 96*  CO2 25 24 23   GLUCOSE 306* 250* 273*  BUN 42* 56* 37*  CREATININE 5.85* 7.13* 5.72*  CALCIUM 8.1* 8.1* 8.3*  PHOS 3.4 3.8 3.8   Liver Function Tests:  Recent Labs Lab 12/28/14 1530 12/31/14 2138 01/02/15 1615  ALBUMIN 3.0* 2.7* 2.8*   CBC:  Recent Labs Lab 12/28/14 1500 12/31/14 2138 01/02/15 1614  WBC 13.1* 14.1* 14.0*  NEUTROABS 9.6*  --   --   HGB 7.2* 7.7* 7.8*  HCT 23.6* 23.9* 25.3*  MCV 99.2 99.6 100.4*  PLT 233 265 250   Blood Culture    Component Value Date/Time   SDES BLOOD RIGHT HAND 12/18/2014 1215   SPECREQUEST BOTTLES DRAWN AEROBIC AND ANAEROBIC 5CC 12/18/2014 1215   CULT NO GROWTH 5 DAYS 12/18/2014 1215   REPTSTATUS 12/23/2014 FINAL 12/18/2014 1215   CBG:  Recent Labs Lab 01/01/15 2102 01/02/15 0637 01/02/15 1102 01/02/15 2012 01/03/15 0646  GLUCAP 186* 92 177* 72 83   Iron Studies:  Recent Labs  01/02/15 1614  IRON 60  TIBC 113*   Medications:   . amiodarone  400 mg Oral Daily  . atorvastatin  40 mg  Oral QHS  . calcitRIOL  1.75 mcg Oral Q M,W,F-HD  . colchicine  0.6 mg Oral Daily  . darbepoetin (ARANESP) injection - DIALYSIS  200 mcg Intravenous Q Mon-HD  . feeding supplement (NEPRO CARB STEADY)  237 mL Oral BID BM  . fluocinonide ointment   Topical QID  . guaiFENesin  600 mg Oral BID  . heparin  1,000 Units Intracatheter Once  . heparin  1,000 Units Intracatheter Once  . insulin aspart  0-15 Units Subcutaneous TID WC  . insulin glargine  12 Units Subcutaneous QHS  . metoprolol tartrate  75 mg Oral BID  . multivitamin  1 tablet Oral QHS  . nystatin   Topical BID  . phenytoin  300 mg Oral q morning - 10a  . saccharomyces boulardii  250 mg Oral BID  . Warfarin - Pharmacist Dosing Inpatient   Does not apply [phone]

## 2015-01-03 NOTE — Progress Notes (Signed)
Occupational Therapy Session Note  Patient Details  Name: ADITYA NASTASI MRN: [id number] Date of Birth: [birth year]  Today's Date: 01/03/2015 OT Individual Time: 2641-5830 and 1300-1330 OT Individual Time Calculation (min): 57 min and 30 min   Short Term Goals: Week 1:  OT Short Term Goal 1 (Week 1): STGs=LTGS secondary to estimated short LOS  Skilled Therapeutic Interventions/Progress Updates:  Session 1: Upon entering the room, pt seated in recliner chair with no c/o pain this session. Pt engaged in skilled OT intervention for self care retraining, standing balance, functional transfer, and pursed lip breathing. Pt refused shower this session. Pt engaged in bathing and dressing with STS from sink side. Pt requiring min verbal cues for safety awareness, initiation,  and for energy conservation this session. Pt requiring multiple rest breaks throughout session secondary to fatigue. Pt returned to recliner chair where B LEs had dressings and ACE wrap applied. B LEs elevated as well. Call bell and all needed items within reach upon exiting the room.   Session 2: Upon entering the room, pt seated in recliner with no c/o pain this session. Pt engaged in session on room air with multiple vitals performed throughout. Pt ambulated 150' to ADL apartment with use of RW and supervision for safety. Pt requiring seated rest break while therapist educated and  demonstrated simulated walk in shower transfer with use of RW and shower chair. Pt returned demonstration with min A for balance with min verbal cues for proper technique and safety. Pt ambulated back to room with O2 decreased to 88% sat. Pt engaged in 30 seconds of pursed lip breathing and O2 sat returned to 95%. O2 left off of pt with RN made aware. Call bell and all needed items within reach upon exiting the room.   Therapy Documentation Precautions:  Precautions Precautions: Fall Precaution Comments: oxygen needs, multiple lines/  leads Restrictions Weight Bearing Restrictions: No Pain: Pain Assessment Pain Assessment: No/denies pain  See Function Navigator for Current Functional Status.   Therapy/Group: Individual Therapy  [name] 01/03/2015, 11:15 AM

## 2015-01-03 NOTE — Progress Notes (Signed)
Speech Language Pathology Daily Session Note  Patient Details  Name: Joseph [name] MRN: [id number] Date of Birth: 18-Nov-1955  Today's Date: 01/03/2015 SLP Individual Time: 1115-1200 SLP Individual Time Calculation (min): 45 min  Short Term Goals:  Week 1: SLP Short Term Goal 1 (Week 1): Patient will demonstrate functional problem solving for mildly complex but familair tasks with Min A multimodal cues.  SLP Short Term Goal 2 (Week 1): Patient will utilize memory compensatory strategies to recall new, daily information with Mod A multimodal cues.  SLP Short Term Goal 3 (Week 1): Patient will self-monitor and correct errors during functional tasks with Min A multimodal cues.   Skilled Therapeutic Interventions: Skilled treatment session focused on cognitive goals. SLP facilitated session by providing Min A verbal cues for recall of events from previous therapy session and supervision verbal cues to follow multi-step directions, recall of those directions and divided attention with task and a functional conversation for ~10 minutes. Patient was independently oriented X 4 and required Mod A verbal and question cues for recall of novel information after a 5 minute delay. Patient was left upright in recliner with all needs within reach.    Function:  Cognition Comprehension Comprehension assist level: Understands basic 75 - 89% of the time/ requires cueing 10 - 24% of the time  Expression   Expression assist level: Expresses complex 90% of the time/cues < 10% of the time  Social Interaction Social Interaction assist level: Interacts appropriately 90% of the time - Needs monitoring or encouragement for participation or interaction.  Problem Solving Problem solving assist level: Solves basic 75 - 89% of the time/requires cueing 10 - 24% of the time  Memory Memory assist level: Recognizes or recalls 50 - 74% of the time/requires cueing 25 - 49% of the time    Pain Pain Assessment Pain Assessment:  No/denies pain  Therapy/Group: Individual Therapy  [name] 01/03/2015, 2:08 PM

## 2015-01-04 ENCOUNTER — Inpatient Hospital Stay (HOSPITAL_COMMUNITY): Payer: 59 | Admitting: Speech Pathology

## 2015-01-04 ENCOUNTER — Encounter (HOSPITAL_COMMUNITY): Payer: 59 | Admitting: Occupational Therapy

## 2015-01-04 ENCOUNTER — Inpatient Hospital Stay (HOSPITAL_COMMUNITY): Payer: 59 | Admitting: Occupational Therapy

## 2015-01-04 ENCOUNTER — Inpatient Hospital Stay (HOSPITAL_COMMUNITY): Payer: 59 | Admitting: Physical Therapy

## 2015-01-04 LAB — CBC
HCT: 26.4 % — ABNORMAL LOW (ref 39.0–52.0)
Hemoglobin: 8.1 g/dL — ABNORMAL LOW (ref 13.0–17.0)
MCH: 31.3 pg (ref 26.0–34.0)
MCHC: 30.7 g/dL (ref 30.0–36.0)
MCV: 101.9 fL — ABNORMAL HIGH (ref 78.0–100.0)
Platelets: 245 10*3/uL (ref 150–400)
RBC: 2.59 MIL/uL — ABNORMAL LOW (ref 4.22–5.81)
RDW: 23.9 % — ABNORMAL HIGH (ref 11.5–15.5)
WBC: 13.4 10*3/uL — ABNORMAL HIGH (ref 4.0–10.5)

## 2015-01-04 LAB — GLUCOSE, CAPILLARY
GLUCOSE-CAPILLARY: 45 mg/dL — AB (ref 65–99)
GLUCOSE-CAPILLARY: 45 mg/dL — AB (ref 65–99)
GLUCOSE-CAPILLARY: 49 mg/dL — AB (ref 65–99)
Glucose-Capillary: 509 mg/dL — ABNORMAL HIGH (ref 65–99)
Glucose-Capillary: 106 mg/dL — ABNORMAL HIGH (ref 65–99)
Glucose-Capillary: 247 mg/dL — ABNORMAL HIGH (ref 65–99)
Glucose-Capillary: 120 mg/dL — ABNORMAL HIGH (ref 65–99)

## 2015-01-04 LAB — RENAL FUNCTION PANEL
Albumin: 2.7 g/dL — ABNORMAL LOW (ref 3.5–5.0)
Anion gap: 13 (ref 5–15)
BUN: 32 mg/dL — ABNORMAL HIGH (ref 6–20)
CO2: 24 mmol/L (ref 22–32)
Calcium: 8.4 mg/dL — ABNORMAL LOW (ref 8.9–10.3)
Chloride: 97 mmol/L — ABNORMAL LOW (ref 101–111)
Creatinine, Ser: 5.58 mg/dL — ABNORMAL HIGH (ref 0.61–1.24)
GFR calc Af Amer: 12 mL/min — ABNORMAL LOW (ref >60)
GFR calc non Af Amer: 10 mL/min — ABNORMAL LOW (ref >60)
Glucose, Bld: 180 mg/dL — ABNORMAL HIGH (ref 65–99)
Phosphorus: 3.8 mg/dL (ref 2.5–4.6)
Potassium: 3.8 mmol/L (ref 3.5–5.1)
Sodium: 134 mmol/L — ABNORMAL LOW (ref 135–145)

## 2015-01-04 LAB — PROTIME-INR
INR: 2.34 — AB (ref 0.00–1.49)
Prothrombin Time: 25.4 seconds — ABNORMAL HIGH (ref 11.6–15.2)

## 2015-01-04 MED ORDER — LIDOCAINE HCL (PF) 1 % IJ SOLN: 5.0000 mL | INTRAMUSCULAR | Status: DC | PRN

## 2015-01-04 MED ORDER — AMIODARONE HCL 400 MG PO TABS: 400.0000 mg | ORAL_TABLET | Freq: Every day | ORAL | Status: DC

## 2015-01-04 MED ORDER — LIDOCAINE-PRILOCAINE 2.5-2.5 % EX CREA: 1.0000 "application " | TOPICAL_CREAM | CUTANEOUS | Status: DC | PRN

## 2015-01-04 MED ORDER — NAPHAZOLINE-PHENIRAMINE 0.025-0.3 % OP SOLN: 2.0000 [drp] | Freq: Four times a day (QID) | OPHTHALMIC | Status: DC | PRN

## 2015-01-04 MED ORDER — HEPARIN SODIUM (PORCINE) 1000 UNIT/ML DIALYSIS: 1000.0000 [IU] | INTRAMUSCULAR | Status: DC | PRN

## 2015-01-04 MED ORDER — INSULIN GLARGINE 100 UNIT/ML ~~LOC~~ SOLN: 12.0000 [IU] | Freq: Every day | SUBCUTANEOUS | Status: DC

## 2015-01-04 MED ORDER — BENZONATATE 200 MG PO CAPS: 200.0000 mg | ORAL_CAPSULE | Freq: Three times a day (TID) | ORAL | Status: DC | PRN

## 2015-01-04 MED ORDER — METOPROLOL TARTRATE 75 MG PO TABS: 75.0000 mg | ORAL_TABLET | Freq: Two times a day (BID) | ORAL | Status: DC

## 2015-01-04 MED ORDER — ATORVASTATIN CALCIUM 40 MG PO TABS: 40.0000 mg | ORAL_TABLET | Freq: Every day | ORAL | Status: AC

## 2015-01-04 MED ORDER — WARFARIN SODIUM 3 MG PO TABS: 3.0000 mg | ORAL_TABLET | Freq: Every day | ORAL | Status: DC

## 2015-01-04 MED ORDER — SACCHAROMYCES BOULARDII 250 MG PO CAPS: 250.0000 mg | ORAL_CAPSULE | Freq: Two times a day (BID) | ORAL | Status: DC

## 2015-01-04 MED ORDER — SODIUM CHLORIDE 0.9 % IV SOLN: 100.0000 mL | INTRAVENOUS | Status: DC | PRN

## 2015-01-04 MED ORDER — COLCHICINE 0.6 MG PO TABS: 0.6000 mg | ORAL_TABLET | Freq: Every day | ORAL | Status: DC

## 2015-01-04 MED ORDER — WARFARIN SODIUM 3 MG PO TABS
3.0000 mg | ORAL_TABLET | Freq: Every day | ORAL | Status: DC
  Administered 2015-01-04: 3 mg via ORAL
  Filled 2015-01-04 (×2): qty 1

## 2015-01-04 MED ORDER — RENA-VITE PO TABS: 1.0000 | ORAL_TABLET | Freq: Every day | ORAL | Status: DC

## 2015-01-04 MED ORDER — DIPHENHYDRAMINE HCL 25 MG PO CAPS
ORAL_CAPSULE | ORAL | Status: AC
  Filled 2015-01-04: qty 1

## 2015-01-04 MED ORDER — ALTEPLASE 2 MG IJ SOLR: 2.0000 mg | Freq: Once | INTRAMUSCULAR | Status: DC | PRN

## 2015-01-04 MED ORDER — PHENYTOIN SODIUM EXTENDED 300 MG PO CAPS: 300.0000 mg | ORAL_CAPSULE | Freq: Every day | ORAL | Status: DC

## 2015-01-04 MED ORDER — PENTAFLUOROPROP-TETRAFLUOROETH EX AERO: 1.0000 "application " | INHALATION_SPRAY | CUTANEOUS | Status: DC | PRN

## 2015-01-04 NOTE — Progress Notes (Signed)
SATURATION QUALIFICATIONS: (This note is used to comply with regulatory documentation for home oxygen)  Patient Saturations on Room Air at Rest = 86%  Patient Saturations on Room Air while Ambulating = 78%  Patient Saturations on 1-2  Liters of oxygen while Ambulating = 94%  Please briefly explain why patient needs home oxygen:pt needs home O2 due to desaturation upon standing and ambulating.

## 2015-01-04 NOTE — Progress Notes (Signed)
[place] PHYSICAL MEDICINE & REHABILITATION     PROGRESS NOTE    Subjective/Complaints: Sugars low this am. A little shaky initially---feeling fine now. RN working on getting sugar back up.  ROS: Pt denies fever, rash/itching, headache, blurred or double vision, nausea, vomiting, abdominal pain, diarrhea, chest pain, shortness of breath, palpitations, dysuria, dizziness,  , bleeding, or depression   Objective: Vital Signs: Blood pressure 131/87, pulse 127, temperature 98.3 F (36.8 C), temperature source Oral, resp. rate 16, weight 78.563 kg (173 lb 3.2 oz), SpO2 97 %. No results found.  Recent Labs  01/02/15 1614  WBC 14.0*  HGB 7.8*  HCT 25.3*  PLT 250    Recent Labs  01/02/15 1615  NA 132*  K 3.8  CL 96*  GLUCOSE 273*  BUN 37*  CREATININE 5.72*  CALCIUM 8.3*   CBG (last 3)   Recent Labs  01/04/15 0701 01/04/15 0728 01/04/15 0730  GLUCAP 45* 509* 106*    Wt Readings from Last 3 Encounters:  01/04/15 78.563 kg (173 lb 3.2 oz)  12/26/14 79.9 kg (176 lb 2.4 oz)  12/02/14 82.237 kg (181 lb 4.8 oz)    Physical Exam:  Constitutional: He appears well-developed and well-nourished.  HENT: oral mucosa pink/moist Head: Normocephalic and atraumatic.  Eyes: Conjunctivae are normal. Pupils are equal, round, and reactive to light.  Neck: Normal range of motion. Neck supple.  Cardiovascular: An irregular rhythm present. Normal rhythm  Respiratory: Effort normal. Improved breath sounds. He has no wheezes, no rales. He exhibits no tenderness. No dyspnea GI: Soft. Bowel sounds are normal. He exhibits no distension. There is no tenderness.  Musculoskeletal: He exhibits no edema or tenderness.  Neurological: He is alert.  Oriented to self, place, month. STM deficits. Able to follow simple one and two step motor commands. conversationally appropriate. Motor strength is 4 minus/5 in the right deltoid, biceps, triceps, grip Left grip is 4/5, proximally not tested  secondary to ongoing hemodialysis Lower extremity strength 4 minus hip flexor knee extensor ankle dorsiflexor plantar flexor Sensation diminished bilateral feet to light touch/pain.  Skin: 1.5" tear  on dorsum of left foot. Small blisters/ right foot.  Ext: trace edema on right foot but feet/ legs improved  Assessment/Plan: 1. Functional deficits secondary to debility/encephalopathy which require 3+ hours per day of interdisciplinary therapy in a comprehensive inpatient rehab setting. Physiatrist is providing close team supervision and 24 hour management of active medical problems listed below. Physiatrist and rehab team continue to assess barriers to discharge/monitor patient progress toward functional and medical goals.  Function:  Bathing Bathing position   Position: Wheelchair/chair at sink  Bathing parts Body parts bathed by patient: Right arm, Left arm, Chest, Abdomen, Front perineal area, Right upper leg, Left upper leg, Buttocks, Right lower leg, Left lower leg Body parts bathed by helper: Right lower leg, Left lower leg, Back  Bathing assist Assist Level: Supervision or verbal cues      Upper Body Dressing/Undressing Upper body dressing   What is the patient wearing?: Pull over shirt/dress     Pull over shirt/dress - Perfomed by patient: Thread/unthread right sleeve, Thread/unthread left sleeve, Put head through opening, Pull shirt over trunk          Upper body assist Assist Level: Supervision or verbal cues      Lower Body Dressing/Undressing Lower body dressing   What is the patient wearing?: Pants, Non-skid slipper socks, Underwear Underwear - Performed by patient: Thread/unthread right underwear leg, Thread/unthread left underwear  leg, Pull underwear up/down Underwear - Performed by helper: Thread/unthread right underwear leg, Thread/unthread left underwear leg Pants- Performed by patient: Thread/unthread right pants leg, Thread/unthread left pants leg, Pull pants  up/down, Fasten/unfasten pants Pants- Performed by helper: Thread/unthread right pants leg, Thread/unthread left pants leg Non-skid slipper socks- Performed by patient: Don/doff right sock, Don/doff left sock Non-skid slipper socks- Performed by helper: Don/doff right sock, Don/doff left sock                  Lower body assist Assist Level: Touching or steadying assistance (Pt > 75%)      Toileting Toileting   Toileting steps completed by patient: Adjust clothing prior to toileting, Adjust clothing after toileting, Performs perineal hygiene Toileting steps completed by helper: Adjust clothing prior to toileting Toileting Assistive Devices: Grab bar or rail  Toileting assist Assist level: Touching or steadying assistance (Pt.75%), Supervision or verbal cues   Transfers Chair/bed transfer   Chair/bed transfer method: Ambulatory Chair/bed transfer assist level: Supervision or verbal cues Chair/bed transfer assistive device: Medical sales representative     Max distance: 150 Assist level: Supervision or verbal cues   Wheelchair   Type: Manual Max wheelchair distance: >300 Assist Level: Supervision or verbal cues  Cognition Comprehension Comprehension assist level: Understands basic 75 - 89% of the time/ requires cueing 10 - 24% of the time  Expression Expression assist level: Expresses complex 90% of the time/cues < 10% of the time  Social Interaction Social Interaction assist level: Interacts appropriately 90% of the time - Needs monitoring or encouragement for participation or interaction.  Problem Solving Problem solving assist level: Solves basic 75 - 89% of the time/requires cueing 10 - 24% of the time  Memory Memory assist level: Recognizes or recalls 50 - 74% of the time/requires cueing 25 - 49% of the time   Medical Problem List and Plan: 1. Functional deficits secondary to debilitation related to pneumonia/sepsis/ multi-medical with limbic encephalitis/seizure  disorder  -unable to taper oxygen  -home tomorrow with supervision 2. DVT Prophylaxis/Anticoagulation: Chronic Coumadin therapy. Serial inr's---therapeutic 3. Pain Management: Tylenol as needed 4. End-stage renal disease. Continue hemodialysis in PM after therapy day  5. Neuropsych: This patient is capable of making decisions on his own behalf. 6. Skin/Wound Care: Routine skin checks. Elevate and protect bilateral legs/feet  -local care to skin tears,  -edema improved  -benadryl for itching  -appreciate WOC RN recs---ACE and local care to legs/wounds during day 7. Fluids/Electrolytes/Nutrition: good appetite and intake 8. Hypertension/atrial fibrillation. Amiodarone 400 mg daily, Cardizem stopped, Lopressor 75 mg twice a day.  -remains generally hypotensive, further adjustments per nephrology.   9. Seizure disorder. Dilantin 300 mg daily. Monitor for seizure activity 10. Acute on chronic anemia. hgb 7.8 most recently, aranesp 11. Diabetes: keep lantus at 12u qhs for now    - continue SSI  -hs snack. Sugars have been improving  -continued education for patient/wife      LOS (Days) 9 A FACE TO FACE EVALUATION WAS PERFORMED  SWARTZ,ZACHARY T 01/04/2015 7:34 AM

## 2015-01-04 NOTE — Progress Notes (Signed)
ANTICOAGULATION CONSULT NOTE - Follow Up Consult  Pharmacy Consult for coumadin Indication: atrial fibrillation  Allergies  Allergen Reactions  . Keflex [Cephalexin] Other (See Comments)    c-diff reaction  . Dilaudid [Hydromorphone] Nausea And Vomiting    Patient Measurements: Weight: 173 lb 3.2 oz (78.563 kg) Heparin Dosing Weight:   Vital Signs: Temp: 98.3 F (36.8 C) (09/16 0535) Temp Source: Oral (09/16 0535) BP: 131/87 mmHg (09/16 0535) Pulse Rate: 127 (09/16 0535)  Labs:  Recent Labs  01/02/15 0539 01/02/15 1614 01/02/15 1615 01/03/15 1039 01/04/15 0635  HGB  --  7.8*  --   --   --   HCT  --  25.3*  --   --   --   PLT  --  250  --   --   --   LABPROT 23.5*  --   --  24.4* 25.4*  INR 2.12*  --   --  2.22* 2.34*  CREATININE  --   --  5.72*  --   --     Estimated Creatinine Clearance: 15.3 mL/min (by C-G formula based on Cr of 5.72).   Medications:  Scheduled:  . amiodarone  400 mg Oral Daily  . atorvastatin  40 mg Oral QHS  . calcitRIOL  1.75 mcg Oral Q M,W,F-HD  . colchicine  0.6 mg Oral Daily  . darbepoetin (ARANESP) injection - DIALYSIS  200 mcg Intravenous Q Mon-HD  . feeding supplement (NEPRO CARB STEADY)  237 mL Oral BID BM  . fluocinonide ointment   Topical QID  . guaiFENesin  600 mg Oral BID  . heparin  1,000 Units Intracatheter Once  . heparin  1,000 Units Intracatheter Once  . insulin aspart  0-15 Units Subcutaneous TID WC  . insulin glargine  12 Units Subcutaneous QHS  . metoprolol tartrate  75 mg Oral BID  . multivitamin  1 tablet Oral QHS  . nystatin   Topical BID  . phenytoin  300 mg Oral q morning - 10a  . saccharomyces boulardii  250 mg Oral BID  . warfarin  3 mg Oral q1800  . Warfarin - Pharmacist Dosing Inpatient   Does not apply q1800   Infusions:    Assessment: 59 yo male with afib is currently on therapeutic coumadin.  INR is 2.34.  Goal of Therapy:  INR 2-3 Monitor platelets by anticoagulation protocol: Yes   Plan:   - coumadin 3 mg po daily - INR in am  Maryanna Shape, PharmD, BCPS  Clinical Pharmacist  Pager: [phone]   01/04/2015,11:55 AM

## 2015-01-04 NOTE — Progress Notes (Signed)
Assessment/Plan: 1. Acute encephalopathy/ "limbic" encephalitis- improving. S/p plasma exchanges- last 9/3 2. Sepsis / Cdiff - sp course po vanc , no abx now 3. HTN/volume excess - LE edema, BP limits volume removal. UF 2 L on Monday 1.5 L on Wed with post weight 77.8 - try for EDW 77 tomorrow 4. afib- on coumadin and amiodarone ,MTP and off diltiazem - NEED TO define who is going to monitor after d/c.  [facility] will 5. Seizure- dilantin 6. ESRD - MWF [place]- HD today - continue 3 K bath after d/c 7 Anemia- 53% sat - no indication for Fe Hgb 7.8 - on max ESA 200 q Monday last given 9/12 - no recent transfusions 8. Nutrition alb 2.8 - renal diet + suppl 9. Metabolic bone disease - iPTH last 427 continue calcitriol - corrected Ca ok 9. Neurocognitive d/o; adjustment d/o with anxious mood - recommendations from evaluation 9/13 need to be forwarded to his HD unit after discharge to share with the staff who participates in his care.  Subjective: Interval History: Overall improving  Objective: Vital signs in last 24 hours: Temp:  [98.3 F (36.8 C)-98.4 F (36.9 C)] 98.3 F (36.8 C) (09/16 0535) Pulse Rate:  [94-127] 127 (09/16 0535) Resp:  [16] 16 (09/16 0535) BP: (102-131)/(59-87) 131/87 mmHg (09/16 0535) SpO2:  [97 %-100 %] 97 % (09/16 0535) Weight:  [78.563 kg (173 lb 3.2 oz)] 78.563 kg (173 lb 3.2 oz) (09/16 0535) Weight change: -0.537 kg (-1 lb 2.9 oz)  Intake/Output from previous day: 09/15 0701 - 09/16 0700 In: 720 [P.O.:720] Out: -  Intake/Output this shift:    General appearance: alert and cooperative Extremities: edema leg edema much improved, wounds covered Neurologic: Grossly normal  Lab Results:  Recent Labs  01/02/15 1614  WBC 14.0*  HGB 7.8*  HCT 25.3*  PLT 250   BMET:  Recent Labs  01/02/15 1615  NA 132*  K 3.8  CL 96*  CO2 23  GLUCOSE 273*  BUN 37*  CREATININE 5.72*  CALCIUM 8.3*   No results for input(s): PTH in the last 72  hours. Iron Studies:  Recent Labs  01/02/15 1614  IRON 60  TIBC 113*   Studies/Results: No results found.  Scheduled: . amiodarone  400 mg Oral Daily  . atorvastatin  40 mg Oral QHS  . calcitRIOL  1.75 mcg Oral Q M,W,F-HD  . colchicine  0.6 mg Oral Daily  . darbepoetin (ARANESP) injection - DIALYSIS  200 mcg Intravenous Q Mon-HD  . feeding supplement (NEPRO CARB STEADY)  237 mL Oral BID BM  . fluocinonide ointment   Topical QID  . guaiFENesin  600 mg Oral BID  . heparin  1,000 Units Intracatheter Once  . heparin  1,000 Units Intracatheter Once  . insulin aspart  0-15 Units Subcutaneous TID WC  . insulin glargine  12 Units Subcutaneous QHS  . metoprolol tartrate  75 mg Oral BID  . multivitamin  1 tablet Oral QHS  . nystatin   Topical BID  . phenytoin  300 mg Oral q morning - 10a  . saccharomyces boulardii  250 mg Oral BID  . warfarin  3 mg Oral q1800  . Warfarin - Pharmacist Dosing Inpatient   Does not apply q1800    LOS: 9 days   Rai Severns C 01/04/2015,11:13 AM

## 2015-01-04 NOTE — Progress Notes (Signed)
Occupational Therapy Session Note  Patient Details  Name: Joseph [name] MRN: [id number] Date of Birth: [birth year]  Today's Date: 01/04/2015 OT Individual Time: 8299-3716 OT Individual Time Calculation (min): 54 min    Short Term Goals: Week 1:  OT Short Term Goal 1 (Week 1): STGs=LTGS secondary to estimated short LOS  Skilled Therapeutic Interventions/Progress Updates:    Upon entering the room, pt seated in recliner chair awaiting therapist with no c/o pain this session. Pt remains on 2L O2 via [place] this session. Pt ambulated 15' into bathroom with use of RW for toileting. Pt requiring supervision for safety during clothing management and hygiene while standing. Pt engaged in sit <>stand from wheelchair for bathing and LB dressing with close supervision for safety as well as min verbal cues for initiation and safety awareness. Pt requiring min verbal cues as well for energy conservation techniques during self care tasks. Pt returned to recliner chair where therapist wrapped B LEs with ACE wrap and elevated B legs as well. Pt seated in recliner chair with call bell and all needed items within reach upon exiting the room.  Therapy Documentation Precautions:  Precautions Precautions: Fall Precaution Comments: oxygen needs, multiple lines/ leads Restrictions Weight Bearing Restrictions: No   Pain: Pain Assessment Pain Assessment: No/denies pain Pain Score: 0-No pain  See Function Navigator for Current Functional Status.   Therapy/Group: Individual Therapy  [name] 01/04/2015, 11:57 AM

## 2015-01-04 NOTE — Discharge Summary (Signed)
Discharge summary job # [id number]

## 2015-01-04 NOTE — Progress Notes (Signed)
Physical Therapy Discharge Summary  Patient Details  Name: Joseph [name] MRN: [id number] Date of Birth: 02-21-1956  Today's Date: 01/04/2015 PT Individual Time: 1300-1345 PT Individual Time Calculation: 45 min    Patient has met 9 of 9 long term goals due to improved balance, improved postural control, increased strength, improved attention, improved awareness and improved coordination.  Patient to discharge at an ambulatory level Supervision.   Patient's care partner is independent to provide the necessary supervision assistance at discharge.   Recommendation:  Patient will benefit from ongoing skilled PT services in home health setting to continue to advance safe functional mobility, address ongoing impairments in endurance, strength, activity tolerance, and balance, and minimize fall risk.  Equipment: w/c and RW  Reasons for discharge: treatment goals met  Patient/family agrees with progress made and goals achieved: Yes   Skilled Therapeutic Intervention:  Pt received in recliner and agreeable to therapy. Wife present for family education during grad day. Session focused on family education for function transfers, amb with RW, stair negotiation, car transfers, and bed mobility.  PT provided education and answered wife's questions to her satisfaction.  Pt became frustrated when he had difficulty with car transfer and wife was able to redirect patient and complete transfer without intervention from therapist.  Pt returned to room at end of session and positioned in recliner with call bell in reach and needs met.    PT Discharge Precautions/Restrictions Precautions Precautions: Fall Precaution Comments: decreased safety awareness and problem solving Restrictions Weight Bearing Restrictions: No Vital Signs  Pain Pain Assessment Pain Assessment: No/denies pain Vision/Perception  Vision - Assessment Eye Alignment: Within Functional Limits  Cognition Overall Cognitive Status:  Within Functional Limits for tasks assessed Arousal/Alertness: Awake/alert Orientation Level: Oriented to person;Oriented to place;Other (comment) (oriented to month only, states he's in the hospital  "because of his mind.") Memory: Impaired Memory Impairment: Decreased recall of new information;Decreased short term memory Decreased Short Term Memory: Verbal basic;Functional basic Awareness: Impaired Problem Solving: Impaired Problem Solving Impairment: Verbal basic;Functional basic Sensation Sensation Light Touch: Impaired Detail Light Touch Impaired Details: Impaired RLE (pt reports no sensation to light touch from L3-L5 distribution) Coordination Gross Motor Movements are Fluid and Coordinated: Yes Fine Motor Movements are Fluid and Coordinated: Yes Finger Nose Finger Test: [facility] bilaterally Heel Shin Test: [facility] bilaterally Motor  Motor Motor: Within Functional Limits Motor - Discharge Observations: endurance deficit improving but persists, generalized LE weakness noted bilaterally  Mobility Bed Mobility Bed Mobility: Sit to Supine;Supine to Sit Supine to Sit: 6: Modified independent (Device/Increase time) Sit to Supine: 6: Modified independent (Device/Increase time) Transfers Transfers: Yes Sit to Stand: 5: Supervision Sit to Stand Details: Verbal cues for safe use of DME/AE Stand to Sit: 5: Supervision Stand to Sit Details (indicate cue type and reason): Verbal cues for safe use of DME/AE Stand Pivot Transfers: 5: Supervision Stand Pivot Transfer Details: Verbal cues for safe use of DME/AE Locomotion  Ambulation Ambulation: Yes Ambulation/Gait Assistance: 5: Supervision Ambulation Distance (Feet): 150 Feet Assistive device: Rolling walker Gait Gait: Yes Gait Pattern: Impaired Gait Pattern: Decreased stride length;Trunk flexed;Narrow base of support;Step-through pattern Stairs / Additional Locomotion Stairs: Yes Stairs Assistance: 5: Supervision Stairs Assistance  Details: Verbal cues for technique Stair Management Technique: Two rails Number of Stairs: 8 Wheelchair Mobility Wheelchair Mobility: No  Trunk/Postural Assessment  Cervical Assessment Cervical Assessment: Exceptions to [facility] (foward head posture at rest) Thoracic Assessment Thoracic Assessment: Exceptions to [facility] (rounded shoulders, slightly kyphotic at rest) Lumbar Assessment Lumbar  Assessment: Exceptions to [facility] (posterior pelvic tilt)  Balance Balance Balance Assessed: Yes Static Standing Balance Static Standing - Balance Support: During functional activity Static Standing - Level of Assistance: 5: Stand by assistance Dynamic Standing Balance Dynamic Standing - Balance Support: During functional activity;No upper extremity supported Dynamic Standing - Level of Assistance: 5: Stand by assistance Dynamic Standing - Balance Activities: Forward lean/weight shifting;Lateral lean/weight shifting;Reaching across midline Extremity Assessment      RLE Assessment RLE Assessment: Exceptions to [facility] RLE Strength RLE Overall Strength Comments: hip flexion 3+/5, knee extension 4/5, knee flexion 4/5, DF 3+/5 LLE Assessment LLE Assessment: Exceptions to [facility] LLE Strength LLE Overall Strength Comments: hip flexion 3+/5, knee extension 4/5, knee flexion 4/5, DF 3+/5   See Function Navigator for Current Functional Status.  Caitlin E Penven-Crew 01/04/2015, 2:21 PM

## 2015-01-04 NOTE — Progress Notes (Signed)
Hypoglycemic Event  CBG: 45  Treatment: 4oz Sprite, 2packs of graham crackers, milk  Symptoms: Shaky  Follow-up CBG: Time:0730 CBG Result:106  Possible Reasons for Event: Unknown  Comments/MD notified: Dr. Ninfa Linden to monitor     Doggett, William Hamburger  Remember to initiate Hypoglycemia Order Set & complete

## 2015-01-04 NOTE — Discharge Instructions (Signed)
Inpatient Rehab Discharge Instructions  Joseph [name] Discharge date and time: No discharge date for patient encounter.   Activities/Precautions/ Functional Status: Activity: activity as tolerated Diet: renal diet Wound Care: Keep wound clean and dry Functional status:  ___ No restrictions     ___ Walk up steps independently ___ 24/7 supervision/assistance   ___ Walk up steps with assistance ___ Intermittent supervision/assistance  ___ Bathe/dress independently ___ Walk with walker     ___ Bathe/dress with assistance ___ Walk Independently    ___ Shower independently _x__ Walk with assistance    ___ Shower with assistance ___ No alcohol     ___ Return to work/school ________   COMMUNITY REFERRALS UPON DISCHARGE:    Home Health:   PT     OT     ST   RN                       Agency:  [facility] Phone: [phone]   Medical Equipment/Items Ordered: wheelchair, cushion, walker, commode,home Oxygen, tub bench                                                     Agency/Supplier: [facility] @ [phone]     Special Instructions: Continue dialysis as directed  Home health nurse to check INR on Tuesday, 01/08/2015 results to [facility] Coumadin clinic [phone] fax number [phone]   My questions have been answered and I understand these instructions. I will adhere to these goals and the provided educational materials after my discharge from the hospital.  Patient/Caregiver Signature _______________________________ Date __________  Clinician Signature _______________________________________ Date __________  Please bring this form and your medication list with you to all your follow-up doctor's appointments.

## 2015-01-04 NOTE — Progress Notes (Signed)
Occupational Therapy Session Note  Patient Details  Name: Joseph [name] MRN: [id number] Date of Birth: 01-24-1956  Today's Date: 01/04/2015 OT Individual Time: 1445-1515 OT Individual Time Calculation (min): 30 min    Short Term Goals: Week 1:  OT Short Term Goal 1 (Week 1): STGs=LTGS secondary to estimated short LOS  Skilled Therapeutic Interventions/Progress Updates:    Completed family education with pt and pt's wife on functional ambulation with RW, energy conservation, management of O2, pursed lip breathing, signs to look for desaturations, activity tolerance, bathing at shower level, use of tub bench for shower transfer, and to ace wrap his legs. Pt performed toileting with supervision and toilet transfer with supervision.   Ordered a 3:1 and tub bench.   Therapy Documentation Precautions:  Precautions Precautions: Fall Precaution Comments: decreased safety awareness and problem solving Restrictions Weight Bearing Restrictions: No Pain: Pain Assessment Pain Assessment: No/denies pain Other Treatments:    See Function Navigator for Current Functional Status.   Therapy/Group: Individual Therapy  Willeen [facility] 01/04/2015, 3:41 PM

## 2015-01-04 NOTE — Progress Notes (Signed)
Speech Language Pathology Daily Session Note  Patient Details  Name: Joseph [name] MRN: [id number] Date of Birth: 1955/12/20  Today's Date: 01/04/2015 SLP Individual Time: 1130-1200 SLP Individual Time Calculation (min): 30 min  Short Term Goals: Week 1: SLP Short Term Goal 1 (Week 1): Patient will demonstrate functional problem solving for mildly complex but familair tasks with Min A multimodal cues.  SLP Short Term Goal 2 (Week 1): Patient will utilize memory compensatory strategies to recall new, daily information with Mod A multimodal cues.  SLP Short Term Goal 3 (Week 1): Patient will self-monitor and correct errors during functional tasks with Min A multimodal cues.   Skilled Therapeutic Interventions: Skilled treatment session focused on cognitive goals. SLP facilitated session by providing Mod A verbal and question cues for utilization of the memory strategy of "association" to increase recall of new information during a novel, memory task. Patient demonstrated selective attention to task for ~30 minutes with Mod I and demonstrated increased recall of items named throughout the task with overall Min A. Patient left upright in recliner with all needs within reach. Continue with current plan of care.    Function:  Cognition Comprehension Comprehension assist level: Understands basic 90% of the time/cues < 10% of the time  Expression   Expression assist level: Expresses complex 90% of the time/cues < 10% of the time  Social Interaction Social Interaction assist level: Interacts appropriately 90% of the time - Needs monitoring or encouragement for participation or interaction.  Problem Solving Problem solving assist level: Solves basic 75 - 89% of the time/requires cueing 10 - 24% of the time  Memory Memory assist level: Recognizes or recalls 50 - 74% of the time/requires cueing 25 - 49% of the time    Pain Pain Assessment Pain Assessment: No/denies pain  Therapy/Group: Individual  Therapy  PAYNE, COURTNEY 01/04/2015, 4:30 PM

## 2015-01-04 NOTE — Progress Notes (Signed)
Speech Language Pathology Session Note & Discharge Summary  Patient Details  Name: Joseph [name] MRN: [id number] Date of Birth: 18-Oct-1955  Today's Date: 01/04/2015 SLP Individual Time: 1400-1445 SLP Individual Time Calculation (min): 45 min   Skilled Therapeutic Interventions: Skilled treatment session focused on completion of patient and family education with the patient's wife. Both were educated on strategies to utilize at home to maximize recall and carryover at home and the importance of 24 hour supervision to maximize safety. Both verbalized understanding and handouts were also given to reinforce information. Patient handed off to OT.     Patient has met 3 of 3 long term goals.  Patient to discharge at overall Min;Mod level.   Reasons goals not met: N/A    Clinical Impression/Discharge Summary: Patient has made functional gains and has met 3 of 3 LTG's this admission due to increased cognitive function. Currently, patient requires overall Mod A multimodal cues for recall of new, daily information which is impacted by decrease utilization of memory compensatory strategies due to visual deficits. Patient also requires Min A verbal cues for functional problem solving and emergent awareness of errors with functional tasks. Patient and family education has been completed and patient will discharge home with 24 hour supervision. Patient would benefit from f/u home health SLP services to maximize cognitive function and overall functional independence in order to reduce caregiver burden.   Care Partner:  Caregiver Able to Provide Assistance: Yes  Type of Caregiver Assistance: Physical;Cognitive  Recommendation:  Home Health SLP;24 hour supervision/assistance  Rationale for SLP Follow Up: Maximize cognitive function and independence;Reduce caregiver burden   Equipment: N/A    Reasons for discharge: Treatment goals met;Discharged from hospital   Patient/Family Agrees with Progress Made and  Goals Achieved: Yes   Function:   Cognition Comprehension Comprehension assist level: Understands basic 90% of the time/cues < 10% of the time  Expression   Expression assist level: Expresses complex 90% of the time/cues < 10% of the time  Social Interaction Social Interaction assist level: Interacts appropriately 90% of the time - Needs monitoring or encouragement for participation or interaction.  Problem Solving Problem solving assist level: Solves basic 75 - 89% of the time/requires cueing 10 - 24% of the time  Memory Memory assist level: Recognizes or recalls 50 - 74% of the time/requires cueing 25 - 49% of the time   PAYNE, COURTNEY 01/04/2015, 4:42 PM

## 2015-01-04 NOTE — Discharge Summary (Signed)
NAMEALEXIE, [name] NO.:  [id number]  MEDICAL RECORD NO.:  [id number]  LOCATION:  4M12C                        FACILITY:  [facility]  PHYSICIAN:  Meredith Staggers, M.D.DATE OF BIRTH:  [birth year]  DATE OF ADMISSION:  12/26/2014 DATE OF DISCHARGE:  01/05/2015                              DISCHARGE SUMMARY   DISCHARGE DIAGNOSES: 1. 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 2. Chronic Coumadin therapy. 3. End-stage renal disease with hemodialysis. 4. Hypertension with atrial fibrillation. 5. Seizure disorder. 6. Acute-on-chronic anemia. 7. Diabetes mellitus with peripheral neuropathy.  HISTORY OF PRESENT ILLNESS:  This is a 59 year old right-handed male, history of diabetes mellitus, chronic Coumadin, end-stage renal disease, recent hospitalization on November 18, 2014 to December 02, 2014, for encephalopathy due to limbic encephalitis.  He was ultimately discharged to a skilled nursing facility for rehab.  Readmitted on December 09, 2014, with fever, nausea, vomiting, persistent cough, mild hemoptysis, noted hypotension, felt to be secondary to sepsis, left lower lobe pneumonia identified.  He received a bolus of IV fluids, placed on broad-spectrum antibiotics.  His chronic Coumadin was changed to intravenous heparin. The patient with lethargy.  MRI of the brain showed persistent abnormal flare involving the mesial temporal lobes, question of trace diffusion abnormality within the right hippocampus, felt to represent ongoing encephalitis.  The patient did receive plasma exchange x5 days per Neurology Services.  Stool was checked for C. diff, showed evidence of colonization, remained on contact precautions.  Acute-on-chronic anemia, bouts of hemoptysis.  His Coumadin had since been held and later resumed with hemoglobin remaining stable.  He was receiving  intravenous amiodarone, Cardizem and monitoring.  Mentation improved, suspect acute encephalopathy.  Physical and occupational therapy ongoing.  The patient was admitted for comprehensive rehab program.  PAST MEDICAL HISTORY:  See discharge diagnoses.  SOCIAL HISTORY:  Lives with his wife, used a single-point cane prior to admission.  FUNCTIONAL STATUS:  Upon admission to [facility], was minimal assist to ambulate 25 feet with rolling walker, minimal assist stand pivot transfers, min-to-mod assist activities of daily living.  PHYSICAL EXAMINATION:  VITAL SIGNS:  Blood pressure 119/75, pulse 76, temperature 98, respirations 18. GENERAL:  This was an alert male.  He was oriented to self and place, followed simple commands. LUNGS:  Clear to auscultation. CARDIAC:  Rate controlled. ABDOMEN:  Soft, nontender.  Good bowel sounds.  REHABILITATION HOSPITAL COURSE:  The patient was admitted to Inpatient Rehab Services with therapies initiated on a 3-hour daily basis consisting of physical therapy, occupational therapy, speech therapy, and rehabilitation nursing.  The following issues were addressed during the patient's rehabilitation stay.  Pertaining to Mr. Olmeda, functional deficit secondary to debilitation, pneumonia, sepsis, remained stable, antibiotic therapy since been completed.  Mentation continued to improve towards baseline.  Chronic Coumadin therapy.  He would be followed outpatient at the Coumadin Clinic.  No bleeding episodes.  Hemodialysis ongoing as per [facility].  Blood pressures, heart rate controlled on amiodarone.  He had no chest pain or shortness of breath.  His Lopressor had been adjusted to  75 mg twice daily.  He continued on Dilantin for seizure disorder.  No seizure activity noted.  Acute-on- chronic anemia.  He was receiving Aranesp while with hemodialysis. Latest hemoglobin of 7.8, which was stable.  Blood sugars well controlled.  The patient received  weekly collaborative interdisciplinary team conferences to discuss estimated length of stay, family teaching, and any barriers to discharge.  He was ambulating 150 feet with rolling walker, supervision.  He was given rest breaks for bouts of fatigue.  He was weaned from his oxygen.  Instructed the patient a dynamic standing balance activities.  Performed sit to stand and ambulate to the therapy gym with rolling walker and supervision, minimal cues.  Activities of daily living with dressing, grooming at home making.  He needed some encouragement at times to participate.  Required multiple rest breaks throughout the sessions secondary again to bouts of fatigue.  Engaged in sessions to help assist with bathing and dressing.  Full family teaching was completed.  Plan, discharged to home with ongoing therapies dictated per [facility].  DISCHARGE MEDICATIONS: 1. Amiodarone 400 mg p.o. daily. 2. Lipitor 40 mg p.o. daily. 3. Rocaltrol 1.75 mcg every Monday, Wednesday, Friday hemodialysis. 4. Colchicine 0.6 mg p.o. daily. 5. Lantus insulin 12 units at bedtime. 6. Lopressor 75 mg p.o. b.i.d. 7. Multivitamin daily. 8. Naphcon ophthalmic solution 0.3% two drops, both eyes four times     daily as needed. 9. Dilantin 300 mg p.o. every morning. 10.Florastor 250 mg p.o. b.i.d. 11.Coumadin latest dose of 3 mg daily, adjusted accordingly for INR of     2.0 to 3.0.  FOLLOWUP:  The patient would follow up with Dr. Alger Simons at the Outpatient Rehab Service Office as needed; Dr. Webb Silversmith, medical management; Dr. Roney Jaffe in regard to hemodialysis.  A home health nurse had been arranged to check INR on Tuesday, January 08, 2015, results to [facility], [phone], fax number [phone].     Lauraine Rinne, P.A.   ______________________________ Meredith Staggers, M.D.   DA/MEDQ  D:  01/04/2015  T:  01/04/2015  Job:  [id number]  cc:   Sol Blazing, M.D. Webb Silversmith, FNP

## 2015-01-04 NOTE — Progress Notes (Signed)
Social Work Patient ID: Joseph [name], male   DOB: 08-12-1955, 59 y.o.   MRN: [id number] Have added a tub bench to pt's equipment order, aware of the private pay for this item.

## 2015-01-05 LAB — GLUCOSE, CAPILLARY: Glucose-Capillary: 150 mg/dL — ABNORMAL HIGH (ref 65–99)

## 2015-01-05 NOTE — Progress Notes (Signed)
PHYSICAL MEDICINE & REHABILITATION     PROGRESS NOTE    Subjective/Complaints: Ready to go.home His pharmacy does not have Dilantin of Metoprolol in stock until Monday  ROS: Pt denies fever, rash/itching, headache, blurred or double vision, nausea, vomiting, abdominal pain, diarrhea, chest pain, shortness of breath, palpitations, dysuria, dizziness,  , bleeding, or depression   Objective: Vital Signs: Blood pressure 109/62, pulse 92, temperature 97.4 F (36.3 C), temperature source Oral, resp. rate 16, weight 76.749 kg (169 lb 3.2 oz), SpO2 94 %. No results found.  Recent Labs  01/02/15 1614 01/04/15 1600  WBC 14.0* 13.4*  HGB 7.8* 8.1*  HCT 25.3* 26.4*  PLT 250 245    Recent Labs  01/02/15 1615 01/04/15 1600  NA 132* 134*  K 3.8 3.8  CL 96* 97*  GLUCOSE 273* 180*  BUN 37* 32*  CREATININE 5.72* 5.58*  CALCIUM 8.3* 8.4*   CBG (last 3)   Recent Labs  01/04/15 1125 01/04/15 2102 01/05/15 0651  GLUCAP 247* 120* 150*    Wt Readings from Last 3 Encounters:  01/05/15 76.749 kg (169 lb 3.2 oz)  12/26/14 79.9 kg (176 lb 2.4 oz)  12/02/14 82.237 kg (181 lb 4.8 oz)    Physical Exam:  Constitutional: He appears well-developed and well-nourished.  HENT: oral mucosa pink/moist Head: Normocephalic and atraumatic.  Eyes: Conjunctivae are normal. Pupils are equal, round, and reactive to light.  Neck: Normal range of motion. Neck supple.  Cardiovascular: An irregular rhythm present. Normal rhythm  Respiratory: Effort normal. Improved breath sounds. He has no wheezes, no rales. He exhibits no tenderness. No dyspnea GI: Soft. Bowel sounds are normal. He exhibits no distension. There is no tenderness.  Musculoskeletal: He exhibits no edema or tenderness.  Neurological: He is alert.  . conversationally appropriate. Motor strength is 4 minus/5 in the right deltoid, biceps, triceps, grip Left grip is 4/5, proximally not tested secondary to ongoing  hemodialysis Lower extremity strength 4 minus hip flexor knee extensor ankle dorsiflexor plantar flexor Sensation diminished bilateral feet to light touch/pain.  Skin: 1.5" tear  on dorsum of left foot. Small blisters/ right foot.  Ext: trace edema on right foot but feet/ legs improved  Assessment/Plan: 1. Functional deficits secondary to debility/encephalopathy Stable for D/C today F/u PCP in 1-2 weeks F/u nephro See D/C summary See D/C instructions Function:  Bathing Bathing position   Position: Wheelchair/chair at sink  Bathing parts Body parts bathed by patient: Right arm, Left arm, Chest, Abdomen, Front perineal area, Right upper leg, Left upper leg, Buttocks, Right lower leg, Left lower leg Body parts bathed by helper: Right lower leg, Left lower leg, Back  Bathing assist Assist Level: Supervision or verbal cues      Upper Body Dressing/Undressing Upper body dressing   What is the patient wearing?: Pull over shirt/dress     Pull over shirt/dress - Perfomed by patient: Thread/unthread right sleeve, Thread/unthread left sleeve, Put head through opening, Pull shirt over trunk          Upper body assist Assist Level: Supervision or verbal cues      Lower Body Dressing/Undressing Lower body dressing   What is the patient wearing?: Pants, Non-skid slipper socks, Underwear Underwear - Performed by patient: Thread/unthread right underwear leg, Thread/unthread left underwear leg, Pull underwear up/down Underwear - Performed by helper: Thread/unthread right underwear leg, Thread/unthread left underwear leg Pants- Performed by patient: Thread/unthread right pants leg, Thread/unthread left pants leg, Pull pants up/down, Fasten/unfasten pants Pants-  Performed by helper: Thread/unthread right pants leg, Thread/unthread left pants leg Non-skid slipper socks- Performed by patient: Don/doff right sock, Don/doff left sock Non-skid slipper socks- Performed by helper: Don/doff right  sock, Don/doff left sock                  Lower body assist Assist Level: Supervision or verbal cues      Toileting Toileting   Toileting steps completed by patient: Adjust clothing prior to toileting, Adjust clothing after toileting, Performs perineal hygiene Toileting steps completed by helper: Adjust clothing prior to toileting Toileting Assistive Devices: Grab bar or rail  Toileting assist Assist level: Supervision or verbal cues   Transfers Chair/bed transfer   Chair/bed transfer method: Ambulatory Chair/bed transfer assist level: Supervision or verbal cues Chair/bed transfer assistive device: Medical sales representative     Max distance: 150 Assist level: Supervision or verbal cues   Wheelchair   Type: Manual Max wheelchair distance: >300 Assist Level: Supervision or verbal cues  Cognition Comprehension Comprehension assist level: Understands basic 90% of the time/cues < 10% of the time  Expression Expression assist level: Expresses complex 90% of the time/cues < 10% of the time  Social Interaction Social Interaction assist level: Interacts appropriately 90% of the time - Needs monitoring or encouragement for participation or interaction.  Problem Solving Problem solving assist level: Solves basic 75 - 89% of the time/requires cueing 10 - 24% of the time  Memory Memory assist level: Recognizes or recalls 50 - 74% of the time/requires cueing 25 - 49% of the time   Medical Problem List and Plan: 1. Functional deficits secondary to debilitation related to pneumonia/sepsis/ multi-medical with limbic encephalitis/seizure disorder  2. DVT Prophylaxis/Anticoagulation: Chronic Coumadin therapy. Serial inr's---therapeutic 3. Pain Management: Tylenol as needed 4. End-stage renal disease. Continue hemodialysis as outpt 5. Neuropsych: This patient is capable of making decisions on his own behalf. 6. Skin/Wound Care: Routine skin checks. Elevate and protect bilateral  legs/feet  -local care to skin tears,  -edema improved  -benadryl for itching  -appreciate WOC RN recs---ACE and local care to legs/wounds during day 7. Fluids/Electrolytes/Nutrition: good appetite and intake 8. Hypertension/atrial fibrillation. Amiodarone 400 mg daily, Cardizem stopped, Lopressor 75 mg twice a day.   9. Seizure disorder. Dilantin 300 mg daily. Monitor for seizure activity-pharmacy to send up 2 doses until pt can get Rx filled Monday 10. Acute on chronic anemia. hgb 7.8 most recently, aranesp 11. Diabetes: keep lantus at 12u qhs for now    - continue SSI  -hs snack. Sugars have been improving  -continued education for patient/wife      LOS (Days) 10 A FACE TO FACE EVALUATION WAS PERFORMED  Sye Schroepfer E 01/05/2015 10:09 AM

## 2015-01-05 NOTE — Progress Notes (Signed)
Occupational Therapy Discharge Summary  Patient Details  Name: Joseph [name] MRN: [id number] Date of Birth: 1955/05/03    Patient has met 9 of 10 long term goals due to improved activity tolerance, improved balance, postural control, ability to compensate for deficits, improved attention, improved awareness and improved coordination.  Patient to discharge at overall Supervision level.  Patient's care partner is independent to provide the necessary cognitive assistance at discharge.    Reasons goals not met: simple meal prep goal not met as pt declined to address with therapy .  Recommendation:  Patient will benefit from ongoing skilled OT services in home health setting to continue to advance functional skills in the area of BADL and iADL.  Equipment: recommended  3 in 1 and shower chair  Reasons for discharge: treatment goals met and discharge from hospital  Patient/family agrees with progress made and goals achieved: Yes  OT Discharge Precautions/Restrictions  Precautions Precautions: Fall Precaution Comments: decreased safety awareness and problem solving Restrictions Weight Bearing Restrictions: No Vital Signs Therapy Vitals Temp: 97.4 F (36.3 C) Temp Source: Oral Pulse Rate: 92 Resp: 16 BP: 109/62 mmHg Patient Position (if appropriate): Sitting Oxygen Therapy SpO2: 94 % O2 Device: Nasal Cannula O2 Flow Rate (L/min): 2 L/min Vision/Perception  Vision- History Baseline Vision/History: Wears glasses Wears Glasses: At all times Vision- Assessment Vision Assessment?: Yes Eye Alignment: Within Functional Limits Ocular Range of Motion: Within Functional Limits  Cognition Overall Cognitive Status: Impaired/Different from baseline Arousal/Alertness: Lethargic Orientation Level: Oriented X4 Focused Attention: Appears intact Sustained Attention: Appears intact Selective Attention: Appears intact Memory: Impaired Memory Impairment: Decreased recall of new  information;Decreased short term memory Decreased Short Term Memory: Verbal basic;Functional basic Awareness: Impaired Awareness Impairment: Anticipatory impairment Problem Solving: Impaired Problem Solving Impairment: Verbal basic;Functional basic Behaviors: Poor frustration tolerance Safety/Judgment: Appears intact Sensation Sensation Light Touch: Impaired Detail Light Touch Impaired Details: Impaired RLE Coordination Gross Motor Movements are Fluid and Coordinated: Yes Fine Motor Movements are Fluid and Coordinated: Yes Finger Nose Finger Test: [facility] bilaterally Motor  Motor Motor: Within Functional Limits Motor - Discharge Observations: endurance deficit improving but persists Mobility  Bed Mobility Bed Mobility: Sit to Supine;Supine to Sit Supine to Sit: 6: Modified independent (Device/Increase time) Sit to Supine: 6: Modified independent (Device/Increase time) Transfers Sit to Stand: 5: Supervision Sit to Stand Details: Verbal cues for safe use of DME/AE Stand to Sit: 5: Supervision Stand to Sit Details (indicate cue type and reason): Verbal cues for safe use of DME/AE  Trunk/Postural Assessment  Cervical Assessment Cervical Assessment: Exceptions to [facility] (forward head) Thoracic Assessment Thoracic Assessment: Exceptions to [facility] (kyphotic) Lumbar Assessment Lumbar Assessment: Exceptions to [facility] (posterior pelvic tilt)  Balance Balance Balance Assessed: Yes Static Standing Balance Static Standing - Balance Support: During functional activity Static Standing - Level of Assistance: 5: Stand by assistance Dynamic Standing Balance Dynamic Standing - Balance Support: During functional activity;No upper extremity supported Dynamic Standing - Level of Assistance: 5: Stand by assistance Dynamic Standing - Balance Activities: Forward lean/weight shifting;Lateral lean/weight shifting;Reaching across midline Extremity/Trunk Assessment RUE Assessment RUE Assessment: Within  Functional Limits LUE Assessment LUE Assessment: Within Functional Limits   See Function Navigator for Current Functional Status.  Joseph [name] 01/05/2015, 6:44 AM

## 2015-01-05 NOTE — Progress Notes (Signed)
[facility] Progress Note  Assessment/Plan: 1. Acute encephalopathy/ "limbic" encephalitis- improving. S/p plasma exchanges- last 9/3. Being [facility] home today.  2. Sepsis / Cdiff - sp course po vanc , no abx now. No diarrhea reported 3. HTN/volume excess - LE edema persists, HD yesterday. Net UF 3000. Post wt. 77.1. Feel OP EDW should be lowered to 76 kgs.  4. afib- on coumadin and amiodarone ,MTP and off diltiazem - NEED TO define who is going to monitor after d/[name]. [facility] will 5. Seizure- dilantin 6. ESRD - MWF [place]- HD today - continue 3 K bath after d/[name] 7 Anemia- HGB 8.1 Continue ESA therapy. 8. Nutrition alb 2.8 - renal diet + suppl 9. Metabolic bone disease - iPTH last 427 continue calcitriol - corrected Ca ok 9. Neurocognitive d/o; adjustment d/o with anxious mood - recommendations from evaluation 9/13 need to be forwarded to his HD unit after discharge to share with the staff who participates in his care.  Rita H. Brown NP-[name] 01/05/2015, 12:03 PM  [facility] [phone]  Renal Attending: I agree with the note as articulated above. Joseph [name],Joseph [name]   Subjective:  "I'm telling you I'm ready to go!" Impatiently awaiting DC. Wife at bedside. No [name]/[name].   Objective Filed Vitals:   01/04/15 1920 01/04/15 1936 01/04/15 2108 01/05/15 0436  BP: 129/93 111/93 109/60 109/62  Pulse: 85 60 60 92  Temp:  97.3 F (36.3 [name]) 97.6 F (36.4 [name]) 97.4 F (36.3 [name])  TempSrc:  Oral Oral Oral  Resp:  19 16 16   Weight:  77.1 kg (169 lb 15.6 oz) 77.202 kg (170 lb 3.2 oz) 76.749 kg (169 lb 3.2 oz)  SpO2:  99% 99% 94%   Physical Exam General: Well nourished, NAD.  Heart: S1,S2, irregular.  Lungs: Bilateral breathe sounds CTA.  Abdomen: active BS, slightly distended, nontender Extremities: Persistent LE edema. R>L. Skin dry and flaky.  Dialysis Access: left AVF + bruit  Dialysis Orders: MWF [facility] 4 hrs 80kgs 3K/2.5Ca No Heparin Profile 2 Micera  100 q 2 weeks- to start 8/24 Calcitriol 1.75 Additional Objective Labs: Basic Metabolic Panel:  Recent Labs Lab 12/31/14 2138 01/02/15 1615 01/04/15 1600  NA 131* 132* 134*  K 3.5 3.8 3.8  CL 95* 96* 97*  CO2 24 23 24   GLUCOSE 250* 273* 180*  BUN 56* 37* 32*  CREATININE 7.13* 5.72* 5.58*  CALCIUM 8.1* 8.3* 8.4*  PHOS 3.8 3.8 3.8   Liver Function Tests:  Recent Labs Lab 12/31/14 2138 01/02/15 1615 01/04/15 1600  ALBUMIN 2.7* 2.8* 2.7*   No results for input(s): LIPASE, AMYLASE in the last 168 hours. CBC:  Recent Labs Lab 12/31/14 2138 01/02/15 1614 01/04/15 1600  WBC 14.1* 14.0* 13.4*  HGB 7.7* 7.8* 8.1*  HCT 23.9* 25.3* 26.4*  MCV 99.6 100.4* 101.9*  PLT 265 250 245   Blood Culture    Component Value Date/Time   SDES BLOOD RIGHT HAND 12/18/2014 1215   SPECREQUEST BOTTLES DRAWN AEROBIC AND ANAEROBIC 5CC 12/18/2014 1215   CULT NO GROWTH 5 DAYS 12/18/2014 1215   REPTSTATUS 12/23/2014 FINAL 12/18/2014 1215    Cardiac Enzymes: No results for input(s): CKTOTAL, CKMB, CKMBINDEX, TROPONINI in the last 168 hours. CBG:  Recent Labs Lab 01/04/15 0728 01/04/15 0730 01/04/15 1125 01/04/15 2102 01/05/15 0651  GLUCAP 509* 106* 247* 120* 150*   Iron Studies:  Recent Labs  01/02/15 1614  IRON 60  TIBC 113*   @lablastinr3 @ Studies/Results: No results found. Medications:   .  amiodarone  400 mg Oral Daily  . atorvastatin  40 mg Oral QHS  . calcitRIOL  1.75 mcg Oral Q M,W,F-HD  . colchicine  0.6 mg Oral Daily  . darbepoetin (ARANESP) injection - DIALYSIS  200 mcg Intravenous Q Mon-HD  . feeding supplement (NEPRO CARB STEADY)  237 mL Oral BID BM  . fluocinonide ointment   Topical QID  . guaiFENesin  600 mg Oral BID  . heparin  1,000 Units Intracatheter Once  . heparin  1,000 Units Intracatheter Once  . insulin aspart  0-15 Units Subcutaneous TID WC  . insulin glargine  12 Units Subcutaneous QHS  . metoprolol tartrate  75 mg Oral BID  . multivitamin   1 tablet Oral QHS  . nystatin   Topical BID  . phenytoin  300 mg Oral q morning - 10a  . saccharomyces boulardii  250 mg Oral BID  . warfarin  3 mg Oral q1800  . Warfarin - Pharmacist Dosing Inpatient   Does not apply [phone]

## 2015-01-05 NOTE — Progress Notes (Signed)
01/05/15 1140 nursing Patient discharged to home per wheelchair accompanied by NT and family member. Discharge instructions given to patient. Patient understood re: follow up and home medications.

## 2015-01-05 NOTE — Progress Notes (Signed)
Patient back from HD around 1950, HD nurse assisted pt to recliner chair and set meal tray up. Vitals signs and CBG taken after meal per pt request and stable. Scheduled and hs medications given. Wife at the bedside assisting patient, has concerns of pr pharmacy not being able to fill two of the medication prescriptions written for the patient. Will follow up with MD upon arrival. Cont plan of care. [place], Dione Plover

## 2015-01-05 NOTE — Progress Notes (Signed)
1220 01/05/15 nursing Patient discharged to home per wheelchair  accompanied by NT and wife with 02 per order. Per wife discharge instructions were done yesterday by PA. No further questions.

## 2015-01-05 NOTE — Plan of Care (Signed)
Problem: RH Simple Meal Prep Goal: LTG Patient will perform simple meal prep w/assist (OT) LTG: Patient will perform simple meal prep with assistance, with/without cues (OT).  Outcome: Not Applicable Date Met:  99/23/41 Pt declined to address simple meal prep and states it is no longer focus for him at home.

## 2015-01-07 NOTE — Progress Notes (Signed)
Social Work  Discharge Note  The overall goal for the admission was met for:   Discharge location: Yes - home with wife who can provide 24/7 supervision  Length of Stay: Yes - 10 days  Discharge activity level: Yes - supevision overall  Home/community participation: Yes  Services provided included: MD, RD, PT, OT, SLP, RN, TR, Pharmacy, [place]: Medicare and Private Insurance: [place] (primary)  Follow-up services arranged: Home Health: RN, PT, OT, ST via [employer], DME: 18x18 lightweight w/c, cushion, walker, 3n1 commode, tub bench and oxygen via [place] and Patient/Family has no preference for HH/DME agencies  Comments (or additional information):  Patient/Family verbalized understanding of follow-up arrangements: Yes  Individual responsible for coordination of the follow-up plan: pt  Confirmed correct DME delivered: HOYLE, LUCY 01/05/2015    HOYLE, LUCY

## 2015-01-08 ENCOUNTER — Telehealth: Payer: Self-pay | Admitting: *Deleted

## 2015-01-08 ENCOUNTER — Ambulatory Visit (INDEPENDENT_AMBULATORY_CARE_PROVIDER_SITE_OTHER): Payer: 59 | Admitting: Internal Medicine

## 2015-01-08 DIAGNOSIS — I4819 Other persistent atrial fibrillation: Secondary | ICD-10-CM

## 2015-01-08 DIAGNOSIS — I481 Persistent atrial fibrillation: Secondary | ICD-10-CM

## 2015-01-08 LAB — POCT INR: INR: 1.7

## 2015-01-08 NOTE — Telephone Encounter (Signed)
Reita Chard [facility] called and they have opened case and was calling to verify Dr Naaman Plummer will sign their orders and I have verified with her.

## 2015-01-11 ENCOUNTER — Telehealth: Payer: Self-pay | Admitting: *Deleted

## 2015-01-11 ENCOUNTER — Telehealth: Payer: Self-pay | Admitting: Physical Medicine & Rehabilitation

## 2015-01-11 MED ORDER — INSULIN GLARGINE 100 UNIT/ML ~~LOC~~ SOLN: 17.0000 [IU] | Freq: Every day | SUBCUTANEOUS | Status: DC

## 2015-01-11 NOTE — Telephone Encounter (Signed)
His sugars were in the 100's to occasional low 200's on the same insulin dose while in the hospital so one has to think that dietary compliance may be an issue (he complained of diet in hospital). Are there any signs of infection?  For now may increase lantus to 17u QHS

## 2015-01-11 NOTE — Telephone Encounter (Signed)
No infection that wife is aware of aside from him having had pneumonia.  They will try the 17 units of lantus at night.

## 2015-01-11 NOTE — Telephone Encounter (Signed)
[facility] called to get approval of plan of care 2w6.  Approval given

## 2015-01-11 NOTE — Telephone Encounter (Signed)
I spoke with Joseph [name]'s wife to instruct her to call PCP or whoever was managing blood sugars before.  There is a problem that they will have a new PCP and will not see them until next Thursday. Previously he was taking a higher dose of lantus at night.  His wife is concerned because his sugars are running in the 300's.  For now there is no other MD to direct them.

## 2015-01-11 NOTE — Telephone Encounter (Signed)
Patient was discharged on 9/17 with 12 units of insulin and his blood sugar has been running int he 300's.  Wife would like to know if his insulin could be increased.  Please call her at [phone] Joseph [name]).

## 2015-01-14 ENCOUNTER — Telehealth: Payer: Self-pay | Admitting: Physical Medicine & Rehabilitation

## 2015-01-14 NOTE — Telephone Encounter (Signed)
Joseph [name], OT with [facility] needs verbal order for OT [phone]; please call her at [phone].

## 2015-01-14 NOTE — Telephone Encounter (Signed)
Please call and give the verbal and document it in Epic

## 2015-01-15 ENCOUNTER — Ambulatory Visit (INDEPENDENT_AMBULATORY_CARE_PROVIDER_SITE_OTHER): Payer: 59 | Admitting: Cardiovascular Disease

## 2015-01-15 DIAGNOSIS — I4819 Other persistent atrial fibrillation: Secondary | ICD-10-CM

## 2015-01-15 DIAGNOSIS — I481 Persistent atrial fibrillation: Secondary | ICD-10-CM

## 2015-01-15 LAB — POCT INR: INR: 1.6

## 2015-01-22 ENCOUNTER — Ambulatory Visit (INDEPENDENT_AMBULATORY_CARE_PROVIDER_SITE_OTHER): Payer: 59 | Admitting: Cardiovascular Disease

## 2015-01-22 DIAGNOSIS — I481 Persistent atrial fibrillation: Secondary | ICD-10-CM

## 2015-01-22 DIAGNOSIS — I4819 Other persistent atrial fibrillation: Secondary | ICD-10-CM

## 2015-01-22 LAB — POCT INR: INR: 1.6

## 2015-01-24 ENCOUNTER — Encounter: Payer: Self-pay | Admitting: Neurology

## 2015-01-24 ENCOUNTER — Ambulatory Visit (INDEPENDENT_AMBULATORY_CARE_PROVIDER_SITE_OTHER): Payer: 59 | Admitting: Neurology

## 2015-01-24 VITALS — BP 109/68 | HR 99 | Ht 72.0 in | Wt 164.0 lb

## 2015-01-24 DIAGNOSIS — R569 Unspecified convulsions: Secondary | ICD-10-CM

## 2015-01-24 DIAGNOSIS — I481 Persistent atrial fibrillation: Secondary | ICD-10-CM

## 2015-01-24 DIAGNOSIS — E785 Hyperlipidemia, unspecified: Secondary | ICD-10-CM | POA: Diagnosis not present

## 2015-01-24 DIAGNOSIS — G0481 Other encephalitis and encephalomyelitis: Secondary | ICD-10-CM

## 2015-01-24 DIAGNOSIS — I4819 Other persistent atrial fibrillation: Secondary | ICD-10-CM

## 2015-01-24 MED ORDER — LEVETIRACETAM 500 MG PO TABS: ORAL_TABLET | ORAL | Status: DC

## 2015-01-24 NOTE — Progress Notes (Signed)
PATIENT: Joseph [name] DOB: [birth year]  Chief Complaint  Patient presents with  . Seizures    He is here with his wife, Joseph [name], to have his seizures evaluated.  Reports having his first known seizure while at the hospital, in the MRI machine, in August 2016. He was placed on Dilantin 300mg  at bedtime.  He has not had any further seizure activity.     HISTORICAL  Joseph [name] is a 59 years old right-handed male accompanied by his wife,, seen in refer by his primary care physician Dr. Shirline Frees for seizure January 24 2015   He has past medical history of hypertension, hyperlipidemia, insulin-dependent diabetes, previous heavy alcohol use, end stage renal disease, hemodialysis since March 08 2015, atrial fibrillation, on chronic Coumadin treatment.   He has developed skin rash since April 2016, was treated with antibiotics and steroid by dermatologist, developed C. difficile was treated with 2 weeks course of vancomycin, had hospital admission in July 18-22 2016 for acute on chronic congestive heart failure, severe pulmonary hypertension, suspected congestion hepatitis  He was brought back to hospital November 18 2014 by his wife, was found to be confused at home, not feeling well, upon admission, blood glucose level was 1006, diabetic ketoacidosis, Anion gap 17, CO2 21. He had a quick shift of his blood glucose level, August second, blood glucose decreased to 438, then dropped to 87 next morning.  He remained confused during his hospital stay,, CAT scan of the brain showed no significant abnormality, had to repeat MRI of the brain  I have personally reviewed MRI of the brain in August 3rd, 8th, 22nd,  Persistent abnormal T2/FLAIR signal intensity involving the mesial temporal lobes/hippocampi bilaterally, with question of trace diffusion abnormality within the right hippocampus. While these findings appear overall improved relative to previous MRI from 11/21/2014, the T2/FLAIR signal  abnormality appears slightly worsened relative to prior MRI from 11/26/2014 (although the diffusion changes are improved from that exam as well). 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 2. No other acute intracranial process. 3. Atrophy with chronic microvascular ischemic disease and remote left PCA territory infarct  He was diagnosed with limbic encephalitis, treated with 5 days of Solu-Medrol with improvement mental status, he was also started on acyclovir and underwent LP the following day which showed 1 WBC, RBC 38 in tube 1--> 0 in tube 4. HSV and VZV PCR's were negative. CSF culture was negative. EBV DNA was - CMV quant was low + at 135 copies. RPR and HIV negative. CSF Lyme, and paraneoplastic panel was negative.   He was seen by infectious disease, consider infectious etiology was less likely, he remained confused, eventually had plasma exchange from end of August to early September, he only showed mild improvement,  He is now back home, was started on Dilantin, 300 mg every night, ambulate with a walker, recent flareup of left knee gout, complains of severe left knee pain, use home oxygen, with diagnosis of severe pulmonary hypertension.  He is not back to his baseline, in July 2016, he was functioning independently, driving, take care of the household without much difficulty, now he is confused, described frequent dejavu phenomenon," like I has been there done that just 10 minutes before". There was no clinical seizure   REVIEW OF SYSTEMS: Full 14 system review of systems performed and notable only for weight loss, swelling in legs, rash, itching, blurry vision, shortness of breath,  urination problem, easy bleeding, feeling cold, increased thirst, joints pain, joint swelling, achy muscles, skin sensitivity,   ALLERGIES: Allergies  Allergen Reactions  . Keflex [Cephalexin] Other (See Comments)     c-diff reaction  . Dilaudid [Hydromorphone] Nausea And Vomiting    HOME MEDICATIONS: Current Outpatient Prescriptions  Medication Sig Dispense Refill  . amiodarone (PACERONE) 400 MG tablet Take 1 tablet (400 mg total) by mouth daily. 30 tablet 0  . atorvastatin (LIPITOR) 40 MG tablet Take 1 tablet (40 mg total) by mouth at bedtime. 30 tablet 1  . calcitRIOL (ROCALTROL) 0.25 MCG capsule Take 7 capsules (1.75 mcg total) by mouth every Monday, Wednesday, and Friday with hemodialysis.    Marland Kitchen colchicine 0.6 MG tablet Take 1 tablet (0.6 mg total) by mouth daily. 30 tablet 1  . insulin glargine (LANTUS) 100 UNIT/ML injection Inject 0.17 mLs (17 Units total) into the skin at bedtime. 10 mL 11  . Metoprolol Tartrate 75 MG TABS Take 75 mg by mouth 2 (two) times daily. 60 tablet 0  . multivitamin (RENA-VIT) TABS tablet Take 1 tablet by mouth at bedtime. 30 tablet 0  . naphazoline-pheniramine (NAPHCON-A) 0.025-0.3 % ophthalmic solution Place 2 drops into both eyes 4 (four) times daily as needed for irritation. 15 mL 0  . phenytoin (DILANTIN) 300 MG ER capsule Take 1 capsule (300 mg total) by mouth at bedtime. 30 capsule 1  . warfarin (COUMADIN) 3 MG tablet Take 1 tablet (3 mg total) by mouth daily at 6 PM. 30 tablet 1     PAST MEDICAL HISTORY: Past Medical History  Diagnosis Date  . Atrial fibrillation ([place])     a. 11/18/2011 s/p DCCV - 150J;  b. Anticoagulation w/ Apixaban;  c. 11/2011 back in afib->asymptomatic.  . H/O alcohol abuse     a. drinks heavily on the weekends.  . Hypertension   . Diabetes mellitus ([place])   . Heart murmur     a. 09/2011 Echo: EF 55-60%, Triv AI, Mild MR, mildly dil LA.  . Diabetic peripheral neuropathy ([place])   . CKD (chronic kidney disease), stage III   . CHF (congestive heart failure) ([place])   . Pneumonia   . Sleep apnea   . Seizure ([place])   . Chronic kidney disease requiring chronic dialysis ([place])     3 times weekly    PAST SURGICAL HISTORY: Past Surgical History    Procedure Laterality Date  . Eye sx  11/11/2011    left eye  . Cardioversion  11/18/2011    Procedure: CARDIOVERSION;  Surgeon: Josue Hector, MD;  Location: [facility] ENDOSCOPY;  Service: Cardiovascular;  Laterality: N/A;  . Colonoscopy w/ biopsies and polypectomy    . Av fistula placement Left 02/13/2014    Procedure: INSERTION OF ARTERIOVENOUS (AV) GORE-TEX GRAFT ARM;  Surgeon: Angelia Mould, MD;  Location: [facility];  Service: Vascular;  Laterality: Left;  . Right heart catheterization N/A 10/11/2013    Procedure: RIGHT HEART CATH;  Surgeon: Larey Dresser, MD;  Location: [facility] C[facility] LAB;  Service: Cardiovascular;  Laterality: N/A;    FAMILY HISTORY: Family History  Problem Relation Age of Onset  . Heart disease Mother     before age 56  . Diabetes Father   . Diabetes Sister   . Peripheral vascular disease Sister     amputation  . Cancer Neg Hx   . Stroke Neg Hx     SOCIAL HISTORY:  Social History   Social History  . Marital Status:  Married    Spouse Name: N/A  . Number of Children: 0  . Years of Education: 14   Occupational History  . Disabled    Social History Main Topics  . Smoking status: Former Smoker -- 0.01 packs/day for 5 years    Types: Cigarettes    Quit date: 04/20/1994  . Smokeless tobacco: Never Used  . Alcohol Use: No     Comment: Former use  . Drug Use: No  . Sexual Activity: Yes   Other Topics Concern  . Not on file   Social History Narrative   Lives at home with his wife.   Right-handed.   No caffeine use.     PHYSICAL EXAM   Filed Vitals:   01/24/15 0853  BP: 109/68  Pulse: 99  Height: 6' (1.829 m)  Weight: 164 lb (74.39 kg)    Not recorded      Body mass index is 22.24 kg/(m^2).  PHYSICAL EXAMNIATION:  Gen: NAD, conversant, well nourised, obese, well groomed                     Cardiovascular: Regular rate rhythm, no peripheral edema, warm, nontender. Eyes: Conjunctivae clear without exudates or hemorrhage Neck: Supple, no  carotid bruise. Pulmonary: Clear to auscultation bilaterally   NEUROLOGICAL EXAM:  MENTAL STATUS: Speech:    Speech is normal; fluent and spontaneous with normal comprehension.  Cognition:     Orientation to time, place and person     Normal recent and remote memory     Normal Attention span and concentration     Normal Language, naming, repeating,spontaneous speech     Fund of knowledge   CRANIAL NERVES: CN II: Visual fields are full to confrontation. Pupils are round equal and briskly reactive to light. CN III, IV, VI: extraocular movement are normal. No ptosis. CN V: Facial sensation is intact to pinprick in all 3 divisions bilaterally. Corneal responses are intact.  CN VII: Face is symmetric with normal eye closure and smile. CN VIII: Hearing is normal to rubbing fingers CN IX, X: Palate elevates symmetrically. Phonation is normal. CN XI: Head turning and shoulder shrug are intact CN XII: Tongue is midline with normal movements and no atrophy.  MOTOR: There is no pronator drift of out-stretched arms. Muscle bulk and tone are normal. Muscle strength is normal. Limited because of left knee pain  REFLEXES: Reflexes are 2+ and symmetric at the biceps, triceps, knees, and ankles. Plantar responses are flexor.  SENSORY: Length dependent decreased to light touch, pinprick, and vibration sensation to midshin level  COORDINATION: Rapid alternating movements and fine finger movements are intact. There is no dysmetria on finger-to-nose and heel-knee-shin.    GAIT/STANCE: Rely on his walker to get up from seated position, antalgic, dragging his left leg  DIAGNOSTIC DATA (LABS, IMAGING, TESTING) - I reviewed patient records, labs, notes, testing and imaging myself where available.  ASSESSMENT AND PLAN  FERNANDO STOIBER is a 59 y.o. 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 on MRI of the brain  He is taking Coumadin for his atrial  fibrillation  Dilantin can potentially intact with his Coumadin, will switch to Keppra 500 mg twice a day, 250 mg of after each dialysis, he has hemodialysis on Monday Wednesday Friday  EEG  I wrote out a plan for him to tapering off Dilantin within one week Limbic encephalopathy  Etiology includes metabolic disarrangement introduced encephalopathy, with his rapid blood glucose  level shift, also includes inflammatory, no evidence of infectious etiology  Continue tight control of his glucose level  Return to clinic in one month  Marcial Pacas, M.D. Ph.D.  [facility] [address] [place], [place] [address] Ph: [phone] Fax: [phone]  CC: Shirline Frees, MD

## 2015-01-29 ENCOUNTER — Telehealth: Payer: Self-pay | Admitting: *Deleted

## 2015-01-29 NOTE — Telephone Encounter (Signed)
Nelly Rout, RN, with [facility] called to inform us that she was unable to obtain an INR on the patient.  She stated she stuck him twice and her Coag machine kept reading error. She stated the patient refuses to let her try again and that he declines a INR recheck.  Gave her an order to check the patient's INR on tomorrow (01/30/15).  Instructed her to get a new Coag machine before checking the patient's INR and call with INR results.

## 2015-01-30 LAB — PROTIME-INR: INR: 1.8 — AB (ref 0.9–1.1)

## 2015-01-31 ENCOUNTER — Encounter (HOSPITAL_COMMUNITY): Payer: Self-pay

## 2015-01-31 ENCOUNTER — Ambulatory Visit (HOSPITAL_COMMUNITY)
Admission: RE | Admit: 2015-01-31 | Discharge: 2015-01-31 | Disposition: A | Discharge status: Home / Self Care | Payer: 59 | Source: Ambulatory Visit | Attending: Cardiology | Admitting: Cardiology

## 2015-01-31 VITALS — BP 112/64 | HR 87 | Wt 163.5 lb

## 2015-01-31 DIAGNOSIS — Z9981 Dependence on supplemental oxygen: Secondary | ICD-10-CM | POA: Diagnosis not present

## 2015-01-31 DIAGNOSIS — Z87891 Personal history of nicotine dependence: Secondary | ICD-10-CM | POA: Insufficient documentation

## 2015-01-31 DIAGNOSIS — I482 Chronic atrial fibrillation: Secondary | ICD-10-CM

## 2015-01-31 DIAGNOSIS — J9610 Chronic respiratory failure, unspecified whether with hypoxia or hypercapnia: Secondary | ICD-10-CM | POA: Insufficient documentation

## 2015-01-31 DIAGNOSIS — I4891 Unspecified atrial fibrillation: Secondary | ICD-10-CM

## 2015-01-31 DIAGNOSIS — N186 End stage renal disease: Secondary | ICD-10-CM | POA: Insufficient documentation

## 2015-01-31 DIAGNOSIS — Z79899 Other long term (current) drug therapy: Secondary | ICD-10-CM | POA: Insufficient documentation

## 2015-01-31 DIAGNOSIS — I4821 Permanent atrial fibrillation: Secondary | ICD-10-CM

## 2015-01-31 DIAGNOSIS — E1122 Type 2 diabetes mellitus with diabetic chronic kidney disease: Secondary | ICD-10-CM | POA: Diagnosis not present

## 2015-01-31 DIAGNOSIS — I5032 Chronic diastolic (congestive) heart failure: Secondary | ICD-10-CM | POA: Insufficient documentation

## 2015-01-31 DIAGNOSIS — Z794 Long term (current) use of insulin: Secondary | ICD-10-CM | POA: Diagnosis not present

## 2015-01-31 DIAGNOSIS — Z7901 Long term (current) use of anticoagulants: Secondary | ICD-10-CM | POA: Insufficient documentation

## 2015-01-31 DIAGNOSIS — I120 Hypertensive chronic kidney disease with stage 5 chronic kidney disease or end stage renal disease: Secondary | ICD-10-CM | POA: Insufficient documentation

## 2015-01-31 DIAGNOSIS — Z992 Dependence on renal dialysis: Secondary | ICD-10-CM | POA: Insufficient documentation

## 2015-01-31 DIAGNOSIS — I272 Other secondary pulmonary hypertension: Secondary | ICD-10-CM | POA: Diagnosis not present

## 2015-01-31 DIAGNOSIS — Z841 Family history of disorders of kidney and ureter: Secondary | ICD-10-CM | POA: Insufficient documentation

## 2015-01-31 MED ORDER — METOPROLOL TARTRATE 75 MG PO TABS: 75.0000 mg | ORAL_TABLET | Freq: Two times a day (BID) | ORAL | Status: DC

## 2015-01-31 MED ORDER — AMIODARONE HCL 200 MG PO TABS: 200.0000 mg | ORAL_TABLET | Freq: Every day | ORAL | Status: DC

## 2015-01-31 MED ORDER — WARFARIN SODIUM 3 MG PO TABS: 3.0000 mg | ORAL_TABLET | Freq: Every day | ORAL | Status: DC

## 2015-01-31 NOTE — Patient Instructions (Addendum)
Decrease Amiodarone to 200 mg daily, we have sent you in a new prescription for 200 mg tablets  Your physician recommends that you schedule a follow-up appointment in: 2-3 months with echocardiogram

## 2015-01-31 NOTE — Progress Notes (Signed)
ADVANCED HF CLINIC NOTE  Patient ID: Joseph [name], male   DOB: 11-Jul-1955, 59 y.o.   MRN: [id number] Referring MD: Dr. Caryl Comes Nephrology: Dr Florene Glen  CHF: Aundra Dubin  59 yo with history of ESRD, chronic atrial fibrillation, and chronic diastolic CHF was referred to CHF clinic for evaluation of CHF and elevated PA pressure on echo.  Patient has had renal failure, probably due to HTN and diabetes.  He follows with Dr. Florene Glen.  He has been in atrial fibrillation since 6/13 when he failed a cardioversion and it was decided to leave him in atrial fibrillation.  He is now on coumadin.  Echo in 4/15 showed EF 68-12% with PA systolic pressure 85 mmHg.   RHC in 6/15 showing elevated right and left heart filling pressures with moderate pulmonary hypertension, primarily pulmonary venous hypertension.  He has been [facility] at [facility] for possible Renal transplant but told he is not a candidate due to high PA pressures.   He was admitted in 11/15 with acute on chronic diastolic CHF and AKI on CKD. He was started on oxygen.  He was started on hemodialysis for volume removal at that time.    Had recent admission (7/31 to 8/14) for encephalopathy with limbic encephalitis.Then readmitted 8/21-9/17 for PNA, sepsis and hemoptysis.Situation c/b rapid AF and started on amio for rate control. Went to CIR discharged home on 9/17.   Now at home. Feeling stronger. Still having memory problems though. Tolerating HD well. Weight stable. No orthopnea, PND or edema.    Labs (8/14): K 4.4, creatinine 3.7 Labs (6/15): K 4.5, creatinine 4.88 Labs (10/30/13) : K 4.1 Creatinine 4.22 Labs (01/15/14): K 3.8 creatinine 5.57 Labs (12/15): HCT 30.9  PMH: 1. CKD: Followed by Dr. Florene Glen, likely due to HTN and diabetes. Progression to ESRD in 11/15.  2. Atrial fibrillation: Chronic.  DCCV attempted in 6/13 but reverted to atrial fibrillation.  Rate control since that time.     -started on amio during admit in 8/16. NSR by ECG 01/31/15 3. H/o  heavy ETOH, now does not drink. 4. Type II diabetes 5. HTN 6. Chronic diastolic CHF with elevated PA pressure: Echo (4/15) with EF 55-60%, mild MR, PA systolic pressure 85 mmHg, RV normal per report. RHC (6/15) with mean RA 12, PA 68/28 mean 44, mean PCWP 23 with prominent v-waves, CI 3.5, PVR 2.8 WU.   SH: On disability, prior smoker but quit, prior heavy ETOH but quit, lives in [place].  FH: Mother with valve replacement, sister with ESRD.   ROS: All systems reviewed and negative except as per HPI.   Current Outpatient Prescriptions  Medication Sig Dispense Refill  . amiodarone (PACERONE) 400 MG tablet Take 1 tablet (400 mg total) by mouth daily. 30 tablet 0  . atorvastatin (LIPITOR) 40 MG tablet Take 1 tablet (40 mg total) by mouth at bedtime. 30 tablet 1  . calcitRIOL (ROCALTROL) 0.25 MCG capsule Take 7 capsules (1.75 mcg total) by mouth every Monday, Wednesday, and Friday with hemodialysis.    Marland Kitchen colchicine 0.6 MG tablet Take 1 tablet (0.6 mg total) by mouth daily. 30 tablet 1  . insulin glargine (LANTUS) 100 UNIT/ML injection Inject 0.17 mLs (17 Units total) into the skin at bedtime. 10 mL 11  . levETIRAcetam (KEPPRA) 500 MG tablet One tab twice a day, 1/2 tab extra after each dialysis 90 tablet 6  . Metoprolol Tartrate 75 MG TABS Take 75 mg by mouth 2 (two) times daily. 60 tablet 0  . multivitamin (  RENA-VIT) TABS tablet Take 1 tablet by mouth at bedtime. 30 tablet 0  . naphazoline-pheniramine (NAPHCON-A) 0.025-0.3 % ophthalmic solution Place 2 drops into both eyes 4 (four) times daily as needed for irritation. 15 mL 0  . warfarin (COUMADIN) 3 MG tablet Take 1 tablet (3 mg total) by mouth daily at 6 PM. 30 tablet 1   No current facility-administered medications for this encounter.    BP 112/64 mmHg  Pulse 87  Wt 163 lb 8 oz (74.163 kg)  SpO2 90% General: NAD Dyspnea at rest Neck: Supple JVP flat.  no thyromegaly or thyroid nodule.  Lungs: CTAB Cardiac: PMI normal Regular  S1S2, no S3S4. 2/6 TR  no ankle edema.  No carotid bruit.  Normal pedal pulses.  Abdomen: Soft, nontender, no hepatosplenomegaly, nondistended.  Skin: Intact without lesions or rashes.  Neurologic: Alert and oriented x 3.  Psych: Normal affect. Extremities: No clubbing or cyanosis. L wrist fistula  ECG: NSR 83   Assessment/Plan: 1. Chronic diastolic CHF:  - Volume status well controlled with HD 2. ESRD  - Follwed by Dr. Florene Glen 3. Atrial fibrillation: Previously felt to be chronic. Now in NSR on amio. Decrease to 200 daily. Continue warfarin.  4. Pulmonary HTN -mixed picture. Currently not candidate for renal transplant due to [facility]. Will repeat echo in 2-3 months.  5. Chronic respiratory failure -on home O2. Ambulatory sats 88% today.     Bensimhon, Daniel,MD 01/31/2015

## 2015-02-01 ENCOUNTER — Ambulatory Visit (INDEPENDENT_AMBULATORY_CARE_PROVIDER_SITE_OTHER): Payer: 59 | Admitting: Cardiology

## 2015-02-01 DIAGNOSIS — I482 Chronic atrial fibrillation: Secondary | ICD-10-CM

## 2015-02-01 DIAGNOSIS — I4821 Permanent atrial fibrillation: Secondary | ICD-10-CM

## 2015-02-05 ENCOUNTER — Telehealth: Payer: Self-pay | Admitting: *Deleted

## 2015-02-05 NOTE — Telephone Encounter (Signed)
Pt's wife called stating that she forgot in tell nurse that he is on Levocetirizine 5mg  daily for itching .Informed will add to his medication list on next visit

## 2015-02-07 ENCOUNTER — Ambulatory Visit (INDEPENDENT_AMBULATORY_CARE_PROVIDER_SITE_OTHER): Payer: 59

## 2015-02-07 DIAGNOSIS — I482 Chronic atrial fibrillation, unspecified: Secondary | ICD-10-CM

## 2015-02-07 DIAGNOSIS — I4821 Permanent atrial fibrillation: Secondary | ICD-10-CM

## 2015-02-07 LAB — POCT INR: INR: 1.2

## 2015-02-12 ENCOUNTER — Ambulatory Visit: Payer: 59 | Admitting: Podiatry

## 2015-02-12 ENCOUNTER — Telehealth: Payer: Self-pay | Admitting: Neurology

## 2015-02-12 NOTE — Telephone Encounter (Signed)
They would prefer a Tuesday appointment date, as late in the afternoon in possible.  I will contact the hospital to schedule.

## 2015-02-12 NOTE — Telephone Encounter (Signed)
Pt's wife called regarding r/s EEG to January 2017. She does not want pt to wait that long for EEG. He has appt for f/u on 11/8, he was to have EEG prior to that appt also. Can he have EEG elsewhere? Please call and advise at [phone]

## 2015-02-13 NOTE — Telephone Encounter (Signed)
EEG  [facility] called and appt time is changed to 1p. May call [phone], Baxter Flattery. Please leave message. She has meeting at 130 but will call back.

## 2015-02-13 NOTE — Telephone Encounter (Signed)
EEG scheduled for 02/19/15 at 2pm.  Called patient and she requested an earlier appt for that particular Tuesday.  Called [facility] back [phone]) - had to leave a message for a return call.

## 2015-02-14 ENCOUNTER — Other Ambulatory Visit: Payer: Medicare Other

## 2015-02-14 ENCOUNTER — Ambulatory Visit (INDEPENDENT_AMBULATORY_CARE_PROVIDER_SITE_OTHER): Payer: 59 | Admitting: *Deleted

## 2015-02-14 DIAGNOSIS — I4891 Unspecified atrial fibrillation: Secondary | ICD-10-CM

## 2015-02-14 DIAGNOSIS — I482 Chronic atrial fibrillation, unspecified: Secondary | ICD-10-CM

## 2015-02-14 DIAGNOSIS — I4821 Permanent atrial fibrillation: Secondary | ICD-10-CM

## 2015-02-14 LAB — POCT INR: INR: 1.5

## 2015-02-14 MED ORDER — WARFARIN SODIUM 3 MG PO TABS: ORAL_TABLET | ORAL | Status: DC

## 2015-02-14 NOTE — Telephone Encounter (Signed)
Joseph [name] unable to offer earlier appt - spoke to patient's wife and he will keep the 1pm appt on 02/19/15.  Informed them to arrive at 12:45 at admissions (located through the new entrance of the hospital on the [place] side) - also provided them with the direct phone number.

## 2015-02-14 NOTE — Telephone Encounter (Signed)
Spoke to patient's wife, she is now requesting a morning appt time.  Left Baxter Flattery a message, at [facility], to call me back with a new appt time.  I will call the patient, once I get the information.

## 2015-02-15 ENCOUNTER — Telehealth: Payer: Self-pay | Admitting: Physical Medicine & Rehabilitation

## 2015-02-15 DIAGNOSIS — E1165 Type 2 diabetes mellitus with hyperglycemia: Secondary | ICD-10-CM | POA: Diagnosis not present

## 2015-02-15 DIAGNOSIS — E1122 Type 2 diabetes mellitus with diabetic chronic kidney disease: Secondary | ICD-10-CM | POA: Diagnosis not present

## 2015-02-15 DIAGNOSIS — I132 Hypertensive heart and chronic kidney disease with heart failure and with stage 5 chronic kidney disease, or end stage renal disease: Secondary | ICD-10-CM | POA: Diagnosis not present

## 2015-02-15 DIAGNOSIS — I482 Chronic atrial fibrillation: Secondary | ICD-10-CM

## 2015-02-15 DIAGNOSIS — I509 Heart failure, unspecified: Secondary | ICD-10-CM | POA: Diagnosis not present

## 2015-02-15 DIAGNOSIS — N186 End stage renal disease: Secondary | ICD-10-CM

## 2015-02-15 NOTE — Telephone Encounter (Signed)
LVM for Joseph [name] at [facility] PT with verbal approval to extend PT for 1 more week to finish breather per Joseph [name]

## 2015-02-19 ENCOUNTER — Ambulatory Visit (HOSPITAL_COMMUNITY)
Admission: RE | Admit: 2015-02-19 | Discharge: 2015-02-19 | Disposition: A | Discharge status: Home / Self Care | Payer: 59 | Source: Ambulatory Visit | Attending: Neurology | Admitting: Neurology

## 2015-02-19 DIAGNOSIS — E1122 Type 2 diabetes mellitus with diabetic chronic kidney disease: Secondary | ICD-10-CM | POA: Insufficient documentation

## 2015-02-19 DIAGNOSIS — I4819 Other persistent atrial fibrillation: Secondary | ICD-10-CM

## 2015-02-19 DIAGNOSIS — G40909 Epilepsy, unspecified, not intractable, without status epilepticus: Secondary | ICD-10-CM

## 2015-02-19 DIAGNOSIS — I120 Hypertensive chronic kidney disease with stage 5 chronic kidney disease or end stage renal disease: Secondary | ICD-10-CM | POA: Diagnosis not present

## 2015-02-19 DIAGNOSIS — G0481 Other encephalitis and encephalomyelitis: Secondary | ICD-10-CM | POA: Diagnosis not present

## 2015-02-19 DIAGNOSIS — E785 Hyperlipidemia, unspecified: Secondary | ICD-10-CM | POA: Diagnosis not present

## 2015-02-19 DIAGNOSIS — N186 End stage renal disease: Secondary | ICD-10-CM | POA: Diagnosis not present

## 2015-02-19 DIAGNOSIS — Z992 Dependence on renal dialysis: Secondary | ICD-10-CM | POA: Diagnosis not present

## 2015-02-19 DIAGNOSIS — R569 Unspecified convulsions: Secondary | ICD-10-CM

## 2015-02-19 DIAGNOSIS — Z7901 Long term (current) use of anticoagulants: Secondary | ICD-10-CM | POA: Insufficient documentation

## 2015-02-19 NOTE — Procedures (Signed)
ELECTROENCEPHALOGRAM REPORT  Patient: Joseph [name]       Room #: OP EEG No. ID: [id number] Age: 59 y.o.        Sex: male Referring Physician: Roderic Ovens Report Date:  02/19/2015        Interpreting Physician: Anthony Sar  History: [name] is an 59 y.o. male with a history of atrial fibrillation on Coumadin, hypertension, diabetes mellitus, end-stage renal disease on dialysis and onset of seizure disorder in August 2016. MRI showed bilateral mesial temporal sclerosis. Patient reportedly also has not returned to baseline with respect to activity and possibly cognitive functioning, as well.  Indications for study:  Rule out right to left temporal lobe focus, as well as rule out encephalopathy.  Technique: This is an 18 channel routine scalp EEG performed at the bedside with bipolar and monopolar montages arranged in accordance to the international 10/20 system of electrode placement.   Description: This EEG recording was performed during wakefulness and during sleep. Predominant activity during wakefulness consisted of 10  Hz alpha rhythm with good attenuation with eye opening. Photic stimulation was not performed.There was symmetrical slowing of background activity with mixed irregular delta and theta activity diffusely during sleep. Symmetrical vertex waves, sleep spindles and K-complexes are recorded during stage II of sleep. No epileptiform discharges were recorded. There was no abnormal slowing of cerebral activity.  Interpretation: This is a normal EEG recording during wakefulness and during sleep. No evidence of an epileptic disorder was demonstrated. However, a normal EEG recording in and of itself does not rule out seizure disorder.   Rush Farmer M.D. [facility] [phone]

## 2015-02-19 NOTE — Progress Notes (Signed)
EEG completed; results pending.    

## 2015-02-21 ENCOUNTER — Ambulatory Visit (INDEPENDENT_AMBULATORY_CARE_PROVIDER_SITE_OTHER): Payer: 59 | Admitting: Podiatry

## 2015-02-21 ENCOUNTER — Encounter: Payer: Self-pay | Admitting: Podiatry

## 2015-02-21 DIAGNOSIS — M79674 Pain in right toe(s): Secondary | ICD-10-CM

## 2015-02-21 DIAGNOSIS — E1149 Type 2 diabetes mellitus with other diabetic neurological complication: Secondary | ICD-10-CM

## 2015-02-21 DIAGNOSIS — M79676 Pain in unspecified toe(s): Secondary | ICD-10-CM

## 2015-02-21 DIAGNOSIS — B351 Tinea unguium: Secondary | ICD-10-CM

## 2015-02-21 DIAGNOSIS — M79675 Pain in left toe(s): Secondary | ICD-10-CM | POA: Diagnosis not present

## 2015-02-21 DIAGNOSIS — E1140 Type 2 diabetes mellitus with diabetic neuropathy, unspecified: Secondary | ICD-10-CM

## 2015-02-21 NOTE — Progress Notes (Signed)
Patient ID: Joseph [name], male   DOB: [birth year], 59 y.o.   MRN: [id number] Complaint:  Visit Type: Patient returns to my office for continued preventative foot care services. Complaint: Patient states" my nails have grown long and thick and become painful to walk and wear shoes" Patient has been diagnosed with DM with neuropathy. He presents for preventative foot care services. No changes to ROS.  He has been hospitalized and developed multiple areas on the top of his foot which drained in the hospital.  Podiatric Exam: Vascular: dorsalis pedis and posterior tibial pulses are palpable bilateral. Capillary return is immediate. Temperature gradient is WNL. Skin turgor WNL  Sensorium: Diminished Semmes Weinstein monofilament test. Normal tactile sensation bilaterally. Nail Exam: Pt has thick disfigured discolored nails with subungual debris noted bilateral entire nail hallux through fifth toenails.   Ulcer Exam: There is no evidence of ulcer or pre-ulcerative changes or infection. Orthopedic Exam: Muscle tone and strength are WNL. No limitations in general ROM. No crepitus or effusions noted. Foot type and digits show no abnormalities. Bony prominences are unremarkable. Skin: No Porokeratosis. No infection or ulcers.    No signs of redness or swelling.  Diagnosis:  Tinea unguium, Pain in right toe, pain in left toes  Treatment & Plan Procedures and Treatment: Consent by patient was obtained for treatment procedures. The patient understood the discussion of treatment and procedures well. All questions were answered thoroughly reviewed. Debridement of mycotic and hypertrophic toenails, 1 through 5 bilateral and clearing of subungual debris. No ulceration, no infection noted.  Return Visit-Office Procedure: Patient instructed to return to the office for a follow up visit 3 months for continued evaluation and treatment.

## 2015-02-26 ENCOUNTER — Encounter: Payer: Self-pay | Admitting: Neurology

## 2015-02-26 ENCOUNTER — Ambulatory Visit (INDEPENDENT_AMBULATORY_CARE_PROVIDER_SITE_OTHER): Payer: 59 | Admitting: Neurology

## 2015-02-26 ENCOUNTER — Ambulatory Visit (INDEPENDENT_AMBULATORY_CARE_PROVIDER_SITE_OTHER): Payer: 59 | Admitting: *Deleted

## 2015-02-26 VITALS — BP 113/78 | HR 92 | Ht 72.0 in | Wt 169.0 lb

## 2015-02-26 DIAGNOSIS — I482 Chronic atrial fibrillation, unspecified: Secondary | ICD-10-CM

## 2015-02-26 DIAGNOSIS — R569 Unspecified convulsions: Secondary | ICD-10-CM | POA: Diagnosis not present

## 2015-02-26 DIAGNOSIS — I4821 Permanent atrial fibrillation: Secondary | ICD-10-CM

## 2015-02-26 DIAGNOSIS — G0490 Encephalitis and encephalomyelitis, unspecified: Secondary | ICD-10-CM

## 2015-02-26 LAB — POCT INR: INR: 1.9

## 2015-02-26 NOTE — Progress Notes (Signed)
Chief Complaint  Patient presents with  . Seizures    He is here with his wife, Joseph [name].  No further seizure activity reported with the switch to Keppra.  He does feel the medication makes him tired.  They would like to discuss his EEG results.      PATIENT: Joseph [name] DOB: 1955-08-04  Chief Complaint  Patient presents with  . Seizures    He is here with his wife, Joseph [name].  No further seizure activity reported with the switch to Keppra.  He does feel the medication makes him tired.  They would like to discuss his EEG results.     HISTORICAL  Joseph [name] is a 59 years old right-handed male accompanied by his wife,, seen in refer by his primary care physician Dr. Shirline Frees for seizure January 24 2015   He has past medical history of hypertension, hyperlipidemia, insulin-dependent diabetes, previous heavy alcohol use, end stage renal disease, hemodialysis since March 08 2015, atrial fibrillation, on chronic Coumadin treatment.   He has developed skin rash since April 2016, was treated with antibiotics and steroid by dermatologist, developed C. difficile was treated with 2 weeks course of vancomycin, had hospital admission in July 18-22 2016 for acute on chronic congestive heart failure, severe pulmonary hypertension, suspected congestion hepatitis  He was brought back to hospital November 18 2014 by his wife, was found to be confused at home, not feeling well, upon admission, blood glucose level was 1006, diabetic ketoacidosis, Anion gap 17, CO2 21. He had a quick shift of his blood glucose level, August second, blood glucose decreased to 438, then dropped to 87 next morning.  He remained confused during his hospital stay,, CAT scan of the brain showed no significant abnormality, had to repeat MRI of the brain  I have personally reviewed MRI of the brain in August 3rd, 8th, 22nd,  Persistent abnormal T2/FLAIR signal intensity involving the mesial temporal lobes/hippocampi bilaterally,  with question of trace diffusion abnormality within the right hippocampus. While these findings appear overall improved relative to previous MRI from 11/21/2014, the T2/FLAIR signal abnormality appears slightly worsened relative to prior MRI from 11/26/2014 (although the diffusion changes are improved from that exam as well). 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 2. No other acute intracranial process. 3. Atrophy with chronic microvascular ischemic disease and remote left PCA territory infarct  He was diagnosed with limbic encephalitis, treated with 5 days of Solu-Medrol with improvement mental status, he was also started on acyclovir and underwent LP the following day which showed 1 WBC, RBC 38 in tube 1--> 0 in tube 4. HSV and VZV PCR's were negative. CSF culture was negative. EBV DNA was - CMV quant was low + at 135 copies. RPR and HIV negative. CSF Lyme, and paraneoplastic panel was negative.   He was seen by infectious disease, consider infectious etiology was less likely, he remained confused, eventually had plasma exchange from end of August to early September, he only showed mild improvement,  He is now back home, was started on Dilantin, 300 mg every night, ambulate with a walker, recent flareup of left knee gout, complains of severe left knee pain, use home oxygen, with diagnosis of severe pulmonary hypertension.  He is not back to his baseline, in July 2016, he was functioning independently, driving, take care of the household without much difficulty, now he is confused, described frequent dejavu phenomenon," like I has been  there done that just 10 minutes before". There was no clinical seizure  UPDATE Feb 26 2015: He is now taking keppra 500mg  bid, 500/750mg  on the dialysis day MWF, he does not have seizure like activities, he still has intermittent confusion, short term memory trouble, his diabetes is  under good control, he is off dilantin now, He complains of poor vision, has cataract will have surgery in December 2016,  He walks with a cane, continue to use oxygen, he also complains of short-term memory trouble   REVIEW OF SYSTEMS: Full 14 system review of systems performed and notable only for blurry vision, daytime sleepiness, confusion   ALLERGIES: Allergies  Allergen Reactions  . Keflex [Cephalexin] Other (See Comments)    c-diff reaction  . Dilaudid [Hydromorphone] Nausea And Vomiting    HOME MEDICATIONS: Current Outpatient Prescriptions  Medication Sig Dispense Refill  . amiodarone (PACERONE) 400 MG tablet Take 1 tablet (400 mg total) by mouth daily. 30 tablet 0  . atorvastatin (LIPITOR) 40 MG tablet Take 1 tablet (40 mg total) by mouth at bedtime. 30 tablet 1  . calcitRIOL (ROCALTROL) 0.25 MCG capsule Take 7 capsules (1.75 mcg total) by mouth every Monday, Wednesday, and Friday with hemodialysis.    Marland Kitchen colchicine 0.6 MG tablet Take 1 tablet (0.6 mg total) by mouth daily. 30 tablet 1  . insulin glargine (LANTUS) 100 UNIT/ML injection Inject 0.17 mLs (17 Units total) into the skin at bedtime. 10 mL 11  . Metoprolol Tartrate 75 MG TABS Take 75 mg by mouth 2 (two) times daily. 60 tablet 0  . multivitamin (RENA-VIT) TABS tablet Take 1 tablet by mouth at bedtime. 30 tablet 0  . naphazoline-pheniramine (NAPHCON-A) 0.025-0.3 % ophthalmic solution Place 2 drops into both eyes 4 (four) times daily as needed for irritation. 15 mL 0  . phenytoin (DILANTIN) 300 MG ER capsule Take 1 capsule (300 mg total) by mouth at bedtime. 30 capsule 1  . warfarin (COUMADIN) 3 MG tablet Take 1 tablet (3 mg total) by mouth daily at 6 PM. 30 tablet 1     PAST MEDICAL HISTORY: Past Medical History  Diagnosis Date  . Atrial fibrillation ([place])     a. 11/18/2011 s/p DCCV - 150J;  b. Anticoagulation w/ Apixaban;  c. 11/2011 back in afib->asymptomatic.  . H/O alcohol abuse     a. drinks heavily on the  weekends.  . Hypertension   . Diabetes mellitus ([place])   . Heart murmur     a. 09/2011 Echo: EF 55-60%, Triv AI, Mild MR, mildly dil LA.  . Diabetic peripheral neuropathy ([place])   . CKD (chronic kidney disease), stage III   . CHF (congestive heart failure) ([place])   . Pneumonia   . Sleep apnea   . Seizure ([place])   . Chronic kidney disease requiring chronic dialysis ([place])     3 times weekly    PAST SURGICAL HISTORY: Past Surgical History  Procedure Laterality Date  . Eye sx  11/11/2011    left eye  . Cardioversion  11/18/2011    Procedure: CARDIOVERSION;  Surgeon: Josue Hector, MD;  Location: [facility] ENDOSCOPY;  Service: Cardiovascular;  Laterality: N/A;  . Colonoscopy w/ biopsies and polypectomy    . Av fistula placement Left 02/13/2014    Procedure: INSERTION OF ARTERIOVENOUS (AV) GORE-TEX GRAFT ARM;  Surgeon: Angelia Mould, MD;  Location: [place];  Service: Vascular;  Laterality: Left;  . Right heart catheterization N/A 10/11/2013    Procedure: RIGHT HEART CATH;  Surgeon: Larey Dresser, MD;  Location: [facility] CATH LAB;  Service: Cardiovascular;  Laterality: N/A;    FAMILY HISTORY: Family History  Problem Relation Age of Onset  . Heart disease Mother     before age 74  . Diabetes Father   . Diabetes Sister   . Peripheral vascular disease Sister     amputation  . Cancer Neg Hx   . Stroke Neg Hx     SOCIAL HISTORY:  Social History   Social History  . Marital Status: Married    Spouse Name: N/A  . Number of Children: 0  . Years of Education: 14   Occupational History  . Disabled    Social History Main Topics  . Smoking status: Former Smoker -- 0.01 packs/day for 5 years    Types: Cigarettes    Quit date: 04/20/1994  . Smokeless tobacco: Never Used  . Alcohol Use: No     Comment: Former use  . Drug Use: No  . Sexual Activity: Yes   Other Topics Concern  . Not on file   Social History Narrative   Lives at home with his wife.   Right-handed.   No caffeine use.      PHYSICAL EXAM   Filed Vitals:   02/26/15 1631  BP: 113/78  Pulse: 92  Height: 6' (1.829 m)  Weight: 169 lb (76.658 kg)    Not recorded      Body mass index is 22.92 kg/(m^2).  PHYSICAL EXAMNIATION:  Gen: NAD, conversant, well nourised, obese, well groomed                     Cardiovascular: Regular rate rhythm, no peripheral edema, warm, nontender. Eyes: Conjunctivae clear without exudates or hemorrhage Neck: Supple, no carotid bruise. Pulmonary: Clear to auscultation bilaterally   NEUROLOGICAL EXAM:  MENTAL STATUS: Speech:    Speech is normal; fluent and spontaneous with normal comprehension.  Cognition:     Orientation to time, place and person     Normal recent and remote memory     Normal Attention span and concentration     Normal Language, naming, repeating,spontaneous speech     Fund of knowledge   CRANIAL NERVES: CN II: Visual fields are full to confrontation. Pupils are round equal and briskly reactive to light. CN III, IV, VI: extraocular movement are normal. No ptosis. CN V: Facial sensation is intact to pinprick in all 3 divisions bilaterally. Corneal responses are intact.  CN VII: Face is symmetric with normal eye closure and smile. CN VIII: Hearing is normal to rubbing fingers CN IX, X: Palate elevates symmetrically. Phonation is normal. CN XI: Head turning and shoulder shrug are intact CN XII: Tongue is midline with normal movements and no atrophy.  MOTOR: There is no pronator drift of out-stretched arms. Muscle bulk and tone are normal. Muscle strength is normal. Limited because of left knee pain  REFLEXES: Reflexes are 2+ and symmetric at the biceps, triceps, knees, and ankles. Plantar responses are flexor.  SENSORY: Length dependent decreased to light touch, pinprick, and vibration sensation to midshin level  COORDINATION: Rapid alternating movements and fine finger movements are intact. There is no dysmetria on finger-to-nose and  heel-knee-shin.    GAIT/STANCE: Mildly unsteady, wide-based, cautious gait   ASSESSMENT AND PLAN  PAT SIRES is a 59 y.o. 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 Ph.D.  [facility] [address], [place], [place] [address] Ph: [phone] Fax: [phone]  CC: Shirline Frees, MD

## 2015-03-04 ENCOUNTER — Telehealth: Payer: Self-pay | Admitting: Physical Medicine & Rehabilitation

## 2015-03-04 DIAGNOSIS — G9340 Encephalopathy, unspecified: Secondary | ICD-10-CM

## 2015-03-04 NOTE — Telephone Encounter (Signed)
[place] with [facility] would like an order for patient to start outpatient therapy at our Neuro location.  Patient has completed home heath and she would like him to start at [facility].  Any questions please call her at [phone].

## 2015-03-04 NOTE — Telephone Encounter (Signed)
Called back to clarify what modalities they wanted on the order, confirmed PT and ST

## 2015-03-04 NOTE — Telephone Encounter (Signed)
Lmtcb. mah 

## 2015-03-06 ENCOUNTER — Telehealth (HOSPITAL_COMMUNITY): Payer: Self-pay | Admitting: *Deleted

## 2015-03-06 NOTE — Telephone Encounter (Signed)
Pt's wife left a VM at 3:08 pm stating pt's BP has been running constantly low, around [id number] systolic.  She is concerned that pt is on both amio and metoprolol and stated she has been holding his metoprolol for past few days.    Attempted to call her back and Left message to call back

## 2015-03-07 ENCOUNTER — Ambulatory Visit (INDEPENDENT_AMBULATORY_CARE_PROVIDER_SITE_OTHER): Payer: 59 | Admitting: Surgery

## 2015-03-07 DIAGNOSIS — I482 Chronic atrial fibrillation, unspecified: Secondary | ICD-10-CM

## 2015-03-07 DIAGNOSIS — I4821 Permanent atrial fibrillation: Secondary | ICD-10-CM

## 2015-03-07 LAB — POCT INR: INR: 2.2

## 2015-03-08 ENCOUNTER — Other Ambulatory Visit (HOSPITAL_COMMUNITY): Payer: Self-pay

## 2015-03-08 MED ORDER — METOPROLOL TARTRATE 75 MG PO TABS: 75.0000 mg | ORAL_TABLET | Freq: Two times a day (BID) | ORAL | Status: DC

## 2015-03-08 NOTE — Telephone Encounter (Signed)
Wife has held his Metoprolol Tartrate 75mg  bid for 3 days because his blood pressure was running in the [id number] systolic Today his blood pressure was back up to 132/80 and gave him his regular metoprolol tartrate dose this morning. Advised wife [name] to reduce the 75 mg twice a day to 1/2;  One half of a 75 mg twice a day until hear differently She will keep watch on his blood pressure and call us if it stays low, < 90 systolic or > [id number] systolic. Dialysis Monday, Wed and Friday.  Wife said his BP drops very low after dialysis but stays that way.  On dialysis day she gives him only one 75 mg metoprolol  Patient sees Dr. Joelyn Oms for dialysis and has seen both Dr. Haroldine Laws and Dr. Aundra Dubin Routed to all for advise

## 2015-03-11 NOTE — Telephone Encounter (Signed)
   Wife called this morning.  Said she has been giving him Metoprolol 75 mg instead of twice a day and his blood pressure over the weekend was running 112/80  This morning prior to dialysis it is 140/91  Questions from wife:  1. Are they pulling off too much fluid with dialysis causing his blood pressure to stay low? 2. Do you want him to stay on Metoprolol 75 mg twice a day or reduce his dose?  Routed to Dr. Joelyn Oms (dialysis) and Dr. Haroldine Laws (CHF clinic)

## 2015-03-12 NOTE — Telephone Encounter (Signed)
Called wife and let her know that Dr. Joelyn Oms will be evaluating when he sees him tomorrow

## 2015-03-21 ENCOUNTER — Ambulatory Visit (INDEPENDENT_AMBULATORY_CARE_PROVIDER_SITE_OTHER): Payer: 59 | Admitting: *Deleted

## 2015-03-21 DIAGNOSIS — I482 Chronic atrial fibrillation, unspecified: Secondary | ICD-10-CM

## 2015-03-21 DIAGNOSIS — I4821 Permanent atrial fibrillation: Secondary | ICD-10-CM

## 2015-03-21 LAB — POCT INR: INR: 2

## 2015-04-02 ENCOUNTER — Ambulatory Visit (HOSPITAL_COMMUNITY)
Admission: RE | Admit: 2015-04-02 | Discharge: 2015-04-02 | Disposition: A | Discharge status: Home / Self Care | Payer: 59 | Source: Ambulatory Visit | Attending: Family Medicine | Admitting: Family Medicine

## 2015-04-02 ENCOUNTER — Encounter (HOSPITAL_COMMUNITY): Payer: Self-pay | Admitting: Internal Medicine

## 2015-04-02 ENCOUNTER — Ambulatory Visit (HOSPITAL_BASED_OUTPATIENT_CLINIC_OR_DEPARTMENT_OTHER)
Admission: RE | Admit: 2015-04-02 | Discharge: 2015-04-02 | Disposition: A | Discharge status: Home / Self Care | Payer: 59 | Source: Ambulatory Visit | Attending: Internal Medicine | Admitting: Internal Medicine

## 2015-04-02 ENCOUNTER — Ambulatory Visit (INDEPENDENT_AMBULATORY_CARE_PROVIDER_SITE_OTHER): Payer: 59 | Admitting: *Deleted

## 2015-04-02 VITALS — BP 114/68 | HR 89 | Wt 183.8 lb

## 2015-04-02 DIAGNOSIS — I272 Other secondary pulmonary hypertension: Secondary | ICD-10-CM | POA: Insufficient documentation

## 2015-04-02 DIAGNOSIS — I482 Chronic atrial fibrillation, unspecified: Secondary | ICD-10-CM

## 2015-04-02 DIAGNOSIS — N186 End stage renal disease: Secondary | ICD-10-CM

## 2015-04-02 DIAGNOSIS — I509 Heart failure, unspecified: Secondary | ICD-10-CM | POA: Diagnosis present

## 2015-04-02 DIAGNOSIS — I517 Cardiomegaly: Secondary | ICD-10-CM | POA: Diagnosis not present

## 2015-04-02 DIAGNOSIS — I480 Paroxysmal atrial fibrillation: Secondary | ICD-10-CM | POA: Diagnosis not present

## 2015-04-02 DIAGNOSIS — I059 Rheumatic mitral valve disease, unspecified: Secondary | ICD-10-CM | POA: Insufficient documentation

## 2015-04-02 DIAGNOSIS — R9431 Abnormal electrocardiogram [ECG] [EKG]: Secondary | ICD-10-CM | POA: Diagnosis not present

## 2015-04-02 DIAGNOSIS — R000 Tachycardia, unspecified: Secondary | ICD-10-CM | POA: Insufficient documentation

## 2015-04-02 DIAGNOSIS — I5032 Chronic diastolic (congestive) heart failure: Secondary | ICD-10-CM

## 2015-04-02 DIAGNOSIS — I4821 Permanent atrial fibrillation: Secondary | ICD-10-CM

## 2015-04-02 DIAGNOSIS — Z992 Dependence on renal dialysis: Secondary | ICD-10-CM

## 2015-04-02 LAB — POCT INR: INR: 2.4

## 2015-04-02 MED ORDER — METOPROLOL TARTRATE 50 MG PO TABS: 50.0000 mg | ORAL_TABLET | Freq: Two times a day (BID) | ORAL | Status: DC

## 2015-04-02 NOTE — Progress Notes (Signed)
ADVANCED HF CLINIC NOTE  Patient ID: Joseph [name], male   DOB: [birth year], 59 y.o.   MRN: [id number] Referring MD: Dr. Caryl Comes Nephrology: Dr Florene Glen  CHF: Aundra Dubin  59 yo with history of ESRD, chronic atrial fibrillation, and chronic diastolic CHF was referred to CHF clinic for evaluation of CHF and elevated PA pressure on echo.  Patient has had renal failure, probably due to HTN and diabetes.  He follows with Dr. Florene Glen.  He has been in atrial fibrillation since 6/13 when he failed a cardioversion and it was decided to leave him in atrial fibrillation.  He is now on coumadin.  Echo in 4/15 showed EF 0000000 with PA systolic pressure 85 mmHg.   RHC in 6/15 showing elevated right and left heart filling pressures with moderate pulmonary hypertension, primarily pulmonary venous hypertension.  He has been [facility] at [facility] for possible Renal transplant but told he is not a candidate due to high PA pressures.   He was admitted in 11/15 with acute on chronic diastolic CHF and AKI on CKD. He was started on oxygen.  He was started on hemodialysis for volume removal at that time.    Had recent admission (7/31 to 8/14) for encephalopathy with limbic encephalitis.Then readmitted 8/21-9/17 for PNA, sepsis and hemoptysis.Situation c/b rapid AF and started on amio for rate control. Went to CIR discharged home on 9/17.   Doing well. Getting HD on M/W/F. Has been feeling better and much more active. Appetite improved. More active. No significant exertional dyspnea.  Tolerating HD well. Weight stable. No orthopnea, PND or edema. Getting tired of wearing O2. BP low at HD at times. Wife concerned about his memory loss.   Labs (8/14): K 4.4, creatinine 3.7 Labs (6/15): K 4.5, creatinine 4.88 Labs (10/30/13) : K 4.1 Creatinine 4.22 Labs (01/15/14): K 3.8 creatinine 5.57 Labs (12/15): HCT 30.9  PMH: 1. CKD: Followed by Dr. Florene Glen, likely due to HTN and diabetes. Progression to ESRD in 11/15.  2. Atrial fibrillation: Chronic.   DCCV attempted in 6/13 but reverted to atrial fibrillation.  Rate control since that time.     -started on amio during admit in 8/16. NSR by ECG 01/31/15 3. H/o heavy ETOH, now does not drink. 4. Type II diabetes 5. HTN 6. Chronic diastolic CHF with elevated PA pressure: Echo (4/15) with EF 55-60%, mild MR, PA systolic pressure 85 mmHg, RV normal per report. RHC (6/15) with mean RA 12, PA 68/28 mean 44, mean PCWP 23 with prominent v-waves, CI 3.5, PVR 2.8 WU.   SH: On disability, prior smoker but quit, prior heavy ETOH but quit, lives in [place].  FH: Mother with valve replacement, sister with ESRD.   ROS: All systems reviewed and negative except as per HPI.   Current Outpatient Prescriptions  Medication Sig Dispense Refill  . amiodarone (PACERONE) 200 MG tablet Take 1 tablet (200 mg total) by mouth daily. 30 tablet 6  . atorvastatin (LIPITOR) 40 MG tablet Take 1 tablet (40 mg total) by mouth at bedtime. 30 tablet 1  . calcitRIOL (ROCALTROL) 0.25 MCG capsule Take 7 capsules (1.75 mcg total) by mouth every Monday, Wednesday, and Friday with hemodialysis.    Marland Kitchen colchicine 0.6 MG tablet Take 1 tablet (0.6 mg total) by mouth daily. 30 tablet 1  . insulin glargine (LANTUS) 100 UNIT/ML injection Inject 0.17 mLs (17 Units total) into the skin at bedtime. 10 mL 11  . levETIRAcetam (KEPPRA) 500 MG tablet One tab twice a day, 1/2 tab  extra after each dialysis 90 tablet 6  . Metoprolol Tartrate 75 MG TABS Take 75 mg by mouth 2 (two) times daily. 60 tablet 3  . multivitamin (RENA-VIT) TABS tablet Take 1 tablet by mouth at bedtime. 30 tablet 0  . warfarin (COUMADIN) 3 MG tablet Take as directed by coumadin clinic 65 tablet 3   No current facility-administered medications for this encounter.    BP 114/68 mmHg  Pulse 89  Wt 183 lb 12 oz (83.348 kg)  SpO2 98% General: NAD Dyspnea at rest Neck: Supple JVP flat.  no thyromegaly or thyroid nodule.  Lungs: CTAB Cardiac: PMI normal Regular S1S2, no  S3S4. 2/6 TR  no ankle edema.  No carotid bruit.  Normal pedal pulses.  Abdomen: Soft, nontender, no hepatosplenomegaly, nondistended.  Skin: Intact without lesions or rashes.  Neurologic: Alert and oriented x 3.  Psych: Normal affect. Extremities: No clubbing or cyanosis. L wrist fistula  ECG: NSR 83   Assessment/Plan: 1. Chronic diastolic CHF:  - Volume status well controlled with HD 2. ESRD  - Follwed by Dr. Florene Glen 3. Atrial fibrillation: Previously felt to be chronic. Now in NSR on amio. Decrease to 200 daily. Continue warfarin.  4. Pulmonary HTN -Mixed picture. NYHA Class II symptoms Currently not candidate for renal transplant due to [facility]. I reviewed echo images personally today LVEF 50% RV mildly HK. PA pressure only 38 mm HG now with volume control on HD. 5. Chronic respiratory failure -on home O2   Bensimhon, Daniel,MD 04/02/2015

## 2015-04-02 NOTE — Patient Instructions (Addendum)
DECREASE Metoprolol to 50mg  twice a day. Hold on dialysis days.  FOLLOW UP: 3 months  Your provider requests you have a Pulmonary Function Test.  Pulmonary Function Tests Pulmonary function tests (PFTs) measure how well your lungs are working. The tests can help to identify the causes of lung problems. They can also help your health care provider select the best treatment for you. Your health care provider may order pulmonary function for any of the following reasons:  When an illness involving the lungs is suspected.  To follow changes in your lung function over time if you are known to have a chronic lung disease.  For industrial plant workers to examine the effects of being exposed to chemicals over a long period of time.  To assess lung function prior to surgery or other procedures.  For people who are smokers. Your measured lung function will be compared to the expected lung function of someone with healthy lungs who is similar to you in age, gender, size, and other factors. This is used to determine your "percent predicted" lung function, which is how your health care provider knows if your lung function is normal or abnormal. If you have had prior pulmonary function testing performed, your health care provider will also compare your current results with past tests to see if your lung function is better, worse, or staying the same. This can sometimes be useful to see if treatments are working.

## 2015-04-02 NOTE — Progress Notes (Signed)
  Echocardiogram 2D Echocardiogram has been performed.  Joseph [name] 04/02/2015, 9:50 AM

## 2015-04-05 ENCOUNTER — Telehealth (HOSPITAL_COMMUNITY): Payer: Self-pay | Admitting: Cardiology

## 2015-04-05 DIAGNOSIS — I5022 Chronic systolic (congestive) heart failure: Secondary | ICD-10-CM

## 2015-04-05 NOTE — Telephone Encounter (Signed)
Patient unable to get labs requested at dialysis facilty Pt will need to return for labs, unable to return on M,W,F Advised labs days here in the CHF clinic are M and F am, will place order to have drawn at [facility]

## 2015-04-12 DIAGNOSIS — I480 Paroxysmal atrial fibrillation: Secondary | ICD-10-CM | POA: Insufficient documentation

## 2015-04-17 ENCOUNTER — Other Ambulatory Visit: Payer: Self-pay | Admitting: *Deleted

## 2015-04-17 DIAGNOSIS — I5022 Chronic systolic (congestive) heart failure: Secondary | ICD-10-CM

## 2015-04-18 ENCOUNTER — Ambulatory Visit (INDEPENDENT_AMBULATORY_CARE_PROVIDER_SITE_OTHER): Payer: 59 | Admitting: *Deleted

## 2015-04-18 ENCOUNTER — Encounter (HOSPITAL_COMMUNITY): Payer: 59

## 2015-04-18 ENCOUNTER — Other Ambulatory Visit (INDEPENDENT_AMBULATORY_CARE_PROVIDER_SITE_OTHER): Payer: 59 | Admitting: *Deleted

## 2015-04-18 ENCOUNTER — Telehealth (HOSPITAL_COMMUNITY): Payer: Self-pay | Admitting: Cardiology

## 2015-04-18 ENCOUNTER — Ambulatory Visit (HOSPITAL_COMMUNITY)
Admission: RE | Admit: 2015-04-18 | Discharge: 2015-04-18 | Disposition: A | Discharge status: Home / Self Care | Payer: 59 | Source: Ambulatory Visit | Attending: Internal Medicine | Admitting: Internal Medicine

## 2015-04-18 DIAGNOSIS — I482 Chronic atrial fibrillation, unspecified: Secondary | ICD-10-CM

## 2015-04-18 DIAGNOSIS — R05 Cough: Secondary | ICD-10-CM | POA: Diagnosis not present

## 2015-04-18 DIAGNOSIS — I5032 Chronic diastolic (congestive) heart failure: Secondary | ICD-10-CM | POA: Diagnosis present

## 2015-04-18 DIAGNOSIS — I5022 Chronic systolic (congestive) heart failure: Secondary | ICD-10-CM | POA: Diagnosis not present

## 2015-04-18 DIAGNOSIS — I10 Essential (primary) hypertension: Secondary | ICD-10-CM

## 2015-04-18 DIAGNOSIS — I4821 Permanent atrial fibrillation: Secondary | ICD-10-CM

## 2015-04-18 LAB — PULMONARY FUNCTION TEST
DL/VA % pred: 53 %
DL/VA: 2.46 ml/min/mmHg/L
DLCO UNC % PRED: 32 %
DLCO UNC: 10.56 ml/min/mmHg
FEF 25-75 PRE: 1.51 L/s
FEF 25-75 Post: 2.15 L/sec
FEF2575-%Change-Post: 42 %
FEF2575-%Pred-Post: 74 %
FEF2575-%Pred-Pre: 51 %
FEV1-%CHANGE-POST: 10 %
FEV1-%PRED-POST: 60 %
FEV1-%PRED-PRE: 54 %
FEV1-POST: 1.91 L
FEV1-Pre: 1.72 L
FEV1FVC-%Change-Post: -2 %
FEV1FVC-%Pred-Pre: 101 %
FEV6-%Change-Post: 13 %
FEV6-%PRED-POST: 63 %
FEV6-%PRED-PRE: 56 %
FEV6-POST: 2.48 L
FEV6-Pre: 2.18 L
FEV6FVC-%PRED-POST: 104 %
FEV6FVC-%Pred-Pre: 104 %
FVC-%Change-Post: 13 %
FVC-%PRED-PRE: 54 %
FVC-%Pred-Post: 61 %
FVC-POST: 2.48 L
FVC-PRE: 2.18 L
POST FEV6/FVC RATIO: 100 %
PRE FEV6/FVC RATIO: 100 %
Post FEV1/FVC ratio: 77 %
Pre FEV1/FVC ratio: 79 %
RV % PRED: 160 %
RV: 3.6 L
TLC % PRED: 88 %
TLC: 6.22 L

## 2015-04-18 LAB — POCT INR: INR: 2.7

## 2015-04-18 LAB — TSH: TSH: 2.019 u[IU]/mL (ref 0.350–4.500)

## 2015-04-18 MED ORDER — ALBUTEROL SULFATE (2.5 MG/3ML) 0.083% IN NEBU
2.5000 mg | INHALATION_SOLUTION | Freq: Once | RESPIRATORY_TRACT | Status: AC
  Administered 2015-04-18: 2.5 mg via RESPIRATORY_TRACT

## 2015-04-18 NOTE — Addendum Note (Signed)
Addended by: Domenica Reamer R on: 04/18/2015 10:43 AM   Modules accepted: Orders

## 2015-04-18 NOTE — Telephone Encounter (Signed)
Needs to come in for evaluation, ideally tomorrow.

## 2015-04-18 NOTE — Telephone Encounter (Signed)
Attempted to add patient onto schedule foe 04/19/15, pt would like appointment arranged with wife  SPOKE WITH WIFE, PATIENT DOES HAVE DIALYSIS ON 04/09/15 WILL ADD FIRST APPOINTMENT IN THE AM AND TRY TO HAVE HIM OUT FOR OTHER APPOINTMENT

## 2015-04-18 NOTE — Telephone Encounter (Signed)
Patients wife called concerning increased HR HR at home 134-135 HR during PFT-120  Please advise Most recent OV Dr.Bensimhon decreased Amio to 200 mg daily

## 2015-04-19 ENCOUNTER — Ambulatory Visit (HOSPITAL_COMMUNITY)
Admission: RE | Admit: 2015-04-19 | Discharge: 2015-04-19 | Disposition: A | Discharge status: Home / Self Care | Payer: 59 | Source: Ambulatory Visit | Attending: Cardiology | Admitting: Cardiology

## 2015-04-19 ENCOUNTER — Encounter (HOSPITAL_COMMUNITY): Payer: Self-pay

## 2015-04-19 ENCOUNTER — Other Ambulatory Visit (HOSPITAL_COMMUNITY): Payer: Self-pay

## 2015-04-19 VITALS — BP 130/70 | HR 115 | Resp 20 | Wt 189.2 lb

## 2015-04-19 DIAGNOSIS — I5032 Chronic diastolic (congestive) heart failure: Secondary | ICD-10-CM

## 2015-04-19 DIAGNOSIS — Z9981 Dependence on supplemental oxygen: Secondary | ICD-10-CM | POA: Diagnosis not present

## 2015-04-19 DIAGNOSIS — Z7901 Long term (current) use of anticoagulants: Secondary | ICD-10-CM | POA: Diagnosis not present

## 2015-04-19 DIAGNOSIS — J189 Pneumonia, unspecified organism: Secondary | ICD-10-CM

## 2015-04-19 DIAGNOSIS — I272 Other secondary pulmonary hypertension: Secondary | ICD-10-CM | POA: Diagnosis not present

## 2015-04-19 DIAGNOSIS — E1122 Type 2 diabetes mellitus with diabetic chronic kidney disease: Secondary | ICD-10-CM | POA: Insufficient documentation

## 2015-04-19 DIAGNOSIS — N186 End stage renal disease: Secondary | ICD-10-CM | POA: Diagnosis not present

## 2015-04-19 DIAGNOSIS — I5022 Chronic systolic (congestive) heart failure: Secondary | ICD-10-CM

## 2015-04-19 DIAGNOSIS — Z79899 Other long term (current) drug therapy: Secondary | ICD-10-CM | POA: Diagnosis not present

## 2015-04-19 DIAGNOSIS — R7989 Other specified abnormal findings of blood chemistry: Secondary | ICD-10-CM

## 2015-04-19 DIAGNOSIS — R000 Tachycardia, unspecified: Secondary | ICD-10-CM | POA: Insufficient documentation

## 2015-04-19 DIAGNOSIS — Z87891 Personal history of nicotine dependence: Secondary | ICD-10-CM | POA: Insufficient documentation

## 2015-04-19 DIAGNOSIS — I480 Paroxysmal atrial fibrillation: Secondary | ICD-10-CM | POA: Insufficient documentation

## 2015-04-19 DIAGNOSIS — Z794 Long term (current) use of insulin: Secondary | ICD-10-CM | POA: Diagnosis not present

## 2015-04-19 DIAGNOSIS — E785 Hyperlipidemia, unspecified: Secondary | ICD-10-CM | POA: Diagnosis not present

## 2015-04-19 DIAGNOSIS — J9611 Chronic respiratory failure with hypoxia: Secondary | ICD-10-CM | POA: Insufficient documentation

## 2015-04-19 DIAGNOSIS — I120 Hypertensive chronic kidney disease with stage 5 chronic kidney disease or end stage renal disease: Secondary | ICD-10-CM | POA: Insufficient documentation

## 2015-04-19 DIAGNOSIS — Z9189 Other specified personal risk factors, not elsewhere classified: Secondary | ICD-10-CM

## 2015-04-19 DIAGNOSIS — R945 Abnormal results of liver function studies: Secondary | ICD-10-CM

## 2015-04-19 MED ORDER — METOPROLOL TARTRATE 25 MG PO TABS: ORAL_TABLET | ORAL | Status: DC

## 2015-04-19 NOTE — Patient Instructions (Signed)
Change Metoprolol as follows... Take 25 mg twice daily on dialysis days (Monday, Wednesday, Friday) Take 50 mg twice daily on all other days.  Routine lab work today. Will notify you of abnormal results, otherwise no news is good news!  Will schedule you for chest CT at [facility].  Will refer you to Dr. Lake Bells for pulmonary. Address: [address], [place], [place] [address]  Phone: [phone]  Follow up 2 weeks.

## 2015-04-21 NOTE — Progress Notes (Signed)
Patient ID: Joseph [name], male   DOB: 11/21/1955, 60 y.o.   MRN: [id number]  ADVANCED HF CLINIC NOTE  Patient ID: Joseph [name], male   DOB: 10/21/1955, 60 y.o.   MRN: [id number] Referring MD: Dr. Caryl Comes Nephrology: Dr Florene Glen  CHF: Aundra Dubin  60 yo with history of ESRD, paroxysmal atrial fibrillation, and chronic diastolic CHF was referred to CHF clinic for evaluation of CHF and elevated PA pressure on echo.  Patient has had renal failure, probably due to HTN and diabetes.  He follows with Dr. Florene Glen.  He has been in atrial fibrillation since 6/13 when he failed a cardioversion and it was decided to leave him in atrial fibrillation.  He is now on coumadin.  Echo in 4/15 showed EF 0000000 with PA systolic pressure 85 mmHg.   RHC in 6/15 showing elevated right and left heart filling pressures with moderate pulmonary hypertension, primarily pulmonary venous hypertension.  He has been seen at [facility] for possible Renal transplant but told he is not a candidate due to high PA pressures.   He was admitted in 11/15 with acute on chronic diastolic CHF and AKI on CKD. He was started on oxygen.  He was started on hemodialysis for volume removal at that time.    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 Went to [facility], discharged home on 9/17.   He remains on home oxygen.  PFTs in 12/16 showed moderate-severe obstructive disease with concurrent restriction and decreased DLCO.  Echo in 12/6 showed EF 50-55%, PASP 38 mmHg with mildly decreased RV systolic function.  He can walk about 15-20 minutes now without dyspnea.  He does get short of breath if he walks without oxygen.  No chest pain.  He has been concerned because his HR increased to the 130s on Wednesday at HD and they would not let him leave.  Today, he is in mild sinus tachycardia with rate 107.  At last appointment,  amiodarone was decreased.  He wants to know if he can increase his amiodarone dose up again to keep HR down.  No lightheadedness, he does not feel palpitations.   Labs (8/14): K 4.4, creatinine 3.7 Labs (6/15): K 4.5, creatinine 4.88 Labs (10/30/13) : K 4.1 Creatinine 4.22 Labs (01/15/14): K 3.8 creatinine 5.57 Labs (12/15): HCT 30.9 Labs (12/16): TSH normal  ECG: sinus tachycardia at 107  PMH: 1. CKD: Followed by Dr. Florene Glen, likely due to HTN and diabetes. Progression to ESRD in 11/15.  2. Atrial fibrillation: Paroxysmal.  DCCV attempted in 6/13 but reverted to atrial fibrillation.  Now rhythm control with amiodarone. 3. H/o heavy ETOH, now does not drink. 4. Type II diabetes 5. HTN 6. Chronic diastolic CHF with elevated PA pressure: Echo (4/15) with EF 55-60%, mild MR, PA systolic pressure 85 mmHg, RV normal per report. RHC (6/15) with mean RA 12, PA 68/28 mean 44, mean PCWP 23 with prominent v-waves, CI 3.5, PVR 2.8 WU. Echo (12/16) with EF 50-55%, moderate LVH, normal RV size with mildly decreased systolic function.  PASP 38 mmHg.  7. Chronic hypoxic respiratory failure: He has been on home oxygen since hospitalization with PNA.  PFTs (12/16) with mod-severe obstructive disease suggestive of emphysema but also concurrent restriction and decreased DLCO (30%).     SH: On disability, prior smoker but quit (he was never a heavy smoker), prior heavy ETOH but quit, lives  in [place].  FH: Mother with valve replacement, sister with ESRD.   ROS: All systems reviewed and negative except as per HPI.   Current Outpatient Prescriptions  Medication Sig Dispense Refill  . amiodarone (PACERONE) 200 MG tablet Take 1 tablet (200 mg total) by mouth daily. (Patient taking differently: Take 200 mg by mouth daily with supper. ) 30 tablet 6  . atorvastatin (LIPITOR) 40 MG tablet Take 1 tablet (40 mg total) by mouth at bedtime. 30 tablet 1  . calcitRIOL (ROCALTROL) 0.25 MCG capsule Take 7 capsules (1.75  mcg total) by mouth every Monday, Wednesday, and Friday with hemodialysis.    Marland Kitchen colchicine 0.6 MG tablet Take 0.6 mg by mouth 2 (two) times daily.    . insulin glargine (LANTUS) 100 UNIT/ML injection Inject 0.17 mLs (17 Units total) into the skin at bedtime. (Patient not taking: Reported on 04/19/2015) 10 mL 11  . levETIRAcetam (KEPPRA) 500 MG tablet One tab twice a day, 1/2 tab extra after each dialysis (Patient taking differently: Take 500 mg by mouth See admin instructions. Take 1 tablet (500 mg) by mouth daily, take 1 tablet (500 mg) by mouth Sunday, Tuesday, Thursday, Saturday with supper, take 1 1/2 tablets (750 mg) by mouth on Monday, Wednesday, Friday with supper (dialysis days)) 90 tablet 6  . metoprolol (LOPRESSOR) 25 MG tablet Take 25 mg twice daily on Monday, Wednesday, and Friday (Dialysis days) and 50 mg twice daily on all other days. (Patient taking differently: Take 25 mg by mouth See admin instructions. Take 1 tablet (25 mg) twice daily on Monday, Wednesday, and Friday (Dialysis days) and take 2 tablets (50 mg) twice daily on Sunday, Tuesday, Thursday, Saturday) 100 tablet 6  . multivitamin (RENA-VIT) TABS tablet Take 1 tablet by mouth at bedtime. (Patient taking differently: Take 1 tablet by mouth daily with supper. ) 30 tablet 0  . warfarin (COUMADIN) 3 MG tablet Take as directed by coumadin clinic (Patient taking differently: Take 6 mg by mouth daily at 6 PM. or as directed by coumadin clinic) 65 tablet 3  . acetaminophen (TYLENOL) 500 MG tablet Take 1,000 mg by mouth every 6 (six) hours as needed (pain).    . insulin glargine (LANTUS) 100 unit/mL SOPN Inject 20 Units into the skin daily before supper.    Marland Kitchen ketorolac (ACULAR) 0.5 % ophthalmic solution Place 1 drop into the right eye See admin instructions. Instill 1 drop into right eye 4 times daily starting 72 hours prior to surgery  4  . loperamide (IMODIUM) 1 MG/5ML solution Take 6 mg by mouth 2 (two) times daily as needed for  diarrhea or loose stools.    Marland Kitchen ofloxacin (OCUFLOX) 0.3 % ophthalmic solution Place 1 drop into both eyes See admin instructions. Instill 1 drop into both eyes 4 times daily starting 72 hours prior to surgery  1  . prednisoLONE acetate (PRED FORTE) 1 % ophthalmic suspension Place 1 drop into the right eye See admin instructions. Instill 1 drop into the right eye starting after surgery  1  . sevelamer carbonate (RENVELA) 800 MG tablet Take 800-1,600 mg by mouth See admin instructions. Take 2 tablets (1600 mg) by mouth with meals and 1 tablet (800 mg) with snacks - total of 8 tablets daily     No current facility-administered medications for this encounter.    BP 130/70 mmHg  Pulse 115  Resp 20  Wt 189 lb 4 oz (85.843 kg)  SpO2 98% General: NAD Dyspnea at rest Neck: Supple  JVP flat.  no thyromegaly or thyroid nodule.  Lungs: CTAB Cardiac: PMI normal.  Mildly tachy, regular S1S2, no S3S4. 2/6 TR  no ankle edema.  No carotid bruit.  Normal pedal pulses.  Abdomen: Soft, nontender, no hepatosplenomegaly, nondistended.  Skin: Intact without lesions or rashes.  Neurologic: Alert and oriented x 3.  Psych: Normal affect. Extremities: No clubbing or cyanosis. L wrist fistula  Assessment/Plan: 1. Chronic diastolic CHF: Volume status well controlled with HD at this point. 2. ESRD: MWF HD.  3. Atrial fibrillation: Previously felt to be chronic. However, now in NSR on amiodarone. HR to the 130s recently noted at HD, not sure if this was atrial fibrillation, no ECG. He is in sinus tachycardia at 107 today.   - Continue warfarin. - Can continue amiodarone at 200 mg daily for now, but I am concerned about his PFTs and oxygen need => not sure of the etiology, need to involve pulmonary given risk for amiodarone lung toxicity. TSH 12/16 was normal, need to check LFTs.  - He takes metoprolol 50 mg bid except on HD days when he does not take metoprolol.  I will have him start metoprolol 25 mg bid on HD days.     4. Pulmonary HTN: Primarily pulmonary venous hypertension on last RHC.  This is likely improved now that he is getting HD.  Last echo estimated PASP 38 mmHg.  He was turned down for renal transplant due to pulmonary hypertension. - I will repeat RHC.  Hopefully, PA pressures will be lower and he can be re-considered for transplantation.  - Hold coumadin 3 days prior.  5. Chronic hypoxemic respiratory failure:  On home O2.  Etiology is uncertain.  Not volume related, fluid level looks good now on HD.  PFTs showed moderate-severe obstruction (but he was not a heavy smoker) as well as concurrent restriction and decreased DLCO.   - I will order noncontrast high resolution CT of lungs to look for interstitial lung disease.  - Given amiodarone use, I am concerned for possible toxicity.  I will arrange for pulmonary consultation.  [name] 04/21/2015

## 2015-04-23 ENCOUNTER — Telehealth (HOSPITAL_COMMUNITY): Payer: Self-pay

## 2015-04-23 NOTE — Telephone Encounter (Signed)
Patient's wife called CHF clinic triage line to reschedule RHC with Dr. Aundra Dubin due to schedule conflict with transportation and HD apt. Scheduled for 04/25/15 at 7:30 am. Advised patient's wife of time and to be at short stay entrance for registration at 5:30 am that morning. Reiterated education and prep for this procedure. Aware and agreeable to plan.  Renee Pain

## 2015-04-25 ENCOUNTER — Encounter (HOSPITAL_COMMUNITY)
Admission: RE | Disposition: A | Discharge status: Home / Self Care | Payer: 59 | Source: Ambulatory Visit | Attending: Cardiology

## 2015-04-25 ENCOUNTER — Encounter (HOSPITAL_COMMUNITY): Payer: Self-pay | Admitting: Cardiology

## 2015-04-25 ENCOUNTER — Ambulatory Visit (HOSPITAL_COMMUNITY)
Admission: RE | Admit: 2015-04-25 | Discharge: 2015-04-25 | Disposition: A | Discharge status: Home / Self Care | Payer: 59 | Source: Ambulatory Visit | Attending: Cardiology | Admitting: Cardiology

## 2015-04-25 ENCOUNTER — Telehealth (HOSPITAL_COMMUNITY): Payer: Self-pay | Admitting: Vascular Surgery

## 2015-04-25 DIAGNOSIS — I480 Paroxysmal atrial fibrillation: Secondary | ICD-10-CM | POA: Insufficient documentation

## 2015-04-25 DIAGNOSIS — I5022 Chronic systolic (congestive) heart failure: Secondary | ICD-10-CM

## 2015-04-25 DIAGNOSIS — J9611 Chronic respiratory failure with hypoxia: Secondary | ICD-10-CM | POA: Diagnosis not present

## 2015-04-25 DIAGNOSIS — Z7901 Long term (current) use of anticoagulants: Secondary | ICD-10-CM | POA: Diagnosis not present

## 2015-04-25 DIAGNOSIS — Z9981 Dependence on supplemental oxygen: Secondary | ICD-10-CM | POA: Insufficient documentation

## 2015-04-25 DIAGNOSIS — I132 Hypertensive heart and chronic kidney disease with heart failure and with stage 5 chronic kidney disease, or end stage renal disease: Secondary | ICD-10-CM | POA: Diagnosis not present

## 2015-04-25 DIAGNOSIS — N186 End stage renal disease: Secondary | ICD-10-CM | POA: Diagnosis not present

## 2015-04-25 DIAGNOSIS — I5032 Chronic diastolic (congestive) heart failure: Secondary | ICD-10-CM | POA: Insufficient documentation

## 2015-04-25 DIAGNOSIS — E1122 Type 2 diabetes mellitus with diabetic chronic kidney disease: Secondary | ICD-10-CM | POA: Diagnosis not present

## 2015-04-25 DIAGNOSIS — I272 Other secondary pulmonary hypertension: Secondary | ICD-10-CM | POA: Diagnosis present

## 2015-04-25 DIAGNOSIS — Z87891 Personal history of nicotine dependence: Secondary | ICD-10-CM | POA: Diagnosis not present

## 2015-04-25 DIAGNOSIS — Z992 Dependence on renal dialysis: Secondary | ICD-10-CM | POA: Diagnosis not present

## 2015-04-25 DIAGNOSIS — Z794 Long term (current) use of insulin: Secondary | ICD-10-CM | POA: Diagnosis not present

## 2015-04-25 DIAGNOSIS — I270 Primary pulmonary hypertension: Secondary | ICD-10-CM | POA: Diagnosis not present

## 2015-04-25 HISTORY — PX: CARDIAC CATHETERIZATION: SHX172

## 2015-04-25 LAB — POCT I-STAT 3, VENOUS BLOOD GAS (G3P V)
Acid-Base Excess: 7 mmol/L — ABNORMAL HIGH (ref 0.0–2.0)
BICARBONATE: 32.6 meq/L — AB (ref 20.0–24.0)
O2 SAT: 67 %
PCO2 VEN: 49.5 mmHg (ref 45.0–50.0)
PO2 VEN: 35 mmHg (ref 30.0–45.0)
TCO2: 34 mmol/L (ref 0–100)
pH, Ven: 7.427 — ABNORMAL HIGH (ref 7.250–7.300)

## 2015-04-25 LAB — CBC
HEMATOCRIT: 32.9 % — AB (ref 39.0–52.0)
HEMOGLOBIN: 10.5 g/dL — AB (ref 13.0–17.0)
MCH: 29.7 pg (ref 26.0–34.0)
MCHC: 31.9 g/dL (ref 30.0–36.0)
MCV: 92.9 fL (ref 78.0–100.0)
Platelets: 154 10*3/uL (ref 150–400)
RBC: 3.54 MIL/uL — ABNORMAL LOW (ref 4.22–5.81)
RDW: 15.3 % (ref 11.5–15.5)
WBC: 7.5 10*3/uL (ref 4.0–10.5)

## 2015-04-25 LAB — BASIC METABOLIC PANEL
ANION GAP: 11 (ref 5–15)
BUN: 40 mg/dL — ABNORMAL HIGH (ref 6–20)
CHLORIDE: 97 mmol/L — AB (ref 101–111)
CO2: 32 mmol/L (ref 22–32)
Calcium: 9 mg/dL (ref 8.9–10.3)
Creatinine, Ser: 6.47 mg/dL — ABNORMAL HIGH (ref 0.61–1.24)
GFR calc Af Amer: 10 mL/min — ABNORMAL LOW (ref >60)
GFR, EST NON AFRICAN AMERICAN: 8 mL/min — AB (ref >60)
GLUCOSE: 139 mg/dL — AB (ref 65–99)
POTASSIUM: 4.1 mmol/L (ref 3.5–5.1)
Sodium: 140 mmol/L (ref 135–145)

## 2015-04-25 LAB — GLUCOSE, CAPILLARY
Glucose-Capillary: 109 mg/dL — ABNORMAL HIGH (ref 65–99)
Glucose-Capillary: 121 mg/dL — ABNORMAL HIGH (ref 65–99)

## 2015-04-25 LAB — PROTIME-INR
INR: 2.19 — AB (ref 0.00–1.49)
Prothrombin Time: 24.1 seconds — ABNORMAL HIGH (ref 11.6–15.2)

## 2015-04-25 SURGERY — RIGHT HEART CATH
Anesthesia: LOCAL

## 2015-04-25 MED ORDER — SODIUM CHLORIDE 0.9 % IV SOLN: 250.0000 mL | INTRAVENOUS | Status: DC | PRN

## 2015-04-25 MED ORDER — SODIUM CHLORIDE 0.9 % IJ SOLN: 3.0000 mL | Freq: Two times a day (BID) | INTRAMUSCULAR | Status: DC

## 2015-04-25 MED ORDER — MIDAZOLAM HCL 2 MG/2ML IJ SOLN
INTRAMUSCULAR | Status: AC
  Filled 2015-04-25: qty 2

## 2015-04-25 MED ORDER — FENTANYL CITRATE (PF) 100 MCG/2ML IJ SOLN
INTRAMUSCULAR | Status: AC
  Filled 2015-04-25: qty 2

## 2015-04-25 MED ORDER — SODIUM CHLORIDE 0.9 % IJ SOLN: 3.0000 mL | INTRAMUSCULAR | Status: DC | PRN

## 2015-04-25 MED ORDER — SODIUM CHLORIDE 0.9 % IV SOLN: INTRAVENOUS | Status: DC

## 2015-04-25 MED ORDER — SODIUM CHLORIDE 0.9 % IV SOLN
INTRAVENOUS | Status: DC
  Administered 2015-04-25: 06:00:00 via INTRAVENOUS

## 2015-04-25 MED ORDER — ONDANSETRON HCL 4 MG/2ML IJ SOLN: 4.0000 mg | Freq: Four times a day (QID) | INTRAMUSCULAR | Status: DC | PRN

## 2015-04-25 MED ORDER — INSULIN ASPART 100 UNIT/ML ~~LOC~~ SOLN
0.0000 [IU] | SUBCUTANEOUS | Status: DC
  Filled 2015-04-25: qty 0.09

## 2015-04-25 MED ORDER — FENTANYL CITRATE (PF) 100 MCG/2ML IJ SOLN
INTRAMUSCULAR | Status: DC | PRN
  Administered 2015-04-25: 25 ug via INTRAVENOUS

## 2015-04-25 MED ORDER — ACETAMINOPHEN 325 MG PO TABS: 650.0000 mg | ORAL_TABLET | ORAL | Status: DC | PRN

## 2015-04-25 MED ORDER — ASPIRIN 81 MG PO CHEW: 324.0000 mg | CHEWABLE_TABLET | ORAL | Status: DC

## 2015-04-25 MED ORDER — LIDOCAINE HCL (PF) 1 % IJ SOLN
INTRAMUSCULAR | Status: AC
  Filled 2015-04-25: qty 30

## 2015-04-25 MED ORDER — MIDAZOLAM HCL 2 MG/2ML IJ SOLN
INTRAMUSCULAR | Status: DC | PRN
  Administered 2015-04-25: 1 mg via INTRAVENOUS

## 2015-04-25 SURGICAL SUPPLY — 5 items
CATH SWAN GANZ 7F STRAIGHT (CATHETERS) ×1 IMPLANT
PACK CARDIAC CATHETERIZATION (CUSTOM PROCEDURE TRAY) ×2 IMPLANT
SHEATH PINNACLE 7F 10CM (SHEATH) ×2 IMPLANT
TRANSDUCER W/STOPCOCK (MISCELLANEOUS) ×2 IMPLANT
WIRE EMERALD 3MM-J .035X150CM (WIRE) ×2 IMPLANT

## 2015-04-25 NOTE — H&P (View-Only) (Signed)
Patient ID: Joseph [name], male   DOB: May 21, 1955, 60 y.o.   MRN: [id number]  ADVANCED HF CLINIC NOTE  Patient ID: Joseph [name], male   DOB: 12-19-1955, 60 y.o.   MRN: [id number] Referring MD: Dr. Caryl Comes Nephrology: Dr Florene Glen  CHF: Aundra Dubin  60 yo with history of ESRD, paroxysmal atrial fibrillation, and chronic diastolic CHF was referred to CHF clinic for evaluation of CHF and elevated PA pressure on echo.  Patient has had renal failure, probably due to HTN and diabetes.  He follows with Dr. Florene Glen.  He has been in atrial fibrillation since 6/13 when he failed a cardioversion and it was decided to leave him in atrial fibrillation.  He is now on coumadin.  Echo in 4/15 showed EF 0000000 with PA systolic pressure 85 mmHg.   RHC in 6/15 showing elevated right and left heart filling pressures with moderate pulmonary hypertension, primarily pulmonary venous hypertension.  He has been seen at [facility] for possible Renal transplant but told he is not a candidate due to high PA pressures.   He was admitted in 11/15 with acute on chronic diastolic CHF and AKI on CKD. He was started on oxygen.  He was started on hemodialysis for volume removal at that time.    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 Went to [facility], discharged home on 9/17.   He remains on home oxygen.  PFTs in 12/16 showed moderate-severe obstructive disease with concurrent restriction and decreased DLCO.  Echo in 12/6 showed EF 50-55%, PASP 38 mmHg with mildly decreased RV systolic function.  He can walk about 15-20 minutes now without dyspnea.  He does get short of breath if he walks without oxygen.  No chest pain.  He has been concerned because his HR increased to the 130s on Wednesday at HD and they would not let him leave.  Today, he is in mild sinus tachycardia with rate 107.  At last appointment,  amiodarone was decreased.  He wants to know if he can increase his amiodarone dose up again to keep HR down.  No lightheadedness, he does not feel palpitations.   Labs (8/14): K 4.4, creatinine 3.7 Labs (6/15): K 4.5, creatinine 4.88 Labs (10/30/13) : K 4.1 Creatinine 4.22 Labs (01/15/14): K 3.8 creatinine 5.57 Labs (12/15): HCT 30.9 Labs (12/16): TSH normal  ECG: sinus tachycardia at 107  PMH: 1. CKD: Followed by Dr. Florene Glen, likely due to HTN and diabetes. Progression to ESRD in 11/15.  2. Atrial fibrillation: Paroxysmal.  DCCV attempted in 6/13 but reverted to atrial fibrillation.  Now rhythm control with amiodarone. 3. H/o heavy ETOH, now does not drink. 4. Type II diabetes 5. HTN 6. Chronic diastolic CHF with elevated PA pressure: Echo (4/15) with EF 55-60%, mild MR, PA systolic pressure 85 mmHg, RV normal per report. RHC (6/15) with mean RA 12, PA 68/28 mean 44, mean PCWP 23 with prominent v-waves, CI 3.5, PVR 2.8 WU. Echo (12/16) with EF 50-55%, moderate LVH, normal RV size with mildly decreased systolic function.  PASP 38 mmHg.  7. Chronic hypoxic respiratory failure: He has been on home oxygen since hospitalization with PNA.  PFTs (12/16) with mod-severe obstructive disease suggestive of emphysema but also concurrent restriction and decreased DLCO (30%).     SH: On disability, prior smoker but quit (he was never a heavy smoker), prior heavy ETOH but quit, lives  in [place].  FH: Mother with valve replacement, sister with ESRD.   ROS: All systems reviewed and negative except as per HPI.   Current Outpatient Prescriptions  Medication Sig Dispense Refill  . amiodarone (PACERONE) 200 MG tablet Take 1 tablet (200 mg total) by mouth daily. (Patient taking differently: Take 200 mg by mouth daily with supper. ) 30 tablet 6  . atorvastatin (LIPITOR) 40 MG tablet Take 1 tablet (40 mg total) by mouth at bedtime. 30 tablet 1  . calcitRIOL (ROCALTROL) 0.25 MCG capsule Take 7 capsules (1.75  mcg total) by mouth every Monday, Wednesday, and Friday with hemodialysis.    Marland Kitchen colchicine 0.6 MG tablet Take 0.6 mg by mouth 2 (two) times daily.    . insulin glargine (LANTUS) 100 UNIT/ML injection Inject 0.17 mLs (17 Units total) into the skin at bedtime. (Patient not taking: Reported on 04/19/2015) 10 mL 11  . levETIRAcetam (KEPPRA) 500 MG tablet One tab twice a day, 1/2 tab extra after each dialysis (Patient taking differently: Take 500 mg by mouth See admin instructions. Take 1 tablet (500 mg) by mouth daily, take 1 tablet (500 mg) by mouth Sunday, Tuesday, Thursday, Saturday with supper, take 1 1/2 tablets (750 mg) by mouth on Monday, Wednesday, Friday with supper (dialysis days)) 90 tablet 6  . metoprolol (LOPRESSOR) 25 MG tablet Take 25 mg twice daily on Monday, Wednesday, and Friday (Dialysis days) and 50 mg twice daily on all other days. (Patient taking differently: Take 25 mg by mouth See admin instructions. Take 1 tablet (25 mg) twice daily on Monday, Wednesday, and Friday (Dialysis days) and take 2 tablets (50 mg) twice daily on Sunday, Tuesday, Thursday, Saturday) 100 tablet 6  . multivitamin (RENA-VIT) TABS tablet Take 1 tablet by mouth at bedtime. (Patient taking differently: Take 1 tablet by mouth daily with supper. ) 30 tablet 0  . warfarin (COUMADIN) 3 MG tablet Take as directed by coumadin clinic (Patient taking differently: Take 6 mg by mouth daily at 6 PM. or as directed by coumadin clinic) 65 tablet 3  . acetaminophen (TYLENOL) 500 MG tablet Take 1,000 mg by mouth every 6 (six) hours as needed (pain).    . insulin glargine (LANTUS) 100 unit/mL SOPN Inject 20 Units into the skin daily before supper.    Marland Kitchen ketorolac (ACULAR) 0.5 % ophthalmic solution Place 1 drop into the right eye See admin instructions. Instill 1 drop into right eye 4 times daily starting 72 hours prior to surgery  4  . loperamide (IMODIUM) 1 MG/5ML solution Take 6 mg by mouth 2 (two) times daily as needed for  diarrhea or loose stools.    Marland Kitchen ofloxacin (OCUFLOX) 0.3 % ophthalmic solution Place 1 drop into both eyes See admin instructions. Instill 1 drop into both eyes 4 times daily starting 72 hours prior to surgery  1  . prednisoLONE acetate (PRED FORTE) 1 % ophthalmic suspension Place 1 drop into the right eye See admin instructions. Instill 1 drop into the right eye starting after surgery  1  . sevelamer carbonate (RENVELA) 800 MG tablet Take 800-1,600 mg by mouth See admin instructions. Take 2 tablets (1600 mg) by mouth with meals and 1 tablet (800 mg) with snacks - total of 8 tablets daily     No current facility-administered medications for this encounter.    BP 130/70 mmHg  Pulse 115  Resp 20  Wt 189 lb 4 oz (85.843 kg)  SpO2 98% General: NAD Dyspnea at rest Neck: Supple  JVP flat.  no thyromegaly or thyroid nodule.  Lungs: CTAB Cardiac: PMI normal.  Mildly tachy, regular S1S2, no S3S4. 2/6 TR  no ankle edema.  No carotid bruit.  Normal pedal pulses.  Abdomen: Soft, nontender, no hepatosplenomegaly, nondistended.  Skin: Intact without lesions or rashes.  Neurologic: Alert and oriented x 3.  Psych: Normal affect. Extremities: No clubbing or cyanosis. L wrist fistula  Assessment/Plan: 1. Chronic diastolic CHF: Volume status well controlled with HD at this point. 2. ESRD: MWF HD.  3. Atrial fibrillation: Previously felt to be chronic. However, now in NSR on amiodarone. HR to the 130s recently noted at HD, not sure if this was atrial fibrillation, no ECG. He is in sinus tachycardia at 107 today.   - Continue warfarin. - Can continue amiodarone at 200 mg daily for now, but I am concerned about his PFTs and oxygen need => not sure of the etiology, need to involve pulmonary given risk for amiodarone lung toxicity. TSH 12/16 was normal, need to check LFTs.  - He takes metoprolol 50 mg bid except on HD days when he does not take metoprolol.  I will have him start metoprolol 25 mg bid on HD days.     4. Pulmonary HTN: Primarily pulmonary venous hypertension on last RHC.  This is likely improved now that he is getting HD.  Last echo estimated PASP 38 mmHg.  He was turned down for renal transplant due to pulmonary hypertension. - I will repeat RHC.  Hopefully, PA pressures will be lower and he can be re-considered for transplantation.  - Hold coumadin 3 days prior.  5. Chronic hypoxemic respiratory failure:  On home O2.  Etiology is uncertain.  Not volume related, fluid level looks good now on HD.  PFTs showed moderate-severe obstruction (but he was not a heavy smoker) as well as concurrent restriction and decreased DLCO.   - I will order noncontrast high resolution CT of lungs to look for interstitial lung disease.  - Given amiodarone use, I am concerned for possible toxicity.  I will arrange for pulmonary consultation.  [name] 04/21/2015

## 2015-04-25 NOTE — Discharge Instructions (Signed)
Angiogram, Care After °Refer to this sheet in the next few weeks. These instructions provide you with information about caring for yourself after your procedure. Your health care provider may also give you more specific instructions. Your treatment has been planned according to current medical practices, but problems sometimes occur. Call your health care provider if you have any problems or questions after your procedure. °WHAT TO EXPECT AFTER THE PROCEDURE °After your procedure, it is typical to have the following: °· Bruising at the catheter insertion site that usually fades within 1-2 weeks. °· Blood collecting in the tissue (hematoma) that may be painful to the touch. It should usually decrease in size and tenderness within 1-2 weeks. °HOME CARE INSTRUCTIONS °· Take medicines only as directed by your health care provider. °· You may shower 24-48 hours after the procedure or as directed by your health care provider. Remove the bandage (dressing) and gently wash the site with plain soap and water. Pat the area dry with a clean towel. Do not rub the site, because this may cause bleeding. °· Do not take baths, swim, or use a hot tub until your health care provider approves. °· Check your insertion site every day for redness, swelling, or drainage. °· Do not apply powder or lotion to the site. °· Do not lift over 10 lb (4.5 kg) for 5 days after your procedure or as directed by your health care provider. °· Ask your health care provider when it is okay to: °¨ Return to work or school. °¨ Resume usual physical activities or sports. °¨ Resume sexual activity. °· Do not drive home if you are discharged the same day as the procedure. Have someone else drive you. °· You may drive 24 hours after the procedure unless otherwise instructed by your health care provider. °· Do not operate machinery or power tools for 24 hours after the procedure or as directed by your health care provider. °· If your procedure was done as an  outpatient procedure, which means that you went home the same day as your procedure, a responsible adult should be with you for the first 24 hours after you arrive home. °· Keep all follow-up visits as directed by your health care provider. This is important. °SEEK MEDICAL CARE IF: °· You have a fever. °· You have chills. °· You have increased bleeding from the catheter insertion site. Hold pressure on the site. °SEEK IMMEDIATE MEDICAL CARE IF: °· You have unusual pain at the catheter insertion site. °· You have redness, warmth, or swelling at the catheter insertion site. °· You have drainage (other than a small amount of blood on the dressing) from the catheter insertion site. °· The catheter insertion site is bleeding, and the bleeding does not stop after 30 minutes of holding steady pressure on the site. °· The area near or just beyond the catheter insertion site becomes pale, cool, tingly, or numb. °  °This information is not intended to replace advice given to you by your health care provider. Make sure you discuss any questions you have with your health care provider. °  °Document Released: 10/23/2004 Document Revised: 04/27/2014 Document Reviewed: 09/07/2012 °Elsevier Interactive Patient Education ©2016 [employer] ° °

## 2015-04-25 NOTE — Interval H&P Note (Signed)
History and Physical Interval Note:  04/25/2015 8:14 AM  Joseph [name]  has presented today for surgery, with the diagnosis of chf  The various methods of treatment have been discussed with the patient and family. After consideration of risks, benefits and other options for treatment, the patient has consented to  Procedure(s): Right Heart Cath (N/A) as a surgical intervention .  The patient's history has been reviewed, patient examined, no change in status, stable for surgery.  I have reviewed the patient's chart and labs.  Questions were answered to the patient's satisfaction.     Floraine Buechler [employer]

## 2015-04-25 NOTE — Telephone Encounter (Signed)
Pt wife called she states her husband had cath w. Aundra Dubin this morning and he told her pt needs to see Pulm doctor sooner than 06/27/15. I did all I can as a scheduler [facility] has to call to speak to someone @ [facility] pulm. Please advise

## 2015-04-26 MED FILL — Lidocaine HCl Local Preservative Free (PF) Inj 1%: INTRAMUSCULAR | Qty: 30 | Status: AC

## 2015-04-30 ENCOUNTER — Ambulatory Visit (HOSPITAL_COMMUNITY)
Admission: RE | Admit: 2015-04-30 | Discharge: 2015-04-30 | Disposition: A | Discharge status: Home / Self Care | Payer: 59 | Source: Ambulatory Visit | Attending: Cardiology | Admitting: Cardiology

## 2015-04-30 ENCOUNTER — Encounter: Payer: Self-pay | Admitting: *Deleted

## 2015-04-30 ENCOUNTER — Ambulatory Visit (HOSPITAL_BASED_OUTPATIENT_CLINIC_OR_DEPARTMENT_OTHER)
Admission: RE | Admit: 2015-04-30 | Discharge: 2015-04-30 | Disposition: A | Discharge status: Home / Self Care | Payer: 59 | Source: Ambulatory Visit | Attending: Internal Medicine | Admitting: Internal Medicine

## 2015-04-30 ENCOUNTER — Telehealth: Payer: Self-pay | Admitting: Neurology

## 2015-04-30 DIAGNOSIS — I2510 Atherosclerotic heart disease of native coronary artery without angina pectoris: Secondary | ICD-10-CM | POA: Diagnosis not present

## 2015-04-30 DIAGNOSIS — I5032 Chronic diastolic (congestive) heart failure: Secondary | ICD-10-CM

## 2015-04-30 DIAGNOSIS — Z79899 Other long term (current) drug therapy: Secondary | ICD-10-CM | POA: Diagnosis present

## 2015-04-30 DIAGNOSIS — R918 Other nonspecific abnormal finding of lung field: Secondary | ICD-10-CM | POA: Diagnosis not present

## 2015-04-30 DIAGNOSIS — Z9189 Other specified personal risk factors, not elsewhere classified: Secondary | ICD-10-CM

## 2015-04-30 LAB — BASIC METABOLIC PANEL
Anion gap: 13 (ref 5–15)
BUN: 61 mg/dL — ABNORMAL HIGH (ref 6–20)
CO2: 30 mmol/L (ref 22–32)
Calcium: 9.5 mg/dL (ref 8.9–10.3)
Chloride: 98 mmol/L — ABNORMAL LOW (ref 101–111)
Creatinine, Ser: 9.35 mg/dL — ABNORMAL HIGH (ref 0.61–1.24)
GFR calc Af Amer: 6 mL/min — ABNORMAL LOW (ref >60)
GFR calc non Af Amer: 5 mL/min — ABNORMAL LOW (ref >60)
Glucose, Bld: 193 mg/dL — ABNORMAL HIGH (ref 65–99)
Potassium: 5 mmol/L (ref 3.5–5.1)
Sodium: 141 mmol/L (ref 135–145)

## 2015-04-30 NOTE — Telephone Encounter (Signed)
Pts wife called and states pt has been summons for jury duty Feb. 13th. She feels that he does not have the capacity to do this. She needs a letter indicating as such. Please call and advise [phone]

## 2015-04-30 NOTE — Telephone Encounter (Signed)
Letter has been written - reviewed and signed by Dr. Krista Blue.  Spoke to [name] (pt's wife on HIPPA) - she is aware that it will be up front for pick up at her convenience.

## 2015-05-01 ENCOUNTER — Telehealth: Payer: Self-pay

## 2015-05-01 NOTE — Telephone Encounter (Signed)
-----   Message from Juanito Doom, MD sent at 04/30/2015  2:41 PM EST ----- 4:30 this Thursday? ----- Message -----    From: Len Blalock, CMA    Sent: 04/29/2015   5:20 PM      To: Larey Dresser, MD, Juanito Doom, MD  Spoke with pt's wife-states patient has dialysis starting at 11:00 on Mon/Wed/Fri.  Pt can only do Tues/Thurs appts or early morning appts on M/W/F (can do 8:45-9:00).  BQ please advise on other appt times pt can be worked in.  Thanks!    ----- Message -----    From: Juanito Doom, MD    Sent: 04/25/2015   5:47 PM      To: Larey Dresser, MD, Len Blalock, CMA  [name],  Can you look at my schedule and see where you think we could fit him in sooner?  Maybe next Wednesday 4:30 after we see [facility]'s patient.  [name] ----- Message -----    From: Larey Dresser, MD    Sent: 04/25/2015   1:56 PM      To: Larey Dresser, MD, Juanito Doom, MD  [name], any chance you could see this guy sooner than March? He has been on amiodarone with difficult to control atrial fibrillation but has been on oxygen for several months and has ugly PFTs.  Want evaluation to for ?amiodarone lung toxicity.  I have ordered chest CT. Any chance you could see sooner?   Joseph [name]

## 2015-05-01 NOTE — Telephone Encounter (Signed)
lmtcb X1 for pt to see if he can come in tomorrow afternoon.  Wcb.

## 2015-05-01 NOTE — Telephone Encounter (Signed)
Patient's wife called back and spoke with Kelli-will be able to make appt tomorrow.  Appt will be scheduled as soon as the schedule is open.  Nothing further needed a this time.

## 2015-05-02 ENCOUNTER — Ambulatory Visit (INDEPENDENT_AMBULATORY_CARE_PROVIDER_SITE_OTHER): Payer: 59 | Admitting: Pulmonary Disease

## 2015-05-02 ENCOUNTER — Encounter: Payer: Self-pay | Admitting: Pulmonary Disease

## 2015-05-02 VITALS — BP 126/66 | HR 110 | Ht 72.0 in | Wt 183.0 lb

## 2015-05-02 DIAGNOSIS — J9611 Chronic respiratory failure with hypoxia: Secondary | ICD-10-CM | POA: Diagnosis not present

## 2015-05-02 DIAGNOSIS — I272 Other secondary pulmonary hypertension: Secondary | ICD-10-CM | POA: Diagnosis not present

## 2015-05-02 DIAGNOSIS — J849 Interstitial pulmonary disease, unspecified: Secondary | ICD-10-CM | POA: Insufficient documentation

## 2015-05-02 DIAGNOSIS — R0602 Shortness of breath: Secondary | ICD-10-CM | POA: Diagnosis not present

## 2015-05-02 DIAGNOSIS — G4733 Obstructive sleep apnea (adult) (pediatric): Secondary | ICD-10-CM

## 2015-05-02 NOTE — Assessment & Plan Note (Signed)
He walked about 500 feet in the office today and his oxygen dropped to 86% on RA. I have recommended that he continue oxygen at 2L with exertion and sleep for now.

## 2015-05-02 NOTE — Assessment & Plan Note (Addendum)
Joseph [name] has hypoxemia which has persisted since he had pneumonia in 2016.  However, he has done well from a symptomatic standpoint and walks every day.  Objectively he has ambulatory hypoxemia (mild) walk walking on room air in our office today, he has clear lungs, and his imaging on CT chest shows near complete resolution of his pneumonia.  In fact on his high resolution CT chest from this week his lungs are almost completely normal.  However he has significant air trapping on his PFTs and has a markedly depressed DLCO.  While I believe his diffusion deficit has something to do with his pulmonary hypertension, the airtrapping makes me question whether or not he has some degree of bronchiolitis which has been left over from his episode of pneumonia last year.  Less likely considerations would be an autoimmune process, but considering the marked improvement in his CT chest I think that post infectious is the most likely cause.  I doubt the amiodarone has anything to do with this as his imaging has improved since starting amiodarone, but the negative effects of amiodarone are limitless so it needs to remain in the differential diagnosis.  Plan summary: Treat as post infectious bronchiolitis with symbicort 2 puffs twice a day Repeat PFT in 3 months, if worse consider open lung biopsy Continue amiodarone for now Consider the mild findings on his PFTs, hypoxemia, and pulmonary hypertension, I don't believe his respiratory status would present a barrier to having a renal transplant surgery F/u 3 months with PFT

## 2015-05-02 NOTE — Progress Notes (Signed)
Subjective:    Patient ID: Joseph [name], male    DOB: 07-Jan-1956, 60 y.o.   MRN: [id number]  HPI Chief Complaint  Patient presents with  . Advice Only    Referred by Dr. Aundra Dubin for abn pft.  pt had HRCT recently.  pt has no breathing complaints today.      This is a pleasant 60 y/o male referred for evaluation of ongoing hypoxemic respiratory failure.  He had a normal childhood without respiratory illnesses.  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  He has developed ESRD in the setting of DM2 and Hypertension and has been on dialysis coordinated by Dr. Florene Glen for several years.  In November 2015 he was admitted to the hospital for pneumonia, sent out on oxygen and then had it stopped as he improved.  In July 2016 he was admitted for hyperglycemia and developed HCAP and was discharged on oxygen.  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  He has been using oxygen 24 hours a day since then and is feeling well.  He walks 1/2 mile 4 times per week and does not have dyspnea with that.  The only time he can remember getting short of breath was about a month ago when he didn't have his oxygen for about 2-3 hours when traveling home from dialysis.  In general he is feeling better since coming home from the hospital.  He has a cough from time to time that he attributes to post nasal drip.  He still takes amiodarone.  He has known pulmonary hypertension with a normal wedge which was actually better on his 04/2015 right heart catheterization. He and his wife would like for him to have a renal transplant.    Past Medical History  Diagnosis Date  . Atrial fibrillation ([place])     a. 11/18/2011 s/p DCCV - 150J;  b. Anticoagulation w/ Apixaban;  c. 11/2011 back in afib->asymptomatic.  . H/O alcohol abuse     a. drinks heavily on the weekends.  . Hypertension   .  Diabetes mellitus ([place])   . Heart murmur     a. 09/2011 Echo: EF 55-60%, [place] AI, Mild MR, mildly dil LA.  . Diabetic peripheral neuropathy ([place])   . CKD (chronic kidney disease), stage III   . CHF (congestive heart failure) ([place])   . Pneumonia   . Sleep apnea   . Seizure ([place])   . Chronic kidney disease requiring chronic dialysis ([place])     3 times weekly     Family History  Problem Relation Age of Onset  . Heart disease Mother     before age 77  . Diabetes Father   . Diabetes Sister   . Peripheral vascular disease Sister     amputation  . Cancer Neg Hx   . Stroke Neg Hx      Social History   Social History  . Marital Status: Married    Spouse Name: N/A  . Number of Children: 0  . Years of Education: 14   Occupational History  . Disabled    Social History Main Topics  . Smoking status: Former Smoker -- 0.01 packs/day for 5 years    Types: Cigarettes    Quit date: 04/20/1994  . Smokeless tobacco: Never Used  . Alcohol Use: No  Comment: Former use  . Drug Use: No  . Sexual Activity: Yes   Other Topics Concern  . Not on file   Social History Narrative   Lives at home with his wife.   Right-handed.   No caffeine use.     Allergies  Allergen Reactions  . Keflex [Cephalexin] Other (See Comments)    c-diff reaction  . Dilaudid [Hydromorphone] Nausea And Vomiting     Outpatient Prescriptions Prior to Visit  Medication Sig Dispense Refill  . acetaminophen (TYLENOL) 500 MG tablet Take 1,000 mg by mouth every 6 (six) hours as needed (pain).    Marland Kitchen amiodarone (PACERONE) 200 MG tablet Take 1 tablet (200 mg total) by mouth daily. (Patient taking differently: Take 200 mg by mouth daily with supper. ) 30 tablet 6  . atorvastatin (LIPITOR) 40 MG tablet Take 1 tablet (40 mg total) by mouth at bedtime. 30 tablet 1  . calcitRIOL (ROCALTROL) 0.25 MCG capsule Take 7 capsules (1.75 mcg total) by mouth every Monday, Wednesday, and Friday with hemodialysis.    Marland Kitchen colchicine  0.6 MG tablet Take 0.6 mg by mouth 2 (two) times daily.    . insulin glargine (LANTUS) 100 UNIT/ML injection Inject 0.17 mLs (17 Units total) into the skin at bedtime. 10 mL 11  . insulin glargine (LANTUS) 100 unit/mL SOPN Inject 17-20 Units into the skin daily before supper.     Marland Kitchen ketorolac (ACULAR) 0.5 % ophthalmic solution Place 1 drop into the right eye See admin instructions. Instill 1 drop into right eye 4 times daily starting 72 hours prior to surgery  4  . levETIRAcetam (KEPPRA) 500 MG tablet One tab twice a day, 1/2 tab extra after each dialysis (Patient taking differently: Take 500 mg by mouth See admin instructions. Take 1 tablet (500 mg) by mouth daily, take 1 tablet (500 mg) by mouth Sunday, Tuesday, Thursday, Saturday with supper, take 1 1/2 tablets (750 mg) by mouth on Monday, Wednesday, Friday with supper (dialysis days)) 90 tablet 6  . loperamide (IMODIUM) 1 MG/5ML solution Take 6 mg by mouth 2 (two) times daily as needed for diarrhea or loose stools.    . metoprolol (LOPRESSOR) 25 MG tablet Take 25 mg twice daily on Monday, Wednesday, and Friday (Dialysis days) and 50 mg twice daily on all other days. (Patient taking differently: Take 25 mg by mouth See admin instructions. Take 1 tablet (25 mg) twice daily on Monday, Wednesday, and Friday (Dialysis days) and take 2 tablets (50 mg) twice daily on Sunday, Tuesday, Thursday, Saturday) 100 tablet 6  . multivitamin (RENA-VIT) TABS tablet Take 1 tablet by mouth at bedtime. (Patient taking differently: Take 1 tablet by mouth daily with supper. ) 30 tablet 0  . ofloxacin (OCUFLOX) 0.3 % ophthalmic solution Place 1 drop into both eyes See admin instructions. Instill 1 drop into both eyes 4 times daily starting 72 hours prior to surgery  1  . prednisoLONE acetate (PRED FORTE) 1 % ophthalmic suspension Place 1 drop into the right eye See admin instructions. Instill 1 drop into the right eye starting after surgery  1  . sevelamer carbonate (RENVELA)  800 MG tablet Take 800-1,600 mg by mouth See admin instructions. Take 2 tablets (1600 mg) by mouth with meals and 1 tablet (800 mg) with snacks - total of 8 tablets daily    . warfarin (COUMADIN) 3 MG tablet Take as directed by coumadin clinic (Patient taking differently: Take 6 mg by mouth daily at 6 PM. or  as directed by coumadin clinic) 65 tablet 3   No facility-administered medications prior to visit.       Review of Systems  Constitutional: Negative for fever and unexpected weight change.  HENT: Positive for congestion, postnasal drip and sinus pressure. Negative for dental problem, ear pain, nosebleeds, rhinorrhea, sneezing, sore throat and trouble swallowing.   Eyes: Negative for redness and itching.  Respiratory: Positive for shortness of breath. Negative for cough, chest tightness and wheezing.   Cardiovascular: Negative for palpitations and leg swelling.  Gastrointestinal: Negative for nausea and vomiting.  Genitourinary: Negative for dysuria.  Musculoskeletal: Negative for joint swelling.  Skin: Negative for rash.  Neurological: Negative for headaches.  Hematological: Does not bruise/bleed easily.  Psychiatric/Behavioral: Negative for dysphoric mood. The patient is not nervous/anxious.        Objective:   Physical Exam Filed Vitals:   05/02/15 1642  BP: 126/66  Pulse: 110  Height: 6' (1.829 m)  Weight: 183 lb (83.008 kg)  SpO2: 98%   2L [place]  Gen: well appearing, no acute distress HENT: NCAT, OP clear, neck supple without masses Eyes: PERRL, EOMi Lymph: no cervical lymphadenopathy PULM: CTA B CV: RRR, slight systolic murmur, no JVD GI: BS+, soft, nontender, no hsm Derm: no rash or skin breakdown MSK: normal bulk and tone Neuro: A&Ox4, CN II-XII intact, strength 5/5 in all 4 extremities Psyche: normal mood and affect    11/2014 CT chest > diffuse GGO in upper lobes bilaterally, dense left lower lobe consolidation 03/2015 PFT> ratio 77%, FEV1 1.91 L (10%  pred), FVC 2.48 L (61% pred), TLC 6.22 L (88% pred), DLCO 10.56 (32% pred) 04/2015 HRCT scan images personally reviewed, read by Dr.  There is very mild groundglass in patchy distributions in the upper lobes which has improved compare prior CT, There is no underlying emphysema but is one or 2 cysts seen. January 2017 rightt catheterization: Right Atrial pressure 9, right ventricle 47/10 pulmonary artery 42/22 (mean 30), PCWP 14    Assessment & Plan:  Interstitial lung disease [facility]) Mr. Hey has hypoxemia which has persisted since he had pneumonia in 2016.  However, he has done well from a symptomatic standpoint and walks every day.  Objectively he has ambulatory hypoxemia (mild) walk walking on room air in our office today, he has clear lungs, and his imaging on CT chest shows near complete resolution of his pneumonia.  In fact on his high resolution CT chest from this week his lungs are almost completely normal.  However he has significant air trapping on his PFTs and has a markedly depressed DLCO.  While I believe his diffusion deficit has something to do with his pulmonary hypertension, the airtrapping makes me question whether or not he has some degree of bronchiolitis which has been left over from his episode of pneumonia last year.  Less likely considerations would be an autoimmune process, but considering the marked improvement in his CT chest I think that post infectious is the most likely cause.  I doubt the amiodarone has anything to do with this as his imaging has improved since starting amiodarone, but the negative effects of amiodarone are limitless so it needs to remain in the differential diagnosis.  Plan summary: Treat as post infectious bronchiolitis with symbicort 2 puffs twice a day Repeat PFT in 3 months, if worse consider open lung biopsy Continue amiodarone for now Consider the mild findings on his PFTs, hypoxemia, and pulmonary hypertension, I don't believe his respiratory status  would  present a barrier to having a renal transplant surgery F/u 3 months with PFT  Chronic respiratory failure with hypoxia ([place]) He walked about 500 feet in the office today and his oxygen dropped to 86% on RA. I have recommended that he continue oxygen at 2L with exertion and sleep for now.  Pulmonary hypertension ([place]) He has mild pulmonary hypertension with a normal wedge.  This may be related to post infectious bronchiolitis.  I don't see where he has ever had a V/Q scan, though he has no history of clots.  He has obstructive sleep apnea (presumed) which is untreated, so this needs to be assessed.  Plan: Treatment per Dr. Aundra Dubin Will order polysomnogram Would consider V/Q scan  Obstructive sleep apnea He has some fatigue during the daytime and snores often.  His wife has witnessed nocturnal apneas.  Plan: Re-order polysomnogram    Current outpatient prescriptions:  .  acetaminophen (TYLENOL) 500 MG tablet, Take 1,000 mg by mouth every 6 (six) hours as needed (pain)., Disp: , Rfl:  .  amiodarone (PACERONE) 200 MG tablet, Take 1 tablet (200 mg total) by mouth daily. (Patient taking differently: Take 200 mg by mouth daily with supper. ), Disp: 30 tablet, Rfl: 6 .  atorvastatin (LIPITOR) 40 MG tablet, Take 1 tablet (40 mg total) by mouth at bedtime., Disp: 30 tablet, Rfl: 1 .  calcitRIOL (ROCALTROL) 0.25 MCG capsule, Take 7 capsules (1.75 mcg total) by mouth every Monday, Wednesday, and Friday with hemodialysis., Disp: , Rfl:  .  colchicine 0.6 MG tablet, Take 0.6 mg by mouth 2 (two) times daily., Disp: , Rfl:  .  insulin glargine (LANTUS) 100 UNIT/ML injection, Inject 0.17 mLs (17 Units total) into the skin at bedtime., Disp: 10 mL, Rfl: 11 .  insulin glargine (LANTUS) 100 unit/mL SOPN, Inject 17-20 Units into the skin daily before supper. , Disp: , Rfl:  .  ketorolac (ACULAR) 0.5 % ophthalmic solution, Place 1 drop into the right eye See admin instructions. Instill 1 drop into  right eye 4 times daily starting 72 hours prior to surgery, Disp: , Rfl: 4 .  levETIRAcetam (KEPPRA) 500 MG tablet, One tab twice a day, 1/2 tab extra after each dialysis (Patient taking differently: Take 500 mg by mouth See admin instructions. Take 1 tablet (500 mg) by mouth daily, take 1 tablet (500 mg) by mouth Sunday, Tuesday, Thursday, Saturday with supper, take 1 1/2 tablets (750 mg) by mouth on Monday, Wednesday, Friday with supper (dialysis days)), Disp: 90 tablet, Rfl: 6 .  loperamide (IMODIUM) 1 MG/5ML solution, Take 6 mg by mouth 2 (two) times daily as needed for diarrhea or loose stools., Disp: , Rfl:  .  metoprolol (LOPRESSOR) 25 MG tablet, Take 25 mg twice daily on Monday, Wednesday, and Friday (Dialysis days) and 50 mg twice daily on all other days. (Patient taking differently: Take 25 mg by mouth See admin instructions. Take 1 tablet (25 mg) twice daily on Monday, Wednesday, and Friday (Dialysis days) and take 2 tablets (50 mg) twice daily on Sunday, Tuesday, Thursday, Saturday), Disp: 100 tablet, Rfl: 6 .  multivitamin (RENA-VIT) TABS tablet, Take 1 tablet by mouth at bedtime. (Patient taking differently: Take 1 tablet by mouth daily with supper. ), Disp: 30 tablet, Rfl: 0 .  ofloxacin (OCUFLOX) 0.3 % ophthalmic solution, Place 1 drop into both eyes See admin instructions. Instill 1 drop into both eyes 4 times daily starting 72 hours prior to surgery, Disp: , Rfl: 1 .  prednisoLONE  acetate (PRED FORTE) 1 % ophthalmic suspension, Place 1 drop into the right eye See admin instructions. Instill 1 drop into the right eye starting after surgery, Disp: , Rfl: 1 .  sevelamer carbonate (RENVELA) 800 MG tablet, Take 800-1,600 mg by mouth See admin instructions. Take 2 tablets (1600 mg) by mouth with meals and 1 tablet (800 mg) with snacks - total of 8 tablets daily, Disp: , Rfl:  .  warfarin (COUMADIN) 3 MG tablet, Take as directed by coumadin clinic (Patient taking differently: Take 6 mg by mouth  daily at 6 PM. or as directed by coumadin clinic), Disp: 65 tablet, Rfl: 3

## 2015-05-02 NOTE — Patient Instructions (Addendum)
Try taking Symbicort 2 puffs twice a day no matter how you feel.  Keep track of your oxygen level after you start using the Symbicort. We will arrange a sleep study I want you to keep using the O2 as you are doing We will arrange a pulmonary function test in 3 months We will see you back in 3 months or sooner if needed

## 2015-05-02 NOTE — Assessment & Plan Note (Signed)
He has some fatigue during the daytime and snores often.  His wife has witnessed nocturnal apneas.  Plan: Re-order polysomnogram

## 2015-05-02 NOTE — Progress Notes (Signed)
Agree with Dr Lake Bells that he needs V/Q scan.  Please order.

## 2015-05-02 NOTE — Assessment & Plan Note (Signed)
He has mild pulmonary hypertension with a normal wedge.  This may be related to post infectious bronchiolitis.  I don't see where he has ever had a V/Q scan, though he has no history of clots.  He has obstructive sleep apnea (presumed) which is untreated, so this needs to be assessed.  Plan: Treatment per Dr. Aundra Dubin Will order polysomnogram Would consider V/Q scan

## 2015-05-07 ENCOUNTER — Ambulatory Visit (INDEPENDENT_AMBULATORY_CARE_PROVIDER_SITE_OTHER): Payer: 59

## 2015-05-07 DIAGNOSIS — I482 Chronic atrial fibrillation, unspecified: Secondary | ICD-10-CM

## 2015-05-07 DIAGNOSIS — I4821 Permanent atrial fibrillation: Secondary | ICD-10-CM

## 2015-05-07 LAB — POCT INR: INR: 2.6

## 2015-05-09 ENCOUNTER — Other Ambulatory Visit (HOSPITAL_COMMUNITY): Payer: Self-pay | Admitting: *Deleted

## 2015-05-09 ENCOUNTER — Ambulatory Visit (HOSPITAL_COMMUNITY)
Admission: RE | Admit: 2015-05-09 | Discharge: 2015-05-09 | Disposition: A | Discharge status: Home / Self Care | Payer: 59 | Source: Ambulatory Visit | Attending: Internal Medicine | Admitting: Internal Medicine

## 2015-05-09 ENCOUNTER — Telehealth: Payer: Self-pay | Admitting: Pulmonary Disease

## 2015-05-09 ENCOUNTER — Encounter (HOSPITAL_COMMUNITY): Payer: Self-pay

## 2015-05-09 ENCOUNTER — Encounter (HOSPITAL_COMMUNITY): Payer: Self-pay | Admitting: *Deleted

## 2015-05-09 VITALS — BP 106/60 | HR 88 | Wt 186.2 lb

## 2015-05-09 DIAGNOSIS — I272 Other secondary pulmonary hypertension: Secondary | ICD-10-CM

## 2015-05-09 DIAGNOSIS — R0600 Dyspnea, unspecified: Secondary | ICD-10-CM

## 2015-05-09 DIAGNOSIS — Z7901 Long term (current) use of anticoagulants: Secondary | ICD-10-CM | POA: Diagnosis not present

## 2015-05-09 DIAGNOSIS — I5032 Chronic diastolic (congestive) heart failure: Secondary | ICD-10-CM | POA: Diagnosis present

## 2015-05-09 DIAGNOSIS — J9611 Chronic respiratory failure with hypoxia: Secondary | ICD-10-CM | POA: Diagnosis not present

## 2015-05-09 DIAGNOSIS — I4891 Unspecified atrial fibrillation: Secondary | ICD-10-CM | POA: Diagnosis not present

## 2015-05-09 DIAGNOSIS — N186 End stage renal disease: Secondary | ICD-10-CM | POA: Insufficient documentation

## 2015-05-09 DIAGNOSIS — I480 Paroxysmal atrial fibrillation: Secondary | ICD-10-CM | POA: Diagnosis not present

## 2015-05-09 LAB — HEPATIC FUNCTION PANEL
ALBUMIN: 3.7 g/dL (ref 3.5–5.0)
ALK PHOS: 111 U/L (ref 38–126)
ALT: 25 U/L (ref 17–63)
AST: 32 U/L (ref 15–41)
BILIRUBIN TOTAL: 0.4 mg/dL (ref 0.3–1.2)
Bilirubin, Direct: 0.1 mg/dL (ref 0.1–0.5)
Indirect Bilirubin: 0.3 mg/dL (ref 0.3–0.9)
Total Protein: 6.9 g/dL (ref 6.5–8.1)

## 2015-05-09 LAB — PROTIME-INR
INR: 2.99 — AB (ref 0.00–1.49)
PROTHROMBIN TIME: 30.5 s — AB (ref 11.6–15.2)

## 2015-05-09 MED ORDER — BUDESONIDE-FORMOTEROL FUMARATE 80-4.5 MCG/ACT IN AERO: 2.0000 | INHALATION_SPRAY | Freq: Two times a day (BID) | RESPIRATORY_TRACT | Status: AC

## 2015-05-09 NOTE — Progress Notes (Signed)
Patient ID: Joseph [name], male   DOB: November 17, 1955, 60 y.o.   MRN: [id number]  ADVANCED HF CLINIC NOTE  Patient ID: Joseph [name], male   DOB: 18-Oct-1955, 60 y.o.   MRN: [id number] Referring MD: Dr. Caryl Comes Nephrology: Dr Joelyn Oms CHF: Aundra Dubin  60 yo with history of ESRD, paroxysmal atrial fibrillation, and chronic diastolic CHF was referred to CHF clinic for evaluation of CHF and elevated PA pressure on echo.  Patient has had renal failure, probably due to HTN and diabetes.  He follows with Dr. Florene Glen.  He has been in atrial fibrillation since 6/13 when he failed a cardioversion and it was decided to leave him in atrial fibrillation.  He is now on coumadin.  Echo in 4/15 showed EF 0000000 with PA systolic pressure 85 mmHg.   RHC in 6/15 showing elevated right and left heart filling pressures with moderate pulmonary hypertension, primarily pulmonary venous hypertension.  He has been seen at [facility] for possible Renal transplant but told he is not a candidate due to high PA pressures.   He was admitted in 11/15 with acute on chronic diastolic CHF and AKI on CKD. He was started on oxygen.  He was started on hemodialysis for volume removal at that time.    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 Went to [facility], discharged home on 9/17.   He remains on home oxygen.  PFTs in 12/16 showed moderate-severe obstructive disease with concurrent restriction and decreased DLCO.  Echo in 12/6 showed EF 50-55%, PASP 38 mmHg with mildly decreased RV systolic function.  RHC was done, showing mild pulmonary hypertension.  High resolution CT was not significantly abnormal.  He saw Dr Lake Bells with pulmonary and was thought to possibly have a post-infectious bronchiolitis causing his oxygen requirement.  He is now on Symbicort and feels like his breathing is a bit better.  He is  walking without oxygen and denies exertional dyspnea. No chest pain.  No lightheadedness.  He does not feel palpitations.  He is in atrial fibrillation today.   ECG: Atrial fibrillation, nonspecific T wave changes.  Labs (8/14): K 4.4, creatinine 3.7 Labs (6/15): K 4.5, creatinine 4.88 Labs (10/30/13) : K 4.1 Creatinine 4.22 Labs (01/15/14): K 3.8 creatinine 5.57 Labs (12/15): HCT 30.9 Labs (12/16): TSH normal  PMH: 1. CKD: Followed by Dr. Florene Glen, likely due to HTN and diabetes. Progression to ESRD in 11/15.  2. Atrial fibrillation: Paroxysmal.  DCCV attempted in 6/13 but reverted to atrial fibrillation.  Now rhythm control with amiodarone. 3. H/o heavy ETOH, now does not drink. 4. Type II diabetes 5. HTN 6. Chronic diastolic CHF with elevated PA pressure: Echo (4/15) with EF 55-60%, mild MR, PA systolic pressure 85 mmHg, RV normal per report. RHC (6/15) with mean RA 12, PA 68/28 mean 44, mean PCWP 23 with prominent v-waves, CI 3.5, PVR 2.8 WU. Echo (12/16) with EF 50-55%, moderate LVH, normal RV size with mildly decreased systolic function.  PASP 38 mmHg.  RHC (1/17) with mean RA 9, PA 42/22 mean 30, mean PCWP 14, CI 3, PVR 2.6 WU.  7. Chronic hypoxic respiratory failure: He has been on home oxygen since hospitalization with PNA.   - PFTs (12/16) with mod-severe obstructive disease suggestive of emphysema but also concurrent restriction and decreased DLCO (30%).    - High resolution CT (1/17): Not significantly abnormal.  - Suspect  post-infectious bronchiolitis.  8. Pulmonary hypertension: RHC (1/17) with mean RA 9, PA 42/22 mean 30, mean PCWP 14, CI 3, PVR 2.6 WU.  Mild, probably related to underlying lung disease (Group 3).    SH: On disability, prior smoker but quit (he was never a heavy smoker), prior heavy ETOH but quit, lives in [place].  FH: Mother with valve replacement, sister with ESRD.   ROS: All systems reviewed and negative except as per HPI.   Current Outpatient  Prescriptions  Medication Sig Dispense Refill  . acetaminophen (TYLENOL) 500 MG tablet Take 1,000 mg by mouth every 6 (six) hours as needed (pain). Reported on 05/09/2015    . amiodarone (PACERONE) 200 MG tablet Take 1 tablet (200 mg total) by mouth daily. (Patient taking differently: Take 200 mg by mouth 2 (two) times daily. ) 30 tablet 6  . atorvastatin (LIPITOR) 40 MG tablet Take 1 tablet (40 mg total) by mouth at bedtime. 30 tablet 1  . calcitRIOL (ROCALTROL) 0.25 MCG capsule Take 7 capsules (1.75 mcg total) by mouth every Monday, Wednesday, and Friday with hemodialysis.    Marland Kitchen colchicine 0.6 MG tablet Take 0.6 mg by mouth 2 (two) times daily.    . insulin glargine (LANTUS) 100 UNIT/ML injection Inject 0.17 mLs (17 Units total) into the skin at bedtime. (Patient not taking: Reported on 05/09/2015) 10 mL 11  . insulin glargine (LANTUS) 100 unit/mL SOPN Inject 17-20 Units into the skin daily before supper.     Marland Kitchen ketorolac (ACULAR) 0.5 % ophthalmic solution Place 1 drop into the right eye See admin instructions. Instill 1 drop into right eye 4 times daily starting 72 hours prior to surgery  4  . levETIRAcetam (KEPPRA) 500 MG tablet Take 500 mg by mouth as directed. 500 mg twice a day. Mon, Wed, Fri on dialysis days an additional 250 mg with supper.    . loperamide (IMODIUM) 1 MG/5ML solution Take 6 mg by mouth 2 (two) times daily as needed for diarrhea or loose stools.    . metoprolol tartrate (LOPRESSOR) 25 MG tablet Take 25 mg by mouth as directed. Take 1 tablet (25 mg) twice daily on Monday, Wednesday, and Friday (Dialysis days) and take 2 tablets (50 mg) twice daily on Sunday, Tuesday, Thursday, Saturday    . sevelamer carbonate (RENVELA) 800 MG tablet Take 800-2,400 mg by mouth See admin instructions. Take 3 tablets (2400 mg) by mouth with meals and 1 tablet (800 mg) with snacks - total of 8 tablets daily    . warfarin (COUMADIN) 3 MG tablet Take as directed by coumadin clinic (Patient taking  differently: Take 6 mg by mouth daily at 6 PM. or as directed by coumadin clinic) 65 tablet 3  . budesonide-formoterol (SYMBICORT) 80-4.5 MCG/ACT inhaler Inhale 2 puffs into the lungs 2 (two) times daily. 1 Inhaler 5  . multivitamin (RENA-VIT) TABS tablet Take 1 tablet by mouth at bedtime. (Patient taking differently: Take 1 tablet by mouth daily with supper. ) 30 tablet 0  . ofloxacin (OCUFLOX) 0.3 % ophthalmic solution Place 1 drop into both eyes See admin instructions. Reported on 05/09/2015  1  . prednisoLONE acetate (PRED FORTE) 1 % ophthalmic suspension Place 1 drop into the right eye See admin instructions. Reported on 05/09/2015  1   No current facility-administered medications for this encounter.    BP 106/60 mmHg  Pulse 88  Wt 186 lb 4 oz (84.482 kg)  SpO2 98% General: NAD Dyspnea at rest Neck: Supple JVP flat.  no thyromegaly or thyroid nodule.  Lungs: CTAB Cardiac: PMI normal.   Irregular S1S2, no S3S4. 2/6 TR  no ankle edema.  No carotid bruit.  Normal pedal pulses.  Abdomen: Soft, nontender, no hepatosplenomegaly, nondistended.  Skin: Intact without lesions or rashes.  Neurologic: Alert and oriented x 3.  Psych: Normal affect. Extremities: No clubbing or cyanosis. L wrist fistula  Assessment/Plan: 1. Chronic diastolic CHF: Volume status well controlled with HD at this point. 2. ESRD: MWF HD.  3. Atrial fibrillation: Up until last appointment, he had been in NSR on amiodarone.  Now back in atrial fibrillation.  He was in atrial fibrillation when I did his RHC earlier this month.    - Continue warfarin. - I will plan DCCV => INR has been therapeutic for > 1 month.  Check INR today and next week on the day of the cardioversion.  We discussed risks/benefits of the procedure and he agrees to proceed. - Increase amiodarone to 200 mg bid until cardioversion.  He had a recent pulmonary evaluation, and given significant clearance of chest CT since his PNA in the fall, Dr Lake Bells  thought amiodarone lung toxicity was unlikely. Check LFTs today, recent TSH normal.  He will need regular eye exams while on amiodarone.  - Continue current metoprolol.   4. Pulmonary HTN: Primarily pulmonary venous hypertension on 6/15 RHC.  With dialysis, this improved.  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 He was turned down for renal transplant due to pulmonary hypertension.  I do not think this would be a limiting factor at this point.  - He needs a V/Q scan to rule out chronic PE. - Sleep study planned . - He is going to talk to Dr Joelyn Oms about getting evaluated again for renal transplant.  I would be glad to send records.  5. Chronic hypoxemic respiratory failure:  On home O2.  Etiology is uncertain.  He is not volume overloaded at this point.  He saw pulmonary (Dr Lake Bells) and was thought to have post-infectious bronchiolitis as the primary cause of hypoxemia. Amiodarone toxicity thought unlikely.  [name] 05/09/2015

## 2015-05-09 NOTE — Telephone Encounter (Signed)
Spoke with pt's wife, states symbicort sample worked well for pt and would like rx sent to pharmacy.  This has been sent.  Nothing further needed.

## 2015-05-09 NOTE — Patient Instructions (Signed)
Increase Amiodarone to 200 mg Twice daily UNTIL after cardioversion THEN decrease back to 200 mg daily  Labs today  INR check on Thur 1/26 AM  VQ Scan  Your physician has recommended that you have a Cardioversion (DCCV). Electrical Cardioversion uses a jolt of electricity to your heart either through paddles or wired patches attached to your chest. This is a controlled, usually prescheduled, procedure. Defibrillation is done under light anesthesia in the hospital, and you usually go home the day of the procedure. This is done to get your heart back into a normal rhythm. You are not awake for the procedure. Please see the instruction sheet given to you today.  Your physician recommends that you schedule a follow-up appointment in: 1 month

## 2015-05-10 ENCOUNTER — Telehealth (HOSPITAL_COMMUNITY): Payer: Self-pay | Admitting: *Deleted

## 2015-05-10 NOTE — Telephone Encounter (Signed)
No pre cert reqd for cardioversion

## 2015-05-16 ENCOUNTER — Encounter (HOSPITAL_COMMUNITY): Payer: Self-pay | Admitting: Anesthesiology

## 2015-05-16 ENCOUNTER — Ambulatory Visit (HOSPITAL_COMMUNITY)
Admission: RE | Admit: 2015-05-16 | Discharge: 2015-05-16 | Disposition: A | Discharge status: Home / Self Care | Payer: 59 | Source: Ambulatory Visit | Attending: Cardiology | Admitting: Cardiology

## 2015-05-16 ENCOUNTER — Encounter (HOSPITAL_COMMUNITY): Payer: Self-pay | Admitting: *Deleted

## 2015-05-16 ENCOUNTER — Encounter (HOSPITAL_COMMUNITY)
Admission: RE | Disposition: A | Discharge status: Home / Self Care | Payer: 59 | Source: Ambulatory Visit | Attending: Cardiology

## 2015-05-16 ENCOUNTER — Ambulatory Visit (INDEPENDENT_AMBULATORY_CARE_PROVIDER_SITE_OTHER): Payer: 59 | Admitting: *Deleted

## 2015-05-16 DIAGNOSIS — Z5309 Procedure and treatment not carried out because of other contraindication: Secondary | ICD-10-CM | POA: Diagnosis not present

## 2015-05-16 DIAGNOSIS — I482 Chronic atrial fibrillation, unspecified: Secondary | ICD-10-CM

## 2015-05-16 DIAGNOSIS — I4821 Permanent atrial fibrillation: Secondary | ICD-10-CM

## 2015-05-16 DIAGNOSIS — I4891 Unspecified atrial fibrillation: Secondary | ICD-10-CM | POA: Insufficient documentation

## 2015-05-16 LAB — POCT INR: INR: 3.3

## 2015-05-16 SURGERY — CANCELLED PROCEDURE

## 2015-05-23 ENCOUNTER — Ambulatory Visit (INDEPENDENT_AMBULATORY_CARE_PROVIDER_SITE_OTHER): Payer: 59 | Admitting: *Deleted

## 2015-05-23 ENCOUNTER — Ambulatory Visit: Payer: Medicare Other | Admitting: Podiatry

## 2015-05-23 DIAGNOSIS — I482 Chronic atrial fibrillation, unspecified: Secondary | ICD-10-CM

## 2015-05-23 DIAGNOSIS — I4821 Permanent atrial fibrillation: Secondary | ICD-10-CM

## 2015-05-23 LAB — POCT INR: INR: 2.9

## 2015-05-28 ENCOUNTER — Ambulatory Visit (HOSPITAL_COMMUNITY)
Admission: RE | Admit: 2015-05-28 | Discharge: 2015-05-28 | Disposition: A | Discharge status: Home / Self Care | Payer: 59 | Source: Ambulatory Visit | Attending: Cardiology | Admitting: Cardiology

## 2015-05-28 DIAGNOSIS — I517 Cardiomegaly: Secondary | ICD-10-CM | POA: Insufficient documentation

## 2015-05-28 DIAGNOSIS — R0600 Dyspnea, unspecified: Secondary | ICD-10-CM | POA: Diagnosis not present

## 2015-05-28 DIAGNOSIS — Z992 Dependence on renal dialysis: Secondary | ICD-10-CM | POA: Diagnosis not present

## 2015-05-28 MED ORDER — TECHNETIUM TO 99M ALBUMIN AGGREGATED
3.0000 | Freq: Once | INTRAVENOUS | Status: AC | PRN
  Administered 2015-05-28: 3 via INTRAVENOUS

## 2015-05-28 MED ORDER — TECHNETIUM TC 99M DIETHYLENETRIAME-PENTAACETIC ACID: 30.0000 | Freq: Once | INTRAVENOUS | Status: DC | PRN

## 2015-06-11 ENCOUNTER — Ambulatory Visit (INDEPENDENT_AMBULATORY_CARE_PROVIDER_SITE_OTHER): Payer: Medicare Other | Admitting: Interventional Cardiology

## 2015-06-11 ENCOUNTER — Encounter (HOSPITAL_COMMUNITY): Payer: Self-pay

## 2015-06-11 ENCOUNTER — Ambulatory Visit (HOSPITAL_COMMUNITY)
Admission: RE | Admit: 2015-06-11 | Discharge: 2015-06-11 | Disposition: A | Discharge status: Home / Self Care | Payer: 59 | Source: Ambulatory Visit | Attending: Cardiology | Admitting: Cardiology

## 2015-06-11 VITALS — BP 118/64 | HR 103 | Resp 18 | Wt 194.2 lb

## 2015-06-11 DIAGNOSIS — Z79899 Other long term (current) drug therapy: Secondary | ICD-10-CM | POA: Diagnosis not present

## 2015-06-11 DIAGNOSIS — Z794 Long term (current) use of insulin: Secondary | ICD-10-CM | POA: Insufficient documentation

## 2015-06-11 DIAGNOSIS — I132 Hypertensive heart and chronic kidney disease with heart failure and with stage 5 chronic kidney disease, or end stage renal disease: Secondary | ICD-10-CM | POA: Diagnosis not present

## 2015-06-11 DIAGNOSIS — Z992 Dependence on renal dialysis: Secondary | ICD-10-CM | POA: Diagnosis not present

## 2015-06-11 DIAGNOSIS — E1122 Type 2 diabetes mellitus with diabetic chronic kidney disease: Secondary | ICD-10-CM | POA: Diagnosis not present

## 2015-06-11 DIAGNOSIS — I4891 Unspecified atrial fibrillation: Secondary | ICD-10-CM

## 2015-06-11 DIAGNOSIS — I272 Other secondary pulmonary hypertension: Secondary | ICD-10-CM | POA: Diagnosis not present

## 2015-06-11 DIAGNOSIS — Z841 Family history of disorders of kidney and ureter: Secondary | ICD-10-CM | POA: Diagnosis not present

## 2015-06-11 DIAGNOSIS — N186 End stage renal disease: Secondary | ICD-10-CM | POA: Insufficient documentation

## 2015-06-11 DIAGNOSIS — Z87891 Personal history of nicotine dependence: Secondary | ICD-10-CM | POA: Diagnosis not present

## 2015-06-11 DIAGNOSIS — Z7901 Long term (current) use of anticoagulants: Secondary | ICD-10-CM | POA: Diagnosis not present

## 2015-06-11 DIAGNOSIS — I4821 Permanent atrial fibrillation: Secondary | ICD-10-CM

## 2015-06-11 DIAGNOSIS — I5032 Chronic diastolic (congestive) heart failure: Secondary | ICD-10-CM

## 2015-06-11 DIAGNOSIS — Z9981 Dependence on supplemental oxygen: Secondary | ICD-10-CM | POA: Insufficient documentation

## 2015-06-11 DIAGNOSIS — I480 Paroxysmal atrial fibrillation: Secondary | ICD-10-CM | POA: Insufficient documentation

## 2015-06-11 DIAGNOSIS — J9611 Chronic respiratory failure with hypoxia: Secondary | ICD-10-CM | POA: Insufficient documentation

## 2015-06-11 DIAGNOSIS — I482 Chronic atrial fibrillation: Secondary | ICD-10-CM

## 2015-06-11 LAB — PROTIME-INR
INR: 2.07 — AB (ref 0.00–1.49)
PROTHROMBIN TIME: 23.1 s — AB (ref 11.6–15.2)

## 2015-06-11 NOTE — Patient Instructions (Signed)
Will refer you to Pulmonary Rehab.  Follow up 4 months.  Do the following things EVERYDAY: 1) Weigh yourself in the morning before breakfast. Write it down and keep it in a log. 2) Take your medicines as prescribed 3) Eat low salt foods-Limit salt (sodium) to 2000 mg per day.  4) Stay as active as you can everyday 5) Limit all fluids for the day to less than 2 liters

## 2015-06-12 NOTE — Progress Notes (Signed)
ADVANCED HF CLINIC NOTE  Patient ID: OSRIC COUPLAND, male   DOB: 1955/11/12, 60 y.o.   MRN: [id number] Referring MD: Dr. Caryl Comes Nephrology: Dr Joelyn Oms CHF: Aundra Dubin  60 yo with history of ESRD, paroxysmal atrial fibrillation, and chronic diastolic CHF was referred to CHF clinic for evaluation of CHF and elevated PA pressure on echo.  Patient has had renal failure, probably due to HTN and diabetes.  He follows with Dr. Florene Glen.  He has been in atrial fibrillation since 6/13 when he failed a cardioversion and it was decided to leave him in atrial fibrillation.  He is now on coumadin.  Echo in 4/15 showed EF 0000000 with PA systolic pressure 85 mmHg.   RHC in 6/15 showing elevated right and left heart filling pressures with moderate pulmonary hypertension, primarily pulmonary venous hypertension.  He has been seen at [facility] for possible Renal transplant but told he is not a candidate due to high PA pressures.   He was admitted in 11/15 with acute on chronic diastolic CHF and AKI on CKD. He was started on oxygen.  He was started on hemodialysis for volume removal at that time.    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 Went to [facility], discharged home on 9/17.   He remains on home oxygen.  PFTs in 12/16 showed moderate-severe obstructive disease with concurrent restriction and decreased DLCO.  Echo in 12/6 showed EF 50-55%, PASP 38 mmHg with mildly decreased RV systolic function.  RHC was done in 1/17, showing mild pulmonary hypertension.  High resolution CT was not significantly abnormal.  He saw Dr Lake Bells with pulmonary and was thought to possibly have a post-infectious bronchiolitis causing his oxygen requirement.  He is now on Symbicort and feels like his breathing is a bit better.  He is walking without oxygen and denies exertional dyspnea. No chest pain.   No lightheadedness.  He does not feel palpitations.  At last appointment, he was in atrial fibrillation. Amiodarone was increased to 200 mg bid and he was brought in for DCCV.  However, he was back in NSR.  Amiodarone was decreased back to once daily.   ECG: NSR, inferior slight ST depression.  Labs (8/14): K 4.4, creatinine 3.7 Labs (6/15): K 4.5, creatinine 4.88 Labs (10/30/13) : K 4.1 Creatinine 4.22 Labs (01/15/14): K 3.8 creatinine 5.57 Labs (12/15): HCT 30.9 Labs (12/16): TSH normal Labs (1/17): LFTs normal  PMH: 1. CKD: Followed by Dr. Florene Glen, likely due to HTN and diabetes. Progression to ESRD in 11/15.  2. Atrial fibrillation: Paroxysmal.  DCCV attempted in 6/13 but reverted to atrial fibrillation.  Now rhythm control with amiodarone. 3. H/o heavy ETOH, now does not drink. 4. Type II diabetes 5. HTN 6. Chronic diastolic CHF with elevated PA pressure: Echo (4/15) with EF 55-60%, mild MR, PA systolic pressure 85 mmHg, RV normal per report. RHC (6/15) with mean RA 12, PA 68/28 mean 44, mean PCWP 23 with prominent v-waves, CI 3.5, PVR 2.8 WU. Echo (12/16) with EF 50-55%, moderate LVH, normal RV size with mildly decreased systolic function.  PASP 38 mmHg.  RHC (1/17) with mean RA 9, PA 42/22 mean 30, mean PCWP 14, CI 3, PVR 2.6 WU.  7. Chronic hypoxic respiratory failure: He has been on home oxygen since hospitalization with PNA.   - PFTs (12/16) with mod-severe obstructive disease suggestive of emphysema but also concurrent  restriction and decreased DLCO (30%).    - High resolution CT (1/17): Not significantly abnormal.  - Suspect post-infectious bronchiolitis.  8. Pulmonary hypertension: RHC (1/17) with mean RA 9, PA 42/22 mean 30, mean PCWP 14, CI 3, PVR 2.6 WU.  Mild, probably related to underlying lung disease (Group 3).   - V/Q scan (2/17) with no evidence for chronic PE.   SH: On disability, prior smoker but quit (he was never a heavy smoker), prior heavy ETOH but quit, lives in  [place].  FH: Mother with valve replacement, sister with ESRD.   ROS: All systems reviewed and negative except as per HPI.   Current Outpatient Prescriptions  Medication Sig Dispense Refill  . acetaminophen (TYLENOL) 500 MG tablet Take 1,000 mg by mouth every 6 (six) hours as needed (pain). Reported on 05/09/2015    . amiodarone (PACERONE) 200 MG tablet Take 200 mg by mouth daily.    Marland Kitchen atorvastatin (LIPITOR) 40 MG tablet Take 1 tablet (40 mg total) by mouth at bedtime. 30 tablet 1  . budesonide-formoterol (SYMBICORT) 80-4.5 MCG/ACT inhaler Inhale 2 puffs into the lungs 2 (two) times daily. 1 Inhaler 5  . calcitRIOL (ROCALTROL) 0.25 MCG capsule Take 7 capsules (1.75 mcg total) by mouth every Monday, Wednesday, and Friday with hemodialysis.    Marland Kitchen colchicine 0.6 MG tablet Take 0.6 mg by mouth 2 (two) times daily.    . insulin glargine (LANTUS) 100 UNIT/ML injection Inject 0.17 mLs (17 Units total) into the skin at bedtime. 10 mL 11  . insulin glargine (LANTUS) 100 unit/mL SOPN Inject 17-20 Units into the skin daily before supper.     Marland Kitchen ketorolac (ACULAR) 0.5 % ophthalmic solution Place 1 drop into the right eye See admin instructions. Instill 1 drop into right eye 4 times daily starting 72 hours prior to surgery  4  . levETIRAcetam (KEPPRA) 500 MG tablet Take 500 mg by mouth as directed. 500 mg twice a day. Mon, Wed, Fri on dialysis days an additional 250 mg with supper.    . loperamide (IMODIUM) 1 MG/5ML solution Take 6 mg by mouth 2 (two) times daily as needed for diarrhea or loose stools.    . metoprolol tartrate (LOPRESSOR) 25 MG tablet Take 25 mg by mouth as directed. Take 1 tablet (25 mg) twice daily on Monday, Wednesday, and Friday (Dialysis days) and take 2 tablets (50 mg) twice daily on Sunday, Tuesday, Thursday, Saturday    . multivitamin (RENA-VIT) TABS tablet Take 1 tablet by mouth at bedtime. (Patient taking differently: Take 1 tablet by mouth daily with supper. ) 30 tablet 0  .  ofloxacin (OCUFLOX) 0.3 % ophthalmic solution Place 1 drop into both eyes See admin instructions. Reported on 05/09/2015  1  . prednisoLONE acetate (PRED FORTE) 1 % ophthalmic suspension Place 1 drop into the right eye See admin instructions. Reported on 05/09/2015  1  . sevelamer carbonate (RENVELA) 800 MG tablet Take 800-2,400 mg by mouth See admin instructions. Take 3 tablets (2400 mg) by mouth with meals and 1 tablet (800 mg) with snacks - total of 8 tablets daily    . warfarin (COUMADIN) 3 MG tablet Take as directed by coumadin clinic (Patient taking differently: Take 6 mg by mouth daily at 6 PM. or as directed by coumadin clinic) 65 tablet 3   No current facility-administered medications for this encounter.    BP 118/64 mmHg  Pulse 103  Resp 18  Wt 194 lb 4 oz (88.111 kg)  SpO2  90% General: NAD  Neck: Supple, JVP flat.  No thyromegaly or thyroid nodule.  Lungs: CTAB Cardiac: PMI normal.  Regular S1S2, no S3S4. 2/6 TR murmur, no ankle edema.  No carotid bruit.  Normal pedal pulses.  Abdomen: Soft, nontender, no hepatosplenomegaly, nondistended.  Skin: Intact without lesions or rashes.  Neurologic: Alert and oriented x 3.  Psych: Normal affect. Extremities: No clubbing or cyanosis. L wrist fistula  Assessment/Plan: 1. Chronic diastolic CHF: Volume status well controlled with HD at this point. 2. ESRD: MWF HD.  3. Atrial fibrillation: Back in NSR on amiodarone.    - Continue warfarin. - Continue amiodarone 200 mg daily.  He had a recent pulmonary evaluation, and given significant clearance of chest CT since his PNA in the fall, Dr Lake Bells thought amiodarone lung toxicity was unlikely. Recent LFTs and TSH normal.  He will need regular eye exams while on amiodarone.  - Continue current metoprolol.   4. Pulmonary HTN: Primarily pulmonary venous hypertension on 6/15 RHC.  With dialysis, this improved.  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  V/Q scan did not show chronic PE.  He was turned down for renal transplant due to pulmonary hypertension.  I do not think this would be a limiting factor at this point.  - Sleep study planned in 3/17. - He is trying to contact [facility] to set up transplant evaluation.  5. Chronic hypoxemic respiratory failure:  Etiology is uncertain.  He is not volume overloaded at this point.  He saw pulmonary (Dr Lake Bells) and was thought to have post-infectious bronchiolitis as the primary cause of hypoxemia. Amiodarone toxicity thought unlikely.  He is not using oxygen now when he is active.   Followup in 4 months.   [name] 06/12/2015

## 2015-06-18 ENCOUNTER — Other Ambulatory Visit (HOSPITAL_COMMUNITY): Payer: Self-pay

## 2015-06-18 DIAGNOSIS — R0600 Dyspnea, unspecified: Secondary | ICD-10-CM

## 2015-06-24 ENCOUNTER — Encounter (HOSPITAL_BASED_OUTPATIENT_CLINIC_OR_DEPARTMENT_OTHER): Payer: 59

## 2015-06-27 ENCOUNTER — Institutional Professional Consult (permissible substitution): Payer: 59 | Admitting: Internal Medicine

## 2015-06-28 ENCOUNTER — Other Ambulatory Visit: Payer: Self-pay | Admitting: Surgery

## 2015-06-28 MED ORDER — WARFARIN SODIUM 3 MG PO TABS: ORAL_TABLET | ORAL | Status: AC

## 2015-07-05 ENCOUNTER — Ambulatory Visit (HOSPITAL_BASED_OUTPATIENT_CLINIC_OR_DEPARTMENT_OTHER): Payer: 59

## 2015-07-09 ENCOUNTER — Telehealth: Payer: Self-pay | Admitting: Cardiology

## 2015-07-09 ENCOUNTER — Ambulatory Visit (INDEPENDENT_AMBULATORY_CARE_PROVIDER_SITE_OTHER): Payer: 59 | Admitting: *Deleted

## 2015-07-09 DIAGNOSIS — I482 Chronic atrial fibrillation, unspecified: Secondary | ICD-10-CM

## 2015-07-09 DIAGNOSIS — I4821 Permanent atrial fibrillation: Secondary | ICD-10-CM

## 2015-07-09 LAB — POCT INR: INR: 6.7

## 2015-07-09 LAB — PROTIME-INR
INR: 5.4 — AB (ref 0.00–1.49)
Prothrombin Time: 47.6 seconds — ABNORMAL HIGH (ref 11.6–15.2)

## 2015-07-09 NOTE — Telephone Encounter (Signed)
[facility] for INR was 5.40, not as high as office but I called wife and reminded them not to take tonight and call office in am for further instructions.

## 2015-07-10 NOTE — Telephone Encounter (Signed)
Coumadin Clinic spoke with pt today with further instructions.  Please see anti-coag note for details.

## 2015-07-16 ENCOUNTER — Institutional Professional Consult (permissible substitution): Payer: 59 | Admitting: Pulmonary Disease

## 2015-07-18 ENCOUNTER — Ambulatory Visit (INDEPENDENT_AMBULATORY_CARE_PROVIDER_SITE_OTHER): Payer: 59 | Admitting: *Deleted

## 2015-07-18 DIAGNOSIS — I4821 Permanent atrial fibrillation: Secondary | ICD-10-CM

## 2015-07-18 DIAGNOSIS — I482 Chronic atrial fibrillation, unspecified: Secondary | ICD-10-CM

## 2015-07-18 LAB — POCT INR: INR: 3.4

## 2015-07-30 ENCOUNTER — Ambulatory Visit (INDEPENDENT_AMBULATORY_CARE_PROVIDER_SITE_OTHER): Payer: 59 | Admitting: Pharmacist

## 2015-07-30 DIAGNOSIS — I4821 Permanent atrial fibrillation: Secondary | ICD-10-CM

## 2015-07-30 DIAGNOSIS — I482 Chronic atrial fibrillation, unspecified: Secondary | ICD-10-CM

## 2015-07-30 LAB — POCT INR: INR: 3.6

## 2015-08-02 ENCOUNTER — Encounter (HOSPITAL_COMMUNITY)
Admission: RE | Admit: 2015-08-02 | Discharge: 2015-08-02 | Disposition: A | Discharge status: Home / Self Care | Payer: 59 | Source: Ambulatory Visit | Attending: Cardiology | Admitting: Cardiology

## 2015-08-02 ENCOUNTER — Encounter (HOSPITAL_COMMUNITY): Payer: Self-pay

## 2015-08-02 VITALS — BP 155/84 | HR 77 | Resp 18 | Ht 72.0 in | Wt 199.1 lb

## 2015-08-02 DIAGNOSIS — Z79899 Other long term (current) drug therapy: Secondary | ICD-10-CM | POA: Insufficient documentation

## 2015-08-02 DIAGNOSIS — E1142 Type 2 diabetes mellitus with diabetic polyneuropathy: Secondary | ICD-10-CM | POA: Insufficient documentation

## 2015-08-02 DIAGNOSIS — I120 Hypertensive chronic kidney disease with stage 5 chronic kidney disease or end stage renal disease: Secondary | ICD-10-CM | POA: Insufficient documentation

## 2015-08-02 DIAGNOSIS — Z7901 Long term (current) use of anticoagulants: Secondary | ICD-10-CM | POA: Insufficient documentation

## 2015-08-02 DIAGNOSIS — R0600 Dyspnea, unspecified: Secondary | ICD-10-CM | POA: Diagnosis not present

## 2015-08-02 DIAGNOSIS — E1122 Type 2 diabetes mellitus with diabetic chronic kidney disease: Secondary | ICD-10-CM | POA: Diagnosis not present

## 2015-08-02 DIAGNOSIS — G4730 Sleep apnea, unspecified: Secondary | ICD-10-CM | POA: Insufficient documentation

## 2015-08-02 DIAGNOSIS — Z992 Dependence on renal dialysis: Secondary | ICD-10-CM | POA: Insufficient documentation

## 2015-08-02 DIAGNOSIS — N186 End stage renal disease: Secondary | ICD-10-CM | POA: Insufficient documentation

## 2015-08-02 DIAGNOSIS — Z87891 Personal history of nicotine dependence: Secondary | ICD-10-CM | POA: Diagnosis not present

## 2015-08-02 DIAGNOSIS — R569 Unspecified convulsions: Secondary | ICD-10-CM | POA: Insufficient documentation

## 2015-08-02 DIAGNOSIS — Z794 Long term (current) use of insulin: Secondary | ICD-10-CM | POA: Diagnosis not present

## 2015-08-02 LAB — GLUCOSE, CAPILLARY: Glucose-Capillary: 118 mg/dL — ABNORMAL HIGH (ref 65–99)

## 2015-08-02 NOTE — Progress Notes (Signed)
Pulmonary Individual Treatment Plan  Patient Details  Name: Joseph [name] MRN: [id number] Date of Birth: [birth year] Referring Provider:    Initial Encounter Date:   Visit Diagnosis: Dyspnea  Patient's Home Medications on Admission:   Current outpatient prescriptions:  .  acetaminophen (TYLENOL) 500 MG tablet, Take 1,000 mg by mouth every 6 (six) hours as needed (pain). Reported on 05/09/2015, Disp: , Rfl:  .  amiodarone (PACERONE) 200 MG tablet, Take 200 mg by mouth daily., Disp: , Rfl:  .  atorvastatin (LIPITOR) 40 MG tablet, Take 1 tablet (40 mg total) by mouth at bedtime., Disp: 30 tablet, Rfl: 1 .  B Complex-C-Folic Acid (VOL-CARE RX) 1 MG TABS, Take 1 tablet by mouth at bedtime., Disp: , Rfl: 11 .  B-D ULTRAFINE III SHORT PEN 31G X 8 MM MISC, USE AS DIRECTED ONCE A DAY, Disp: , Rfl: 11 .  budesonide-formoterol (SYMBICORT) 80-4.5 MCG/ACT inhaler, Inhale 2 puffs into the lungs 2 (two) times daily., Disp: 1 Inhaler, Rfl: 5 .  calcitRIOL (ROCALTROL) 0.25 MCG capsule, Take 7 capsules (1.75 mcg total) by mouth every Monday, Wednesday, and Friday with hemodialysis., Disp: , Rfl:  .  colchicine 0.6 MG tablet, Take 0.6 mg by mouth 2 (two) times daily., Disp: , Rfl:  .  ethyl chloride spray, USE AS DIRECTED FOR DIALYSIS, Disp: , Rfl: 3 .  insulin glargine (LANTUS) 100 unit/mL SOPN, Inject 17-20 Units into the skin daily before supper. , Disp: , Rfl:  .  ketorolac (ACULAR) 0.5 % ophthalmic solution, Place 1 drop into the right eye See admin instructions. Instill 1 drop into right eye 4 times daily starting 72 hours prior to surgery, Disp: , Rfl: 4 .  levETIRAcetam (KEPPRA) 500 MG tablet, Take 500 mg by mouth as directed. 500 mg twice a day. Mon, Wed, Fri on dialysis days an additional 250 mg with supper., Disp: , Rfl:  .  loperamide (IMODIUM) 1 MG/5ML solution, Take 6 mg by mouth 2 (two) times daily as needed for diarrhea or loose stools., Disp: , Rfl:  .  multivitamin (RENA-VIT) TABS tablet, Take 1  tablet by mouth at bedtime. (Patient taking differently: Take 1 tablet by mouth daily with supper. ), Disp: 30 tablet, Rfl: 0 .  prednisoLONE acetate (PRED FORTE) 1 % ophthalmic suspension, Place 1 drop into the right eye See admin instructions. Reported on 05/09/2015, Disp: , Rfl: 1 .  sevelamer carbonate (RENVELA) 800 MG tablet, Take 800-2,400 mg by mouth See admin instructions. Take 3 tablets (2400 mg) by mouth with meals and 1 tablet (800 mg) with snacks - total of 8 tablets daily, Disp: , Rfl:  .  warfarin (COUMADIN) 3 MG tablet, Take as directed by coumadin clinic, Disp: 65 tablet, Rfl: 3 .  insulin glargine (LANTUS) 100 UNIT/ML injection, Inject 0.17 mLs (17 Units total) into the skin at bedtime. (Patient not taking: Reported on 08/02/2015), Disp: 10 mL, Rfl: 11 .  metoprolol tartrate (LOPRESSOR) 25 MG tablet, Take 25 mg by mouth as directed. Take 1 tablet (25 mg) twice daily on Monday, Wednesday, and Friday (Dialysis days) and take 2 tablets (50 mg) twice daily on Sunday, Tuesday, Thursday, Saturday, Disp: , Rfl:  .  ofloxacin (OCUFLOX) 0.3 % ophthalmic solution, Place 1 drop into both eyes See admin instructions. Reported on 08/02/2015, Disp: , Rfl: 1  Past Medical History: Past Medical History  Diagnosis Date  . Atrial fibrillation ([place])     a. 11/18/2011 s/p DCCV - 150J;  b. Anticoagulation w/  Apixaban;  c. 11/2011 back in afib->asymptomatic.  . H/O alcohol abuse     a. drinks heavily on the weekends.  . Hypertension   . Diabetes mellitus ([place])   . Heart murmur     a. 09/2011 Echo: EF 55-60%, Triv AI, Mild MR, mildly dil LA.  . Diabetic peripheral neuropathy ([place])   . CKD (chronic kidney disease), stage III   . CHF (congestive heart failure) ([place])   . Pneumonia   . Sleep apnea   . Seizure ([place])   . 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 Former Smoker -- 0.01 packs/day for 5 years  . Types: Cigarettes   . Quit date: 04/20/1994  Smokeless tobacco  . Never Used    Labs:     Recent Review Flowsheet Data    Labs for ITP Cardiac and Pulmonary Rehab Latest Ref Rng 12/19/2014 12/20/2014 12/21/2014 12/22/2014 04/25/2015   HCO3 20.0 - 24.0 mEq/L - - - - 32.6(H)   TCO2 0 - 100 mmol/L 25 22 23 26  34   O2SAT - - - - - 67.0      Capillary Blood Glucose: Lab Results  Component Value Date   GLUCAP 118* 08/02/2015   GLUCAP 121* 04/25/2015   GLUCAP 109* 04/25/2015   GLUCAP 150* 01/05/2015   GLUCAP 120* 01/04/2015     ADL [facility]:     Pulmonary Assessment Scores      08/06/15 1213       ADL [facility]   SOB Score total 99        Pulmonary Function Assessment:     Pulmonary Function Assessment - 08/02/15 1050    Breath   Bilateral Breath Sounds Clear   Shortness of Breath Yes      Exercise Target Goals:    Exercise Program Goal: Individual exercise prescription set with THRR, safety & activity barriers. Participant demonstrates ability to understand and report RPE using BORG scale, to self-measure pulse accurately, and to acknowledge the importance of the exercise prescription.  Exercise Prescription Goal: Starting with aerobic activity 30 plus minutes a day, 3 days per week for initial exercise prescription. Provide home exercise prescription and guidelines that participant acknowledges understanding prior to discharge.  Activity Barriers & Risk Stratification:     Activity Barriers & Cardiac Risk Stratification - 08/02/15 1048    Activity Barriers & Cardiac Risk Stratification   Activity Barriers Deconditioning;Shortness of Breath      6 Minute Walk:     6 Minute Walk      08/06/15 1210       6 Minute Walk   Phase Initial     Distance 950 feet     Walk Time 6 minutes     # of Rest Breaks 0     MPH 1.8     METS 3.05     RPE 11     Perceived Dyspnea  0     VO2 Peak 10.69     Symptoms No     Resting HR 92 bpm     Resting BP 126/70 mmHg     Max Ex. HR 98 bpm      Max Ex. BP 148/74 mmHg     Interval HR   Baseline HR 92     2 Minute HR 85     4 Minute HR 90     6 Minute HR 87  Interval Heart Rate? Yes     Interval Oxygen   Baseline Oxygen Saturation % 93 %     Baseline Liters of Oxygen 2 L     1 Minute Oxygen Saturation % 95 %     1 Minute Liters of Oxygen 2 L     2 Minute Oxygen Saturation % 94 %     2 Minute Liters of Oxygen 2 L     3 Minute Oxygen Saturation % 91 %     3 Minute Liters of Oxygen 2 L     4 Minute Oxygen Saturation % 90 %     4 Minute Liters of Oxygen 2 L     5 Minute Oxygen Saturation % 88 %     5 Minute Liters of Oxygen 2 L     6 Minute Oxygen Saturation % 87 %     6 Minute Liters of Oxygen 2 L     2 Minute Post Oxygen Saturation % 86 %     2 Minute Post Liters of Oxygen 2 L        Initial Exercise Prescription:     Initial Exercise Prescription - 08/06/15 1600    Date of Initial Exercise RX and Referring Provider   Date 08/06/15   Referring Provider Dr. Aundra Dubin   Oxygen   Oxygen Continuous   Liters 2   Bike   Level 0.6   Minutes 15   NuStep   Level 2   Minutes 15   METs 1.5   Track   Laps 5   Minutes 15   Prescription Details   Frequency (times per week) 2   Duration Progress to 45 minutes of aerobic exercise without signs/symptoms of physical distress   Intensity   THRR 40-80% of Max Heartrate 64-128   Ratings of Perceived Exertion 11-13   Perceived Dyspnea 0-4   Progression   Progression Continue progressive overload as per policy without signs/symptoms or physical distress.   Resistance Training   Training Prescription Yes   Weight blue bands   Reps 10-12      Perform Capillary Blood Glucose checks as needed.  Exercise Prescription Changes:   Exercise Comments:   Discharge Exercise Prescription (Final Exercise Prescription Changes):    Nutrition:  Target Goals: Understanding of nutrition guidelines, daily intake of sodium 1500mg , cholesterol 200mg , calories 30% from fat and  7% or less from saturated fats, daily to have 5 or more servings of fruits and vegetables.  Biometrics:     Pre Biometrics - 08/06/15 1220    Pre Biometrics   Grip Strength 40 kg       Nutrition Therapy Plan and Nutrition Goals:   Nutrition Discharge: Rate Your Plate Scores:   Psychosocial: Target Goals: Acknowledge presence or absence of depression, maximize coping skills, provide positive support system. Participant is able to verbalize types and ability to use techniques and skills needed for reducing stress and depression.  Initial Review & Psychosocial Screening:     Initial Psych Review & Screening - 08/02/15 [place]? Yes   Barriers   Psychosocial barriers to participate in program There are no identifiable barriers or psychosocial needs.   Screening Interventions   Interventions Encouraged to exercise      Quality of Life Scores:     Quality of Life - 08/06/15 1213    Quality of Life Scores   Health/Function Pre 26 %   Socioeconomic Pre  23.33 %   Psych/Spiritual Pre 29 %   Family Pre 20 %   GLOBAL Pre 25.27 %      PHQ-9:     Recent Review Flowsheet Data    Depression screen [facility] 2/9 08/02/2015 05/17/2014   Decreased Interest 0 0   Down, Depressed, Hopeless 0 0   PHQ - 2 Score 0 0      Psychosocial Evaluation and Intervention:   Psychosocial Re-Evaluation:  Education: Education Goals: Education classes will be provided on a weekly basis, covering required topics. Participant will state understanding/return demonstration of topics presented.  Learning Barriers/Preferences:     Learning Barriers/Preferences - 08/02/15 1049    Learning Barriers/Preferences   Learning Barriers Sight  memory   Learning Preferences Individual Instruction;Written Material;Skilled Demonstration;Verbal Instruction      Education Topics: Risk Factor Reduction:  -Group instruction that is supported by a PowerPoint  presentation. Instructor discusses the definition of a risk factor, different risk factors for pulmonary disease, and how the heart and lungs work together.     Nutrition for Pulmonary Patient:  -Group instruction provided by PowerPoint slides, verbal discussion, and written materials to support subject matter. The instructor gives an explanation and review of healthy diet recommendations, which includes a discussion on weight management, recommendations for fruit and vegetable consumption, as well as protein, fluid, caffeine, fiber, sodium, sugar, and alcohol. Tips for eating when patients are short of breath are discussed.   Pursed Lip Breathing:  -Group instruction that is supported by demonstration and informational handouts. Instructor discusses the benefits of pursed lip and diaphragmatic breathing and detailed demonstration on how to preform both.     Oxygen Safety:  -Group instruction provided by PowerPoint, verbal discussion, and written material to support subject matter. There is an overview of "What is Oxygen" and "Why do we need it".  Instructor also reviews how to create a safe environment for oxygen use, the importance of using oxygen as prescribed, and the risks of noncompliance. There is a brief discussion on traveling with oxygen and resources the patient may utilize.   Oxygen Equipment:  -Group instruction provided by [employer] Staff utilizing handouts, written materials, and equipment demonstrations.   Signs and Symptoms:  -Group instruction provided by written material and verbal discussion to support subject matter. Warning signs and symptoms of infection, stroke, and heart attack are reviewed and when to call the physician/911 reinforced. Tips for preventing the spread of infection discussed.   Advanced Directives:  -Group instruction provided by verbal instruction and written material to support subject matter. Instructor reviews Advanced Directive laws and proper  instruction for filling out document.   Pulmonary Video:  -Group video education that reviews the importance of medication and oxygen compliance, exercise, good nutrition, pulmonary hygiene, and pursed lip and diaphragmatic breathing for the pulmonary patient.   Exercise for the Pulmonary Patient:  -Group instruction that is supported by a PowerPoint presentation. Instructor discusses benefits of exercise, core components of exercise, frequency, duration, and intensity of an exercise routine, importance of utilizing pulse oximetry during exercise, safety while exercising, and options of places to exercise outside of rehab.     Pulmonary Medications:  -Verbally interactive group education provided by instructor with focus on inhaled medications and proper administration.   Anatomy and Physiology of the Respiratory System and Intimacy:  -Group instruction provided by PowerPoint, verbal discussion, and written material to support subject matter. Instructor reviews respiratory cycle and anatomical components of the respiratory system and their functions. Instructor  also reviews differences in obstructive and restrictive respiratory diseases with examples of each. Intimacy, Sex, and Sexuality differences are reviewed with a discussion on how relationships can change when diagnosed with pulmonary disease. Common sexual concerns are reviewed.   Knowledge Questionnaire Score:     Knowledge Questionnaire Score - 08/06/15 1213    Knowledge Questionnaire Score   Pre Score 2/13      Core Components/Risk Factors/Patient Goals at Admission:     Personal Goals and Risk Factors at Admission - 08/02/15 1116    Core Components/Risk Factors/Patient Goals on Admission    Weight Management Yes   Intervention Weight Management: Develop a combined nutrition and exercise program designed to reach desired caloric intake, while maintaining appropriate intake of nutrient and fiber, sodium and fats, and  appropriate energy expenditure required for the weight goal.;Weight Management: Provide education and appropriate resources to help participant work on and attain dietary goals.;Weight Management/Obesity: Establish reasonable short term and long term weight goals.   Expected Outcomes Weight Maintenance: Understanding of the daily nutrition guidelines, which includes 25-35% calories from fat, 7% or less cal from saturated fats, less than 200mg  cholesterol, less than 1.5gm of sodium, & 5 or more servings of fruits and vegetables daily;Understanding recommendations for meals to include 15-35% energy as protein, 25-35% energy from fat, 35-60% energy from carbohydrates, less than 200mg  of dietary cholesterol, 20-35 gm of total fiber daily;Understanding of distribution of calorie intake throughout the day with the consumption of 4-5 meals/snacks;Short Term: Continue to assess and modify interventions until short term weight is achieved;Long Term: Adherence to nutrition and physical activity/exercise program aimed toward attainment of established weight goal   Increase Strength and Stamina Yes   Intervention Provide advice, education, support and counseling about physical activity/exercise needs.;Develop an individualized exercise prescription for aerobic and resistive training based on initial evaluation findings, risk stratification, comorbidities and participant's personal goals.   Expected Outcomes Achievement of increased cardiorespiratory fitness and enhanced flexibility, muscular endurance and strength shown through measurements of functional capacity and personal statement of participant.   Improve shortness of breath with ADL's Yes   Intervention Provide education, individualized exercise plan and daily activity instruction to help decrease symptoms of SOB with activities of daily living.   Expected Outcomes Short Term: Achieves a reduction of symptoms when performing activities of daily living.   Develop  more efficient breathing techniques such as purse lipped breathing and diaphragmatic breathing; and practicing self-pacing with activity Yes   Intervention Provide education, demonstration and support about specific breathing techniuqes utilized for more efficient breathing. Include techniques such as pursed lipped breathing, diaphragmatic breathing and self-pacing activity.   Expected Outcomes Short Term: Participant will be able to demonstrate and use breathing techniques as needed throughout daily activities.   Diabetes Yes   Intervention Provide education about signs/symptoms and action to take for hypo/hyperglycemia.;Provide education about proper nutrition, including hydration, and aerobic/resistive exercise prescription along with prescribed medications to achieve blood glucose in normal ranges: Fasting glucose 65-99 mg/dL   Expected Outcomes Short Term: Participant verbalizes understanding of the signs/symptoms and immediate care of hyper/hypoglycemia, proper foot care and importance of medication, aerobic/resistive exercise and nutrition plan for blood glucose control.;Long Term: Attainment of HbA1C < 7%.   Hypertension Yes   Intervention Provide education on lifestyle modifcations including regular physical activity/exercise, weight management, moderate sodium restriction and increased consumption of fresh fruit, vegetables, and low fat dairy, alcohol moderation, and smoking cessation.;Monitor prescription use compliance.   Expected Outcomes Short Term: Continued  assessment and intervention until BP is < 140/25mm HG in hypertensive participants. < 130/97mm HG in hypertensive participants with diabetes, heart failure or chronic kidney disease.;Long Term: Maintenance of blood pressure at goal levels.   Personal Goal Other Yes   Personal Goal play golf again, increase stamina and strenght, maintain oxygenation on RA so that suplemental O2 can be discontinued   Intervention increase workloads on the  exercise equiptment as tolerated inorder to increase stamina and strength, encourage pursed lip breathing, do 6 min walk test off O2 to determine continuous need for O2   Expected Outcomes patient will be able to play golf this spring, will stated his stamina and strength have increased      Core Components/Risk Factors/Patient Goals Review:    Core Components/Risk Factors/Patient Goals at Discharge (Final Review):    ITP Comments:   Comments: Service time is from 0935 to [place] 60 y.o. male Pulmonary Rehab Orientation Note Patient arrived today in Cardiac and Pulmonary Rehab for orientation to Pulmonary Rehab. He was transported from [employer] via wheel chair by his wife. He does carry portable oxygen. Per pt, he uses oxygen continuously at 2 LPM. Patients wife states that Dr. [place] Bells has said patient could go without his O2 at rest but patient is unsure if he is ready to do so. His RA sats are 91%. Color good, skin warm and dry. Patient is oriented to time and place. He does have some short and long term memory deficits as a result of a recent stroke during hospitalization. Patient's medical history, psychosocial health, and medications reviewed. Psychosocial assessment reveals pt lives with their spouse. They have no children. Pt is currently unemployed, disabled since his stroke. Pt hobbies include playing golf and weight lifting. Pt reports his stress level is low.  Pt does not exhibit any signs of depression. PHQ2/9 score 0/na. Pt shows good  coping skills with positive outlook. He was offered emotional support and reassurance. Physical assessment reveals heart rate is normal, breath sounds clear to auscultation, no wheezes, rales, or rhonchi. Grip strength equal, strong. Distal pulses palpable. Positive bruie and thrill to fistula in left upper forearm.Patient reports he does take medications as prescribed. Patient states he follows a Low Sodium, Diabetic diet. The patient  reports no specific efforts to gain or lose weight.. Patient's weight will be monitored closely. Demonstration and practice of PLB using pulse oximeter. Patient able to return demonstration satisfactorily. Safety and hand hygiene in the exercise area reviewed with patient. Patient voices understanding of the information reviewed. Department expectations discussed with patient and achievable goals were set. The patient shows enthusiasm about attending the program and we look forward to working with this nice gentleman. The patient is scheduled for a 6 min walk test on Tuesday 4/18 at 1030 and to begin exercise on Tuesday 4/25 in the 1030 class.

## 2015-08-06 ENCOUNTER — Encounter (HOSPITAL_COMMUNITY)
Admission: RE | Admit: 2015-08-06 | Discharge: 2015-08-06 | Disposition: A | Discharge status: Home / Self Care | Payer: 59 | Source: Ambulatory Visit | Attending: Cardiology | Admitting: Cardiology

## 2015-08-06 DIAGNOSIS — R0600 Dyspnea, unspecified: Secondary | ICD-10-CM | POA: Diagnosis not present

## 2015-08-08 ENCOUNTER — Ambulatory Visit (INDEPENDENT_AMBULATORY_CARE_PROVIDER_SITE_OTHER): Payer: 59 | Admitting: Pulmonary Disease

## 2015-08-08 ENCOUNTER — Encounter: Payer: Self-pay | Admitting: Pulmonary Disease

## 2015-08-08 VITALS — BP 120/62 | HR 85 | Ht 72.0 in | Wt 196.0 lb

## 2015-08-08 DIAGNOSIS — J9611 Chronic respiratory failure with hypoxia: Secondary | ICD-10-CM

## 2015-08-08 DIAGNOSIS — J849 Interstitial pulmonary disease, unspecified: Secondary | ICD-10-CM | POA: Diagnosis not present

## 2015-08-08 DIAGNOSIS — R0602 Shortness of breath: Secondary | ICD-10-CM | POA: Diagnosis not present

## 2015-08-08 LAB — PULMONARY FUNCTION TEST
DL/VA % pred: 68 %
DL/VA: 3.24 ml/min/mmHg/L
DLCO UNC: 13.13 ml/min/mmHg
DLCO cor % pred: 40 %
DLCO cor: 14.19 ml/min/mmHg
DLCO unc % pred: 37 %
FEF 25-75 Post: 1.42 L/sec
FEF 25-75 Pre: 1.01 L/sec
FEF2575-%Change-Post: 39 %
FEF2575-%PRED-POST: 45 %
FEF2575-%Pred-Pre: 32 %
FEV1-%CHANGE-POST: 7 %
FEV1-%PRED-POST: 56 %
FEV1-%Pred-Pre: 52 %
FEV1-PRE: 1.76 L
FEV1-Post: 1.9 L
FEV1FVC-%Change-Post: 4 %
FEV1FVC-%Pred-Pre: 90 %
FEV6-%Change-Post: 3 %
FEV6-%PRED-POST: 61 %
FEV6-%PRED-PRE: 59 %
FEV6-POST: 2.58 L
FEV6-PRE: 2.48 L
FEV6FVC-%CHANGE-POST: 0 %
FEV6FVC-%PRED-POST: 103 %
FEV6FVC-%PRED-PRE: 102 %
FVC-%CHANGE-POST: 2 %
FVC-%PRED-POST: 59 %
FVC-%Pred-Pre: 57 %
FVC-Post: 2.58 L
FVC-Pre: 2.5 L
PRE FEV6/FVC RATIO: 99 %
Post FEV1/FVC ratio: 74 %
Post FEV6/FVC ratio: 100 %
Pre FEV1/FVC ratio: 70 %
RV % PRED: 96 %
RV: 2.28 L
TLC % PRED: 67 %
TLC: 4.96 L

## 2015-08-08 NOTE — Assessment & Plan Note (Signed)
Continue O2 as prescribed, he may need 3L with exercise at Pulmonary rehab, will have them titrate

## 2015-08-08 NOTE — Progress Notes (Signed)
Subjective:    Patient ID: Joseph [name], male    DOB: 06/13/1955, 60 y.o.   MRN: [id number]  Synopsis: Referred to [facility] in 2017 for evaluation of an abnormal CT chest, mild pulmonary hypertension, and abnormal function testing. Had an episode of pneumonia in 2016 with significant changes on CT chest which had largely resolved with the exception of some air trapping on a repeat CT chest imaging in 2017. On Symbicort in 2017. 11/2014 CT chest > diffuse GGO in upper lobes bilaterally, dense left lower lobe consolidation 03/2015 PFT> ratio 77%, FEV1 1.91 L (10% pred), FVC 2.48 L (61% pred), TLC 6.22 L (88% pred), DLCO 10.56 (32% pred) 04/2015 HRCT scan images personally reviewed, read by Dr.  There is very mild groundglass in patchy distributions in the upper lobes which has improved compare prior CT, There is no underlying emphysema but is one or 2 cysts seen. January 2017 rightt catheterization: Right Atrial pressure 9, right ventricle 47/10 pulmonary artery 42/22 (mean 30), PCWP 02 August 2015  07/2015 Pulmonary function testing FE V1 1.90 L, FVC 2.58 L ((59% predictive), normal ratio, total lung capacity 4.96 L (767% predicted post (, DLCO 13.13 (37% protected) .  HPI Chief Complaint  Patient presents with  . Follow-up    review PFT.  pt doing well, no breathing complaints today.     Joseph [name] says that he thinks that the Symbicort has been helping.  He has gone several hours at a time without the oxygen and he says he felt OK.  He uses the oxygen most of the time when he is walking around, but he doesn't use it at rest most of the time.  He feels like his breathing has improved.  He continues to go to pulmonary rehab, first full day is next Tuesday.  He had a 6 minute walk test.  Rare cough, not much sputum production.  His wife notes that his hemoglobin has been low in dialysis.   Past Medical History  Diagnosis Date  . Atrial fibrillation ([place])     a. 11/18/2011 s/p DCCV - 150J;  b.  Anticoagulation w/ Apixaban;  c. 11/2011 back in afib->asymptomatic.  . H/O alcohol abuse     a. drinks heavily on the weekends.  . Hypertension   . Diabetes mellitus ([place])   . Heart murmur     a. 09/2011 Echo: EF 55-60%, Triv AI, Mild MR, mildly dil LA.  . Diabetic peripheral neuropathy ([place])   . CKD (chronic kidney disease), stage III   . CHF (congestive heart failure) ([place])   . Pneumonia   . Sleep apnea   . Seizure ([place])   . 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  HENT: Positive for congestion, postnasal drip and sinus pressure. Negative for dental problem, ear pain, nosebleeds, rhinorrhea, sneezing, sore throat and trouble swallowing.   Respiratory: Positive for shortness of breath. Negative for cough, chest tightness and wheezing.   Cardiovascular: Negative for palpitations and leg swelling.  Skin: Negative for rash.       Objective:   Physical Exam Filed Vitals:   08/08/15 1000  BP: 120/62  Pulse: 85  Height: 6' (1.829 m)  Weight: 196 lb (88.905 kg)  SpO2: 91%   2L [place]  Gen: chronically ill appearing, no distress HENT: OP clear, TM's clear, neck  supple PULM: CTA B, normal percussion CV: RRR, slight systolic murmur, trace edema GI: BS+, soft, nontender Derm: no cyanosis or rash Psyche: normal mood and affect     11/2014 CT chest > diffuse GGO in upper lobes bilaterally, dense left lower lobe consolidation 03/2015 PFT> ratio 77%, FEV1 1.91 L (10% pred), FVC 2.48 L (61% pred), TLC 6.22 L (88% pred), DLCO 10.56 (32% pred) 04/2015 HRCT scan images personally reviewed, read by Dr.  There is very mild groundglass in patchy distributions in the upper lobes which has improved compare prior CT, There is no underlying emphysema but is one or 2 cysts seen. January 2017 rightt catheterization: Right Atrial pressure 9, right ventricle 47/10  pulmonary artery 42/22 (mean 30), PCWP 14 07/2015 Pulmonary function testing FE V1 1.90 L, FVC 2.58 L ((59% predictive), normal ratio, total lung capacity 4.96 L (767% predicted post (, DLCO 13.13 (37% protected) .    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 This finding can be associated with both pulmonary hypertension or respiratory bronchiolitis. In his particular case there is likely a component of both. I do not think that a VATS biopsy is necessary to prove this because his disease seems to be stable and he has noted symptomatic improvement with the addition of Symbicort.  Plan: I see no reason why he could not have a kidney transplant Continue monitoring for progression of his ill-defined lung disease with pulmonary function testing every 6 months Continue Symbicort Continue oxygen as prescribed, I believe he will likely need to use 3 L nasal cannula on exertion at pulmonary rehabilitation, we shall see Follow-up October with pulmonary function test  Chronic respiratory failure with hypoxia ([place]) Continue O2 as prescribed, he may need 3L with exercise at Pulmonary rehab, will have them titrate     Current outpatient prescriptions:  .  acetaminophen (TYLENOL) 500 MG tablet, Take 1,000 mg by mouth every 6 (six) hours as needed (pain). Reported on 05/09/2015, Disp: , Rfl:  .  amiodarone (PACERONE) 200 MG tablet, Take 200 mg by mouth daily., Disp: , Rfl:  .  atorvastatin (LIPITOR) 40 MG tablet, Take 1 tablet (40 mg total) by mouth at bedtime., Disp: 30 tablet, Rfl: 1 .  B Complex-C-Folic Acid (VOL-CARE RX) 1 MG TABS, Take 1 tablet by mouth at bedtime., Disp: , Rfl: 11 .  B-D ULTRAFINE III SHORT PEN 31G X 8 MM MISC, USE AS DIRECTED ONCE A DAY, Disp: , Rfl: 11 .  budesonide-formoterol (SYMBICORT) 80-4.5 MCG/ACT inhaler, Inhale 2 puffs into the lungs 2 (two) times daily., Disp: 1 Inhaler, Rfl: 5 .  calcitRIOL  (ROCALTROL) 0.25 MCG capsule, Take 7 capsules (1.75 mcg total) by mouth every Monday, Wednesday, and Friday with hemodialysis., Disp: , Rfl:  .  colchicine 0.6 MG tablet, Take 0.6 mg by mouth 2 (two) times daily., Disp: , Rfl:  .  ethyl chloride spray, USE AS DIRECTED FOR DIALYSIS, Disp: , Rfl: 3 .  insulin glargine (LANTUS) 100 unit/mL SOPN, Inject 17-20 Units into the skin daily before supper. , Disp: , Rfl:  .  ketorolac (ACULAR) 0.5 % ophthalmic solution, Place 1 drop into the right eye See admin instructions. Instill 1 drop into right eye 4 times daily starting 72 hours prior to surgery, Disp: , Rfl: 4 .  levETIRAcetam (KEPPRA) 500 MG tablet, Take 500 mg by mouth as directed. 500 mg twice a day. Mon, Wed, Fri  on dialysis days an additional 250 mg with supper., Disp: , Rfl:  .  loperamide (IMODIUM) 1 MG/5ML solution, Take 6 mg by mouth 2 (two) times daily as needed for diarrhea or loose stools., Disp: , Rfl:  .  metoprolol tartrate (LOPRESSOR) 25 MG tablet, Take 25 mg by mouth as directed. Take 1 tablet (25 mg) twice daily on Monday, Wednesday, and Friday (Dialysis days) and take 2 tablets (50 mg) twice daily on Sunday, Tuesday, Thursday, Saturday, Disp: , Rfl:  .  prednisoLONE acetate (PRED FORTE) 1 % ophthalmic suspension, Place 1 drop into the right eye See admin instructions. Reported on 05/09/2015, Disp: , Rfl: 1 .  sevelamer carbonate (RENVELA) 800 MG tablet, Take 800-2,400 mg by mouth See admin instructions. Take 3 tablets (2400 mg) by mouth with meals and 1 tablet (800 mg) with snacks - total of 8 tablets daily, Disp: , Rfl:  .  warfarin (COUMADIN) 3 MG tablet, Take as directed by coumadin clinic, Disp: 65 tablet, Rfl: 3

## 2015-08-08 NOTE — Assessment & Plan Note (Signed)
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 This finding can be associated with both pulmonary hypertension or respiratory bronchiolitis. In his particular case there is likely a component of both. I do not think that a VATS biopsy is necessary to prove this because his disease seems to be stable and he has noted symptomatic improvement with the addition of Symbicort.  Plan: I see no reason why he could not have a kidney transplant Continue monitoring for progression of his ill-defined lung disease with pulmonary function testing every 6 months Continue Symbicort Continue oxygen as prescribed, I believe he will likely need to use 3 L nasal cannula on exertion at pulmonary rehabilitation, we shall see Follow-up October with pulmonary function test

## 2015-08-08 NOTE — Patient Instructions (Signed)
Keep taking Symbicort as your doing Keep going to pulmonary rehabilitation We will see you back in 6 months with a pulmonary function test

## 2015-08-08 NOTE — Progress Notes (Signed)
PFT done today. 

## 2015-08-13 ENCOUNTER — Encounter (HOSPITAL_COMMUNITY)
Admission: RE | Admit: 2015-08-13 | Discharge: 2015-08-13 | Disposition: A | Discharge status: Home / Self Care | Payer: 59 | Source: Ambulatory Visit | Attending: Cardiology | Admitting: Cardiology

## 2015-08-13 ENCOUNTER — Ambulatory Visit (INDEPENDENT_AMBULATORY_CARE_PROVIDER_SITE_OTHER): Payer: 59 | Admitting: *Deleted

## 2015-08-13 VITALS — Wt 194.2 lb

## 2015-08-13 DIAGNOSIS — I482 Chronic atrial fibrillation, unspecified: Secondary | ICD-10-CM

## 2015-08-13 DIAGNOSIS — R0600 Dyspnea, unspecified: Secondary | ICD-10-CM

## 2015-08-13 DIAGNOSIS — I4821 Permanent atrial fibrillation: Secondary | ICD-10-CM

## 2015-08-13 LAB — POCT INR: INR: 5.8

## 2015-08-13 NOTE — Progress Notes (Signed)
Daily Session Note  Patient Details  Name: Joseph [name] MRN: [id number] Date of Birth: 1955/12/31 Referring Provider:        Pulmonary Rehab Walk Test from 08/06/2015 in [facility]   Referring Provider  Dr. Aundra Dubin      Encounter Date: 08/13/2015  Check In:     Session Check In - 08/13/15 1030    Check-In   Location [facility]   Staff Present Rosebud Poles, RN, BSN;Molly diVincenzo, MS, ACSM RCEP, Exercise Physiologist;Lisa Ysidro Evert, RN;Laurenashley Viar Rollene Rotunda, RN, BSN;Joann Rion, RN, BSN   Supervising physician immediately available to respond to emergencies [facility] immediately available   Physician(s) Dr. Marily Memos   Medication changes reported     No   Fall or balance concerns reported    No   Warm-up and Cool-down Performed as group-led instruction   Resistance Training Performed Yes   VAD Patient? No   Pain Assessment   Currently in Pain? No/denies   Multiple Pain Sites No      Capillary Blood Glucose: No results found for this or any previous visit (from the past 24 hour(s)).     POCT Glucose - 08/13/15 1307    POCT Blood Glucose   Pre-Exercise 216 mg/dL   Post-Exercise 162 mg/dL         Exercise Prescription Changes - 08/13/15 1300    Response to Exercise   Blood Pressure (Admit) 112/60 mmHg   Blood Pressure (Exercise) 158/84 mmHg   Blood Pressure (Exit) 104/60 mmHg   Heart Rate (Admit) 82 bpm   Heart Rate (Exercise) 90 bpm   Heart Rate (Exit) 78 bpm   Oxygen Saturation (Admit) 93 %   Oxygen Saturation (Exercise) 84 %   Oxygen Saturation (Exit) 100 %   Rating of Perceived Exertion (Exercise) 11   Perceived Dyspnea (Exercise) 0   Duration Progress to 45 minutes of aerobic exercise without signs/symptoms of physical distress   Intensity --  40-80% HRR   Resistance Training   Training Prescription Yes   Weight blue bands   Reps 10-12   Oxygen   Oxygen Continuous   Liters increased to 4   Bike   Level 0.6    Minutes 15   NuStep   Level 2   Minutes 15   METs 1.4   Track   Laps 6   Minutes 15     Goals Met:  Exercise tolerated well Queuing for purse lip breathing No report of cardiac concerns or symptoms Strength training completed today  Goals Unmet:  O2 Sat, O2 had to be increased to 4L to maintain saturations of >88%. Patient continued to desaturate with walking track until he took sitting restbreaks and utilized PLB.  Comments: Service time is from 1030 to 1205   Dr. Rush Farmer is Medical Director for Pulmonary Rehab at [facility].

## 2015-08-14 LAB — GLUCOSE, CAPILLARY
GLUCOSE-CAPILLARY: 162 mg/dL — AB (ref 65–99)
Glucose-Capillary: 216 mg/dL — ABNORMAL HIGH (ref 65–99)

## 2015-08-15 ENCOUNTER — Encounter (HOSPITAL_COMMUNITY)
Admission: RE | Admit: 2015-08-15 | Discharge: 2015-08-15 | Disposition: A | Discharge status: Home / Self Care | Payer: 59 | Source: Ambulatory Visit | Attending: Cardiology | Admitting: Cardiology

## 2015-08-20 ENCOUNTER — Encounter (HOSPITAL_COMMUNITY)
Admission: RE | Admit: 2015-08-20 | Discharge: 2015-08-20 | Disposition: A | Discharge status: Home / Self Care | Payer: 59 | Source: Ambulatory Visit | Attending: Cardiology | Admitting: Cardiology

## 2015-08-20 VITALS — Wt 196.7 lb

## 2015-08-20 DIAGNOSIS — Z794 Long term (current) use of insulin: Secondary | ICD-10-CM | POA: Insufficient documentation

## 2015-08-20 DIAGNOSIS — R0600 Dyspnea, unspecified: Secondary | ICD-10-CM | POA: Diagnosis present

## 2015-08-20 DIAGNOSIS — N186 End stage renal disease: Secondary | ICD-10-CM | POA: Diagnosis not present

## 2015-08-20 DIAGNOSIS — R569 Unspecified convulsions: Secondary | ICD-10-CM | POA: Diagnosis not present

## 2015-08-20 DIAGNOSIS — Z7901 Long term (current) use of anticoagulants: Secondary | ICD-10-CM | POA: Insufficient documentation

## 2015-08-20 DIAGNOSIS — E1122 Type 2 diabetes mellitus with diabetic chronic kidney disease: Secondary | ICD-10-CM | POA: Diagnosis not present

## 2015-08-20 DIAGNOSIS — E1142 Type 2 diabetes mellitus with diabetic polyneuropathy: Secondary | ICD-10-CM | POA: Insufficient documentation

## 2015-08-20 DIAGNOSIS — Z87891 Personal history of nicotine dependence: Secondary | ICD-10-CM | POA: Diagnosis not present

## 2015-08-20 DIAGNOSIS — G4730 Sleep apnea, unspecified: Secondary | ICD-10-CM | POA: Diagnosis not present

## 2015-08-20 DIAGNOSIS — Z79899 Other long term (current) drug therapy: Secondary | ICD-10-CM | POA: Diagnosis not present

## 2015-08-20 DIAGNOSIS — Z992 Dependence on renal dialysis: Secondary | ICD-10-CM | POA: Diagnosis not present

## 2015-08-20 DIAGNOSIS — I120 Hypertensive chronic kidney disease with stage 5 chronic kidney disease or end stage renal disease: Secondary | ICD-10-CM | POA: Diagnosis not present

## 2015-08-20 NOTE — Progress Notes (Signed)
Daily Session Note  Patient Details  Name: Joseph [name] MRN: [id number] Date of Birth: May 07, 1955 Referring Provider:        Pulmonary Rehab Walk Test from 08/06/2015 in [place]   Referring Provider  Dr. Aundra Dubin      Encounter Date: 08/20/2015  Check In:     Session Check In - 08/20/15 1236    Check-In   Location [facility]   Staff Present Rosebud Poles, RN, BSN;Molly diVincenzo, MS, ACSM RCEP, Exercise Physiologist;Olinty Celesta Aver, MS, ACSM CEP, Exercise Physiologist;Portia Rollene Rotunda, RN, BSN   Supervising physician immediately available to respond to emergencies [facility] immediately available   Physician(s) Dr Marily Memos   Medication changes reported     No   Fall or balance concerns reported    No   Warm-up and Cool-down Performed as group-led instruction   Resistance Training Performed Yes   VAD Patient? No   Pain Assessment   Currently in Pain? No/denies   Multiple Pain Sites No      Capillary Blood Glucose: No results found for this or any previous visit (from the past 24 hour(s)).     POCT Glucose - 08/20/15 1237    POCT Blood Glucose   Pre-Exercise 216 mg/dL   Post-Exercise 238 mg/dL         Exercise Prescription Changes - 08/20/15 1200    Exercise Review   Progression Yes   Response to Exercise   Blood Pressure (Admit) 102/60 mmHg   Blood Pressure (Exercise) 116/60 mmHg   Blood Pressure (Exit) 128/70 mmHg   Heart Rate (Admit) 79 bpm   Heart Rate (Exercise) 114 bpm   Heart Rate (Exit) 86 bpm   Oxygen Saturation (Admit) 99 %   Oxygen Saturation (Exercise) 88 %   Oxygen Saturation (Exit) 98 %   Rating of Perceived Exertion (Exercise) 13   Perceived Dyspnea (Exercise) 0   Duration Progress to 45 minutes of aerobic exercise without signs/symptoms of physical distress   Intensity THRR unchanged   Progression   Progression Continue progressive overload as per policy without signs/symptoms or physical  distress.   Resistance Training   Training Prescription Yes   Weight blue bands   Reps 10-12   Oxygen   Oxygen Continuous   Liters 4   Bike   Level 0.6   Minutes 15   NuStep   Level 3   Minutes 15   METs 2.1   Track   Laps 8   Minutes 15     Goals Met:  Exercise tolerated well No report of cardiac concerns or symptoms Strength training completed today  Goals Unmet:  O2 Sat. O2 increased to 4L today due to sat 86% with exercise.  Comments: Service time is from 1030 to 1210    Dr. Rush Farmer is Medical Director for Pulmonary Rehab at [facility].

## 2015-08-22 ENCOUNTER — Ambulatory Visit (INDEPENDENT_AMBULATORY_CARE_PROVIDER_SITE_OTHER): Payer: 59

## 2015-08-22 ENCOUNTER — Encounter (HOSPITAL_COMMUNITY)
Admission: RE | Admit: 2015-08-22 | Discharge: 2015-08-22 | Disposition: A | Discharge status: Home / Self Care | Payer: 59 | Source: Ambulatory Visit | Attending: Cardiology | Admitting: Cardiology

## 2015-08-22 DIAGNOSIS — I4821 Permanent atrial fibrillation: Secondary | ICD-10-CM

## 2015-08-22 DIAGNOSIS — R0600 Dyspnea, unspecified: Secondary | ICD-10-CM

## 2015-08-22 DIAGNOSIS — I482 Chronic atrial fibrillation, unspecified: Secondary | ICD-10-CM

## 2015-08-22 LAB — POCT INR: INR: 2.2

## 2015-08-22 LAB — GLUCOSE, CAPILLARY: Glucose-Capillary: 314 mg/dL — ABNORMAL HIGH (ref 65–99)

## 2015-08-22 NOTE — Progress Notes (Signed)
Incomplete Session Note  Patient Details  Name: Joseph [name] MRN: [id number] Date of Birth: October 19, 1955 Referring Provider:        Pulmonary Rehab Walk Test from 08/06/2015 in [place]   Referring Provider  Dr. Ottis Stain did not complete his rehab session. Just prior to exercise Joseph [name] cbg was 314. Per protocol he was not allowed to exercise. Upon futher questioning, Joseph [name] stated his am fasting sugar was 84. To compensate for the low cbg, he drank a nepro, ate 3 hotdogs with slaw, and a cup of fruit. Attempted to educate patient on importance of lowering his carb intake but patient become frustrated and angry. Did speak with patients wife to explain situation and why patient was not allowed to exercise. 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 Wife stated she will attempt to educate patient on proper diet prior to exercise.

## 2015-08-27 ENCOUNTER — Encounter: Payer: Self-pay | Admitting: Neurology

## 2015-08-27 ENCOUNTER — Encounter (HOSPITAL_COMMUNITY)
Admission: RE | Admit: 2015-08-27 | Discharge: 2015-08-27 | Disposition: A | Discharge status: Home / Self Care | Payer: 59 | Source: Ambulatory Visit | Attending: Cardiology | Admitting: Cardiology

## 2015-08-27 ENCOUNTER — Ambulatory Visit (INDEPENDENT_AMBULATORY_CARE_PROVIDER_SITE_OTHER): Payer: 59 | Admitting: Neurology

## 2015-08-27 VITALS — BP 129/71 | HR 84 | Ht 72.0 in | Wt 196.5 lb

## 2015-08-27 VITALS — Wt 196.0 lb

## 2015-08-27 DIAGNOSIS — R0600 Dyspnea, unspecified: Secondary | ICD-10-CM

## 2015-08-27 DIAGNOSIS — R569 Unspecified convulsions: Secondary | ICD-10-CM | POA: Diagnosis not present

## 2015-08-27 MED ORDER — LAMOTRIGINE 100 MG PO TABS: 100.0000 mg | ORAL_TABLET | Freq: Two times a day (BID) | ORAL | Status: AC

## 2015-08-27 MED ORDER — LAMOTRIGINE 25 MG PO TABS: ORAL_TABLET | ORAL | Status: DC

## 2015-08-27 NOTE — Progress Notes (Signed)
Daily Session Note  Patient Details  Name: Joseph [name] MRN: [id number] Date of Birth: 01-23-1956 Referring Provider:        Pulmonary Rehab Walk Test from 08/06/2015 in [place]   Referring Provider  Dr. Aundra Dubin      Encounter Date: 08/27/2015  Check In:     Session Check In - 08/27/15 1220    Check-In   [facility]   Staff Present Su Hilt, MS, ACSM RCEP, Exercise Physiologist;Kynlei Piontek Ysidro Evert, RN;Portia Rollene Rotunda, Therapist, sports, BSN;Ramon Dredge, RN, [facility]   Supervising physician immediately available to respond to emergencies [facility] immediately available   Physician(s) Dr. Marily Memos   Medication changes reported     No   Fall or balance concerns reported    No   Warm-up and Cool-down Performed as group-led instruction   Resistance Training Performed Yes   VAD Patient? No   Pain Assessment   Currently in Pain? No/denies   Multiple Pain Sites No      Capillary Blood Glucose: No results found for this or any previous visit (from the past 24 hour(s)).     POCT Glucose - 08/27/15 1226    POCT Blood Glucose   Pre-Exercise 135 mg/dL   Post-Exercise 179 mg/dL         Exercise Prescription Changes - 08/27/15 1200    Response to Exercise   Blood Pressure (Admit) 108/80 mmHg   Blood Pressure (Exercise) 100/70 mmHg   Blood Pressure (Exit) 100/76 mmHg   Heart Rate (Admit) 75 bpm   Heart Rate (Exercise) 89 bpm   Heart Rate (Exit) 76 bpm   Oxygen Saturation (Admit) 95 %   Oxygen Saturation (Exercise) 89 %   Oxygen Saturation (Exit) 99 %   Rating of Perceived Exertion (Exercise) 13   Perceived Dyspnea (Exercise) 1   Duration Progress to 45 minutes of aerobic exercise without signs/symptoms of physical distress   Intensity THRR unchanged   Progression   Progression Continue to progress workloads to maintain intensity without signs/symptoms of physical distress.   Resistance Training   Training Prescription Yes    Weight blue bands   Reps 10-12   Oxygen   Oxygen Continuous   Liters 3   Bike   Level 0.6   Minutes 15   NuStep   Level 3   Minutes 15   METs 2   Track   Laps 8   Minutes 15     Goals Met:  Exercise tolerated well No report of cardiac concerns or symptoms Strength training completed today  Goals Unmet:  Not Applicable  Comments: Service time is from 1030 to 1215    Dr. Rush Farmer is Medical Director for Pulmonary Rehab at [facility].

## 2015-08-27 NOTE — Progress Notes (Signed)
Chief Complaint  Patient presents with  . Seizures    He is here with his wife, [name].  Reports no further seizure activity.  He is still using Keppra but would like to discuss the possibility of weaning off his anticonvulsant therapy.      PATIENT: Joseph [name] DOB: [birth year]  Chief Complaint  Patient presents with  . Seizures    He is here with his wife, [name].  Reports no further seizure activity.  He is still using Keppra but would like to discuss the possibility of weaning off his anticonvulsant therapy.     HISTORICAL  Joseph [name] is a 60 years old right-handed male accompanied by his wife,, seen in refer by his primary care physician Dr. Shirline Frees for seizure January 24 2015   He has past medical history of hypertension, hyperlipidemia, insulin-dependent diabetes, previous heavy alcohol use, end stage renal disease, hemodialysis since March 08 2015, atrial fibrillation, on chronic Coumadin treatment.   He has developed skin rash since April 2016, was treated with antibiotics and steroid by dermatologist, developed C. difficile was treated with 2 weeks course of vancomycin, had hospital admission in July 18-22 2016 for acute on chronic congestive heart failure, severe pulmonary hypertension, suspected congestion hepatitis  He was brought back to hospital November 18 2014 by his wife, was found to be confused at home, not feeling well, upon admission, blood glucose level was 1006, diabetic ketoacidosis, Anion gap 17, CO2 21. He had a quick shift of his blood glucose level, August second, blood glucose decreased to 438, then dropped to 87 next morning.  He remained confused during his hospital stay,, CAT scan of the brain showed no significant abnormality, had to repeat MRI of the brain  I have personally reviewed MRI of the brain in August 3rd, 8th, 22nd,  Persistent abnormal T2/FLAIR signal intensity involving the mesial temporal lobes/hippocampi bilaterally, with question  of trace diffusion abnormality within the right hippocampus. While these findings appear overall improved relative to previous MRI from 11/21/2014, the T2/FLAIR signal abnormality appears slightly worsened relative to prior MRI from 11/26/2014 (although the diffusion changes are improved from that exam as well). 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 2. No other acute intracranial process. 3. Atrophy with chronic microvascular ischemic disease and remote left PCA territory infarct  He was diagnosed with limbic encephalitis, treated with 5 days of Solu-Medrol with improvement mental status, he was also started on acyclovir and underwent LP the following day which showed 1 WBC, RBC 38 in tube 1--> 0 in tube 4. HSV and VZV PCR's were negative. CSF culture was negative. EBV DNA was - CMV quant was low + at 135 copies. RPR and HIV negative. CSF Lyme, and paraneoplastic panel was negative.   He was seen by infectious disease, consider infectious etiology was less likely, he remained confused, eventually had plasma exchange from end of August to early September, he only showed mild improvement,  He is now back home, was started on Dilantin, 300 mg every night, ambulate with a walker, recent flareup of left knee gout, complains of severe left knee pain, use home oxygen, with diagnosis of severe pulmonary hypertension.  He is not back to his baseline, in July 2016, he was functioning independently, driving, take care of the household without much difficulty, now he is confused, described frequent dejavu phenomenon," like I has been there done that just 10 minutes before". There  was no clinical seizure  UPDATE Feb 26 2015: He is now taking keppra 500mg  bid, 500/750mg  on the dialysis day MWF, he does not have seizure like activities, he still has intermittent confusion, short term memory trouble, his diabetes is under good  control, he is off dilantin now, He complains of poor vision, has cataract will have surgery in December 2016,  He walks with a cane, continue to use oxygen, he also complains of short-term memory trouble  UPDATE May 9th 2017: His overall doing well, has no recurrent seizure, he is not taking Keppra 500 mg twice a day, extra to 50 mg after dialysis, I have personally reviewed MRI of the brain August 2016, evidence of bilateral mesial temporal signal change, abnormal EEG in August for 2016,  He also complains of moodiness, short-term memory trouble, overall has much improved. Today he also reported that previously while he was using cocaine, he had seizure-like activity,   continued even after he stopped using cocaine, he still has intermittent seizure-like activity with loss of consciousness uncontrollable body shaking   REVIEW OF SYSTEMS: Full 14 system review of systems performed and notable only for short term memory trouble, emotional    ALLERGIES: Allergies  Allergen Reactions  . Keflex [Cephalexin] Other (See Comments)    c-diff reaction  . Dilaudid [Hydromorphone] Nausea And Vomiting  . Tape Itching    Redness    HOME MEDICATIONS: Current Outpatient Prescriptions  Medication Sig Dispense Refill  . amiodarone (PACERONE) 400 MG tablet Take 1 tablet (400 mg total) by mouth daily. 30 tablet 0  . atorvastatin (LIPITOR) 40 MG tablet Take 1 tablet (40 mg total) by mouth at bedtime. 30 tablet 1  . calcitRIOL (ROCALTROL) 0.25 MCG capsule Take 7 capsules (1.75 mcg total) by mouth every Monday, Wednesday, and Friday with hemodialysis.    Marland Kitchen colchicine 0.6 MG tablet Take 1 tablet (0.6 mg total) by mouth daily. 30 tablet 1  . insulin glargine (LANTUS) 100 UNIT/ML injection Inject 0.17 mLs (17 Units total) into the skin at bedtime. 10 mL 11  . Metoprolol Tartrate 75 MG TABS Take 75 mg by mouth 2 (two) times daily. 60 tablet 0  . multivitamin (RENA-VIT) TABS tablet Take 1 tablet by mouth at  bedtime. 30 tablet 0  . naphazoline-pheniramine (NAPHCON-A) 0.025-0.3 % ophthalmic solution Place 2 drops into both eyes 4 (four) times daily as needed for irritation. 15 mL 0  . phenytoin (DILANTIN) 300 MG ER capsule Take 1 capsule (300 mg total) by mouth at bedtime. 30 capsule 1  . warfarin (COUMADIN) 3 MG tablet Take 1 tablet (3 mg total) by mouth daily at 6 PM. 30 tablet 1     PAST MEDICAL HISTORY: Past Medical History  Diagnosis Date  . Atrial fibrillation ([address])     a. 11/18/2011 s/p DCCV - 150J;  b. Anticoagulation w/ Apixaban;  c. 11/2011 back in afib->asymptomatic.  . H/O alcohol abuse     a. drinks heavily on the weekends.  . Hypertension   . Diabetes mellitus ([place])   . Heart murmur     a. 09/2011 Echo: EF 55-60%, Triv AI, Mild MR, mildly dil LA.  . Diabetic peripheral neuropathy ([place])   . CKD (chronic kidney disease), stage III   . CHF (congestive heart failure) ([place])   . Pneumonia   . Sleep apnea   . Seizure ([place])   . Chronic kidney disease requiring chronic dialysis ([place])     3 times weekly    PAST  SURGICAL HISTORY: Past Surgical History  Procedure Laterality Date  . Eye sx  11/11/2011    left eye  . Cardioversion  11/18/2011    Procedure: CARDIOVERSION;  Surgeon: Josue Hector, MD;  Location: [facility] ENDOSCOPY;  Service: Cardiovascular;  Laterality: N/A;  . Colonoscopy w/ biopsies and polypectomy    . Av fistula placement Left 02/13/2014    Procedure: INSERTION OF ARTERIOVENOUS (AV) GORE-TEX GRAFT ARM;  Surgeon: Angelia Mould, MD;  Location: [facility];  Service: Vascular;  Laterality: Left;  . Right heart catheterization N/A 10/11/2013    Procedure: RIGHT HEART CATH;  Surgeon: Larey Dresser, MD;  Location: [facility] CATH LAB;  Service: Cardiovascular;  Laterality: N/A;  . Cardiac catheterization N/A 04/25/2015    Procedure: Right Heart Cath;  Surgeon: Larey Dresser, MD;  Location: [place] CV LAB;  Service: Cardiovascular;  Laterality: N/A;    FAMILY HISTORY: Family  History  Problem Relation Age of Onset  . Heart disease Mother     before age 60  . Diabetes Father   . Diabetes Sister   . Peripheral vascular disease Sister     amputation  . Cancer Neg Hx   . Stroke Neg Hx     SOCIAL HISTORY:  Social History   Social History  . Marital Status: Married    Spouse Name: N/A  . Number of Children: 0  . Years of Education: 14   Occupational History  . Disabled    Social History Main Topics  . Smoking status: Former Smoker -- 0.01 packs/day for 5 years    Types: Cigarettes    Quit date: 04/20/1994  . Smokeless tobacco: Never Used  . Alcohol Use: No     Comment: Former use  . Drug Use: No  . Sexual Activity: Yes   Other Topics Concern  . Not on file   Social History Narrative   Lives at home with his wife.   Right-handed.   No caffeine use.     PHYSICAL EXAM   Filed Vitals:   08/27/15 1634  BP: 129/71  Pulse: 84  Height: 6' (1.829 m)  Weight: 196 lb 8 oz (89.132 kg)    Not recorded      Body mass index is 26.64 kg/(m^2).  PHYSICAL EXAMNIATION:  Gen: NAD, conversant, well nourised, obese, well groomed                     Cardiovascular: Regular rate rhythm, no peripheral edema, warm, nontender. Eyes: Conjunctivae clear without exudates or hemorrhage Neck: Supple, no carotid bruise. Pulmonary: Clear to auscultation bilaterally   NEUROLOGICAL EXAM:  MENTAL STATUS: Speech:    Speech is normal; fluent and spontaneous with normal comprehension.  Cognition:     Orientation to time, place and person     Normal recent and remote memory     Normal Attention span and concentration     Normal Language, naming, repeating,spontaneous speech     Fund of knowledge   CRANIAL NERVES: CN II: Visual fields are full to confrontation. Pupils are round equal and briskly reactive to light. CN III, IV, VI: extraocular movement are normal. No ptosis. CN V: Facial sensation is intact to pinprick in all 3 divisions bilaterally.  Corneal responses are intact.  CN VII: Face is symmetric with normal eye closure and smile. CN VIII: Hearing is normal to rubbing fingers CN IX, X: Palate elevates symmetrically. Phonation is normal. CN XI: Head turning and shoulder shrug are  intact CN XII: Tongue is midline with normal movements and no atrophy.  MOTOR: There is no pronator drift of out-stretched arms. Muscle bulk and tone are normal. Muscle strength is normal. Limited because of left knee pain  REFLEXES: Reflexes are 2+ and symmetric at the biceps, triceps, knees, and absent ankles. Plantar responses are flexor.  SENSORY: Length dependent decreased to light touch, pinprick, and vibration sensation to midshin level  COORDINATION: Rapid alternating movements and fine finger movements are intact. There is no dysmetria on finger-to-nose and heel-knee-shin.    GAIT/STANCE: Mildly unsteady, wide-based, cautious gait   ASSESSMENT AND PLAN  ANTWIONE OWUSU is a 60 y.o. male   History of Complex partial seizure, evidence of bilateral mesial temporal/hippocampus abnormality on MRI of the braiin August 2016 in the setting of significantly elevated glucose 1000  He is taking Coumadin for his atrial fibrillation  He complains of moodiness with Keppra, will switch him to Lamictal 100 mg twice a day   Diabetic peripheral neuropathy  Marcial Pacas, M.D. Ph.D.  [facility] [place], [place] [address] Phone: [phone] Fax:      [phone]

## 2015-08-27 NOTE — Patient Instructions (Signed)
You are now taking Keppra 500 mg twice a day  Start taking lamotrigine 25 mg twice a day for 1 week Second week lamotrigine 50 mg twice a day, Keppra, 0/500 mg Third week, lamotrigine 75 mg twice a day, Keppra 0/250 Fourth week, lamotrigine 100 mg twice a day, stopped taking Keppra

## 2015-08-29 ENCOUNTER — Telehealth: Payer: Self-pay | Admitting: *Deleted

## 2015-08-29 ENCOUNTER — Encounter (HOSPITAL_COMMUNITY)
Admission: RE | Admit: 2015-08-29 | Discharge: 2015-08-29 | Disposition: A | Discharge status: Home / Self Care | Payer: 59 | Source: Ambulatory Visit | Attending: Cardiology | Admitting: Cardiology

## 2015-08-29 VITALS — Wt 194.0 lb

## 2015-08-29 DIAGNOSIS — R0600 Dyspnea, unspecified: Secondary | ICD-10-CM | POA: Diagnosis not present

## 2015-08-29 NOTE — Telephone Encounter (Signed)
Spouse calls to inform us that Pt will be started on Lamotrigine 25mg s BID for 1 week, then dose will continue to increase until he gets to 100mg s BID. Also Keppra will decrease weekly. Discussed this with Elberta Leatherwood, Pharm D and no interaction with Keppra but Lamotrigine can, thus pt will come in as scheduled next week.

## 2015-08-29 NOTE — Progress Notes (Signed)
Daily Session Note  Patient Details  Name: Joseph [name] MRN: [id number] Date of Birth: 11/03/1955 Referring Provider:        Pulmonary Rehab Walk Test from 08/06/2015 in [facility]   Referring Provider  Dr. Aundra Dubin      Encounter Date: 08/29/2015  Check In:     Session Check In - 08/29/15 1103    Check-In   Location [facility]   Staff Present Rosebud Poles, RN, BSN;Molly diVincenzo, MS, ACSM RCEP, Exercise Physiologist;Annedrea Rosezella Florida, RN, MHA;Ziyan Hillmer Rollene Rotunda, RN, BSN   Supervising physician immediately available to respond to emergencies [facility] immediately available   Physician(s) Dr. Marily Memos   Medication changes reported     No   Fall or balance concerns reported    No   Warm-up and Cool-down Performed as group-led instruction   Resistance Training Performed Yes   VAD Patient? No   Pain Assessment   Currently in Pain? No/denies      Capillary Blood Glucose: No results found for this or any previous visit (from the past 24 hour(s)).     POCT Glucose - 08/29/15 1305    POCT Blood Glucose   Pre-Exercise 158 mg/dL   Post-Exercise 170 mg/dL         Exercise Prescription Changes - 08/29/15 1300    Response to Exercise   Blood Pressure (Admit) 106/68 mmHg   Blood Pressure (Exercise) 123/74 mmHg   Blood Pressure (Exit) 112/68 mmHg   Heart Rate (Admit) 91 bpm   Heart Rate (Exercise) 107 bpm   Heart Rate (Exit) 101 bpm   Oxygen Saturation (Admit) 100 %   Oxygen Saturation (Exercise) 91 %   Oxygen Saturation (Exit) 100 %   Rating of Perceived Exertion (Exercise) 13   Perceived Dyspnea (Exercise) 0   Duration Progress to 45 minutes of aerobic exercise without signs/symptoms of physical distress   Intensity THRR unchanged   Progression   Progression Continue to progress workloads to maintain intensity without signs/symptoms of physical distress.   Resistance Training   Training Prescription Yes   Weight blue  bands   Reps 10-12   Oxygen   Oxygen Continuous   Liters 3   Bike   Level 0.6   Minutes 15   Track   Laps 10   Minutes 15     Goals Met:  Exercise tolerated well Queuing for purse lip breathing No report of cardiac concerns or symptoms Strength training completed today  Goals Unmet:  Not Applicable  Comments: Service time is from 1030 to 1230   Dr. Rush Farmer is Medical Director for Pulmonary Rehab at [facility].

## 2015-09-01 ENCOUNTER — Other Ambulatory Visit (HOSPITAL_COMMUNITY): Payer: Self-pay | Admitting: Internal Medicine

## 2015-09-03 ENCOUNTER — Encounter (HOSPITAL_COMMUNITY)
Admission: RE | Admit: 2015-09-03 | Discharge: 2015-09-03 | Disposition: A | Discharge status: Home / Self Care | Payer: 59 | Source: Ambulatory Visit | Attending: Cardiology | Admitting: Cardiology

## 2015-09-03 DIAGNOSIS — R0600 Dyspnea, unspecified: Secondary | ICD-10-CM | POA: Diagnosis not present

## 2015-09-03 LAB — GLUCOSE, CAPILLARY: Glucose-Capillary: 159 mg/dL — ABNORMAL HIGH (ref 65–99)

## 2015-09-03 NOTE — Progress Notes (Signed)
Daily Session Note  Patient Details  Name: Joseph [name] MRN: [id number] Date of Birth: [birth year] Referring Provider:        Pulmonary Rehab Walk Test from 08/06/2015 in [place]   Referring Provider  Dr. Aundra Dubin      Encounter Date: 09/03/2015  Check In:     Session Check In - 09/03/15 1002    Check-In   Location [facility]   Staff Present Rosebud Poles, RN, BSN;Lisa Ysidro Evert, RN;Portia Rollene Rotunda, RN, BSN;Otie Headlee, MS, ACSM RCEP, Exercise Physiologist;Annedrea Rosezella [place], RN, [facility]   Supervising physician immediately available to respond to emergencies [facility] immediately available   Physician(s) Dr. Marily Memos   Medication changes reported     No   Fall or balance concerns reported    No   Warm-up and Cool-down Performed as group-led instruction   Resistance Training Performed Yes   VAD Patient? No   Pain Assessment   Currently in Pain? No/denies   Multiple Pain Sites No      Capillary Blood Glucose: No results found for this or any previous visit (from the past 24 hour(s)).      Exercise Prescription Changes - 09/03/15 1200    Response to Exercise   Blood Pressure (Admit) 120/74 mmHg   Blood Pressure (Exercise) 120/80 mmHg   Blood Pressure (Exit) 125/76 mmHg   Heart Rate (Admit) 72 bpm   Heart Rate (Exercise) 82 bpm   Heart Rate (Exit) 76 bpm   Oxygen Saturation (Admit) 100 %   Oxygen Saturation (Exercise) 88 %   Oxygen Saturation (Exit) 96 %   Rating of Perceived Exertion (Exercise) 13   Perceived Dyspnea (Exercise) 2   Duration Progress to 45 minutes of aerobic exercise without signs/symptoms of physical distress   Intensity THRR unchanged   Progression   Progression Continue to progress workloads to maintain intensity without signs/symptoms of physical distress.   Resistance Training   Training Prescription Yes   Weight blue bands   Reps 10-12   Oxygen   Oxygen Continuous   Liters 3   Bike   Level 0.6   Minutes 15   NuStep   Level 3   Minutes 15   METs 2.2   Track   Laps 8   Minutes 15     Goals Met:  Exercise tolerated well No report of cardiac concerns or symptoms Strength training completed today  Goals Unmet:  Not Applicable  Comments: Service time is from 10:30AM to 12:05PM    Dr. Rush Farmer is Medical Director for Pulmonary Rehab at [facility]

## 2015-09-05 ENCOUNTER — Encounter (HOSPITAL_COMMUNITY)
Admission: RE | Admit: 2015-09-05 | Discharge: 2015-09-05 | Disposition: A | Discharge status: Home / Self Care | Payer: 59 | Source: Ambulatory Visit | Attending: Cardiology | Admitting: Cardiology

## 2015-09-05 ENCOUNTER — Ambulatory Visit (INDEPENDENT_AMBULATORY_CARE_PROVIDER_SITE_OTHER): Payer: 59

## 2015-09-05 VITALS — Wt 194.9 lb

## 2015-09-05 DIAGNOSIS — I4821 Permanent atrial fibrillation: Secondary | ICD-10-CM

## 2015-09-05 DIAGNOSIS — I482 Chronic atrial fibrillation, unspecified: Secondary | ICD-10-CM

## 2015-09-05 DIAGNOSIS — R0600 Dyspnea, unspecified: Secondary | ICD-10-CM | POA: Diagnosis not present

## 2015-09-05 LAB — POCT INR: INR: 3.5

## 2015-09-05 NOTE — Progress Notes (Signed)
Pulmonary Individual Treatment Plan  Patient Details  Name: Joseph [name] MRN: [id number] Date of Birth: 1955-12-21 Referring Provider:        Pulmonary Rehab Walk Test from 08/06/2015 in [facility]   Referring Provider  Dr. Aundra Dubin      Initial Encounter Date:       Pulmonary Rehab Walk Test from 08/06/2015 in [facility]   Date  08/06/15   Referring Provider  Dr. Aundra Dubin      Visit Diagnosis: Dyspnea  Patient's Home Medications on Admission:   Current outpatient prescriptions:  .  acetaminophen (TYLENOL) 500 MG tablet, Take 1,000 mg by mouth every 6 (six) hours as needed (pain). Reported on 05/09/2015, Disp: , Rfl:  .  amiodarone (PACERONE) 200 MG tablet, Take 200 mg by mouth daily., Disp: , Rfl:  .  amiodarone (PACERONE) 200 MG tablet, TAKE 1 TABLET (200 MG TOTAL) BY MOUTH DAILY., Disp: 30 tablet, Rfl: 6 .  atorvastatin (LIPITOR) 40 MG tablet, Take 1 tablet (40 mg total) by mouth at bedtime., Disp: 30 tablet, Rfl: 1 .  B Complex-C-Folic Acid (VOL-CARE RX) 1 MG TABS, Take 1 tablet by mouth at bedtime., Disp: , Rfl: 11 .  B-D ULTRAFINE III SHORT PEN 31G X 8 MM MISC, USE AS DIRECTED ONCE A DAY, Disp: , Rfl: 11 .  budesonide-formoterol (SYMBICORT) 80-4.5 MCG/ACT inhaler, Inhale 2 puffs into the lungs 2 (two) times daily., Disp: 1 Inhaler, Rfl: 5 .  calcitRIOL (ROCALTROL) 0.25 MCG capsule, Take 7 capsules (1.75 mcg total) by mouth every Monday, Wednesday, and Friday with hemodialysis., Disp: , Rfl:  .  colchicine 0.6 MG tablet, Take 0.6 mg by mouth 2 (two) times daily., Disp: , Rfl:  .  ethyl chloride spray, USE AS DIRECTED FOR DIALYSIS, Disp: , Rfl: 3 .  insulin glargine (LANTUS) 100 unit/mL SOPN, Inject 17-20 Units into the skin daily before supper. , Disp: , Rfl:  .  ketorolac (ACULAR) 0.5 % ophthalmic solution, Place 1 drop into the right eye See admin instructions. Instill 1 drop into right eye 4 times daily starting 72 hours  prior to surgery, Disp: , Rfl: 4 .  lamoTRIgine (LAMICTAL) 100 MG tablet, Take 1 tablet (100 mg total) by mouth 2 (two) times daily., Disp: 60 tablet, Rfl: 11 .  lamoTRIgine (LAMICTAL) 25 MG tablet, One po bid xone week, then 2 tabs po bid xone week, then 3 tabs po bid xone week, then 4 tabs po bid, Disp: 100 tablet, Rfl: 0 .  levETIRAcetam (KEPPRA) 500 MG tablet, Take 500 mg by mouth as directed. 500 mg twice a day. Mon, Wed, Fri on dialysis days an additional 250 mg with supper., Disp: , Rfl:  .  loperamide (IMODIUM) 1 MG/5ML solution, Take 6 mg by mouth 2 (two) times daily as needed for diarrhea or loose stools., Disp: , Rfl:  .  metoprolol tartrate (LOPRESSOR) 25 MG tablet, Take 25 mg by mouth as directed. Take 1 tablet (25 mg) twice daily on Monday, Wednesday, and Friday (Dialysis days) and take 2 tablets (50 mg) twice daily on Sunday, Tuesday, Thursday, Saturday, Disp: , Rfl:  .  sevelamer carbonate (RENVELA) 800 MG tablet, Take 800-2,400 mg by mouth See admin instructions. Take 3 tablets (2400 mg) by mouth with meals and 1 tablet (800 mg) with snacks - total of 8 tablets daily, Disp: , Rfl:  .  warfarin (COUMADIN) 3 MG tablet, Take as directed by coumadin clinic, Disp:  65 tablet, Rfl: 3  Past Medical History: Past Medical History  Diagnosis Date  . Atrial fibrillation ([place])     a. 11/18/2011 s/p DCCV - 150J;  b. Anticoagulation w/ Apixaban;  c. 11/2011 back in afib->asymptomatic.  . H/O alcohol abuse     a. drinks heavily on the weekends.  . Hypertension   . Diabetes mellitus ([place])   . Heart murmur     a. 09/2011 Echo: EF 55-60%, Triv AI, Mild MR, mildly dil LA.  . Diabetic peripheral neuropathy ([place])   . CKD (chronic kidney disease), stage III   . CHF (congestive heart failure) ([place])   . Pneumonia   . Sleep apnea   . Seizure ([place])   . 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 Former Smoker -- 0.01  packs/day for 5 years  . Types: Cigarettes  . Quit date: 04/20/1994  Smokeless tobacco  . Never Used    Labs: Recent Review Flowsheet Data    Labs for ITP Cardiac and Pulmonary Rehab Latest Ref Rng 12/19/2014 12/20/2014 12/21/2014 12/22/2014 04/25/2015   HCO3 20.0 - 24.0 mEq/L - - - - 32.6(H)   TCO2 0 - 100 mmol/L _0 34   O2SAT - - - - - 67.0      Capillary Blood Glucose: Lab Results  Component Value Date   GLUCAP 159* 09/03/2015   GLUCAP 314* 08/22/2015   GLUCAP 162* 08/13/2015   GLUCAP 216* 08/13/2015   GLUCAP 118* 08/02/2015       POCT Glucose      08/13/15 1307 08/20/15 1237 08/22/15 1115 08/27/15 1226 08/29/15 1305   POCT Blood Glucose   Pre-Exercise 216 mg/dL 216 mg/dL 314 mg/dL 135 mg/dL 158 mg/dL   Post-Exercise 162 mg/dL 238 mg/dL  179 mg/dL 170 mg/dL     09/03/15 1206           POCT Blood Glucose   Pre-Exercise 145 mg/dL       Post-Exercise 159 mg/dL          ADL UCSD:     Pulmonary Assessment Scores      08/06/15 1213       ADL UCSD   SOB Score total 99        Pulmonary Function Assessment:     Pulmonary Function Assessment - 08/02/15 1050    Breath   Bilateral Breath Sounds Clear   Shortness of Breath Yes      Exercise Target Goals:    Exercise Program Goal: Individual exercise prescription set with THRR, safety & activity barriers. Participant demonstrates ability to understand and report RPE using BORG scale, to self-measure pulse accurately, and to acknowledge the importance of the exercise prescription.  Exercise Prescription Goal: Starting with aerobic activity 30 plus minutes a day, 3 days per week for initial exercise prescription. Provide home exercise prescription and guidelines that participant acknowledges understanding prior to discharge.  Activity Barriers & Risk Stratification:     Activity Barriers & Cardiac Risk Stratification - 08/02/15 1048    Activity Barriers & Cardiac Risk Stratification   Activity  Barriers Deconditioning;Shortness of Breath      6 Minute Walk:     6 Minute Walk      08/06/15 1210       6 Minute Walk   Phase Initial     Distance 950 feet     Walk Time  6 minutes     # of Rest Breaks 0     MPH 1.8     METS 3.05     RPE 11     Perceived Dyspnea  0     VO2 Peak 10.69     Symptoms No     Resting HR 92 bpm     Resting BP 126/70 mmHg     Max Ex. HR 98 bpm     Max Ex. BP 148/74 mmHg     Interval HR   Baseline HR 92     2 Minute HR 85     4 Minute HR 90     6 Minute HR 87     Interval Heart Rate? Yes     Interval Oxygen   Baseline Oxygen Saturation % 93 %     Baseline Liters of Oxygen 2 L     1 Minute Oxygen Saturation % 95 %     1 Minute Liters of Oxygen 2 L     2 Minute Oxygen Saturation % 94 %     2 Minute Liters of Oxygen 2 L     3 Minute Oxygen Saturation % 91 %     3 Minute Liters of Oxygen 2 L     4 Minute Oxygen Saturation % 90 %     4 Minute Liters of Oxygen 2 L     5 Minute Oxygen Saturation % 88 %     5 Minute Liters of Oxygen 2 L     6 Minute Oxygen Saturation % 87 %     6 Minute Liters of Oxygen 2 L     2 Minute Post Oxygen Saturation % 86 %     2 Minute Post Liters of Oxygen 2 L        Initial Exercise Prescription:     Initial Exercise Prescription - 08/06/15 1600    Date of Initial Exercise RX and Referring Provider   Date 08/06/15   Referring Provider Dr. Aundra Dubin   Oxygen   Oxygen Continuous   Liters 2   Bike   Level 0.6   Minutes 15   NuStep   Level 2   Minutes 15   METs 1.5   Track   Laps 5   Minutes 15   Prescription Details   Frequency (times per week) 2   Duration Progress to 45 minutes of aerobic exercise without signs/symptoms of physical distress   Intensity   THRR 40-80% of Max Heartrate 64-128   Ratings of Perceived Exertion 11-13   Perceived Dyspnea 0-4   Progression   Progression Continue progressive overload as per policy without signs/symptoms or physical distress.   Resistance Training    Training Prescription Yes   Weight blue bands   Reps 10-12      Perform Capillary Blood Glucose checks as needed.  Exercise Prescription Changes:     Exercise Prescription Changes      08/13/15 1300 08/20/15 1200 08/27/15 1200 08/29/15 1300 09/03/15 1200   Exercise Review   Progression  Yes      Response to Exercise   Blood Pressure (Admit) 112/60 mmHg 102/60 mmHg 108/80 mmHg 106/68 mmHg 120/74 mmHg   Blood Pressure (Exercise) 158/84 mmHg 116/60 mmHg 100/70 mmHg 123/74 mmHg 120/80 mmHg   Blood Pressure (Exit) 104/60 mmHg 128/70 mmHg 100/76 mmHg 112/68 mmHg 125/76 mmHg   Heart Rate (Admit) 82 bpm 79 bpm 75 bpm 91 bpm 72 bpm   Heart Rate (  Exercise) 90 bpm 114 bpm 89 bpm 107 bpm 82 bpm   Heart Rate (Exit) 78 bpm 86 bpm 76 bpm 101 bpm 76 bpm   Oxygen Saturation (Admit) 93 % 99 % 95 % 100 % 100 %   Oxygen Saturation (Exercise) 84 % 86 % 89 % 91 % 88 %   Oxygen Saturation (Exit) 100 % 98 % 99 % 100 % 96 %   Rating of Perceived Exertion (Exercise) _0 Perceived Dyspnea (Exercise) 0 0 1 0 2   Duration Progress to 45 minutes of aerobic exercise without signs/symptoms of physical distress Progress to 45 minutes of aerobic exercise without signs/symptoms of physical distress Progress to 45 minutes of aerobic exercise without signs/symptoms of physical distress Progress to 45 minutes of aerobic exercise without signs/symptoms of physical distress Progress to 45 minutes of aerobic exercise without signs/symptoms of physical distress   Intensity --  40-80% HRR THRR unchanged THRR unchanged THRR unchanged THRR unchanged   Progression   Progression  Continue progressive overload as per policy without signs/symptoms or physical distress. Continue to progress workloads to maintain intensity without signs/symptoms of physical distress. Continue to progress workloads to maintain intensity without signs/symptoms of physical distress. Continue to progress workloads to maintain intensity without  signs/symptoms of physical distress.   Resistance Training   Training Prescription _1    Weight _2    Reps 10-12 10-12 10-12 10-12 10-12   Oxygen   Oxygen _3    Liters increased to _4 Bike   Level 0.6 0.6 0.6 0.6 0.6   Minutes _5 NuStep   Level _6 Minutes _7 METs 1.4 2.1 2  2.2   Track   Laps _8 Minutes _9 09/05/15 1400           Response to Exercise   Blood Pressure (Admit) 125/83 mmHg       Blood Pressure (Exercise) 110/79 mmHg       Blood Pressure (Exit) 139/75 mmHg       Heart Rate (Admit) 75 bpm       Heart Rate (Exercise) 88 bpm       Heart Rate (Exit) 87 bpm       Oxygen Saturation (Admit) 100 %       Oxygen Saturation (Exercise) 93 %       Oxygen Saturation (Exit) 99 %       Rating of Perceived Exertion (Exercise) 11       Perceived Dyspnea (Exercise) 1       Duration Progress to 45 minutes of aerobic exercise without signs/symptoms of physical distress       Intensity THRR unchanged       Progression   Progression Continue to progress workloads to maintain intensity without signs/symptoms of physical distress.       Resistance Training   Training Prescription No       Weight blue bands       Reps 10-12       Oxygen   Oxygen Continuous       Liters 3       Bike   Level 0.6       Minutes 15  NuStep   Level 3       Minutes 15       METs 1.9          Exercise Comments:     Exercise Comments      09/05/15 0850           Exercise Comments Patient is cont. to progress exercise intensity. Patient is currently walking 10 laps in 15 minutes. Will continue to monitor and progress as appropriate.          Discharge Exercise Prescription (Final Exercise Prescription Changes):     Exercise Prescription Changes - 09/05/15 1400    Response to Exercise   Blood  Pressure (Admit) 125/83 mmHg   Blood Pressure (Exercise) 110/79 mmHg   Blood Pressure (Exit) 139/75 mmHg   Heart Rate (Admit) 75 bpm   Heart Rate (Exercise) 88 bpm   Heart Rate (Exit) 87 bpm   Oxygen Saturation (Admit) 100 %   Oxygen Saturation (Exercise) 93 %   Oxygen Saturation (Exit) 99 %   Rating of Perceived Exertion (Exercise) 11   Perceived Dyspnea (Exercise) 1   Duration Progress to 45 minutes of aerobic exercise without signs/symptoms of physical distress   Intensity THRR unchanged   Progression   Progression Continue to progress workloads to maintain intensity without signs/symptoms of physical distress.   Resistance Training   Training Prescription No   Weight blue bands   Reps 10-12   Oxygen   Oxygen Continuous   Liters 3   Bike   Level 0.6   Minutes 15   NuStep   Level 3   Minutes 15   METs 1.9       Nutrition:  Target Goals: Understanding of nutrition guidelines, daily intake of sodium <1562m, cholesterol <2038m calories 30% from fat and 7% or less from saturated fats, daily to have 5 or more servings of fruits and vegetables.  Biometrics:     Pre Biometrics - 08/06/15 1220    Pre Biometrics   Grip Strength 40 kg       Nutrition Therapy Plan and Nutrition Goals:     Nutrition Therapy & Goals - 09/05/15 1327    Nutrition Therapy   Diet Diabetic, Renal   Drug/Food Interactions Coumadin/Vit K   Personal Nutrition Goals   Personal Goal #1 CBG's in the normal range or as close to normal as is safely possible.   Intervention Plan   Intervention Prescribe, educate and counsel regarding individualized specific dietary modifications aiming towards targeted core components such as weight, hypertension, lipid management, diabetes, heart failure and other comorbidities.   Expected Outcomes Short Term Goal: Understand basic principles of dietary content, such as calories, fat, sodium, cholesterol and nutrients.;Long Term Goal: Adherence to prescribed  nutrition plan.      Nutrition Discharge: Rate Your Plate Scores:     Nutrition Assessments - 09/05/15 1324    Rate Your Plate Scores   Pre Score 50      Psychosocial: Target Goals: Acknowledge presence or absence of depression, maximize coping skills, provide positive support system. Participant is able to verbalize types and ability to use techniques and skills needed for reducing stress and depression.  Initial Review & Psychosocial Screening:     Initial Psych Review & Screening - 08/02/15 11WalkerYes   Barriers   Psychosocial barriers to participate in program There are no identifiable barriers or psychosocial needs.   Screening Interventions  Interventions Encouraged to exercise      Quality of Life Scores:     Quality of Life - 08/06/15 1213    Quality of Life Scores   Health/Function Pre 26 %   Socioeconomic Pre 23.33 %   Psych/Spiritual Pre 29 %   Family Pre 20 %   GLOBAL Pre 25.27 %      PHQ-9:     Recent Review Flowsheet Data    Depression screen [facility] 2/9 08/02/2015 05/17/2014   Decreased Interest 0 0   Down, Depressed, Hopeless 0 0   PHQ - 2 Score 0 0      Psychosocial Evaluation and Intervention:   Psychosocial Re-Evaluation:     Psychosocial Re-Evaluation      09/03/15 1453           Psychosocial Re-Evaluation   Interventions Encouraged to attend Pulmonary Rehabilitation for the exercise       Comments patient continues to suffer from short term memory loss. He initially had trouble adhering to pulmonary rehab diabetic protocol. He has done much better with checking his bloodsugars prior to and post exercise with prompting and with less resistance.          Education: Education Goals: Education classes will be provided on a weekly basis, covering required topics. Participant will state understanding/return demonstration of topics presented.  Learning Barriers/Preferences:     Learning  Barriers/Preferences - 08/02/15 1049    Learning Barriers/Preferences   Learning Barriers Sight  memory   Learning Preferences Individual Instruction;Written Material;Skilled Demonstration;Verbal Instruction      Education Topics: Risk Factor Reduction:  -Group instruction that is supported by a PowerPoint presentation. Instructor discusses the definition of a risk factor, different risk factors for pulmonary disease, and how the heart and lungs work together.     Nutrition for Pulmonary Patient:  -Group instruction provided by PowerPoint slides, verbal discussion, and written materials to support subject matter. The instructor gives an explanation and review of healthy diet recommendations, which includes a discussion on weight management, recommendations for fruit and vegetable consumption, as well as protein, fluid, caffeine, fiber, sodium, sugar, and alcohol. Tips for eating when patients are short of breath are discussed.   Pursed Lip Breathing:  -Group instruction that is supported by demonstration and informational handouts. Instructor discusses the benefits of pursed lip and diaphragmatic breathing and detailed demonstration on how to preform both.     Oxygen Safety:  -Group instruction provided by PowerPoint, verbal discussion, and written material to support subject matter. There is an overview of "What is Oxygen" and "Why do we need it".  Instructor also reviews how to create a safe environment for oxygen use, the importance of using oxygen as prescribed, and the risks of noncompliance. There is a brief discussion on traveling with oxygen and resources the patient may utilize.   Oxygen Equipment:  -Group instruction provided by [facility] Staff utilizing handouts, written materials, and equipment demonstrations.   Signs and Symptoms:  -Group instruction provided by written material and verbal discussion to support subject matter. Warning signs and symptoms of infection,  stroke, and heart attack are reviewed and when to call the physician/911 reinforced. Tips for preventing the spread of infection discussed.   Advanced Directives:  -Group instruction provided by verbal instruction and written material to support subject matter. Instructor reviews Advanced Directive laws and proper instruction for filling out document.   Pulmonary Video:  -Group video education that reviews the importance of medication and oxygen compliance, exercise,  good nutrition, pulmonary hygiene, and pursed lip and diaphragmatic breathing for the pulmonary patient.   Exercise for the Pulmonary Patient:  -Group instruction that is supported by a PowerPoint presentation. Instructor discusses benefits of exercise, core components of exercise, frequency, duration, and intensity of an exercise routine, importance of utilizing pulse oximetry during exercise, safety while exercising, and options of places to exercise outside of rehab.            PULMONARY REHAB OTHER RESPIRATORY from 09/05/2015 in [place]   Date  08/22/15   Educator  EP   Instruction Review Code  2- meets goals/outcomes      Pulmonary Medications:  -Verbally interactive group education provided by instructor with focus on inhaled medications and proper administration.   Anatomy and Physiology of the Respiratory System and Intimacy:  -Group instruction provided by PowerPoint, verbal discussion, and written material to support subject matter. Instructor reviews respiratory cycle and anatomical components of the respiratory system and their functions. Instructor also reviews differences in obstructive and restrictive respiratory diseases with examples of each. Intimacy, Sex, and Sexuality differences are reviewed with a discussion on how relationships can change when diagnosed with pulmonary disease. Common sexual concerns are reviewed.      PULMONARY REHAB OTHER RESPIRATORY from 09/05/2015 in  [place]   Date  08/29/15   Educator  -- [RN]   Instruction Review Code  2- meets goals/outcomes      Knowledge Questionnaire Score:     Knowledge Questionnaire Score - 08/27/15 1541    Knowledge Questionnaire Score   Pre Score --  reviewed with patient 08/27/15      Core Components/Risk Factors/Patient Goals at Admission:     Personal Goals and Risk Factors at Admission - 08/02/15 1116    Core Components/Risk Factors/Patient Goals on Admission    Weight Management Yes   Intervention Weight Management: Develop a combined nutrition and exercise program designed to reach desired caloric intake, while maintaining appropriate intake of nutrient and fiber, sodium and fats, and appropriate energy expenditure required for the weight goal.;Weight Management: Provide education and appropriate resources to help participant work on and attain dietary goals.;Weight Management/Obesity: Establish reasonable short term and long term weight goals.   Expected Outcomes Weight Maintenance: Understanding of the daily nutrition guidelines, which includes 25-35% calories from fat, 7% or less cal from saturated fats, less than 294m cholesterol, less than 1.5gm of sodium, & 5 or more servings of fruits and vegetables daily;Understanding recommendations for meals to include 15-35% energy as protein, 25-35% energy from fat, 35-60% energy from carbohydrates, less than 2071mof dietary cholesterol, 20-35 gm of total fiber daily;Understanding of distribution of calorie intake throughout the day with the consumption of 4-5 meals/snacks;Short Term: Continue to assess and modify interventions until short term weight is achieved;Long Term: Adherence to nutrition and physical activity/exercise program aimed toward attainment of established weight goal   Increase Strength and Stamina Yes   Intervention Provide advice, education, support and counseling about physical activity/exercise  needs.;Develop an individualized exercise prescription for aerobic and resistive training based on initial evaluation findings, risk stratification, comorbidities and participant's personal goals.   Expected Outcomes Achievement of increased cardiorespiratory fitness and enhanced flexibility, muscular endurance and strength shown through measurements of functional capacity and personal statement of participant.   Improve shortness of breath with ADL's Yes   Intervention Provide education, individualized exercise plan and daily activity instruction to help decrease symptoms of SOB with  activities of daily living.   Expected Outcomes Short Term: Achieves a reduction of symptoms when performing activities of daily living.   Develop more efficient breathing techniques such as purse lipped breathing and diaphragmatic breathing; and practicing self-pacing with activity Yes   Intervention Provide education, demonstration and support about specific breathing techniuqes utilized for more efficient breathing. Include techniques such as pursed lipped breathing, diaphragmatic breathing and self-pacing activity.   Expected Outcomes Short Term: Participant will be able to demonstrate and use breathing techniques as needed throughout daily activities.   Diabetes Yes   Intervention Provide education about signs/symptoms and action to take for hypo/hyperglycemia.;Provide education about proper nutrition, including hydration, and aerobic/resistive exercise prescription along with prescribed medications to achieve blood glucose in normal ranges: Fasting glucose 65-99 mg/dL   Expected Outcomes Short Term: Participant verbalizes understanding of the signs/symptoms and immediate care of hyper/hypoglycemia, proper foot care and importance of medication, aerobic/resistive exercise and nutrition plan for blood glucose control.;Long Term: Attainment of HbA1C < 7%.   Hypertension Yes   Intervention Provide education on lifestyle  modifcations including regular physical activity/exercise, weight management, moderate sodium restriction and increased consumption of fresh fruit, vegetables, and low fat dairy, alcohol moderation, and smoking cessation.;Monitor prescription use compliance.   Expected Outcomes Short Term: Continued assessment and intervention until BP is < 140/88m HG in hypertensive participants. < 130/821mHG in hypertensive participants with diabetes, heart failure or chronic kidney disease.;Long Term: Maintenance of blood pressure at goal levels.   Personal Goal Other Yes   Personal Goal play golf again, increase stamina and strenght, maintain oxygenation on RA so that suplemental O2 can be discontinued   Intervention increase workloads on the exercise equiptment as tolerated inorder to increase stamina and strength, encourage pursed lip breathing, do 6 min walk test off O2 to determine continuous need for O2   Expected Outcomes patient will be able to play golf this spring, will stated his stamina and strength have increased      Core Components/Risk Factors/Patient Goals Review:      Goals and Risk Factor Review      09/05/15 0849 09/05/15 1325         Core Components/Risk Factors/Patient Goals Review   Personal Goals Review Improve shortness of breath with ADL's;Increase knowledge of respiratory medications and ability to use respiratory devices properly.;Hypertension Diabetes      Review see "comments" section on ITP see 09/05/15 RD note      Expected Outcomes see Admission expected outcomes see Admission expected outcomes         Core Components/Risk Factors/Patient Goals at Discharge (Final Review):      Goals and Risk Factor Review - 09/05/15 1325    Core Components/Risk Factors/Patient Goals Review   Personal Goals Review Diabetes   Review see 09/05/15 RD note   Expected Outcomes see Admission expected outcomes      ITP Comments:   Comments: ITP REVIEW Pt is making expected progress  toward personal goals after completing 7 sessions. Recommend continued exercise, life style modification, education, and utilization of breathing techniques to increase stamina and strength and decrease shortness of breath with exertion.

## 2015-09-05 NOTE — Progress Notes (Addendum)
Joseph [name] 60 y.o. male Nutrition Note Spoke with pt. Pt is overweight and wants to maintain his wt. Pt making healthy food choices given his dietary restrictions (Diabetic, Renal). Pt's Rate Your Plate results reviewed with pt. Pt tries to avoid many salty foods; uses canned soup occasionally, which he dilutes with water. Pt does not add salt to food. The role of sodium in lung disease reviewed with pt. Pt is diabetic.  No recent A1c noted. Pt checks CBG's 2 times a day.  Fasting CBG's reportedly 70's - 90's mg/dL. Pt fasting CBG 273 mg/dL per pt. Pt CBG's noted to increase with exercise. Pt states he typically eats eggs, lean breakfast meat and a piece of toast for breakfast. Pt reports he only ate a fruit cup this morning "because I didn't want my sugar to be too high." Pt expressed understanding of the information reviewed. Pt plans on sharing dietary information with his wife "because she controls my diet."    Nutrition Diagnosis ? Food-and nutrition-related knowledge deficit related to lack of exposure to information as related to diagnosis of pulmonary disease ?  Nutrition Rx/Est. Daily Nutrition Needs for: ? wt maintenance 2450-2650 Kcal  110-135 gm protein   1500 mg or less sodium Nutrition Intervention ? Pt's individual nutrition plan and goals reviewed with pt. ? Benefits of adopting healthy eating habits discussed when pt's Rate Your Plate reviewed. ? Pt to attend the Nutrition and Lung Disease class ? Continual client-centered nutrition education by RD, as part of interdisciplinary care. Goal(s) 1. CBG's in the normal range or as close to normal as is safely possible. Monitor and Evaluate progress toward nutrition goal with team.   Joseph [name], M.Ed, RD, LDN, CDE 09/05/2015 12:08 PM

## 2015-09-05 NOTE — Progress Notes (Signed)
Daily Session Note  Patient Details  Name: Joseph [name] MRN: [id number] Date of Birth: 1955/10/22 Referring Provider:        Pulmonary Rehab Walk Test from 08/06/2015 in [place]   Referring Provider  Dr. Aundra Dubin      Encounter Date: 09/05/2015  Check In:     Session Check In - 09/05/15 1019    Check-In   Location [facility]   Staff Present Trish Fountain, RN, Maxcine Ham, RN, BSN;Nathanial Arrighi, RN;Molly diVincenzo, MS, ACSM RCEP, Exercise Physiologist   Supervising physician immediately available to respond to emergencies [facility] immediately available   Physician(s) Dr. Waldron Labs   Medication changes reported     No   Fall or balance concerns reported    No   Warm-up and Cool-down Performed as group-led instruction   Resistance Training Performed Yes   VAD Patient? No   Pain Assessment   Currently in Pain? No/denies   Multiple Pain Sites No      Capillary Blood Glucose: No results found for this or any previous visit (from the past 24 hour(s)).      Exercise Prescription Changes - 09/05/15 1400    Response to Exercise   Blood Pressure (Admit) 125/83 mmHg   Blood Pressure (Exercise) 110/79 mmHg   Blood Pressure (Exit) 139/75 mmHg   Heart Rate (Admit) 75 bpm   Heart Rate (Exercise) 88 bpm   Heart Rate (Exit) 87 bpm   Oxygen Saturation (Admit) 100 %   Oxygen Saturation (Exercise) 93 %   Oxygen Saturation (Exit) 99 %   Rating of Perceived Exertion (Exercise) 11   Perceived Dyspnea (Exercise) 1   Duration Progress to 45 minutes of aerobic exercise without signs/symptoms of physical distress   Intensity THRR unchanged   Progression   Progression Continue to progress workloads to maintain intensity without signs/symptoms of physical distress.   Resistance Training   Training Prescription No   Weight blue bands   Reps 10-12   Oxygen   Oxygen Continuous   Liters 3   Bike   Level 0.6   Minutes 15   NuStep   Level 3   Minutes 15   METs 1.9     Goals Met:  Exercise tolerated well No report of cardiac concerns or symptoms Strength training completed today  Goals Unmet:  Not Applicable  Comments:  Service time is from 1030 to 1220    Dr. Rush Farmer is Medical Director for Pulmonary Rehab at [facility].

## 2015-09-10 ENCOUNTER — Encounter (HOSPITAL_COMMUNITY)
Admission: RE | Admit: 2015-09-10 | Discharge: 2015-09-10 | Disposition: A | Discharge status: Home / Self Care | Payer: 59 | Source: Ambulatory Visit | Attending: Cardiology | Admitting: Cardiology

## 2015-09-10 ENCOUNTER — Telehealth: Payer: Self-pay | Admitting: Pulmonary Disease

## 2015-09-10 VITALS — Wt 195.5 lb

## 2015-09-10 DIAGNOSIS — R0600 Dyspnea, unspecified: Secondary | ICD-10-CM | POA: Diagnosis not present

## 2015-09-10 NOTE — Telephone Encounter (Signed)
Spoke with pt's wife. Pt is needing samples of Symbicort. Samples have been left at the front desk. Nothing further was needed.

## 2015-09-10 NOTE — Progress Notes (Signed)
Daily Session Note  Patient Details  Name: Joseph [name] MRN: [id number] Date of Birth: 22-Mar-1956 Referring Provider:        Pulmonary Rehab Walk Test from 08/06/2015 in [place]   Referring Provider  Dr. Aundra Dubin      Encounter Date: 09/10/2015  Check In:     Session Check In - 09/10/15 1217    Check-In   [facility]   Staff Present Su Hilt, MS, ACSM RCEP, Exercise Physiologist;Lisa Ysidro Evert, RN;Portia Rollene Rotunda, RN, BSN   Supervising physician immediately available to respond to emergencies [facility] immediately available   Physician(s) Dr. Marily Memos   Medication changes reported     No   Fall or balance concerns reported    No   Warm-up and Cool-down Performed as group-led instruction   Resistance Training Performed Yes   VAD Patient? No   Pain Assessment   Currently in Pain? No/denies   Multiple Pain Sites No      Capillary Blood Glucose: No results found for this or any previous visit (from the past 24 hour(s)).     POCT Glucose - 09/10/15 1219    POCT Blood Glucose   Pre-Exercise 207 mg/dL   Post-Exercise 181 mg/dL         Exercise Prescription Changes - 09/10/15 1200    Response to Exercise   Blood Pressure (Admit) 118/56 mmHg   Blood Pressure (Exercise) 128/85 mmHg   Blood Pressure (Exit) 132/84 mmHg   Heart Rate (Admit) 70 bpm   Heart Rate (Exercise) 85 bpm   Heart Rate (Exit) 73 bpm   Oxygen Saturation (Admit) 95 %   Oxygen Saturation (Exercise) 88 %   Oxygen Saturation (Exit) 99 %   Rating of Perceived Exertion (Exercise) 11   Perceived Dyspnea (Exercise) 1   Duration Progress to 45 minutes of aerobic exercise without signs/symptoms of physical distress   Intensity THRR unchanged   Progression   Progression Continue to progress workloads to maintain intensity without signs/symptoms of physical distress.   Resistance Training   Training Prescription No   Weight blue bands   Reps  10-12   Oxygen   Oxygen Continuous   Liters 3   Bike   Level 0.6   Minutes 15   NuStep   Level 3   Minutes 15   METs 2.4   Track   Laps 9     Goals Met:  Exercise tolerated well No report of cardiac concerns or symptoms Strength training completed today  Goals Unmet:  Not Applicable  Comments: Service time is from 10:30am to 12:05pm    Dr. Rush Farmer is Medical Director for Pulmonary Rehab at [facility].

## 2015-09-12 ENCOUNTER — Encounter (HOSPITAL_COMMUNITY)
Admission: RE | Admit: 2015-09-12 | Discharge: 2015-09-12 | Disposition: A | Discharge status: Home / Self Care | Payer: 59 | Source: Ambulatory Visit | Attending: Cardiology | Admitting: Cardiology

## 2015-09-12 VITALS — Wt 195.5 lb

## 2015-09-12 DIAGNOSIS — R0600 Dyspnea, unspecified: Secondary | ICD-10-CM

## 2015-09-12 NOTE — Progress Notes (Signed)
Daily Session Note  Patient Details  Name: Joseph [name] MRN: [id number] Date of Birth: 06-Feb-1956 Referring Provider:        Pulmonary Rehab Walk Test from 08/06/2015 in [place]   Referring Provider  Dr. Aundra Dubin      Encounter Date: 09/12/2015  Check In:     Session Check In - 09/12/15 1007    Check-In   [facility]   Staff Present Su Hilt, MS, ACSM RCEP, Exercise Physiologist;Portia Rollene Rotunda, RN, Maxcine Ham, RN, Roque Cash, RN   Supervising physician immediately available to respond to emergencies [facility] immediately available   Physician(s) Dr. Eliseo Squires   Medication changes reported     No   Fall or balance concerns reported    No   Warm-up and Cool-down Performed as group-led instruction   Resistance Training Performed Yes   VAD Patient? No   Pain Assessment   Currently in Pain? No/denies   Multiple Pain Sites No      Capillary Blood Glucose: No results found for this or any previous visit (from the past 24 hour(s)).      Exercise Prescription Changes - 09/12/15 1200    Response to Exercise   Blood Pressure (Admit) 100/54 mmHg   Blood Pressure (Exercise) 131/68 mmHg   Blood Pressure (Exit) 151/86 mmHg   Heart Rate (Admit) 67 bpm   Heart Rate (Exercise) 108 bpm   Heart Rate (Exit) 75 bpm   Oxygen Saturation (Admit) 99 %   Oxygen Saturation (Exercise) 88 %   Oxygen Saturation (Exit) 99 %   Rating of Perceived Exertion (Exercise) 11   Perceived Dyspnea (Exercise) 1   Duration Progress to 45 minutes of aerobic exercise without signs/symptoms of physical distress   Intensity THRR unchanged   Progression   Progression Continue to progress workloads to maintain intensity without signs/symptoms of physical distress.   Resistance Training   Training Prescription Yes   Weight blue bands   Reps 10-12   Interval Training   Interval Training No   Oxygen   Oxygen Continuous   Liters 3   NuStep   Level 3   Minutes 15   METs 3.1   Track   Laps 9   Minutes 15     Goals Met:  Exercise tolerated well Strength training completed today  Goals Unmet:  Not Applicable  Comments: Service time is from 1030 to 1200    Dr. Rush Farmer is Medical Director for Pulmonary Rehab at [facility].

## 2015-09-13 ENCOUNTER — Telehealth: Payer: Self-pay | Admitting: Pulmonary Disease

## 2015-09-13 DIAGNOSIS — J9611 Chronic respiratory failure with hypoxia: Secondary | ICD-10-CM

## 2015-09-13 NOTE — Telephone Encounter (Signed)
Molly @ Pulm Rehab would like to see if pt would qualify for POC eval.    BQ please advise. Thanks!

## 2015-09-17 ENCOUNTER — Encounter (HOSPITAL_COMMUNITY)
Admission: RE | Admit: 2015-09-17 | Discharge: 2015-09-17 | Disposition: A | Discharge status: Home / Self Care | Payer: 59 | Source: Ambulatory Visit | Attending: Cardiology | Admitting: Cardiology

## 2015-09-17 VITALS — Wt 196.4 lb

## 2015-09-17 DIAGNOSIS — R0600 Dyspnea, unspecified: Secondary | ICD-10-CM

## 2015-09-17 NOTE — Progress Notes (Signed)
Daily Session Note  Patient Details  Name: Joseph [name] MRN: [id number] Date of Birth: 1955-10-03 Referring Provider:        Pulmonary Rehab Walk Test from 08/06/2015 in [place]   Referring Provider  Dr. Aundra Dubin      Encounter Date: 09/17/2015  Check In:     Session Check In - 09/17/15 1009    Check-In   [facility]   Staff Present Su Hilt, MS, ACSM RCEP, Exercise Physiologist;Joan Leonia Reeves, RN, Luisa Hart, RN, BSN   Supervising physician immediately available to respond to emergencies [facility] immediately available   Physician(s) Dr. Marily Memos   Medication changes reported     No   Fall or balance concerns reported    No   Warm-up and Cool-down Performed as group-led instruction   Resistance Training Performed Yes   VAD Patient? No   Pain Assessment   Currently in Pain? No/denies   Multiple Pain Sites No      Capillary Blood Glucose: No results found for this or any previous visit (from the past 24 hour(s)).     POCT Glucose - 09/17/15 1205    POCT Blood Glucose   Pre-Exercise 239 mg/dL   Post-Exercise 233 mg/dL         Exercise Prescription Changes - 09/17/15 1200    Exercise Review   Progression Yes   Response to Exercise   Blood Pressure (Admit) 136/88 mmHg   Blood Pressure (Exercise) 126/80 mmHg   Blood Pressure (Exit) 130/74 mmHg   Heart Rate (Admit) 73 bpm   Heart Rate (Exercise) 79 bpm   Heart Rate (Exit) 65 bpm   Oxygen Saturation (Admit) 99 %   Oxygen Saturation (Exercise) 93 %   Oxygen Saturation (Exit) 100 %   Rating of Perceived Exertion (Exercise) 11   Perceived Dyspnea (Exercise) 1   Duration Progress to 45 minutes of aerobic exercise without signs/symptoms of physical distress   Intensity THRR unchanged   Progression   Progression Continue to progress workloads to maintain intensity without signs/symptoms of physical distress.   Resistance Training   Training  Prescription Yes   Weight blue bands   Reps 10-12   Interval Training   Interval Training No   Oxygen   Oxygen Continuous   Liters 3   Bike   Level 0.6   Minutes 15   NuStep   Level 6   Minutes 15   METs 2.8   Track   Laps 9   Minutes 15     Goals Met:  Exercise tolerated well No report of cardiac concerns or symptoms Strength training completed today  Goals Unmet:  Not Applicable  Comments: Service time is from 10:30AM to 12:05PM    Dr. Rush Farmer is Medical Director for Pulmonary Rehab at [facility].

## 2015-09-17 NOTE — Telephone Encounter (Signed)
OK by me to Rx portable oxygen concentrator

## 2015-09-17 NOTE — Telephone Encounter (Signed)
Called to speak with Joseph [name] - office closed - will call back tomorrow during office hours.

## 2015-09-18 NOTE — Telephone Encounter (Signed)
Called Pulm Rehab - Cloyde Reams does not work on Wednesdays LM with Thayer Headings to have [facility] call tomorrow

## 2015-09-19 ENCOUNTER — Ambulatory Visit (INDEPENDENT_AMBULATORY_CARE_PROVIDER_SITE_OTHER): Payer: 59 | Admitting: Pharmacist

## 2015-09-19 ENCOUNTER — Encounter (HOSPITAL_COMMUNITY)
Admission: RE | Admit: 2015-09-19 | Discharge: 2015-09-19 | Disposition: A | Discharge status: Home / Self Care | Payer: 59 | Source: Ambulatory Visit | Attending: Cardiology | Admitting: Cardiology

## 2015-09-19 ENCOUNTER — Telehealth: Payer: Self-pay | Admitting: Pulmonary Disease

## 2015-09-19 VITALS — Wt 193.8 lb

## 2015-09-19 DIAGNOSIS — G4730 Sleep apnea, unspecified: Secondary | ICD-10-CM | POA: Insufficient documentation

## 2015-09-19 DIAGNOSIS — Z992 Dependence on renal dialysis: Secondary | ICD-10-CM | POA: Diagnosis not present

## 2015-09-19 DIAGNOSIS — N186 End stage renal disease: Secondary | ICD-10-CM | POA: Diagnosis not present

## 2015-09-19 DIAGNOSIS — E1122 Type 2 diabetes mellitus with diabetic chronic kidney disease: Secondary | ICD-10-CM | POA: Insufficient documentation

## 2015-09-19 DIAGNOSIS — R569 Unspecified convulsions: Secondary | ICD-10-CM | POA: Diagnosis not present

## 2015-09-19 DIAGNOSIS — R0600 Dyspnea, unspecified: Secondary | ICD-10-CM | POA: Insufficient documentation

## 2015-09-19 DIAGNOSIS — Z87891 Personal history of nicotine dependence: Secondary | ICD-10-CM | POA: Diagnosis not present

## 2015-09-19 DIAGNOSIS — I482 Chronic atrial fibrillation, unspecified: Secondary | ICD-10-CM

## 2015-09-19 DIAGNOSIS — E1142 Type 2 diabetes mellitus with diabetic polyneuropathy: Secondary | ICD-10-CM | POA: Diagnosis not present

## 2015-09-19 DIAGNOSIS — Z7901 Long term (current) use of anticoagulants: Secondary | ICD-10-CM | POA: Diagnosis not present

## 2015-09-19 DIAGNOSIS — I120 Hypertensive chronic kidney disease with stage 5 chronic kidney disease or end stage renal disease: Secondary | ICD-10-CM | POA: Diagnosis not present

## 2015-09-19 DIAGNOSIS — Z794 Long term (current) use of insulin: Secondary | ICD-10-CM | POA: Insufficient documentation

## 2015-09-19 DIAGNOSIS — Z79899 Other long term (current) drug therapy: Secondary | ICD-10-CM | POA: Insufficient documentation

## 2015-09-19 DIAGNOSIS — I4821 Permanent atrial fibrillation: Secondary | ICD-10-CM

## 2015-09-19 LAB — POCT INR: INR: 4.5

## 2015-09-19 NOTE — Telephone Encounter (Signed)
Noted and will forward to BQ to make him aware

## 2015-09-19 NOTE — Progress Notes (Signed)
Daily Session Note  Patient Details  Name: Joseph [name] MRN: [id number] Date of Birth: 05-18-1955 Referring Provider:        Pulmonary Rehab Walk Test from 08/06/2015 in [place]   Referring Provider  Dr. Aundra Dubin      Encounter Date: 09/19/2015  Check In:     Session Check In - 09/19/15 1041    Check-In   [facility]   Staff Present Rosebud Poles, RN, BSN;Leilyn Frayre Ysidro Evert, RN;Portia Rollene Rotunda, RN, BSN;Ramon Dredge, RN, MHA;Molly diVincenzo, MS, ACSM RCEP, Exercise Physiologist   Supervising physician immediately available to respond to emergencies [facility] immediately available   Physician(s) Dr. Renaee Munda   Medication changes reported     No   Fall or balance concerns reported    No   Warm-up and Cool-down Performed as group-led instruction   Resistance Training Performed Yes   VAD Patient? No   Pain Assessment   Currently in Pain? No/denies   Multiple Pain Sites No      Capillary Blood Glucose: No results found for this or any previous visit (from the past 24 hour(s)).      Exercise Prescription Changes - 09/19/15 1300    Response to Exercise   Blood Pressure (Admit) 132/80 mmHg   Blood Pressure (Exercise) 163/93 mmHg   Blood Pressure (Exit) 143/87 mmHg   Heart Rate (Admit) 68 bpm   Heart Rate (Exercise) 87 bpm   Heart Rate (Exit) 67 bpm   Oxygen Saturation (Admit) 94 %   Oxygen Saturation (Exercise) 89 %   Oxygen Saturation (Exit) 99 %   Rating of Perceived Exertion (Exercise) 13   Perceived Dyspnea (Exercise) 1   Duration Progress to 45 minutes of aerobic exercise without signs/symptoms of physical distress   Intensity THRR unchanged   Progression   Progression Continue to progress workloads to maintain intensity without signs/symptoms of physical distress.   Resistance Training   Training Prescription Yes   Weight blue bands   Reps 10-12   Interval Training   Interval Training No   Oxygen   Oxygen Continuous   Liters 3   Bike   Level 0.6   Minutes 15     Goals Met:  Exercise tolerated well No report of cardiac concerns or symptoms Strength training completed today  Goals Unmet:  Not Applicable  Comments: Service time is from 1030 to 1240    Dr. Rush Farmer is Medical Director for Pulmonary Rehab at [facility].

## 2015-09-19 NOTE — Telephone Encounter (Signed)
Spoke with [facility], aware that POC okayed per BQ and order placed for Eval of POC.  Cloyde Reams does not have qualifying sats on the patient and is aware that we do not either. Pt is coming in today for Rehab and she will get qualifying sats.  Will await call back from [facility] with qualifying sats so that order can be placed.

## 2015-09-20 NOTE — Telephone Encounter (Signed)
49mw from pulmonary rehab yesterday in Epic.   Order placed.  Nothing further needed.

## 2015-09-24 ENCOUNTER — Ambulatory Visit (INDEPENDENT_AMBULATORY_CARE_PROVIDER_SITE_OTHER): Payer: Medicare Other | Admitting: Sports Medicine

## 2015-09-24 ENCOUNTER — Encounter: Payer: Self-pay | Admitting: Sports Medicine

## 2015-09-24 ENCOUNTER — Encounter (HOSPITAL_COMMUNITY)
Admission: RE | Admit: 2015-09-24 | Discharge: 2015-09-24 | Disposition: A | Discharge status: Home / Self Care | Payer: 59 | Source: Ambulatory Visit | Attending: Cardiology | Admitting: Cardiology

## 2015-09-24 VITALS — Wt 194.2 lb

## 2015-09-24 DIAGNOSIS — B351 Tinea unguium: Secondary | ICD-10-CM

## 2015-09-24 DIAGNOSIS — M79676 Pain in unspecified toe(s): Secondary | ICD-10-CM

## 2015-09-24 DIAGNOSIS — E1140 Type 2 diabetes mellitus with diabetic neuropathy, unspecified: Secondary | ICD-10-CM

## 2015-09-24 DIAGNOSIS — R0600 Dyspnea, unspecified: Secondary | ICD-10-CM

## 2015-09-24 DIAGNOSIS — E1149 Type 2 diabetes mellitus with other diabetic neurological complication: Secondary | ICD-10-CM | POA: Diagnosis not present

## 2015-09-24 NOTE — Progress Notes (Signed)
Patient ID: Joseph [name], male   DOB: October 21, 1955, 61 y.o.   MRN: [id number] Subjective: Joseph [name] is a 60 y.o. male patient with history of diabetes who presents to office today complaining of long, painful nails  while ambulating in shoes; unable to trim. Patient states that the glucose reading this morning was 150 mg/dl. Patient denies any new changes in medication or new problems. Patient denies any new cramping, numbness, burning or tingling in the legs. Patient is also on HD M/W/F.   Patient Active Problem List   Diagnosis Date Noted  . Interstitial lung disease ([place]) 05/02/2015  . Chronic respiratory failure with hypoxia ([place]) 05/02/2015  . PAF (paroxysmal atrial fibrillation) ([place]) 04/12/2015  . Seizures ([place]) 02/26/2015  . Debilitated 12/26/2014  . Acute encephalopathy   . Autoimmune encephalomyelitis   . Debility   . Atrial fibrillation with RVR ([place])   . Encephalopathy acute   . Nausea vomiting and diarrhea 12/10/2014  . Sepsis ([place]) 12/10/2014  . Pneumonia 12/10/2014  . Encephalitis   . Convulsion ([place])   . Recurrent Clostridium difficile diarrhea   . Limbic encephalitis   . Confusion   . Encephalopathy   . Hyperglycemia 11/18/2014  . Delirium 11/18/2014  . DKA, type 2 ([place]) 11/18/2014  . Hyperkalemia 11/18/2014  . Protein-calorie malnutrition, severe ([place]) 11/09/2014  . Volume overload 11/06/2014  . ESRD on hemodialysis ([place]) 11/06/2014  . Dyspnea 11/06/2014  . Abnormal LFTs 11/06/2014  . C. difficile diarrhea 11/06/2014  . Supratherapeutic INR   . HCAP (healthcare-associated pneumonia) 11/05/2014  . Rash 10/23/2014  . AF (amaurosis fugax) 07/31/2014  . AA (alcohol abuse) 07/31/2014  . Absolute anemia 07/31/2014  . Encounter for therapeutic drug level monitoring 07/31/2014  . Diabetes mellitus ([place]) 07/31/2014  . ESRD (end stage renal disease) ([place]) 07/31/2014  . BP (high blood pressure) 07/31/2014  . Gout 05/18/2014  . Erectile dysfunction 05/18/2014  .  Pulmonary hypertension ([place])   . Diabetes mellitus, controlled ([place])   . Chronic diastolic CHF (congestive heart failure) ([place]) 09/22/2013  . Hyperlipidemia 10/27/2011  . Obstructive sleep apnea 10/27/2011  . Essential hypertension 09/24/2011  . Atrial fibrillation-permanent    Current Outpatient Prescriptions on File Prior to Visit  Medication Sig Dispense Refill  . acetaminophen (TYLENOL) 500 MG tablet Take 1,000 mg by mouth every 6 (six) hours as needed (pain). Reported on 05/09/2015    . amiodarone (PACERONE) 200 MG tablet Take 200 mg by mouth daily.    Marland Kitchen amiodarone (PACERONE) 200 MG tablet TAKE 1 TABLET (200 MG TOTAL) BY MOUTH DAILY. 30 tablet 6  . atorvastatin (LIPITOR) 40 MG tablet Take 1 tablet (40 mg total) by mouth at bedtime. 30 tablet 1  . B Complex-C-Folic Acid (VOL-CARE RX) 1 MG TABS Take 1 tablet by mouth at bedtime.  11  . B-D ULTRAFINE III SHORT PEN 31G X 8 MM MISC USE AS DIRECTED ONCE A DAY  11  . budesonide-formoterol (SYMBICORT) 80-4.5 MCG/ACT inhaler Inhale 2 puffs into the lungs 2 (two) times daily. 1 Inhaler 5  . calcitRIOL (ROCALTROL) 0.25 MCG capsule Take 7 capsules (1.75 mcg total) by mouth every Monday, Wednesday, and Friday with hemodialysis.    Marland Kitchen colchicine 0.6 MG tablet Take 0.6 mg by mouth 2 (two) times daily.    Marland Kitchen ethyl chloride spray USE AS DIRECTED FOR DIALYSIS  3  . insulin glargine (LANTUS) 100 unit/mL SOPN Inject 17-20 Units into the skin daily before supper.     Marland Kitchen ketorolac (  ACULAR) 0.5 % ophthalmic solution Place 1 drop into the right eye See admin instructions. Instill 1 drop into right eye 4 times daily starting 72 hours prior to surgery  4  . lamoTRIgine (LAMICTAL) 100 MG tablet Take 1 tablet (100 mg total) by mouth 2 (two) times daily. 60 tablet 11  . lamoTRIgine (LAMICTAL) 25 MG tablet One po bid xone week, then 2 tabs po bid xone week, then 3 tabs po bid xone week, then 4 tabs po bid 100 tablet 0  . levETIRAcetam (KEPPRA) 500 MG tablet Take 500 mg  by mouth as directed. 500 mg twice a day. Mon, Wed, Fri on dialysis days an additional 250 mg with supper.    . loperamide (IMODIUM) 1 MG/5ML solution Take 6 mg by mouth 2 (two) times daily as needed for diarrhea or loose stools.    . metoprolol tartrate (LOPRESSOR) 25 MG tablet Take 25 mg by mouth as directed. Take 1 tablet (25 mg) twice daily on Monday, Wednesday, and Friday (Dialysis days) and take 2 tablets (50 mg) twice daily on Sunday, Tuesday, Thursday, Saturday    . sevelamer carbonate (RENVELA) 800 MG tablet Take 800-2,400 mg by mouth See admin instructions. Take 3 tablets (2400 mg) by mouth with meals and 1 tablet (800 mg) with snacks - total of 8 tablets daily    . warfarin (COUMADIN) 3 MG tablet Take as directed by coumadin clinic 65 tablet 3   No current facility-administered medications on file prior to visit.   Allergies  Allergen Reactions  . Keflex [Cephalexin] Other (See Comments)    c-diff reaction  . Dilaudid [Hydromorphone] Nausea And Vomiting  . Tape Itching    Redness    Recent Results (from the past 2160 hour(s))  POCT INR     Status: None   Collection Time: 07/09/15  4:19 PM  Result Value Ref Range   INR 6.7   Protime-INR     Status: Abnormal   Collection Time: 07/09/15  4:58 PM  Result Value Ref Range   Prothrombin Time 47.6 (H) 11.6 - 15.2 seconds   INR 5.40 (HH) 0.00 - 1.49    Comment: REPEATED TO VERIFY CRITICAL RESULT CALLED TO, READ BACK BY AND VERIFIED WITH: Serita Butcher RN [id number] GREEN R   POCT INR     Status: None   Collection Time: 07/18/15 12:31 PM  Result Value Ref Range   INR 3.4   POCT INR     Status: None   Collection Time: 07/30/15  1:09 PM  Result Value Ref Range   INR 3.6   Glucose, capillary     Status: Abnormal   Collection Time: 08/02/15 10:45 AM  Result Value Ref Range   Glucose-Capillary 118 (H) 65 - 99 mg/dL  Pulmonary function test     Status: None   Collection Time: 08/08/15  9:05 AM  Result Value Ref Range   FVC-Pre  2.50 L   FVC-%Pred-Pre 57 %   FVC-Post 2.58 L   FVC-%Pred-Post 59 %   FVC-%Change-Post 2 %   FEV1-Pre 1.76 L   FEV1-%Pred-Pre 52 %   FEV1-Post 1.90 L   FEV1-%Pred-Post 56 %   FEV1-%Change-Post 7 %   FEV6-Pre 2.48 L   FEV6-%Pred-Pre 59 %   FEV6-Post 2.58 L   FEV6-%Pred-Post 61 %   FEV6-%Change-Post 3 %   Pre FEV1/FVC ratio 70 %   FEV1FVC-%Pred-Pre 90 %   Post FEV1/FVC ratio 74 %   FEV1FVC-%Change-Post 4 %  Pre FEV6/FVC Ratio 99 %   FEV6FVC-%Pred-Pre 102 %   Post FEV6/FVC ratio 100 %   FEV6FVC-%Pred-Post 103 %   FEV6FVC-%Change-Post 0 %   FEF 25-75 Pre 1.01 L/sec   FEF2575-%Pred-Pre 32 %   FEF 25-75 Post 1.42 L/sec   FEF2575-%Pred-Post 45 %   FEF2575-%Change-Post 39 %   RV 2.28 L   RV % pred 96 %   TLC 4.96 L   TLC % pred 67 %   DLCO unc 13.13 ml/min/mmHg   DLCO unc % pred 37 %   DLCO cor 14.19 ml/min/mmHg   DLCO cor % pred 40 %   DL/VA 3.24 ml/min/mmHg/L   DL/VA % pred 68 %  Glucose, capillary     Status: Abnormal   Collection Time: 08/13/15 10:50 AM  Result Value Ref Range   Glucose-Capillary 216 (H) 65 - 99 mg/dL  Glucose, capillary     Status: Abnormal   Collection Time: 08/13/15 11:55 AM  Result Value Ref Range   Glucose-Capillary 162 (H) 65 - 99 mg/dL  POCT INR     Status: None   Collection Time: 08/13/15 12:38 PM  Result Value Ref Range   INR 5.8   Glucose, capillary     Status: Abnormal   Collection Time: 08/22/15 11:26 AM  Result Value Ref Range   Glucose-Capillary 314 (H) 65 - 99 mg/dL  POCT INR     Status: None   Collection Time: 08/22/15 12:18 PM  Result Value Ref Range   INR 2.2   Glucose, capillary     Status: Abnormal   Collection Time: 09/03/15 11:55 AM  Result Value Ref Range   Glucose-Capillary 159 (H) 65 - 99 mg/dL  POCT INR     Status: None   Collection Time: 09/05/15  1:06 PM  Result Value Ref Range   INR 3.5   POCT INR     Status: None   Collection Time: 09/19/15 12:57 PM  Result Value Ref Range   INR 4.5      Objective: General: Patient is awake, alert, and oriented x 3 and in no acute distress.  Integument: Skin is warm, dry and supple bilateral. Nails are tender, long, thickened and  dystrophic with subungual debris, consistent with onychomycosis, 1-5 bilateral. No signs of infection. No open lesions or preulcerative lesions present bilateral. Remaining integument unremarkable.  Vasculature:  Dorsalis Pedis pulse 1/4 bilateral. Posterior Tibial pulse  1/4 bilateral.  Capillary fill time <3 sec 1-5 bilateral. Positive hair growth to the level of the digits. Temperature gradient within normal limits. No varicosities present bilateral. No edema present bilateral.   Neurology: The patient has intact sensation measured with a 5.07/10g Semmes Weinstein Monofilament at all pedal sites bilateral . Vibratory sensation diminished bilateral with tuning fork. No Babinski sign present bilateral.   Musculoskeletal: No symptomatic pedal deformities noted bilateral. Muscular strength 5/5 in all lower extremity muscular groups bilateral without pain on range of motion . No tenderness with calf compression bilateral.  Assessment and Plan: Problem List Items Addressed This Visit    None    Visit Diagnoses    Pain due to onychomycosis of toenail    -  Primary    Diabetic neuropathy with neurologic complication ([place])          -Examined patient. -Discussed and educated patient on diabetic foot care, especially with  regards to the vascular, neurological and musculoskeletal systems.  -Stressed the importance of good glycemic control and the detriment of not  controlling glucose levels in relation to the foot. -Mechanically debrided all nails 1-5 bilateral using sterile nail nipper and filed with dremel without incident  -Answered all patient questions -Patient to return  in 3 months for at risk foot care -Patient advised to call the office if any problems or questions arise in the meantime.  Landis Martins, DPM

## 2015-09-24 NOTE — Progress Notes (Signed)
Daily Session Note  Patient Details  Name: Joseph [name] MRN: [id number] Date of Birth: 06-10-1955 Referring Provider:        Pulmonary Rehab Walk Test from 08/06/2015 in [facility]   Referring Provider  Dr. Aundra Dubin      Encounter Date: 09/24/2015  Check In:     Session Check In - 09/24/15 1013    Check-In   Location [facility]   Staff Present Rosebud Poles, RN, BSN;Lisa Ysidro Evert, RN;Portia Rollene Rotunda, RN, BSN;Ramon Dredge, RN, MHA;Elany Felix, MS, ACSM RCEP, Exercise Physiologist   Supervising physician immediately available to respond to emergencies [facility] immediately available   Physician(s) Dr. Marily Memos   Medication changes reported     No   Fall or balance concerns reported    No   Warm-up and Cool-down Performed as group-led instruction   Resistance Training Performed Yes   VAD Patient? No   Pain Assessment   Currently in Pain? No/denies   Multiple Pain Sites No      Capillary Blood Glucose: No results found for this or any previous visit (from the past 24 hour(s)).      Exercise Prescription Changes - 09/24/15 1200    Response to Exercise   Blood Pressure (Admit) 136/80 mmHg   Blood Pressure (Exercise) 148/83 mmHg   Blood Pressure (Exit) 150/90 mmHg   Heart Rate (Admit) 75 bpm   Heart Rate (Exercise) 83 bpm   Heart Rate (Exit) 71 bpm   Oxygen Saturation (Admit) 100 %   Oxygen Saturation (Exercise) 92 %   Oxygen Saturation (Exit) 98 %   Rating of Perceived Exertion (Exercise) 13   Perceived Dyspnea (Exercise) 1   Duration Progress to 45 minutes of aerobic exercise without signs/symptoms of physical distress   Intensity THRR unchanged   Progression   Progression Continue to progress workloads to maintain intensity without signs/symptoms of physical distress.   Resistance Training   Training Prescription Yes   Weight blue bands   Reps 10-12   Interval Training   Interval Training No   Oxygen   Oxygen Continuous   Liters 3   Bike   Level 0.6   Minutes 15   NuStep   Level 3   Minutes 15   METs 2.4   Track   Laps 9   Minutes 15     Goals Met:  Exercise tolerated well No report of cardiac concerns or symptoms Strength training completed today  Goals Unmet:  Not Applicable  Comments: Service time is from 10:30AM to 12:00PM    Dr. Rush Farmer is Medical Director for Pulmonary Rehab at [facility].

## 2015-09-26 ENCOUNTER — Encounter (HOSPITAL_COMMUNITY)
Admission: RE | Admit: 2015-09-26 | Discharge: 2015-09-26 | Disposition: A | Discharge status: Home / Self Care | Payer: 59 | Source: Ambulatory Visit | Attending: Cardiology | Admitting: Cardiology

## 2015-09-26 VITALS — Wt 196.0 lb

## 2015-09-26 DIAGNOSIS — R0600 Dyspnea, unspecified: Secondary | ICD-10-CM

## 2015-09-26 NOTE — Progress Notes (Signed)
Daily Session Note  Patient Details  Name: Joseph [name] MRN: [id number] Date of Birth: [birth year] Referring Provider:        Pulmonary Rehab Walk Test from 08/06/2015 in [facility]   Referring Provider  Dr. Aundra Dubin      Encounter Date: 09/26/2015  Check In:     Session Check In - 09/26/15 1047    Check-In   Location [facility]   Staff Present Rosebud Poles, RN, BSN;Molly diVincenzo, MS, ACSM RCEP, Exercise Physiologist;Lisa Ysidro Evert, Felipe Drone, RN, MHA;Portia Rollene Rotunda, RN, BSN   Supervising physician immediately available to respond to emergencies [facility] immediately available   Physician(s) Dr. Marily Memos   Medication changes reported     No   Fall or balance concerns reported    No   Warm-up and Cool-down Performed as group-led instruction   Resistance Training Performed Yes   VAD Patient? No   Pain Assessment   Currently in Pain? No/denies   Multiple Pain Sites No      Capillary Blood Glucose: No results found for this or any previous visit (from the past 24 hour(s)).      Exercise Prescription Changes - 09/26/15 1200    Exercise Review   Progression Yes   Response to Exercise   Blood Pressure (Admit) 131/84 mmHg   Blood Pressure (Exercise) 112/73 mmHg   Blood Pressure (Exit) 134/95 mmHg   Heart Rate (Admit) 69 bpm   Heart Rate (Exercise) 75 bpm   Heart Rate (Exit) 69 bpm   Oxygen Saturation (Admit) 95 %   Oxygen Saturation (Exercise) 97 %   Oxygen Saturation (Exit) 95 %   Rating of Perceived Exertion (Exercise) 13   Perceived Dyspnea (Exercise) 1   Duration Progress to 45 minutes of aerobic exercise without signs/symptoms of physical distress   Intensity THRR unchanged   Progression   Progression Continue to progress workloads to maintain intensity without signs/symptoms of physical distress.   Resistance Training   Training Prescription Yes   Weight blue bands   Reps 10-12   Interval Training    Interval Training No   Oxygen   Oxygen Continuous   Liters 3   Bike   Level 0.6   Minutes 15   Track   Laps 6   Minutes 15     Goals Met:  Exercise tolerated well Strength training completed today  Goals Unmet:  Not Applicable  Comments: Service time is from 1030 to 1145    Dr. Rush Farmer is Medical Director for Pulmonary Rehab at [facility].

## 2015-10-01 ENCOUNTER — Encounter (HOSPITAL_COMMUNITY)
Admission: RE | Admit: 2015-10-01 | Discharge: 2015-10-01 | Disposition: A | Discharge status: Home / Self Care | Payer: 59 | Source: Ambulatory Visit | Attending: Cardiology | Admitting: Cardiology

## 2015-10-01 ENCOUNTER — Ambulatory Visit (INDEPENDENT_AMBULATORY_CARE_PROVIDER_SITE_OTHER): Payer: 59 | Admitting: *Deleted

## 2015-10-01 VITALS — Wt 196.4 lb

## 2015-10-01 DIAGNOSIS — I4821 Permanent atrial fibrillation: Secondary | ICD-10-CM

## 2015-10-01 DIAGNOSIS — R0600 Dyspnea, unspecified: Secondary | ICD-10-CM

## 2015-10-01 DIAGNOSIS — I482 Chronic atrial fibrillation, unspecified: Secondary | ICD-10-CM

## 2015-10-01 LAB — POCT INR: INR: 6.6

## 2015-10-01 LAB — PROTIME-INR
INR: 5.2 — AB (ref 0.00–1.49)
PROTHROMBIN TIME: 46.1 s — AB (ref 11.6–15.2)

## 2015-10-01 NOTE — Progress Notes (Signed)
Daily Session Note  Patient Details  Name: Joseph [name] MRN: [id number] Date of Birth: 1955/08/09 Referring Provider:        Pulmonary Rehab Walk Test from 08/06/2015 in [facility]   Referring Provider  Dr. Aundra Dubin      Encounter Date: 10/01/2015  Check In:     Session Check In - 10/01/15 1115    Check-In   Location [facility]   Staff Present Rosebud Poles, RN, BSN;Lisa Ysidro Evert, RN;Portia Rollene Rotunda, RN, BSN;Ramon Dredge, RN, MHA;Molly diVincenzo, MS, ACSM RCEP, Exercise Physiologist   Supervising physician immediately available to respond to emergencies [facility] immediately available   Physician(s) Dr. Marily Memos   Medication changes reported     No   Fall or balance concerns reported    No   Warm-up and Cool-down Performed as group-led instruction   Resistance Training Performed Yes   VAD Patient? No   Pain Assessment   Currently in Pain? No/denies   Multiple Pain Sites No      Capillary Blood Glucose: No results found for this or any previous visit (from the past 24 hour(s)).     POCT Glucose - 10/01/15 1224    POCT Blood Glucose   Pre-Exercise 225 mg/dL   Post-Exercise 204 mg/dL         Exercise Prescription Changes - 10/01/15 1200    Exercise Review   Progression Yes   Response to Exercise   Blood Pressure (Admit) 160/95 mmHg   Blood Pressure (Exercise) 124/82 mmHg   Blood Pressure (Exit) 142/89 mmHg   Heart Rate (Admit) 69 bpm   Heart Rate (Exercise) 76 bpm   Heart Rate (Exit) 68 bpm   Oxygen Saturation (Admit) 97 %   Oxygen Saturation (Exercise) 91 %   Oxygen Saturation (Exit) 98 %   Rating of Perceived Exertion (Exercise) 13   Perceived Dyspnea (Exercise) 1   Duration Progress to 45 minutes of aerobic exercise without signs/symptoms of physical distress   Intensity THRR unchanged   Progression   Progression Continue to progress workloads to maintain intensity without signs/symptoms of physical  distress.   Resistance Training   Training Prescription Yes   Weight blue bands   Reps 10-12   Interval Training   Interval Training No   Oxygen   Oxygen Continuous   Liters 3   Bike   Level 0.8   Minutes 15   NuStep   Level 3   Minutes 15   Track   Laps 9   Minutes 15     Goals Met:  Improved SOB with ADL's Using PLB without cueing & demonstrates good technique Exercise tolerated well Strength training completed today  Goals Unmet:  Not Applicable  Comments: Service time is from 1030 to 1200    Dr. Rush Farmer is Medical Director for Pulmonary Rehab at [facility].

## 2015-10-02 ENCOUNTER — Telehealth (HOSPITAL_COMMUNITY): Payer: Self-pay | Admitting: *Deleted

## 2015-10-02 NOTE — Telephone Encounter (Signed)
Pt's wife left a VM earlier today stating pt's BP has been running high, yesterday it was 154/86 and today it was 169/92, she states it has been running in this area for past week or so.  Attempted to call her back to get more information with no answer, will try again

## 2015-10-03 ENCOUNTER — Encounter (HOSPITAL_COMMUNITY)
Admission: RE | Admit: 2015-10-03 | Discharge: 2015-10-03 | Disposition: A | Discharge status: Home / Self Care | Payer: 59 | Source: Ambulatory Visit | Attending: Cardiology | Admitting: Cardiology

## 2015-10-03 VITALS — Wt 196.9 lb

## 2015-10-03 DIAGNOSIS — R0600 Dyspnea, unspecified: Secondary | ICD-10-CM

## 2015-10-03 NOTE — Progress Notes (Signed)
Pulmonary Individual Treatment Plan  Patient Details  Name: Joseph [name] MRN: [id number] Date of Birth: 1955-12-21 Referring Provider:        Pulmonary Rehab Walk Test from 08/06/2015 in [place]   Referring Provider  Dr. Aundra Dubin      Initial Encounter Date:       Pulmonary Rehab Walk Test from 08/06/2015 in [place]   Date  08/06/15   Referring Provider  Dr. Aundra Dubin      Visit Diagnosis: Dyspnea  Patient's Home Medications on Admission:   Current outpatient prescriptions:  .  acetaminophen (TYLENOL) 500 MG tablet, Take 1,000 mg by mouth every 6 (six) hours as needed (pain). Reported on 05/09/2015, Disp: , Rfl:  .  amiodarone (PACERONE) 200 MG tablet, Take 200 mg by mouth daily., Disp: , Rfl:  .  amiodarone (PACERONE) 200 MG tablet, TAKE 1 TABLET (200 MG TOTAL) BY MOUTH DAILY., Disp: 30 tablet, Rfl: 6 .  atorvastatin (LIPITOR) 40 MG tablet, Take 1 tablet (40 mg total) by mouth at bedtime., Disp: 30 tablet, Rfl: 1 .  B Complex-C-Folic Acid (VOL-CARE RX) 1 MG TABS, Take 1 tablet by mouth at bedtime., Disp: , Rfl: 11 .  B-D ULTRAFINE III SHORT PEN 31G X 8 MM MISC, USE AS DIRECTED ONCE A DAY, Disp: , Rfl: 11 .  budesonide-formoterol (SYMBICORT) 80-4.5 MCG/ACT inhaler, Inhale 2 puffs into the lungs 2 (two) times daily., Disp: 1 Inhaler, Rfl: 5 .  calcitRIOL (ROCALTROL) 0.25 MCG capsule, Take 7 capsules (1.75 mcg total) by mouth every Monday, Wednesday, and Friday with hemodialysis., Disp: , Rfl:  .  colchicine 0.6 MG tablet, Take 0.6 mg by mouth 2 (two) times daily., Disp: , Rfl:  .  ethyl chloride spray, USE AS DIRECTED FOR DIALYSIS, Disp: , Rfl: 3 .  insulin glargine (LANTUS) 100 unit/mL SOPN, Inject 17-20 Units into the skin daily before supper. , Disp: , Rfl:  .  ketorolac (ACULAR) 0.5 % ophthalmic solution, Place 1 drop into the right eye See admin instructions. Instill 1 drop into right eye 4 times daily starting 72 hours  prior to surgery, Disp: , Rfl: 4 .  lamoTRIgine (LAMICTAL) 100 MG tablet, Take 1 tablet (100 mg total) by mouth 2 (two) times daily., Disp: 60 tablet, Rfl: 11 .  lamoTRIgine (LAMICTAL) 25 MG tablet, One po bid xone week, then 2 tabs po bid xone week, then 3 tabs po bid xone week, then 4 tabs po bid, Disp: 100 tablet, Rfl: 0 .  levETIRAcetam (KEPPRA) 500 MG tablet, Take 500 mg by mouth as directed. 500 mg twice a day. Mon, Wed, Fri on dialysis days an additional 250 mg with supper., Disp: , Rfl:  .  loperamide (IMODIUM) 1 MG/5ML solution, Take 6 mg by mouth 2 (two) times daily as needed for diarrhea or loose stools., Disp: , Rfl:  .  metoprolol tartrate (LOPRESSOR) 25 MG tablet, Take 25 mg by mouth as directed. Take 1 tablet (25 mg) twice daily on Monday, Wednesday, and Friday (Dialysis days) and take 2 tablets (50 mg) twice daily on Sunday, Tuesday, Thursday, Saturday, Disp: , Rfl:  .  sevelamer carbonate (RENVELA) 800 MG tablet, Take 800-2,400 mg by mouth See admin instructions. Take 3 tablets (2400 mg) by mouth with meals and 1 tablet (800 mg) with snacks - total of 8 tablets daily, Disp: , Rfl:  .  warfarin (COUMADIN) 3 MG tablet, Take as directed by coumadin clinic, Disp:  65 tablet, Rfl: 3  Past Medical History: Past Medical History  Diagnosis Date  . Atrial fibrillation ([place])     a. 11/18/2011 s/p DCCV - 150J;  b. Anticoagulation w/ Apixaban;  c. 11/2011 back in afib->asymptomatic.  . H/O alcohol abuse     a. drinks heavily on the weekends.  . Hypertension   . Diabetes mellitus ([place])   . Heart murmur     a. 09/2011 Echo: EF 55-60%, Triv AI, Mild MR, mildly dil LA.  . Diabetic peripheral neuropathy ([place])   . CKD (chronic kidney disease), stage III   . CHF (congestive heart failure) ([place])   . Pneumonia   . Sleep apnea   . Seizure ([place])   . 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 Former Smoker -- 0.01  packs/day for 5 years  . Types: Cigarettes  . Quit date: 04/20/1994  Smokeless tobacco  . Never Used    Labs: Recent Review Flowsheet Data    Labs for ITP Cardiac and Pulmonary Rehab Latest Ref Rng 12/19/2014 12/20/2014 12/21/2014 12/22/2014 04/25/2015   HCO3 20.0 - 24.0 mEq/L - - - - 32.6(H)   TCO2 0 - 100 mmol/L _0 34   O2SAT - - - - - 67.0      Capillary Blood Glucose: Lab Results  Component Value Date   GLUCAP 159* 09/03/2015   GLUCAP 314* 08/22/2015   GLUCAP 162* 08/13/2015   GLUCAP 216* 08/13/2015   GLUCAP 118* 08/02/2015       POCT Glucose      08/13/15 1307 08/20/15 1237 08/22/15 1115 08/27/15 1226 08/29/15 1305   POCT Blood Glucose   Pre-Exercise 216 mg/dL 216 mg/dL 314 mg/dL 135 mg/dL 158 mg/dL   Post-Exercise 162 mg/dL 238 mg/dL  179 mg/dL 170 mg/dL     09/03/15 1206 09/10/15 1219 09/17/15 1205 10/01/15 1224     POCT Blood Glucose   Pre-Exercise 145 mg/dL 207 mg/dL 239 mg/dL 225 mg/dL    Post-Exercise 159 mg/dL 181 mg/dL 233 mg/dL 204 mg/dL       ADL UCSD:     Pulmonary Assessment Scores      08/06/15 1213       ADL UCSD   SOB Score total 99        Pulmonary Function Assessment:     Pulmonary Function Assessment - 08/02/15 1050    Breath   Bilateral Breath Sounds Clear   Shortness of Breath Yes      Exercise Target Goals:    Exercise Program Goal: Individual exercise prescription set with THRR, safety & activity barriers. Participant demonstrates ability to understand and report RPE using BORG scale, to self-measure pulse accurately, and to acknowledge the importance of the exercise prescription.  Exercise Prescription Goal: Starting with aerobic activity 30 plus minutes a day, 3 days per week for initial exercise prescription. Provide home exercise prescription and guidelines that participant acknowledges understanding prior to discharge.  Activity Barriers & Risk Stratification:     Activity Barriers & Cardiac Risk Stratification  - 08/02/15 1048    Activity Barriers & Cardiac Risk Stratification   Activity Barriers Deconditioning;Shortness of Breath      6 Minute Walk:     6 Minute Walk      08/06/15 1210 09/19/15 1424     6 Minute Walk   Phase Initial Mid Program    Distance 950  feet 750 feet    Walk Time 6 minutes 5 minutes    # of Rest Breaks 0 1    MPH 1.8     METS 3.05     RPE 11 13    Perceived Dyspnea  0 1    VO2 Peak 10.69     Symptoms No No    Resting HR 92 bpm 98 bpm    Resting BP 126/70 mmHg 104/60 mmHg    Max Ex. HR 98 bpm 91 bpm    Max Ex. BP 148/74 mmHg 104/60 mmHg    Interval HR   Baseline HR 92 98    1 Minute HR  81    2 Minute HR 85 91    3 Minute HR  84    4 Minute HR 90 84    5 Minute HR  84    6 Minute HR 87 86    Interval Heart Rate? Yes     Interval Oxygen   Interval Oxygen?  Yes    Baseline Oxygen Saturation % 93 % 92 %    Baseline Liters of Oxygen 2 L 0 L    1 Minute Oxygen Saturation % 95 % 92 %    1 Minute Liters of Oxygen 2 L 0 L    2 Minute Oxygen Saturation % 94 % 86 %    2 Minute Liters of Oxygen 2 L 0 L    3 Minute Oxygen Saturation % 91 % 87 %    3 Minute Liters of Oxygen 2 L 2 L    4 Minute Oxygen Saturation % 90 % 88 %    4 Minute Liters of Oxygen 2 L 2 L    5 Minute Oxygen Saturation % 88 % 91 %    5 Minute Liters of Oxygen 2 L 2 L    6 Minute Oxygen Saturation % 87 % 95 %    6 Minute Liters of Oxygen 2 L 2 L    2 Minute Post Oxygen Saturation % 86 % 92 %    2 Minute Post Liters of Oxygen 2 L 2 L       Initial Exercise Prescription:     Initial Exercise Prescription - 08/06/15 1600    Date of Initial Exercise RX and Referring Provider   Date 08/06/15   Referring Provider Dr. Aundra Dubin   Oxygen   Oxygen Continuous   Liters 2   Bike   Level 0.6   Minutes 15   NuStep   Level 2   Minutes 15   METs 1.5   Track   Laps 5   Minutes 15   Prescription Details   Frequency (times per week) 2   Duration Progress to 45 minutes of aerobic exercise  without signs/symptoms of physical distress   Intensity   THRR 40-80% of Max Heartrate 64-128   Ratings of Perceived Exertion 11-13   Perceived Dyspnea 0-4   Progression   Progression Continue progressive overload as per policy without signs/symptoms or physical distress.   Resistance Training   Training Prescription Yes   Weight blue bands   Reps 10-12      Perform Capillary Blood Glucose checks as needed.  Exercise Prescription Changes:     Exercise Prescription Changes      08/13/15 1300 08/20/15 1200 08/27/15 1200 08/29/15 1300 09/03/15 1200   Exercise Review   Progression  Yes      Response to Exercise   Blood Pressure (  Admit) 112/60 mmHg 102/60 mmHg 108/80 mmHg 106/68 mmHg 120/74 mmHg   Blood Pressure (Exercise) 158/84 mmHg 116/60 mmHg 100/70 mmHg 123/74 mmHg 120/80 mmHg   Blood Pressure (Exit) 104/60 mmHg 128/70 mmHg 100/76 mmHg 112/68 mmHg 125/76 mmHg   Heart Rate (Admit) 82 bpm 79 bpm 75 bpm 91 bpm 72 bpm   Heart Rate (Exercise) 90 bpm 114 bpm 89 bpm 107 bpm 82 bpm   Heart Rate (Exit) 78 bpm 86 bpm 76 bpm 101 bpm 76 bpm   Oxygen Saturation (Admit) 93 % 99 % 95 % 100 % 100 %   Oxygen Saturation (Exercise) 84 % 86 % 89 % 91 % 88 %   Oxygen Saturation (Exit) 100 % 98 % 99 % 100 % 96 %   Rating of Perceived Exertion (Exercise) _0 Perceived Dyspnea (Exercise) 0 0 1 0 2   Duration Progress to 45 minutes of aerobic exercise without signs/symptoms of physical distress Progress to 45 minutes of aerobic exercise without signs/symptoms of physical distress Progress to 45 minutes of aerobic exercise without signs/symptoms of physical distress Progress to 45 minutes of aerobic exercise without signs/symptoms of physical distress Progress to 45 minutes of aerobic exercise without signs/symptoms of physical distress   Intensity --  40-80% HRR THRR unchanged THRR unchanged THRR unchanged THRR unchanged   Progression   Progression  Continue progressive overload as per  policy without signs/symptoms or physical distress. Continue to progress workloads to maintain intensity without signs/symptoms of physical distress. Continue to progress workloads to maintain intensity without signs/symptoms of physical distress. Continue to progress workloads to maintain intensity without signs/symptoms of physical distress.   Resistance Training   Training Prescription _1    Weight _2    Reps 10-12 10-12 10-12 10-12 10-12   Oxygen   Oxygen _3    Liters increased to _4 Bike   Level 0.6 0.6 0.6 0.6 0.6   Minutes _5 NuStep   Level _6 Minutes _7 METs 1.4 2.1 2  2.2   Track   Laps _8 Minutes _9 09/05/15 1400 09/10/15 1200 09/12/15 1200 09/17/15 1200 09/19/15 1300   Exercise Review   Progression    Yes    Response to Exercise   Blood Pressure (Admit) 125/83 mmHg 118/56 mmHg 100/54 mmHg 136/88 mmHg 132/80 mmHg   Blood Pressure (Exercise) 110/79 mmHg 128/85 mmHg 131/68 mmHg 126/80 mmHg 163/93 mmHg   Blood Pressure (Exit) 139/75 mmHg 132/84 mmHg 151/86 mmHg 130/74 mmHg 143/87 mmHg   Heart Rate (Admit) 75 bpm 70 bpm 67 bpm 73 bpm 68 bpm   Heart Rate (Exercise) 88 bpm 85 bpm 108 bpm 79 bpm 87 bpm   Heart Rate (Exit) 87 bpm 73 bpm 75 bpm 65 bpm 67 bpm   Oxygen Saturation (Admit) 100 % 95 % 99 % 99 % 94 %   Oxygen Saturation (Exercise) 93 % 88 % 88 % 93 % 89 %   Oxygen Saturation (Exit) 99 % 99 % 99 % 100 % 99 %   Rating of Perceived Exertion (Exercise) _10 Perceived Dyspnea (Exercise) _11 Duration Progress to 45  minutes of aerobic exercise without signs/symptoms of physical distress Progress to 45 minutes of aerobic exercise without signs/symptoms of physical distress Progress to 45 minutes of aerobic exercise without signs/symptoms of physical distress Progress to 45  minutes of aerobic exercise without signs/symptoms of physical distress Progress to 45 minutes of aerobic exercise without signs/symptoms of physical distress   Intensity _0    Progression   Progression Continue to progress workloads to maintain intensity without signs/symptoms of physical distress. Continue to progress workloads to maintain intensity without signs/symptoms of physical distress. Continue to progress workloads to maintain intensity without signs/symptoms of physical distress. Continue to progress workloads to maintain intensity without signs/symptoms of physical distress. Continue to progress workloads to maintain intensity without signs/symptoms of physical distress.   Resistance Training   Training Prescription No No Yes Yes Yes   Weight _1    Reps 10-12 10-12 10-12 10-12 10-12   Interval Training   Interval Training   No No No   Oxygen   Oxygen _2    Liters _3 Bike   Level 0.6 0.6  0.6 0.6   Minutes _4 NuStep   Level _5 Minutes _6 METs 1.9 2.4 3.1 2.8    Track   Laps  _7 Minutes   15 15      09/24/15 1200 09/26/15 1200 10/01/15 1200       Exercise Review   Progression  Yes Yes     Response to Exercise   Blood Pressure (Admit) 136/80 mmHg 131/84 mmHg 160/95 mmHg     Blood Pressure (Exercise) 148/83 mmHg 112/73 mmHg 124/82 mmHg     Blood Pressure (Exit) 150/90 mmHg 134/95 mmHg 142/89 mmHg     Heart Rate (Admit) 75 bpm 69 bpm 69 bpm     Heart Rate (Exercise) 83 bpm 75 bpm 76 bpm     Heart Rate (Exit) 71 bpm 69 bpm 68 bpm     Oxygen Saturation (Admit) 100 % 95 % 97 %     Oxygen Saturation (Exercise) 92 % 97 % 91 %     Oxygen Saturation (Exit) 98 % 95 % 98 %     Rating of Perceived Exertion (Exercise) _8 Perceived Dyspnea (Exercise) _9 Comments   reviewed home exercise with patient     Duration Progress to 45 minutes of aerobic exercise without signs/symptoms of physical distress Progress to 45 minutes of aerobic exercise without signs/symptoms of physical distress Progress to 45 minutes of aerobic exercise without signs/symptoms of physical distress     Intensity THRR unchanged THRR unchanged THRR unchanged     Progression   Progression Continue to progress workloads to maintain intensity without signs/symptoms of physical distress. Continue to progress workloads to maintain intensity without signs/symptoms of physical distress. Continue to progress workloads to maintain intensity without signs/symptoms of physical distress.     Resistance Training   Training Prescription Yes Yes Yes     Weight blue bands blue bands blue bands     Reps 10-12 10-12 10-12     Interval Training   Interval Training No No No     Oxygen   Oxygen Continuous Continuous  Continuous     Liters _0 Bike   Level 0.6 0.6 0.8     Minutes _1 NuStep   Level 3  3     Minutes 15  15     METs 2.4       Track   Laps _2 Minutes _3 Exercise Comments:     Exercise Comments      09/05/15 0850 10/01/15 1513 10/03/15 0753       Exercise Comments Patient is cont. to progress exercise intensity. Patient is currently walking 10 laps in 15 minutes. Will continue to monitor and progress as appropriate. reviewed home exercise with patient Patient in cont. to increase exercise intensity here and at home. Patient is going to begin walking at home up and down driveway. Patient is open to increasing exercise intensity here at rehab in small amounts due to back pain.         Discharge Exercise Prescription (Final Exercise Prescription Changes):     Exercise Prescription Changes - 10/01/15 1200    Exercise Review   Progression Yes   Response to Exercise   Blood Pressure (Admit) 160/95 mmHg   Blood Pressure  (Exercise) 124/82 mmHg   Blood Pressure (Exit) 142/89 mmHg   Heart Rate (Admit) 69 bpm   Heart Rate (Exercise) 76 bpm   Heart Rate (Exit) 68 bpm   Oxygen Saturation (Admit) 97 %   Oxygen Saturation (Exercise) 91 %   Oxygen Saturation (Exit) 98 %   Rating of Perceived Exertion (Exercise) 13   Perceived Dyspnea (Exercise) 1   Comments reviewed home exercise with patient   Duration Progress to 45 minutes of aerobic exercise without signs/symptoms of physical distress   Intensity THRR unchanged   Progression   Progression Continue to progress workloads to maintain intensity without signs/symptoms of physical distress.   Resistance Training   Training Prescription Yes   Weight blue bands   Reps 10-12   Interval Training   Interval Training No   Oxygen   Oxygen Continuous   Liters 3   Bike   Level 0.8   Minutes 15   NuStep   Level 3   Minutes 15   Track   Laps 9   Minutes 15       Nutrition:  Target Goals: Understanding of nutrition guidelines, daily intake of sodium <159m, cholesterol <2041m calories 30% from fat and 7% or less from saturated fats, daily to have 5 or more servings of fruits and vegetables.  Biometrics:     Pre Biometrics - 08/06/15 1220    Pre Biometrics   Grip Strength 40 kg       Nutrition Therapy Plan and Nutrition Goals:     Nutrition Therapy & Goals - 09/05/15 1327    Nutrition Therapy   Diet Diabetic, Renal   Drug/Food Interactions Coumadin/Vit K   Personal Nutrition Goals   Personal Goal #1 CBG's in the normal range or as close to normal as is safely possible.   Intervention Plan   Intervention Prescribe, educate and counsel regarding individualized specific dietary modifications aiming towards targeted core components such as weight, hypertension, lipid management, diabetes, heart failure and other comorbidities.   Expected Outcomes Short Term Goal: Understand basic principles of dietary content, such as calories, fat, sodium,  cholesterol and nutrients.;Long Term Goal: Adherence to prescribed nutrition  plan.      Nutrition Discharge: Rate Your Plate Scores:     Nutrition Assessments - 09/05/15 1324    Rate Your Plate Scores   Pre Score 50      Psychosocial: Target Goals: Acknowledge presence or absence of depression, maximize coping skills, provide positive support system. Participant is able to verbalize types and ability to use techniques and skills needed for reducing stress and depression.  Initial Review & Psychosocial Screening:     Initial Psych Review & Screening - 08/02/15 [place]? Yes   Barriers   Psychosocial barriers to participate in program There are no identifiable barriers or psychosocial needs.   Screening Interventions   Interventions Encouraged to exercise      Quality of Life Scores:     Quality of Life - 08/06/15 1213    Quality of Life Scores   Health/Function Pre 26 %   Socioeconomic Pre 23.33 %   Psych/Spiritual Pre 29 %   Family Pre 20 %   GLOBAL Pre 25.27 %      PHQ-9:     Recent Review Flowsheet Data    Depression screen [facility] 2/9 08/02/2015 05/17/2014   Decreased Interest 0 0   Down, Depressed, Hopeless 0 0   PHQ - 2 Score 0 0      Psychosocial Evaluation and Intervention:   Psychosocial Re-Evaluation:     Psychosocial Re-Evaluation      09/03/15 1453 10/03/15 1141         Psychosocial Re-Evaluation   Interventions Encouraged to attend Pulmonary Rehabilitation for the exercise Encouraged to attend Pulmonary Rehabilitation for the exercise      Comments patient continues to suffer from short term memory loss. He initially had trouble adhering to pulmonary rehab diabetic protocol. He has done much better with checking his bloodsugars prior to and post exercise with prompting and with less resistance.  patient has adjusted well to pulmonary rehab. He has now adapted to the daily routien and interactive during  education class. His wife states he is now excited about pulmonary rehab and feels he is making progress towards his rehab goals.      Continued Psychosocial Services Needed  Yes        Education: Education Goals: Education classes will be provided on a weekly basis, covering required topics. Participant will state understanding/return demonstration of topics presented.  Learning Barriers/Preferences:     Learning Barriers/Preferences - 08/02/15 1049    Learning Barriers/Preferences   Learning Barriers Sight  memory   Learning Preferences Individual Instruction;Written Material;Skilled Demonstration;Verbal Instruction      Education Topics: Risk Factor Reduction:  -Group instruction that is supported by a PowerPoint presentation. Instructor discusses the definition of a risk factor, different risk factors for pulmonary disease, and how the heart and lungs work together.     Nutrition for Pulmonary Patient:  -Group instruction provided by PowerPoint slides, verbal discussion, and written materials to support subject matter. The instructor gives an explanation and review of healthy diet recommendations, which includes a discussion on weight management, recommendations for fruit and vegetable consumption, as well as protein, fluid, caffeine, fiber, sodium, sugar, and alcohol. Tips for eating when patients are short of breath are discussed.          PULMONARY REHAB OTHER RESPIRATORY from 09/26/2015 in [place]   Date  09/19/15   Educator  RD   Instruction Review Code  2-  meets goals/outcomes      Pursed Lip Breathing:  -Group instruction that is supported by demonstration and informational handouts. Instructor discusses the benefits of pursed lip and diaphragmatic breathing and detailed demonstration on how to preform both.        PULMONARY REHAB OTHER RESPIRATORY from 09/26/2015 in [place]   Date  09/12/15   Educator   Cloyde Reams diVincenzo   Instruction Review Code  2- meets goals/outcomes      Oxygen Safety:  -Group instruction provided by PowerPoint, verbal discussion, and written material to support subject matter. There is an overview of "What is Oxygen" and "Why do we need it".  Instructor also reviews how to create a safe environment for oxygen use, the importance of using oxygen as prescribed, and the risks of noncompliance. There is a brief discussion on traveling with oxygen and resources the patient may utilize.   Oxygen Equipment:  -Group instruction provided by [facility] Staff utilizing handouts, written materials, and equipment demonstrations.   Signs and Symptoms:  -Group instruction provided by written material and verbal discussion to support subject matter. Warning signs and symptoms of infection, stroke, and heart attack are reviewed and when to call the physician/911 reinforced. Tips for preventing the spread of infection discussed.   Advanced Directives:  -Group instruction provided by verbal instruction and written material to support subject matter. Instructor reviews Advanced Directive laws and proper instruction for filling out document.   Pulmonary Video:  -Group video education that reviews the importance of medication and oxygen compliance, exercise, good nutrition, pulmonary hygiene, and pursed lip and diaphragmatic breathing for the pulmonary patient.      PULMONARY REHAB OTHER RESPIRATORY from 09/26/2015 in [place]   Date  09/26/15   Instruction Review Code  2- meets goals/outcomes      Exercise for the Pulmonary Patient:  -Group instruction that is supported by a PowerPoint presentation. Instructor discusses benefits of exercise, core components of exercise, frequency, duration, and intensity of an exercise routine, importance of utilizing pulse oximetry during exercise, safety while exercising, and options of places to exercise outside of  rehab.        PULMONARY REHAB OTHER RESPIRATORY from 09/26/2015 in [place]   Date  08/22/15   Educator  EP   Instruction Review Code  2- meets goals/outcomes      Pulmonary Medications:  -Verbally interactive group education provided by instructor with focus on inhaled medications and proper administration.   Anatomy and Physiology of the Respiratory System and Intimacy:  -Group instruction provided by PowerPoint, verbal discussion, and written material to support subject matter. Instructor reviews respiratory cycle and anatomical components of the respiratory system and their functions. Instructor also reviews differences in obstructive and restrictive respiratory diseases with examples of each. Intimacy, Sex, and Sexuality differences are reviewed with a discussion on how relationships can change when diagnosed with pulmonary disease. Common sexual concerns are reviewed.      PULMONARY REHAB OTHER RESPIRATORY from 09/26/2015 in [place]   Date  08/29/15   Educator  -- [RN]   Instruction Review Code  2- meets goals/outcomes      Knowledge Questionnaire Score:     Knowledge Questionnaire Score - 08/27/15 1541    Knowledge Questionnaire Score   Pre Score --  reviewed with patient 08/27/15      Core Components/Risk Factors/Patient Goals at Admission:  Personal Goals and Risk Factors at Admission - 08/02/15 1116    Core Components/Risk Factors/Patient Goals on Admission    Weight Management Yes   Intervention Weight Management: Develop a combined nutrition and exercise program designed to reach desired caloric intake, while maintaining appropriate intake of nutrient and fiber, sodium and fats, and appropriate energy expenditure required for the weight goal.;Weight Management: Provide education and appropriate resources to help participant work on and attain dietary goals.;Weight Management/Obesity: Establish  reasonable short term and long term weight goals.   Expected Outcomes Weight Maintenance: Understanding of the daily nutrition guidelines, which includes 25-35% calories from fat, 7% or less cal from saturated fats, less than 249m cholesterol, less than 1.5gm of sodium, & 5 or more servings of fruits and vegetables daily;Understanding recommendations for meals to include 15-35% energy as protein, 25-35% energy from fat, 35-60% energy from carbohydrates, less than 2097mof dietary cholesterol, 20-35 gm of total fiber daily;Understanding of distribution of calorie intake throughout the day with the consumption of 4-5 meals/snacks;Short Term: Continue to assess and modify interventions until short term weight is achieved;Long Term: Adherence to nutrition and physical activity/exercise program aimed toward attainment of established weight goal   Increase Strength and Stamina Yes   Intervention Provide advice, education, support and counseling about physical activity/exercise needs.;Develop an individualized exercise prescription for aerobic and resistive training based on initial evaluation findings, risk stratification, comorbidities and participant's personal goals.   Expected Outcomes Achievement of increased cardiorespiratory fitness and enhanced flexibility, muscular endurance and strength shown through measurements of functional capacity and personal statement of participant.   Improve shortness of breath with ADL's Yes   Intervention Provide education, individualized exercise plan and daily activity instruction to help decrease symptoms of SOB with activities of daily living.   Expected Outcomes Short Term: Achieves a reduction of symptoms when performing activities of daily living.   Develop more efficient breathing techniques such as purse lipped breathing and diaphragmatic breathing; and practicing self-pacing with activity Yes   Intervention Provide education, demonstration and support about  specific breathing techniuqes utilized for more efficient breathing. Include techniques such as pursed lipped breathing, diaphragmatic breathing and self-pacing activity.   Expected Outcomes Short Term: Participant will be able to demonstrate and use breathing techniques as needed throughout daily activities.   Diabetes Yes   Intervention Provide education about signs/symptoms and action to take for hypo/hyperglycemia.;Provide education about proper nutrition, including hydration, and aerobic/resistive exercise prescription along with prescribed medications to achieve blood glucose in normal ranges: Fasting glucose 65-99 mg/dL   Expected Outcomes Short Term: Participant verbalizes understanding of the signs/symptoms and immediate care of hyper/hypoglycemia, proper foot care and importance of medication, aerobic/resistive exercise and nutrition plan for blood glucose control.;Long Term: Attainment of HbA1C < 7%.   Hypertension Yes   Intervention Provide education on lifestyle modifcations including regular physical activity/exercise, weight management, moderate sodium restriction and increased consumption of fresh fruit, vegetables, and low fat dairy, alcohol moderation, and smoking cessation.;Monitor prescription use compliance.   Expected Outcomes Short Term: Continued assessment and intervention until BP is < 140/9054mG in hypertensive participants. < 130/53m14m in hypertensive participants with diabetes, heart failure or chronic kidney disease.;Long Term: Maintenance of blood pressure at goal levels.   Personal Goal Other Yes   Personal Goal play golf again, increase stamina and strenght, maintain oxygenation on RA so that suplemental O2 can be discontinued   Intervention increase workloads on the exercise equiptment as tolerated inorder to increase stamina and strength,  encourage pursed lip breathing, do 6 min walk test off O2 to determine continuous need for O2   Expected Outcomes patient will be  able to play golf this spring, will stated his stamina and strength have increased      Core Components/Risk Factors/Patient Goals Review:      Goals and Risk Factor Review      09/05/15 0849 09/05/15 1325 10/03/15 1143       Core Components/Risk Factors/Patient Goals Review   Personal Goals Review Improve shortness of breath with ADL's;Increase knowledge of respiratory medications and ability to use respiratory devices properly.;Hypertension Diabetes Weight Management/Obesity;Increase Strength and Stamina;Improve shortness of breath with ADL's;Increase knowledge of respiratory medications and ability to use respiratory devices properly.;Develop more efficient breathing techniques such as purse lipped breathing and diaphragmatic breathing and practicing self-pacing with activity.;Diabetes;Hypertension     Review see "comments" section on ITP see 09/05/15 RD note patient is doing extremely well in rehab. He is toleratting increased workloads on equiptment. His stamina and strength is improving and he states his shortness of breath is decreasing with activity.     Expected Outcomes see Admission expected outcomes see Admission expected outcomes see Admission expected outcomes        Core Components/Risk Factors/Patient Goals at Discharge (Final Review):      Goals and Risk Factor Review - 10/03/15 1143    Core Components/Risk Factors/Patient Goals Review   Personal Goals Review Weight Management/Obesity;Increase Strength and Stamina;Improve shortness of breath with ADL's;Increase knowledge of respiratory medications and ability to use respiratory devices properly.;Develop more efficient breathing techniques such as purse lipped breathing and diaphragmatic breathing and practicing self-pacing with activity.;Diabetes;Hypertension   Review patient is doing extremely well in rehab. He is toleratting increased workloads on equiptment. His stamina and strength is improving and he states his shortness  of breath is decreasing with activity.   Expected Outcomes see Admission expected outcomes      ITP Comments:   Comments: ITP REVIEW Pt is making expected progress toward personal goals after completing 15 sessions.   Recommend continued exercise, life style modification, education, and utilization of breathing techniques to increase stamina and strength and decrease shortness of breath with exertion.

## 2015-10-03 NOTE — Telephone Encounter (Signed)
Can add amlodipine 5 mg daily.  Take BP daily and call with readings in 2 wks.

## 2015-10-03 NOTE — Progress Notes (Signed)
Daily Session Note  Patient Details  Name: JAQUES MINEER MRN: [id number] Date of Birth: Mar 07, 1956 Referring Provider:        Pulmonary Rehab Walk Test from 08/06/2015 in [facility]   Referring Provider  Dr. Aundra Dubin      Encounter Date: 10/03/2015  Check In:     Session Check In - 10/03/15 1034    Check-In   Location [facility]   Staff Present Rosebud Poles, RN, Luisa Hart, RN, BSN;Ramon Dredge, RN, MHA;Ladarrius Bogdanski, MS, ACSM RCEP, Exercise Physiologist   Supervising physician immediately available to respond to emergencies [facility] immediately available   Physician(s) Dr. Waldron Labs   Medication changes reported     No   Fall or balance concerns reported    No   Warm-up and Cool-down Performed as group-led instruction   Resistance Training Performed Yes   VAD Patient? No   Pain Assessment   Currently in Pain? No/denies   Multiple Pain Sites No      Capillary Blood Glucose: No results found for this or any previous visit (from the past 24 hour(s)).     POCT Glucose - 10/03/15 1229    POCT Blood Glucose   Pre-Exercise 141 mg/dL   Post-Exercise 121 mg/dL         Exercise Prescription Changes - 10/03/15 1200    Response to Exercise   Blood Pressure (Admit) 141/85 mmHg   Blood Pressure (Exercise) 125/78 mmHg   Blood Pressure (Exit) 136/80 mmHg   Heart Rate (Admit) 70 bpm   Heart Rate (Exercise) 74 bpm   Heart Rate (Exit) 67 bpm   Oxygen Saturation (Admit) 100 %   Oxygen Saturation (Exercise) 94 %   Oxygen Saturation (Exit) 95 %   Rating of Perceived Exertion (Exercise) 13   Perceived Dyspnea (Exercise) 1   Comments reviewed home exercise with patient   Duration Progress to 45 minutes of aerobic exercise without signs/symptoms of physical distress   Intensity THRR unchanged   Progression   Progression Continue to progress workloads to maintain intensity without signs/symptoms of physical  distress.   Resistance Training   Training Prescription Yes   Weight blue bands   Reps 10-12   Interval Training   Interval Training No   Oxygen   Oxygen Continuous   Liters 3   Bike   Level 0.8   Minutes 15   NuStep   Level 3   Minutes 15   METs 2.6     Goals Met:  Exercise tolerated well No report of cardiac concerns or symptoms Strength training completed today  Goals Unmet:  Not Applicable  Comments: Service time is from 10:30am to 12:15pm    Dr. Rush Farmer is Medical Director for Pulmonary Rehab at [facility].

## 2015-10-04 MED ORDER — AMLODIPINE BESYLATE 5 MG PO TABS: 5.0000 mg | ORAL_TABLET | Freq: Every day | ORAL | Status: AC

## 2015-10-04 NOTE — Telephone Encounter (Signed)
pts wife aware and voiced understanding

## 2015-10-08 ENCOUNTER — Encounter (HOSPITAL_COMMUNITY)
Admission: RE | Admit: 2015-10-08 | Discharge: 2015-10-08 | Disposition: A | Discharge status: Home / Self Care | Payer: 59 | Source: Ambulatory Visit | Attending: Cardiology | Admitting: Cardiology

## 2015-10-08 ENCOUNTER — Ambulatory Visit (INDEPENDENT_AMBULATORY_CARE_PROVIDER_SITE_OTHER): Payer: 59 | Admitting: *Deleted

## 2015-10-08 DIAGNOSIS — R0600 Dyspnea, unspecified: Secondary | ICD-10-CM | POA: Diagnosis not present

## 2015-10-08 DIAGNOSIS — I482 Chronic atrial fibrillation, unspecified: Secondary | ICD-10-CM

## 2015-10-08 DIAGNOSIS — I4821 Permanent atrial fibrillation: Secondary | ICD-10-CM

## 2015-10-08 LAB — POCT INR: INR: 3

## 2015-10-08 NOTE — Progress Notes (Signed)
Daily Session Note  Patient Details  Name: Joseph [name] MRN: [id number] Date of Birth: 08/16/1955 Referring Provider:        Pulmonary Rehab Walk Test from 08/06/2015 in [facility]   Referring Provider  Dr. McLean      Encounter Date: 10/08/2015  Check In:     Session Check In - 10/08/15 1248    Check-In   Location [facility]   Staff Present  , MS, ACSM RCEP, Exercise Physiologist;Annedrea Stackhouse, RN, MHA;Joan Behrens, RN, BSN   Supervising physician immediately available to respond to emergencies [facility] immediately available   Physician(s) Dr. Merrell   Medication changes reported     No   Fall or balance concerns reported    No   Warm-up and Cool-down Performed as group-led instruction   Resistance Training Performed Yes   VAD Patient? No   Pain Assessment   Currently in Pain? No/denies   Multiple Pain Sites No      Capillary Blood Glucose: No results found for this or any previous visit (from the past 24 hour(s)).     POCT Glucose - 10/08/15 1249    POCT Blood Glucose   Pre-Exercise 137 mg/dL   Post-Exercise 121 mg/dL         Exercise Prescription Changes - 10/08/15 1200    Response to Exercise   Blood Pressure (Admit) 102/60 mmHg   Blood Pressure (Exercise) 112/66 mmHg   Blood Pressure (Exit) 122/74 mmHg   Heart Rate (Admit) 66 bpm   Heart Rate (Exercise) 71 bpm   Heart Rate (Exit) 64 bpm   Oxygen Saturation (Admit) 100 %   Oxygen Saturation (Exercise) 93 %   Oxygen Saturation (Exit) 99 %   Rating of Perceived Exertion (Exercise) 13   Perceived Dyspnea (Exercise) 0   Comments reviewed home exercise with patient   Duration Progress to 45 minutes of aerobic exercise without signs/symptoms of physical distress   Intensity THRR unchanged   Progression   Progression Continue to progress workloads to maintain intensity without signs/symptoms of physical distress.   Resistance  Training   Training Prescription Yes   Weight blue bands   Reps 10-12   Interval Training   Interval Training No   Oxygen   Oxygen Continuous   Liters 3   Bike   Level 0.8   Minutes 15   NuStep   Level 3   Minutes 15   METs 2.9   Track   Laps 8   Minutes 15     Goals Met:  Exercise tolerated well No report of cardiac concerns or symptoms Strength training completed today  Goals Unmet:  Not Applicable  Comments: Service time is from 10:30am to 12:15pm    Dr. Wesam G. Yacoub is Medical Director for Pulmonary Rehab at [facility]. 

## 2015-10-10 ENCOUNTER — Encounter (HOSPITAL_COMMUNITY)
Admission: RE | Admit: 2015-10-10 | Discharge: 2015-10-10 | Disposition: A | Discharge status: Home / Self Care | Payer: 59 | Source: Ambulatory Visit | Attending: Cardiology | Admitting: Cardiology

## 2015-10-10 DIAGNOSIS — R0600 Dyspnea, unspecified: Secondary | ICD-10-CM | POA: Diagnosis not present

## 2015-10-15 ENCOUNTER — Ambulatory Visit (INDEPENDENT_AMBULATORY_CARE_PROVIDER_SITE_OTHER): Payer: 59 | Admitting: *Deleted

## 2015-10-15 ENCOUNTER — Encounter (HOSPITAL_COMMUNITY): Payer: 59

## 2015-10-15 ENCOUNTER — Ambulatory Visit (HOSPITAL_COMMUNITY)
Admission: RE | Admit: 2015-10-15 | Discharge: 2015-10-15 | Disposition: A | Discharge status: Home / Self Care | Payer: 59 | Source: Ambulatory Visit | Attending: Cardiology | Admitting: Cardiology

## 2015-10-15 ENCOUNTER — Encounter (HOSPITAL_COMMUNITY): Payer: Self-pay

## 2015-10-15 VITALS — BP 117/73 | HR 68 | Ht 72.0 in | Wt 198.0 lb

## 2015-10-15 DIAGNOSIS — F1021 Alcohol dependence, in remission: Secondary | ICD-10-CM | POA: Diagnosis not present

## 2015-10-15 DIAGNOSIS — I272 Other secondary pulmonary hypertension: Secondary | ICD-10-CM | POA: Insufficient documentation

## 2015-10-15 DIAGNOSIS — J9611 Chronic respiratory failure with hypoxia: Secondary | ICD-10-CM | POA: Insufficient documentation

## 2015-10-15 DIAGNOSIS — Z9981 Dependence on supplemental oxygen: Secondary | ICD-10-CM | POA: Insufficient documentation

## 2015-10-15 DIAGNOSIS — Z7901 Long term (current) use of anticoagulants: Secondary | ICD-10-CM | POA: Diagnosis not present

## 2015-10-15 DIAGNOSIS — I4891 Unspecified atrial fibrillation: Secondary | ICD-10-CM

## 2015-10-15 DIAGNOSIS — I132 Hypertensive heart and chronic kidney disease with heart failure and with stage 5 chronic kidney disease, or end stage renal disease: Secondary | ICD-10-CM | POA: Insufficient documentation

## 2015-10-15 DIAGNOSIS — E1122 Type 2 diabetes mellitus with diabetic chronic kidney disease: Secondary | ICD-10-CM | POA: Insufficient documentation

## 2015-10-15 DIAGNOSIS — Z794 Long term (current) use of insulin: Secondary | ICD-10-CM | POA: Diagnosis not present

## 2015-10-15 DIAGNOSIS — I482 Chronic atrial fibrillation, unspecified: Secondary | ICD-10-CM

## 2015-10-15 DIAGNOSIS — Z8249 Family history of ischemic heart disease and other diseases of the circulatory system: Secondary | ICD-10-CM | POA: Diagnosis not present

## 2015-10-15 DIAGNOSIS — Z992 Dependence on renal dialysis: Secondary | ICD-10-CM | POA: Diagnosis not present

## 2015-10-15 DIAGNOSIS — Z87891 Personal history of nicotine dependence: Secondary | ICD-10-CM | POA: Diagnosis not present

## 2015-10-15 DIAGNOSIS — I5032 Chronic diastolic (congestive) heart failure: Secondary | ICD-10-CM | POA: Insufficient documentation

## 2015-10-15 DIAGNOSIS — I4821 Permanent atrial fibrillation: Secondary | ICD-10-CM

## 2015-10-15 DIAGNOSIS — Z841 Family history of disorders of kidney and ureter: Secondary | ICD-10-CM | POA: Insufficient documentation

## 2015-10-15 DIAGNOSIS — N186 End stage renal disease: Secondary | ICD-10-CM | POA: Diagnosis not present

## 2015-10-15 DIAGNOSIS — I480 Paroxysmal atrial fibrillation: Secondary | ICD-10-CM | POA: Insufficient documentation

## 2015-10-15 LAB — CBC
HCT: 33.6 % — ABNORMAL LOW (ref 39.0–52.0)
HEMOGLOBIN: 10.1 g/dL — AB (ref 13.0–17.0)
MCH: 26.9 pg (ref 26.0–34.0)
MCHC: 30.1 g/dL (ref 30.0–36.0)
MCV: 89.6 fL (ref 78.0–100.0)
PLATELETS: 123 10*3/uL — AB (ref 150–400)
RBC: 3.75 MIL/uL — ABNORMAL LOW (ref 4.22–5.81)
RDW: 20.8 % — ABNORMAL HIGH (ref 11.5–15.5)
WBC: 5.7 10*3/uL (ref 4.0–10.5)

## 2015-10-15 LAB — COMPREHENSIVE METABOLIC PANEL
ALBUMIN: 3.4 g/dL — AB (ref 3.5–5.0)
ALT: 44 U/L (ref 17–63)
AST: 61 U/L — AB (ref 15–41)
Alkaline Phosphatase: 204 U/L — ABNORMAL HIGH (ref 38–126)
Anion gap: 12 (ref 5–15)
BUN: 61 mg/dL — AB (ref 6–20)
CALCIUM: 7.5 mg/dL — AB (ref 8.9–10.3)
CO2: 24 mmol/L (ref 22–32)
Chloride: 98 mmol/L — ABNORMAL LOW (ref 101–111)
Creatinine, Ser: 9.04 mg/dL — ABNORMAL HIGH (ref 0.61–1.24)
GFR calc Af Amer: 6 mL/min — ABNORMAL LOW (ref >60)
GFR, EST NON AFRICAN AMERICAN: 6 mL/min — AB (ref >60)
Glucose, Bld: 187 mg/dL — ABNORMAL HIGH (ref 65–99)
POTASSIUM: 4.1 mmol/L (ref 3.5–5.1)
SODIUM: 134 mmol/L — AB (ref 135–145)
TOTAL PROTEIN: 5.9 g/dL — AB (ref 6.5–8.1)
Total Bilirubin: 1.5 mg/dL — ABNORMAL HIGH (ref 0.3–1.2)

## 2015-10-15 LAB — POCT INR: INR: 3.4

## 2015-10-15 LAB — TSH: TSH: 3.493 u[IU]/mL (ref 0.350–4.500)

## 2015-10-15 NOTE — Patient Instructions (Signed)
Routine lab work today. Will notify you of abnormal results, otherwise no news is good news!  Follow up 6 months with Dr. McLean.  Do the following things EVERYDAY: 1) Weigh yourself in the morning before breakfast. Write it down and keep it in a log. 2) Take your medicines as prescribed 3) Eat low salt foods-Limit salt (sodium) to 2000 mg per day.  4) Stay as active as you can everyday 5) Limit all fluids for the day to less than 2 liters  

## 2015-10-15 NOTE — Progress Notes (Signed)
ADVANCED HF CLINIC NOTE  Patient ID: Joseph [name], male   DOB: Sep 11, 1955, 60 y.o.   MRN: [id number] Referring MD: Dr. Caryl Comes Nephrology: Dr Joelyn Oms CHF: Aundra Dubin  60 yo with history of ESRD, paroxysmal atrial fibrillation, and chronic diastolic CHF was referred to CHF clinic for evaluation of CHF and elevated PA pressure on echo.  Patient has had renal failure, probably due to HTN and diabetes.  He follows with Dr. Florene Glen.  He has been in atrial fibrillation since 6/13 when he failed a cardioversion and it was decided to leave him in atrial fibrillation.  He is now on coumadin.  Echo in 4/15 showed EF 0000000 with PA systolic pressure 85 mmHg.   RHC in 6/15 showing elevated right and left heart filling pressures with moderate pulmonary hypertension, primarily pulmonary venous hypertension.  He has been seen at [facility] for possible Renal transplant but told he is not a candidate due to high PA pressures.   He was admitted in 11/15 with acute on chronic diastolic CHF and AKI on CKD. He was started on oxygen.  He was started on hemodialysis for volume removal at that time.    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 Went to [facility], discharged home on 9/17.   He remains on home oxygen.  PFTs in 12/16 showed moderate-severe obstructive disease with concurrent restriction and decreased DLCO.  Echo in 12/6 showed EF 50-55%, PASP 38 mmHg with mildly decreased RV systolic function.  RHC was done in 1/17, showing mild pulmonary hypertension.  High resolution CT was not significantly abnormal.  He saw Dr Lake Bells with pulmonary and was thought to possibly have a post-infectious respiratory bronchiolitis causing his oxygen requirement.  Today he is in NSR and denies tachypalpitations.  He has been doing pulmonary rehab.  He is short of breath with stairs/inclines  but does ok on flat ground as long as he wears oxygen.  No orthopnea/PND.  No chest pain.   ECG: NSR, inferior and anterolateral T wave inversions.  Labs (8/14): K 4.4, creatinine 3.7 Labs (6/15): K 4.5, creatinine 4.88 Labs (10/30/13) : K 4.1 Creatinine 4.22 Labs (01/15/14): K 3.8 creatinine 5.57 Labs (12/15): HCT 30.9 Labs (12/16): TSH normal Labs (1/17): LFTs normal  PMH: 1. CKD: Followed by Dr. Florene Glen, likely due to HTN and diabetes. Progression to ESRD in 11/15.  2. Atrial fibrillation: Paroxysmal.  DCCV attempted in 6/13 but reverted to atrial fibrillation.  Now rhythm control with amiodarone. 3. H/o heavy ETOH, now does not drink. 4. Type II diabetes 5. HTN 6. Chronic diastolic CHF with elevated PA pressure: Echo (4/15) with EF 55-60%, mild MR, PA systolic pressure 85 mmHg, RV normal per report. RHC (6/15) with mean RA 12, PA 68/28 mean 44, mean PCWP 23 with prominent v-waves, CI 3.5, PVR 2.8 WU. Echo (12/16) with EF 50-55%, moderate LVH, normal RV size with mildly decreased systolic function.  PASP 38 mmHg.  RHC (1/17) with mean RA 9, PA 42/22 mean 30, mean PCWP 14, CI 3, PVR 2.6 WU.  7. Chronic hypoxic respiratory failure: He has been on home oxygen since hospitalization with PNA.   - PFTs (12/16) with mod-severe obstructive disease suggestive of emphysema but also concurrent restriction and decreased DLCO (30%).    - High resolution CT (1/17): Not significantly abnormal.  - Suspect post-infectious respiratory bronchiolitis.  8. Pulmonary hypertension: RHC (1/17)  with mean RA 9, PA 42/22 mean 30, mean PCWP 14, CI 3, PVR 2.6 WU.  Mild, probably related to underlying lung disease (Group 3).   - V/Q scan (2/17) with no evidence for chronic PE.   SH: On disability, prior smoker but quit (he was never a heavy smoker), prior heavy ETOH but quit, lives in [place].  FH: Mother with valve replacement, sister with ESRD.   ROS: All systems reviewed and negative except as per HPI.    Current Outpatient Prescriptions  Medication Sig Dispense Refill  . acetaminophen (TYLENOL) 500 MG tablet Take 1,000 mg by mouth every 6 (six) hours as needed (pain). Reported on 05/09/2015    . amiodarone (PACERONE) 200 MG tablet TAKE 1 TABLET (200 MG TOTAL) BY MOUTH DAILY. 30 tablet 6  . amLODipine (NORVASC) 5 MG tablet Take 1 tablet (5 mg total) by mouth daily. 30 tablet 3  . atorvastatin (LIPITOR) 40 MG tablet Take 1 tablet (40 mg total) by mouth at bedtime. 30 tablet 1  . B Complex-C-Folic Acid (VOL-CARE RX) 1 MG TABS Take 1 tablet by mouth at bedtime.  11  . B-D ULTRAFINE III SHORT PEN 31G X 8 MM MISC USE AS DIRECTED ONCE A DAY  11  . budesonide-formoterol (SYMBICORT) 80-4.5 MCG/ACT inhaler Inhale 2 puffs into the lungs 2 (two) times daily. 1 Inhaler 5  . calcitRIOL (ROCALTROL) 0.25 MCG capsule Take 7 capsules (1.75 mcg total) by mouth every Monday, Wednesday, and Friday with hemodialysis.    Marland Kitchen colchicine 0.6 MG tablet Take 0.6 mg by mouth 2 (two) times daily.    Marland Kitchen ethyl chloride spray USE AS DIRECTED FOR DIALYSIS  3  . insulin glargine (LANTUS) 100 unit/mL SOPN Inject 17-20 Units into the skin daily before supper.     Marland Kitchen ketorolac (ACULAR) 0.5 % ophthalmic solution Place 1 drop into the right eye See admin instructions. Instill 1 drop into right eye 4 times daily starting 72 hours prior to surgery  4  . lamoTRIgine (LAMICTAL) 100 MG tablet Take 1 tablet (100 mg total) by mouth 2 (two) times daily. 60 tablet 11  . loperamide (IMODIUM) 1 MG/5ML solution Take 6 mg by mouth 2 (two) times daily as needed for diarrhea or loose stools.    . metoprolol tartrate (LOPRESSOR) 25 MG tablet Take 25 mg by mouth as directed. Take 1 tablet (25 mg) twice daily on Monday, Wednesday, and Friday (Dialysis days) and take 2 tablets (50 mg) twice daily on Sunday, Tuesday, Thursday, Saturday    . sevelamer carbonate (RENVELA) 800 MG tablet Take 800-2,400 mg by mouth See admin instructions. Take 3 tablets (2400 mg)  by mouth with meals and 1 tablet (800 mg) with snacks - total of 8 tablets daily    . warfarin (COUMADIN) 3 MG tablet Take as directed by coumadin clinic 65 tablet 3   No current facility-administered medications for this encounter.    BP 117/73 mmHg  Pulse 68  Ht 6' (1.829 m)  Wt 198 lb (89.812 kg)  BMI 26.85 kg/m2  SpO2 95% General: NAD  Neck: Supple, JVP 10-12 cm.  No thyromegaly or thyroid nodule.  Lungs: CTAB Cardiac: PMI normal.  Regular S1S2, no S3S4. 2/6 TR murmur, trace edema.  No carotid bruit.  Normal pedal pulses.  Abdomen: Soft, nontender, no hepatosplenomegaly, nondistended.  Skin: Intact without lesions or rashes.  Neurologic: Alert and oriented x 3.  Psych: Normal affect. Extremities: No clubbing or cyanosis. L wrist fistula  Assessment/Plan: 1.  Chronic diastolic CHF: On exam, he looks volume overloaded.  Will forward this note to Dr Joelyn Oms to see if more fluid can be taken off with HD. 2. ESRD: MWF HD.  3. Atrial fibrillation: Remains in NSR on amiodarone.    - Continue warfarin. - Continue amiodarone 200 mg daily.  He had a pulmonary evaluation, and given significant clearance of chest CT since his PNA in the fall, Dr Lake Bells thought amiodarone lung toxicity was unlikely. Check LFTs and TSH today.  He will need regular eye exams while on amiodarone.  - Continue current metoprolol.   4. Pulmonary HTN: Primarily pulmonary venous hypertension on 6/15 RHC.  With dialysis, this improved.  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 V/Q scan did not show chronic PE.  He was turned down for renal transplant due to pulmonary hypertension.  I do not think this would be a limiting factor at this point.  - He is trying to contact [facility] to set up transplant evaluation.  5. Chronic hypoxemic respiratory failure:  He saw pulmonary (Dr Lake Bells) and was thought to have post-infectious respiratory bronchiolitis as the primary cause of  hypoxemia. Amiodarone toxicity thought unlikely.  Currently, he is also volume overloaded on exam and would like for more fluid to come off with HD.   Followup in 6 months.   [name] 10/15/2015

## 2015-10-17 ENCOUNTER — Encounter (HOSPITAL_COMMUNITY)
Admission: RE | Admit: 2015-10-17 | Discharge: 2015-10-17 | Disposition: A | Discharge status: Home / Self Care | Payer: 59 | Source: Ambulatory Visit | Attending: Cardiology | Admitting: Cardiology

## 2015-10-17 VITALS — Wt 196.4 lb

## 2015-10-17 DIAGNOSIS — R0600 Dyspnea, unspecified: Secondary | ICD-10-CM

## 2015-10-17 NOTE — Progress Notes (Signed)
Daily Session Note  Patient Details  Name: Joseph [name] MRN: [id number] Date of Birth: [birth year] Referring Provider:        Pulmonary Rehab Walk Test from 08/06/2015 in [place]   Referring Provider  Dr. Aundra Dubin      Encounter Date: 10/17/2015  Check In:     Session Check In - 10/17/15 1105    Check-In   Location [facility]   Staff Present Rosebud Poles, RN, Luisa Hart, RN, BSN;Shea Kapur, RN;Molly diVincenzo, MS, ACSM RCEP, Exercise Physiologist   Supervising physician immediately available to respond to emergencies [facility] immediately available   Physician(s) Dr. Waldron Labs   Medication changes reported     No   Fall or balance concerns reported    No   Warm-up and Cool-down Performed as group-led instruction   Resistance Training Performed Yes   VAD Patient? No   Pain Assessment   Currently in Pain? No/denies   Multiple Pain Sites No      Capillary Blood Glucose: No results found for this or any previous visit (from the past 24 hour(s)).     POCT Glucose - 10/17/15 1556    POCT Blood Glucose   Pre-Exercise 97 mg/dL   Post-Exercise 133 mg/dL         Exercise Prescription Changes - 10/17/15 1500    Response to Exercise   Blood Pressure (Admit) 139/81 mmHg   Blood Pressure (Exercise) 156/90 mmHg   Blood Pressure (Exit) 122/70 mmHg   Heart Rate (Admit) 70 bpm   Heart Rate (Exercise) 87 bpm   Heart Rate (Exit) 66 bpm   Oxygen Saturation (Admit) 100 %   Oxygen Saturation (Exercise) 93 %   Oxygen Saturation (Exit) 66 %   Rating of Perceived Exertion (Exercise) 12   Perceived Dyspnea (Exercise) 1   Duration Progress to 45 minutes of aerobic exercise without signs/symptoms of physical distress   Intensity THRR unchanged   Progression   Progression Continue to progress workloads to maintain intensity without signs/symptoms of physical distress.   Resistance Training   Training Prescription Yes   Weight blue bands   Reps 10-12   Interval Training   Interval Training No   Oxygen   Oxygen Continuous   Liters 3   Bike   Level 0.8   Minutes 17   Track   Laps 7   Minutes 17     Goals Met:  Exercise tolerated well No report of cardiac concerns or symptoms Strength training completed today  Goals Unmet:  Not Applicable  Comments: Service time is from 1030 to 1215    Dr. Rush Farmer is Medical Director for Pulmonary Rehab at [facility].

## 2015-10-17 NOTE — Progress Notes (Signed)
Daily Session Note  Patient Details  Name: Joseph [name] MRN: [id number] Date of Birth: 17-Jan-1956 Referring Provider:        Pulmonary Rehab Walk Test from 08/06/2015 in [place]   Referring Provider  Dr. Aundra Dubin      Encounter Date: 10/17/2015  Check In:     Session Check In - 10/17/15 1105    Check-In   Location [facility]   Staff Present Rosebud Poles, RN, Luisa Hart, RN, BSN;Alyannah Sanks, RN;Molly diVincenzo, MS, ACSM RCEP, Exercise Physiologist   Supervising physician immediately available to respond to emergencies [facility] immediately available   Physician(s) Dr. Waldron Labs   Medication changes reported     No   Fall or balance concerns reported    No   Warm-up and Cool-down Performed as group-led instruction   Resistance Training Performed Yes   VAD Patient? No   Pain Assessment   Currently in Pain? No/denies   Multiple Pain Sites No      Capillary Blood Glucose: No results found for this or any previous visit (from the past 24 hour(s)).     POCT Glucose - 10/17/15 1556    POCT Blood Glucose   Pre-Exercise 97 mg/dL   Post-Exercise 133 mg/dL         Exercise Prescription Changes - 10/17/15 1500    Response to Exercise   Blood Pressure (Admit) 139/81 mmHg   Blood Pressure (Exercise) 156/90 mmHg   Blood Pressure (Exit) 122/70 mmHg   Heart Rate (Admit) 70 bpm   Heart Rate (Exercise) 87 bpm   Heart Rate (Exit) 66 bpm   Oxygen Saturation (Admit) 100 %   Oxygen Saturation (Exercise) 93 %   Oxygen Saturation (Exit) 66 %   Rating of Perceived Exertion (Exercise) 12   Perceived Dyspnea (Exercise) 1   Duration Progress to 45 minutes of aerobic exercise without signs/symptoms of physical distress   Intensity THRR unchanged   Progression   Progression Continue to progress workloads to maintain intensity without signs/symptoms of physical distress.   Resistance Training   Training Prescription Yes   Weight blue bands   Reps 10-12   Interval Training   Interval Training No   Oxygen   Oxygen Continuous   Liters 3   Bike   Level 0.8   Minutes 17   Track   Laps 7   Minutes 17     Goals Met:  Exercise tolerated well No report of cardiac concerns or symptoms Strength training completed today  Goals Unmet:  Not Applicable  Comments: Service time is from 1030 to 1245    Dr. Rush Farmer is Medical Director for Pulmonary Rehab at [facility]

## 2015-10-22 ENCOUNTER — Encounter (HOSPITAL_COMMUNITY): Payer: 59

## 2015-10-24 ENCOUNTER — Encounter (HOSPITAL_COMMUNITY)
Admission: RE | Admit: 2015-10-24 | Discharge: 2015-10-24 | Disposition: A | Discharge status: Home / Self Care | Payer: 59 | Source: Ambulatory Visit | Attending: Cardiology | Admitting: Cardiology

## 2015-10-24 ENCOUNTER — Ambulatory Visit (INDEPENDENT_AMBULATORY_CARE_PROVIDER_SITE_OTHER): Payer: 59 | Admitting: Neurology

## 2015-10-24 ENCOUNTER — Ambulatory Visit (INDEPENDENT_AMBULATORY_CARE_PROVIDER_SITE_OTHER): Payer: 59 | Admitting: *Deleted

## 2015-10-24 DIAGNOSIS — R0600 Dyspnea, unspecified: Secondary | ICD-10-CM

## 2015-10-24 DIAGNOSIS — R569 Unspecified convulsions: Secondary | ICD-10-CM

## 2015-10-24 DIAGNOSIS — I482 Chronic atrial fibrillation, unspecified: Secondary | ICD-10-CM

## 2015-10-24 DIAGNOSIS — I4821 Permanent atrial fibrillation: Secondary | ICD-10-CM

## 2015-10-24 DIAGNOSIS — Z79899 Other long term (current) drug therapy: Secondary | ICD-10-CM | POA: Insufficient documentation

## 2015-10-24 DIAGNOSIS — Z7901 Long term (current) use of anticoagulants: Secondary | ICD-10-CM | POA: Insufficient documentation

## 2015-10-24 DIAGNOSIS — Z87891 Personal history of nicotine dependence: Secondary | ICD-10-CM | POA: Insufficient documentation

## 2015-10-24 DIAGNOSIS — N186 End stage renal disease: Secondary | ICD-10-CM | POA: Insufficient documentation

## 2015-10-24 DIAGNOSIS — G4730 Sleep apnea, unspecified: Secondary | ICD-10-CM | POA: Insufficient documentation

## 2015-10-24 DIAGNOSIS — Z794 Long term (current) use of insulin: Secondary | ICD-10-CM | POA: Insufficient documentation

## 2015-10-24 DIAGNOSIS — E1122 Type 2 diabetes mellitus with diabetic chronic kidney disease: Secondary | ICD-10-CM | POA: Insufficient documentation

## 2015-10-24 DIAGNOSIS — Z992 Dependence on renal dialysis: Secondary | ICD-10-CM | POA: Insufficient documentation

## 2015-10-24 DIAGNOSIS — E1142 Type 2 diabetes mellitus with diabetic polyneuropathy: Secondary | ICD-10-CM | POA: Insufficient documentation

## 2015-10-24 DIAGNOSIS — I120 Hypertensive chronic kidney disease with stage 5 chronic kidney disease or end stage renal disease: Secondary | ICD-10-CM | POA: Insufficient documentation

## 2015-10-24 LAB — POCT INR: INR: 2.5

## 2015-10-24 NOTE — Progress Notes (Signed)
Joseph [name] had a CBG of 323 and was not allowed to do his discharge 6 minute walk test.  He was instructed to drink water, and minimize his carbohydrate and sugar intake today.  He woke up with a CBG of 85, he ate some banana pudding and this was the reason his CBG is elevated.

## 2015-10-25 NOTE — Procedures (Signed)
   HISTORY: 60 years old male, presented with seizure in October 2016  TECHNIQUE:  16 channel EEG was performed based on standard 10-16 international system. One channel was dedicated to EKG, which has demonstrates irregular cardiac rhythm of 114 beats per minutes.  Upon awakening, the posterior background activity was well-developed, in alpha range, 9 Hz, reactive to eye opening and closure.  There was no evidence of epileptiform discharge, there is frequent muscle artifact.  Photic stimulation was performed, which induced a symmetric photic driving.  Hyperventilation was not performed, there was no abnormality elicit.  No sleep was achieved.  CONCLUSION: This is a  normal awake EEG.  There is no electrodiagnostic evidence of epileptiform discharge.  Marcial Pacas, M.D. Ph.D.  [facility] [place], [place] [address] Phone: [phone] Fax:      [phone]

## 2015-10-29 ENCOUNTER — Encounter (HOSPITAL_COMMUNITY)
Admission: RE | Admit: 2015-10-29 | Discharge: 2015-10-29 | Disposition: A | Discharge status: Home / Self Care | Payer: 59 | Source: Ambulatory Visit | Attending: Cardiology | Admitting: Cardiology

## 2015-10-31 ENCOUNTER — Encounter (HOSPITAL_COMMUNITY): Payer: Self-pay

## 2015-10-31 ENCOUNTER — Encounter (HOSPITAL_COMMUNITY): Payer: 59

## 2015-10-31 DIAGNOSIS — R0600 Dyspnea, unspecified: Secondary | ICD-10-CM

## 2015-10-31 NOTE — Progress Notes (Signed)
Pulmonary Individual Treatment Plan  Patient Details  Name: Joseph [name] MRN: [id number] Date of Birth: January 11, 1956 Referring Provider:        Pulmonary Rehab Walk Test from 08/06/2015 in [place]   Referring Provider  Dr. Aundra Dubin      Initial Encounter Date:       Pulmonary Rehab Walk Test from 08/06/2015 in [place]   Date  08/06/15   Referring Provider  Dr. Aundra Dubin      Visit Diagnosis: Dyspnea  Patient's Home Medications on Admission:   Current outpatient prescriptions:  .  acetaminophen (TYLENOL) 500 MG tablet, Take 1,000 mg by mouth every 6 (six) hours as needed (pain). Reported on 05/09/2015, Disp: , Rfl:  .  amiodarone (PACERONE) 200 MG tablet, TAKE 1 TABLET (200 MG TOTAL) BY MOUTH DAILY., Disp: 30 tablet, Rfl: 6 .  amLODipine (NORVASC) 5 MG tablet, Take 1 tablet (5 mg total) by mouth daily., Disp: 30 tablet, Rfl: 3 .  atorvastatin (LIPITOR) 40 MG tablet, Take 1 tablet (40 mg total) by mouth at bedtime., Disp: 30 tablet, Rfl: 1 .  B Complex-C-Folic Acid (VOL-CARE RX) 1 MG TABS, Take 1 tablet by mouth at bedtime., Disp: , Rfl: 11 .  B-D ULTRAFINE III SHORT PEN 31G X 8 MM MISC, USE AS DIRECTED ONCE A DAY, Disp: , Rfl: 11 .  budesonide-formoterol (SYMBICORT) 80-4.5 MCG/ACT inhaler, Inhale 2 puffs into the lungs 2 (two) times daily., Disp: 1 Inhaler, Rfl: 5 .  calcitRIOL (ROCALTROL) 0.25 MCG capsule, Take 7 capsules (1.75 mcg total) by mouth every Monday, Wednesday, and Friday with hemodialysis., Disp: , Rfl:  .  colchicine 0.6 MG tablet, Take 0.6 mg by mouth 2 (two) times daily., Disp: , Rfl:  .  ethyl chloride spray, USE AS DIRECTED FOR DIALYSIS, Disp: , Rfl: 3 .  insulin glargine (LANTUS) 100 unit/mL SOPN, Inject 17-20 Units into the skin daily before supper. , Disp: , Rfl:  .  ketorolac (ACULAR) 0.5 % ophthalmic solution, Place 1 drop into the right eye See admin instructions. Instill 1 drop into right eye 4 times  daily starting 72 hours prior to surgery, Disp: , Rfl: 4 .  lamoTRIgine (LAMICTAL) 100 MG tablet, Take 1 tablet (100 mg total) by mouth 2 (two) times daily., Disp: 60 tablet, Rfl: 11 .  loperamide (IMODIUM) 1 MG/5ML solution, Take 6 mg by mouth 2 (two) times daily as needed for diarrhea or loose stools., Disp: , Rfl:  .  metoprolol tartrate (LOPRESSOR) 25 MG tablet, Take 25 mg by mouth as directed. Take 1 tablet (25 mg) twice daily on Monday, Wednesday, and Friday (Dialysis days) and take 2 tablets (50 mg) twice daily on Sunday, Tuesday, Thursday, Saturday, Disp: , Rfl:  .  sevelamer carbonate (RENVELA) 800 MG tablet, Take 800-2,400 mg by mouth See admin instructions. Take 3 tablets (2400 mg) by mouth with meals and 1 tablet (800 mg) with snacks - total of 8 tablets daily, Disp: , Rfl:  .  warfarin (COUMADIN) 3 MG tablet, Take as directed by coumadin clinic, Disp: 65 tablet, Rfl: 3  Past Medical History: Past Medical History  Diagnosis Date  . Atrial fibrillation ([place])     a. 11/18/2011 s/p DCCV - 150J;  b. Anticoagulation w/ Apixaban;  c. 11/2011 back in afib->asymptomatic.  . H/O alcohol abuse     a. drinks heavily on the weekends.  . Hypertension   . Diabetes mellitus ([place])   .  Heart murmur     a. 09/2011 Echo: EF 55-60%, Triv AI, Mild MR, mildly dil LA.  . Diabetic peripheral neuropathy ([place])   . CKD (chronic kidney disease), stage III   . CHF (congestive heart failure) ([place])   . Pneumonia   . Sleep apnea   . Seizure ([place])   . 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 Former Smoker -- 0.01 packs/day for 5 years  . Types: Cigarettes  . Quit date: 04/20/1994  Smokeless tobacco  . Never Used    Labs: Recent Review Flowsheet Data    Labs for ITP Cardiac and Pulmonary Rehab Latest Ref Rng 12/19/2014 12/20/2014 12/21/2014 12/22/2014 04/25/2015   HCO3 20.0 - 24.0 mEq/L - - - - 32.6(H)   TCO2 0 - 100 mmol/L 25 22 23  26  34   O2SAT - - - - - 67.0      Capillary Blood Glucose: Lab Results  Component Value Date   GLUCAP 159* 09/03/2015   GLUCAP 314* 08/22/2015   GLUCAP 162* 08/13/2015   GLUCAP 216* 08/13/2015   GLUCAP 118* 08/02/2015       POCT Glucose      09/03/15 1206 09/10/15 1219 09/17/15 1205 10/01/15 1224 10/03/15 1229   POCT Blood Glucose   Pre-Exercise 145 mg/dL 207 mg/dL 239 mg/dL 225 mg/dL 141 mg/dL   Post-Exercise 159 mg/dL 181 mg/dL 233 mg/dL 204 mg/dL 121 mg/dL     10/08/15 1249 10/17/15 1556         POCT Blood Glucose   Pre-Exercise 137 mg/dL 97 mg/dL      Post-Exercise 121 mg/dL 133 mg/dL         ADL UCSD:   Pulmonary Function Assessment:   Exercise Target Goals:    Exercise Program Goal: Individual exercise prescription set with THRR, safety & activity barriers. Participant demonstrates ability to understand and report RPE using BORG scale, to self-measure pulse accurately, and to acknowledge the importance of the exercise prescription.  Exercise Prescription Goal: Starting with aerobic activity 30 plus minutes a day, 3 days per week for initial exercise prescription. Provide home exercise prescription and guidelines that participant acknowledges understanding prior to discharge.  Activity Barriers & Risk Stratification:   6 Minute Walk:     6 Minute Walk      09/19/15 1424       6 Minute Walk   Phase Mid Program     Distance 750 feet     Walk Time 5 minutes     # of Rest Breaks 1     RPE 13     Perceived Dyspnea  1     Symptoms No     Resting HR 98 bpm     Resting BP 104/60 mmHg     Max Ex. HR 91 bpm     Max Ex. BP 104/60 mmHg     Interval HR   Baseline HR 98     1 Minute HR 81     2 Minute HR 91     3 Minute HR 84     4 Minute HR 84     5 Minute HR 84     6 Minute HR 86     Interval Oxygen   Interval Oxygen? Yes     Baseline Oxygen Saturation % 92 %     Baseline Liters of Oxygen 0 L  1 Minute Oxygen Saturation % 92 %     1 Minute  Liters of Oxygen 0 L     2 Minute Oxygen Saturation % 86 %     2 Minute Liters of Oxygen 0 L     3 Minute Oxygen Saturation % 87 %     3 Minute Liters of Oxygen 2 L     4 Minute Oxygen Saturation % 88 %     4 Minute Liters of Oxygen 2 L     5 Minute Oxygen Saturation % 91 %     5 Minute Liters of Oxygen 2 L     6 Minute Oxygen Saturation % 95 %     6 Minute Liters of Oxygen 2 L     2 Minute Post Oxygen Saturation % 92 %     2 Minute Post Liters of Oxygen 2 L        Initial Exercise Prescription:   Perform Capillary Blood Glucose checks as needed.  Exercise Prescription Changes:     Exercise Prescription Changes      09/03/15 1200 09/05/15 1400 09/10/15 1200 09/12/15 1200 09/17/15 1200   Exercise Review   Progression     Yes   Response to Exercise   Blood Pressure (Admit) 120/74 mmHg 125/83 mmHg 118/56 mmHg 100/54 mmHg 136/88 mmHg   Blood Pressure (Exercise) 120/80 mmHg 110/79 mmHg 128/85 mmHg 131/68 mmHg 126/80 mmHg   Blood Pressure (Exit) 125/76 mmHg 139/75 mmHg 132/84 mmHg 151/86 mmHg 130/74 mmHg   Heart Rate (Admit) 72 bpm 75 bpm 70 bpm 67 bpm 73 bpm   Heart Rate (Exercise) 82 bpm 88 bpm 85 bpm 108 bpm 79 bpm   Heart Rate (Exit) 76 bpm 87 bpm 73 bpm 75 bpm 65 bpm   Oxygen Saturation (Admit) 100 % 100 % 95 % 99 % 99 %   Oxygen Saturation (Exercise) 88 % 93 % 88 % 88 % 93 %   Oxygen Saturation (Exit) 96 % 99 % 99 % 99 % 100 %   Rating of Perceived Exertion (Exercise) 13 11 11 11 11    Perceived Dyspnea (Exercise) 2 1 1 1 1    Duration Progress to 45 minutes of aerobic exercise without signs/symptoms of physical distress Progress to 45 minutes of aerobic exercise without signs/symptoms of physical distress Progress to 45 minutes of aerobic exercise without signs/symptoms of physical distress Progress to 45 minutes of aerobic exercise without signs/symptoms of physical distress Progress to 45 minutes of aerobic exercise without signs/symptoms of physical distress   Intensity  THRR unchanged THRR unchanged THRR unchanged THRR unchanged THRR unchanged   Progression   Progression Continue to progress workloads to maintain intensity without signs/symptoms of physical distress. Continue to progress workloads to maintain intensity without signs/symptoms of physical distress. Continue to progress workloads to maintain intensity without signs/symptoms of physical distress. Continue to progress workloads to maintain intensity without signs/symptoms of physical distress. Continue to progress workloads to maintain intensity without signs/symptoms of physical distress.   Resistance Training   Training Prescription Yes No No Yes Yes   Weight blue bands blue bands blue bands blue bands blue bands   Reps 10-12 10-12 10-12 10-12 10-12   Interval Training   Interval Training    No No   Oxygen   Oxygen Continuous Continuous Continuous Continuous Continuous   Liters 3 3 3 3 3    Bike   Level 0.6 0.6 0.6  0.6   Minutes 15 15 15   15  NuStep   Level 3 3 3 3 6    Minutes 15 15 15 15 15    METs 2.2 1.9 2.4 3.1 2.8   Track   Laps 8  9 9 9    Minutes 15   15 15      09/19/15 1300 09/24/15 1200 09/26/15 1200 10/01/15 1200 10/03/15 1200   Exercise Review   Progression   Yes Yes    Response to Exercise   Blood Pressure (Admit) 132/80 mmHg 136/80 mmHg 131/84 mmHg 160/95 mmHg 141/85 mmHg   Blood Pressure (Exercise) 163/93 mmHg 148/83 mmHg 112/73 mmHg 124/82 mmHg 125/78 mmHg   Blood Pressure (Exit) 143/87 mmHg 150/90 mmHg 134/95 mmHg 142/89 mmHg 136/80 mmHg   Heart Rate (Admit) 68 bpm 75 bpm 69 bpm 69 bpm 70 bpm   Heart Rate (Exercise) 87 bpm 83 bpm 75 bpm 76 bpm 74 bpm   Heart Rate (Exit) 67 bpm 71 bpm 69 bpm 68 bpm 67 bpm   Oxygen Saturation (Admit) 94 % 100 % 95 % 97 % 100 %   Oxygen Saturation (Exercise) 89 % 92 % 97 % 91 % 94 %   Oxygen Saturation (Exit) 99 % 98 % 95 % 98 % 95 %   Rating of Perceived Exertion (Exercise) 13 13 13 13 13    Perceived Dyspnea (Exercise) 1 1 1 1 1     Comments    reviewed home exercise with patient reviewed home exercise with patient   Duration Progress to 45 minutes of aerobic exercise without signs/symptoms of physical distress Progress to 45 minutes of aerobic exercise without signs/symptoms of physical distress Progress to 45 minutes of aerobic exercise without signs/symptoms of physical distress Progress to 45 minutes of aerobic exercise without signs/symptoms of physical distress Progress to 45 minutes of aerobic exercise without signs/symptoms of physical distress   Intensity THRR unchanged THRR unchanged THRR unchanged THRR unchanged THRR unchanged   Progression   Progression Continue to progress workloads to maintain intensity without signs/symptoms of physical distress. Continue to progress workloads to maintain intensity without signs/symptoms of physical distress. Continue to progress workloads to maintain intensity without signs/symptoms of physical distress. Continue to progress workloads to maintain intensity without signs/symptoms of physical distress. Continue to progress workloads to maintain intensity without signs/symptoms of physical distress.   Resistance Training   Training Prescription Yes Yes Yes Yes Yes   Weight blue bands blue bands blue bands blue bands blue bands   Reps 10-12 10-12 10-12 10-12 10-12   Interval Training   Interval Training No No No No No   Oxygen   Oxygen Continuous Continuous Continuous Continuous Continuous   Liters 3 3 3 3 3    Bike   Level 0.6 0.6 0.6 0.8 0.8   Minutes 15 15 15 15 15    NuStep   Level  3  3 3    Minutes  15  15 15    METs  2.4   2.6   Track   Laps  9 6 9     Minutes  15 15 15       10/08/15 1200 10/17/15 1500         Response to Exercise   Blood Pressure (Admit) 102/60 mmHg 139/81 mmHg      Blood Pressure (Exercise) 112/66 mmHg 156/90 mmHg      Blood Pressure (Exit) 122/74 mmHg 122/70 mmHg      Heart Rate (Admit) 66 bpm 70 bpm      Heart Rate (Exercise) 71 bpm 87 bpm  Heart Rate (Exit) 64 bpm 66 bpm      Oxygen Saturation (Admit) 100 % 100 %      Oxygen Saturation (Exercise) 93 % 93 %      Oxygen Saturation (Exit) 99 % 66 %      Rating of Perceived Exertion (Exercise) 13 12      Perceived Dyspnea (Exercise) 0 1      Comments reviewed home exercise with patient       Duration Progress to 45 minutes of aerobic exercise without signs/symptoms of physical distress Progress to 45 minutes of aerobic exercise without signs/symptoms of physical distress      Intensity THRR unchanged THRR unchanged      Progression   Progression Continue to progress workloads to maintain intensity without signs/symptoms of physical distress. Continue to progress workloads to maintain intensity without signs/symptoms of physical distress.      Resistance Training   Training Prescription Yes Yes      Weight blue bands blue bands      Reps 10-12 10-12      Interval Training   Interval Training No No      Oxygen   Oxygen Continuous Continuous      Liters 3 3      Bike   Level 0.8 0.8      Minutes 15 17      NuStep   Level 3       Minutes 15       METs 2.9       Track   Laps 8 7      Minutes 15 17         Exercise Comments:     Exercise Comments      09/05/15 0850 10/01/15 1513 10/03/15 0753 10/10/15 1146 10/31/15 1607   Exercise Comments Patient is cont. to progress exercise intensity. Patient is currently walking 10 laps in 15 minutes. Will continue to monitor and progress as appropriate. reviewed home exercise with patient Patient in cont. to increase exercise intensity here and at home. Patient is going to begin walking at home up and down driveway. Patient is open to increasing exercise intensity here at rehab in small amounts due to back pain.  Patient will continue exercising at home and at the [facility]  near his home after graduation. Patient continues to exercise at home. He will complete his discharge 6 min walk test during his next pulmonary rehab session.       Discharge Exercise Prescription (Final Exercise Prescription Changes):     Exercise Prescription Changes - 10/17/15 1500    Response to Exercise   Blood Pressure (Admit) 139/81 mmHg   Blood Pressure (Exercise) 156/90 mmHg   Blood Pressure (Exit) 122/70 mmHg   Heart Rate (Admit) 70 bpm   Heart Rate (Exercise) 87 bpm   Heart Rate (Exit) 66 bpm   Oxygen Saturation (Admit) 100 %   Oxygen Saturation (Exercise) 93 %   Oxygen Saturation (Exit) 66 %   Rating of Perceived Exertion (Exercise) 12   Perceived Dyspnea (Exercise) 1   Duration Progress to 45 minutes of aerobic exercise without signs/symptoms of physical distress   Intensity THRR unchanged   Progression   Progression Continue to progress workloads to maintain intensity without signs/symptoms of physical distress.   Resistance Training   Training Prescription Yes   Weight blue bands   Reps 10-12   Interval Training   Interval Training No   Oxygen   Oxygen Continuous  Liters 3   Bike   Level 0.8   Minutes 17   Track   Laps 7   Minutes 17       Nutrition:  Target Goals: Understanding of nutrition guidelines, daily intake of sodium 1500mg , cholesterol 200mg , calories 30% from fat and 7% or less from saturated fats, daily to have 5 or more servings of fruits and vegetables.  Biometrics:    Nutrition Therapy Plan and Nutrition Goals:     Nutrition Therapy & Goals - 09/05/15 1327    Nutrition Therapy   Diet Diabetic, Renal   Drug/Food Interactions Coumadin/Vit K   Personal Nutrition Goals   Personal Goal #1 CBG's in the normal range or as close to normal as is safely possible.   Intervention Plan   Intervention Prescribe, educate and counsel regarding individualized specific dietary modifications aiming towards targeted core components such as weight, hypertension, lipid management, diabetes, heart failure and other comorbidities.   Expected Outcomes Short Term Goal: Understand basic principles of dietary  content, such as calories, fat, sodium, cholesterol and nutrients.;Long Term Goal: Adherence to prescribed nutrition plan.      Nutrition Discharge: Rate Your Plate Scores:     Nutrition Assessments - 09/05/15 1324    Rate Your Plate Scores   Pre Score 50      Psychosocial: Target Goals: Acknowledge presence or absence of depression, maximize coping skills, provide positive support system. Participant is able to verbalize types and ability to use techniques and skills needed for reducing stress and depression.  Initial Review & Psychosocial Screening:   Quality of Life Scores:   PHQ-9:     Recent Review Flowsheet Data    Depression screen [facility] 2/9 08/02/2015 05/17/2014   Decreased Interest 0 0   Down, Depressed, Hopeless 0 0   PHQ - 2 Score 0 0      Psychosocial Evaluation and Intervention:   Psychosocial Re-Evaluation:     Psychosocial Re-Evaluation      09/03/15 1453 10/03/15 1141 10/29/15 0846       Psychosocial Re-Evaluation   Interventions Encouraged to attend Pulmonary Rehabilitation for the exercise Encouraged to attend Pulmonary Rehabilitation for the exercise Encouraged to attend Pulmonary Rehabilitation for the exercise     Comments patient continues to suffer from short term memory loss. He initially had trouble adhering to pulmonary rehab diabetic protocol. He has done much better with checking his bloodsugars prior to and post exercise with prompting and with less resistance.  patient has adjusted well to pulmonary rehab. He has now adapted to the daily routien and interactive during education class. His wife states he is now excited about pulmonary rehab and feels he is making progress towards his rehab goals. patient has requested to be discharged early from pulmonary rehab. He recieves dialysis 3 days a week and states attending pulmonary rehab 2 days in addition is physically exhausting. He has made significant improvements in his stamina and strength while  in the program     Continued Psychosocial Services Needed  Yes        Education: Education Goals: Education classes will be provided on a weekly basis, covering required topics. Participant will state understanding/return demonstration of topics presented.  Learning Barriers/Preferences:   Education Topics: Risk Factor Reduction:  -Group instruction that is supported by a PowerPoint presentation. Instructor discusses the definition of a risk factor, different risk factors for pulmonary disease, and how the heart and lungs work together.     Nutrition for Pulmonary Patient:  -  Group instruction provided by PowerPoint slides, verbal discussion, and written materials to support subject matter. The instructor gives an explanation and review of healthy diet recommendations, which includes a discussion on weight management, recommendations for fruit and vegetable consumption, as well as protein, fluid, caffeine, fiber, sodium, sugar, and alcohol. Tips for eating when patients are short of breath are discussed.          PULMONARY REHAB OTHER RESPIRATORY from 10/24/2015 in [place]   Date  09/19/15   Educator  RD   Instruction Review Code  2- meets goals/outcomes      Pursed Lip Breathing:  -Group instruction that is supported by demonstration and informational handouts. Instructor discusses the benefits of pursed lip and diaphragmatic breathing and detailed demonstration on how to preform both.        PULMONARY REHAB OTHER RESPIRATORY from 10/24/2015 in [place]   Date  09/12/15   Educator  Cloyde Reams diVincenzo   Instruction Review Code  2- meets goals/outcomes      Oxygen Safety:  -Group instruction provided by PowerPoint, verbal discussion, and written material to support subject matter. There is an overview of "What is Oxygen" and "Why do we need it".  Instructor also reviews how to create a safe environment for oxygen use,  the importance of using oxygen as prescribed, and the risks of noncompliance. There is a brief discussion on traveling with oxygen and resources the patient may utilize.      PULMONARY REHAB OTHER RESPIRATORY from 10/24/2015 in [place]   Date  10/03/15   Educator  rn   Instruction Review Code  2- meets goals/outcomes      Oxygen Equipment:  -Group instruction provided by [employer] Staff utilizing handouts, written materials, and equipment demonstrations.      PULMONARY REHAB OTHER RESPIRATORY from 10/24/2015 in [place]   Date  10/17/15   Educator  [employer] rep   Instruction Review Code  2- meets goals/outcomes      Signs and Symptoms:  -Group instruction provided by written material and verbal discussion to support subject matter. Warning signs and symptoms of infection, stroke, and heart attack are reviewed and when to call the physician/911 reinforced. Tips for preventing the spread of infection discussed.   Advanced Directives:  -Group instruction provided by verbal instruction and written material to support subject matter. Instructor reviews Advanced Directive laws and proper instruction for filling out document.      PULMONARY REHAB OTHER RESPIRATORY from 10/24/2015 in [place]   Date  10/10/15   Educator  rt   Instruction Review Code  2- meets goals/outcomes      Pulmonary Video:  -Group video education that reviews the importance of medication and oxygen compliance, exercise, good nutrition, pulmonary hygiene, and pursed lip and diaphragmatic breathing for the pulmonary patient.      PULMONARY REHAB OTHER RESPIRATORY from 10/24/2015 in [place]   Date  09/26/15   Instruction Review Code  2- meets goals/outcomes      Exercise for the Pulmonary Patient:  -Group instruction that is supported by a PowerPoint presentation. Instructor discusses  benefits of exercise, core components of exercise, frequency, duration, and intensity of an exercise routine, importance of utilizing pulse oximetry during exercise, safety while exercising, and options of places to exercise outside of rehab.  PULMONARY REHAB OTHER RESPIRATORY from 10/24/2015 in [place]   Date  10/24/15   Educator  EP   Instruction Review Code  2- meets goals/outcomes      Pulmonary Medications:  -Verbally interactive group education provided by instructor with focus on inhaled medications and proper administration.   Anatomy and Physiology of the Respiratory System and Intimacy:  -Group instruction provided by PowerPoint, verbal discussion, and written material to support subject matter. Instructor reviews respiratory cycle and anatomical components of the respiratory system and their functions. Instructor also reviews differences in obstructive and restrictive respiratory diseases with examples of each. Intimacy, Sex, and Sexuality differences are reviewed with a discussion on how relationships can change when diagnosed with pulmonary disease. Common sexual concerns are reviewed.      PULMONARY REHAB OTHER RESPIRATORY from 10/24/2015 in [place]   Date  08/29/15   Educator  -- [RN]   Instruction Review Code  2- meets goals/outcomes      Knowledge Questionnaire Score:   Core Components/Risk Factors/Patient Goals at Admission:   Core Components/Risk Factors/Patient Goals Review:      Goals and Risk Factor Review      09/05/15 0849 09/05/15 1325 10/03/15 1143 10/29/15 0837     Core Components/Risk Factors/Patient Goals Review   Personal Goals Review Improve shortness of breath with ADL's;Increase knowledge of respiratory medications and ability to use respiratory devices properly.;Hypertension Diabetes Weight Management/Obesity;Increase Strength and Stamina;Improve shortness of breath with  ADL's;Increase knowledge of respiratory medications and ability to use respiratory devices properly.;Develop more efficient breathing techniques such as purse lipped breathing and diaphragmatic breathing and practicing self-pacing with activity.;Diabetes;Hypertension Weight Management/Obesity;Increase Strength and Stamina;Improve shortness of breath with ADL's;Increase knowledge of respiratory medications and ability to use respiratory devices properly.;Develop more efficient breathing techniques such as purse lipped breathing and diaphragmatic breathing and practicing self-pacing with activity.;Diabetes;Hypertension    Review see "comments" section on ITP see 09/05/15 RD note patient is doing extremely well in rehab. He is toleratting increased workloads on equiptment. His stamina and strength is improving and he states his shortness of breath is decreasing with activity. patient is doing extremely well in rehab. He is toleratting increased workloads on equiptment. His stamina and strength is improving and he states his shortness of breath is decreasing with activity.    Expected Outcomes see Admission expected outcomes see Admission expected outcomes see Admission expected outcomes see Admission expected outcomes       Core Components/Risk Factors/Patient Goals at Discharge (Final Review):      Goals and Risk Factor Review - 10/29/15 0837    Core Components/Risk Factors/Patient Goals Review   Personal Goals Review Weight Management/Obesity;Increase Strength and Stamina;Improve shortness of breath with ADL's;Increase knowledge of respiratory medications and ability to use respiratory devices properly.;Develop more efficient breathing techniques such as purse lipped breathing and diaphragmatic breathing and practicing self-pacing with activity.;Diabetes;Hypertension   Review patient is doing extremely well in rehab. He is toleratting increased workloads on equiptment. His stamina and strength is improving  and he states his shortness of breath is decreasing with activity.   Expected Outcomes see Admission expected outcomes      ITP Comments:   Comments: ITP REVIEW Pt is making expected progress toward personal goals after completing 18 sessions. Recommend continued exercise, life style modification, education, and utilization of breathing techniques to increase stamina and strength and decrease shortness of breath with exertion.

## 2015-11-04 ENCOUNTER — Ambulatory Visit (INDEPENDENT_AMBULATORY_CARE_PROVIDER_SITE_OTHER): Payer: 59 | Admitting: *Deleted

## 2015-11-04 DIAGNOSIS — I482 Chronic atrial fibrillation, unspecified: Secondary | ICD-10-CM

## 2015-11-04 DIAGNOSIS — I4891 Unspecified atrial fibrillation: Secondary | ICD-10-CM | POA: Diagnosis not present

## 2015-11-04 DIAGNOSIS — I4821 Permanent atrial fibrillation: Secondary | ICD-10-CM

## 2015-11-04 LAB — PROTIME-INR
INR: 8.49 — AB (ref 0.00–1.49)
PROTHROMBIN TIME: 66.9 s — AB (ref 11.6–15.2)

## 2015-11-04 LAB — POCT INR: INR: 8

## 2015-11-05 ENCOUNTER — Encounter (HOSPITAL_COMMUNITY): Payer: 59

## 2015-11-07 ENCOUNTER — Encounter (HOSPITAL_COMMUNITY): Payer: 59

## 2015-11-08 ENCOUNTER — Ambulatory Visit (INDEPENDENT_AMBULATORY_CARE_PROVIDER_SITE_OTHER): Payer: 59 | Admitting: *Deleted

## 2015-11-08 DIAGNOSIS — I4821 Permanent atrial fibrillation: Secondary | ICD-10-CM

## 2015-11-08 DIAGNOSIS — I482 Chronic atrial fibrillation, unspecified: Secondary | ICD-10-CM

## 2015-11-08 LAB — POCT INR: INR: 2.6

## 2015-11-12 ENCOUNTER — Encounter (HOSPITAL_COMMUNITY): Payer: 59

## 2015-11-13 ENCOUNTER — Ambulatory Visit (INDEPENDENT_AMBULATORY_CARE_PROVIDER_SITE_OTHER): Payer: Medicare Other | Admitting: *Deleted

## 2015-11-13 DIAGNOSIS — I482 Chronic atrial fibrillation, unspecified: Secondary | ICD-10-CM

## 2015-11-13 DIAGNOSIS — I4821 Permanent atrial fibrillation: Secondary | ICD-10-CM

## 2015-11-13 LAB — POCT INR: INR: 2.9

## 2015-11-14 ENCOUNTER — Encounter (HOSPITAL_COMMUNITY): Payer: 59

## 2015-11-19 ENCOUNTER — Encounter (HOSPITAL_COMMUNITY): Admission: RE | Admit: 2015-11-19 | Payer: 59 | Source: Ambulatory Visit

## 2015-11-21 ENCOUNTER — Encounter (HOSPITAL_COMMUNITY): Payer: 59

## 2015-12-10 ENCOUNTER — Ambulatory Visit: Payer: Medicare Other | Admitting: Neurology

## 2015-12-19 NOTE — Addendum Note (Signed)
Encounter addended by: Jewel Baize, RD on: 12/19/2015  3:15 PM<BR>    Actions taken: Flowsheet data copied forward, Visit Navigator Flowsheet section accepted

## 2015-12-20 DEATH — deceased

## 2015-12-24 ENCOUNTER — Ambulatory Visit: Payer: Medicare Other | Admitting: Sports Medicine

## 2016-01-06 ENCOUNTER — Ambulatory Visit: Payer: Medicare Other | Admitting: Sports Medicine

## 2016-07-25 IMAGING — MR MR HEAD W/O CM
9 of 11 series · 35 of 48 positions shown · non-contrast
Comparison: CT head without contrast from the same day.

CLINICAL DATA: Altered mental status. Encephalopathy. Atrial
fibrillation.

EXAM:
MRI HEAD WITHOUT CONTRAST
TECHNIQUE: Multiplanar, multiecho pulse sequences of the brain and surrounding
structures were obtained without intravenous contrast.

[Series 4: DWI · axial · 3.0mm · 1.09mm/px · z∈[-29,+118]mm · 8 of 102 slices shown (1 of 4)]
[im 1/102]
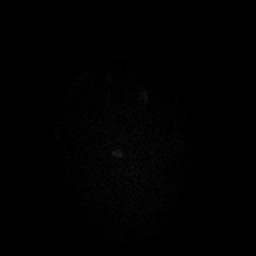
[im 15/102]
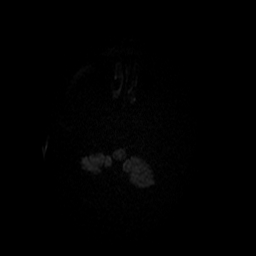
[im 29/102]
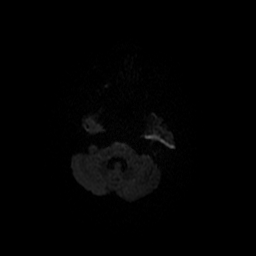
[im 44/102]
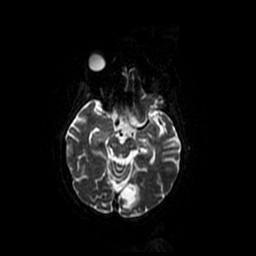
[im 58/102]
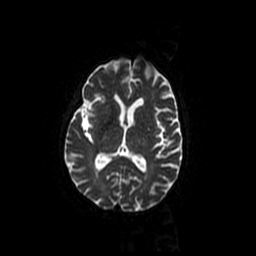
[im 73/102]
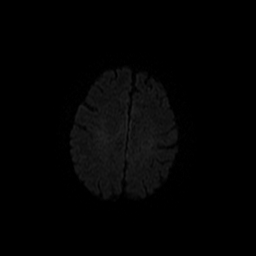
[im 87/102]
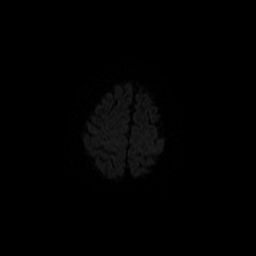
[im 102/102]
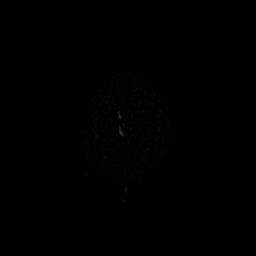

[Series 5: T1 · sagittal · 5.0mm · 0.47mm/px · 1 of 24 slices shown]
[im 1/24]
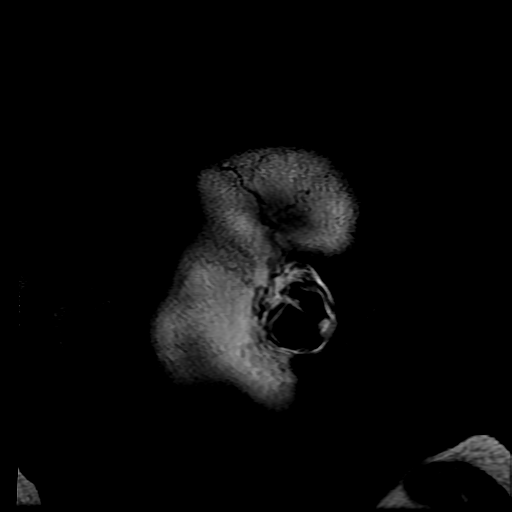

[Series 6: T2 · axial · 5.0mm · 0.43mm/px · z∈[-43,+115]mm · 3 of 28 slices shown (1 of 3)]
[im 1/28]
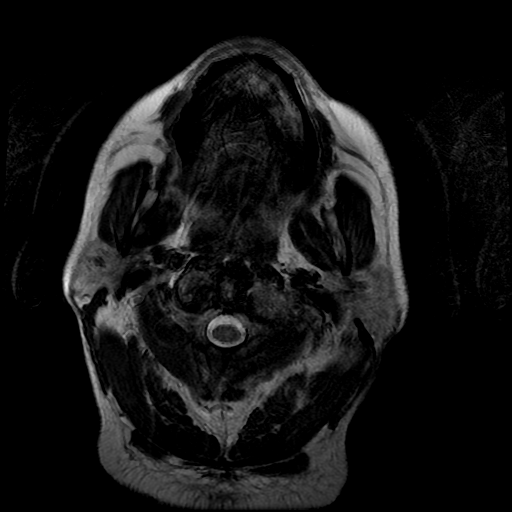
[im 14/28]
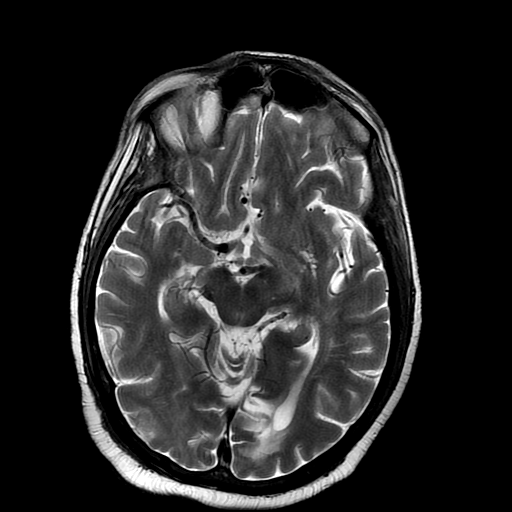
[im 28/28]
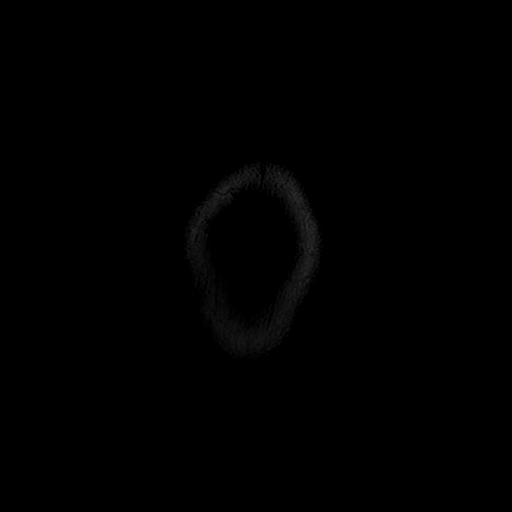

[Series 7: DWI · coronal · 5.0mm · 1.09mm/px · 7 of 74 slices shown (2 of 4)]
[im 1/74]
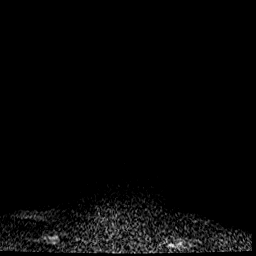
[im 13/74]
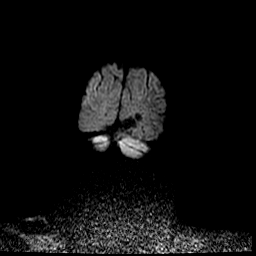
[im 25/74]
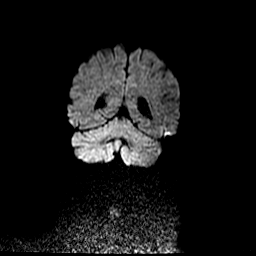
[im 37/74]
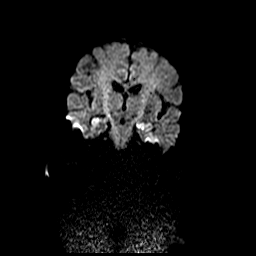
[im 49/74]
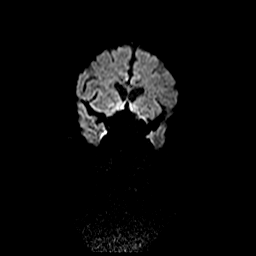
[im 61/74]
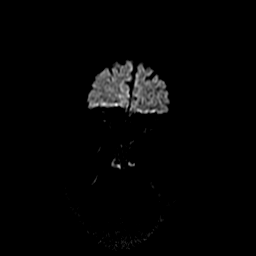
[im 74/74]
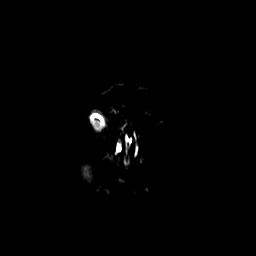

[Series 8: FLAIR · axial · 5.0mm · 0.43mm/px · z∈[-43,+117]mm · 3 of 28 slices shown]
[im 1/28]
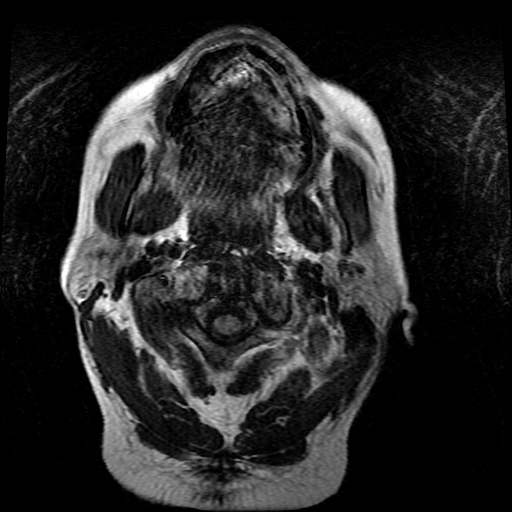
[im 14/28]
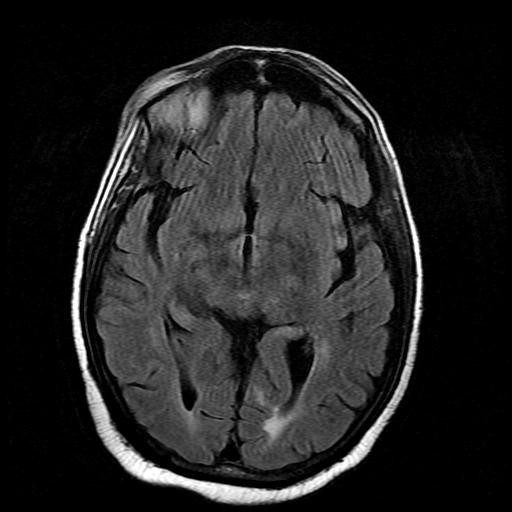
[im 28/28]
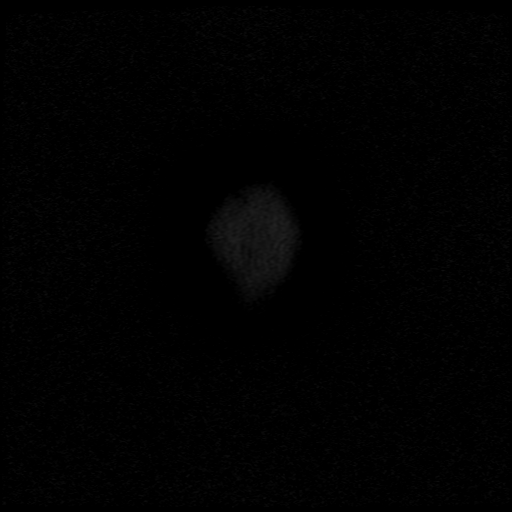

[Series 11: T2 · coronal · 5.0mm · 0.39mm/px · 3 of 30 slices shown (2 of 3)]
[im 1/30]
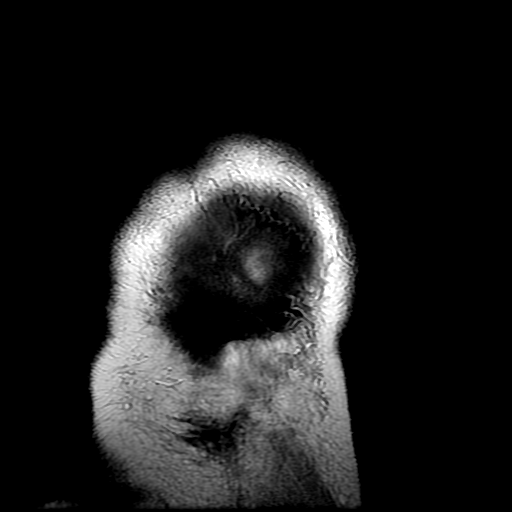
[im 15/30]
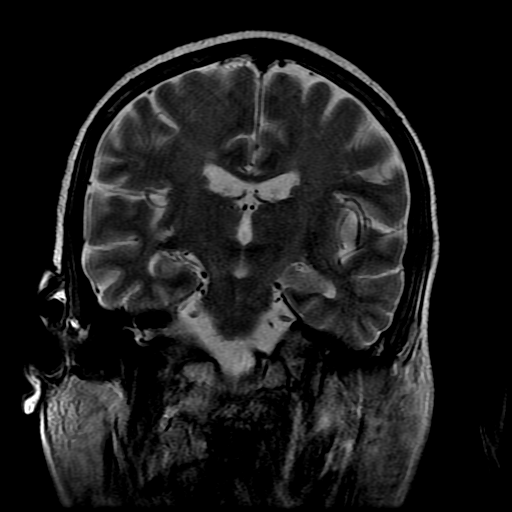
[im 30/30]
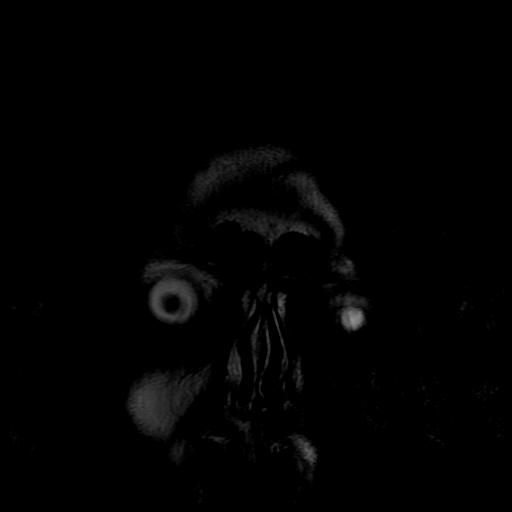

[Series 12: T2 · coronal · 3.0mm · 0.35mm/px · 2 of 21 slices shown (3 of 3)]
[im 1/21]
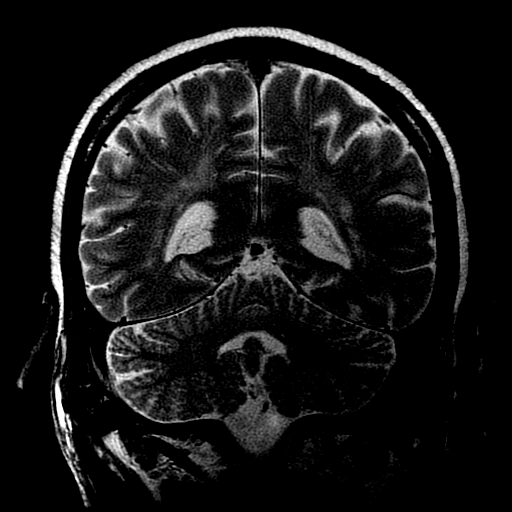
[im 21/21]
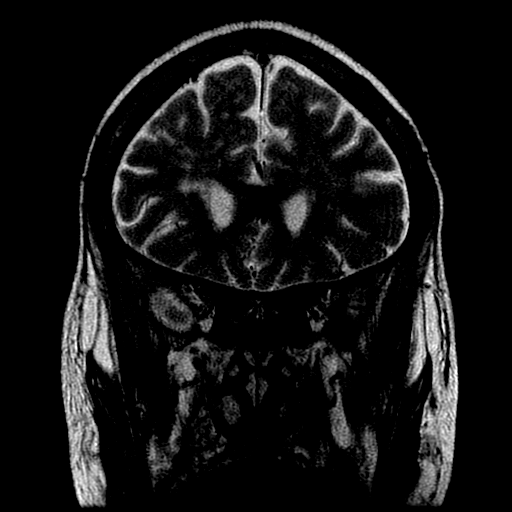

[Series 400: DWI · axial · 3.0mm · 1.09mm/px · z∈[-29,+118]mm · 5 of 51 slices shown (3 of 4)]
[im 1/51]
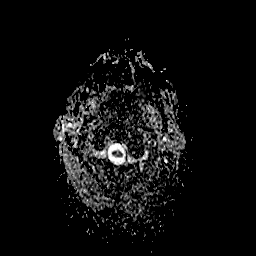
[im 13/51]
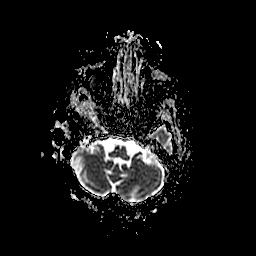
[im 26/51]
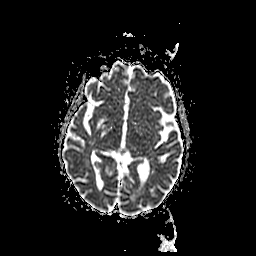
[im 38/51]
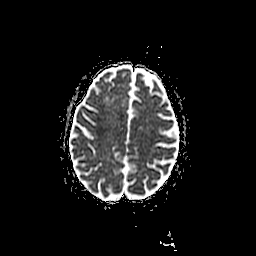
[im 51/51]
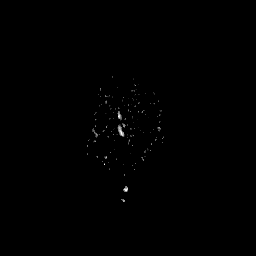

[Series 700: DWI · coronal · 5.0mm · 1.09mm/px · 3 of 37 slices shown (4 of 4)]
[im 1/37]
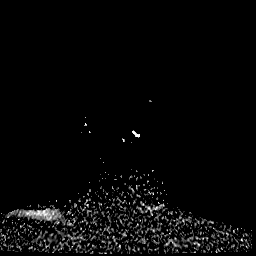
[im 19/37]
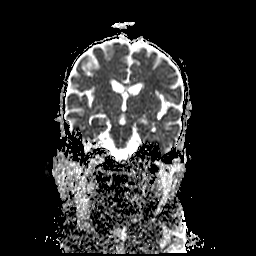
[im 37/37]
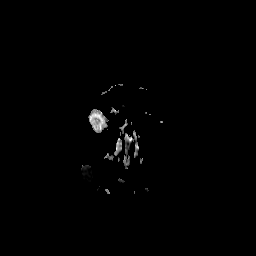

[35 of 48 positions shown; findings below may reference images not displayed]

FINDINGS: Restricted diffusion and increased T2 signal is present within the
medial temporal lobes and hippocampal structures bilaterally.
Inferior temporal lobes are intact.

Moderate generalized atrophy and diffuse periventricular T2 changes
bilaterally are advanced for age. The ventricles are of normal size.

Flow is present in the major intracranial arteries. The globes and
orbits are intact. The paranasal sinuses are clear. There is some
fluid in the right mastoid air cells. No obstructing nasopharyngeal
lesion is evident.
IMPRESSION: Abnormal T2 signal and restricted diffusion within the medial
temporal lobes and hippocampus bilaterally. This raises the
possibility of a limbic encephalitis. There is no hemorrhage or
signal change along the undersurface the temporal lobes, but herpes
encephalitis is also considered. Status epilepticus is also on the
differential diagnosis.

Atrophy in T2 signal changes are advanced for age. This is
nonspecific, but likely reflects the sequela of chronic
microvascular ischemia.

These results will be called to the ordering clinician or
representative by the Radiologist Assistant, and communication
documented in the PACS or zVision Dashboard.

## 2016-07-30 IMAGING — XA IR US GUIDE VASC ACCESS RIGHT
1 series · 2 of 2 positions shown · non-contrast
Comparison: none

CLINICAL DATA: Atrial fibrillation

[Series 1: run · 2 of 2 slices shown]
[im 1/2]
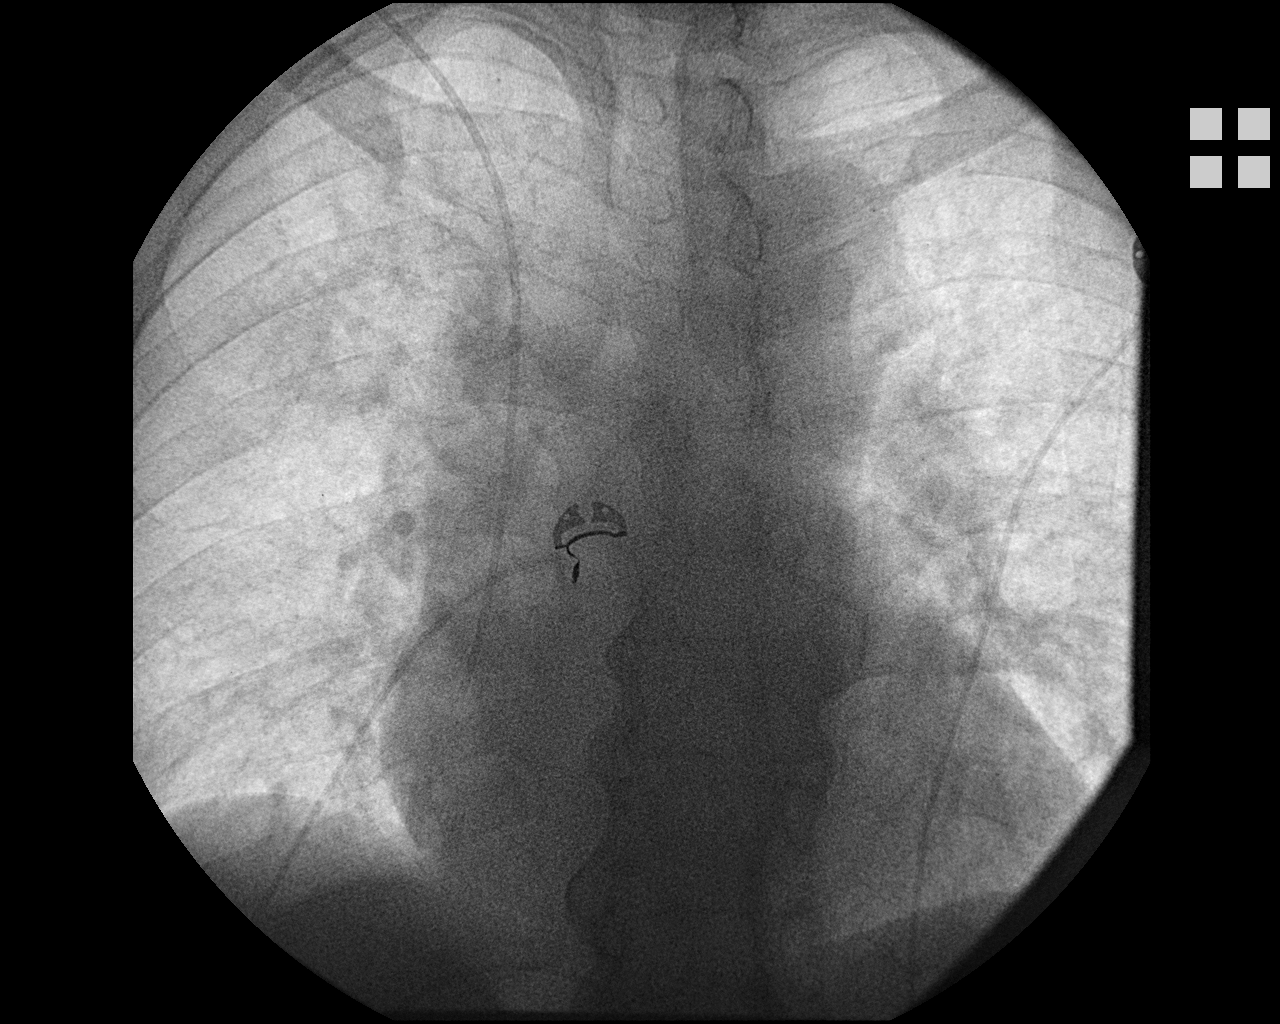
[im 2/2]
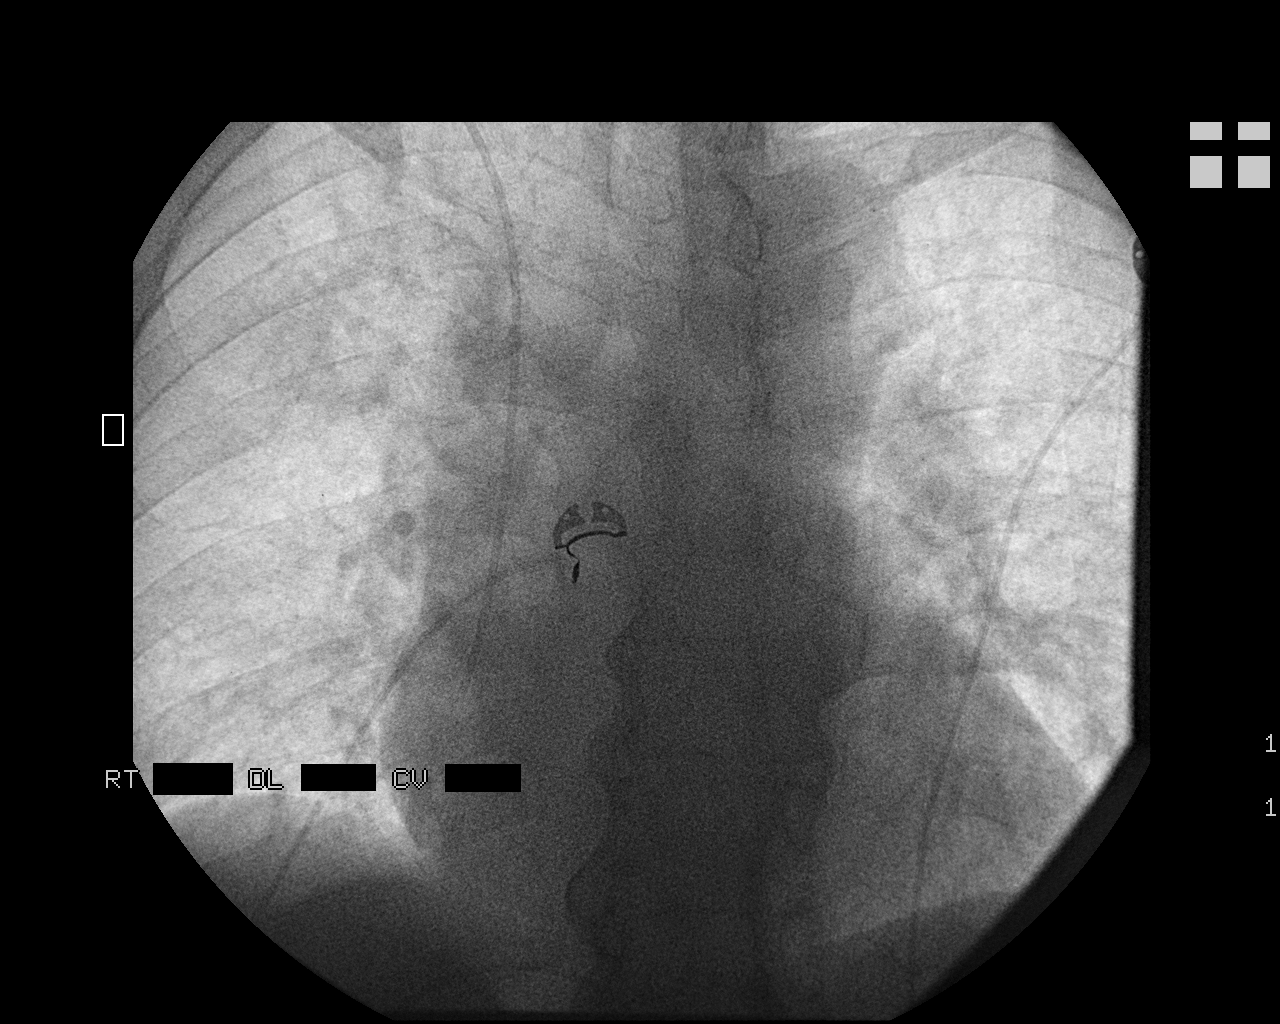

[2 of 2 positions shown; findings below may reference images not displayed]

EXAM:
RIGHT JUGULAR TUNNELED PICC LINE PLACEMENT WITH ULTRASOUND AND
FLUOROSCOPIC GUIDANCE

FLUOROSCOPY TIME:  18 seconds in

PROCEDURE:
The patient was advised of the possible risks andcomplications and
agreed to undergo the procedure. The patient was then brought to the
angiographic suite for the procedure.

The right neck was prepped with chlorhexidine, drapedin the usual
sterile fashion using maximum barrier technique (cap and mask,
sterile gown, sterile gloves, large sterile sheet, hand hygiene and
cutaneous antisepsis) and infiltrated locally with 1% Lidocaine.

Ultrasound demonstrated patency of the right internal jugular vein,
and this was documented with an image. Under real-time ultrasound
guidance, this vein was accessed with a 21 gauge micropuncture
needle and image documentation was performed. A [DATE] wire was
introduced in to the vein. Over this, a 5 French double lumen Power
tunneled PICC was advanced to the lower SVC/right atrial junction.
The cuff was positioned in the subcutaneous tract. Fluoroscopy
during the procedure and fluoro spot radiograph confirms appropriate
catheter position. The catheter was flushed and covered with
asterile dressing.

Catheter length: 21 cm

COMPLICATIONS:
None
IMPRESSION: Successful right jugular tunneled power PICC line placement with
ultrasound and fluoroscopic guidance. The catheter is ready for use.

## 2016-08-03 IMAGING — CR DG CHEST 1V PORT
1 series · 1 of 1 positions shown · non-contrast
Comparison: Portable exam 4435 hours compared to 11/18/2014

CLINICAL DATA: Cough, shortness of breath, history hypertension,
diabetes mellitus, CHF, chronic kidney disease, atrial fibrillation

EXAM:
PORTABLE CHEST - 1 VIEW

[AP]
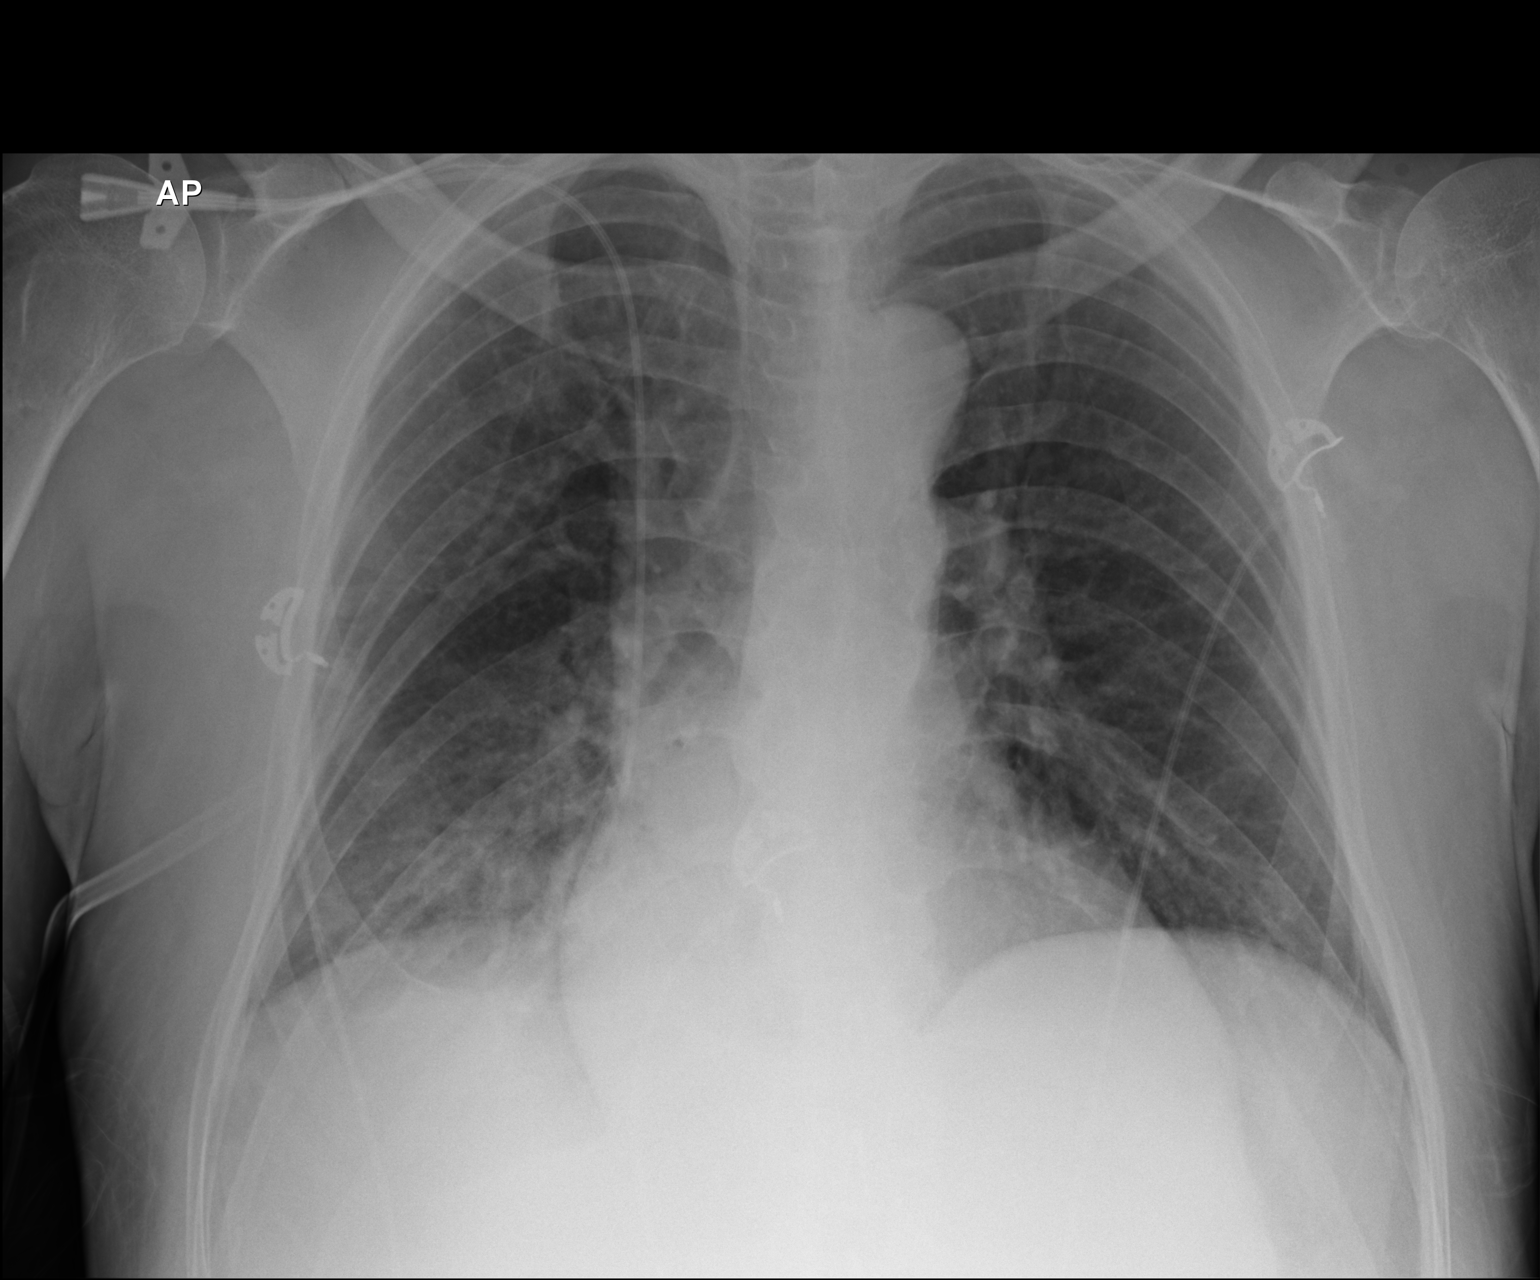

[1 of 1 positions shown; findings below may reference images not displayed]

FINDINGS: RIGHT jugular central venous catheter with tip projecting over SVC
near cavoatrial junction.

Upper normal heart size.

Mediastinal contours and pulmonary vascularity normal.

Infiltrate RIGHT lower lobe.

Remaining lungs clear.

No pleural effusion or pneumothorax.
IMPRESSION: RIGHT lower lobe infiltrate consistent with pneumonia.

No pneumothorax following central line placement.

## 2016-08-19 IMAGING — CR DG CHEST 1V PORT
1 series · 1 of 1 positions shown · non-contrast
Comparison: Portable exam 4631 hours compared 12/12/2014

CLINICAL DATA: Pneumonia, persistent leukocytosis, history atrial
fibrillation, hypertension, diabetes mellitus, CHF, former smoker,
end-stage renal disease on dialysis

EXAM:
PORTABLE CHEST - 1 VIEW

[AP]
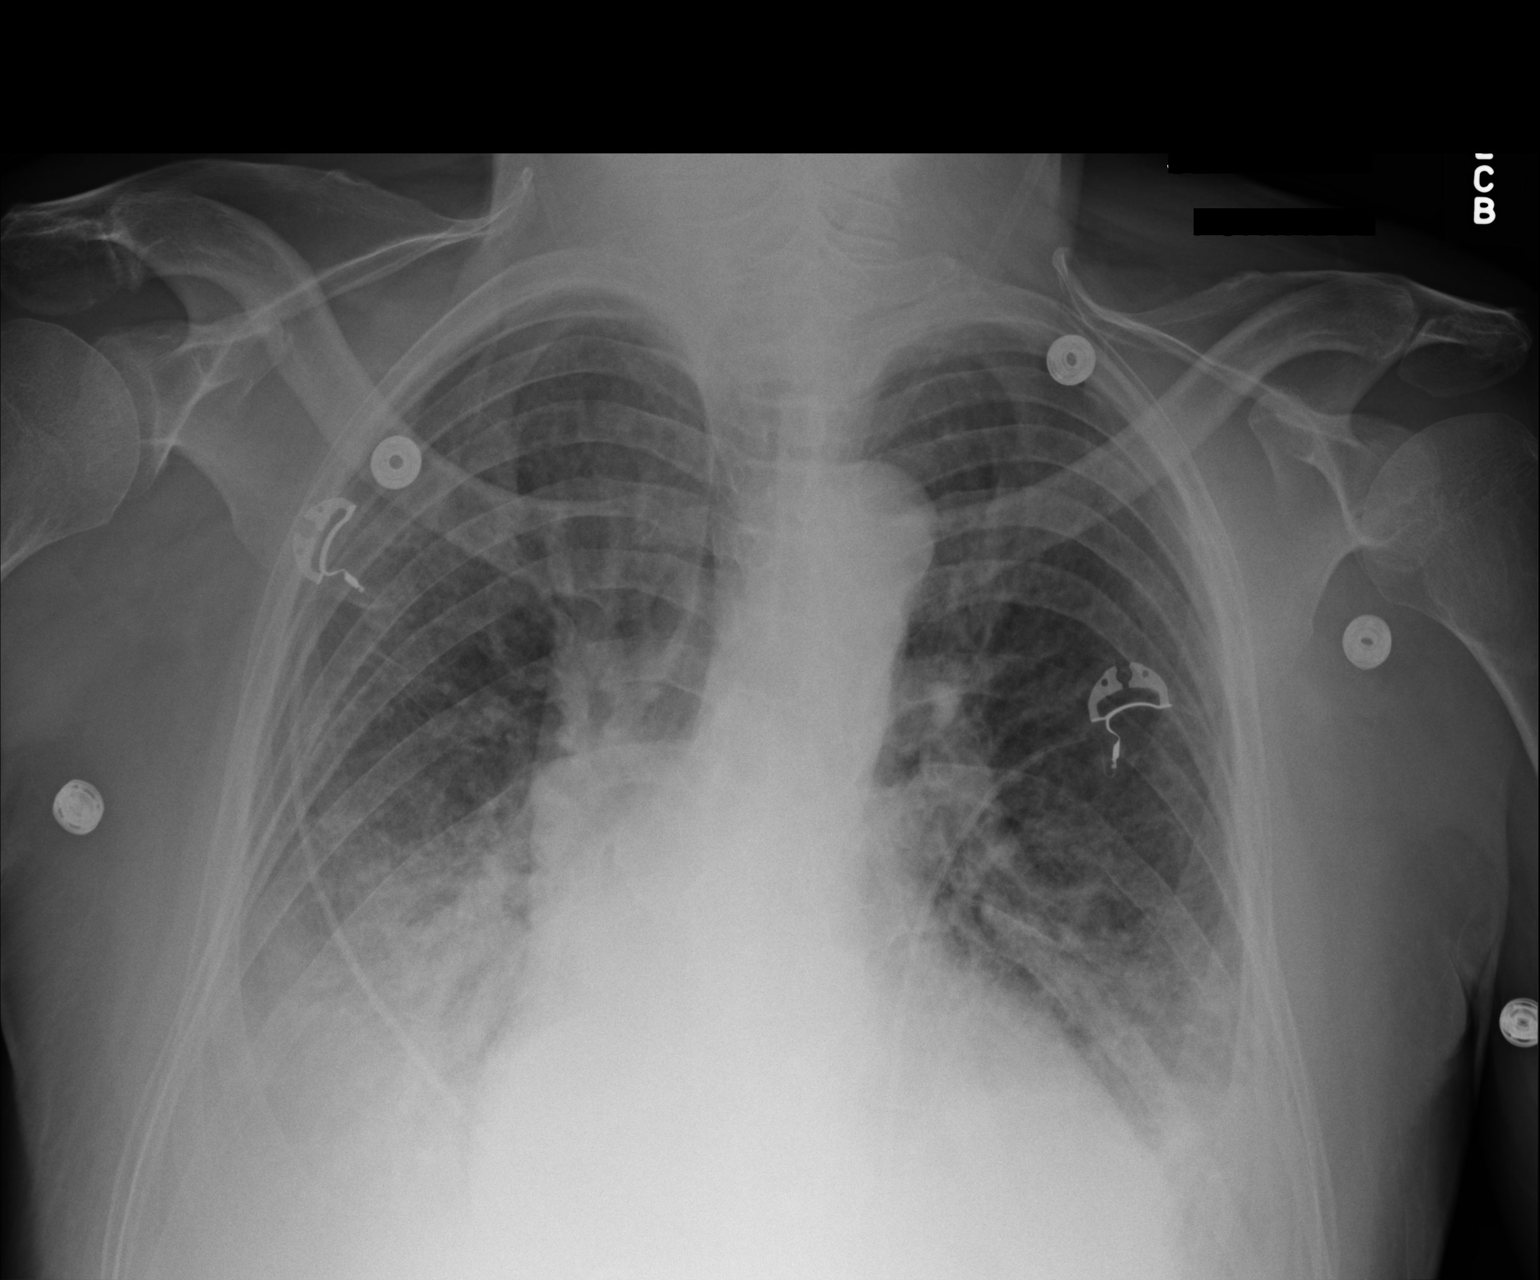

[1 of 1 positions shown; findings below may reference images not displayed]

FINDINGS: Enlargement of cardiac silhouette with vascular congestion.

Mediastinal contour stable.

BILATERAL perihilar and basilar infiltrates question pulmonary edema
versus infection.

Probable bibasilar effusions.

No pneumothorax.
IMPRESSION: Enlargement of cardiac silhouette with pulmonary vascular
congestion.

Perihilar and basilar infiltrates with associated pleural effusions,
question pulmonary edema versus pneumonia.
